# Patient Record
Sex: Male | Born: 1950 | Race: Black or African American | Hispanic: No | Marital: Married | Smoking: Never smoker
Health system: Southern US, Community
[De-identification: ages and names within clinical notes are randomized; demographics above are authoritative.]

## PROBLEM LIST (undated history)

## (undated) DIAGNOSIS — K219 Gastro-esophageal reflux disease without esophagitis: Secondary | ICD-10-CM

## (undated) DIAGNOSIS — I252 Old myocardial infarction: Secondary | ICD-10-CM

## (undated) DIAGNOSIS — F419 Anxiety disorder, unspecified: Secondary | ICD-10-CM

## (undated) DIAGNOSIS — Z8616 Personal history of COVID-19: Secondary | ICD-10-CM

## (undated) DIAGNOSIS — Z923 Personal history of irradiation: Secondary | ICD-10-CM

## (undated) DIAGNOSIS — I5022 Chronic systolic (congestive) heart failure: Secondary | ICD-10-CM

## (undated) DIAGNOSIS — M1990 Unspecified osteoarthritis, unspecified site: Secondary | ICD-10-CM

## (undated) DIAGNOSIS — R441 Visual hallucinations: Secondary | ICD-10-CM

## (undated) DIAGNOSIS — I472 Ventricular tachycardia, unspecified: Secondary | ICD-10-CM

## (undated) DIAGNOSIS — I428 Other cardiomyopathies: Secondary | ICD-10-CM

## (undated) DIAGNOSIS — C61 Malignant neoplasm of prostate: Secondary | ICD-10-CM

## (undated) DIAGNOSIS — Z950 Presence of cardiac pacemaker: Secondary | ICD-10-CM

## (undated) DIAGNOSIS — G4730 Sleep apnea, unspecified: Secondary | ICD-10-CM

## (undated) DIAGNOSIS — E785 Hyperlipidemia, unspecified: Secondary | ICD-10-CM

## (undated) DIAGNOSIS — K635 Polyp of colon: Secondary | ICD-10-CM

## (undated) DIAGNOSIS — I499 Cardiac arrhythmia, unspecified: Secondary | ICD-10-CM

## (undated) DIAGNOSIS — F411 Generalized anxiety disorder: Secondary | ICD-10-CM

## (undated) DIAGNOSIS — Z95810 Presence of automatic (implantable) cardiac defibrillator: Secondary | ICD-10-CM

## (undated) DIAGNOSIS — I10 Essential (primary) hypertension: Secondary | ICD-10-CM

## (undated) HISTORY — DX: Hyperlipidemia, unspecified: E78.5

## (undated) HISTORY — DX: Visual hallucinations: R44.1

## (undated) HISTORY — DX: Gastro-esophageal reflux disease without esophagitis: K21.9

## (undated) HISTORY — DX: Other cardiomyopathies: I42.8

## (undated) HISTORY — DX: Unspecified osteoarthritis, unspecified site: M19.90

## (undated) HISTORY — PX: COLONOSCOPY W/ BIOPSIES AND POLYPECTOMY: SHX1376

## (undated) HISTORY — DX: Generalized anxiety disorder: F41.1

## (undated) HISTORY — DX: Personal history of COVID-19: Z86.16

## (undated) HISTORY — DX: Chronic systolic (congestive) heart failure: I50.22

## (undated) HISTORY — DX: Anxiety disorder, unspecified: F41.9

## (undated) HISTORY — DX: Ventricular tachycardia, unspecified: I47.20

## (undated) HISTORY — DX: Ventricular tachycardia: I47.2

## (undated) HISTORY — DX: Polyp of colon: K63.5

## (undated) HISTORY — PX: UPPER GASTROINTESTINAL ENDOSCOPY: SHX188

## (undated) HISTORY — PX: CATARACT EXTRACTION: SUR2

## (undated) HISTORY — DX: Sleep apnea, unspecified: G47.30

## (undated) HISTORY — PX: RECTAL SURGERY: SHX760

---

## 2003-05-10 ENCOUNTER — Ambulatory Visit: Admission: RE | Admit: 2003-05-10 | Discharge: 2003-05-10 | Payer: Self-pay | Admitting: Urology

## 2003-05-11 ENCOUNTER — Ambulatory Visit (HOSPITAL_COMMUNITY): Admission: RE | Admit: 2003-05-11 | Discharge: 2003-05-11 | Payer: Self-pay | Admitting: Cardiology

## 2003-05-24 ENCOUNTER — Encounter (INDEPENDENT_AMBULATORY_CARE_PROVIDER_SITE_OTHER): Payer: Self-pay | Admitting: Cardiology

## 2003-05-24 ENCOUNTER — Ambulatory Visit: Admission: RE | Admit: 2003-05-24 | Discharge: 2003-05-24 | Payer: Self-pay | Admitting: Cardiology

## 2003-06-12 DIAGNOSIS — C61 Malignant neoplasm of prostate: Secondary | ICD-10-CM

## 2003-06-12 HISTORY — PX: PROSTATECTOMY: SHX69

## 2003-06-12 HISTORY — DX: Malignant neoplasm of prostate: C61

## 2003-06-29 ENCOUNTER — Inpatient Hospital Stay (HOSPITAL_COMMUNITY): Admission: RE | Admit: 2003-06-29 | Discharge: 2003-07-03 | Payer: Self-pay | Admitting: Urology

## 2003-06-29 ENCOUNTER — Encounter (INDEPENDENT_AMBULATORY_CARE_PROVIDER_SITE_OTHER): Payer: Self-pay | Admitting: *Deleted

## 2003-09-27 ENCOUNTER — Inpatient Hospital Stay (HOSPITAL_BASED_OUTPATIENT_CLINIC_OR_DEPARTMENT_OTHER): Admission: RE | Admit: 2003-09-27 | Discharge: 2003-09-27 | Payer: Self-pay | Admitting: Cardiology

## 2004-02-05 ENCOUNTER — Ambulatory Visit (HOSPITAL_BASED_OUTPATIENT_CLINIC_OR_DEPARTMENT_OTHER): Admission: RE | Admit: 2004-02-05 | Discharge: 2004-02-05 | Payer: Self-pay | Admitting: Nephrology

## 2004-03-11 ENCOUNTER — Observation Stay (HOSPITAL_COMMUNITY): Admission: RE | Admit: 2004-03-11 | Discharge: 2004-03-12 | Payer: Self-pay | Admitting: Internal Medicine

## 2004-03-11 ENCOUNTER — Ambulatory Visit: Payer: Self-pay | Admitting: Internal Medicine

## 2004-03-11 HISTORY — PX: CARDIAC DEFIBRILLATOR PLACEMENT: SHX171

## 2004-04-07 ENCOUNTER — Ambulatory Visit: Payer: Self-pay

## 2004-08-01 ENCOUNTER — Ambulatory Visit: Payer: Self-pay | Admitting: Internal Medicine

## 2004-08-20 ENCOUNTER — Ambulatory Visit: Payer: Self-pay | Admitting: Internal Medicine

## 2004-09-05 ENCOUNTER — Ambulatory Visit: Payer: Self-pay | Admitting: Internal Medicine

## 2004-09-05 DIAGNOSIS — K2270 Barrett's esophagus without dysplasia: Secondary | ICD-10-CM

## 2004-09-05 DIAGNOSIS — Z860100 Personal history of colon polyps, unspecified: Secondary | ICD-10-CM | POA: Insufficient documentation

## 2004-09-05 HISTORY — DX: Barrett's esophagus without dysplasia: K22.70

## 2004-09-16 ENCOUNTER — Ambulatory Visit: Payer: Self-pay | Admitting: Internal Medicine

## 2006-08-24 ENCOUNTER — Ambulatory Visit: Payer: Self-pay | Admitting: Internal Medicine

## 2007-01-03 ENCOUNTER — Ambulatory Visit: Payer: Self-pay

## 2007-01-11 ENCOUNTER — Ambulatory Visit: Payer: Self-pay

## 2007-03-02 ENCOUNTER — Ambulatory Visit: Payer: Self-pay

## 2007-03-11 ENCOUNTER — Encounter: Admission: RE | Admit: 2007-03-11 | Discharge: 2007-03-11 | Payer: Self-pay | Admitting: Cardiology

## 2007-03-14 ENCOUNTER — Ambulatory Visit: Payer: Self-pay | Admitting: Internal Medicine

## 2009-08-12 ENCOUNTER — Encounter (INDEPENDENT_AMBULATORY_CARE_PROVIDER_SITE_OTHER): Payer: Self-pay | Admitting: *Deleted

## 2009-08-26 ENCOUNTER — Ambulatory Visit: Payer: Self-pay | Admitting: Internal Medicine

## 2009-08-26 DIAGNOSIS — Z8546 Personal history of malignant neoplasm of prostate: Secondary | ICD-10-CM

## 2009-08-26 DIAGNOSIS — I10 Essential (primary) hypertension: Secondary | ICD-10-CM

## 2009-08-26 DIAGNOSIS — E1169 Type 2 diabetes mellitus with other specified complication: Secondary | ICD-10-CM | POA: Insufficient documentation

## 2009-08-26 DIAGNOSIS — E119 Type 2 diabetes mellitus without complications: Secondary | ICD-10-CM

## 2009-08-26 DIAGNOSIS — Z95810 Presence of automatic (implantable) cardiac defibrillator: Secondary | ICD-10-CM

## 2009-08-26 DIAGNOSIS — I152 Hypertension secondary to endocrine disorders: Secondary | ICD-10-CM | POA: Insufficient documentation

## 2009-08-26 DIAGNOSIS — E785 Hyperlipidemia, unspecified: Secondary | ICD-10-CM | POA: Insufficient documentation

## 2009-08-26 HISTORY — DX: Presence of automatic (implantable) cardiac defibrillator: Z95.810

## 2009-08-26 HISTORY — DX: Essential (primary) hypertension: I10

## 2009-08-26 HISTORY — DX: Type 2 diabetes mellitus without complications: E11.9

## 2009-08-29 ENCOUNTER — Encounter (INDEPENDENT_AMBULATORY_CARE_PROVIDER_SITE_OTHER): Payer: Self-pay | Admitting: *Deleted

## 2009-09-02 ENCOUNTER — Ambulatory Visit: Payer: Self-pay | Admitting: Internal Medicine

## 2009-09-02 ENCOUNTER — Encounter (INDEPENDENT_AMBULATORY_CARE_PROVIDER_SITE_OTHER): Payer: Self-pay | Admitting: *Deleted

## 2009-09-04 ENCOUNTER — Telehealth (INDEPENDENT_AMBULATORY_CARE_PROVIDER_SITE_OTHER): Payer: Self-pay | Admitting: *Deleted

## 2009-09-09 ENCOUNTER — Ambulatory Visit: Payer: Self-pay | Admitting: Internal Medicine

## 2009-09-09 LAB — CONVERTED CEMR LAB
ALT: 19 units/L (ref 0–53)
AST: 15 units/L (ref 0–37)
Albumin: 4 g/dL (ref 3.5–5.2)
Alkaline Phosphatase: 112 units/L (ref 39–117)
BUN: 6 mg/dL (ref 6–23)
Basophils Absolute: 0 10*3/uL (ref 0.0–0.1)
Basophils Relative: 0.3 % (ref 0.0–3.0)
Bilirubin, Direct: 0 mg/dL (ref 0.0–0.3)
CO2: 29 meq/L (ref 19–32)
Calcium: 9 mg/dL (ref 8.4–10.5)
Chloride: 104 meq/L (ref 96–112)
Cholesterol: 94 mg/dL (ref 0–200)
Creatinine, Ser: 0.9 mg/dL (ref 0.4–1.5)
Creatinine,U: 172.9 mg/dL
Digitoxin Lvl: 0.2 ng/mL — ABNORMAL LOW (ref 0.8–2.0)
Eosinophils Absolute: 0.1 10*3/uL (ref 0.0–0.7)
Eosinophils Relative: 1 % (ref 0.0–5.0)
GFR calc non Af Amer: 111.35 mL/min (ref >60)
Glucose, Bld: 105 mg/dL — ABNORMAL HIGH (ref 70–99)
HCT: 36.8 % — ABNORMAL LOW (ref 39.0–52.0)
HDL: 31.7 mg/dL — ABNORMAL LOW (ref >39.00)
Hemoglobin: 12 g/dL — ABNORMAL LOW (ref 13.0–17.0)
Hgb A1c MFr Bld: 6.6 % — ABNORMAL HIGH (ref 4.6–6.5)
LDL Cholesterol: 47 mg/dL (ref 0–99)
Lymphocytes Relative: 27.4 % (ref 12.0–46.0)
Lymphs Abs: 1.9 10*3/uL (ref 0.7–4.0)
MCHC: 32.6 g/dL (ref 30.0–36.0)
MCV: 73.4 fL — ABNORMAL LOW (ref 78.0–100.0)
Microalb Creat Ratio: 9.8 mg/g (ref 0.0–30.0)
Microalb, Ur: 1.7 mg/dL (ref 0.0–1.9)
Monocytes Absolute: 0.5 10*3/uL (ref 0.1–1.0)
Monocytes Relative: 7.8 % (ref 3.0–12.0)
Neutro Abs: 4.4 10*3/uL (ref 1.4–7.7)
Neutrophils Relative %: 63.5 % (ref 43.0–77.0)
Platelets: 241 10*3/uL (ref 150.0–400.0)
Potassium: 3.8 meq/L (ref 3.5–5.1)
RBC: 5.01 M/uL (ref 4.22–5.81)
RDW: 16.2 % — ABNORMAL HIGH (ref 11.5–14.6)
Sodium: 142 meq/L (ref 135–145)
TSH: 1.72 microintl units/mL (ref 0.35–5.50)
Total Bilirubin: 0.5 mg/dL (ref 0.3–1.2)
Total CHOL/HDL Ratio: 3
Total Protein: 7.5 g/dL (ref 6.0–8.3)
Triglycerides: 75 mg/dL (ref 0.0–149.0)
VLDL: 15 mg/dL (ref 0.0–40.0)
WBC: 6.9 10*3/uL (ref 4.5–10.5)

## 2009-09-23 ENCOUNTER — Encounter: Payer: Self-pay | Admitting: Internal Medicine

## 2009-11-14 ENCOUNTER — Telehealth (INDEPENDENT_AMBULATORY_CARE_PROVIDER_SITE_OTHER): Payer: Self-pay | Admitting: *Deleted

## 2009-12-13 ENCOUNTER — Encounter: Payer: Self-pay | Admitting: Internal Medicine

## 2009-12-16 ENCOUNTER — Ambulatory Visit: Payer: Self-pay

## 2010-02-04 ENCOUNTER — Telehealth: Payer: Self-pay | Admitting: Internal Medicine

## 2010-03-24 ENCOUNTER — Encounter: Payer: Self-pay | Admitting: Internal Medicine

## 2010-05-09 ENCOUNTER — Encounter (INDEPENDENT_AMBULATORY_CARE_PROVIDER_SITE_OTHER): Payer: Self-pay | Admitting: *Deleted

## 2010-05-14 ENCOUNTER — Encounter: Payer: Self-pay | Admitting: Internal Medicine

## 2010-06-10 NOTE — Letter (Signed)
Summary: Primary Care Consult Scheduled Letter  [facility] [place]  [address] [place], [place] [address]   Phone: [phone]  Fax: [phone]      08/29/2009 MRN: [id number]  [name] [address] [place], [place]  [address]    Dear [name],    We have scheduled an appointment for you.  At the recommendation of Dr. Marga Melnick, we have scheduled you a consult with Dr. Lewayne Bunting of [facility] on 09-09-2009 at 11:15am.  Their address is [address] 3rd floor, [place] [address]. The office phone number is [phone].  If this appointment day and time is not convenient for you, please feel free to call the office of the doctor you are being referred to at the number listed above and reschedule the appointment.    It is important for you to keep your scheduled appointments. We are here to make sure you are given good patient care.   Thank you,    Renee, Patient Care Coordinator [place] [facility]

## 2010-06-10 NOTE — Progress Notes (Signed)
Summary: Refill Request  Phone Note Refill Request Call back at Home Phone [phone] Call back at [phone] Message from:  Patient  Refills Requested: Medication #1:  ONETOUCH ULTRA TEST  STRP Check bloodsugar's once daily [facility]   Method Requested: Fax to Local Pharmacy Initial call taken by: Shonna Chock,  November 14, 2009 2:15 PM    Prescriptions: ONETOUCH ULTRA TEST  STRP (GLUCOSE BLOOD) Check bloodsugar's once daily  #100 x 3   Entered by:   Shonna Chock   Authorized by:   Marga Melnick MD   Signed by:   Shonna Chock on 11/14/2009   Method used:   Electronically to        [facility] [phone]* (retail)       [address]       [place], [place]  [address]       Ph: [phone]       Fax: [phone]   RxID:   [id number]

## 2010-06-10 NOTE — Letter (Signed)
Summary: New Patient Letter  [facility] [place]  [address] [place], [place] [address]   Phone: [phone]  Fax: [phone]       08/12/2009 MRN: [id number]  [name] [address] [place], [place]  [address]  Dear Mr. Popoca,   Welcome to [facility] and thank you for choosing [place] as your Primary Care Providers. Enclosed you will find information about our practice that we hope you find helpful. We have also enclosed forms to be filled out prior to your visit. This will provide [place] with the necessary information and facilitate your being seen in a timely manner. If you have any questions, please call us at: [phone] and we will be happy to assist you. We look forward to seeing you at your scheduled appointment time.  Appointment Monday, August 26, 2009 at 1:00pm  with Dr. Marga Melnick   Sincerely,  Primary Health Care Team  Please arrive 15 minutes early for your first appointment and bring your insurance card. Co-pay is required at the time of your visit.  *****Please call the office if you are not able to keep this appointment. There is a charge of $50.00 if any appointment is not cancelled or rescheduled within 24 hours*****

## 2010-06-10 NOTE — Progress Notes (Signed)
Summary: refill meds  Phone Note Refill Request Call back at Home Phone [phone] Message from:  Patient on February 04, 2010 3:31 PM  Refills Requested: Medication #1:  DIGOXIN 0.25 MG TABS 1 by mouth [facility] daily. [facility] [address]    Method Requested: Fax to Local Pharmacy Initial call taken by: Lorne Skeens,  February 04, 2010 3:31 PM    Prescriptions: DIGOXIN 0.25 MG TABS (DIGOXIN) 1 by mouth once daily  #30 x 5   Entered by:   Laurance Flatten CMA   Authorized by:   Laren Boom, MD, [facility]   Signed by:   Laurance Flatten CMA on 02/05/2010   Method used:   Electronically to        [facility] [phone]* (retail)       [address]       [place], [place]  [address]       Ph: [phone]       Fax: [phone]   RxID:   [id number]

## 2010-06-10 NOTE — Letter (Signed)
Summary: Device-Delinquent Phone Journalist, newspaper, Main Office  [address]   [place], [place] [address]   Phone: [phone]  Fax: [phone]     December 13, 2009 MRN: [id number]   [name] [address] [place], [place]  [address]   Dear [name],  According to our records, you were scheduled for a device phone transmission on  12-12-2009.     We did not receive any results from this check.  If you transmitted on your scheduled day, please call us to help troubleshoot your system.  If you forgot to send your transmission, please send one upon receipt of this letter.  Thank you,   Architectural technologist Device Clinic

## 2010-06-10 NOTE — Assessment & Plan Note (Signed)
Summary: F2Y/REESTABLISHED   Visit Type:  Follow-up   History of Present Illness: [name] returns today for followup.  He is a pleasant 60 yo man with a h/o DCM, CHF, s/p ICD implant who returns for followup.  he has not been to our ICD clinic for 3 yrs.  He denies c/p, sob, or peripheral edema.  He has had no intercurrent ICD shocks.    Current Medications (verified): 1)  Ramipril 10 Mg Caps (Ramipril) .... Take 1 Tab Two Times A Day 2)  Coreg Cr 80 Mg Xr24h-Cap (Carvedilol Phosphate) .... Take 1 Tab Once Daily 3)  Onetouch Delica Lancets  Misc (Lancets) .... Check Bloodsugar's Once Daily 4)  Onetouch Ultra Test  Strp (Glucose Blood) .... Check Bloodsugar's Once Daily 5)  Janumet 50-1000 Mg Tabs (Sitagliptin-Metformin Hcl) .[name].. 1 By Mouth Two Times A Day With Food 6)  Lipitor 20 Mg Tabs (Atorvastatin Calcium) .[name].. 1 By Mouth At Bedtime Except 1/2 Tues, Th & Sat 7)  Digoxin 0.25 Mg Tabs (Digoxin) .[name].. 1 By Mouth Once Daily  Allergies (verified): No Known Drug Allergies  Past History:  Past Medical History: Last updated: 08/26/2009 Defibrillator Implant 2005 for ? Cardiomyopathy noted pre Prostatectomy Prostate cancer, hx of, Dr Su Grand Diabetes mellitus, type II Hyperlipidemia Hypertension Sleep Apnea, CPAP  Past Surgical History: Last updated: 08/26/2009 Defibrillator, Dr Sharrell Ku 2005 Prostatectomy Colonoscopy negative @ age 36  Family History: Last updated: 08/26/2009 Father: CAD, ? 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    Vital Signs:  Patient profile:   60 year old male Height:      71 inches Weight:      230 pounds BMI:     32.19 Pulse rate:   82 / minute BP sitting:   120 / 90  (left  arm)  Vitals Entered By: Laurance Flatten CMA (Sep 09, 2009 11:28 AM)  Physical Exam  General:  well-nourished; alert,appropriate and cooperative throughout examination Head:  Normocephalic and atraumatic without obvious abnormalities. Alopecia ; moustache Mouth:  Oral mucosa and oropharynx without lesions or exudates.  Teeth in good repair. Neck:  No deformities, masses, or tenderness noted. Chest Wall:  Defibrillator L chest Lungs:  Normal respiratory effort, chest expands symmetrically. Lungs are clear to auscultation, no crackles or wheezes. Heart:  normal rate, regular rhythm, no gallop, no rub, no JVD, no HJR, and grade 1 /6 systolic murmur @ L base.  Rare premature Abdomen:  Bowel sounds positive,abdomen soft and non-tender without masses, organomegaly or hernias noted. Msk:  No deformity or scoliosis noted of thoracic or lumbar spine.   Pulses:  R and L carotid,radial,dorsalis pedis and posterior tibial pulses are full and equal bilaterally Extremities:  No clubbing, cyanosis, edema, or deformity noted with normal full range of motion of all joints. Crepitus of knees . Discoloration of R  great toenail with ingrown nail  Neurologic:  alert & oriented X3, sensation intact to light touch over feet, and DTRs symmetrical and normal.      ICD Specifications Following MD:  Lewayne Bunting, MD      ICD Follow Up Remote Check?  No Battery Voltage:  3.03 V     Charge Time:  9.87 seconds     Underlying rhythm:  SR  ICD Device Measurements Right Ventricle:  Amplitude: 7.2 mV, Impedance: 424 ohms, Threshold: 1.0 V at 0.3 msec Shock Impedance: 43/56 ohms   Episodes MS Episodes:  0     Percent Mode Switch:  0     Shock:  0     ATP:  0     Nonsustained:  270     Atrial Therapies:  0 Ventricular Pacing:  <0.1%  Brady Parameters Mode VVI     Lower Rate Limit:  40      Tachy Zones VF:  207-500     VT:  176-207     VT1:  FVT VIA VF 207-250     Next Cardiology Appt Due:  12/12/2009 Tech  Comments:  270 NST EPISODES-LONGEST WAS 8 BEATS.  NORMAL DEVICE FUNCTION. ROV W/CARELINK .  Vella Kohler MD Comments:  Normal device function.    Impression & Recommendations:  Problem # 1:  AUTOMATIC IMPLANTABLE CARDIAC DEFIBRILLATOR SITU (ICD-V45.02) His device is working normally. Will see him back in several months.  Problem # 2:  HYPERTENSION (ICD-401.9) His blood pressure is well controlled.  Will recheck in several months.  A low sodium diet is recommended. His updated medication list for this problem includes:    Ramipril 10 Mg Caps (Ramipril) .[name]... Take 1 tab two times a day    Coreg Cr 80 Mg Xr24h-cap (Carvedilol phosphate) .[name]... Take 1 tab once daily  Problem # 3:  HYPERLIPIDEMIA (ICD-272.4) I discussed the importance of a low fat diet and regular exercise. His updated medication list for this problem includes:    Lipitor 20 Mg Tabs (Atorvastatin calcium) .[name]... 1 by mouth at bedtime except 1/2 tues, th & sat  Patient Instructions: 1)  Your physician recommends that you schedule a follow-up appointment in: 6 months with Dr Ladona Ridgel

## 2010-06-10 NOTE — Consult Note (Signed)
Summary: [facility]   Imported By: Lanelle Bal 10/01/2009 12:54:11  _____________________________________________________________________  External Attachment:    Type:   Image     Comment:   External Document

## 2010-06-10 NOTE — Assessment & Plan Note (Signed)
Summary: NEW TO EST,[employer] INS/RH......[name]   Vital Signs:  Patient profile:   60 year old male Height:      71 inches Weight:      236 pounds BMI:     33.03 Temp:     98.6 degrees F oral Pulse rate:   80 / minute Resp:     18 per minute BP sitting:   130 / 72  (left arm)  Vitals Entered By: Jeremy Johann CMA (August 26, 2009 1:09 PM)   History of Present Illness: [name] is here to establish as a new patient;he needs to establish with a new Cardiologist as his prior MD does not participate with his plan, [employer].  Preventive Screening-Counseling & Management  Alcohol-Tobacco     Smoking Status: never  Caffeine-Diet-Exercise     Does Patient Exercise: no  Allergies (verified): No Known Drug Allergies  Past History:  Past Medical History: Defibrillator Implant 2005 for ? Cardiomyopathy noted pre Prostatectomy Prostate cancer, hx of, Dr Su Grand Diabetes mellitus, type II Hyperlipidemia Hypertension Sleep Apnea, CPAP  Past Surgical History: Defibrillator, Dr Sharrell Ku 2005 Prostatectomy Colonoscopy negative @ age 28  Family History: Father: CAD, ? 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 Eyes:  Complains of blurring; denies double vision and vision loss-both eyes; Last exam 2010; no retinopathy. Early cataract present.. ENT:  Denies difficulty swallowing and hoarseness. CV:  Denies bluish discoloration of lips or nails, chest pain or discomfort, difficulty breathing at night, difficulty breathing while lying down, leg cramps with exertion, near fainting, palpitations, shortness of breath with exertion, swelling of feet, and swelling of hands; Occasional lightheadedness, ? low glucose. Resp:   Complains of excessive snoring; denies hypersomnolence and morning headaches. GI:  Denies abdominal pain, change in bowel habits, dark tarry stools, and indigestion. GU:  Denies discharge, dysuria, and hematuria; Dr Brunilda Payor did PSA today. MS:  Complains of joint pain and low back pain; denies joint redness, joint swelling, mid back pain, and thoracic pain; Knees & R hip pain; no Rx. Derm:  Denies changes in nail beds, dryness, hair loss, and poor wound healing. Neuro:  Denies numbness and tingling; Burning in feet intermittently in feet. Psych:  Denies anxiety and depression. Endo:  Denies cold intolerance, excessive hunger, excessive thirst, excessive urination, and heat intolerance; FBS not checked for a while.. Heme:  Denies abnormal bruising and bleeding. Allergy:  Denies itching eyes, seasonal allergies, and sneezing.  Physical Exam  General:  well-nourished; alert,appropriate and cooperative throughout examination Head:  Normocephalic and atraumatic without obvious abnormalities. Alopecia ; moustache Eyes:  No corneal or conjunctival inflammation noted.Perrla. Funduscopic exam benign, without hemorrhages, exudates or papilledema. Arteriolar narrowing Ears:  External ear exam shows no significant lesions or deformities.  Otoscopic examination reveals clear canals, tympanic membranes are intact bilaterally without bulging, retraction, inflammation or discharge. Hearing is grossly normal bilaterally. Nose:  External nasal examination shows no deformity or inflammation. Nasal mucosa are pink and moist without lesions or exudates. Mouth:  Oral mucosa and oropharynx without lesions or exudates.  Teeth in good repair. Neck:  No deformities, masses, or tenderness noted. Chest Wall:  Defibrillator L chest Lungs:  Normal respiratory effort, chest expands symmetrically. Lungs are clear to auscultation, no crackles or wheezes. Heart:  normal rate, regular rhythm, no gallop, no rub, no JVD, no HJR, and  grade 1 /6 systolic murmur @ L base.  Rare premature Abdomen:  Bowel sounds positive,abdomen soft and non-tender without masses, organomegaly or hernias noted. Genitalia:  Dr Brunilda Payor Msk:  No deformity or scoliosis noted of thoracic or lumbar spine.   Pulses:  R and L carotid,radial,dorsalis pedis and posterior tibial pulses are full and equal bilaterally Extremities:  No clubbing, cyanosis, edema, or deformity noted with normal full range of motion of all joints. Crepitus of knees . Discoloration of R  great toenail with ingrown nail  Neurologic:  alert & oriented X3, sensation intact to light touch over feet, and DTRs symmetrical and normal.   Skin:  Intact without suspicious lesions or rashes Cervical Nodes:  No lymphadenopathy noted Axillary Nodes:  No palpable lymphadenopathy Psych:  memory intact for recent and remote, normally interactive, and good eye contact.     Impression & Recommendations:  Problem # 1:  ROUTINE GENERAL MEDICAL EXAM@HEALTH  CARE FACL (ICD-V70.0)  Orders: EKG w/ Interpretation ([address]) Podiatry Referral (Podiatry)  Problem # 2:  DIABETES MELLITUS, TYPE II (ICD-250.00)  His updated medication list for this problem includes:    Ramipril 10 Mg Caps (Ramipril) .[name]... Take 1 tab two times a day  Orders: Podiatry Referral (Podiatry)  Problem # 3:  HYPERLIPIDEMIA (ICD-272.4)  Problem # 4:  HYPERTENSION (ICD-401.9)  controlled His updated medication list for this problem includes:    Ramipril 10 Mg Caps (Ramipril) .[name]... Take 1 tab two times a day    Coreg Cr 80 Mg Xr24h-cap (Carvedilol phosphate) .[name]... Take 1 tab once daily  Orders: EKG w/ Interpretation ([address])  Problem # 5:  PROSTATE CANCER, HX OF (ICD-V10.46) as per Dr Brunilda Payor  Problem # 6:  AUTOMATIC IMPLANTABLE CARDIAC DEFIBRILLATOR SITU (ICD-V45.02)  as per Dr Sharrell Ku  Orders: EKG w/ Interpretation ([address]) Cardiology Referral (Cardiology)  Complete Medication List: 1)  Ramipril 10 Mg Caps  (Ramipril) .... Take 1 tab two times a day 2)  Coreg Cr 80 Mg Xr24h-cap (Carvedilol phosphate) .... Take 1 tab once daily 3)  Onetouch Delica Lancets Misc (Lancets) .... Check bloodsugar's once daily 4)  Onetouch Ultra Test Strp (Glucose blood) .... Check bloodsugar's once daily  Patient Instructions: 1)  Please schedule fasting labs: 2)  BMP ; 3)  Hepatic Panel ; 4)  Lipid Panel ; 5)  TSH ; 6)  CBC w/ Diff; 7)  HbgA1C ; 8)  Urine Microalbumin; Digoxin level . See Diagnoses for Codes. 9)  Check your blood sugars regularly. If your readings are usually above :150 or below90  OR > 180 two hours after any meal you should contact our office. 10)  See your eye doctor yearly to check for diabetic eye damage. 11)  Check your feet each night for sore areas, calluses or signs of infection. Prescriptions: ONETOUCH ULTRA TEST  STRP (GLUCOSE BLOOD) Check bloodsugar's once daily  #100 x 3   Entered by:   Shonna Chock   Authorized by:   Marga Melnick MD   Signed by:   Shonna Chock on 08/26/2009   Method used:   Print then Give to Patient   RxID:   [id number] [facility] DELICA LANCETS  MISC (LANCETS) Check bloodsugar's once daily  #100 x 3   Entered by:   Shonna Chock   Authorized by:   Marga Melnick MD   Signed by:   Shonna Chock on 08/26/2009   Method used:  Print then Give to Patient   RxID:   [id number]

## 2010-06-10 NOTE — Progress Notes (Signed)
Summary: ADD TO LIST  Phone Note Call from Patient   Reason for Call: Talk to Nurse Summary of Call: PATIENT LEFT MESSAGE ABOUT HIS MED HE IS ON -- JANUMET50--1000--TAKE 22M TABLET TWICE A DAY WITH FOOD, LIPITOR 20 MG -TAKE ONE BY MOUTH EVERYDAY, RAMIPRIL 10 MG  TAKE 1 TWICE  DAILY, COREG CR 80 MG -TAKE 1 CAP EVERYDAY WITH FOOD,DIGOXIN 250MG  TAKE 1 BY MOUTH EVERY DAY. COULD YOU PLEASE ADD THIS TO HIS LIST. THANKS Initial call taken by: Freddy Jaksch,  September 04, 2009 9:54 AM  Follow-up for Phone Call        Med list updated Follow-up by: Shonna Chock,  September 04, 2009 2:35 PM    New/Updated Medications: JANUMET 50-1000 MG TABS (SITAGLIPTIN-METFORMIN HCL) 1 by mouth two times a day with food LIPITOR 20 MG TABS (ATORVASTATIN CALCIUM) 1 by mouth at bedtime DIGOXIN 0.25 MG TABS (DIGOXIN) 1 by mouth once daily

## 2010-06-10 NOTE — Assessment & Plan Note (Signed)
Summary: rov/ gd   Current Medications (verified): 1)  Ramipril 10 Mg Caps (Ramipril) .... Take 1 Tab Two Times A Day 2)  Coreg Cr 80 Mg Xr24h-Cap (Carvedilol Phosphate) .... Take 1 Tab Once Daily 3)  Onetouch Delica Lancets  Misc (Lancets) .... Check Bloodsugar's Once Daily 4)  Onetouch Ultra Test  Strp (Glucose Blood) .... Check Bloodsugar's Once Daily 5)  Janumet 50-1000 Mg Tabs (Sitagliptin-Metformin Hcl) .[name].. 1 By Mouth Two Times A Day With Food 6)  Lipitor 20 Mg Tabs (Atorvastatin Calcium) .[name].. 1 By Mouth At Bedtime Except 1/2 Tues, Th & Sat 7)  Digoxin 0.25 Mg Tabs (Digoxin) .[name].. 1 By Mouth Once Daily  Allergies (verified): No Known Drug Allergies    ICD Specifications Following MD:  Lewayne Bunting, MD     ICD Vendor:  [employer]     ICD Model Number:  7232     ICD Serial Number:  [id number] ICD DOI:  03/11/2004     ICD Implanting MD:  Lewayne Bunting, MD  Lead 1:    Location: RV     DOI: 03/11/2004     Model #: 4098     Serial #: [id number]     Status: active  ICD Follow Up Battery Voltage:  3.00 V     Charge Time:  10.04 seconds     Underlying rhythm:  SR   ICD Device Measurements Right Ventricle:  Amplitude: 7.3 mV, Impedance: 376 ohms, Threshold: 1.0 V at 0.3 msec Shock Impedance: 45/58 ohms   Episodes MS Episodes:  0     Shock:  0     ATP:  0     Nonsustained:  0     Atrial Therapies:  0 Ventricular Pacing:  <0.1%  Brady Parameters Mode VVI     Lower Rate Limit:  40      Tachy Zones VF:  207-500     VT:  176-207     VT1:  FVT VIA VF 207-250     Next Remote Date:  03/20/2010     Tech Comments:  35 NST EPISODES--LONGEST WAS 7 BEATS.  NORMAL DEVICE FUNCTION.  5621 LEAD STABLE.  SIC 0.  LETTER AND MAGNET GIVEN TO PT.  DEMONSTRATED TONES FOR PT.  ORDERED PT NEW CARELINK TRANSMITTER.  PT UNABLE TO FIND OLD TRANSMITTER.  CARELINK 03-20-10. Vella Kohler  December 16, 2009 3:45 PM

## 2010-06-10 NOTE — Letter (Signed)
Summary: Device-Delinquent Phone Journalist, newspaper, Main Office  [address]   [place], [place] [address]   Phone: [phone]  Fax: [phone]     March 24, 2010 MRN: [id number]   [name] [address] [place], [place]  [address]   Dear [name],  According to our records, you were scheduled for a device phone transmission on  03-20-2010.     We did not receive any results from this check.  If you transmitted on your scheduled day, please call us to help troubleshoot your system.  If you forgot to send your transmission, please send one upon receipt of this letter.  Thank you,   Architectural technologist Device Clinic

## 2010-06-10 NOTE — Letter (Signed)
Summary: Colonoscopy-Changed to Office Visit Letter  [facility]  [address] [place], [place] [address]   Phone: [phone]  Fax: [phone]      September 02, 2009 MRN: [id number]   [name] [address] [place], [place]  [address]   Dear [name],   According to our records, it is time for you to schedule a Colonoscopy. However, after reviewing your medical record, I feel that an office visit would be most appropriate to more completely evaluate you and determine your need for a repeat procedure.  Please call [phone] (option #2) at your convenience to schedule an office visit. If you have any questions, concerns, or feel that this letter is in error, we would appreciate your call.   Sincerely,  Iva Boop, M.D.  [facility] [phone]

## 2010-06-12 NOTE — Letter (Signed)
Summary: Device-Delinquent Phone Journalist, newspaper, Main Office  [address]   [place], [place] [address]   Phone: [phone]  Fax: [phone]     May 09, 2010 MRN: [id number]   [name] [address] [place], [place]  [address]   Dear Mr. Moret,  According to our records, you were scheduled for a device phone transmission on 03-24-2010.     We did not receive any results from this check.  If you transmitted on your scheduled day, please call us to help troubleshoot your system.  If you forgot to send your transmission, please send one upon receipt of this letter.  Thank you,  Vella Kohler  May 09, 2010 1:56 PM   [facility] certified

## 2010-06-12 NOTE — Cardiovascular Report (Signed)
Summary: Certified Letter Signed - Patient (no response, no appt)  Certified Letter Signed - Patient (no response, no appt)   Imported By: Debby Freiberg 05/28/2010 16:41:30  _____________________________________________________________________  External Attachment:    Type:   Image     Comment:   External Document

## 2010-06-15 ENCOUNTER — Encounter: Payer: Self-pay | Admitting: Internal Medicine

## 2010-06-16 ENCOUNTER — Encounter (INDEPENDENT_AMBULATORY_CARE_PROVIDER_SITE_OTHER): Payer: Managed Care, Other (non HMO)

## 2010-06-16 DIAGNOSIS — I428 Other cardiomyopathies: Secondary | ICD-10-CM

## 2010-06-19 ENCOUNTER — Telehealth: Payer: Self-pay | Admitting: Internal Medicine

## 2010-06-20 ENCOUNTER — Encounter (INDEPENDENT_AMBULATORY_CARE_PROVIDER_SITE_OTHER): Payer: Self-pay | Admitting: *Deleted

## 2010-06-26 NOTE — Letter (Signed)
Summary: Remote Device Check  [employer], Main Office  [address]   [place], [place] [address]   Phone: [phone]  Fax: [phone]     June 20, 2010 MRN: [id number]   [name] [address] [place], [place]  [address]   Dear Mr. Lepage,   Your remote transmission was recieved and reviewed by your physician.  All diagnostics were within normal limits for you.  _____Your next transmission is scheduled for:                       .  Please transmit at any time this day.  If you have a wireless device your transmission will be sent automatically.  __X____Your next office visit is scheduled for:  May 2012 with Dr. Ladona Ridgel.                            . Please call our office to schedule an appointment.    Sincerely,  Altha Harm, LPN

## 2010-06-26 NOTE — Progress Notes (Signed)
Summary: DID WE GET PT TRANSMISSION  Phone Note Call from Patient Call back at [phone]   Caller: Patient Reason for Call: Talk to Nurse, Talk to Doctor Summary of Call: PT WANTS TO MAKE SURE HIS TRANSMISSION WENT THROUGH BECAUSE HE GOT A LETTER THAT SAID WE DIDN'T GET ONE Initial call taken by: Omer Jack,  June 19, 2010 4:08 PM  Follow-up for Phone Call        transmission received on 06-15-10.  doctor to review and letter to be sent in mail. Vella Kohler  June 20, 2010 5:08 PM

## 2010-07-02 NOTE — Cardiovascular Report (Signed)
Summary: Office Visit Remote   Office Visit Remote   Imported By: Roderic Ovens 06/23/2010 10:07:02  _____________________________________________________________________  External Attachment:    Type:   Image     Comment:   External Document

## 2010-08-28 ENCOUNTER — Other Ambulatory Visit: Payer: Self-pay | Admitting: *Deleted

## 2010-08-28 MED ORDER — DIGOXIN 250 MCG PO TABS: 250.0000 ug | ORAL_TABLET | Freq: Every day | ORAL | Status: DC

## 2010-08-30 ENCOUNTER — Other Ambulatory Visit: Payer: Self-pay | Admitting: *Deleted

## 2010-08-30 MED ORDER — DIGOXIN 250 MCG PO TABS: 250.0000 ug | ORAL_TABLET | Freq: Every day | ORAL | Status: DC

## 2010-09-16 ENCOUNTER — Other Ambulatory Visit: Payer: Self-pay

## 2010-09-16 MED ORDER — RAMIPRIL 10 MG PO CAPS: 10.0000 mg | ORAL_CAPSULE | Freq: Two times a day (BID) | ORAL | Status: DC

## 2010-09-16 NOTE — Telephone Encounter (Signed)
Patient needs to scheduled CPX

## 2010-09-18 ENCOUNTER — Other Ambulatory Visit: Payer: Self-pay | Admitting: *Deleted

## 2010-09-18 MED ORDER — DIGOXIN 250 MCG PO TABS: 250.0000 ug | ORAL_TABLET | Freq: Every day | ORAL | Status: DC

## 2010-09-23 NOTE — Assessment & Plan Note (Signed)
[facility]                         ELECTROPHYSIOLOGY OFFICE NOTE   NAME:PLATTTryton, Bodi                       MRN:          [id number]  DATE:01/03/2007                            DOB:          1951-04-16    Mr. Sellin was seen today in the clinic on the 25th of August, 2008 for  followup of his Medtronic, model [id number], Maximo.  Date of implant was  March 11, 2004 for ischemic cardiomyopathy.   On interrogation of his device today, his battery voltage is 3.13 with a  charge time of 9.73 seconds.  R waves measured 6.7 mV with a ventricular  capture threshold of 1volt at 0.3 ms and a ventricular lead impedance of  376 ohm.  Shock impedance was 43.  There were 9 nonsustained episodes  since the last interrogation.  He has a 69-49 alert lead that was  reprogrammed according to protocol.  His SIC counter was 0.  He will  send [facility] transmissions with a return office visit in February,  2009.      Altha Harm, LPN  Electronically Signed      Doylene Canning. Ladona Ridgel, MD  Electronically Signed   PO/MedQ  DD: 01/03/2007  DT: 01/03/2007  Job #: [id number]

## 2010-09-26 NOTE — Procedures (Signed)
[name], [name]                ACCOUNT NO.:  [id number]   MEDICAL RECORD NO.:  [id number]          PATIENT TYPE:  OUT   LOCATION:  SLEEP CENTER                 FACILITY:  [facility]   PHYSICIAN:  Clinton D. Maple Hudson, M.D. DATE OF BIRTH:  Mar 24, 1951   DATE OF STUDY:  02/05/2004  DATE OF DISCHARGE:  02/05/2004                              NOCTURNAL POLYSOMNOGRAM   REFERRING PHYSICIAN:  Dr. Jeri Cos   INDICATION FOR STUDY:  Hypersomnia with sleep apnea.  History of abnormal  heart rhythm during sleep.  Epworth sleepiness score 21/24.  BMI 32.8.  Weight 235 pounds.   MEDICATIONS:  1.  Altace.  2.  Coreg.  3.  Lipitor.  4.  Metformin.   SLEEP ARCHITECTURE:  Total sleep time 320 minutes with sleep efficiency 83%.  Stage I was 9%, stage II 77%, stages III and IV were absent.  REM was 14% of  total sleep time.  Sleep latency was 4.5 minutes.  REM latency was 83  minutes.  Awake after sleep onset 62 minutes.  Arousal index 18.   RESPIRATORY DATA:  Split study protocol.  RDI 36.8 indicating moderately  severe obstructive sleep apnea/hypopnea syndrome before CPAP titration.  This included 52 obstructive apneas, 4 central apneas, and 64 hypopneas.  Most events were while lying supine.  REM RDI was 6.7.  CPAP was titrated to  7 CWP, RDI 0/hour using a Resmed Ultra Mirage full face mask, size not  specified.   OXYGEN DATA:  Very loud snoring with desaturation to a nadir of 77% before  CPAP control.  After CPAP titration oxygen saturation held 96% on room air.   CARDIAC DATA:  Occasional PACs and an occasional skipped beat.  More  significant pathologic rhythm not noted.  Rhythm was unremarkable after CPAP  titration, but still with an occasional PAC and PVC.   MOVEMENT/PARASOMNIA:  Occasional leg jerks with insignificant impact on  sleep.   IMPRESSION/RECOMMENDATION:  Moderately severe obstructive sleep  apnea/hypopnea syndrome, RDI 36.8/hour with desaturation to 77%.  Successful  CPAP titration to 7 CWP, RDI 0/hour using a Resmed Ultra Mirage full face  mask.  Frequent PACs and PVCs.      CDY/MEDQ  D:  02/10/2004 13:02:33  T:  02/11/2004 08:19:36  Job:  [id number]

## 2010-09-26 NOTE — Op Note (Signed)
NAMEVIRL, COBLE                ACCOUNT NO.:  [id number]   MEDICAL RECORD NO.:  [id number]          PATIENT TYPE:  INP   LOCATION:  4735                         FACILITY:  [facility]   PHYSICIAN:  Doylene Canning. Ladona Ridgel, M.D.  DATE OF BIRTH:  1951-04-21   DATE OF PROCEDURE:  03/11/2004  DATE OF DISCHARGE:                                 OPERATIVE REPORT   PROCEDURE:  Implantation of a single chamber defibrillator.   SURGEON:   INDICATIONS FOR PROCEDURE:  Ischemic cardiomyopathy with prior myocardial  infarction, congestive heart failure class II and ejection fraction of 28%.   INTRODUCTION:  The patient is a 60 year old man status post myocardial  infarction several years ago with severe LV dysfunction and an ejection  fraction of 28% by catheterization.  The patient has a history of class II  congestive heart failure and is now referred for ICD implantation.   DESCRIPTION OF PROCEDURE:  After informed consent was obtained, the patient  was taken to the diagnostic EP lab in the fasting state.  After the usual  preparation and draping, intravenous fentanyl and midazolam was given for  sedation.  A total of 30 mL of lidocaine was infiltrated into the left  infraclavicular region.  A 9 cm incision was carried out over this region  and electrocautery utilized to dissect down to the fascial plane.  There  were 10 mL of contrast injected into the left upper extremity venous system  and demonstrated a patent left subclavian vein.  It was subsequently  punctured and the Medtronic model 6959 65 cm active fixation pacing lead,  serial number [id number] was advanced into the right ventricle.  Mapping was  carried out in the right ventricle at the final site.  The R-wave was  measured 10 mV initially through the device with the lead actively fixed.  The initial pacing threshold was 1 volt of 0.5 msec.  A 10 volt pacing did  not stimulate the diaphragm.  Pacing impedances were within satisfactory  levels.  With the lead in satisfactory position, it was secured to the  subpectoralis fascia with figure-of-eight silk suture. Sewing sleeve was  also secured with silk suture.  Electrocautery was utilized to make a  subcutaneous pocket.  Kanamycin irrigation was utilized to irrigate the  pocket.  The Medtronic Maximal model 7232 single chamber defibrillator,  serial number [id number] was then connected to the defibrillation lead and  placed in the subcutaneous pocket.  The generator was secured with silk  suture.  The patient was more deeply sedated and defibrillation threshold  testing carried out.   After the patient was deeply sedated with fentanyl and Versed, VF was  induced with a T-wave shock.  A 15 joule shock was initially delivered which  terminated ventricular fibrillation, restored sinus rhythm.  Five minutes  was allowed to elapse and a second DFT test carried out.  Again VF was  induced with a T-wave shock.  This time, a 10 joule shock was delivered  which failed to terminate ventricular fibrillation.  The device redetected  and recharged and a 20 joule shock  was then delivered which also failed to  terminate ventricular fibrillation.  A 360 joule external shock was then  delivered restoring sinus rhythm.  Five additional minutes was allowed to  elapse and a third DFT test carried out.  Again, VF was induced with a T-  wave shock and this time a 25 joule shock was delivered which terminated the  ventricular fibrillation and restored sinus rhythm.  At this point, no  additional defibrillation threshold testing was carried out and the incision  was closed with a layer of 2-0 Vicryl followed by a layer of 3-0 Vicryl,  followed by a layer of 4-0 Vicryl.  Benzoin was painted on the skin.  Steri-  Strips were applied.  Pressure dressing placed.  The patient returned to his  room in satisfactory condition.   COMPLICATIONS:  There were no immediate procedure complications.    RESULTS:  This demonstrated successful implantation of a Medtronic single  chamber defibrillator in a patient with an ischemic cardiomyopathy, status  post myocardial infarction, ejection fraction of 28%, class II heart  failure.       GWT/MEDQ  D:  03/11/2004  T:  03/11/2004  Job:  [id number]   cc:   Eduardo Osier. Sharyn Lull, M.D.  [address]  [place]  [place] [address]  Fax: [phone]

## 2010-09-26 NOTE — Discharge Summary (Signed)
NAME:  [name], [name]                          ACCOUNT NO.:  [id number]   MEDICAL RECORD NO.:  [id number]                   PATIENT TYPE:  INP   LOCATION:  0383                                 FACILITY:  [facility]   PHYSICIAN:  Lindaann Slough, M.D.               DATE OF BIRTH:  1950-06-01   DATE OF ADMISSION:  06/29/2003  DATE OF DISCHARGE:  07/03/2003                                 DISCHARGE SUMMARY   DISCHARGE DIAGNOSES:  1. Adenocarcinoma of prostate.  2. Hypertension.  3. Diabetes.  4. Hypercholesterolemia.   PROCEDURES DONE:  Bilateral pelvic lymphadenectomy and radical prostatectomy  on June 29, 2003.   HISTORY OF PRESENT ILLNESS:  This is a 60 year old male who underwent  prostate biopsy for adenocarcinoma of prostate, Gleason score of 6.  His PSA  was 4.06 in August 2004.  Treatment options were discussed with the patient  and his wife, and they chose to have a radical prostatectomy.   PHYSICAL EXAMINATION:  VITAL SIGNS:  Blood pressure was 120/80, pulse 68,  respirations 18, temperature 98, and weight of 244 pounds.  HEENT:  His head is normal.  Pupils equal and reactive to light and  accommodation.  Ears, nose, and throat within normal limits.  NECK:  Supple, no cervical lymph nodes, no thyromegaly.  CHEST:  Symmetrical.  LUNGS:  Fully expanded and clear to auscultation and percussion.  HEART:  Regular rhythm.  No murmurs, rubs, or gallops.  ABDOMEN:  Soft, protuberant, nontender, no hepatomegaly, no splenomegaly.  GENITOURINARY:  Penis is uncircumcised.  The meatus is normal.  Scrotum is  unremarkable.  Both testicles, cords, and epididymis are within normal  limits.  RECTAL:  Sphincter tone is normal.  Prostate is enlarged, no nodules.  Seminal vesicles are not palpable.   LABORATORY DATA:  His hemoglobin on admission was 13, hematocrit 40.2 and  WBCs 6.9.  Sodium 139, potassium 3.9, BUN 12, creatinine 1.2, glucose 141.  Urinalysis showed trace of ketones.  His  urine was sterile.   Chest x-ray showed no evidence of active disease.  The patient was seen by  Dr. Sharyn Lull prior to surgery and he was cleared from a cardiac standpoint  for surgery.   HOSPITAL COURSE:  The patient had bilateral pelvic lymphadenectomy and  radical prostatectomy.  Postoperatively, his dropped his hemoglobin to 7.6.  He was given 2 units of packed red blood cells and his hemoglobin went up to  9.4 with hematocrit of 28.5.  He was complaining of bladder spasms and also  had difficulty having bowel movement, but he was passing flatus.  He  remained afebrile throughout his hospital course and the [facility] drain was  removed on July 03, 2003.  His abdomen was slightly distended, but no  more than prior to surgery.   Pathology report showed ___________ margins at the apex, inferior and  posterior with right posterior resection margins.  The patient  was then  discharged home on July 03, 2003, on Vicodin one or two tablets q.4h.  p.r.n. for pain, Cipro one b.i.d., Colace 100 mg t.i.d., Ditropan 5 mg  t.i.d., Coreg 6.25 mg b.i.d., Altace 5 mg daily, and Lipitor 20 mg daily.   The patient is instructed not to do any lifting, straining, or driving until  further advised.   DISCHARGE DIET:  Regular.   CONDITION ON DISCHARGE:  Improved.   PLAN:  To remove Foley catheter in two weeks, and then obtain PSA in six  weeks.  If his PSA is not undetectable, the patient will be treated with  radiation external radiotherapy.                                               Lindaann Slough, M.D.    MN/MEDQ  D:  07/03/2003  T:  07/03/2003  Job:  [id number]   cc:   Jarome Matin, M.D.  [address] [place]  [place] [address]  Fax: [phone]   Eduardo Osier. Sharyn Lull, M.D.  [address]  [place]  [place] [address]  Fax: [phone]

## 2010-09-26 NOTE — Assessment & Plan Note (Signed)
[facility]                         ELECTROPHYSIOLOGY OFFICE NOTE   NAME:PLATTDyshawn, Cangelosi                       MRN:          [id number]  DATE:08/24/2006                            DOB:          1950/08/23    [name] returns today for followup.  He is a very pleasant middle-aged  man with nonischemic cardiomyopathy and congestive heart failure, who is  status post ICD implantation.  He returns today for followup.  He  continued to have relatively asymptomatic nonsustained VT but has had no  ICD therapies since we last saw him in the office over a year ago.  He  denies chest pain or shortness of breath.  He denies peripheral edema.  He admits to being inactive with regard to physical activity.   PHYSICAL EXAMINATION:  GENERAL:  He is a pleasant, well-appearing middle-  aged man in no distress.  VITAL SIGNS:  Blood pressure 119/74, pulse 74 and regular.  Weight was  241 pounds.  NECK:  No jugular venous distention.  LUNGS:  Clear bilaterally to auscultation.  No wheezes, rales, or  rhonchi.  CARDIOVASCULAR:  Regular rate and rhythm with normal S1 and S2.  There  is a soft S4 gallop present.  EXTREMITIES:  No clubbing, cyanosis or edema.   Interrogation of his defibrillator demonstrates a Medtronic Maximo with  R waves of 6 mV, and impedence of 408 ohm, and a threshold of 0.3 ms.  Battery voltage was 3.14 volts.  There are no intercurrent IC therapies.   IMPRESSION:  1. Nonischemic cardiomyopathy.  2. Congestive heart failure.  3. Diabetes.  4. Status post implantable cardioverter/defibrillator (ICD) insertion.   DISCUSSION:  Overall, [name] is stable.  I recommended that he can  start an exercise program of walking on a daily basis.  I will plan to  see him back in the office in four months.     Doylene Canning. Ladona Ridgel, MD  Electronically Signed    GWT/MedQ  DD: 08/24/2006  DT: 08/25/2006  Job #: [id number]   cc:   Eduardo Osier. Sharyn Lull, M.D.

## 2010-09-26 NOTE — H&P (Signed)
NAME:  [name], [name]                          ACCOUNT NO.:  [id number]   MEDICAL RECORD NO.:  [id number]                   PATIENT TYPE:  INP   LOCATION:  0001                                 FACILITY:  [facility]   PHYSICIAN:  Lindaann Slough, M.D.               DATE OF BIRTH:  10-31-1950   DATE OF ADMISSION:  06/29/2003  DATE OF DISCHARGE:                                HISTORY & PHYSICAL   CHIEF COMPLAINT:  Adenocarcinoma of prostate.   HISTORY OF PRESENT ILLNESS:  Patient is a 60 year old male who has a history  of elevated PSA.  Prostate biopsy done February 23, 2003 is positive for  adenocarcinoma Gleason Score 6.  His PSA was 4.06 in August 2004.  Treatment  options were discussed with the patient and his wife and we chose him have a  radical prostatectomy and he is admitted today for the procedure.  Patient  was initially scheduled for surgery in December, however, he had an abnormal  EKG and he was evaluated by his cardiologist Dr. Sharyn Lull and he had a  negative Cardiolite and he is cleared for surgery.   PAST MEDICAL HISTORY:  He has a history of hypertension,  hypercholesterolemia, borderline diabetes controlled by diet.   FAMILY HISTORY:  There is a family history of diabetes and his father has  prostate cancer and there is a family history of hypertension and heart  disease.   SOCIAL HISTORY:  He is married, has two children, does not smoke nor drink.   MEDICATIONS:  He is on Coreg 6.25 mg twice a day, Altace 5 mg daily, and  Lipitor 20 mg daily.   ALLERGIES:  He has no known drug allergies.   REVIEW OF SYSTEMS:  He has no cough, no shortness of breath, no hemoptysis.  He has no palpitation, no chest pain.  He has no nausea, no vomiting, no  diarrhea or constipation.  __________ he has difficulty with erections at  times and he has frequency and urgency.   PHYSICAL EXAMINATION:  His blood pressure is 120/80, pulse 58, respirations  18, temperature 98 and weight 244  pounds.  His head is normal; pupils are  equal and reactive to light and accommodation; ears/nose and throat within  normal limits.  Neck supple; no cervical lymph nodes, no thyromegaly.  Chest  symmetrical.  Lungs are fully expanded and clear to percussion and  auscultation.  Heart regular rate; no murmur, no gallops.  Abdomen is soft,  protuberant, soft, nontender; he has no hepatomegaly, no splenomegaly;  bladder is not distended; he has no inguinal hernia.  Penis and meatus are  normal; scrotum is unremarkable; both testicles, cords, and epididymides are  within normal limits.  On rectal examination sphincter tone is normal,  prostate is enlarged 40 g, no nodules, seminal vesicles not palpable.   IMPRESSION:  Adenocarcinoma of prostate, hypertension, hypercholesterolemia,  and diabetes.  Lindaann Slough, M.D.   MN/MEDQ  D:  06/29/2003  T:  06/29/2003  Job:  [id number]

## 2010-09-26 NOTE — Op Note (Signed)
NAME:  [name], [name]                          ACCOUNT NO.:  [id number]   MEDICAL RECORD NO.:  [id number]                   PATIENT TYPE:  INP   LOCATION:  0001                                 FACILITY:  [facility]   PHYSICIAN:  Lindaann Slough, M.D.               DATE OF BIRTH:  [birth year]   DATE OF PROCEDURE:  06/29/2003  DATE OF DISCHARGE:                                 OPERATIVE REPORT   PREOPERATIVE DIAGNOSIS:  Adenocarcinoma of the prostate.   POSTOPERATIVE DIAGNOSIS:  Adenocarcinoma of the prostate.   OPERATION/PROCEDURE:  Radical retropubic prostatectomy with bilateral pelvic  lymph node dissection.   SURGEON:  Lindaann Slough, M.D.   ASSISTANT:  Susanne Borders, M.D.   ANESTHESIA:  General endotracheal plus local skin injection with 20 mL of  0.5% Marcaine.   SPECIMENS:  1. Bilateral pelvic lymph nodes.  2. Prostate.  3. Prostatic apex tissue.  4. Prostatic base tissue.   ESTIMATED BLOOD LOSS:  One liter.   DRAINS:  1. 20-French Foley catheter to straight drain.  2. #10 Blake drain to bulb suction.   COMPLICATIONS:  None.   DISPOSITION:  To post anesthesia care unit in stable condition.   INDICATIONS FOR PROCEDURE:  [name] is a 60 year old African American male  who was initially found to have an elevated PSA.  He subsequently underwent  a prostate biopsy in October 2004 which demonstrated Gleason's 3 + 3 = 6  adenocarcinoma on the right side.  The patient's various treatment options  were discussed with him including watchful waiting with expected management,  hormone therapy, radiation therapy, and radical prostatectomy.  The patient  consented to radical retropubic prostatectomy after understanding the risks,  benefits and alternatives.   DESCRIPTION OF PROCEDURE:  The patient was brought to the operating room and  correctly identified by his identification bracelet.  He is given  preoperative antibiotics and general endotracheal anesthesia.  He was  shaved, prepped and draped in typical sterile fashion.  He was placed in the  supine position with bump under his pelvis and the table slightly flexed.  An infraumbilical midline incision was made in the skin with a scalpel.  This incision extended from the pubic symphysis to approximately one-half  the distance between the pubis and the umbilicus.  Camper's and Scarpa's  fascia were incised with the Bovie electrocautery down to the rectus sheath.  The rectus sheath was incised with Bovie electrocautery and the midline was  found.  The transversalis fascia between the two bellies of the rectus  abdominis were incised with the Bovie electrocautery.  Space of Retzius was  then further defined with blunt dissection using surgeon's hand.  The  Buchwalter retractor was placed.  Bilateral pelvic lymph node dissection was  performed with sharp and blunt dissection.  The extent of the lymph node  packet was from the external iliac vein anteriorly to the obturator nerve  posteriorly.  The obturator nerves were identified on both sides and were  spared from injury.  Inferior aspect of the lymph node packet was taken to  the circumflex iliac vessels.  At this point a large Hemovac clip was placed  on the packet and the packet was incised.  Blunt dissection was used to  separate the packet posteriorly away from the body wall.  The packet was  taken anteriorly up to approximately the level of the ureter.  A large Hem-o-  lok clip was placed on the proximal aspect of the packet and the packet was  incised.  This was done on both sides.  Specimens were passed off the table  and sent for pathologic analysis.   The bladder was then retracted superiorly.  The fatty tissue overlying the  endopelvic fascia was bluntly separated bilaterally.  The endopelvic fascia  was scored with a Bovie electrocautery and sharp dissection was used to  extend the incision superiorly.  The fatty tissue overlying the   puboprostatic ligaments was likewise bluntly cleared away.  The  puboprostatic ligaments were incised bilaterally with Metzenbaum scissors.  This allowed the apex of the prostate to drop inferiorly away from the  pubis.  As noted, at this point the patient had an extremely small prostate  gland.  The gland was so small that the apex was difficult to palpate with  surgeon's fingers.  Attempt was made to pass the Hohenfellner right angle  between the dorsal vein complex and the urethra.  Two 0 Vicryl ties were  placed in the space that was created by the Hohenfellner.  What we thought  was the dorsal vein complex was then incised at this point.  We realized  that we had inadvertently simply passed the Hohenfellner underneath the base  of the prostate, creating a plane between the base of the prostate and the  bladder neck.  The Hohenfellner was once again passed further caudally  distal to the apex.  The Hohenfellner was passed between the dorsal vein  complex and the anterior urethra.  The urethra could be palpated because a  24-French Foley catheter had been placed at the beginning of the case.  A #1  Vicryl tie was used to tie the dorsal vein complex and it was incised with a  long 15-blade scalpel.  The urethra was then identified and Hohenfellner was  passed around the urethra.  The anterior wall of the urethra was incised  with a knife.  The Foley catheter was then cut and the valve stem lubricated  and pulled  into the wound.  The posterior aspect of the urethra was incised  with Metzenbaum scissors.  At this point we were able to get a posterior  plane.  There was very little lateral pedicle tissue again because of the  extremely small size of the patient's gland.  The lateral tissue was  systematically taken down on both sides until the outline of the seminal  vesicles was identified.  The Denonvilliers fascia overlying the seminal vesicles was incised with a scalpel and this plane  was further developed  with Metzenbaum scissors.  Tissue dissection was performed on the seminal  vessels bilaterally and dissected as far  distally as possible.  They were  clipped with large Hem-o-lok clips and incised.  The vas deferens were also  dissected posteriorly, were clipped with __________ and incised.  After this  posterior dissection was performed, there was very little attachment left of  the prostate as  the bladder neck dissection had already been done.  There  was some small remaining posterior attachments to the bladder neck which  were taken down with Metzenbaum scissors.  This freed the prostate gland in  its entirety.  The apex of the prostate appeared to be intact despite the  entry into the anterior aspect of the prostate at the bladder neck from the  initial pass of the Hohenfellner.   The bladder neck was then reconstructed.  There was very little  reconstruction needed to be done.  A single 2-0 Vicryl suture was placed at  6 o'clock to make the bladder neck slightly smaller.  The bladder neck  mucosa was then everted with several interrupted 4-0 chromic sutures.  The  24-French Greenwald urethral sound was then placed within the urethra.  There was noted to be some oozing from the dorsal vein complex and this was  oversewn with a single 2-0 Vicryl in a figure-of-eight fashion.  The  anastomotic sutures were then placed using 2-0 Monocryl.  Sutures were  placed at 2, 5, 7, 10, and 12 o'clock.  The two posterior sutures were then  placed in their corresponding positions in the bladder neck.  A 20-French  Silastic Foley catheter was then placed within the bladder.  The remaining  anastomotic sutures were placed in their corresponding positions.  Bladder  was released from cephalad retraction and allowed to drop down into the  pelvis.  The __________ sutures were then tied down.   The Foley catheter was irrigated with an Asepto syringe and there was no  anastomotic  leak noted.  There was also very little blood or blood clots  within the bladder.  The pelvis was then copiously irrigated with antibiotic  solution.  A #10 Blake drain was placed in the left lower quadrant making a  stab incision attaching a tonsil to the anterior abdominal wall, grasping  the drain and pulling it through.  The drain was sewn in place with a 2-0  silk.  The rectus abdominis was reapproximated with three interrupted 2-0  chromic sutures.  The rectus fascia was then with a running 0 PDS.  Subcutaneous tissues were closed to close the dead space with several  interrupted 3-0 Vicryls.  The skin was then closed with a running 4-0  Monocryl.  Benzoin and Steri-Strips were applied.  The Foley catheter was  connected to straight drain.  The patient was then awakened from anesthesia  without complications having tolerated the procedure well.  He was taken to  post anesthesia care unit in stable condition.  All sponge and needle counts were correct at the end of the case.   Please note that Dr. Brunilda Payor was present and participated in all aspects of the  case as he is the primary surgeon.     Susanne Borders, MD                           Lindaann Slough, M.D.    DR/MEDQ  D:  06/29/2003  T:  06/29/2003  Job:  [id number]

## 2010-09-26 NOTE — Discharge Summary (Signed)
[name], [name]                ACCOUNT NO.:  [id number]   MEDICAL RECORD NO.:  [id number]          PATIENT TYPE:  INP   LOCATION:  4735                         FACILITY:  [facility]   PHYSICIAN:  Doylene Canning. Ladona Ridgel, M.D.  DATE OF BIRTH:  1951/01/06   DATE OF ADMISSION:  03/11/2004  DATE OF DISCHARGE:  03/12/2004                                 DISCHARGE SUMMARY   DISCHARGE DIAGNOSES:  1.  Postprocedure day #1, status post implantation of Medtronic Maximow VR      [id number] cardioverter defibrillator.  2.  History of prior inferior myocardial infarction with ischemic      cardiomyopathy, ejection fraction 25 to 30%.  3.  Congestive heart failure class II.  4.  Hypertension.  5.  Dilated cardiomyopathy.  6.  Dyslipidemia.  7.  History of prostate cancer, status post radical prostatectomy.  8.  Diabetes.   DISPOSITION:  The patient is ready for discharge on post procedure day #1  after implantation of ICD.  He is achieving 96% oxygen saturation on room  air and he has had no complaints.  Incision looks good, healing nicely.  NO  ecchymosis and no swelling and no tenderness.  Although, he has been given a  prescription for Darvocet to help with the discomfort.  Chest x-ray has been  obtained, the leads are intact, no pneumothorax, the leads are in  appropriate position, and the patient is in a sinus rhythm.   DISCHARGE MEDICATIONS:  1.  Coreg 12.5 mg twice daily.  2.  Lipitor 10 mg daily at bedtime.  3.  Metformin, but he is to hold that until Saturday, November 5, and then      restart.  4.  Enteric-coated aspirin 81 mg daily.  5.  Quinine sulfate 324 mg daily at bedtime.  6.  Altace 20 mg daily.  7.  For pain, Darvocet-N 100 one to two tablets every three to four hours as      needed for pain.   ACTIVITY:  Has been discussed with the patient.   DIET:  Low sodium, low cholesterol, ADA diet.   WOUND CARE:  The incision is to stay dry for the next week.  He is to sponge  bathe  until showering can begin on Tuesday, November 8.   He was asked not to drive for the next 10 days.   FOLLOW UP:  He has an appointment at the [facility] on Monday, April 07, 2004, at 9:30 a.m.  He will see  Dr. Ladona Ridgel in three months, an appointment will be made for February of  2006.   HISTORY OF PRESENT ILLNESS:  The patient is a 60 year old male who has a  history of hypertensive heart disease, dilated cardiomyopathy,  hyperlipidemia, and prostate cancer.  He has diet-controlled borderline  diabetes and obesity.  The patient initially had a cardiology evaluation  after EKG demonstrated probable inferior myocardial infarction.  Cardiolite  stress test in 2000 suggested inferior scar with ejection fraction 25 to  30%.  The patient underwent catheterization secondary to chest pain  and at  that time had known obstructive coronary disease that was treated with  medical therapy.  The patient has congestive heart failure class II  symptoms, but denies syncope.  He is referred to Dr. Ladona Ridgel,  electrophysiologist.  He has cardiomyopathy of essentially unknown etiology.  Nuclear stress study suggests he may have had a prior diaphragmatic  infarction, but he clearly has a scar by Cardiolite study.  With congestive  heart failure symptoms under maximal treatment, the patient is a candidate  for prophylactic implantable cardioverter defibrillator.  The patient will  be admitted to [facility] on November 1 for elective implantation.   HOSPITAL COURSE:  After elective presentation on November 1, he had  cardioverter defibrillator placed by Dr. Ladona Ridgel without complications and  his incision is healing nicely and as described in discharge disposition,  the patient is ready for discharge 16 hours after implantation.  The patient  goes home on the medications and follow-up as dictated.       GM/MEDQ  D:  03/12/2004  T:  03/12/2004  Job:   [id number]   cc:   Eduardo Osier. Sharyn Lull, M.D.  [address]  [place]  [place] [address]  Fax: [phone]   Doylene Canning. Ladona Ridgel, M.D.   Jarome Matin, M.D.  [address] [place]  [place] [address]  Fax: [phone]

## 2010-09-26 NOTE — Cardiovascular Report (Signed)
NAME:  [name], [name]                          ACCOUNT NO.:  [id number]   MEDICAL RECORD NO.:  [id number]                   PATIENT TYPE:  OIB   LOCATION:  6598                                 FACILITY:  [facility]   PHYSICIAN:  Mohan N. Sharyn Lull, M.D.              DATE OF BIRTH:  1950/08/30   DATE OF PROCEDURE:  09/27/2003  DATE OF DISCHARGE:  09/27/2003                              CARDIAC CATHETERIZATION   PROCEDURE:  Left cardiac catheterization with selective left and right  coronary angiography and left ventriculography via the right groin using  Judkins technique.   INDICATIONS:  Mr. Kassim is a 60 year old black male with past medical  history significant for hypertensive heart disease, dilated cardiomyopathy,  hypercholesterolemia, CA of prostate, borderline diabetes mellitus  controlled by diet, morbid obesity who complains of retrosternal chest  tightness grade 3/10 associated with exertion and relieved with rest.  The  patient denies any nausea, vomiting, diaphoresis.  Denies PND, orthopnea,  leg swelling.  EKG done in the office showed normal sinus rhythm with LVH  and T-wave inversion in inferior leads and probable old inferior wall  myocardial infarction.  The patient had stress Cardiolite in December 2004  which was positive by EKG criteria, but Cardiolite scan showed no evidence  of ischemia with inferior wall scar and EF of 28%.  Due to recurrent chest  pain, multiple risk factors and depressed LV function, discussed with  patient regarding left cath, its risks and benefits and consented for the  procedure.   PAST MEDICAL HISTORY:  As above.   PAST SURGICAL HISTORY:  He had TURP last year.  Had also ileorectal abscess  drained approximately 10+ years ago.   MEDICATIONS:  1. Altace 10 mg p.o. daily.  2. Coreg 6.26 mg p.o. b.i.d.  3. Lipitor 20 mg p.o. daily.  4. Enteric coated aspirin 81 mg p.o. daily.   SOCIAL HISTORY:  He is married and has two children.  No  history of smoking  or alcohol abuse.  He works for [employer].  Born in [place] and lives in  [place].   FAMILY HISTORY:  Father died of complications of myocardial infarction.  He  had coronary artery bypass grafting in 1970s.  Mother is alive.  She is  hypertensive.  One sister has heart problems.  She is hypertensive.  One  brother in good health.   PHYSICAL EXAMINATION:  GENERAL:  He was alert, awake, oriented x3 in no  acute distress.  VITAL SIGNS:  Blood pressure 124/80, pulse 70 and regular.  HEENT:  Conjunctivae pink.  NECK:  Supple.  No jugular venous distention.  No bruit.  LUNGS:  Clear to auscultation without rhonchi or rales.  CARDIOVASCULAR:  S1, S2 was normal.  There was soft systolic murmur.  There  was no S3 gallop.  ABDOMEN:  Soft.  Bowel sounds are present.  Nontender.  EXTREMITIES:  No clubbing, cyanosis  or edema.   IMPRESSION:  1. Recurrent chest pain, positive stress test by EKG criteria.  Rule out     coronary insufficiency, hypertensive heart disease, dilated     cardiomyopathy, compensated congestive heart failure.  2. Noninsulin-dependent diabetes mellitus controlled by diet.  3. Morbid obesity.  4. Hypercholesterolemia.   Discussed with the patient at length regarding left cath, its risks; i.e.,  death, myocardial infarction, stroke, local vascular complications, etc.,  and consented for the procedure.   PROCEDURE:  After obtaining informed consent, the patient was brought to the  cath lab and was placed on fluoroscopy table. Right groin was prepped and  draped in usual fashion.  2% Xylocaine was used for local anesthesia in the  right groin.  With the help of thin-wall needle, a 4-French arterial sheath  was placed.  The sheath was aspirated and flushed.  Next, 4-French left  Judkins catheter was advanced over the wire under fluoroscopic guidance up  to the ascending aorta.  Wire was pulled out and the catheter was aspirated  and connected to  the manifold. Catheter was further advanced and engaged  into left coronary ostium.  Multiple views of the left system were taken.  Next, the catheter was disengaged and was pulled out over the wire and was  replaced with 4-French right Judkins catheter which was advanced over the  wire under fluoroscopic guidance to the ascending aorta.  Wire was pulled  out, the catheter was aspirated and connected to the manifold.  Catheter was  further advanced and engaged into right coronary ostium.  Multiple views of  the right system were taken.  Next, the catheter was disengaged and was  pulled out over the wire and was replaced with 4-French pigtail catheter  which was advanced over wire under fluoroscopic guidance up to the ascending  aorta.  Wire was pulled out, the catheter was aspirated and connected to the  manifold.  Catheter was further advanced across the aortic valve into the  left ventricle.  Left ventricular pressures were recorded.  Next, left  ventriculography was done in 30-degree RAO position.  Post angiographic  pressures were recorded from LV and then pullback pressures were recorded  from the aorta.  There was no gradient across the aortic valve.  Next, the  pigtail catheter was pulled out over the wire, sheaths aspirated and  flushed.   FINDINGS:  The LV showed global hypokinesia, LVH, EF of approximately 30%.  Left main was patent.  LAD was patent.  Diagonal one was small which was  patent.  Diagonal two was moderate size which was patent.  Left circumflex  was large which has 10-15% proximal stenosis.  OM-1 was very, very small.  OM-2 was small which was patent.  OM-3 was moderate size which was patent.  RCA was patent.  The patient has co-dominant coronary system.  The patient  tolerated the procedure well.  There were no complications.   Plan is to maximize medications; i.e., beta blockers, ACE inhibitors.  We will check his fasting blood sugar and hemoglobin A1c as  outpatient.  The  patient has been advised to stay away from sweets and carbohydrates and stay  on low salt, low cholesterol diet.  The patient will be discharged home  later this morning if hemodynamically stable.  Eduardo Osier. Sharyn Lull, M.D.    MNH/MEDQ  D:  09/27/2003  T:  09/28/2003  Job:  [id number]   cc:   Lindaann Slough, M.D.  [address]  [place]  [place] [address]  Fax: [phone]   Jarome Matin, M.D.  [address] [place]  [place] [address]  Fax: [phone]

## 2010-10-02 ENCOUNTER — Other Ambulatory Visit: Payer: Self-pay

## 2010-10-02 MED ORDER — SITAGLIPTIN PHOS-METFORMIN HCL 50-1000 MG PO TABS: 1.0000 | ORAL_TABLET | Freq: Two times a day (BID) | ORAL | Status: DC

## 2010-10-02 NOTE — Telephone Encounter (Signed)
please complete stool cards & recheck CBC with B12, folate & iron panel to assess stability of anemia  recheck A1c in 6 months .(Codes: 285.9,250.00). Hopp  **copied from 09/02/2009 lab append**

## 2010-10-13 ENCOUNTER — Other Ambulatory Visit: Payer: Self-pay | Admitting: Internal Medicine

## 2010-10-13 MED ORDER — CARVEDILOL PHOSPHATE ER 80 MG PO CP24: 80.0000 mg | ORAL_CAPSULE | Freq: Every day | ORAL | Status: DC

## 2010-11-27 ENCOUNTER — Other Ambulatory Visit: Payer: Self-pay | Admitting: Internal Medicine

## 2010-11-27 MED ORDER — SITAGLIPTIN PHOS-METFORMIN HCL 50-1000 MG PO TABS: 1.0000 | ORAL_TABLET | Freq: Two times a day (BID) | ORAL | Status: DC

## 2010-11-27 NOTE — Telephone Encounter (Signed)
Patient needs to schedule a lab appointment A1c 250.00

## 2010-12-05 ENCOUNTER — Telehealth: Payer: Self-pay | Admitting: Gastroenterology

## 2010-12-05 NOTE — Telephone Encounter (Signed)
Patient was contacted about recall follow up office visit, per Dr. Leone Payor. appt. scheduled for 01/20/11 @ 8:45 am. New patient info mailed to the patient.

## 2010-12-29 ENCOUNTER — Other Ambulatory Visit: Payer: Self-pay | Admitting: Internal Medicine

## 2010-12-30 MED ORDER — CARVEDILOL PHOSPHATE ER 80 MG PO CP24: 80.0000 mg | ORAL_CAPSULE | Freq: Every day | ORAL | Status: DC

## 2010-12-30 NOTE — Telephone Encounter (Signed)
Patient due to schedule yearly follow-up (Letter Mailed)

## 2010-12-31 ENCOUNTER — Emergency Department (HOSPITAL_COMMUNITY): Payer: Managed Care, Other (non HMO)

## 2010-12-31 ENCOUNTER — Inpatient Hospital Stay (HOSPITAL_COMMUNITY)
Admission: EM | Admit: 2010-12-31 | Discharge: 2011-01-03 | DRG: 309 | Disposition: A | Discharge status: Home / Self Care | Payer: Managed Care, Other (non HMO) | Source: Ambulatory Visit | Attending: Internal Medicine | Admitting: Internal Medicine

## 2010-12-31 DIAGNOSIS — I4729 Other ventricular tachycardia: Principal | ICD-10-CM | POA: Diagnosis present

## 2010-12-31 DIAGNOSIS — Z7982 Long term (current) use of aspirin: Secondary | ICD-10-CM

## 2010-12-31 DIAGNOSIS — E785 Hyperlipidemia, unspecified: Secondary | ICD-10-CM | POA: Diagnosis present

## 2010-12-31 DIAGNOSIS — Z8546 Personal history of malignant neoplasm of prostate: Secondary | ICD-10-CM

## 2010-12-31 DIAGNOSIS — I472 Ventricular tachycardia, unspecified: Secondary | ICD-10-CM

## 2010-12-31 DIAGNOSIS — I4891 Unspecified atrial fibrillation: Secondary | ICD-10-CM | POA: Diagnosis present

## 2010-12-31 DIAGNOSIS — G4733 Obstructive sleep apnea (adult) (pediatric): Secondary | ICD-10-CM | POA: Diagnosis present

## 2010-12-31 DIAGNOSIS — Z95810 Presence of automatic (implantable) cardiac defibrillator: Secondary | ICD-10-CM

## 2010-12-31 DIAGNOSIS — E119 Type 2 diabetes mellitus without complications: Secondary | ICD-10-CM | POA: Diagnosis present

## 2010-12-31 DIAGNOSIS — I428 Other cardiomyopathies: Secondary | ICD-10-CM | POA: Diagnosis present

## 2010-12-31 DIAGNOSIS — I5022 Chronic systolic (congestive) heart failure: Secondary | ICD-10-CM | POA: Diagnosis present

## 2010-12-31 DIAGNOSIS — I10 Essential (primary) hypertension: Secondary | ICD-10-CM | POA: Diagnosis present

## 2010-12-31 LAB — PROTIME-INR
INR: 1.03 (ref 0.00–1.49)
Prothrombin Time: 13.7 seconds (ref 11.6–15.2)

## 2010-12-31 LAB — POCT I-STAT TROPONIN I: Troponin i, poc: 0.31 ng/mL (ref 0.00–0.08)

## 2010-12-31 LAB — CBC
HCT: 38 % — ABNORMAL LOW (ref 39.0–52.0)
Hemoglobin: 12.3 g/dL — ABNORMAL LOW (ref 13.0–17.0)
MCH: 22.9 pg — ABNORMAL LOW (ref 26.0–34.0)
MCHC: 32.4 g/dL (ref 30.0–36.0)
MCV: 70.8 fL — ABNORMAL LOW (ref 78.0–100.0)
Platelets: 257 10*3/uL (ref 150–400)
RBC: 5.37 MIL/uL (ref 4.22–5.81)
RDW: 15.6 % — ABNORMAL HIGH (ref 11.5–15.5)
WBC: 8.8 10*3/uL (ref 4.0–10.5)

## 2010-12-31 LAB — BASIC METABOLIC PANEL
BUN: 9 mg/dL (ref 6–23)
CO2: 28 mEq/L (ref 19–32)
Calcium: 9.5 mg/dL (ref 8.4–10.5)
Chloride: 105 mEq/L (ref 96–112)
Creatinine, Ser: 0.93 mg/dL (ref 0.50–1.35)
GFR calc Af Amer: >60 mL/min (ref >60)
GFR calc non Af Amer: >60 mL/min (ref >60)
Glucose, Bld: 121 mg/dL — ABNORMAL HIGH (ref 70–99)
Potassium: 3.9 mEq/L (ref 3.5–5.1)
Sodium: 142 mEq/L (ref 135–145)

## 2010-12-31 LAB — DIFFERENTIAL
Basophils Absolute: 0 10*3/uL (ref 0.0–0.1)
Basophils Relative: 0 % (ref 0–1)
Eosinophils Absolute: 0.1 10*3/uL (ref 0.0–0.7)
Eosinophils Relative: 1 % (ref 0–5)
Lymphocytes Relative: 24 % (ref 12–46)
Lymphs Abs: 2.1 10*3/uL (ref 0.7–4.0)
Monocytes Absolute: 0.8 10*3/uL (ref 0.1–1.0)
Monocytes Relative: 9 % (ref 3–12)
Neutro Abs: 5.8 10*3/uL (ref 1.7–7.7)
Neutrophils Relative %: 66 % (ref 43–77)

## 2010-12-31 LAB — MRSA PCR SCREENING: MRSA by PCR: NEGATIVE

## 2010-12-31 LAB — CK TOTAL AND CKMB (NOT AT ARMC)
CK, MB: 9 ng/mL (ref 0.3–4.0)
Relative Index: 1.5 (ref 0.0–2.5)
Total CK: 600 U/L — ABNORMAL HIGH (ref 7–232)

## 2010-12-31 LAB — GLUCOSE, CAPILLARY
Glucose-Capillary: 107 mg/dL — ABNORMAL HIGH (ref 70–99)
Glucose-Capillary: 183 mg/dL — ABNORMAL HIGH (ref 70–99)

## 2010-12-31 LAB — APTT: aPTT: 29 seconds (ref 24–37)

## 2010-12-31 LAB — CARDIAC PANEL(CRET KIN+CKTOT+MB+TROPI): Total CK: 585 U/L — ABNORMAL HIGH (ref 7–232)

## 2011-01-01 DIAGNOSIS — I519 Heart disease, unspecified: Secondary | ICD-10-CM

## 2011-01-01 LAB — PRO B NATRIURETIC PEPTIDE: Pro B Natriuretic peptide (BNP): 137.3 pg/mL — ABNORMAL HIGH (ref 0–125)

## 2011-01-01 LAB — GLUCOSE, CAPILLARY
Glucose-Capillary: 134 mg/dL — ABNORMAL HIGH (ref 70–99)
Glucose-Capillary: 135 mg/dL — ABNORMAL HIGH (ref 70–99)

## 2011-01-01 LAB — CARDIAC PANEL(CRET KIN+CKTOT+MB+TROPI)
CK, MB: 6.4 ng/mL (ref 0.3–4.0)
Relative Index: 1.8 (ref 0.0–2.5)
Total CK: 509 U/L — ABNORMAL HIGH (ref 7–232)
Troponin I: 0.77 ng/mL (ref <0.30)

## 2011-01-01 LAB — BASIC METABOLIC PANEL
CO2: 28 mEq/L (ref 19–32)
Calcium: 9.2 mg/dL (ref 8.4–10.5)
GFR calc non Af Amer: >60 mL/min (ref >60)
Potassium: 3.7 mEq/L (ref 3.5–5.1)
Sodium: 141 mEq/L (ref 135–145)

## 2011-01-01 LAB — MAGNESIUM: Magnesium: 2 mg/dL (ref 1.5–2.5)

## 2011-01-02 LAB — BASIC METABOLIC PANEL
BUN: 9 mg/dL (ref 6–23)
Chloride: 107 mEq/L (ref 96–112)
Glucose, Bld: 128 mg/dL — ABNORMAL HIGH (ref 70–99)
Potassium: 3.8 mEq/L (ref 3.5–5.1)

## 2011-01-02 LAB — GLUCOSE, CAPILLARY
Glucose-Capillary: 105 mg/dL — ABNORMAL HIGH (ref 70–99)
Glucose-Capillary: 160 mg/dL — ABNORMAL HIGH (ref 70–99)

## 2011-01-05 ENCOUNTER — Encounter: Payer: Self-pay | Admitting: Internal Medicine

## 2011-01-05 ENCOUNTER — Ambulatory Visit (INDEPENDENT_AMBULATORY_CARE_PROVIDER_SITE_OTHER): Payer: Managed Care, Other (non HMO) | Admitting: Internal Medicine

## 2011-01-05 DIAGNOSIS — E119 Type 2 diabetes mellitus without complications: Secondary | ICD-10-CM

## 2011-01-05 DIAGNOSIS — M546 Pain in thoracic spine: Secondary | ICD-10-CM

## 2011-01-05 DIAGNOSIS — Z95810 Presence of automatic (implantable) cardiac defibrillator: Secondary | ICD-10-CM

## 2011-01-05 DIAGNOSIS — D649 Anemia, unspecified: Secondary | ICD-10-CM

## 2011-01-05 LAB — CBC WITH DIFFERENTIAL/PLATELET
Basophils Relative: 0.3 % (ref 0.0–3.0)
Eosinophils Relative: 1.2 % (ref 0.0–5.0)
HCT: 39.8 % (ref 39.0–52.0)
Lymphs Abs: 2.4 10*3/uL (ref 0.7–4.0)
MCV: 73.5 fl — ABNORMAL LOW (ref 78.0–100.0)
Monocytes Absolute: 0.7 10*3/uL (ref 0.1–1.0)
Neutro Abs: 5.9 10*3/uL (ref 1.4–7.7)
RBC: 5.42 Mil/uL (ref 4.22–5.81)
WBC: 9.1 10*3/uL (ref 4.5–10.5)

## 2011-01-05 LAB — HEMOGLOBIN A1C: Hgb A1c MFr Bld: 7.2 % — ABNORMAL HIGH (ref 4.6–6.5)

## 2011-01-05 MED ORDER — TRAMADOL HCL 50 MG PO TABS: 50.0000 mg | ORAL_TABLET | Freq: Four times a day (QID) | ORAL | Status: DC | PRN

## 2011-01-05 NOTE — Patient Instructions (Signed)
Eat a low-fat diet with lots of fruits and vegetables, up to 7-9 servings per day. Avoid obesity; your goal is waist measurement < 40 inches.Consume less than 40 grams of sugar per day from foods & drinks with High Fructose Corn Sugar as #1,2,3 or # 4 on label. Follow the low carb nutrition program in [employer] as closely as possible to prevent Diabetes progression & complications. White carbohydrates (potatoes, rice, bread, and pasta) have a high spike of sugar and a high load of sugar. For example a  baked potato has a cup of sugar and a  french fry  2 teaspoons of sugar. Yams, wild  rice, whole grained bread &  wheat pasta have been much lower spike and load of  sugar. Portions should be the size of a deck of cards or your palm.  

## 2011-01-05 NOTE — H&P (Signed)
[name], [name] NO.:  [id number]  MEDICAL RECORD NO.:  [id number]  LOCATION:  2111                         FACILITY:  [facility]  PHYSICIAN:  Duke Salvia, MD, FACCDATE OF BIRTH:  [birth year]  DATE OF ADMISSION:  12/31/2010 DATE OF DISCHARGE:                             HISTORY & PHYSICAL   PRIMARY CARDIOLOGIST:  Doylene Canning. Ladona Ridgel, MD.  PRIMARY CARE PROVIDER:  Dr. Alwyn Ren.  PATIENT PROFILE:  A 60 year old male with history of nonischemic cardiomyopathy status post ICD in 2005, who presents with multiple ICD shocks.  PROBLEMS: 1. Symptomatic ventricular tachycardia status post implantable     cardioverter defibrillator shocks. 2. Nonischemic cardiomyopathy.     a.     Sep 27, 2003, cardiac catheterization, ejection fraction 30%      with normal coronary artery.     b.     March 11, 2004, status post Medtronic Maximo 7232 single-      chamber implantable cardioverter defibrillator [phone] lead in the      right ventricle). 3. Chronic systolic congestive heart failure. 4. Prostate cancer status post prostatectomy on June 29, 2003. 5. Hypertension. 6. Hyperlipidemia. 7. Diabetes mellitus. 8. Obstructive sleep apnea on continuous positive airway pressure.  ALLERGIES:  No known drug allergies.  HISTORY OF PRESENT ILLNESS:  A 60 year old male with the above problem list.  He is status post Medtronic single-chamber ICD in November 2005, and had never been shocked.  The patient is active at work and has had no issues recently.  He reports an occasional dizzy spell that can last up to 1 hour, but no chest pain, dyspnea, edema, presyncope, syncope, early satiety, orthopnea, or PND.  The patient ran out of his Coreg CR approximately 1 week ago and has not refilled it yet.  Today while at work, he was standing, he had sudden lightheadedness and presyncope followed by an ICD shock.  This caused him to step back and then fall without losing consciousness.  A  friend called 911.  Within 2 minutes, the patient had recurrent syncope followed by multiple ICD shocks.  EMS arrived and took the patient to the [facility] ED where he has continued to have asymptomatic runs in the same VT.  His electrolytes are within normal limits.  ICD has been interrogated and shows 5 therapies, though none successfully converted his VT to sinus rhythm.  Fortunately for him, his VT spontaneously terminated.  He is currently symptom free.  HOME MEDICATIONS: 1. Aspirin 325 mg p.r.n. 2. Janumet 50/1000 mg b.i.d. 3. Lipitor 20 mg daily. 4. Digoxin 0.25 mg daily. 5. Coreg CR 80 mg daily, though he has been off this for 1 week. 6. Ramipril 20 mg b.i.d.  FAMILY HISTORY:  Mother has a history of hypertension.  Father died of a history of coronary artery disease and CABG as well as prostate cancer. He has a sister with hypertension and brother who is alive and well.  SOCIAL HISTORY:  The patient lives in [place] with his wife.  He works as a Public librarian at [employer].  Denies tobacco, alcohol, or drug use.  Does not routinely exercise.  REVIEW OF SYSTEMS:  Positive for presyncope as outlined in the HPI. Otherwise, all systems reviewed and negative.  He is a full code.  PHYSICAL EXAMINATION:  VITAL SIGNS:  Temperature 98.2, heart rate 81, respirations 18, blood pressure 111/63, pulse ox 99% on room air. GENERAL:  Pleasant African American male in no acute distress.  Awake, alert, and oriented x3.  He has a normal affect. HEENT:  Normal.  Nares grossly intact, nonfocal. SKIN:  Warm and dry without lesions or masses. NECK:  Supple without bruits or JVD. LUNGS:  Respirations are regular and unlabored, clear to auscultation. CARDIAC:  Regular S1, S2.  No S3, S4, murmurs. ABDOMEN:  Round, soft, nontender, nondistended.  Bowel sounds present x4. EXTREMITIES:  Warm, dry, pink.  No clubbing, cyanosis, or edema. Dorsalis pedis and posterior tibial pulses 2+ and equal  bilaterally.  Chest x-ray shows no acute process.  EKG shows sinus rhythm with PACs, rate 86, left axis, inferolateral ST-T changes.  Lateral changes slightly more pronounced compared to old EKG.  Hemoglobin 12.3, hematocrit 38.2, WBC 8.8, platelets 257.  Sodium 142, potassium 3.9, chloride 105, CO2 of 28, BUN 9, creatinine 0.93, glucose 121.  CK 600, MB 9.2, troponin I 0.31, INR 1.03.  ASSESSMENT AND PLAN: 1. Ventricular tachycardia/presyncope.  The patient presents with     symptomatic ventricular tachycardia, requiring implantable     cardioverter defibrillator shock, though these were unsuccessful in     terminating his ventricular tachycardia.  The patient reports     having been off his Coreg CR for 1 week.  The patient is seen by     Dr. Graciela Husbands and plan is to switch him to short-acting Coreg at a     lower dose and add sotalol 120 mg q.12 h., first dose now in an     effort to reduce his fibrillation threshold.  The patient has not     had any signs or symptoms of ischemia or CHF recently.  Elevated     cardiac marker is likely secondary to ICD shock.  We will continue     to follow trend.     May need to consider ICD revision. 2. Nonischemic cardiomyopathy/chronic systolic congestive heart     failure, euvolemic.  Resume beta-blocker as above.  Continue ACE     inhibitor and digoxin and we will check a level.     Nicolasa Ducking, ANP   ______________________________ Duke Salvia, MD, [facility]    CB/MEDQ  D:  12/31/2010  T:  01/01/2011  Job:  [id number]  Electronically Signed by Nicolasa Ducking ANP on 01/01/2011 03:09:38 PM Electronically Signed by Sherryl Manges MD [facility] on 01/05/2011 01:55:05 PM

## 2011-01-05 NOTE — Progress Notes (Signed)
Subjective:    Patient ID: [name], male    DOB: 11/02/1950, 60 y.o.   MRN: [id number]  HPI He was hospitalized for 3 days after his defibrillator went off 4 times; he was aware of 3 of those episodes. The emergency room records 12/31/10 were reviewed. Troponin was 0.31 (normal less than 0.08) and CPK 600 with an MB fraction of 9.0. His glucose at that time was 121; he states he was fasting. Additionally a mild anemia with a hematocrit of 38. Since discharge, he denies chest pain or firing of defibrillator. He felt he had tachycardia last night but the pulse measured 75 when checked. Diabetes status assessment: Fasting or morning glucose range:  Not checked Hypoglycemia :  Occasionally; usually mid day .                                                     Excess thirst :no;  Excess hunger:  no ;  Excess urination:  yes.                                  Lightheadedness with standing:  occasionall. Pain in  calves with walking:  no .                                                                                                                                Non healing skin  ulcers or sores,especially over the feet:  no. Numbness or tingling or burning in feet : burning R foot; evaluated by Podiatrist                                                                                                                                              Significant change in  Weight : no. Vision changes : occasional blurred  .  Exercise : no . Nutrition/diet:  no. Medication compliance : yes. Medication adverse  Effects:  no . Eye exam : due . Foot care : see above.  A1c/ urine microalbumin monitor:  ?       Review of Systems he denies epistaxis, hemoptysis, melena, rectal bleeding, hematuria, or abnormal bruising.  He's had intermittent upper thoracic back pain for several weeks;?related to lifting @ work. This responds to position  change. He denies excess ingestion of nonsteroidals.      Objective:   Physical Exam Gen.: Healthy and well-nourished in appearance. Alert, appropriate and cooperative throughout exam. Lungs: Normal respiratory effort; chest expands symmetrically. Lungs are clear to auscultation without rales, wheezes, or increased work of breathing. Heart: Normal rate and rhythm. Normal S1 and S2. No gallop, click, or rub. S4 w/o  murmur. Abdomen: Bowel sounds normal; abdomen soft and nontender. No masses, organomegaly or hernias noted. No AAA or bruits                                                                                Musculoskeletal/extremities: No deformity or scoliosis noted of  the thoracic or lumbar spine. No clubbing, cyanosis, edema, or deformity noted. Pain rising from exam table..Joints normal. Nail health  good. Vascular: Carotid, radial artery, dorsalis pedis and  posterior tibial pulses are full and equal. No bruits present. Neurologic: Alert and oriented x3. Deep tendon reflexes symmetrical and normal.          Skin: Intact without suspicious lesions or rashes. Psych: Mood and affect are normal. Normally interactive                                                                                         Assessment & Plan: #1 status post defibrillator firing with mild elevation of troponin and CK CK    CK #2 DM, ? Status #3upper mechanical back pain See orders & recommendations

## 2011-01-06 ENCOUNTER — Other Ambulatory Visit: Payer: Self-pay

## 2011-01-06 ENCOUNTER — Telehealth: Payer: Self-pay | Admitting: Internal Medicine

## 2011-01-06 ENCOUNTER — Encounter (HOSPITAL_BASED_OUTPATIENT_CLINIC_OR_DEPARTMENT_OTHER): Payer: Self-pay

## 2011-01-06 ENCOUNTER — Emergency Department (INDEPENDENT_AMBULATORY_CARE_PROVIDER_SITE_OTHER): Payer: Managed Care, Other (non HMO)

## 2011-01-06 ENCOUNTER — Emergency Department (HOSPITAL_BASED_OUTPATIENT_CLINIC_OR_DEPARTMENT_OTHER)
Admission: EM | Admit: 2011-01-06 | Discharge: 2011-01-06 | Disposition: A | Discharge status: Home / Self Care | Payer: Managed Care, Other (non HMO) | Attending: Emergency Medicine | Admitting: Emergency Medicine

## 2011-01-06 DIAGNOSIS — E119 Type 2 diabetes mellitus without complications: Secondary | ICD-10-CM | POA: Insufficient documentation

## 2011-01-06 DIAGNOSIS — R42 Dizziness and giddiness: Secondary | ICD-10-CM | POA: Insufficient documentation

## 2011-01-06 DIAGNOSIS — Z95810 Presence of automatic (implantable) cardiac defibrillator: Secondary | ICD-10-CM | POA: Insufficient documentation

## 2011-01-06 DIAGNOSIS — I10 Essential (primary) hypertension: Secondary | ICD-10-CM | POA: Insufficient documentation

## 2011-01-06 DIAGNOSIS — M25519 Pain in unspecified shoulder: Secondary | ICD-10-CM

## 2011-01-06 DIAGNOSIS — I4891 Unspecified atrial fibrillation: Secondary | ICD-10-CM | POA: Insufficient documentation

## 2011-01-06 DIAGNOSIS — I2510 Atherosclerotic heart disease of native coronary artery without angina pectoris: Secondary | ICD-10-CM | POA: Insufficient documentation

## 2011-01-06 HISTORY — DX: Essential (primary) hypertension: I10

## 2011-01-06 HISTORY — DX: Malignant neoplasm of prostate: C61

## 2011-01-06 LAB — DIFFERENTIAL
Basophils Absolute: 0 10*3/uL (ref 0.0–0.1)
Eosinophils Relative: 1 % (ref 0–5)
Lymphocytes Relative: 17 % (ref 12–46)
Lymphs Abs: 1.4 10*3/uL (ref 0.7–4.0)
Monocytes Absolute: 0.5 10*3/uL (ref 0.1–1.0)
Monocytes Relative: 6 % (ref 3–12)
Neutro Abs: 6.2 10*3/uL (ref 1.7–7.7)

## 2011-01-06 LAB — CBC
HCT: 39.3 % (ref 39.0–52.0)
Hemoglobin: 12.8 g/dL — ABNORMAL LOW (ref 13.0–17.0)
MCV: 70.7 fL — ABNORMAL LOW (ref 78.0–100.0)
RBC: 5.56 MIL/uL (ref 4.22–5.81)
WBC: 8.2 10*3/uL (ref 4.0–10.5)

## 2011-01-06 LAB — CARDIAC PANEL(CRET KIN+CKTOT+MB+TROPI)
Relative Index: 1.6 (ref 0.0–2.5)
Troponin I: <0.3 ng/mL (ref <0.30)

## 2011-01-06 MED ORDER — HYDROCODONE-ACETAMINOPHEN 5-325 MG PO TABS: 2.0000 | ORAL_TABLET | Freq: Four times a day (QID) | ORAL | Status: AC | PRN

## 2011-01-06 MED ORDER — ACETAMINOPHEN 325 MG PO TABS
650.0000 mg | ORAL_TABLET | Freq: Once | ORAL | Status: AC
  Administered 2011-01-06: 650 mg via ORAL
  Filled 2011-01-06: qty 2

## 2011-01-06 NOTE — ED Notes (Signed)
He reports "numbness from neck down to arm".  Also seen by PMD yesterday for back pain and given Rx.

## 2011-01-06 NOTE — ED Notes (Signed)
Continuation of secondary assessment.  Pt denies chest pain, SHOB or diaphoresis.  He was d/c'd from [facility] Sunday after defibrillator fired.  He also reports dizziness and "numbness" in legs, gait appears WNL.

## 2011-01-06 NOTE — ED Notes (Signed)
MD at bedside. 

## 2011-01-06 NOTE — Telephone Encounter (Signed)
Pt calls this morning with complaints of numbness in left arm, face, and left leg this morning.  He stated he also had some tightness in chest.  Pt does also c/o of dizziness sitting/standing. No slurred speech, no facial drooping according to wife.   Pt also reports diastolic bp was 111. He did not know the systolic #. He has taken his morning medications including aspirin.  He understands the symptoms may be indicative of stroke or tia.   He was advised to call 911. He is at his mother-in-law's with his wife. After talking with him further, he really did not want to go to hospital but it appears wife will take him.   Mylo Red RN

## 2011-01-06 NOTE — ED Notes (Signed)
Pt reports onset of left sided neck pain radiating to left arm.  He reports restless legs that started Saturday.  Recent admission to [facility] for defibrillator/AF.

## 2011-01-06 NOTE — ED Provider Notes (Signed)
History    chief complaints: Space left shoulder dysesthesia. Patient was discharged from a hospital 3 days prior to his ED presentation. He has a history of dysrhythmia for which he has a pacer defibrillator in place. He presented to: EEG 6 days prior to today. He was admitted for 3 days. He was evaluated with an echocardiogram and had his defibrillator adjusted. He notes that since he's been home he has had "restless legs" but has't been otherwise been well until today. This a.m. he awoke with left shoulder dysesthesia. He describes a non-sensation and heaviness running the length of his left arm focal discomfort near the superior edge. No new weakness. He describes it as though he's been using the arm excessively. No chest pain. Her shortness of breath. No nausea. No vomiting. No new activities. He states that this pain is similar but different from what he had last weekwas going off.  CSN: [id number] Arrival date & time: 01/06/2011  9:56 AM  Chief Complaint  Patient presents with  . Dizziness  . Arm Pain   Patient is a 60 y.o. male presenting with arm pain.  Arm Pain Pertinent negatives include no chest pain, no headaches and no shortness of breath.    Past Medical History  Diagnosis Date  . Atrial fibrillation   . Hypertension   . Diabetes mellitus   . Coronary artery disease   . Prostate cancer     Past Surgical History  Procedure Date  . Pacemaker insertion   . Prostatectomy   . Rectal surgery     No family history on file.  History  Substance Use Topics  . Smoking status: Never Smoker   . Smokeless tobacco: Not on file  . Alcohol Use: No      Review of Systems  Constitutional: Negative for fever and chills.  HENT: Negative.  Negative for sore throat.   Eyes: Negative.  Negative for visual disturbance.  Respiratory: Negative for shortness of breath.   Cardiovascular: Negative for chest pain.  Gastrointestinal: Negative.  Negative for nausea.  Genitourinary:  Negative for dysuria.  Musculoskeletal: Positive for myalgias.  Neurological: Negative for headaches.  Psychiatric/Behavioral: Negative.     Physical Exam  BP 147/97  Pulse 66  Temp(Src) 98.1 F (36.7 C) (Oral)  Resp 16  Ht 5\' 11"  (1.803 m)  Wt 230 lb (104.327 kg)  BMI 32.08 kg/m2  SpO2 99%  Physical Exam  Constitutional: He is oriented to person, place, and time. He appears well-developed and well-nourished.  HENT:  Head: Normocephalic and atraumatic.  Eyes: Conjunctivae are normal. Pupils are equal, round, and reactive to light.  Neck: Neck supple.  Cardiovascular: Normal rate and regular rhythm.   Pulmonary/Chest: No respiratory distress.       L sided pacer/defib palpable  Abdominal: Soft. There is no tenderness.  Musculoskeletal: He exhibits no edema.       ROM and strength of each shoulder is wnl  / 5/5.  TTP about the L medial mid scapula edge w palpable muscle tightness.  Neurological: He is alert and oriented to person, place, and time.  Skin: Skin is warm and dry.  Psychiatric: He has a normal mood and affect.    ED Course  Procedures  Date: 01/06/2011  Rate: 70   Rhythm: normal sinus rhythm  QRS Axis: left  Intervals: normal  ST/T Wave abnormalities: nonspecific T wave changes  Conduction Disutrbances:none  Narrative Interpretation:   Old EKG Reviewed: none available  XR Shoulder: No acute  changes, MDM 60 year old male with history of CAD recently admitted for defibrillator firing, now at home for 3 days, presenting with left shoulder pain. The pain is nonexertional but is positional, and with no associated complaints, and with the temporal proximity to the recent firing of the pacemaker with similar pain (a slightly different) time around her pacemaker cuff in addition to the unremarkable labs nonischemic EKG and x-ray that is unremarkable in all reassuring. Patient had some relief with analgesia in the. He was discharged to follow up with primary care  doctor and cardiologist. He was given return precautions, though this seems like a very unlikely episode of CAD, in view of the pain, again been on remarkable since onset, he does have history of CAD emesis noted.       Gerhard Munch, MD 01/06/11 [phone]

## 2011-01-06 NOTE — Telephone Encounter (Signed)
Pt having some left chest and arm pain, since 7am, some dizziness, no sob, try home number first, wife said if they go out can reach at [phone]

## 2011-01-08 ENCOUNTER — Telehealth: Payer: Self-pay

## 2011-01-08 NOTE — Telephone Encounter (Signed)
Spoke with patient, patient will get first b12 at follow-up appointment tomorrow

## 2011-01-08 NOTE — Telephone Encounter (Signed)
Message copied by Edgardo Roys on Thu Jan 08, 2011  4:41 PM ------      Message from: Pecola Lawless      Created: Wed Jan 07, 2011  4:26 PM       Poor Diabetes control present.       The A1c test is used primarily to monitor the glucose control of diabetics over time. The goal of those with diabetes is to keep their blood glucose levels as close to normal as possible. This helps to minimize the complications caused by chronically elevated glucose levels, such as progressive damage to body organs like the kidneys, eyes, cardiovascular system, and nerves. The A1c test gives a picture of the average amount of glucose in the blood over the last few months. It can help a patient and his doctor know if the measures they are taking to control the patient's diabetes are successful or need to be adjusted.          NORMAL VALUES       Non diabetic adults: 5 %-6.1%       Good diabetic control: 6.2-6.4 %       Fair diabetic control: 6.5-7%       Poor diabetic control: greater than 7 % ( except with additional factors such as  advanced age; significant coronary or neurologic disease,etc). Check the A1c in  3 months after nutrition changes described below. Goals for home glucose monitoring are : fasting  or morning glucose goal of  90-150. Two hours after any meal , goal = < 180, preferably < 150.      Eat a low-fat diet with lots of fruits and vegetables, up to 7-9 servings per day. Avoid obesity; your goal is waist measurement < 40 inches.Consume less than 40 grams of sugar per day from foods & drinks with High Fructose Corn Sugar as #1,2,3 or # 4 on label.      Follow the low carb nutrition program in [employer] as closely as possible to prevent Diabetes progression & complications. White carbohydrates (potatoes, rice, bread, and pasta) have a high spike of sugar and a high load of sugar. For example a  baked potato has a cup of sugar and a  french fry  2 teaspoons of sugar. Yams, wild   rice, whole grained bread &  wheat pasta have been much lower spike and load of  sugar. Portions should be the size of a deck of cards or your palm.             B12 deficiency anemia is present. Please take B12 shots, weekly x4 then monthly. Recheck B12 level in 6 months (281.0)

## 2011-01-09 ENCOUNTER — Telehealth: Payer: Self-pay | Admitting: Internal Medicine

## 2011-01-09 ENCOUNTER — Ambulatory Visit (INDEPENDENT_AMBULATORY_CARE_PROVIDER_SITE_OTHER): Payer: Managed Care, Other (non HMO) | Admitting: Internal Medicine

## 2011-01-09 DIAGNOSIS — R209 Unspecified disturbances of skin sensation: Secondary | ICD-10-CM

## 2011-01-09 DIAGNOSIS — D510 Vitamin B12 deficiency anemia due to intrinsic factor deficiency: Secondary | ICD-10-CM

## 2011-01-09 DIAGNOSIS — R202 Paresthesia of skin: Secondary | ICD-10-CM

## 2011-01-09 MED ORDER — CYANOCOBALAMIN 1000 MCG/ML IJ SOLN
1000.0000 ug | Freq: Once | INTRAMUSCULAR | Status: AC
  Administered 2011-01-09: 1000 ug via INTRAMUSCULAR

## 2011-01-09 MED ORDER — GLUCOSE BLOOD VI STRP: ORAL_STRIP | Status: AC

## 2011-01-09 MED ORDER — ONETOUCH ULTRASOFT LANCETS MISC: Status: AC

## 2011-01-09 NOTE — Progress Notes (Signed)
Subjective:    Patient ID: [name], male    DOB: [birth year], 60 y.o.   MRN: [id number]  HPI  #1 He was seen in  emergency room 01/06/2011 21 for numbness of entire  left side of his body from his head to the foot. This lasted approximately 4 hours. Those records 7 lab results reviewed & discussed were reviewed.  He has also had some restless leg syndrome symptoms as well.   Trigger/exacerbating factors for the paresthesias:present upon awakening Treatment/response:no treatment Associated signs and symptoms: No fever/chills/sweats/weight loss/fatigue No visual changes No swallowing dysfunction No chest pain/palpitations/irregular rhythm  ( defibrillator recently checked)  Bowel changes: watery stool 8/28, ? Due to muscle relaxant No new hair/ skin/nail changes: No weakness/gait dysfunction/tremor Anxiety/depression/panic attacks:"I'm worried the defibrillator to come back on" No heat/cold intolerance No bruising/bleeding/lymphadenopathy   #2 Diabetes status assessment: Fasting or morning glucose range:"  Going to start (checking)" Hypoglycemia :  no .                                                     Excess thirst :no;  Excess hunger:  no ;  Excess urination:  Yes ? Due to prostate surgery.                                  Lightheadedness with standing:  Some , ? Due to taking all meds together in am Chest pain:  no ; Palpitations :not now ;  Pain in  calves with walking:  no .                                                                                                                                 Non healing skin  ulcers or sores,especially over the feet:  no. Numbness, burning   or tingling or burning in feet : burning monitored by Podiatrist .                                                                                                                                              Significant  change in  Weight : stable.                   Exercise : no . Nutrition/diet:  No plan. Medication compliance : yes. Medication adverse  Effects:  See lightheadedness above . Eye exam : 2009. Foot care : 7/12.  A1c/ urine microalbumin monitor:  7.2%                Review of Systems L parascapular pain intermittently since defibrillator fired. Xrays in ER  "negative"; muscle relaxant Rxed."Not ready to return to work"     Objective:   Physical Exam Gen.: Healthy and well-nourished in appearance. Alert, appropriate and cooperative throughout exam. Head: Normocephalic without obvious abnormalities Eyes: No corneal or conjunctival inflammation noted. Lungs: Normal respiratory effort; chest expands symmetrically. Lungs are clear to auscultation without rales, wheezes, or increased work of breathing. Heart: Normal rate and rhythm. Normal S1 and S2. No gallop, click, or rub. S4 w/o  murmur.                                                                                 Musculoskeletal/extremities: No deformity or scoliosis noted of  the thoracic or lumbar spine. No clubbing, cyanosis, edema, or deformity noted. Tone & strength  normal.Joints normal. Nail health  good. Vascular: Carotid, radial artery, dorsalis pedis and  posterior tibial pulses are full and equal. No bruits present. Neurologic: Alert and oriented x3. Deep tendon reflexes symmetrical and normal. Light touch normal normal over extremities          Skin: Intact without suspicious lesions or rashes. Lymph: No cervical, axillary  lymphadenopathy present. Psych: Mood and affect are normal. Normally interactive                                                                                         Assessment & Plan:  #1 paresthesias, left-sided, resolved. No neurologic deficit present on exam  #2 diabetes, poor control. Risk and options discussed  #3 hypertension, ongoing monitor necessary. Risks and goals discussed. #4 stress, exogenous. Fear  of defibrillator firing  Plan: See recommendations.

## 2011-01-09 NOTE — Telephone Encounter (Signed)
[name] patient. Will forward to [place].

## 2011-01-09 NOTE — Patient Instructions (Addendum)
Please  schedule Labs in 3 months  :A1c, urine microalbumin (250.02).Preventive Health Care: Exercise at least 30-45 minutes a day,  3-4 days a week.  Take 6 weeks to build up to this level. Your BP goal = AVERAGE < 135/85. Avoid ingestion of  excess salt/sodium.Cook with pepper & other spices . Use the salt substitute "No Salt"(unless your potassium has been elevated) OR the Mrs Sharilyn Sites products to season food @ the table. Avoid foods which taste salty or "vinegary" as their sodium contentet will be high.   Call Dr. Lubertha Basque nurse and verify how long  he wants you  to stay out of work.

## 2011-01-09 NOTE — Telephone Encounter (Signed)
lmom for patient to call me back. 

## 2011-01-09 NOTE — Telephone Encounter (Signed)
Pt wanted to know when should he return back to work. Pt see PA on 9/10 would like for date to be 9/12.

## 2011-01-11 ENCOUNTER — Other Ambulatory Visit: Payer: Self-pay | Admitting: Internal Medicine

## 2011-01-15 ENCOUNTER — Telehealth: Payer: Self-pay

## 2011-01-15 ENCOUNTER — Encounter (HOSPITAL_BASED_OUTPATIENT_CLINIC_OR_DEPARTMENT_OTHER): Payer: Self-pay

## 2011-01-15 ENCOUNTER — Emergency Department (HOSPITAL_BASED_OUTPATIENT_CLINIC_OR_DEPARTMENT_OTHER)
Admission: EM | Admit: 2011-01-15 | Discharge: 2011-01-15 | Disposition: A | Discharge status: Home / Self Care | Payer: Managed Care, Other (non HMO) | Attending: Emergency Medicine | Admitting: Emergency Medicine

## 2011-01-15 DIAGNOSIS — I10 Essential (primary) hypertension: Secondary | ICD-10-CM | POA: Insufficient documentation

## 2011-01-15 DIAGNOSIS — I4891 Unspecified atrial fibrillation: Secondary | ICD-10-CM | POA: Insufficient documentation

## 2011-01-15 DIAGNOSIS — E119 Type 2 diabetes mellitus without complications: Secondary | ICD-10-CM | POA: Insufficient documentation

## 2011-01-15 DIAGNOSIS — I2510 Atherosclerotic heart disease of native coronary artery without angina pectoris: Secondary | ICD-10-CM | POA: Insufficient documentation

## 2011-01-15 NOTE — ED Provider Notes (Signed)
History     CSN: [id number] Arrival date & time: 01/15/2011  1:58 PM  Chief Complaint  Patient presents with  . Hypertension   HPI Comments: The patient is a 60 has hypertension and is also diabetic, and has atrial fibrillation. He has a defibrillator implanted. This had recently fired. Since D., 3 days ago, his blood pressure is been going up and down today his systolic blood pressure was as high as 187 and was very dizzy he therefore sought evaluation. He is on Ramipril 10 mg twice a day and carvedilol 12.5 mg twice a day  Patient is a 60 y.o. male presenting with hypertension. The history is provided by the patient and medical records. No language interpreter was used.  Hypertension This is a chronic problem. The current episode started more than 2 days ago. Episode frequency: He has been having spikes of high blood pressure for the past 3 days. The problem has been gradually worsening. Pertinent negatives include no chest pain, no abdominal pain, no headaches and no shortness of breath. Associated symptoms comments:  He has experienced dizziness when his blood pressure was high.. The symptoms are aggravated by nothing. The symptoms are relieved by nothing. Treatments tried: He has been taking his medications faithfully.    Past Medical History  Diagnosis Date  . Atrial fibrillation   . Hypertension   . Diabetes mellitus   . Coronary artery disease   . Prostate cancer     Past Surgical History  Procedure Date  . Pacemaker insertion   . Prostatectomy   . Rectal surgery     No family history on file.  History  Substance Use Topics  . Smoking status: Never Smoker   . Smokeless tobacco: Not on file  . Alcohol Use: No      Review of Systems  Constitutional: Negative.   HENT: Negative.   Eyes: Negative.   Respiratory: Negative for shortness of breath.   Cardiovascular: Negative for chest pain.  Gastrointestinal: Negative.  Negative for abdominal pain.  Genitourinary:  Negative.   Musculoskeletal: Negative.   Neurological: Negative.  Negative for headaches.  Psychiatric/Behavioral: Negative.     Physical Exam  BP 148/82  Pulse 70  Temp(Src) 98.3 F (36.8 C) (Oral)  Resp 16  Ht 5\' 11"  (1.803 m)  Wt 230 lb (104.327 kg)  BMI 32.08 kg/m2  SpO2 100%  Physical Exam  Nursing note and vitals reviewed. Constitutional: He is oriented to person, place, and time. He appears well-developed and well-nourished.       He is in no distress. However, he is very concerned about his blood pressure.  HENT:  Head: Normocephalic and atraumatic.  Right Ear: External ear normal.  Left Ear: External ear normal.  Mouth/Throat: Oropharynx is clear and moist.  Eyes: Conjunctivae and EOM are normal. Pupils are equal, round, and reactive to light.  Neck: Normal range of motion. Neck supple.       No carotid bruit.  Cardiovascular: Normal rate, regular rhythm and normal heart sounds.   Pulmonary/Chest: Effort normal and breath sounds normal.  Abdominal: Soft. Bowel sounds are normal.  Lymphadenopathy:    He has no cervical adenopathy.  Neurological: He is alert and oriented to person, place, and time.       No sensory or motor deficit.  Skin: Skin is warm and dry.  Psychiatric: He has a normal mood and affect. His behavior is normal.    ED Course  Procedures  Course in the ED, and  the patient was seen and had physical exam. I contacted Marga Melnick M.D., and his internist. We discussed his situation. Dr. Alwyn Ren advised continuing the patient on his same medications. The patient has an appointment to see him in the office tomorrow, at which time he can make adjustments if needed.    Carleene Cooper III, MD 01/15/11 [phone]

## 2011-01-15 NOTE — Telephone Encounter (Signed)
Spoke with pt and he is at the ER getting evaluated.  Pt still request to have an appt. With Dr. Alwyn Ren.  Pt is aware he has an appt at 3 pm on 01/16/11.

## 2011-01-15 NOTE — ED Notes (Signed)
Pt stated to EMT [facility] that "I feel like my pressure is up again". MD aware of pt's BP.

## 2011-01-15 NOTE — ED Notes (Signed)
Pt denies chest pain, n/v, shob. Pt sts his BP medication was recently changed to every 12 hrs. Pt sts his PCP could not see him today. Pt sts he has been having episodes of dizziness with 3 episodes "coming and going today".

## 2011-01-15 NOTE — ED Notes (Signed)
Pt currently denies dizziness and sts he feels "fine".

## 2011-01-15 NOTE — ED Notes (Signed)
C/o elevated BP,dizziness

## 2011-01-15 NOTE — Telephone Encounter (Signed)
Pt states his bp has been spiking over the past couple days; today it is 192/88 but it has usually been in the 170's according to the pt.  Pt states he is feeling dizzy and has been taking his medication.  Pt would like to know if he could be seen today or if he needs to go to the ER.  Pls advise.

## 2011-01-16 ENCOUNTER — Ambulatory Visit (INDEPENDENT_AMBULATORY_CARE_PROVIDER_SITE_OTHER): Payer: Managed Care, Other (non HMO) | Admitting: Internal Medicine

## 2011-01-16 ENCOUNTER — Ambulatory Visit: Payer: Managed Care, Other (non HMO)

## 2011-01-16 VITALS — BP 152/96 | HR 78 | Wt 231.2 lb

## 2011-01-16 DIAGNOSIS — D518 Other vitamin B12 deficiency anemias: Secondary | ICD-10-CM

## 2011-01-16 DIAGNOSIS — D519 Vitamin B12 deficiency anemia, unspecified: Secondary | ICD-10-CM

## 2011-01-16 DIAGNOSIS — I10 Essential (primary) hypertension: Secondary | ICD-10-CM

## 2011-01-16 MED ORDER — CYANOCOBALAMIN 1000 MCG/ML IJ SOLN
1000.0000 ug | Freq: Once | INTRAMUSCULAR | Status: AC
  Administered 2011-01-16: 1000 ug via INTRAMUSCULAR

## 2011-01-16 MED ORDER — CARVEDILOL 12.5 MG PO TABS: 12.5000 mg | ORAL_TABLET | Freq: Two times a day (BID) | ORAL | Status: DC

## 2011-01-16 MED ORDER — LORAZEPAM 0.5 MG PO TABS: 0.5000 mg | ORAL_TABLET | ORAL | Status: DC

## 2011-01-16 NOTE — Progress Notes (Signed)
Addended by: Edgardo Roys on: 01/16/2011 04:09 PM   Modules accepted: Orders

## 2011-01-16 NOTE — Patient Instructions (Addendum)
Eat a low-fat diet with lots of fruits and vegetables, up to 7-9 servings per day. Avoid obesity; your goal is waist measurement < 40 inches.Consume less than 40 grams of sugar per day from foods & drinks with High Fructose Corn Sugar as #1,2,3 or # 4 on label. Follow the low carb nutrition program in [employer] as closely as possible to prevent Diabetes progression & complications. White carbohydrates (potatoes, rice, bread, and pasta) have a high spike of sugar and a high load of sugar. For example a  baked potato has a cup of sugar and a  french fry  2 teaspoons of sugar. Yams, wild  rice, whole grained bread &  wheat pasta have been much lower spike and load of  sugar. Portions should be the size of a deck of cards or your palm.  If the blood pressure increases dramatically; collect all urine for 24 hours for stress hormone measurement. Take the lorazepam every 8 hours if needed. Blood Pressure Goal  Ideally is an AVERAGE < 135/85. This AVERAGE should be calculated from @ least 5-7 BP readings taken @ different times of day on different days of week. You should not respond to isolated BP readings , but rather the AVERAGE for that week

## 2011-01-16 NOTE — Progress Notes (Signed)
  Subjective:    Patient ID: [name], male    DOB: Feb 02, 1951, 60 y.o.   MRN: [id number]  HPI  HYPERTENSION:  BP elevation @ [address]  was  Initially 177/?; he went there because he was dizzy. He has noted the dizziness as a prodrome to documented elevated blood pressure. He denies a constellation of headaches, flushing, chest pain, and diarrhea. Disease Monitoring  Blood pressure range: 145/88- 192/101  Chest pain: no   Dyspnea: no   Claudication: no   Medication compliance: yes, Carvedilol 12.5 mg twice a day  Medication Side Effects  Lightheadedness: yes, especially approx 10 am   Urinary frequency: no   Edema: no    Preventitive Healthcare:  Exercise: no   Diet Pattern: no plan  Salt Restriction: no      Review of Systems     Objective:   Physical Exam  General appearance is one of good health and nourishment w/o distress.  Eyes: No conjunctival inflammation ; cataract appear to be present on the left. Fundal evaluation is difficult. Heart:  Normal rate and regular rhythm. S1 and S2 normal without gallop, murmur, click, rub or other extra sounds     Lungs:Chest clear to auscultation; no wheezes, rhonchi,rales ,or rubs present.No increased work of breathing.   Abdomen: bowel sounds normal, soft and non-tender without masses, organomegaly or hernias noted.  No guarding or rebound . He has no renal artery bruits; there is no aortic enlargement.  Skin:Warm & dry.  Intact without suspicious lesions or rashes  Vascular: All pulses intact without bruits or deficits.             Assessment & Plan:  #1 hypertension, paroxysmal elevation with associated dizziness  Plan: #1 titrate carvedilol every 3-5 days as needed for blood pressure control  #2 24-hour urine for catecholamines and metanephrines if the symptoms recur.  #3 low-dose lorazepam as needed.

## 2011-01-18 NOTE — Telephone Encounter (Signed)
Agree with recommendations.  

## 2011-01-19 ENCOUNTER — Encounter: Payer: Self-pay | Admitting: Physician Assistant

## 2011-01-19 ENCOUNTER — Ambulatory Visit (INDEPENDENT_AMBULATORY_CARE_PROVIDER_SITE_OTHER): Payer: Managed Care, Other (non HMO) | Admitting: *Deleted

## 2011-01-19 ENCOUNTER — Encounter: Payer: Self-pay | Admitting: *Deleted

## 2011-01-19 ENCOUNTER — Encounter: Payer: Self-pay | Admitting: Internal Medicine

## 2011-01-19 ENCOUNTER — Ambulatory Visit (INDEPENDENT_AMBULATORY_CARE_PROVIDER_SITE_OTHER): Payer: Managed Care, Other (non HMO) | Admitting: Physician Assistant

## 2011-01-19 VITALS — BP 149/88 | HR 80 | Ht 71.0 in | Wt 227.0 lb

## 2011-01-19 DIAGNOSIS — I472 Ventricular tachycardia, unspecified: Secondary | ICD-10-CM

## 2011-01-19 DIAGNOSIS — R42 Dizziness and giddiness: Secondary | ICD-10-CM

## 2011-01-19 DIAGNOSIS — I428 Other cardiomyopathies: Secondary | ICD-10-CM

## 2011-01-19 DIAGNOSIS — Z8679 Personal history of other diseases of the circulatory system: Secondary | ICD-10-CM | POA: Insufficient documentation

## 2011-01-19 DIAGNOSIS — I10 Essential (primary) hypertension: Secondary | ICD-10-CM

## 2011-01-19 DIAGNOSIS — I2589 Other forms of chronic ischemic heart disease: Secondary | ICD-10-CM

## 2011-01-19 HISTORY — DX: Dizziness and giddiness: R42

## 2011-01-19 NOTE — Assessment & Plan Note (Addendum)
Etiology unclear.  He has had a consternation of symptoms lately.  I spent over 45 minutes discussing his symptoms with him today.  He is not orthostatic.  His device was interrogated and has only had 2 NSVT episode since he was in the hospital at 5 beats only.  So, I do not think he is symptomatic from this.  He did mention that he was given a 24-hour urine to check for catecholamines.  I think that this is reasonable.  He also was given lorazepam to take.  In retrospect, the way he describes his dizziness, he may be describing panic episodes.  He does describe an episode of dizziness that caused him to leave a store abruptly.  He denies syncope or near-syncope.  He had an episode last night and took a half a tablet of lorazepam and fell asleep.  I have asked him to continue to take the lorazepam as needed.  I will try to discuss his case further with Dr. Ladona Ridgel.  Some of his symptoms could be side effects from sotalol.  However, am somewhat doubtful of this.  Also, I would not feel comfortable changing his medicines without Dr. Ladona Ridgel being involved.  I discussed this with the patient and he understands.  I think some of his dizziness may have been created by his uncontrolled blood pressure.  At this point, his blood pressure seems to be better controlled.  I do not think increasing his medications further would be beneficial at this point.  Certainly, if his blood pressure remains uncontrolled, we could increase his medications further.  It might be prudent to hold off on adjusting his beta blocker further given his current symptoms.  I will have him followup with Dr. Ladona Ridgel in the next one to 2 weeks.  I have given him a note to stay out of work until he returns for followup.  He is concerned that he would not be able to work with his current state of dizziness.  I will also check a BMET to make sure he is not pre-renal.

## 2011-01-19 NOTE — Patient Instructions (Addendum)
Your physician recommends that you schedule a follow-up appointment in: 1-2 WEEKS WITH DR. Ladona Ridgel AS PER SCOTT WEAVER, PA-C  Your physician recommends that you return for lab work in: TODAY BMET   YOU HAVE BEEN GIVE A WORK NOTE TODAY

## 2011-01-19 NOTE — Progress Notes (Signed)
icd interrogation only  

## 2011-01-19 NOTE — Telephone Encounter (Signed)
Will see Dr  Ladona Ridgel on 01/27/11

## 2011-01-19 NOTE — Telephone Encounter (Signed)
Patient was seen by Lorin Picket and needs to be seen by Dr Ladona Ridgel for permission to go back to work

## 2011-01-19 NOTE — Progress Notes (Signed)
History of Present Illness: Primary Electrophysiologist:  Dr. Lewayne Bunting   [name] is a 60 y.o. male who presents for post hospital follow up.    He has a history of a NICM, EF 25-30%, symptomatic VTach, status post AICD implantation, normal coronary arteries by catheterization 5/05, chronic systolic CHF, HTN, HLP, DM2, OSA and prostate cancer status post prostatectomy.  He was admitted 8/22-8/25 with syncope in the setting of multiple ICD shocks.  He was noted to be in ventricular tachycardia.  Of note, his Coreg CR had run out of about a week prior to his episode.  He had a mild troponin bump that was felt to be related to his ICD shocks.  He was placed on sotalol.  His Coreg was changed to short acting and decreased.  His digoxin was also decreased.  Recent Labs: Hemoglobin 12.8, potassium 3.8, creatinine 0.87, hemoglobin A1c 7.2, peak troponin 2.08, peak CK-MB 12.8, TSH 2.446.  Chest x-ray without any acute changes. Echo 01/01/11: EF 25-30%, mild LVH, grade 1 diast dysfxn, mild LAE, PASP 34.  Since discharge he has had problems with dizziness and his BP.  He notes surges in his BP and has recorded readings as high as 192/88.  He has seen Dr. Alwyn Ren on a couple of occassions and his coreg was increased from 12.5 to 18.75 mg BID.  He feels more dizzy since then.  He has been to the ED on a couple occasions as well.  He had some numbness in his face and arm one time.  He denies facial droop, difficulty with speech or unilateral weakness.  We checked his orthostatics here and he does not have a BP drop or HR increase with lying to standing.  He denies syncope.  Only feels lightheaded.  No chest pain or dyspnea.  No orthopnea, PND or edema.  No ICD shocks.  No palpitations.     Past Medical History  Diagnosis Date  . Ventricular tachycardia     s/p AICD;  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 and placed on sotalol  . Hypertension   . Diabetes mellitus   . NICM (nonischemic cardiomyopathy)     normal cors by  cath in 2005  . Prostate cancer     s/p prostatectomy  . Sleep apnea   . Hyperlipidemia   . Chronic systolic heart failure     Current Outpatient Prescriptions  Medication Sig Dispense Refill  . atorvastatin (LIPITOR) 20 MG tablet Take 20 mg by mouth daily.        . carvedilol (COREG) 12.5 MG tablet One & 1/2 half pills(18.75 mg) twice a day       . digoxin (LANOXIN) 0.125 MG tablet Take 125 mcg by mouth daily.        [name] HYDROcodone-acetaminophen (NORCO) 5-325 MG per tablet As needed      . JANUMET 50-1000 MG per tablet TAKE 1 TABLET BY MOUTH 2 TIMES DAILY WITH A MEAL.  60 tablet  0  . Lancets (ONETOUCH ULTRASOFT) lancets PRN  100 each  11  . LORazepam (ATIVAN) 0.5 MG tablet Take 1 tablet (0.5 mg total) by mouth as directed. One every 8 hours as needed for acute rise in blood pressure  30 tablet  0  . ramipril (ALTACE) 10 MG capsule Take 1 capsule (10 mg total) by mouth 2 (two) times daily.  60 capsule  1  . sotalol (BETAPACE) 120 MG tablet Take 120 mg by mouth 2 (two) times daily.        [name]  traMADol (ULTRAM) 50 MG tablet Take 1 tablet (50 mg total) by mouth every 6 (six) hours as needed for pain.  30 tablet  0  . urea (CARMOL) 40 % CREA As dircted      . glucose blood test strip Use as instructed  100 each  11    Allergies: No Known Allergies  Social Hx:  Non smoker  ROS:  Please see the history of present illness.  He has difficulty sleeping.  All other systems reviewed and negative.   Vital Signs: BP 149/88  Pulse 80  Ht 5\' 11"  (1.803 m)  Wt 227 lb (102.967 kg)  BMI 31.66 kg/m2   Filed Vitals:   01/19/11 1248 01/19/11 1249 01/19/11 1250 01/19/11 1251  BP: 138/87 136/84 141/88 149/88  Pulse: 79 80 80 80  Height:    5\' 11"  (1.803 m)  Weight:    227 lb (102.967 kg)     PHYSICAL EXAM: Well nourished, well developed, in no acute distress HEENT: normal Neck: no JVD Cardiac:  normal S1, S2; RRR; no murmur Lungs:  clear to auscultation bilaterally, no wheezing, rhonchi or  rales Abd: soft, nontender, no hepatomegaly Ext: no edema Skin: warm and dry Neuro:  CNs 2-12 intact, no focal abnormalities noted Psych: normal affect  EKG:  Sinus rhythm, heart rate 76, normal axis, nonspecific ST-T wave changes, no significant changes compared to prior tracings  ASSESSMENT AND PLAN:

## 2011-01-19 NOTE — Assessment & Plan Note (Signed)
As noted, we interrogated his device and his VT is fairly quiescent.

## 2011-01-19 NOTE — Assessment & Plan Note (Signed)
Volume stable. 

## 2011-01-19 NOTE — Assessment & Plan Note (Signed)
As outlined above °

## 2011-01-20 ENCOUNTER — Encounter: Payer: Self-pay | Admitting: Internal Medicine

## 2011-01-20 ENCOUNTER — Ambulatory Visit (INDEPENDENT_AMBULATORY_CARE_PROVIDER_SITE_OTHER): Payer: Managed Care, Other (non HMO) | Admitting: Internal Medicine

## 2011-01-20 DIAGNOSIS — Z1211 Encounter for screening for malignant neoplasm of colon: Secondary | ICD-10-CM

## 2011-01-20 DIAGNOSIS — D649 Anemia, unspecified: Secondary | ICD-10-CM | POA: Insufficient documentation

## 2011-01-20 DIAGNOSIS — Z86010 Personal history of colonic polyps: Secondary | ICD-10-CM

## 2011-01-20 DIAGNOSIS — K2270 Barrett's esophagus without dysplasia: Secondary | ICD-10-CM

## 2011-01-20 HISTORY — DX: Anemia, unspecified: D64.9

## 2011-01-20 LAB — BASIC METABOLIC PANEL
BUN: 11 mg/dL (ref 6–23)
Chloride: 103 mEq/L (ref 96–112)
GFR: 105.4 mL/min (ref >60.00)
Glucose, Bld: 97 mg/dL (ref 70–99)
Potassium: 4.4 mEq/L (ref 3.5–5.1)
Sodium: 141 mEq/L (ref 135–145)

## 2011-01-20 MED ORDER — PEG-KCL-NACL-NASULF-NA ASC-C 100 G PO SOLR: 1.0000 | Freq: Once | ORAL | Status: DC

## 2011-01-20 NOTE — Assessment & Plan Note (Signed)
He is at an appropriate time for a repeat screening and surveillance colonoscopy. Risks benefits and indications were explained he understands and agrees to proceed.

## 2011-01-20 NOTE — Assessment & Plan Note (Addendum)
He is overdue for followup on this. I explained the risks benefits and indications of upper GI endoscopy to reassess. We'll schedule that. Not currently on a PPI. We'll readdress that after endoscopy.

## 2011-01-20 NOTE — Progress Notes (Signed)
Patient requested the date of Endo/Colon be changed. It was changed to 02/24/11 @ 10:0 am. New instructions mailed to the patient.

## 2011-01-20 NOTE — Progress Notes (Signed)
  Subjective:    Patient ID: [name], male    DOB: 10-Feb-1951, 60 y.o.   MRN: [id number]  HPI This is a pleasant 60 year old [place] American man known to me from previous procedures in 2006. He had a small adenomatous colon polyp and Barrett's esophagus. He has not yet followed up for these. At this time he has no major complaints other than some lightheadedness at times. He was recently admitted to the hospital with syncope related to firing of his defibrillator and improperly taking some of his medications. He is getting that worked out. He has no acid reflux or heartburn symptoms at this time to speak of. He denies dysphagia. He has occasional constipation with hard balls of stool about twice a month. The symptoms are overall chronic and unchanged. He was given Hemoccult cards by his primary care physician because of a chronic low-grade anemia, he has not yet completed those.   Review of Systems As above. Also has back pain, some urinary frequency at times. All other review of systems are negative.    Objective:   Physical Exam General: Well-developed, well-nourished and in no acute distress Vitals: Reviewed and listed above Eyes:anicteric. Mouth and posterior pharynx: normal.  Neck: supple w/o thyromegaly or mass.  Lungs: clear. Chest wall with AICD in left upper chest - infraclavicular Heart: S1S2, no rubs, murmurs, gallops. Abdomen: soft, non-tender, no hepatosplenomegaly, hernia, or mass and BS+.  Rectal: deferred Lymphatics: no cervical, Barclay or inguinal nodes. Extremities:  no edema Skin no rash.+ keloid on chest Neuro: nonfocal. A&O x 3.  Psych: appropriate mood and  affect.        Assessment & Plan:

## 2011-01-20 NOTE — Assessment & Plan Note (Signed)
Appears unchanged x # of years at least - can hold off on hemoccults as we will do EGD/colonoscopy (both screening procedures)

## 2011-01-20 NOTE — Patient Instructions (Addendum)
Do not do the hemoccult cards (stool tests for blood) unless Dr. Leone Payor tells you to.. You have been scheduled for an Endoscopy/Colonoscopy with separate instructions given. Your prep kit has been sent to your pharmacy for you to pick up. Please ask you Cardiologist if you should be on an Aspirin daily.

## 2011-01-21 ENCOUNTER — Encounter: Payer: Self-pay | Admitting: *Deleted

## 2011-01-22 ENCOUNTER — Ambulatory Visit (INDEPENDENT_AMBULATORY_CARE_PROVIDER_SITE_OTHER): Payer: Managed Care, Other (non HMO) | Admitting: *Deleted

## 2011-01-22 DIAGNOSIS — D649 Anemia, unspecified: Secondary | ICD-10-CM

## 2011-01-22 MED ORDER — CYANOCOBALAMIN 1000 MCG/ML IJ SOLN
1000.0000 ug | Freq: Once | INTRAMUSCULAR | Status: AC
  Administered 2011-01-22: 1000 ug via INTRAMUSCULAR

## 2011-01-26 ENCOUNTER — Encounter: Payer: Self-pay | Admitting: Internal Medicine

## 2011-01-27 ENCOUNTER — Other Ambulatory Visit: Payer: Self-pay | Admitting: Internal Medicine

## 2011-01-27 ENCOUNTER — Encounter: Payer: Self-pay | Admitting: Internal Medicine

## 2011-01-27 ENCOUNTER — Ambulatory Visit (INDEPENDENT_AMBULATORY_CARE_PROVIDER_SITE_OTHER): Payer: Managed Care, Other (non HMO) | Admitting: Internal Medicine

## 2011-01-27 DIAGNOSIS — Z95810 Presence of automatic (implantable) cardiac defibrillator: Secondary | ICD-10-CM

## 2011-01-27 DIAGNOSIS — I5022 Chronic systolic (congestive) heart failure: Secondary | ICD-10-CM | POA: Insufficient documentation

## 2011-01-27 DIAGNOSIS — I428 Other cardiomyopathies: Secondary | ICD-10-CM

## 2011-01-27 DIAGNOSIS — I472 Ventricular tachycardia: Secondary | ICD-10-CM

## 2011-01-27 MED ORDER — RAMIPRIL 10 MG PO CAPS: 10.0000 mg | ORAL_CAPSULE | Freq: Two times a day (BID) | ORAL | Status: DC

## 2011-01-27 NOTE — Telephone Encounter (Signed)
rx sent

## 2011-01-27 NOTE — Assessment & Plan Note (Signed)
His device is working normally. We'll plan to recheck in several months. 

## 2011-01-27 NOTE — Patient Instructions (Signed)
Your physician recommends that you schedule a follow-up appointment in: 3 months with Dr Taylor  

## 2011-01-27 NOTE — Assessment & Plan Note (Signed)
His symptoms are currently class 1-2. He will continue his current medical therapy. I have strongly urged him to maintain a low-sodium diet.

## 2011-01-27 NOTE — Assessment & Plan Note (Signed)
He has had no recurrent symptoms. He remains on sotalol. Because of his dizziness, I have asked him to decrease his dose to a half tablet in the morning and a whole tablet in the evening.

## 2011-01-27 NOTE — Progress Notes (Signed)
HPI Mr.Eschete returns today for followup. He is a very pleasant 60 year old man with a history of a nonischemic cardiomyopathy, chronic systolic heart failure, and ventricular tachycardia. The patient has been stable from an arrhythmia perspective since discharge from the hospital over a month ago. At that time he presented with ventricular tachycardia storm after being off of his beta blockers. He was begun on sotalol in addition to carvedilol. The patient has had his dose of carvedilol up titrated. His blood pressure has been elevated. The patient's main complaint today is dizziness. He has not had syncope. He notes that when he is dizzy, his blood pressure is not low. The dizziness that he has experienced occurred at the same time that he started taking sotalol. No Known Allergies   Current Outpatient Prescriptions  Medication Sig Dispense Refill  . atorvastatin (LIPITOR) 20 MG tablet Take 20 mg by mouth daily.        . carvedilol (COREG) 12.5 MG tablet One & 1/2 half pills(18.75 mg) twice a day       . digoxin (LANOXIN) 0.125 MG tablet Take 125 mcg by mouth daily.        [name] glucose blood test strip Use as instructed  100 each  11  . HYDROcodone-acetaminophen (NORCO) 5-325 MG per tablet As needed      . JANUMET 50-1000 MG per tablet TAKE 1 TABLET BY MOUTH 2 TIMES DAILY WITH A MEAL.  60 tablet  0  . Lancets (ONETOUCH ULTRASOFT) lancets PRN  100 each  11  . LORazepam (ATIVAN) 0.5 MG tablet Take 1 tablet (0.5 mg total) by mouth as directed. One every 8 hours as needed for acute rise in blood pressure  30 tablet  0  . peg 3350 powder (MOVIPREP) 100 G SOLR Take 1 kit (100 g total) by mouth once.  1 kit  0  . ramipril (ALTACE) 10 MG capsule Take 1 capsule (10 mg total) by mouth 2 (two) times daily.  60 capsule  1  . sotalol (BETAPACE) 120 MG tablet Take 120 mg by mouth 2 (two) times daily.        . traMADol (ULTRAM) 50 MG tablet Take 1 tablet (50 mg total) by mouth every 6 (six) hours as needed for  pain.  30 tablet  0  . urea (CARMOL) 40 % CREA As dircted         Past Medical History  Diagnosis Date  . Ventricular tachycardia     s/p AICD;  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 and placed on sotalol  . Hypertension   . Diabetes mellitus   . NICM (nonischemic cardiomyopathy)     normal cors by cath in 2005  . Prostate cancer     s/p prostatectomy  . Sleep apnea   . Hyperlipidemia   . Chronic systolic heart failure     ROS:   All systems reviewed and negative except as noted in the HPI.   Past Surgical History  Procedure Date  . Cardiac defibrillator placement 03/11/2004    Medtronic Maximo single lead  . Prostatectomy   . Rectal surgery   . Upper gastrointestinal endoscopy 08/2004    Barrett's esophagus  . Colonoscopy w/ biopsies and polypectomy 08/2004    adenomous polyp     Family History  Problem Relation Age of Onset  . Coronary artery disease Father   . Hypertension Mother   . Prostate cancer Father   . Coronary artery disease    . Diabetes  History   Social History  . Marital Status: Married    Spouse Name: N/A    Number of Children: 2  . Years of Education: N/A   Occupational History  . Information systems manager   Social History Main Topics  . Smoking status: Never Smoker   . Smokeless tobacco: Never Used  . Alcohol Use: No  . Drug Use: No  . Sexually Active: Not on file   Other Topics Concern  . Not on file   Social History Narrative  . No narrative on file     BP 141/87  Pulse 79  Ht 5' 11.5" (1.816 m)  Wt 228 lb (103.42 kg)  BMI 31.36 kg/m2  Physical Exam:  Well appearing NAD HEENT: Unremarkable Neck:  No JVD, no thyromegally Lymphatics:  No adenopathy Back:  No CVA tenderness Lungs:  Clear. Well healed ICD incision HEART:  Regular rate rhythm, no murmurs, no rubs, no clicks Abd:  soft, positive bowel sounds, no organomegally, no rebound, no guarding Ext:  2 plus pulses, no edema, no cyanosis, no clubbing Skin:  No rashes no  nodules Neuro:  CN II through XII intact, motor grossly intact  DEVICE  Normal device function.  See PaceArt for details.   Assess/Plan:

## 2011-01-29 LAB — CATECHOLAMINES, FRACTIONATED, URINE, 24 HOUR
Calculated Total (E+NE): 61 mcg/24 h (ref 26–121)
Norepinephrine, 24 hr Ur: 46 mcg/24 h (ref 15–100)
Total Volume - CF 24Hr U: 1600 mL

## 2011-01-30 ENCOUNTER — Ambulatory Visit (INDEPENDENT_AMBULATORY_CARE_PROVIDER_SITE_OTHER): Payer: Managed Care, Other (non HMO) | Admitting: *Deleted

## 2011-01-30 DIAGNOSIS — E538 Deficiency of other specified B group vitamins: Secondary | ICD-10-CM

## 2011-01-30 LAB — METANEPHRINES, URINE, 24 HOUR: Metanephrines, Ur: 282 mcg/24 h (ref 90–315)

## 2011-01-30 MED ORDER — CYANOCOBALAMIN 1000 MCG/ML IJ SOLN
1000.0000 ug | Freq: Once | INTRAMUSCULAR | Status: AC
  Administered 2011-01-30: 1000 ug via INTRAMUSCULAR

## 2011-02-03 NOTE — Discharge Summary (Signed)
NAMEOLLIVER, BOYADJIAN NO.:  [id number]  MEDICAL RECORD NO.:  [id number]  LOCATION:  4715                         FACILITY:  [facility]  PHYSICIAN:  Jonelle Sidle, MD DATE OF BIRTH:  [birth year]  DATE OF ADMISSION:  12/31/2010 DATE OF DISCHARGE:  01/03/2011                              DISCHARGE SUMMARY   PRIMARY CARDIOLOGIST:  Doylene Canning. Ladona Ridgel, MD  PRIMARY CARE PHYSICIAN:  Titus Dubin. Alwyn Ren, MD, FACP, [facility]  PROCEDURES PERFORMED DURING HOSPITALIZATION:  Echocardiogram completed on January 01, 2011, revealing LVEF of 25-30% with an increased pattern of mild LVH.  The patient had diffuse hypokinesis after parameters are consistent with abnormal left ventricular relaxation with grade 1 diastolic dysfunction.  FINAL DISCHARGE DIAGNOSES: 1. Ventricular tachycardia storm, now quiescent. 2. Frequent implantable cardioverter-defibrillator shock secondary to     ventricular tachycardia storm. 3. Abnormal troponin I secondary to frequent implantable cardioverter-     defibrillator shock. 4. Nonischemic cardiomyopathy, ejection fraction of 25-30% per     echocardiogram this admission. 5. Symptomatic ventricular tachycardia, status post implantable     cardioverter-defibrillator shocks. 6. Cardiac catheterization on Sep 27, 2003, with normal coronary     arteries. 7. On March 11, 2004, status post Medtronic Maximo 7232 single     chamber implantable cardioverter-defibrillator, [phone] lead in the     right ventricle. 8. Chronic systolic congestive heart failure. 9. Prostate cancer, status post prostatectomy on June 28, 2010. 10.Hypertension. 11.Hyperlipidemia. 12.Diabetes mellitus. 13.Obstructive sleep apnea, on continuous positive airway pressure.  HOSPITAL COURSE:  This is a 60 year old male patient of Dr. Lewayne Bunting with above-mentioned diagnoses who presented to the emergency room after feeling sudden lightheadedness and presyncope followed by ICD  shocks caused him to step back and fall without losing consciousness.  He was brought to the ER, the patient had recurrent syncope was followed by multiple ICD shocks.  The patient was having symptomatic runs of ventricular tachycardia with frequent ICD shocks with a total of 5 since last interrogation most recently during in route to ER.  The patient was placed in ICU and Dr. Ladona Ridgel saw him for medication management.  He was switched to short-acting Coreg at a lower dose and sotalol was added at 120 mg q.12 h.  The patient was also reduced on his dose of digoxin to 0.125 mg.  The patient was found to have elevated cardiac markers secondary to ICD shock.  He was found to be euvolemic concerning CHF.  He was continued on his ACE inhibitor.  The patient remained on telemetry and VT storm became quiescent.  The patient did have his ICD pacemaker interrogated and was found to be working appropriately.  With histogram revealing episodes of nonsustained ventricular tachycardia on December 31, 2010, January 01, 2011 with three shocks noted.  He was seen and examined by Dr. Simona Huh on day of discharge and found to be stable with no further discomfort.  He has had no chest pain, palpitations, or dyspnea.  Vital signs were found to be stable. The patient will return home with close follow up by Dr. Lewayne Bunting within the next 2 weeks.  He will continue his sotalol and digoxin  at a lower dose at 0.125 mg.  He will continue the Coreg, Altace, and aspirin along with his diabetic medications.  EKG was reviewed by Dr. Diona Browner and found a QTc of 424 milliseconds.  DISCHARGE LABS:  Sodium 141, potassium 3.8, chloride 107, CO2 30, glucose 128, BUN 9, creatinine 0.87.  Troponin was found to be positive at 3.87 trending downward to 2.08 and 0.77.  TSH was normal at 2.446. Magnesium was 2.0.  Digoxin level was 0.5.  Hemoglobin 12.3, hematocrit 38.0, white blood cells 8.8, platelets 257.  RADIOLOGY:   Chest x-ray dated December 31, 2010 revealing stable cardiomegaly with pacemaker and ICD lead, no active processes noted.  VITAL SIGNS ON DISCHARGE:  Blood pressure 132/82, pulse 110, respirations 18, temperature 97.8, O2 sat 96% on room air.  The patient weighed at 101.1 kg.  EKG on discharge; normal sinus rhythm, T-wave abnormality inferior and anterolaterally.  QTc 424 milliseconds.  Ventricular rate of 67 beats per minute.  DISCHARGE MEDICATIONS: 1. Digoxin 0.125 mg one tablet by mouth daily (new lower dose). 2. Sotalol 120 mg one tablet by mouth b.i.d. (prescription provided). 3. Aspirin 325 mg two tablets by mouth daily as needed. 4. Coreg CR 80 mg one tablet by mouth daily. 5. Janumet 09/998 mg one tablet by mouth b.i.d. 6. Lipitor 20 mg by mouth daily. 7. Ramipril 10 mg two tablets by mouth b.i.d.  ALLERGIES:  No known drug allergies.  FOLLOWUP PLANS AND APPOINTMENTS: 1. The patient will have a followup appointment within the next 1-2     weeks per Dr. Ladona Ridgel at the [facility] office.  Our office will call     him to make that appointment. 2. He will return to see Dr. Alwyn Ren for continued medical management. 3. The patient has been given new prescriptions and medication list     prior to discharge. 4. The patient has been advised to bring all medications to followup     appointments.  Time spent with the patient to include physician time 35 minutes.     Bettey Mare. Lyman Bishop, NP   ______________________________ Jonelle Sidle, MD    KML/MEDQ  D:  01/03/2011  T:  01/03/2011  Job:  [id number]  cc:   Titus Dubin. Alwyn Ren, MD,FACP,FCCP  Electronically Signed by Joni Reining NP on 01/27/2011 04:43:52 PM Electronically Signed by Nona Dell MD on 02/03/2011 08:37:56 AM

## 2011-02-04 ENCOUNTER — Telehealth: Payer: Self-pay | Admitting: Internal Medicine

## 2011-02-04 NOTE — Telephone Encounter (Signed)
Feels like he is having PVC's  It is happening quite a bit. He states his HR is 70-80  He is going to send transmission tonight I will call him back tomorrow  Patient aware

## 2011-02-04 NOTE — Telephone Encounter (Signed)
Pt wants to talk to you about his heart beat and it not feeling right

## 2011-02-05 ENCOUNTER — Ambulatory Visit (INDEPENDENT_AMBULATORY_CARE_PROVIDER_SITE_OTHER): Payer: Managed Care, Other (non HMO) | Admitting: *Deleted

## 2011-02-05 ENCOUNTER — Telehealth: Payer: Self-pay | Admitting: *Deleted

## 2011-02-05 DIAGNOSIS — E538 Deficiency of other specified B group vitamins: Secondary | ICD-10-CM

## 2011-02-05 DIAGNOSIS — M549 Dorsalgia, unspecified: Secondary | ICD-10-CM

## 2011-02-05 MED ORDER — CYANOCOBALAMIN 1000 MCG/ML IJ SOLN
1000.0000 ug | Freq: Once | INTRAMUSCULAR | Status: AC
  Administered 2011-02-05: 1000 ug via INTRAMUSCULAR

## 2011-02-05 NOTE — Telephone Encounter (Signed)
Discussed with Dr Johney Frame and he suggested the patient increase his dose of Sotalol back up to one tablet twice daily  Patient aware and knows if things get where he feels like he is going to pass out then he will need to go to the ER

## 2011-02-05 NOTE — Telephone Encounter (Signed)
Pt calling asking to speak w/ [name] regarding pt transmission that was sent. Pt said PVC's are getting worse and would like a call back ASAP. Please return pt call.

## 2011-02-05 NOTE — Telephone Encounter (Signed)
Tried to call Pt VM not accepting messages will try again to contact Pt.

## 2011-02-05 NOTE — Telephone Encounter (Signed)
16,109 single PVC's  And 25 runs none sus or non sustained

## 2011-02-05 NOTE — Telephone Encounter (Signed)
Pt states that the tramadol Rx is not helping to relieve pain. Pt notes that he is still unable to sleep well due to increase pain in back. Pt indicated that he was Rx some hydrocodone but it only made him sleep and did not resolve the pain either. Pt also still c/o anxiety attack as well. Pt is due to return the work on the 02-16-11 and would like to know what can be done to resolve issue with back specialist, another med, therapy ???.[name] Marland KitchenPlease advise

## 2011-02-05 NOTE — Telephone Encounter (Signed)
Tried again still unable to leave VM will try again later

## 2011-02-05 NOTE — Telephone Encounter (Signed)
I recommend referral to Dr.Ramos,a  nonsurgical back specialist unless you have a preferred physician

## 2011-02-06 NOTE — Telephone Encounter (Signed)
Tried to call Pt again VM still full unable to leave VM.

## 2011-02-09 ENCOUNTER — Encounter: Payer: Self-pay | Admitting: *Deleted

## 2011-02-09 NOTE — Telephone Encounter (Signed)
Tried to call Pt again VM still full letter mailed to Pt to contact office.

## 2011-02-12 ENCOUNTER — Other Ambulatory Visit: Payer: Self-pay | Admitting: Internal Medicine

## 2011-02-12 NOTE — Telephone Encounter (Signed)
Addended by: Candie Echevaria L on: 02/12/2011 12:05 PM   Modules accepted: Orders

## 2011-02-12 NOTE — Telephone Encounter (Signed)
Pt return call and is aware the referral is put in. Per Dr hopper we need to find out why Pt is out of work back pain, or anxiety issue. Disability paperwork received. Tried to call pt back to inform him of dr hopper other concern VM full unable to leave VM will try again later.

## 2011-02-13 ENCOUNTER — Telehealth: Payer: Self-pay | Admitting: Internal Medicine

## 2011-02-13 NOTE — Telephone Encounter (Signed)
Pt calling regarding short term disability papers. They were faxed over on Monday and they have yet to hear back from [place]. Please return pt call to discuss further.

## 2011-02-13 NOTE — Telephone Encounter (Signed)
I spoke with the pt and made him aware that the paperwork that was sent to Dr Ladona Ridgel on 02/09/11 was not received.  [employer] is going to refax the pt's disability paperwork to our office.  The pt also had disability paperwork sent to Dr Alwyn Ren.  This message was initially sent to Dr Alwyn Ren but then got pulled when the pt called Cardiology.  I will forward this message back to Dr Alwyn Ren to address his part of the pt's disability paperwork.

## 2011-02-13 NOTE — Telephone Encounter (Signed)
Calling back to check on status of disability claim.

## 2011-02-13 NOTE — Telephone Encounter (Signed)
Following up on Disability form for [name] claim # [id number]. Wanting to know the status of Disability form has it been completed .

## 2011-02-13 NOTE — Telephone Encounter (Signed)
[employer]. I spoke with [employer] and they do not have any disability paperwork logged on this patient.  I spoke with [employer] and they said that the paperwork was faxed on 02/09/11 to our office.  I requested that she please refax this information to the Attn of [employer] and Dr Ladona Ridgel.

## 2011-02-17 ENCOUNTER — Telehealth: Payer: Self-pay | Admitting: Internal Medicine

## 2011-02-17 NOTE — Telephone Encounter (Signed)
Dawn from [employer] disability claim calling in reference to pt. Please return call to inform disability paper work was received and if so please fax back.   Reference Claim #  [id number]

## 2011-02-18 NOTE — Telephone Encounter (Signed)
Called and let them know that we have paper work and are waiting for Dr Ladona Ridgel to fill out later this week

## 2011-02-24 ENCOUNTER — Encounter: Payer: Self-pay | Admitting: Internal Medicine

## 2011-02-24 ENCOUNTER — Ambulatory Visit (AMBULATORY_SURGERY_CENTER): Payer: Managed Care, Other (non HMO) | Admitting: Internal Medicine

## 2011-02-24 VITALS — BP 140/71 | HR 68 | Temp 98.3°F | Resp 23 | Ht 71.0 in | Wt 228.0 lb

## 2011-02-24 DIAGNOSIS — Z86010 Personal history of colonic polyps: Secondary | ICD-10-CM

## 2011-02-24 DIAGNOSIS — K2270 Barrett's esophagus without dysplasia: Secondary | ICD-10-CM

## 2011-02-24 DIAGNOSIS — Z1211 Encounter for screening for malignant neoplasm of colon: Secondary | ICD-10-CM

## 2011-02-24 DIAGNOSIS — D126 Benign neoplasm of colon, unspecified: Secondary | ICD-10-CM

## 2011-02-24 LAB — GLUCOSE, CAPILLARY
Glucose-Capillary: 108 mg/dL — ABNORMAL HIGH (ref 70–99)
Glucose-Capillary: 105 mg/dL — ABNORMAL HIGH (ref 70–99)

## 2011-02-24 MED ORDER — SODIUM CHLORIDE 0.9 % IV SOLN: 500.0000 mL | INTRAVENOUS | Status: DC

## 2011-02-24 NOTE — Progress Notes (Signed)
Unsuccessful IV attempts to Right hand X 2 by A. Beckey Downing, RN

## 2011-02-24 NOTE — Patient Instructions (Addendum)
The Barrett's esophagus looked unchanged. I will send a letter about the results and plans after pathology reviewed. One small colon polyp was removed but got destroyed when trying to retrieve it. It was tiny and benign in appearance. A routine repeat colonoscopy in 7 years makes sense. Iva Boop, MD, [facility]  Please refer to your blue and neon green sheets for instructions regarding diet and activity for the rest of today.  You may resume your medications as you would normally take them.

## 2011-02-25 ENCOUNTER — Telehealth: Payer: Self-pay | Admitting: *Deleted

## 2011-02-25 NOTE — Telephone Encounter (Signed)

## 2011-02-26 ENCOUNTER — Telehealth: Payer: Self-pay | Admitting: Internal Medicine

## 2011-02-26 NOTE — Telephone Encounter (Signed)
Pt calling re question on disability papers

## 2011-02-26 NOTE — Telephone Encounter (Signed)
[name] is questioning the status of his disability papers.  [employer] does not have them.

## 2011-02-28 ENCOUNTER — Other Ambulatory Visit: Payer: Self-pay | Admitting: Internal Medicine

## 2011-03-02 NOTE — Telephone Encounter (Signed)
Pt calling to speak with Tresa Endo. Pt said his short term disability was denied b/c we did not complete paperwork and send it back to insurance company. Please return pt call to discuss further.

## 2011-03-02 NOTE — Progress Notes (Signed)
Quick Note:  Barrett's - no dysplasia Recall EGD 3 years-5 years will consider in 3 ______

## 2011-03-03 NOTE — Telephone Encounter (Signed)
Patient aware forms completed and faxed  Will mail him a copy also  I have let him know that he can call me if there is a problem getting this approved

## 2011-03-03 NOTE — Telephone Encounter (Signed)
Pt calling to speak with someone regarding pt short term disability paperwork. Please return pt call back ASAP. Pt called yesterday and didn't receive a call back from our office.

## 2011-03-04 NOTE — Telephone Encounter (Addendum)
Copy Scanned in EPIC, Original's Mailed to Pt ( [employer] Forms) 03/04/11/km

## 2011-03-06 ENCOUNTER — Encounter: Payer: Self-pay | Admitting: Internal Medicine

## 2011-03-06 ENCOUNTER — Ambulatory Visit (INDEPENDENT_AMBULATORY_CARE_PROVIDER_SITE_OTHER): Payer: Managed Care, Other (non HMO) | Admitting: Internal Medicine

## 2011-03-06 ENCOUNTER — Ambulatory Visit: Payer: Managed Care, Other (non HMO)

## 2011-03-06 DIAGNOSIS — E119 Type 2 diabetes mellitus without complications: Secondary | ICD-10-CM

## 2011-03-06 DIAGNOSIS — E538 Deficiency of other specified B group vitamins: Secondary | ICD-10-CM

## 2011-03-06 HISTORY — DX: Deficiency of other specified B group vitamins: E53.8

## 2011-03-06 MED ORDER — CYANOCOBALAMIN 1000 MCG/ML IJ SOLN
1000.0000 ug | Freq: Once | INTRAMUSCULAR | Status: AC
  Administered 2011-03-06: 1000 ug via INTRAMUSCULAR

## 2011-03-06 NOTE — Progress Notes (Signed)
  Subjective:    Patient ID: [name], male    DOB: Nov 09, 1950, 60 y.o.   MRN: [id number]  HPI Dizziness Onset:since meds started after defibrillator went off Context: Position change:no Benign positional vertigo symptoms:no Straining:no Pain:no Cardiac prodrome: no palpitations, irregular rhythm, heart rate change. He has noted some change in his rhythm after he has a dizziness but not prior. Neurologic prodrome:no  headache, numbness and tingling, weakness, change in coordination (gait/falling). He has noted some tingling in his arms after the dizziness starts but not prior. Syncope:no Seizure activity:no Upper respiratory tract infection/extrinsic symptoms:no Duration:hours as lightheadedness, especially @ computer Frequency:daily Associated signs and symptoms: Visual change (blurred/double/loss):blurring Hearing loss/tinnitus:intermittent tinnitus Nausea/sweating:no Chest pain: He describes intermittent momentary chest pain when he experiences 3 beats in a row. This phenomenon  has been recurrent Dyspnea:no Treatment/response: ice on neck  He was having the same type of dizziness 1 month ago; his Betapace was decreased but the dizziness did not change. With the decreased dose he apparently had significant dysrhythmias.       Review of Systems   History  TSH was 2.446 in August.  Sugars have been labile; his  glucose was 236 this morning, one hour after eating. His glucose log was reviewed. Fasting glucoses have ranged from 114-194.  His last A1c was 7.2%  He has been taking B12 shots  weekly x4; he has not started his monthly injection schedule.     Objective:   Physical Exam  Gen. appearance: Well-nourished, in no distress Eyes: Extraocular motion intact, field of vision normal, vision grossly intact with lenses, no nystagmus. There is thinning of the eyebrows laterally ENT: Canals clear, tympanic membranes normal, tuning fork exam normal, hearing grossly  normal Neck: Normal range of motion, no masses, normal thyroid Cardiovascular: S4 is present; intermittently he will have a pause w/o murmurs, gallops or extra heart sounds Muscle skeletal: tone, &  strength normal Neuro:no cranial nerve deficit, deep tendon  reflexes normal, gait normal. Finger  to nose testing is normal. Romberg is slightly unsteady but he does not fall. Lymph: No cervical or axillary LA Skin: Warm and dry without suspicious lesions or rashes Psych: no anxiety or mood change. Normally interactive and cooperative.        Assessment & Plan:  #1 dizziness; by history this started after medications were initiated following his defibrillator firing  #2 B12 deficiency; status post 4 weekly injections  #3 diabetes with a wide range of fasting glucoses.  Plan: I'll ask Dr. Ladona Ridgel to review the note concerning dysrhythmias as possible cause of  symptoms. A1 C. and B12 levels will be monitored.

## 2011-03-06 NOTE — Patient Instructions (Addendum)
Eat a low-fat diet with lots of fruits and vegetables, up to 7-9 servings per day. Avoid obesity; your goal is waist measurement < 40 inches.Consume less than 40 grams of sugar per day from foods & drinks with High Fructose Corn Sugar as #1,2,3 or # 4 on label. Follow the low carb nutrition program in [employer] as closely as possible to prevent Diabetes progression & complications. White carbohydrates (potatoes, rice, bread, and pasta) have a high spike of sugar and a high load of sugar. For example a  baked potato has a cup of sugar and a  french fry  2 teaspoons of sugar. Yams, wild  rice, whole grained bread &  wheat pasta have been much lower spike and load of  sugar. Portions should be the size of a deck of cards or your palm.  Goals for home glucose monitoring are : fasting  or morning glucose goal of  90-150. Check this M, W , F & Sun. Two hours after any meal , goal = < 180, preferably < 150. Check this 2 hrs after b'fast on Tues; after lunch on Thurs & after eve meal on Sat

## 2011-03-10 NOTE — Telephone Encounter (Signed)
Called and spoke with patient.

## 2011-03-10 NOTE — Telephone Encounter (Signed)
Called and spoke with patient  Let him know that I have re-done the papers and will send him the updated copy

## 2011-03-10 NOTE — Telephone Encounter (Signed)
Pt calling wanting to speak with nurse regarding pt disability paperwork. Pt thinks there might be some errors in it. Please return pt call to discuss further.

## 2011-03-11 NOTE — Telephone Encounter (Addendum)
[employer] Attending Physicians Statement # 2 had to be updated, Tresa Endo completed, I refaxed to [facility] @ [phone] , also Mailed Pt Updated Copy 03/11/11/km

## 2011-03-13 ENCOUNTER — Ambulatory Visit (INDEPENDENT_AMBULATORY_CARE_PROVIDER_SITE_OTHER): Payer: Managed Care, Other (non HMO) | Admitting: Internal Medicine

## 2011-03-13 ENCOUNTER — Encounter: Payer: Self-pay | Admitting: Internal Medicine

## 2011-03-13 DIAGNOSIS — F419 Anxiety disorder, unspecified: Secondary | ICD-10-CM

## 2011-03-13 DIAGNOSIS — F411 Generalized anxiety disorder: Secondary | ICD-10-CM

## 2011-03-13 DIAGNOSIS — I10 Essential (primary) hypertension: Secondary | ICD-10-CM

## 2011-03-13 DIAGNOSIS — E119 Type 2 diabetes mellitus without complications: Secondary | ICD-10-CM

## 2011-03-13 MED ORDER — FLUOXETINE HCL 20 MG PO CAPS: 20.0000 mg | ORAL_CAPSULE | Freq: Every day | ORAL | Status: DC

## 2011-03-13 NOTE — Patient Instructions (Signed)
Please assess your symptom response to this agent to raise the serotonin neurotransmitter.

## 2011-03-13 NOTE — Progress Notes (Signed)
  Subjective:    Patient ID: [name], male    DOB: 05/25/1950, 60 y.o.   MRN: [id number]  HPI He had been scheduled for an annual exam today; but  he's been the hospital and this would be duplicative. Since his visit 10/26 his dizziness has significantly improved in the last 48 hours. He is scheduled to see the electrophysiologist November 6.  This improvement is in the context of improved blood pressure and diabetes control. His most recent blood pressures have ranged from 109/72 to a high of 136/84. Fasting glucoses have ranged from 107-135. 2 hours after a meal the highest glucose has been  125.  He questions whether his symptoms could be related to panic attacks. The pathophysiology of neurotransmitter deficiency causing panic attacks was discussed.    Review of Systems     Objective:   Physical Exam  He appears healthy and well-nourished. He is very open and honest. He understands the pathophysiology of neurotransmitter deficiency and wishes to try an agent to increase serotonin.        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  Plan: See orders and recommendations.

## 2011-03-17 ENCOUNTER — Ambulatory Visit (INDEPENDENT_AMBULATORY_CARE_PROVIDER_SITE_OTHER): Payer: Managed Care, Other (non HMO) | Admitting: Physician Assistant

## 2011-03-17 ENCOUNTER — Ambulatory Visit: Payer: Managed Care, Other (non HMO) | Admitting: Physician Assistant

## 2011-03-17 ENCOUNTER — Encounter: Payer: Self-pay | Admitting: Physician Assistant

## 2011-03-17 DIAGNOSIS — I472 Ventricular tachycardia, unspecified: Secondary | ICD-10-CM

## 2011-03-17 DIAGNOSIS — I10 Essential (primary) hypertension: Secondary | ICD-10-CM

## 2011-03-17 DIAGNOSIS — R42 Dizziness and giddiness: Secondary | ICD-10-CM

## 2011-03-17 NOTE — Progress Notes (Signed)
History of Present Illness: Primary Electrophysiologist:  Dr. Lewayne Bunting   [name] is a 60 y.o. male presents for evaluation of dizziness.  He has a h/o NICM, EF 25-30%, chronic systolic CHF, VTAch, s/p AICD, DM2, HTN, HLP, sleep apnea.  LHC in 5/05: EF 30%, pCFX 10-15%.  Admitted in 8/12 with VT storm and ICD shocks.  Echo 8/12: mild LVH, EF 25-30%, trivial MR, grade 1 diast dysfxn, mild LAE, PASP 34.  Sotalol was started then.  He was last seen by Dr. Ladona Ridgel in 9/12.  Sotalol was decreased to 120 mg 1/2 tab in the AM and 1 tab in the PM due to dizziness.    He actually feels the best today he has in since he went to the hospital in 8/12.  He has occasional atypical chest pain.  Also note palpitations.  Seems to be describing PAC vs PVCs.  Notes increased palpitations since decreasing Sotalol.  He increased it back to 120 mg bid on his own and the palpitations have lessened.  He did have one day where he felt like his HR was 120 for some time and then it stopped.  He denies ICD shocks.  No syncope.  No dyspnea.  No orthopnea, PND, edema.  He was given Prozac by Dr. Alwyn Ren but decided not to use it.  Checks his BP at home and it typically runs 140s or higher.  BP here today is optimal.    Past Medical History  Diagnosis Date  . Ventricular tachycardia     s/p AICD;  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 and placed on sotalol  . Hypertension   . Diabetes mellitus   . NICM (nonischemic cardiomyopathy)     normal cors by cath in 2005  . Prostate cancer     s/p prostatectomy  . Sleep apnea   . Hyperlipidemia   . Chronic systolic heart failure   . Anxiety     since defibulator placement  . Arthritis     knees  . Cataract   . GERD (gastroesophageal reflux disease)     Current Outpatient Prescriptions  Medication Sig Dispense Refill  . atorvastatin (LIPITOR) 20 MG tablet Take 20 mg by mouth daily.        . carvedilol (COREG) 12.5 MG tablet TAKE 1 & 1/2 TABLETS TWICE A DAY WITH MEALS  90 tablet  0  .  digoxin (LANOXIN) 0.125 MG tablet Take 125 mcg by mouth daily.        [name] glucose blood test strip Use as instructed  100 each  11  . JANUMET 50-1000 MG per tablet TAKE 1 TABLET BY MOUTH 2 TIMES DAILY WITH A MEAL.  60 tablet  3  . Lancets (ONETOUCH ULTRASOFT) lancets PRN  100 each  11  . LORazepam (ATIVAN) 0.5 MG tablet Take 1 tablet (0.5 mg total) by mouth as directed. One every 8 hours as needed for acute rise in blood pressure  30 tablet  0  . ramipril (ALTACE) 10 MG capsule Take 1 capsule (10 mg total) by mouth 2 (two) times daily.  60 capsule  5  . sotalol (BETAPACE) 120 MG tablet Take 120 mg by mouth 2 (two) times daily.        . urea (CARMOL) 40 % CREA As dircted      . FLUoxetine (PROZAC) 20 MG capsule Take 1 capsule (20 mg total) by mouth daily.  30 capsule  2  . HYDROcodone-acetaminophen (NORCO) 5-325 MG per tablet As needed      .  traMADol (ULTRAM) 50 MG tablet Take 1 tablet (50 mg total) by mouth every 6 (six) hours as needed for pain.  30 tablet  0    Allergies: No Known Allergies   History  Substance Use Topics  . Smoking status: Never Smoker   . Smokeless tobacco: Never Used  . Alcohol Use: No     Vital Signs: BP 120/78  Pulse 91  Resp 18  Ht 5\' 11"  (1.803 m)  Wt 228 lb 12.8 oz (103.783 kg)  BMI 31.91 kg/m2   Filed Vitals:   03/17/11 1507 03/17/11 1508 03/17/11 1511 03/17/11 1514  BP: 116/77 103/70 120/78 110/73  Pulse: 88 90 91 91  Resp:      Height:      Weight:        PHYSICAL EXAM: Well nourished, well developed, in no acute distress HEENT: normal Neck: no JVD Cardiac:  normal S1, S2; RRR; no murmur Lungs:  clear to auscultation bilaterally, no wheezing, rhonchi or rales Abd: soft, nontender, no hepatomegaly Ext: no edema Skin: warm and dry Neuro:  CNs 2-12 intact, no focal abnormalities noted  EKG:  NSR, HR 83, normal axis, LVH with repol abnormality, QTc 444 ms, no significant changes  ASSESSMENT AND PLAN:

## 2011-03-17 NOTE — Patient Instructions (Signed)
Your physician recommends that you schedule a follow-up appointment in: 05/06/11 @ 2:15 WITH DR. Ladona Ridgel  YOU MAY SCHEDULE A NURSE VISIT IF YOU WOULD LIKE TO HAVE YOUR BLOOD PRESSURE CHECKED WITH YOUR BP MACHINE AGAINST OUR READINGS.

## 2011-03-17 NOTE — Assessment & Plan Note (Signed)
Question if dizziness may be related to hypotension.  I have suggested he get his cuff at home checked.  If it is accurate and he remains dizzy with BPs remaining in 100-110 range he can call us.  At that point, I would decrease Altace to 10 mg QD.  Otherwise no changes.

## 2011-03-17 NOTE — Assessment & Plan Note (Addendum)
Medication side effect vs anxiety driven.  I suspect the latter more than anything.  He feels better today and definitely feels better on the Sotalol 120 mg BID.  No medication changes today.  Follow up with Dr. Ladona Ridgel as scheduled.  I encouraged him to try to start the Prozac but he is not interested.

## 2011-03-17 NOTE — Assessment & Plan Note (Addendum)
He is worried he had another episode of VT.  ICD interrogated today and no recent sustained episodes of VT.

## 2011-03-18 ENCOUNTER — Other Ambulatory Visit: Payer: Self-pay | Admitting: Internal Medicine

## 2011-03-18 DIAGNOSIS — K2270 Barrett's esophagus without dysplasia: Secondary | ICD-10-CM

## 2011-03-18 MED ORDER — OMEPRAZOLE 20 MG PO CPDR: 20.0000 mg | DELAYED_RELEASE_CAPSULE | Freq: Every day | ORAL | Status: DC

## 2011-03-18 NOTE — Assessment & Plan Note (Signed)
restart PPI

## 2011-03-30 ENCOUNTER — Telehealth: Payer: Self-pay | Admitting: Internal Medicine

## 2011-03-30 NOTE — Telephone Encounter (Signed)
lmom for patient that forms were faxed on 03/11/11 AND will refax forms again today

## 2011-03-30 NOTE — Telephone Encounter (Signed)
Pt needs you to call him regarding disability paperwork the company didn't get information

## 2011-03-30 NOTE — Telephone Encounter (Signed)
[employer] " Attending Physician's Statement forms were Refaxed to [facility] @ [phone]  03/30/11/km

## 2011-03-31 ENCOUNTER — Telehealth: Payer: Self-pay | Admitting: Internal Medicine

## 2011-03-31 ENCOUNTER — Ambulatory Visit: Payer: Managed Care, Other (non HMO)

## 2011-03-31 NOTE — Telephone Encounter (Signed)
Spoke with Angie at [employer] [phone] and they have everything they need from [place].  I called the patient and let him know

## 2011-03-31 NOTE — Telephone Encounter (Signed)
Pt wanted to know if you got info from [place] regarding his disability because he needs the info about rtning to work

## 2011-04-19 ENCOUNTER — Other Ambulatory Visit: Payer: Self-pay | Admitting: Internal Medicine

## 2011-04-27 ENCOUNTER — Other Ambulatory Visit: Payer: Self-pay | Admitting: Internal Medicine

## 2011-04-28 ENCOUNTER — Other Ambulatory Visit: Payer: Self-pay

## 2011-04-28 ENCOUNTER — Other Ambulatory Visit: Payer: Self-pay | Admitting: Internal Medicine

## 2011-04-28 MED ORDER — ATORVASTATIN CALCIUM 20 MG PO TABS: 20.0000 mg | ORAL_TABLET | Freq: Every day | ORAL | Status: DC

## 2011-04-28 NOTE — Telephone Encounter (Signed)
Spoke with patient, informed patient he is due for lab work Lipid/Hep 272.4/995.20, patient also mentioned that he would like to schedule a B12 injection. Patient aware med (Lipitor) will be filled for 30 days to allow time for him to have labs.  Patient stated he will have to check his schedule and call back to schedule labs

## 2011-04-28 NOTE — Telephone Encounter (Signed)
See refill dated 04/28/11

## 2011-05-06 ENCOUNTER — Ambulatory Visit (INDEPENDENT_AMBULATORY_CARE_PROVIDER_SITE_OTHER): Payer: Managed Care, Other (non HMO) | Admitting: Internal Medicine

## 2011-05-06 ENCOUNTER — Encounter: Payer: Self-pay | Admitting: Internal Medicine

## 2011-05-06 DIAGNOSIS — I5022 Chronic systolic (congestive) heart failure: Secondary | ICD-10-CM

## 2011-05-06 DIAGNOSIS — Z95810 Presence of automatic (implantable) cardiac defibrillator: Secondary | ICD-10-CM

## 2011-05-06 DIAGNOSIS — I472 Ventricular tachycardia: Secondary | ICD-10-CM

## 2011-05-06 LAB — ICD DEVICE OBSERVATION
CHARGE TIME: 10.57 s
DEV-0020ICD: NEGATIVE
RV LEAD AMPLITUDE: 5.6 mv
RV LEAD IMPEDENCE ICD: 376 Ohm
RV LEAD THRESHOLD: 1 V
TZAT-0001FASTVT: 1
TZAT-0004FASTVT: 8
TZAT-0004SLOWVT: 6
TZAT-0004SLOWVT: 6
TZAT-0005FASTVT: 88 pct
TZAT-0005SLOWVT: 84 pct
TZAT-0011FASTVT: 10 ms
TZAT-0011SLOWVT: 10 ms
TZAT-0011SLOWVT: 10 ms
TZAT-0012SLOWVT: 200 ms
TZAT-0012SLOWVT: 200 ms
TZAT-0013FASTVT: 1
TZAT-0020SLOWVT: 1.6 ms
TZON-0003SLOWVT: 370 ms
TZON-0010AFLUTTER: 30 ms
TZON-0010VSLOWVT: 30 ms
TZON-0011AFLUTTER: 70
TZST-0001FASTVT: 2
TZST-0001FASTVT: 5
TZST-0001SLOWVT: 4
TZST-0001SLOWVT: 5
TZST-0003FASTVT: 35 J
TZST-0003FASTVT: 35 J
TZST-0003FASTVT: 35 J
TZST-0003SLOWVT: 20 J
TZST-0003SLOWVT: 35 J
TZST-0003SLOWVT: 35 J

## 2011-05-06 NOTE — Progress Notes (Signed)
HPI [name] returns today for followup. He is a very pleasant 60 year old man with a history of ventricular tachycardia, nonischemic cardiomyopathy, chronic systolic heart failure, hypertension, status post ICD implantation. The patient continues to do well. He notes that he has lost 30 pounds. He denies chest pain, shortness of breath, peripheral edema, or any recent ICD shocks. He is somewhat anxious about exerting himself too much. No Known Allergies   Current Outpatient Prescriptions  Medication Sig Dispense Refill  . atorvastatin (LIPITOR) 20 MG tablet Take 1 tablet (20 mg total) by mouth daily.  30 tablet  0  . carvedilol (COREG) 12.5 MG tablet TAKE 1 & 1/2 TABLETS TWICE A DAY WITH MEALS  90 tablet  2  . digoxin (LANOXIN) 0.125 MG tablet Take 125 mcg by mouth daily.        [name] glucose blood test strip Use as instructed  100 each  11  . JANUMET 50-1000 MG per tablet TAKE 1 TABLET BY MOUTH 2 TIMES DAILY WITH A MEAL.  60 tablet  3  . Lancets (ONETOUCH ULTRASOFT) lancets PRN  100 each  11  . omeprazole (PRILOSEC) 20 MG capsule Take 1 capsule (20 mg total) by mouth daily. 30 minutes before breakfast  30 capsule  11  . ramipril (ALTACE) 10 MG capsule Take 1 capsule (10 mg total) by mouth 2 (two) times daily.  60 capsule  5  . sotalol (BETAPACE) 120 MG tablet Take 120 mg by mouth 2 (two) times daily.       . urea (CARMOL) 40 % CREA As dircted         Past Medical History  Diagnosis Date  . Ventricular tachycardia     s/p AICD;  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 and placed on sotalol  . Hypertension   . Diabetes mellitus   . NICM (nonischemic cardiomyopathy)     normal cors by cath in 2005  . Prostate cancer     s/p prostatectomy  . Sleep apnea   . Hyperlipidemia   . Chronic systolic heart failure   . Anxiety     since defibulator placement  . Arthritis     knees  . Cataract   . GERD (gastroesophageal reflux disease)     ROS:   All systems reviewed and negative except as noted in the  HPI.   Past Surgical History  Procedure Date  . Cardiac defibrillator placement 03/11/2004    Medtronic Maximo single lead  . Prostatectomy   . Rectal surgery   . Upper gastrointestinal endoscopy 08/2004, 02/24/11    Barrett's esophagus  . Colonoscopy w/ biopsies and polypectomy 08/2004, 02/24/11    6mm adenoma in 2006,  5 mm polyp (not recovered) 2012     Family History  Problem Relation Age of Onset  . Coronary artery disease Father   . Hypertension Mother   . Prostate cancer Father   . Coronary artery disease    . Diabetes       History   Social History  . Marital Status: Married    Spouse Name: N/A    Number of Children: 2  . Years of Education: N/A   Occupational History  . Information systems manager   Social History Main Topics  . Smoking status: Never Smoker   . Smokeless tobacco: Never Used  . Alcohol Use: No  . Drug Use: No  . Sexually Active: Not on file   Other Topics Concern  . Not on file   Social History  Narrative  . No narrative on file     BP 152/93  Pulse 75  Ht 5' 11.5" (1.816 m)  Wt 103.874 kg (229 lb)  BMI 31.49 kg/m2  Physical Exam:  Well appearing middle-age man, NAD HEENT: Unremarkable Neck:  No JVD, no thyromegally Lymphatics:  No adenopathy Back:  No CVA tenderness Lungs:  Clear with no wheezes, rales, or rhonchi. HEART:  Regular rate rhythm, no murmurs, no rubs, no clicks Abd:  soft, positive bowel sounds, no organomegally, no rebound, no guarding Ext:  2 plus pulses, no edema, no cyanosis, no clubbing Skin:  No rashes no nodules Neuro:  CN II through XII intact, motor grossly intact  DEVICE  Normal device function.  See PaceArt for details.   Assess/Plan:

## 2011-05-06 NOTE — Patient Instructions (Signed)
Remote monitoring is used to monitor your Pacemaker of ICD from home. This monitoring reduces the number of office visits required to check your device to one time per year. It allows [place] to keep an eye on the functioning of your device to ensure it is working properly. You are scheduled for a device check from home on August 06, 2011. You may send your transmission at any time that day. If you have a wireless device, the transmission will be sent automatically. After your physician reviews your transmission, you will receive a postcard with your next transmission date.  Your physician wants you to follow-up in: 1 year with Dr Ladona Ridgel. You will receive a reminder letter in the mail two months in advance. If you don't receive a letter, please call our office to schedule the follow-up appointment.

## 2011-05-06 NOTE — Assessment & Plan Note (Signed)
His tachycardia is well controlled. He will continue his sotalol.

## 2011-05-06 NOTE — Assessment & Plan Note (Signed)
His CHF is well controlled. He will continue his current meds and I have asked him to maintain a low sodium diet.

## 2011-05-06 NOTE — Assessment & Plan Note (Signed)
His device is working normally. He will return in several months.

## 2011-05-19 ENCOUNTER — Telehealth: Payer: Self-pay | Admitting: Internal Medicine

## 2011-05-19 NOTE — Telephone Encounter (Signed)
Dr.Hopper please advise if patient should have Lipid, NMR or [facility]

## 2011-05-19 NOTE — Telephone Encounter (Signed)
Patient made an appt on 05/25/11 to have his cholesterol checked. Please order.

## 2011-05-19 NOTE — Telephone Encounter (Signed)
Please  schedule fasting Labs : Lipids, A1c. PLEASE BRING THESE INSTRUCTIONS TO FOLLOW UP  LAB APPOINTMENT.This will guarantee correct labs are drawn, eliminating need for repeat blood sampling ( needle sticks ! ). Diagnoses [name]: [phone]

## 2011-05-21 NOTE — Telephone Encounter (Signed)
Please schedule.  Thank You 

## 2011-05-25 ENCOUNTER — Ambulatory Visit (INDEPENDENT_AMBULATORY_CARE_PROVIDER_SITE_OTHER): Payer: Managed Care, Other (non HMO) | Admitting: *Deleted

## 2011-05-25 ENCOUNTER — Other Ambulatory Visit (INDEPENDENT_AMBULATORY_CARE_PROVIDER_SITE_OTHER): Payer: Managed Care, Other (non HMO)

## 2011-05-25 DIAGNOSIS — E785 Hyperlipidemia, unspecified: Secondary | ICD-10-CM

## 2011-05-25 DIAGNOSIS — E538 Deficiency of other specified B group vitamins: Secondary | ICD-10-CM

## 2011-05-25 DIAGNOSIS — R7309 Other abnormal glucose: Secondary | ICD-10-CM

## 2011-05-25 LAB — HEMOGLOBIN A1C: Hgb A1c MFr Bld: 6.2 % (ref 4.6–6.5)

## 2011-05-25 LAB — LIPID PANEL: Total CHOL/HDL Ratio: 3

## 2011-05-25 MED ORDER — CYANOCOBALAMIN 1000 MCG/ML IJ SOLN
1000.0000 ug | Freq: Once | INTRAMUSCULAR | Status: AC
  Administered 2011-05-25: 1000 ug via INTRAMUSCULAR

## 2011-05-26 ENCOUNTER — Other Ambulatory Visit: Payer: Self-pay | Admitting: Internal Medicine

## 2011-06-04 ENCOUNTER — Other Ambulatory Visit: Payer: Self-pay | Admitting: Internal Medicine

## 2011-07-15 ENCOUNTER — Telehealth: Payer: Self-pay | Admitting: Internal Medicine

## 2011-07-15 NOTE — Telephone Encounter (Signed)
Patient has questions about recent lab work. Patient is wondering if he should decrease his blod sugar meds.

## 2011-07-15 NOTE — Telephone Encounter (Signed)
Per labs Pt to DECREASE Janumet to ONCE daily with largest meal, Discuss with patient, ok and verbalized understanding of med changes.

## 2011-07-16 ENCOUNTER — Ambulatory Visit (INDEPENDENT_AMBULATORY_CARE_PROVIDER_SITE_OTHER): Payer: Managed Care, Other (non HMO)

## 2011-07-16 ENCOUNTER — Ambulatory Visit: Payer: Managed Care, Other (non HMO)

## 2011-07-16 DIAGNOSIS — D518 Other vitamin B12 deficiency anemias: Secondary | ICD-10-CM

## 2011-07-16 DIAGNOSIS — D519 Vitamin B12 deficiency anemia, unspecified: Secondary | ICD-10-CM

## 2011-07-16 MED ORDER — CYANOCOBALAMIN 1000 MCG/ML IJ SOLN
1000.0000 ug | Freq: Once | INTRAMUSCULAR | Status: AC
  Administered 2011-07-16: 1000 ug via INTRAMUSCULAR

## 2011-07-18 ENCOUNTER — Other Ambulatory Visit: Payer: Self-pay | Admitting: Internal Medicine

## 2011-07-20 NOTE — Telephone Encounter (Signed)
Prescription sent to pharmacy.

## 2011-08-06 ENCOUNTER — Encounter: Payer: Self-pay | Admitting: Internal Medicine

## 2011-08-06 ENCOUNTER — Ambulatory Visit (INDEPENDENT_AMBULATORY_CARE_PROVIDER_SITE_OTHER): Payer: Managed Care, Other (non HMO) | Admitting: *Deleted

## 2011-08-06 DIAGNOSIS — I472 Ventricular tachycardia: Secondary | ICD-10-CM

## 2011-08-06 DIAGNOSIS — Z95810 Presence of automatic (implantable) cardiac defibrillator: Secondary | ICD-10-CM

## 2011-08-10 LAB — REMOTE ICD DEVICE
BATTERY VOLTAGE: 2.66 V
RV LEAD AMPLITUDE: 4.3 mv
RV LEAD IMPEDENCE ICD: 368 Ohm
TOT-0006: 20121226000000
TZAT-0005SLOWVT: 84 pct
TZAT-0005SLOWVT: 84 pct
TZAT-0011FASTVT: 10 ms
TZAT-0011SLOWVT: 10 ms
TZAT-0011SLOWVT: 10 ms
TZAT-0012FASTVT: 200 ms
TZAT-0013FASTVT: 1
TZAT-0013SLOWVT: 2
TZAT-0013SLOWVT: 2
TZAT-0018SLOWVT: NEGATIVE
TZAT-0018SLOWVT: NEGATIVE
TZAT-0019FASTVT: 8 V
TZON-0003FASTVT: 240 ms
TZON-0004SLOWVT: 24
TZON-0005SLOWVT: 12
TZON-0008FASTVT: 0 ms
TZON-0010AFLUTTER: 30 ms
TZST-0001FASTVT: 3
TZST-0001SLOWVT: 4
TZST-0001SLOWVT: 6
TZST-0003FASTVT: 26 J
TZST-0003FASTVT: 35 J
TZST-0003SLOWVT: 35 J
TZST-0003SLOWVT: 35 J

## 2011-08-12 NOTE — Progress Notes (Signed)
Remote icd check  

## 2011-08-17 ENCOUNTER — Ambulatory Visit (INDEPENDENT_AMBULATORY_CARE_PROVIDER_SITE_OTHER): Payer: Managed Care, Other (non HMO)

## 2011-08-17 DIAGNOSIS — D518 Other vitamin B12 deficiency anemias: Secondary | ICD-10-CM

## 2011-08-17 DIAGNOSIS — D519 Vitamin B12 deficiency anemia, unspecified: Secondary | ICD-10-CM

## 2011-08-17 MED ORDER — CYANOCOBALAMIN 1000 MCG/ML IJ SOLN
1000.0000 ug | Freq: Once | INTRAMUSCULAR | Status: AC
  Administered 2011-08-17: 1000 ug via INTRAMUSCULAR

## 2011-08-19 ENCOUNTER — Encounter: Payer: Self-pay | Admitting: *Deleted

## 2011-08-24 ENCOUNTER — Other Ambulatory Visit: Payer: Self-pay | Admitting: *Deleted

## 2011-08-24 MED ORDER — SOTALOL HCL 120 MG PO TABS: 120.0000 mg | ORAL_TABLET | Freq: Two times a day (BID) | ORAL | Status: DC

## 2011-08-31 ENCOUNTER — Other Ambulatory Visit: Payer: Self-pay | Admitting: Internal Medicine

## 2011-08-31 MED ORDER — SITAGLIPTIN PHOS-METFORMIN HCL 50-1000 MG PO TABS: 1.0000 | ORAL_TABLET | Freq: Two times a day (BID) | ORAL | Status: DC

## 2011-08-31 NOTE — Telephone Encounter (Signed)
RX sent

## 2011-08-31 NOTE — Telephone Encounter (Signed)
refill Janumet 50-1,000 MG Tablet Qty 90 (Must be 90-days due to insurance coverage)  Last written 1.24.13 Last qty 60 Last instructions TAKE 1 TABLET BY MOUTH 2 TIMES DAILY WITH A MEAL. Last OV 11.2.12

## 2011-09-15 ENCOUNTER — Other Ambulatory Visit: Payer: Self-pay

## 2011-09-15 DIAGNOSIS — K2270 Barrett's esophagus without dysplasia: Secondary | ICD-10-CM

## 2011-09-15 MED ORDER — OMEPRAZOLE 20 MG PO CPDR: 20.0000 mg | DELAYED_RELEASE_CAPSULE | Freq: Every day | ORAL | Status: DC

## 2011-09-16 ENCOUNTER — Ambulatory Visit (INDEPENDENT_AMBULATORY_CARE_PROVIDER_SITE_OTHER): Payer: Managed Care, Other (non HMO)

## 2011-09-16 DIAGNOSIS — D518 Other vitamin B12 deficiency anemias: Secondary | ICD-10-CM

## 2011-09-16 DIAGNOSIS — D519 Vitamin B12 deficiency anemia, unspecified: Secondary | ICD-10-CM

## 2011-09-16 MED ORDER — CYANOCOBALAMIN 1000 MCG/ML IJ SOLN
1000.0000 ug | Freq: Once | INTRAMUSCULAR | Status: AC
  Administered 2011-09-16: 1000 ug via INTRAMUSCULAR

## 2011-09-24 ENCOUNTER — Telehealth: Payer: Self-pay | Admitting: Internal Medicine

## 2011-09-24 NOTE — Telephone Encounter (Signed)
New Problem:     Patient is having issues adjusting to his sotalol (BETAPACE) 120 MG tablet (dizziness, headaches, and feels like he is going to pass out).  Please call back.

## 2011-09-24 NOTE — Telephone Encounter (Signed)
Returned call to patient. He says he rec'd new rx for sotalol. Says his RX was changed d/t "insurance reasons". Says he is now taking sotalol HCL 120 mg tablets. Since RX was switched to generic, he has noticed intermittent spells of dizziness upon position change and sometimes feels near syncopal.  He denies active symptoms at this time, but says these symptoms "come and go". HR has been 70-80 BPM. Says last ICD interrogation showed all values to be within normal range. Was told ICD may need to be changed out in 1 year. Denies ICD shock or tachycardia.is able to go to work as usual.  I advised him to try to check BP sitting and standing while awaiting response from Dr. Leanne Lovely nurse.  Will send message to Dr. Ladona Ridgel and his nurse. Patient agrees with this plan.

## 2011-09-28 ENCOUNTER — Telehealth: Payer: Self-pay

## 2011-09-28 ENCOUNTER — Other Ambulatory Visit: Payer: Self-pay | Admitting: *Deleted

## 2011-09-28 MED ORDER — DIGOXIN 125 MCG PO TABS: 125.0000 ug | ORAL_TABLET | Freq: Every day | ORAL | Status: DC

## 2011-09-28 NOTE — Telephone Encounter (Signed)
Call from patient and he requested a refill of Digoxin, He stated he was completely out and would like to have is as soon as he could. Please advise   KP

## 2011-09-28 NOTE — Telephone Encounter (Signed)
Refill F/u- Digoxin   Patient calling for status of refill as he is completely out of medication, [facility] [place] [place]  [place] .

## 2011-09-28 NOTE — Telephone Encounter (Signed)
insurance requires a 90-day supply of Digoxin Tablet, [facility] [phone]

## 2011-09-29 NOTE — Telephone Encounter (Signed)
Discussed with Dr Ladona Ridgel.  Still having dizzy spells, if things don't continue to get better he is going to call me and let me know.  We will have to do a prior authorization

## 2011-09-30 ENCOUNTER — Other Ambulatory Visit: Payer: Self-pay | Admitting: Internal Medicine

## 2011-09-30 MED ORDER — ATORVASTATIN CALCIUM 20 MG PO TABS: 20.0000 mg | ORAL_TABLET | Freq: Every day | ORAL | Status: DC

## 2011-10-13 ENCOUNTER — Telehealth: Payer: Self-pay | Admitting: Internal Medicine

## 2011-10-13 NOTE — Telephone Encounter (Signed)
Patient request return call at [phone] concerning medication.

## 2011-10-13 NOTE — Telephone Encounter (Signed)
Spoke with the patient He is still having dizzy spells and feels like he is going to pass out.  It has not gotten any better since his last visit.  I will add him on to PA sch on a day Dr Johney Frame is here  Pt agreeable.  Melissa will call to schedule with Nehemiah Settle this week

## 2011-10-21 ENCOUNTER — Telehealth: Payer: Self-pay | Admitting: Internal Medicine

## 2011-10-21 ENCOUNTER — Other Ambulatory Visit: Payer: Self-pay | Admitting: Internal Medicine

## 2011-10-21 ENCOUNTER — Encounter: Payer: Self-pay | Admitting: Cardiology

## 2011-10-21 ENCOUNTER — Encounter: Payer: Self-pay | Admitting: Internal Medicine

## 2011-10-21 ENCOUNTER — Ambulatory Visit (INDEPENDENT_AMBULATORY_CARE_PROVIDER_SITE_OTHER): Payer: Managed Care, Other (non HMO) | Admitting: Cardiology

## 2011-10-21 VITALS — BP 118/66 | HR 71 | Ht 71.0 in | Wt 233.0 lb

## 2011-10-21 DIAGNOSIS — Z95810 Presence of automatic (implantable) cardiac defibrillator: Secondary | ICD-10-CM

## 2011-10-21 DIAGNOSIS — R42 Dizziness and giddiness: Secondary | ICD-10-CM

## 2011-10-21 DIAGNOSIS — I472 Ventricular tachycardia, unspecified: Secondary | ICD-10-CM

## 2011-10-21 LAB — ICD DEVICE OBSERVATION
BATTERY VOLTAGE: 2.65 V
DEV-0020ICD: NEGATIVE
RV LEAD IMPEDENCE ICD: 384 Ohm
TZAT-0001SLOWVT: 1
TZAT-0001SLOWVT: 2
TZAT-0004FASTVT: 8
TZAT-0004SLOWVT: 6
TZAT-0004SLOWVT: 6
TZAT-0005FASTVT: 88 pct
TZAT-0005SLOWVT: 84 pct
TZAT-0005SLOWVT: 84 pct
TZAT-0011FASTVT: 10 ms
TZAT-0013SLOWVT: 2
TZAT-0019FASTVT: 8 V
TZON-0003FASTVT: 240 ms
TZON-0004SLOWVT: 24
TZON-0010AFLUTTER: 30 ms
TZST-0001FASTVT: 3
TZST-0001FASTVT: 6
TZST-0001SLOWVT: 3
TZST-0001SLOWVT: 6
TZST-0003FASTVT: 26 J
TZST-0003FASTVT: 35 J
TZST-0003SLOWVT: 35 J

## 2011-10-21 LAB — BASIC METABOLIC PANEL
Calcium: 9 mg/dL (ref 8.4–10.5)
Creatinine, Ser: 1 mg/dL (ref 0.4–1.5)

## 2011-10-21 MED ORDER — SOTALOL HCL 120 MG PO TABS: 120.0000 mg | ORAL_TABLET | Freq: Two times a day (BID) | ORAL | Status: DC

## 2011-10-21 NOTE — Patient Instructions (Addendum)
Remote monitoring is used to monitor your Pacemaker of ICD from home. This monitoring reduces the number of office visits required to check your device to one time per year. It allows [place] to keep an eye on the functioning of your device to ensure it is working properly. You are scheduled for a device check from home on January 25, 2012. You may send your transmission at any time that day. If you have a wireless device, the transmission will be sent automatically. After your physician reviews your transmission, you will receive a postcard with your next transmission date.  Your physician has requested that you have an echocardiogram. Echocardiography is a painless test that uses sound waves to create images of your heart. It provides your doctor with information about the size and shape of your heart and how well your heart's chambers and valves are working. This procedure takes approximately one hour. There are no restrictions for this procedure.  Your physician recommends that you return for lab work in: today.   [facility]

## 2011-10-21 NOTE — Telephone Encounter (Signed)
[facility] [place] [facility] for betapace. Please call

## 2011-10-21 NOTE — Progress Notes (Signed)
ELECTROPHYSIOLOGY OFFICE NOTE  Patient ID: [name] MRN: [id number], DOB/AGE: 1951-01-10   Date of Visit: 10/21/2011  Primary Physician: Marga Melnick, MD Primary Cardiologist: Lewayne Bunting, MD Reason for Visit: Device follow-up; dizziness  History of Present Illness Mr. Longsworth is a 61 year old gentleman with a NICM, EF 25-30% s/p single chamber ICD implantation, paroxysmal VT, HTN and DM who presents for device follow-up and reports dizziness intermittently throughout the day. He has noticed this daily, usually 3-4 times. He denies any other associated symptoms. He denies syncope. He denies chest pain, shortness of breath or palpitations. He denies LE swelling, orthopnea or PND. He feels it may be related to his sotalol prescription because this was switched to generic at the same time of his symptom onset. He denies any other changes or additions to his medication regimen. He denies ICD shocks. He has checked his pulse and blood sugar during his episodes which he tells me "are both fine."   Past Medical History  Diagnosis Date  . Ventricular tachycardia     s/p AICD;  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 and placed on sotalol  . Hypertension   . Diabetes mellitus   . NICM (nonischemic cardiomyopathy)     normal cors by cath in 2005  . Prostate cancer     s/p prostatectomy  . Sleep apnea   . Hyperlipidemia   . Chronic systolic heart failure   . Anxiety     since defibrillator placement  . Arthritis     knees  . Cataract   . GERD (gastroesophageal reflux disease)     Past Surgical History  Procedure Date  . Cardiac defibrillator placement 03/11/2004    Medtronic Maximo single lead  . Prostatectomy   . Rectal surgery   . Upper gastrointestinal endoscopy 08/2004, 02/24/11    Barrett's esophagus  . Colonoscopy w/ biopsies and polypectomy 08/2004, 02/24/11    6mm adenoma in 2006,  5 mm polyp (not recovered) 2012    Allergies/Intolerances No Known Allergies  Current Home  Medications Medication Sig Dispense Refill  . atorvastatin (LIPITOR) 20 MG tablet Take 1 tablet (20 mg total) by mouth daily.  90 tablet  3  . carvedilol (COREG) 12.5 MG tablet TAKE 1 & 1/2 TABLETS TWICE A DAY WITH MEALS  90 tablet  2  . digoxin (LANOXIN) 0.125 MG tablet Take 1 tablet (125 mcg total) by mouth daily.  90 tablet  1  . glucose blood test strip Use as instructed  100 each  11  . Lancets (ONETOUCH ULTRASOFT) lancets PRN  100 each  11  . omeprazole (PRILOSEC) 20 MG capsule Take 1 capsule (20 mg total) by mouth daily. 30 minutes before breakfast  90 capsule  4  . ramipril (ALTACE) 10 MG capsule TAKE ONE CAPSULE BY MOUTH TWICE A DAY  60 capsule  5  . sitaGLIPtan-metformin (JANUMET) 50-1000 MG per tablet Take 1 tablet by mouth 2 (two) times daily with a meal.  180 tablet  1  . urea (CARMOL) 40 % CREA As dircted      . sotalol (BETAPACE) 120 MG tablet Take 1 tablet (120 mg total) by mouth 2 (two) times daily.  180 tablet  3   Social History Social History  . Marital Status: Married    Spouse Name: N/A    Number of Children: 2  . Years of Education: N/A   Occupational History  . Information systems manager   Social History Main Topics  . Smoking  status: Never Smoker   . Smokeless tobacco: Never Used  . Alcohol Use: No  . Drug Use: No  . Sexually Active: Not on file   Review of Systems General:  No chills, fever, night sweats or weight changes Cardiovascular:  No chest pain, dyspnea on exertion, edema, orthopnea, palpitations, paroxysmal nocturnal dyspnea Dermatological: No rash, lesions or masses Respiratory: No cough, dyspnea Urologic: No hematuria, dysuria Abdominal:   No nausea, vomiting, diarrhea, bright red blood per rectum, melena, or hematemesis Neurologic:  No visual changes, weakness, changes in mental status All other systems reviewed and are otherwise negative except as noted above.  Physical Exam Blood pressure 118/66, pulse 71, height 5\' 11"  (1.803 m), weight  233 lb (105.688 kg).  General: Well developed, well appearing 61 year old male in no acute distress. HEENT: Normocephalic, atraumatic. EOMs intact. Sclera nonicteric. Oropharynx clear.  Neck: Supple. No JVD. Lungs:  Respirations regular and unlabored, CTA bilaterally. No wheezes, rales or rhonchi. Heart: RRR. S1, S2 present. No murmurs, rub, S3 or S4. Abdomen: Soft, non-distended.  Extremities: No clubbing, cyanosis or edema. DP/PT/Radials 2+ and equal bilaterally. Psych: Normal affect. Neuro: Alert and oriented X 3. Moves all extremities spontaneously.  Radiology/Studies Echocardiogram  01/01/11: EF 25-30%, mild LVH, grade 1 diastolic dysfunction, mild LAE, PASP 34 12-lead ECG  Normal sinus rhythm with normal intervals at 71 bpm; PR 176 ms; QRS 100 ms; QT/QTc 392/425 ms Device interrogation Normal ICD function; only 2 tachy episodes which were brief and self-terminating, lasting <10 beats, and do not correlate with daily dizzy spells; no programming changes were made; see PaceArt report  Assessment and Plan 1. Dizziness - ? etiology; patient feels this began when his sotalol prescription was switched to generic so we will try renewing his prescription for brand name only; also, will order a BMP to evaluate his electrolytes and renal function; he has history of normal renal function and has been on a stable dose of digoxin; ? bradycardia with both carvedilol and sotalol, although neither of these are new medications for him but will consider Holter monitor if dizziness persists; he was also instructed to follow-up with his PCP 2. Paroxysmal VT - continue sotalol as previously directed by Dr. Ladona Ridgel; as above, will order brand name only and follow; device function is normal with no significant/sustained episodes/no therapies given 3. NICM - patient euvolemic by exam today; continue medical therapy; continue low sodium, fluid restricted diet   This plan of care was formulated with Dr. Johney Frame  who is in clinic with me today. Signed, Rick Duff, PA-C 10/21/2011, 1:56 PM

## 2011-10-22 ENCOUNTER — Telehealth: Payer: Self-pay | Admitting: Internal Medicine

## 2011-10-22 NOTE — Telephone Encounter (Signed)
error 

## 2011-10-22 NOTE — Telephone Encounter (Signed)
F/u   Please return call to patient regarding medication, he can be reached at [phone]

## 2011-10-22 NOTE — Telephone Encounter (Signed)
F/u patient call  [facility] requesting the doctor call for prior authorization for Betapace RX.  Please call [employer] [phone] regarding this matter.  Patient is waiting on medication.

## 2011-10-22 NOTE — Telephone Encounter (Signed)
Spoke with pharmacist yesterday.  They were going to inform patient of the cost of brad Betapace.  It will cost him $779 and the generic will cost him $10.  He is calling ba  I have called [facility] [employer] and they stated that we need to write a letter on letterhead.  Write we are appealing the DAW penalty.  Put Urgent on top and fax to [phone].  Put his ID or WGN([id number]) on letter as well.  I have talked with the patient and let him know what we have to do and that this may take up to 72 hours

## 2011-11-13 ENCOUNTER — Encounter: Payer: Managed Care, Other (non HMO) | Admitting: *Deleted

## 2011-11-16 ENCOUNTER — Encounter: Payer: Self-pay | Admitting: *Deleted

## 2011-11-16 NOTE — Telephone Encounter (Signed)
I do not know why the patient cannot use generic sotalol.  GT

## 2011-11-18 NOTE — Telephone Encounter (Signed)
It does not work for him.  He has tried.  When he saw [employer] called in for him and requested the brand and this is still pending. Needs a letter written by MD on letter head to pay for brand

## 2011-11-27 ENCOUNTER — Ambulatory Visit (INDEPENDENT_AMBULATORY_CARE_PROVIDER_SITE_OTHER): Payer: Managed Care, Other (non HMO)

## 2011-11-27 DIAGNOSIS — D518 Other vitamin B12 deficiency anemias: Secondary | ICD-10-CM

## 2011-11-27 DIAGNOSIS — D519 Vitamin B12 deficiency anemia, unspecified: Secondary | ICD-10-CM

## 2011-11-27 MED ORDER — CYANOCOBALAMIN 1000 MCG/ML IJ SOLN
1000.0000 ug | Freq: Once | INTRAMUSCULAR | Status: AC
  Administered 2011-11-27: 1000 ug via INTRAMUSCULAR

## 2011-12-08 ENCOUNTER — Other Ambulatory Visit: Payer: Self-pay | Admitting: Cardiology

## 2011-12-08 MED ORDER — SOTALOL HCL 120 MG PO TABS: 120.0000 mg | ORAL_TABLET | Freq: Two times a day (BID) | ORAL | Status: DC

## 2011-12-10 ENCOUNTER — Telehealth: Payer: Self-pay | Admitting: *Deleted

## 2011-12-10 ENCOUNTER — Telehealth: Payer: Self-pay

## 2011-12-10 NOTE — Telephone Encounter (Signed)
Faxed over request and wrote that patient is unable to take generic due to ineffectiveness.  Asked to fill with brand only

## 2011-12-10 NOTE — Telephone Encounter (Signed)
Pharmacy called says they faxed prescription refill 3 days ago and have not heard response Pt out of med and needs ASAP  betapace

## 2011-12-10 NOTE — Telephone Encounter (Signed)
Message copied by Deliah Boston on Thu Dec 10, 2011 10:40 AM ------      Message from: Omar Person B      Created: Thu Oct 22, 2011  3:48 PM      Regarding: Echo       Pt didn't want to schedule this echo - he stated that he had this done while in the hospital.

## 2011-12-10 NOTE — Telephone Encounter (Signed)
Pt wife left vm on triage line to advise the refill was sent for #90 to [facility] yesterday for the Sotalol however the NAME brand was sent and pt was advised to call MD Hopper office to see if the generic brand can be sent instead, please advise

## 2011-12-11 ENCOUNTER — Telehealth: Payer: Self-pay | Admitting: Internal Medicine

## 2011-12-11 MED ORDER — SOTALOL HCL 120 MG PO TABS: 120.0000 mg | ORAL_TABLET | Freq: Two times a day (BID) | ORAL | Status: DC

## 2011-12-11 NOTE — Telephone Encounter (Signed)
New msg Pt called and said betapace rx is too expensive. Please call back to discuss

## 2011-12-11 NOTE — Telephone Encounter (Signed)
Pt going out of town Monday and wanted script [facility] [place]. He also said med was going to cost $700. For three month supply. Pt said if [facility] cant do [facility] on price he wants generic, I talked with pharmacist and gave my number to call back if generic is requested and i will resend. All agreed to plan.

## 2011-12-11 NOTE — Telephone Encounter (Signed)
I called patient's wife after reviewing medication refill history and left message on voicemail informing her that the prescribing MD was Dr.Gregg Ladona Ridgel and she needs to follow-up with there office

## 2012-01-06 ENCOUNTER — Encounter: Payer: Self-pay | Admitting: *Deleted

## 2012-01-07 ENCOUNTER — Ambulatory Visit (INDEPENDENT_AMBULATORY_CARE_PROVIDER_SITE_OTHER): Payer: Managed Care, Other (non HMO) | Admitting: *Deleted

## 2012-01-07 DIAGNOSIS — E538 Deficiency of other specified B group vitamins: Secondary | ICD-10-CM

## 2012-01-07 MED ORDER — CYANOCOBALAMIN 1000 MCG/ML IJ SOLN
1000.0000 ug | Freq: Once | INTRAMUSCULAR | Status: AC
  Administered 2012-01-07: 1000 ug via INTRAMUSCULAR

## 2012-02-08 ENCOUNTER — Other Ambulatory Visit: Payer: Self-pay | Admitting: Internal Medicine

## 2012-02-23 ENCOUNTER — Telehealth: Payer: Self-pay | Admitting: Internal Medicine

## 2012-02-23 NOTE — Telephone Encounter (Signed)
Spoke w/pt in regards to transmission that was missed. Pt is scheduled to see GT on 04-27-12. Pt to then start with sending remotes every 3 mths. Pt aware and agrees. kwm

## 2012-02-23 NOTE — Telephone Encounter (Signed)
New Problem:    Patient called in wanting to speak with you about his reminder to have his device checked.  Please call back.

## 2012-03-01 ENCOUNTER — Other Ambulatory Visit: Payer: Self-pay | Admitting: Internal Medicine

## 2012-03-01 DIAGNOSIS — D510 Vitamin B12 deficiency anemia due to intrinsic factor deficiency: Secondary | ICD-10-CM

## 2012-03-01 DIAGNOSIS — E119 Type 2 diabetes mellitus without complications: Secondary | ICD-10-CM

## 2012-03-01 NOTE — Telephone Encounter (Signed)
I called patient, scheduled lab appointment for tomorrow. Once lab results back 90 day supply can be given. Patient was due for labs in May 2013. 90 supply will not be given at this time due to labs OVERDUE.  Patient was offered samples 1-2 weeks worth and he indicated he has enough to last til results back

## 2012-03-01 NOTE — Telephone Encounter (Signed)
received fax stating pt insurance requires a 90-day supply of medication and it has been rejected due to qt. please review

## 2012-03-01 NOTE — Addendum Note (Signed)
Addended by: Maurice Small on: 03/01/2012 11:25 AM   Modules accepted: Orders

## 2012-03-01 NOTE — Telephone Encounter (Signed)
Also urine microalbumin; he OV after labs to F/U

## 2012-03-01 NOTE — Addendum Note (Signed)
Addended by: Maurice Small on: 03/01/2012 01:28 PM   Modules accepted: Orders

## 2012-03-01 NOTE — Telephone Encounter (Signed)
Please contact patient to schedule follow-up appointment. Patient will have labs tomorrow. F/u can be set up for early part of next week

## 2012-03-01 NOTE — Telephone Encounter (Signed)
A1C,CBCD,B12 281.0/250.00

## 2012-03-01 NOTE — Telephone Encounter (Signed)
Pt scheduled  

## 2012-03-02 ENCOUNTER — Other Ambulatory Visit (INDEPENDENT_AMBULATORY_CARE_PROVIDER_SITE_OTHER): Payer: Managed Care, Other (non HMO)

## 2012-03-02 ENCOUNTER — Telehealth: Payer: Self-pay

## 2012-03-02 DIAGNOSIS — D510 Vitamin B12 deficiency anemia due to intrinsic factor deficiency: Secondary | ICD-10-CM

## 2012-03-02 DIAGNOSIS — E119 Type 2 diabetes mellitus without complications: Secondary | ICD-10-CM

## 2012-03-02 LAB — CBC WITH DIFFERENTIAL/PLATELET
Basophils Relative: 0.2 % (ref 0.0–3.0)
Eosinophils Relative: 1 % (ref 0.0–5.0)
HCT: 38 % — ABNORMAL LOW (ref 39.0–52.0)
Hemoglobin: 11.9 g/dL — ABNORMAL LOW (ref 13.0–17.0)
Lymphocytes Relative: 28.8 % (ref 12.0–46.0)
Lymphs Abs: 2.2 10*3/uL (ref 0.7–4.0)
Monocytes Relative: 7.8 % (ref 3.0–12.0)
Neutro Abs: 4.7 10*3/uL (ref 1.4–7.7)
RBC: 5.14 Mil/uL (ref 4.22–5.81)
WBC: 7.6 10*3/uL (ref 4.5–10.5)

## 2012-03-02 NOTE — Telephone Encounter (Signed)
Left message on cell phone informing patient to please stop by the office to recollect urine sample to be sent off for MALB (order was already placed)  1.) Patient will need to collect urine specimen 2.) Specimen top to be securely placed and tagged (name, dob,mrn) 3.) Sent to [facility]

## 2012-03-03 LAB — VITAMIN B12: Vitamin B-12: 253 pg/mL (ref 211–911)

## 2012-03-03 NOTE — Addendum Note (Signed)
Addended by: Silvio Pate D on: 03/03/2012 03:37 PM   Modules accepted: Orders

## 2012-03-04 LAB — MICROALBUMIN / CREATININE URINE RATIO: Microalb Creat Ratio: 0.6 mg/g (ref 0.0–30.0)

## 2012-03-10 ENCOUNTER — Other Ambulatory Visit: Payer: Self-pay | Admitting: Internal Medicine

## 2012-03-10 ENCOUNTER — Ambulatory Visit (INDEPENDENT_AMBULATORY_CARE_PROVIDER_SITE_OTHER): Payer: Managed Care, Other (non HMO) | Admitting: Internal Medicine

## 2012-03-10 ENCOUNTER — Encounter: Payer: Self-pay | Admitting: Internal Medicine

## 2012-03-10 VITALS — BP 124/80 | HR 67 | Wt 242.2 lb

## 2012-03-10 DIAGNOSIS — Z23 Encounter for immunization: Secondary | ICD-10-CM

## 2012-03-10 DIAGNOSIS — I10 Essential (primary) hypertension: Secondary | ICD-10-CM

## 2012-03-10 DIAGNOSIS — E538 Deficiency of other specified B group vitamins: Secondary | ICD-10-CM

## 2012-03-10 DIAGNOSIS — E119 Type 2 diabetes mellitus without complications: Secondary | ICD-10-CM

## 2012-03-10 MED ORDER — SITAGLIPTIN PHOS-METFORMIN HCL 50-1000 MG PO TABS: ORAL_TABLET | ORAL | Status: DC

## 2012-03-10 NOTE — Patient Instructions (Addendum)
Eat a low-fat diet with lots of fruits and vegetables, up to 7-9 servings per day. Consume less than 40 (preferably ZERO) grams of sugar per day from foods & drinks with High Fructose Corn Syrup (HFCS) sugar as #1,2,3 or # 4 on label.Whole Foods, [employer] Jo[employer] & [employer] do not carry products with HFCS. Follow a  low carb nutrition program such as [place] or The New Sugar Busters  to prevent Diabetes progression . White carbohydrates (potatoes, rice, bread, and pasta) have a high spike of sugar and a high load of sugar. For example a  baked potato has a cup of sugar and a  french fry  2 teaspoons of sugar. Yams, wild  rice, whole grained bread &  wheat pasta have been much lower spike and load of  sugar. Portions should be the size of a deck of cards or your palm.  A1c assesses average 24 hour  glucose over prior 6-12 weeks. A1c GOALS:  Good diabetic control: 6.5-7 %  Fair diabetic control: 7-8 %  Poor diabetic control: greater than 8 % ( except with additional factors such as  advanced age; significant coronary or neurologic disease,etc). Check the A1c every 6 months if it is < 6.5%; every 4 months if  6.5% or higher. Goals for home glucose monitoring are : fasting  or morning glucose goal of  100-150. Two hours after any meal , goal = < 180, preferably < 160. Report any low blood glucoses immediately. Please schedule A1c & urine microalbumin in 4 months after nutritional & exercise interventions as discussed. PLEASE BRING THESE INSTRUCTIONS TO FOLLOW UP  LAB APPOINTMENT.This will guarantee correct labs are drawn, eliminating need for repeat blood sampling ( needle sticks ! ). Diagnoses /Codes: 250.02 Eye exam annually. Use an anti-inflammatory cream such as Aspercreme or Zostrix cream twice a day to the  knees as needed. In lieu of this warm moist compresses or  hot water bottle can be used. Do not apply ice to the knees.

## 2012-03-10 NOTE — Assessment & Plan Note (Signed)
Blood pressure excellent today; monitor indicated to verify control at home. Blood pressure goals discussed.

## 2012-03-10 NOTE — Progress Notes (Signed)
Subjective:    Patient ID: [name], male    DOB: [birth year], 61 y.o.   MRN: [id number]  HPI Diabetes status assessment: Fasting or morning glucose range:  Not checked X 6 mos . Hypoglycemia :  Intermittent dizziness & diaphoresis after prolonged fast.                                                                                                                                                                                       Exercise : no . Nutrition/diet:  Low fat/cholesterol. Medication compliance : yes. Medication adverse  Effects: no . Eye exam : 2012. Foot care : 2013.  A1c/ urine microalbumin monitor:  7.1%   He remains mildly anemic with hematocrit of 38; B12 shots are taken monthly. B12 level had increased from 148 to 253. He denies melena or rectal bleeding. Colonoscopy is up-to-date    Review of Systems Excess thirst :no;  Excess hunger:  no ;  Excess urination: no.                                 Lightheadedness with standing:  occasionally. Chest pain: no ; Palpitations :no ;  Pain in  calves with walking:  No but nocturnal cramps . Non healing skin  ulcers or sores,especially over the feet:  no. Numbness or tingling or burning in feet : no .                                                                                                                                        Significant change in  Weight : up 10# . Vision changes : no  .  Objective:   Physical Exam  Gen.:  well-nourished, appropriate and alert, weight Eyes: No lid/conjunctival changes, extraocular motion intact, fundi Neck: Normal range of motion, thyroid Respiratory: No increased work of breathing or abnormal breath sounds Cardiac : regular rhythm, no extra heart sounds, gallop, murmur Abdomen: No organomegaly ,masses, bruits or aortic enlargement Lymph: No lymphadenopathy of the neck or axilla Skin: No rashes,  lesions, ulcers or ischemic changes Muscle skeletal: no nail changes; joints Vasc:All pulses intact, no bruits present Neuro: Normal deep tendon reflexes, alert & oriented, sensation over feet Psych: judgment and insight, mood and affect normal       Assessment & Plan:

## 2012-03-10 NOTE — Assessment & Plan Note (Signed)
Janumet dose will not be changed. Emphasis will be on avoiding hyperglycemic carbs. A1c will be checked with urine microalbumin in 4 months. A1c goal is less than 7% as long as he is not having hypoglycemia. He'll be asked to avoid prolonged fasting

## 2012-04-27 ENCOUNTER — Encounter: Payer: Self-pay | Admitting: Internal Medicine

## 2012-04-27 ENCOUNTER — Ambulatory Visit (INDEPENDENT_AMBULATORY_CARE_PROVIDER_SITE_OTHER): Payer: Managed Care, Other (non HMO) | Admitting: Internal Medicine

## 2012-04-27 VITALS — BP 130/88 | HR 79 | Ht 71.0 in | Wt 241.0 lb

## 2012-04-27 DIAGNOSIS — I428 Other cardiomyopathies: Secondary | ICD-10-CM

## 2012-04-27 DIAGNOSIS — I472 Ventricular tachycardia, unspecified: Secondary | ICD-10-CM

## 2012-04-27 DIAGNOSIS — Z95810 Presence of automatic (implantable) cardiac defibrillator: Secondary | ICD-10-CM

## 2012-04-27 NOTE — Assessment & Plan Note (Signed)
His ventricular tachycardia has been quiet. No change in medical therapy today. We'll plan to see the patient back in several months.

## 2012-04-27 NOTE — Assessment & Plan Note (Signed)
His Medtronic single-chamber ICD is working normally. He is approaching elective replacement.  because he has a Medtronic [id number] defibrillator lead, we will plan lead revision at the time of his ICD generator change which I anticipate to be done in several months.

## 2012-04-27 NOTE — Patient Instructions (Signed)
Remote monitoring is used to monitor your Pacemaker of ICD from home. This monitoring reduces the number of office visits required to check your device to one time per year. It allows [place] to keep an eye on the functioning of your device to ensure it is working properly. You are scheduled for a device check from home on August 01, 2012. You may send your transmission at any time that day. If you have a wireless device, the transmission will be sent automatically. After your physician reviews your transmission, you will receive a postcard with your next transmission date.  Send a transmission if you hear any alert beeping from your device.  Your physician wants you to follow-up in: 1 year with Dr Ladona Ridgel.  You will receive a reminder letter in the mail two months in advance. If you don't receive a letter, please call our office to schedule the follow-up appointment.

## 2012-04-27 NOTE — Progress Notes (Signed)
HPI [name] returns today for followup. He is a 61 year old man with a history of nonischemic cardiomyopathy, chronic systolic heart failure, and ventricular tachycardia. His last ventricular tachycardia exacerbation occurred in 2012. Since then, he is been maintained on sotalol and done well. He denies any recent ICD shocks. No chest pain or shortness of breath. No syncope. No Known Allergies   Current Outpatient Prescriptions  Medication Sig Dispense Refill  . atorvastatin (LIPITOR) 20 MG tablet Take 1 tablet (20 mg total) by mouth daily.  90 tablet  3  . carvedilol (COREG) 12.5 MG tablet TAKE 1 & 1/2 TABLETS TWICE A DAY WITH MEALS  270 tablet  1  . digoxin (LANOXIN) 0.125 MG tablet TAKE 1 TABLET (125 MCG TOTAL) BY MOUTH DAILY.  90 tablet  2  . omeprazole (PRILOSEC) 20 MG capsule Take 1 capsule (20 mg total) by mouth daily. 30 minutes before breakfast  90 capsule  4  . ramipril (ALTACE) 10 MG capsule TAKE ONE CAPSULE BY MOUTH TWICE A DAY  180 capsule  0  . sitaGLIPtan-metformin (JANUMET) 50-1000 MG per tablet 1 qd with largest meal  90 tablet  1  . sotalol (BETAPACE) 120 MG tablet Take 1 tablet (120 mg total) by mouth 2 (two) times daily.  180 tablet  3  . urea (CARMOL) 40 % CREA As dircted         Past Medical History  Diagnosis Date  . Ventricular tachycardia     s/p AICD;  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 and placed on sotalol  . Hypertension   . Diabetes mellitus   . NICM (nonischemic cardiomyopathy)     normal cors by cath in 2005  . Prostate cancer     s/p prostatectomy  . Sleep apnea   . Hyperlipidemia   . Chronic systolic heart failure   . Anxiety     since defibulator placement  . Arthritis     knees  . Cataract   . GERD (gastroesophageal reflux disease)     ROS:   All systems reviewed and negative except as noted in the HPI.   Past Surgical History  Procedure Date  . Cardiac defibrillator placement 03/11/2004    Medtronic Maximo single lead  . Prostatectomy   . Rectal  surgery   . Upper gastrointestinal endoscopy 08/2004, 02/24/11    Barrett's esophagus  . Colonoscopy w/ biopsies and polypectomy 08/2004, 02/24/11    6mm adenoma in 2006,  5 mm polyp (not recovered) 2012     Family History  Problem Relation Age of Onset  . Coronary artery disease Father   . Hypertension Mother   . Prostate cancer Father   . Coronary artery disease    . Diabetes       History   Social History  . Marital Status: Married    Spouse Name: N/A    Number of Children: 2  . Years of Education: N/A   Occupational History  . Information systems manager   Social History Main Topics  . Smoking status: Never Smoker   . Smokeless tobacco: Never Used  . Alcohol Use: No  . Drug Use: No  . Sexually Active: Not on file   Other Topics Concern  . Not on file   Social History Narrative  . No narrative on file     BP 130/88  Pulse 79  Ht 5\' 11"  (1.803 m)  Wt 241 lb (109.317 kg)  BMI 33.61 kg/m2  SpO2 98%  Physical Exam:  Well appearing middle-aged man, NAD HEENT: Unremarkable Neck:  7 cm JVD, no thyromegally Lungs:  Clear with no wheezes, rales, or rhonchi. HEART:  Regular rate rhythm, no murmurs, no rubs, no clicks Abd:  soft, positive bowel sounds, no organomegally, no rebound, no guarding Ext:  2 plus pulses, no edema, no cyanosis, no clubbing Skin:  No rashes no nodules Neuro:  CN II through XII intact, motor grossly intact  EKG normal sinus rhythm with LVH and rare PVCs  DEVICE  Normal device function.  See PaceArt for details.   Assess/Plan:

## 2012-04-29 LAB — ICD DEVICE OBSERVATION
BRDY-0002RV: 40 {beats}/min
DEV-0020ICD: NEGATIVE
RV LEAD IMPEDENCE ICD: 384 Ohm
RV LEAD THRESHOLD: 1 V
TZAT-0001FASTVT: 1
TZAT-0001SLOWVT: 1
TZAT-0001SLOWVT: 2
TZAT-0004SLOWVT: 6
TZAT-0004SLOWVT: 6
TZAT-0005SLOWVT: 84 pct
TZAT-0011FASTVT: 10 ms
TZAT-0013SLOWVT: 2
TZAT-0013SLOWVT: 2
TZAT-0018FASTVT: NEGATIVE
TZAT-0018SLOWVT: NEGATIVE
TZAT-0019FASTVT: 8 V
TZAT-0020SLOWVT: 1.6 ms
TZAT-0020SLOWVT: 1.6 ms
TZON-0004SLOWVT: 24
TZON-0008FASTVT: 0 ms
TZON-0010AFLUTTER: 30 ms
TZON-0010SLOWVT: 30 ms
TZON-0011AFLUTTER: 70
TZST-0001FASTVT: 2
TZST-0001FASTVT: 3
TZST-0001FASTVT: 4
TZST-0001FASTVT: 6
TZST-0001SLOWVT: 3
TZST-0001SLOWVT: 5
TZST-0001SLOWVT: 6
TZST-0003FASTVT: 26 J
TZST-0003FASTVT: 35 J
TZST-0003FASTVT: 35 J
TZST-0003SLOWVT: 35 J
TZST-0003SLOWVT: 35 J
TZST-0003SLOWVT: 35 J

## 2012-05-02 ENCOUNTER — Ambulatory Visit (INDEPENDENT_AMBULATORY_CARE_PROVIDER_SITE_OTHER): Payer: Managed Care, Other (non HMO) | Admitting: Internal Medicine

## 2012-05-02 ENCOUNTER — Encounter: Payer: Self-pay | Admitting: Internal Medicine

## 2012-05-02 VITALS — BP 136/92 | HR 78 | Temp 98.2°F | Wt 241.6 lb

## 2012-05-02 DIAGNOSIS — J209 Acute bronchitis, unspecified: Secondary | ICD-10-CM

## 2012-05-02 DIAGNOSIS — J029 Acute pharyngitis, unspecified: Secondary | ICD-10-CM

## 2012-05-02 LAB — POCT RAPID STREP A (OFFICE): Rapid Strep A Screen: NEGATIVE

## 2012-05-02 MED ORDER — AMOXICILLIN 500 MG PO CAPS: 500.0000 mg | ORAL_CAPSULE | Freq: Three times a day (TID) | ORAL | Status: DC

## 2012-05-02 MED ORDER — HYDROCODONE-HOMATROPINE 5-1.5 MG/5ML PO SYRP: 5.0000 mL | ORAL_SOLUTION | Freq: Four times a day (QID) | ORAL | Status: DC | PRN

## 2012-05-02 NOTE — Patient Instructions (Addendum)
Zicam Melts or Zinc lozenges ; vitamin C 2000 mg daily; & Echinacea for 4-7 days. Report fever, exudate("pus") or progressive pain.   Please employ the CPAP to prevent increased intrathoracic pressure ; potentially health threatening cardiac irregularities; and heart failure.

## 2012-05-02 NOTE — Progress Notes (Signed)
  Subjective:    Patient ID: [name], male    DOB: 03/11/1951, 61 y.o.   MRN: [id number]  HPI  Symptoms began 3 weeks ago as a sore throat and dry cough. He also had associated nausea and vomiting and loose stool. He did have the flu shot this year.  The symptoms did improve but the sore throat and cough have persisted. He also experienced facial pain without nasal purulence. He has had yellow sputum reduction  With facial pain he has watery discharge from his eyes without purulent character.  He had only partial response to Coricidin HP.    Review of Systems He denies fever, chills, sweats, shortness of breath, or wheezing.  He has sleep apnea but is not using a CPAP machine     Objective:   Physical Exam General appearance:good health ;well nourished; no acute distress or increased work of breathing is present.  No  lymphadenopathy about the head, neck, or axilla noted.   Eyes: No conjunctival inflammation or lid edema is present.   Ears:  External ear exam shows no significant lesions or deformities.  Otoscopic examination reveals clear canals, tympanic membranes are intact bilaterally without bulging, retraction, inflammation or discharge.  Nose:  External nasal examination shows no deformity or inflammation. Nasal mucosa are pink and moist without lesions or exudates. No septal dislocation or deviation.No obstruction to airflow.   Oral exam: Dental hygiene is good; lips and gums are healthy appearing.There is mild oropharyngeal erythema . No exudate noted. There is crowding of the oropharynx  Neck:  No deformities, thyromegaly,or  masses noted.   Supple with full range of motion without pain. Tender submandibular glands  Heart:  Normal rate and regular rhythm. S1 and S2 normal without gallop, murmur, click, rub or other extra sounds.   Lungs:Chest clear to auscultation; no wheezes, rhonchi,rales ,or rubs present.No increased work of breathing.    Extremities:  No  cyanosis, edema, or clubbing  noted    Skin: Warm & dry .         Assessment & Plan:  #1 acute bronchitis w/o bronchospasm #2 pharyngitis with neg Beta strep #3 sleep apnea, untreated . Risks discussed Plan: See orders and recommendations

## 2012-05-16 ENCOUNTER — Other Ambulatory Visit: Payer: Self-pay | Admitting: Internal Medicine

## 2012-06-20 ENCOUNTER — Telehealth: Payer: Self-pay | Admitting: Internal Medicine

## 2012-06-20 DIAGNOSIS — T50995A Adverse effect of other drugs, medicaments and biological substances, initial encounter: Secondary | ICD-10-CM

## 2012-06-20 DIAGNOSIS — E785 Hyperlipidemia, unspecified: Secondary | ICD-10-CM

## 2012-06-20 MED ORDER — ATORVASTATIN CALCIUM 20 MG PO TABS: 20.0000 mg | ORAL_TABLET | Freq: Every day | ORAL | Status: DC

## 2012-06-20 NOTE — Telephone Encounter (Signed)
refill  Atorvastatin (Tab) 20 MG Take 1 tablet (20 mg total) by mouth daily. #90 wt/4-refills last fill 11.11.13

## 2012-06-20 NOTE — Telephone Encounter (Signed)
RX sent, future orders placed for labs

## 2012-08-01 ENCOUNTER — Encounter: Payer: Managed Care, Other (non HMO) | Admitting: *Deleted

## 2012-08-11 ENCOUNTER — Encounter: Payer: Self-pay | Admitting: *Deleted

## 2012-08-25 ENCOUNTER — Encounter: Payer: Managed Care, Other (non HMO) | Admitting: *Deleted

## 2012-08-29 ENCOUNTER — Encounter: Payer: Self-pay | Admitting: *Deleted

## 2012-08-29 ENCOUNTER — Telehealth: Payer: Self-pay | Admitting: Internal Medicine

## 2012-08-29 ENCOUNTER — Encounter: Payer: Self-pay | Admitting: Internal Medicine

## 2012-08-29 NOTE — Telephone Encounter (Signed)
New problem    C/O Sent  a transmit last night. The de fib was playing music.

## 2012-08-29 NOTE — Telephone Encounter (Signed)
Spoke w/pt in regards to device. Device reached ERI---pt scheduled for 08-31-12 @ 900 with GT. Pt aware of appt.

## 2012-08-31 ENCOUNTER — Encounter: Payer: Self-pay | Admitting: Internal Medicine

## 2012-08-31 ENCOUNTER — Other Ambulatory Visit: Payer: Self-pay | Admitting: *Deleted

## 2012-08-31 ENCOUNTER — Ambulatory Visit (INDEPENDENT_AMBULATORY_CARE_PROVIDER_SITE_OTHER): Payer: Managed Care, Other (non HMO) | Admitting: Internal Medicine

## 2012-08-31 ENCOUNTER — Encounter: Payer: Self-pay | Admitting: *Deleted

## 2012-08-31 VITALS — BP 147/79 | HR 79 | Ht 71.0 in | Wt 242.2 lb

## 2012-08-31 DIAGNOSIS — I428 Other cardiomyopathies: Secondary | ICD-10-CM

## 2012-08-31 DIAGNOSIS — I10 Essential (primary) hypertension: Secondary | ICD-10-CM

## 2012-08-31 DIAGNOSIS — I472 Ventricular tachycardia: Secondary | ICD-10-CM

## 2012-08-31 DIAGNOSIS — Z95810 Presence of automatic (implantable) cardiac defibrillator: Secondary | ICD-10-CM

## 2012-08-31 DIAGNOSIS — Z45018 Encounter for adjustment and management of other part of cardiac pacemaker: Secondary | ICD-10-CM

## 2012-08-31 DIAGNOSIS — I5022 Chronic systolic (congestive) heart failure: Secondary | ICD-10-CM

## 2012-08-31 NOTE — Assessment & Plan Note (Signed)
He has no sustained ventricular arrhythmias. He will continue his current dose of sotalol.

## 2012-08-31 NOTE — Assessment & Plan Note (Signed)
His device has reached elective replacement. He will undergo generator change. With his Medtronic 6949-lead, he will undergo lead revision as well.

## 2012-08-31 NOTE — Progress Notes (Signed)
HPI [name] returns today for followup. He is a very pleasant middle-age man with a nonischemic cardiomyopathy, chronic systolic heart failure, ventricular tachycardia, status post ICD implantation. He has reached elective replacement on his ICD. He has a Medtronic [id number] defibrillation lead. He denies chest pain or shortness of breath. No peripheral edema. No syncope or recurrent ICD shocks. No Known Allergies   Current Outpatient Prescriptions  Medication Sig Dispense Refill  . atorvastatin (LIPITOR) 20 MG tablet Take 1 tablet (20 mg total) by mouth daily. 1 by mouth daily, LABS OVERDUE  30 tablet  0  . carvedilol (COREG) 12.5 MG tablet TAKE 1 & 1/2 TABLETS TWICE A DAY WITH MEALS  270 tablet  1  . digoxin (LANOXIN) 0.125 MG tablet TAKE 1 TABLET (125 MCG TOTAL) BY MOUTH DAILY.  90 tablet  2  . HYDROcodone-homatropine (HYDROMET) 5-1.5 MG/5ML syrup Take 5 mLs by mouth every 6 (six) hours as needed for cough.  120 mL  0  . omeprazole (PRILOSEC) 20 MG capsule Take 1 capsule (20 mg total) by mouth daily. 30 minutes before breakfast  90 capsule  4  . ramipril (ALTACE) 10 MG capsule 1 by mouth twice daily  180 capsule  1  . sitaGLIPtan-metformin (JANUMET) 50-1000 MG per tablet 1 qd with largest meal  90 tablet  1  . sotalol (BETAPACE) 120 MG tablet Take 1 tablet (120 mg total) by mouth 2 (two) times daily.  180 tablet  3  . urea (CARMOL) 40 % CREA As dircted       No current facility-administered medications for this visit.     Past Medical History  Diagnosis Date  . Ventricular tachycardia     s/p AICD;  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 and placed on sotalol  . Hypertension   . Diabetes mellitus   . NICM (nonischemic cardiomyopathy)     normal cors by cath in 2005  . Prostate cancer     s/p prostatectomy  . Sleep apnea   . Hyperlipidemia   . Chronic systolic heart failure   . Anxiety     since defibulator placement  . Arthritis     knees  . Cataract   . GERD (gastroesophageal reflux disease)      ROS:   All systems reviewed and negative except as noted in the HPI.   Past Surgical History  Procedure Laterality Date  . Cardiac defibrillator placement  03/11/2004    Medtronic Maximo single lead  . Prostatectomy    . Rectal surgery    . Upper gastrointestinal endoscopy  08/2004, 02/24/11    Barrett's esophagus  . Colonoscopy w/ biopsies and polypectomy  08/2004, 02/24/11    6mm adenoma in 2006,  5 mm polyp (not recovered) 2012     Family History  Problem Relation Age of Onset  . Coronary artery disease Father   . Hypertension Mother   . Prostate cancer Father   . Coronary artery disease    . Diabetes       History   Social History  . Marital Status: Married    Spouse Name: N/A    Number of Children: 2  . Years of Education: N/A   Occupational History  . Information systems manager   Social History Main Topics  . Smoking status: Never Smoker   . Smokeless tobacco: Never Used  . Alcohol Use: No  . Drug Use: No  . Sexually Active: Not on file   Other Topics Concern  . Not on  file   Social History Narrative  . No narrative on file     BP 147/79  Pulse 79  Ht 5\' 11"  (1.803 m)  Wt 242 lb 3.2 oz (109.861 kg)  BMI 33.79 kg/m2  Physical Exam:  Well appearing middle-aged man,NAD HEENT: Unremarkable Neck:  No JVD, no thyromegally Back:  No CVA tenderness Lungs:  Clear with no wheezes, rales, or rhonchi. HEART:  Regular rate rhythm, no murmurs, no rubs, no clicks Abd:  soft, positive bowel sounds, no organomegally, no rebound, no guarding Ext:  2 plus pulses, no edema, no cyanosis, no clubbing Skin:  No rashes no nodules Neuro:  CN II through XII intact, motor grossly intact  DEVICE  Normal device function.  See PaceArt for details.   Assess/Plan:

## 2012-08-31 NOTE — Assessment & Plan Note (Signed)
His current symptoms are class 1-2. He will continue his current medical therapy, and maintain a low-sodium diet.

## 2012-08-31 NOTE — Patient Instructions (Addendum)
ICD generator change with lead revision  See instruction sheet

## 2012-08-31 NOTE — Assessment & Plan Note (Signed)
His blood pressure today is minimally elevated. He will continue his current medications, and he is encouraged to reduce his salt intake.

## 2012-09-03 ENCOUNTER — Other Ambulatory Visit: Payer: Self-pay | Admitting: Internal Medicine

## 2012-09-05 NOTE — Telephone Encounter (Signed)
Labs overdue, future orders placed

## 2012-09-21 ENCOUNTER — Other Ambulatory Visit: Payer: Self-pay | Admitting: Internal Medicine

## 2012-09-30 ENCOUNTER — Other Ambulatory Visit (INDEPENDENT_AMBULATORY_CARE_PROVIDER_SITE_OTHER): Payer: Managed Care, Other (non HMO)

## 2012-09-30 DIAGNOSIS — I428 Other cardiomyopathies: Secondary | ICD-10-CM

## 2012-09-30 DIAGNOSIS — T50995A Adverse effect of other drugs, medicaments and biological substances, initial encounter: Secondary | ICD-10-CM

## 2012-09-30 DIAGNOSIS — E785 Hyperlipidemia, unspecified: Secondary | ICD-10-CM

## 2012-09-30 LAB — HEPATIC FUNCTION PANEL
Albumin: 3.7 g/dL (ref 3.5–5.2)
Alkaline Phosphatase: 140 U/L — ABNORMAL HIGH (ref 39–117)

## 2012-09-30 LAB — BASIC METABOLIC PANEL
CO2: 28 mEq/L (ref 19–32)
Chloride: 106 mEq/L (ref 96–112)
Creatinine, Ser: 1 mg/dL (ref 0.4–1.5)

## 2012-09-30 LAB — CBC WITH DIFFERENTIAL/PLATELET
Basophils Relative: 0.2 % (ref 0.0–3.0)
Eosinophils Absolute: 0.1 10*3/uL (ref 0.0–0.7)
HCT: 37.9 % — ABNORMAL LOW (ref 39.0–52.0)
Hemoglobin: 12.2 g/dL — ABNORMAL LOW (ref 13.0–17.0)
Lymphs Abs: 2.5 10*3/uL (ref 0.7–4.0)
MCHC: 32.3 g/dL (ref 30.0–36.0)
MCV: 72.3 fl — ABNORMAL LOW (ref 78.0–100.0)
Monocytes Absolute: 0.6 10*3/uL (ref 0.1–1.0)
Neutro Abs: 5.8 10*3/uL (ref 1.4–7.7)
Neutrophils Relative %: 63.7 % (ref 43.0–77.0)
RBC: 5.24 Mil/uL (ref 4.22–5.81)

## 2012-10-04 ENCOUNTER — Encounter: Payer: Self-pay | Admitting: Internal Medicine

## 2012-10-04 ENCOUNTER — Telehealth: Payer: Self-pay | Admitting: Internal Medicine

## 2012-10-04 ENCOUNTER — Encounter (HOSPITAL_COMMUNITY): Payer: Self-pay | Admitting: Pharmacy Technician

## 2012-10-04 ENCOUNTER — Ambulatory Visit: Payer: Managed Care, Other (non HMO)

## 2012-10-04 DIAGNOSIS — R748 Abnormal levels of other serum enzymes: Secondary | ICD-10-CM | POA: Insufficient documentation

## 2012-10-04 NOTE — Telephone Encounter (Signed)
New Prob      Pt is having defib replaced on Friday and would like to know how long this procedure lasts and how long will be out of work. Would like to speak to nurse.

## 2012-10-04 NOTE — Telephone Encounter (Signed)
Discussed with Dr Ladona Ridgel may return to work on Mon 10/10/12, no lifting over 50 pounds for 6 weeks s/p implant.  I have tried to call patient but his voicemail will not let me leave a message

## 2012-10-06 MED ORDER — SODIUM CHLORIDE 0.9 % IR SOLN
80.0000 mg | Status: DC
  Filled 2012-10-06: qty 2

## 2012-10-06 MED ORDER — CEFAZOLIN SODIUM-DEXTROSE 2-3 GM-% IV SOLR
2.0000 g | INTRAVENOUS | Status: DC
  Filled 2012-10-06 (×2): qty 50

## 2012-10-07 ENCOUNTER — Encounter (HOSPITAL_COMMUNITY): Payer: Self-pay | Admitting: General Practice

## 2012-10-07 ENCOUNTER — Ambulatory Visit (HOSPITAL_COMMUNITY)
Admission: RE | Admit: 2012-10-07 | Discharge: 2012-10-08 | Disposition: A | Discharge status: Home / Self Care | Payer: Managed Care, Other (non HMO) | Source: Ambulatory Visit | Attending: Internal Medicine | Admitting: Internal Medicine

## 2012-10-07 ENCOUNTER — Encounter (HOSPITAL_COMMUNITY)
Admission: RE | Disposition: A | Discharge status: Home / Self Care | Payer: Self-pay | Source: Ambulatory Visit | Attending: Internal Medicine

## 2012-10-07 ENCOUNTER — Ambulatory Visit (HOSPITAL_COMMUNITY): Payer: Managed Care, Other (non HMO)

## 2012-10-07 DIAGNOSIS — E119 Type 2 diabetes mellitus without complications: Secondary | ICD-10-CM | POA: Insufficient documentation

## 2012-10-07 DIAGNOSIS — I10 Essential (primary) hypertension: Secondary | ICD-10-CM | POA: Insufficient documentation

## 2012-10-07 DIAGNOSIS — I472 Ventricular tachycardia, unspecified: Secondary | ICD-10-CM | POA: Insufficient documentation

## 2012-10-07 DIAGNOSIS — Z45018 Encounter for adjustment and management of other part of cardiac pacemaker: Secondary | ICD-10-CM

## 2012-10-07 DIAGNOSIS — Z4502 Encounter for adjustment and management of automatic implantable cardiac defibrillator: Secondary | ICD-10-CM | POA: Insufficient documentation

## 2012-10-07 DIAGNOSIS — I428 Other cardiomyopathies: Secondary | ICD-10-CM | POA: Insufficient documentation

## 2012-10-07 DIAGNOSIS — I5022 Chronic systolic (congestive) heart failure: Secondary | ICD-10-CM

## 2012-10-07 DIAGNOSIS — I4729 Other ventricular tachycardia: Secondary | ICD-10-CM | POA: Insufficient documentation

## 2012-10-07 DIAGNOSIS — K2270 Barrett's esophagus without dysplasia: Secondary | ICD-10-CM

## 2012-10-07 DIAGNOSIS — I509 Heart failure, unspecified: Secondary | ICD-10-CM | POA: Insufficient documentation

## 2012-10-07 DIAGNOSIS — Z79899 Other long term (current) drug therapy: Secondary | ICD-10-CM | POA: Insufficient documentation

## 2012-10-07 HISTORY — PX: IMPLANTABLE CARDIOVERTER DEFIBRILLATOR GENERATOR CHANGE: SHX5474

## 2012-10-07 LAB — GLUCOSE, CAPILLARY: Glucose-Capillary: 143 mg/dL — ABNORMAL HIGH (ref 70–99)

## 2012-10-07 LAB — SURGICAL PCR SCREEN
MRSA, PCR: NEGATIVE
Staphylococcus aureus: NEGATIVE

## 2012-10-07 SURGERY — IMPLANTABLE CARDIOVERTER DEFIBRILLATOR GENERATOR CHANGE
Anesthesia: LOCAL

## 2012-10-07 MED ORDER — LIDOCAINE HCL (PF) 1 % IJ SOLN
INTRAMUSCULAR | Status: AC
  Filled 2012-10-07: qty 60

## 2012-10-07 MED ORDER — METFORMIN HCL ER 500 MG PO TB24
1000.0000 mg | ORAL_TABLET | Freq: Every day | ORAL | Status: DC
  Administered 2012-10-07: 1000 mg via ORAL
  Filled 2012-10-07 (×2): qty 2

## 2012-10-07 MED ORDER — ATORVASTATIN CALCIUM 20 MG PO TABS
20.0000 mg | ORAL_TABLET | Freq: Every day | ORAL | Status: DC
  Administered 2012-10-08: 20 mg via ORAL
  Filled 2012-10-07 (×2): qty 1

## 2012-10-07 MED ORDER — SOTALOL HCL 120 MG PO TABS
120.0000 mg | ORAL_TABLET | Freq: Two times a day (BID) | ORAL | Status: DC
  Administered 2012-10-07 – 2012-10-08 (×2): 120 mg via ORAL
  Filled 2012-10-07 (×4): qty 1

## 2012-10-07 MED ORDER — MIDAZOLAM HCL 5 MG/5ML IJ SOLN
INTRAMUSCULAR | Status: AC
  Filled 2012-10-07: qty 5

## 2012-10-07 MED ORDER — HYDROCODONE-ACETAMINOPHEN 5-325 MG PO TABS
1.0000 | ORAL_TABLET | Freq: Four times a day (QID) | ORAL | Status: AC | PRN
  Administered 2012-10-07: 1 via ORAL
  Administered 2012-10-07: 2 via ORAL
  Administered 2012-10-08: 1 via ORAL
  Filled 2012-10-07 (×3): qty 1
  Filled 2012-10-07: qty 2

## 2012-10-07 MED ORDER — SODIUM CHLORIDE 0.9 % IV SOLN
INTRAVENOUS | Status: DC
  Administered 2012-10-07: 1000 mL via INTRAVENOUS

## 2012-10-07 MED ORDER — FENTANYL CITRATE 0.05 MG/ML IJ SOLN
INTRAMUSCULAR | Status: AC
  Filled 2012-10-07: qty 2

## 2012-10-07 MED ORDER — ONDANSETRON HCL 4 MG/2ML IJ SOLN: 4.0000 mg | Freq: Four times a day (QID) | INTRAMUSCULAR | Status: DC | PRN

## 2012-10-07 MED ORDER — CEFAZOLIN SODIUM-DEXTROSE 2-3 GM-% IV SOLR
2.0000 g | Freq: Four times a day (QID) | INTRAVENOUS | Status: AC
  Administered 2012-10-07 – 2012-10-08 (×3): 2 g via INTRAVENOUS
  Filled 2012-10-07 (×5): qty 50

## 2012-10-07 MED ORDER — MUPIROCIN 2 % EX OINT
TOPICAL_OINTMENT | CUTANEOUS | Status: AC
  Filled 2012-10-07: qty 22

## 2012-10-07 MED ORDER — RAMIPRIL 10 MG PO CAPS
10.0000 mg | ORAL_CAPSULE | Freq: Every day | ORAL | Status: DC
  Administered 2012-10-08: 10 mg via ORAL
  Filled 2012-10-07 (×2): qty 1

## 2012-10-07 MED ORDER — PANTOPRAZOLE SODIUM 40 MG PO TBEC
40.0000 mg | DELAYED_RELEASE_TABLET | Freq: Every day | ORAL | Status: DC
  Administered 2012-10-08: 40 mg via ORAL
  Filled 2012-10-07: qty 1

## 2012-10-07 MED ORDER — LINAGLIPTIN 5 MG PO TABS
5.0000 mg | ORAL_TABLET | Freq: Every day | ORAL | Status: DC
  Administered 2012-10-07: 5 mg via ORAL
  Filled 2012-10-07 (×2): qty 1

## 2012-10-07 MED ORDER — CARVEDILOL 6.25 MG PO TABS
18.7500 mg | ORAL_TABLET | Freq: Two times a day (BID) | ORAL | Status: DC
  Administered 2012-10-07 – 2012-10-08 (×2): 18.75 mg via ORAL
  Filled 2012-10-07 (×4): qty 1

## 2012-10-07 MED ORDER — MUPIROCIN 2 % EX OINT
TOPICAL_OINTMENT | Freq: Two times a day (BID) | CUTANEOUS | Status: DC
  Administered 2012-10-07: 06:00:00 via NASAL

## 2012-10-07 MED ORDER — ASPIRIN EC 81 MG PO TBEC
81.0000 mg | DELAYED_RELEASE_TABLET | Freq: Every day | ORAL | Status: DC
  Administered 2012-10-08: 81 mg via ORAL
  Filled 2012-10-07 (×2): qty 1

## 2012-10-07 MED ORDER — ACETAMINOPHEN 325 MG PO TABS
325.0000 mg | ORAL_TABLET | ORAL | Status: DC | PRN
  Administered 2012-10-08: 650 mg via ORAL
  Filled 2012-10-07: qty 2

## 2012-10-07 MED ORDER — CHLORHEXIDINE GLUCONATE 4 % EX LIQD: 60.0000 mL | Freq: Once | CUTANEOUS | Status: DC

## 2012-10-07 MED ORDER — DIGOXIN 125 MCG PO TABS
0.1250 mg | ORAL_TABLET | Freq: Every day | ORAL | Status: DC
  Administered 2012-10-08: 0.125 mg via ORAL
  Filled 2012-10-07 (×2): qty 1

## 2012-10-07 MED ORDER — SITAGLIPTIN PHOS-METFORMIN HCL 50-1000 MG PO TABS: 1.0000 | ORAL_TABLET | Freq: Every day | ORAL | Status: DC

## 2012-10-07 NOTE — Progress Notes (Signed)
Orthopedic Tech Progress Note Patient Details:  [name] August 12, 1950 [id number]  Patient already has arm sling. No actin needed at this time from [facility].  Patient ID: [name], male   DOB: February 20, 1951, 62 y.o.   MRN: [id number]   [name] 10/07/2012, 12:14 PM

## 2012-10-07 NOTE — Op Note (Signed)
ICD lead revision with insertion of a new ICD lead and new ICD with removal of the old device which was at [facility] with DFT testing without immediate complication. [id number].

## 2012-10-07 NOTE — H&P (Signed)
HPI [name] returns today for ICD removal, insertion of a new ICD lead and insertion of a new ICD. He is a very pleasant middle-age man with a nonischemic cardiomyopathy, chronic systolic heart failure, ventricular tachycardia, status post ICD implantation. He has reached elective replacement on his ICD. He has a Medtronic [id number] defibrillation lead. He denies chest pain or shortness of breath. No peripheral edema. No syncope or recurrent ICD shocks. No Known Allergies      Current Outpatient Prescriptions   Medication  Sig  Dispense  Refill   .  atorvastatin (LIPITOR) 20 MG tablet  Take 1 tablet (20 mg total) by mouth daily. 1 by mouth daily, LABS OVERDUE   30 tablet   0   .  carvedilol (COREG) 12.5 MG tablet  TAKE 1 & 1/2 TABLETS TWICE A DAY WITH MEALS   270 tablet   1   .  digoxin (LANOXIN) 0.125 MG tablet  TAKE 1 TABLET (125 MCG TOTAL) BY MOUTH DAILY.   90 tablet   2   .  HYDROcodone-homatropine (HYDROMET) 5-1.5 MG/5ML syrup  Take 5 mLs by mouth every 6 (six) hours as needed for cough.   120 mL   0   .  omeprazole (PRILOSEC) 20 MG capsule  Take 1 capsule (20 mg total) by mouth daily. 30 minutes before breakfast   90 capsule   4   .  ramipril (ALTACE) 10 MG capsule  1 by mouth twice daily   180 capsule   1   .  sitaGLIPtan-metformin (JANUMET) 50-1000 MG per tablet  1 qd with largest meal   90 tablet   1   .  sotalol (BETAPACE) 120 MG tablet  Take 1 tablet (120 mg total) by mouth 2 (two) times daily.   180 tablet   3   .  urea (CARMOL) 40 % CREA  As dircted             No current facility-administered medications for this visit.           Past Medical History   Diagnosis  Date   .  Ventricular tachycardia         s/p AICD;  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 and placed on sotalol   .  Hypertension     .  Diabetes mellitus     .  NICM (nonischemic cardiomyopathy)         normal cors by cath in 2005   .  Prostate cancer         s/p prostatectomy   .  Sleep apnea     .  Hyperlipidemia     .   Chronic systolic heart failure     .  Anxiety         since defibulator placement   .  Arthritis         knees   .  Cataract     .  GERD (gastroesophageal reflux disease)          ROS:    All systems reviewed and negative except as noted in the HPI.      Past Surgical History   Procedure  Laterality  Date   .  Cardiac defibrillator placement    03/11/2004       Medtronic Maximo single lead   .  Prostatectomy       .  Rectal surgery       .  Upper gastrointestinal endoscopy    08/2004, 02/24/11  Barrett's esophagus   .  Colonoscopy w/ biopsies and polypectomy    08/2004, 02/24/11       6mm adenoma in 2006,  5 mm polyp (not recovered) 2012           Family History   Problem  Relation  Age of Onset   .  Coronary artery disease  Father     .  Hypertension  Mother     .  Prostate cancer  Father     .  Coronary artery disease       .  Diabetes               History       Social History   .  Marital Status:  Married       Spouse Name:  N/A       Number of Children:  2   .  Years of Education:  N/A       Occupational History   .  Surveyor, minerals       Social History Main Topics   .  Smoking status:  Never Smoker    .  Smokeless tobacco:  Never Used   .  Alcohol Use:  No   .  Drug Use:  No   .  Sexually Active:  Not on file       Other Topics  Concern   .  Not on file       Social History Narrative   .  No narrative on file          BP 147/79  Pulse 79  Ht 5\' 11"  (1.803 m)  Wt 242 lb 3.2 oz (109.861 kg)  BMI 33.79 kg/m2   Physical Exam:   Well appearing middle-aged man,NAD HEENT: Unremarkable Neck:  No JVD, no thyromegally Back:  No CVA tenderness Lungs:  Clear with no wheezes, rales, or rhonchi. HEART:  Regular rate rhythm, no murmurs, no rubs, no clicks Abd:  soft, positive bowel sounds, no organomegally, no rebound, no guarding Ext:  2 plus pulses, no edema, no cyanosis, no clubbing Skin:  No rashes no  nodules Neuro:  CN II through XII intact, motor grossly intact   DEVICE   Normal device function.  See PaceArt for details.    Assess/Plan:         1.  AUTOMATIC IMPLANTABLE CARDIAC DEFIBRILLATOR SITU    Status: Written Related Problem: AUTOMATIC IMPLANTABLE CARDIAC DEFIBRILLATOR SITU           His device has reached elective replacement. He will undergo generator change. With his Medtronic 6949-lead, he will undergo lead revision as well.         2. Chronic systolic heart failure -    Status: Written Related Problem: Chronic systolic heart failure           His current symptoms are class 1-2. He will continue his current medical therapy, and maintain a low-sodium diet.         3.  HYPERTENSION -     Status: Written Related Problem: HYPERTENSION           His blood pressure today is minimally elevated. He will continue his current medications, and he is encouraged to reduce his salt intake.         4.  Ventricular tachycardia -   Status: Written Related Problem: Ventricular tachycardia           He has no sustained ventricular arrhythmias.  He will continue his current dose of sotalol.     Leonia Reeves.D.

## 2012-10-07 NOTE — Op Note (Signed)
NAMEESMERALDA, Hernandez NO.:  [id number]  MEDICAL RECORD NO.:  [id number]  LOCATION:  [facility]                         FACILITY:  [facility]  PHYSICIAN:  Doylene Canning. Jesse Ridgel, MD    DATE OF BIRTH:  13-Aug-1950  DATE OF PROCEDURE:  10/07/2012 DATE OF DISCHARGE:                              OPERATIVE REPORT   PROCEDURE PERFORMED:  Insertion of a new Medtronic ICD lead with removal of the old ICD which had reached elective replacement and insertion of a new Medtronic ICD with defibrillation threshold testing.  INTRODUCTION:  The patient is a 62 year old male with a nonischemic cardiomyopathy, chronic systolic heart failure, and ventricular tachycardia, which has been well controlled on sotalol therapy.  He has reached elective replacement on his ICD.  He has a Medtronic [phone] lead and because of its propensity to fail will be admitted for removal of his old lead rather it will be removal for capping of his old lead insertion of a new lead and removal of old ICD and insertion of a new ICD with defibrillation threshold testing.  PROCEDURE:  After informed consent was obtained, the patient was taken to the diagnostic EP lab in a fasting state.  After usual preparation and draping, intravenous fentanyl and midazolam were given for sedation. A 30 mL of lidocaine was infiltrated into the left infraclavicular region and a 7-cm incision was carried out over this region. Electrocautery was utilized to dissect down to the fascial plane. Initial attempts to puncture the subclavian vein were unsuccessful.  The cephalic vein was dissected free and the Glidewire was advanced by way of the cephalic vein into the central circulation.  I suspect that the subclavian vein was subtotally occluded because it was fairly difficult for the Glidewire to get across the subclavian vein into the superior vena cava.  This was ultimately accomplished with success and the Medtronic model 6935 62-cm active  fixation defibrillation lead was advanced by way of a 9-French peel-away sheath into the right ventricle. Mapping was difficult.  There were R-waves along the RV septum that were initially quite small and we had difficulty getting the lead out to the apical septum.  After much difficulty, we eventually got the lead into the apical septum and ultimately the R-waves measured 8 mV.  The pacing impedance with lead actively fixed was 600 ohms and the threshold was a V at 0.4 milliseconds.  10 V pacing did not stimulate the diaphragm and with active fixation lead, there was a large injury current.  With these satisfactory parameters, the lead was secured to the subpectoral fascia with a figure-of-eight silk suture and the sewing sleeve was secured with silk suture.  Electrocautery cautery was then utilized to enter the ICD pocket and the generator was removed with gentle traction.  The old [phone] lead was disconnected from the old ICD and the old lead was capped. The new Medtronic Evera XT VR single-chamber defibrillator, serial number [id number] was then connected to the defibrillation lead and the new defibrillation lead placed back in the subcutaneous pocket.  The new defibrillation lead had a serial number [id number].  The pocket was irrigated with antibiotic irrigation.  Electrocautery was  utilized to assure hemostasis.  The incision was then closed with 2-0 and 3-0 Vicryl.  A pressure dressing was applied, and benzoin and Steri-Strips were painted on the skin.  I scrubbed out of the case to supervise defibrillation threshold testing.  As the patient was more deeply sedated under my direct supervision with intravenous fentanyl and Versed, VF was induced with T-wave shock.  A 20- joule shock was then delivered which terminated ventricular fibrillation and restored sinus rhythm.  There was appropriate sensing.  The shock impedance was 61 ohms.  At this point, the patient was returned to  his room in satisfactory condition.  COMPLICATIONS:  There were no immediate procedure complications.  RESULTS:  Demonstrate successful removal of his previously implanted Medtronic single-chamber defibrillator, followed by capping of the old ICD which was a Medtronic [phone] lead followed by insertion of a new Medtronic ICD lead and a new Medtronic ICD defibrillator with defibrillation threshold testing.     Doylene Canning. Jesse Ridgel, MD     GWT/MEDQ  D:  10/07/2012  T:  10/07/2012  Job:  [id number]

## 2012-10-07 NOTE — Progress Notes (Signed)
Utilization review completed.  P.J. Shiloh Southern,RN,BSN Case Manager [phone]  

## 2012-10-08 ENCOUNTER — Encounter (HOSPITAL_COMMUNITY): Payer: Self-pay | Admitting: Physician Assistant

## 2012-10-08 ENCOUNTER — Ambulatory Visit (HOSPITAL_COMMUNITY): Payer: Managed Care, Other (non HMO)

## 2012-10-08 DIAGNOSIS — I428 Other cardiomyopathies: Secondary | ICD-10-CM

## 2012-10-08 LAB — GLUCOSE, CAPILLARY: Glucose-Capillary: 151 mg/dL — ABNORMAL HIGH (ref 70–99)

## 2012-10-08 NOTE — Discharge Summary (Signed)
Discharge Summary   Patient ID: [name] MRN: [id number], DOB/AGE: [birth year] 62 y.o. Admit date: 10/07/2012 D/C date:     10/08/2012  Primary Cardiologist: Ladona Ridgel  Primary Discharge Diagnoses:  1. NICM/chronic systolic CHF/VT - s/p insertion of new Medtronic ICD lead and generator change of ICD 10/07/12  Secondary Discharge Diagnoses:  Past Medical History  Diagnosis Date  . Ventricular tachycardia     a. s/p ICD (generator change 09/2012). b. 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 and placed on sotalol  . Hypertension   . Diabetes mellitus   . NICM (nonischemic cardiomyopathy)     a.normal cors by cath in 2005. b. s/p Medtronic ICD.  [name] Prostate cancer     s/p prostatectomy  . Sleep apnea   . Hyperlipidemia   . Chronic systolic heart failure   . Anxiety     since defibrillator placement  . Arthritis     knees  . Cataract   . GERD (gastroesophageal reflux disease)    Hospital Course: [name] is a 62 y/o M with history of nonischemic cardiomyopathy, chronic systolic heart failure, ventricular tachycardia, status post prior ICD implantation who presented for generator changeout. He had reached elective replacement on his ICD. He also had a Medtronic [id number] defibrillation lead. He denied any CP, SOB, edema, syncope or recurrent ICD shocks. He was admitted for insertion of a new Medtronic ICD lead with removal of the old ICD and insertion of a new Medtronic ICD with defibrillation threshold testing. He had no complications post-procedurally. Dr. Elease Hashimoto has seen and examined him and feels he is stable for discharge. I have left a message on our office's scheduling voicemail requesting a follow-up appointment for 3 months and wound check 10 days, and our office will call the patient with this appointment.  Discharge Vitals: Blood pressure 113/68, pulse 70, temperature 98.3 F (36.8 C), temperature source Oral, resp. rate 20, height 5\' 11"  (1.803 m), weight 241 lb 14.4 oz (109.725 kg), SpO2  96.00%.  Labs: no labs this admission  Diagnostic Studies/Procedures   ICD revision as above  Dg Chest 2 View 10/08/2012   *RADIOLOGY REPORT*  Clinical Data:   AICD revision  CHEST - 2 VIEW  Comparison:   the previous day's study  Findings: Interval revision of a left subclavian AICD with a new lead extending towards the right ventricular apex.  No pneumothorax.  Lungs clear.  Heart size upper limits normal.  No effusion.  Minimal spurring in the thoracic spine.  IMPRESSION:  1.  Left subclavian AICD revision without pneumothorax or other apparent complication.   Original Report Authenticated By: D. Andria Rhein, MD   Dg Chest 2 View  10/07/2012   *RADIOLOGY REPORT*  Clinical Data: Preop defibrillator change  CHEST - 2 VIEW  Comparison: 12/31/2010  Findings: There is a left chest wall AICD with lead in the right ventricle. The heart size appears normal.  There is no pleural effusion or edema.  No airspace consolidation noted. Spondylosis noted within the thoracic spine.  IMPRESSION:  1.  No acute cardiopulmonary abnormalities.   Original Report Authenticated By: Signa Kell, M.D.    Discharge Medications     Medication List    TAKE these medications       aspirin EC 81 MG tablet  Take 81 mg by mouth daily.     atorvastatin 20 MG tablet  Commonly known as:  LIPITOR  Take 20 mg by mouth daily.     carvedilol 12.5 MG tablet  Commonly known as:  COREG  Take 18.75 mg by mouth 2 (two) times daily with a meal.     digoxin 0.125 MG tablet  Commonly known as:  LANOXIN  Take 0.125 mg by mouth daily.     NASCOBAL 500 MCG/0.1ML Soln  Generic drug:  Cyanocobalamin  Place 500 mcg into the nose every 30 (thirty) days.     omeprazole 20 MG capsule  Commonly known as:  PRILOSEC  Take 1 capsule (20 mg total) by mouth daily. 30 minutes before breakfast     ramipril 10 MG capsule  Commonly known as:  ALTACE  Take 10 mg by mouth daily.     sitaGLIPtan-metformin 50-1000 MG per tablet   Commonly known as:  JANUMET  Take 1 tablet by mouth daily with supper.     sotalol 120 MG tablet  Commonly known as:  BETAPACE  Take 1 tablet (120 mg total) by mouth 2 (two) times daily.     urea 40 % Crea  Commonly known as:  CARMOL  Apply 1 application topically 2 (two) times daily. To feet        Disposition   The patient will be discharged in stable condition to home. Discharge Orders   Future Orders Complete By Expires     Diet - low sodium heart healthy  As directed     Discharge instructions  As directed     Comments:      Please see attached sheet at the end of your After-Visit Summary for instructions on wound care, activity, and bathing.    Increase activity slowly  As directed       Follow-up Information   Follow up with [facility]. (Our office will call you for a wound check in 10 days and followup appointment in 3 months.)    Contact information:   [address] [place] [address] [phone]        Duration of Discharge Encounter: Greater than 30 minutes including physician and PA time.  Signed, Ronie Spies PA-C 10/08/2012, 11:17 AM

## 2012-10-08 NOTE — Progress Notes (Signed)
   PROGRESS NOTE  Subjective:   Mr. Bagnall was admitted for ICD revision.  He tolerated the procedure well.  CXR looks ok   Objective:    Vital Signs:   Temp:  [98.1 F (36.7 C)-98.5 F (36.9 C)] 98.3 F (36.8 C) (05/31 0558) Pulse Rate:  [60-77] 77 (05/31 0558) Resp:  [17-20] 20 (05/31 0558) BP: (124-189)/(67-99) 155/87 mmHg (05/31 0652) SpO2:  [94 %-98 %] 96 % (05/31 0558) Weight:  [241 lb 14.4 oz (109.725 kg)] 241 lb 14.4 oz (109.725 kg) (05/31 0558)  Last BM Date: 10/06/12   24-hour weight change: Weight change: -1.6 oz (-0.045 kg)  Weight trends: Filed Weights   10/07/12 0621 10/08/12 0558  Weight: 242 lb (109.77 kg) 241 lb 14.4 oz (109.725 kg)    Intake/Output:  05/30 0701 - 05/31 0700 In: -  Out: 900 [Urine:900]     Physical Exam: BP 155/87  Pulse 77  Temp(Src) 98.3 F (36.8 C) (Oral)  Resp 20  Ht 5\' 11"  (1.803 m)  Wt 241 lb 14.4 oz (109.725 kg)  BMI 33.75 kg/m2  SpO2 96%  General: Vital signs reviewed and noted.   Head: Normocephalic, atraumatic.  Eyes: conjunctivae/corneas clear.  EOM's intact.   Throat: normal  Neck:  normal  Lungs:   clear, pacer area is slightly tender  Heart:  RR, normal S1, S2  Abdomen:  Soft, non-tender, non-distended    Extremities: No edema.     Neurologic: A&O X3, CN II - XII are grossly intact.   Psych: Normal     Labs: BMET: No results found for this basename: NA, K, CL, CO2, GLUCOSE, BUN, CREATININE, CALCIUM, MG, PHOS,  in the last 72 hours  Liver function tests: No results found for this basename: AST, ALT, ALKPHOS, BILITOT, PROT, ALBUMIN,  in the last 72 hours No results found for this basename: LIPASE, AMYLASE,  in the last 72 hours  CBC: No results found for this basename: WBC, NEUTROABS, HGB, HCT, MCV, PLT,  in the last 72 hours  Cardiac Enzymes: No results found for this basename: CKTOTAL, CKMB, TROPONINI,  in the last 72 hours  Coagulation Studies: No results found for this basename: LABPROT, INR,   in the last 72 hours   Other results:  Tele:  NSR  Medications:    Infusions:    Scheduled Medications: . aspirin EC  81 mg Oral Daily  . atorvastatin  20 mg Oral Daily  . carvedilol  18.75 mg Oral BID WC  . digoxin  0.125 mg Oral Daily  . linagliptin  5 mg Oral Q supper   And  . metFORMIN  1,000 mg Oral Q supper  . pantoprazole  40 mg Oral QAC breakfast  . ramipril  10 mg Oral Daily  . sotalol  120 mg Oral BID    Assessment/ Plan:    1. ICD:  S/p revision, no complications.  CXR looks ok.  Will DC to home today.  He should follow up with Dr. Ladona Ridgel in ~ 2 weeks.    Disposition: DC to home. Length of Stay: 1  Vesta Mixer, Montez Hageman., MD, [facility] 10/08/2012, 7:56 AM Office [phone] Pager [phone]

## 2012-10-10 ENCOUNTER — Other Ambulatory Visit (INDEPENDENT_AMBULATORY_CARE_PROVIDER_SITE_OTHER): Payer: Managed Care, Other (non HMO)

## 2012-10-10 DIAGNOSIS — R748 Abnormal levels of other serum enzymes: Secondary | ICD-10-CM

## 2012-10-10 LAB — GAMMA GT: GGT: 22 U/L (ref 7–51)

## 2012-10-10 NOTE — Telephone Encounter (Signed)
Pt states that since his defibrillator placement on 10/07/12, he has been experiencing sharp chest pains intermittently (10-15 times per day) lasting up to three to four minutes at a time. No associated SOB, tingling or referred pain or other noted symptoms except he reports he is having trouble sleeping. Requesting possible sleep aid recommendation/Rx, in addition to, reporting the sharp pains. Discussed healing process after Defibrillator Placement but informed pt that MD would be notified of both requests.

## 2012-10-10 NOTE — Telephone Encounter (Signed)
New Problem  Pt states he is having problems sleeping and every now and then he is getting a sharp pain in chest.  He asked if you could call him back

## 2012-10-11 NOTE — Telephone Encounter (Signed)
Discussed with Dr Ladona Ridgel will need to contact is PCP about the sleep issue and the sharp pains will continue to get better as he heals from gen change.  I tried to call patient but his voicemail is full

## 2012-10-14 ENCOUNTER — Other Ambulatory Visit: Payer: Self-pay | Admitting: Internal Medicine

## 2012-10-17 ENCOUNTER — Ambulatory Visit (INDEPENDENT_AMBULATORY_CARE_PROVIDER_SITE_OTHER): Payer: Managed Care, Other (non HMO) | Admitting: *Deleted

## 2012-10-17 ENCOUNTER — Encounter: Payer: Self-pay | Admitting: Internal Medicine

## 2012-10-17 ENCOUNTER — Encounter: Payer: Self-pay | Admitting: *Deleted

## 2012-10-17 DIAGNOSIS — I472 Ventricular tachycardia: Secondary | ICD-10-CM

## 2012-10-17 DIAGNOSIS — I5022 Chronic systolic (congestive) heart failure: Secondary | ICD-10-CM

## 2012-10-17 LAB — ICD DEVICE OBSERVATION
TZON-0003FASTVT: 289.8 ms
TZON-0003SLOWVT: 370.3 ms

## 2012-10-17 NOTE — Progress Notes (Signed)
Wound check-ICD in office. 

## 2012-10-20 ENCOUNTER — Ambulatory Visit (INDEPENDENT_AMBULATORY_CARE_PROVIDER_SITE_OTHER): Payer: Managed Care, Other (non HMO) | Admitting: *Deleted

## 2012-10-20 ENCOUNTER — Encounter: Payer: Self-pay | Admitting: *Deleted

## 2012-10-20 DIAGNOSIS — I472 Ventricular tachycardia, unspecified: Secondary | ICD-10-CM

## 2012-10-20 DIAGNOSIS — I5022 Chronic systolic (congestive) heart failure: Secondary | ICD-10-CM

## 2012-10-20 DIAGNOSIS — I428 Other cardiomyopathies: Secondary | ICD-10-CM

## 2012-10-20 LAB — ICD DEVICE OBSERVATION: DEV-0020ICD: NEGATIVE

## 2012-10-20 NOTE — Progress Notes (Signed)
Wound recheck in office.

## 2012-11-01 ENCOUNTER — Encounter: Payer: Self-pay | Admitting: *Deleted

## 2012-11-01 ENCOUNTER — Ambulatory Visit: Payer: Managed Care, Other (non HMO) | Admitting: *Deleted

## 2012-11-01 DIAGNOSIS — I428 Other cardiomyopathies: Secondary | ICD-10-CM

## 2012-11-01 LAB — ICD DEVICE OBSERVATION

## 2012-11-01 NOTE — Progress Notes (Signed)
Pt seen for wound recheck for defib gen change.  Wound progressing better--- swelling decreased. Pt denies shortness of breath, palpitations, or dizziness. Pt to follow up with Dr. Ladona Ridgel in 3 months.   [name] 11/01/2012 4:50 PM

## 2012-11-07 ENCOUNTER — Telehealth: Payer: Self-pay | Admitting: Internal Medicine

## 2012-11-07 NOTE — Telephone Encounter (Signed)
Have forms filled out, Dr Ladona Ridgel needs to sign

## 2012-11-07 NOTE — Telephone Encounter (Signed)
New problem  Pt is calling regarding some short term disability forms that he had faxed over a few weeks ago.

## 2012-11-08 ENCOUNTER — Telehealth: Payer: Self-pay | Admitting: Internal Medicine

## 2012-11-08 ENCOUNTER — Encounter: Payer: Self-pay | Admitting: *Deleted

## 2012-11-08 NOTE — Telephone Encounter (Signed)
Patient is calling concerned about swelling in his legs and ankles. He is asking to speak with a nurse before making an appointment.

## 2012-11-08 NOTE — Telephone Encounter (Signed)
Paper work is complete, will fax today

## 2012-11-08 NOTE — Telephone Encounter (Signed)
New Prob     Pt has some questions regarding some paperwork for his insurance company. Pt also states he is experiencing some swelling and would like to speak to nurse.

## 2012-11-08 NOTE — Telephone Encounter (Signed)
This encounter was created in error - please disregard.

## 2012-11-08 NOTE — Telephone Encounter (Signed)
Tried to call Pt VM not set up unable to leave VM.

## 2012-11-09 ENCOUNTER — Telehealth: Payer: Self-pay | Admitting: Internal Medicine

## 2012-11-09 NOTE — Telephone Encounter (Signed)
If he is also having chest pain, palpitations, shortness of breath; ER visit would be indicated rather than waiting for the scheduled office visit.

## 2012-11-09 NOTE — Telephone Encounter (Signed)
Pt c/o swelling in arm, leg and ankles x3days. Pt denies any Chest pain, SOB, dizziness, headache, redness, knots/lumps in legs or arms, warmness to touch, pain or tenderness to touch. Pt schedule for OV tomorrow at 1:15.Marland KitchenMarland KitchenPlease advise if Pt need evaluation sooner

## 2012-11-09 NOTE — Telephone Encounter (Signed)
[employer] papers signed by Wilburn Mylar to [employer] at [phone] scanned In  [employer] mailed To Pt Home Address 11/09/12/KM

## 2012-11-10 ENCOUNTER — Ambulatory Visit (HOSPITAL_COMMUNITY)
Admission: RE | Admit: 2012-11-10 | Discharge: 2012-11-10 | Disposition: A | Discharge status: Home / Self Care | Payer: Managed Care, Other (non HMO) | Source: Ambulatory Visit | Attending: Internal Medicine | Admitting: Internal Medicine

## 2012-11-10 ENCOUNTER — Encounter: Payer: Self-pay | Admitting: Internal Medicine

## 2012-11-10 ENCOUNTER — Other Ambulatory Visit: Payer: Self-pay | Admitting: Internal Medicine

## 2012-11-10 ENCOUNTER — Ambulatory Visit (INDEPENDENT_AMBULATORY_CARE_PROVIDER_SITE_OTHER): Payer: Managed Care, Other (non HMO) | Admitting: Internal Medicine

## 2012-11-10 ENCOUNTER — Telehealth: Payer: Self-pay

## 2012-11-10 VITALS — BP 140/86 | HR 81 | Temp 98.1°F | Wt 249.0 lb

## 2012-11-10 DIAGNOSIS — G4733 Obstructive sleep apnea (adult) (pediatric): Secondary | ICD-10-CM | POA: Insufficient documentation

## 2012-11-10 DIAGNOSIS — M7989 Other specified soft tissue disorders: Secondary | ICD-10-CM

## 2012-11-10 DIAGNOSIS — R609 Edema, unspecified: Secondary | ICD-10-CM

## 2012-11-10 DIAGNOSIS — R600 Localized edema: Secondary | ICD-10-CM

## 2012-11-10 DIAGNOSIS — E119 Type 2 diabetes mellitus without complications: Secondary | ICD-10-CM

## 2012-11-10 DIAGNOSIS — M25473 Effusion, unspecified ankle: Secondary | ICD-10-CM

## 2012-11-10 DIAGNOSIS — M79661 Pain in right lower leg: Secondary | ICD-10-CM

## 2012-11-10 DIAGNOSIS — M79609 Pain in unspecified limb: Secondary | ICD-10-CM

## 2012-11-10 DIAGNOSIS — R7989 Other specified abnormal findings of blood chemistry: Secondary | ICD-10-CM

## 2012-11-10 HISTORY — DX: Obstructive sleep apnea (adult) (pediatric): G47.33

## 2012-11-10 LAB — BASIC METABOLIC PANEL
CO2: 29 mEq/L (ref 19–32)
Chloride: 103 mEq/L (ref 96–112)
Creat: 1.07 mg/dL (ref 0.50–1.35)
Potassium: 3.9 mEq/L (ref 3.5–5.3)
Sodium: 137 mEq/L (ref 135–145)

## 2012-11-10 LAB — AST: AST: 15 U/L (ref 0–37)

## 2012-11-10 LAB — TSH: TSH: 1.822 u[IU]/mL (ref 0.350–4.500)

## 2012-11-10 LAB — D-DIMER, QUANTITATIVE: D-Dimer, Quant: 0.74 ug/mL-FEU — ABNORMAL HIGH (ref 0.00–0.48)

## 2012-11-10 NOTE — Progress Notes (Signed)
  Subjective:    Patient ID: [name], male    DOB: May 02, 1951, 62 y.o.   MRN: [id number]  HPI Edema Onset:11/05/12 in RLE & 6/30 in L arm Location:L arm ;R calf &  R ankle Course: stable Trigger/exacerbating factors: He had minor trauma w/o sequellae to R foot 6/27; swelling began 6/28No excess intake of salt, prolonged standing or travel, or intake of medications associated with edema (amlodipine, steroids) Treatment/response:elevation/ rest with improvement  Labs done on 09/30/12 were reviewed. Creatinine was 1.0; AST 25; and ALT 29. His most recent A1c was 7.1% in October 2013  Past medical history: Extensive cardiopulmonary,vascular history as recorded. S/P defibrillator replacement 5/30 Family history: CHF, HTN , DM    Review of Systems Constitutional: no fatigue. Not using CPAP for sleep  apnea                                                       Cardiovascular: Intermittent palpitations. No claudication; paroxysmal nocturnal dyspnea Pulmonary:Am cough with  sputum production. No exertional dyspnea Endocrinologic:5# weight gain. No temperature intolerance; skin/hair/nail changes Vascular: He describes throbbing right medial calf pain at night.      Objective:   Physical Exam Appears  well-nourished & in no acute distress  No carotid bruits are present.No neck pain distention present at 10 - 15 degrees. Thyroid normal to palpation  Heart rhythm and rate are slow & irregular with no significant murmurs or gallops.  Chest is clear with no increased work of breathing  There is no evidence of aortic aneurysm or renal artery bruits  Abdomen soft with no organomegaly or masses. No HJR  No clubbing or cyanosis . RLE 1/2+ edema present. Homan's negative. There is asymmetric fullness of the left forearm proximally compared to the right. Left forearm 13 inches in circumference; right 12 inches at a 0.6 cm below the antecubital crease. There is no tenderness to compression in  this area. He is right-handed.  Pedal pulses are intact   No ischemic skin changes are present . Nails healthy    Alert and oriented. Strength, tone, DTRs reflexes normal          Assessment & Plan:   #1 mild pedal edema right lower extremity  #2 forearm asymmetry, left greater than right   #3 sleep apnea, untreated  #4 diabetes question status  Plan see orders and recommendations . If hepatorenal function studies are negative ; Reassessment of his sleep apnea status is indicated. Doppler of the left upper extremity may also be necessary to assess the asymmetry optimally

## 2012-11-10 NOTE — Progress Notes (Signed)
Called by vascular lab regarding the orders for the venous doppler ultrasound. The order in the system was for radiology but it needs to be entered for vascular. The vascular technician was not sure regarding the localization of the study - per pt, his right leg and left arm are swollen and his understanding was that he was to have these imaged. Checked chart and the ultrasound was entered for lower extremities. I reviewed Dr. Frederik Pear note from today, that mentions: Edema  Onset:11/05/12 in RLE & 6/30 in L arm  Location:L arm ;R calf & R ankle Based on this, I re-ordered the U/S Doppler to be done by [facility] ancillary personnel, for R leg and L arm.

## 2012-11-10 NOTE — Patient Instructions (Addendum)
Please verify Sleep Apnea MD.

## 2012-11-10 NOTE — Addendum Note (Signed)
Addended by: Silvio Pate D on: 11/10/2012 02:50 PM   Modules accepted: Orders

## 2012-11-10 NOTE — Telephone Encounter (Signed)
Spoke with patient, D-Dimer (screening test elevated), patient to go to [facility] for Venous U/S per Dr.Hopper, patient verbalized understanding.

## 2012-11-11 ENCOUNTER — Other Ambulatory Visit: Payer: Self-pay | Admitting: Internal Medicine

## 2012-11-11 LAB — MICROALBUMIN / CREATININE URINE RATIO
Creatinine, Urine: 202.5 mg/dL
Microalb, Ur: 1.22 mg/dL (ref 0.00–1.89)

## 2012-11-11 LAB — HEMOGLOBIN A1C: Hgb A1c MFr Bld: 7.1 % — ABNORMAL HIGH (ref <5.7)

## 2012-11-14 ENCOUNTER — Encounter: Payer: Self-pay | Admitting: *Deleted

## 2012-11-15 ENCOUNTER — Encounter: Payer: Self-pay | Admitting: Pediatrics

## 2012-11-25 ENCOUNTER — Telehealth: Payer: Self-pay | Admitting: Internal Medicine

## 2012-11-25 MED ORDER — ATORVASTATIN CALCIUM 20 MG PO TABS: 20.0000 mg | ORAL_TABLET | Freq: Every day | ORAL | Status: DC

## 2012-11-25 NOTE — Telephone Encounter (Signed)
Rx sent 

## 2012-11-25 NOTE — Telephone Encounter (Signed)
[facility] is calling about the patient's Atorvastatin Rx. The patient's insurance will not pay for a 30-day supply. They need verbal to change rx to a 90-day supply.

## 2012-11-25 NOTE — Telephone Encounter (Signed)
Pt with pending appt on 11-29-12 for CPX last lipid done 05-25-11.Last OV7-3-14, Last refilled 10-14-12 #30 1  Please advise ok to fill 90 day supply

## 2012-11-25 NOTE — Telephone Encounter (Signed)
#  90,R X 1 

## 2012-11-29 ENCOUNTER — Encounter: Payer: Self-pay | Admitting: Internal Medicine

## 2012-11-29 ENCOUNTER — Ambulatory Visit (INDEPENDENT_AMBULATORY_CARE_PROVIDER_SITE_OTHER): Payer: Managed Care, Other (non HMO) | Admitting: Internal Medicine

## 2012-11-29 VITALS — BP 122/80 | HR 76 | Temp 98.1°F | Resp 12 | Ht 70.5 in | Wt 249.0 lb

## 2012-11-29 DIAGNOSIS — G2581 Restless legs syndrome: Secondary | ICD-10-CM

## 2012-11-29 DIAGNOSIS — G4730 Sleep apnea, unspecified: Secondary | ICD-10-CM

## 2012-11-29 DIAGNOSIS — Z860101 Personal history of adenomatous and serrated colon polyps: Secondary | ICD-10-CM | POA: Insufficient documentation

## 2012-11-29 DIAGNOSIS — Z86010 Personal history of colonic polyps: Secondary | ICD-10-CM | POA: Insufficient documentation

## 2012-11-29 DIAGNOSIS — Z0000 Encounter for general adult medical examination without abnormal findings: Secondary | ICD-10-CM

## 2012-11-29 DIAGNOSIS — Z23 Encounter for immunization: Secondary | ICD-10-CM

## 2012-11-29 HISTORY — DX: Restless legs syndrome: G25.81

## 2012-11-29 MED ORDER — CLONAZEPAM 0.5 MG PO TABS: ORAL_TABLET | ORAL | Status: DC

## 2012-11-29 NOTE — Patient Instructions (Addendum)
Please review the medication list in the After Visit Summary provided.Please verify the medication name (this may be  brand or generic) & correct dosage. Write the name of the prescribing physician to the right of the medication and share this with all medical staff seen at each appointment. This will help provide continuity of care; help optimize therapeutic interventions;and help prevent drug:drug adverse reaction.  Please perform isometric exercises before going to bed. Sit on side of the bed and raise up on toes to a count of 5. Then put pressure on the heels to a count of 5. Repeat this process 10 times. This will improve blood flow to the calves & help prevent RLS.   Eat a low-fat diet with lots of fruits and vegetables, up to 7-9 servings per day. Consume less than 40 Grams (preferably ZERO) of sugar per day from foods & drinks with High Fructose Corn Syrup (HFCS) sugar as #1,2,3 or # 4 on label.Whole Foods, [employer] & [employer] do not carry products with HFCS. Follow a  low carb nutrition program such as [place] or The [employer] Sugar [employer]  to prevent Diabetes progression . White carbohydrates (potatoes, rice, bread, and pasta) have a high spike of sugar and a high load of sugar. For example a  baked potato has a cup of sugar and a  french fry  2 teaspoons of sugar. Yams, wild  rice, whole grained bread &  wheat pasta have been much lower spike and load of  sugar. Portions should be the size of a deck of cards or your palm.  Cardiovascular exercise, this can be as simple a program as walking, is recommended 30-45 minutes 3-4 times per week. If you're not exercising you should take 6-8 weeks to build up to this level.   Please  schedule fasting Labs in 14-16 weeks after nutrition  & exercise changes: Lipids, A1c. PLEASE BRING THESE INSTRUCTIONS TO FOLLOW UP  LAB APPOINTMENT.This will guarantee correct labs are drawn, eliminating need for repeat blood sampling ( needle sticks ! ). Diagnoses  /Codes: 272.4;250.02

## 2012-11-29 NOTE — Progress Notes (Signed)
  Subjective:    Patient ID: [name], male    DOB: Jan 07, 1951, 62 y.o.   MRN: [id number]  HPI He  is here for a physical;acute issues include intermittent  tingling in legs @ night while asleep for > 10 years.     Review of Systems  The symptoms do improve with ambulation. They seem to exacerbate after any surgical intervention. He does have difficulty keeping his legs still. No treatment to date.     Objective:   Physical Exam  Gen.:  well-nourished in appearance. Alert, appropriate and cooperative throughout exam.  Head: Normocephalic without obvious abnormalities;  Head shaven Eyes: No corneal or conjunctival inflammation noted. Extraocular motion intact. Vision grossly normal with lenses Ears: External  ear exam reveals no significant lesions or deformities. Canals clear .TMs normal. Hearing is grossly normal bilaterally. Nose: External nasal exam reveals no deformity or inflammation. Nasal mucosa are pink and moist. No lesions or exudates noted.  Mouth: Oral mucosa and oropharynx reveal no lesions or exudates. Teeth in good repair. Neck: No deformities, masses, or tenderness noted. Range of motion & Thyroid normal. Lungs: Normal respiratory effort; chest expands symmetrically. Lungs are clear to auscultation without rales, wheezes, or increased work of breathing. Heart: Normal rate and rhythm. Normal S1 and S2. No gallop, click, or rub. S4 w/o murmur. Abdomen: Bowel sounds normal; abdomen soft and nontender. No masses, organomegaly or hernias noted. Genitalia: As per Dr Brunilda Payor                                 Musculoskeletal/extremities: No deformity or scoliosis noted of  the thoracic or lumbar spine.  No clubbing, cyanosis, edema, or significant extremity  deformity noted. Range of motion normal .Tone & strength  Normal. Joints normal . Nail health good. Able to lie down & sit up w/o help. Negative SLR bilaterally Vascular: Carotid, radial artery, dorsalis pedis and  posterior  tibial pulses are full and equal. No bruits present. Neurologic: Alert and oriented x3. Deep tendon reflexes symmetrical and normal.        Skin: Intact without suspicious lesions or rashes. Thoracic keloids.Accessory areola on L Lymph: No cervical, axillary lymphadenopathy present. Psych: Mood and affect are normal. Normally interactive                                                                                        Assessment & Plan:  #1 comprehensive physical exam; no acute findings #2 RLS  Plan: see Orders  & Recommendations

## 2012-12-20 ENCOUNTER — Institutional Professional Consult (permissible substitution): Payer: Managed Care, Other (non HMO) | Admitting: Pulmonary Disease

## 2012-12-20 ENCOUNTER — Telehealth: Payer: Self-pay

## 2012-12-20 NOTE — Telephone Encounter (Signed)
pham called    

## 2013-01-26 ENCOUNTER — Other Ambulatory Visit: Payer: Self-pay | Admitting: Internal Medicine

## 2013-01-26 NOTE — Telephone Encounter (Signed)
PLEASE MAKE AN OFFICE VISIT FOR FURTHER REFILLS  

## 2013-02-01 NOTE — Telephone Encounter (Signed)
Med filled.  

## 2013-02-06 ENCOUNTER — Ambulatory Visit (INDEPENDENT_AMBULATORY_CARE_PROVIDER_SITE_OTHER): Payer: Managed Care, Other (non HMO) | Admitting: *Deleted

## 2013-02-06 DIAGNOSIS — I428 Other cardiomyopathies: Secondary | ICD-10-CM

## 2013-02-06 DIAGNOSIS — I5022 Chronic systolic (congestive) heart failure: Secondary | ICD-10-CM

## 2013-02-07 ENCOUNTER — Encounter: Payer: Self-pay | Admitting: Internal Medicine

## 2013-02-07 LAB — REMOTE ICD DEVICE
CHARGE TIME: 3.733 s
HV IMPEDENCE: 63 Ohm
PACEART VT: 0
RV LEAD AMPLITUDE: 8.8 mv
TOT-0002: 0
TOT-0006: 20140530000000
TZAT-0004SLOWVT: 8
TZAT-0004SLOWVT: 8
TZAT-0005FASTVT: 88 pct
TZAT-0005SLOWVT: 84 pct
TZAT-0005SLOWVT: 84 pct
TZAT-0011FASTVT: 10 ms
TZAT-0012FASTVT: 170 ms
TZAT-0012SLOWVT: 170 ms
TZAT-0012SLOWVT: 170 ms
TZAT-0013FASTVT: 1
TZAT-0013SLOWVT: 2
TZAT-0013SLOWVT: 3
TZAT-0018FASTVT: NEGATIVE
TZAT-0020SLOWVT: 1.5 ms
TZON-0003FASTVT: 240 ms
TZON-0003SLOWVT: 370 ms
TZON-0003VSLOWVT: 450 ms
TZON-0004SLOWVT: 32
TZON-0004VSLOWVT: 28
TZST-0001FASTVT: 3
TZST-0001FASTVT: 5
TZST-0001SLOWVT: 4
TZST-0001SLOWVT: 6
TZST-0003FASTVT: 35 J
TZST-0003FASTVT: 35 J
TZST-0003FASTVT: 35 J
TZST-0003SLOWVT: 35 J
VENTRICULAR PACING ICD: 0.01 pct

## 2013-02-16 ENCOUNTER — Other Ambulatory Visit: Payer: Self-pay | Admitting: *Deleted

## 2013-02-16 MED ORDER — SITAGLIPTIN PHOS-METFORMIN HCL 50-1000 MG PO TABS: ORAL_TABLET | ORAL | Status: DC

## 2013-02-16 NOTE — Telephone Encounter (Signed)
Janumet refill sent to pharmacy 

## 2013-02-21 ENCOUNTER — Encounter: Payer: Self-pay | Admitting: *Deleted

## 2013-03-02 ENCOUNTER — Ambulatory Visit (INDEPENDENT_AMBULATORY_CARE_PROVIDER_SITE_OTHER): Payer: Managed Care, Other (non HMO) | Admitting: General Practice

## 2013-03-02 DIAGNOSIS — Z23 Encounter for immunization: Secondary | ICD-10-CM

## 2013-03-27 ENCOUNTER — Other Ambulatory Visit: Payer: Self-pay | Admitting: Internal Medicine

## 2013-03-31 ENCOUNTER — Other Ambulatory Visit: Payer: Self-pay | Admitting: *Deleted

## 2013-03-31 MED ORDER — SITAGLIPTIN PHOS-METFORMIN HCL 50-1000 MG PO TABS: ORAL_TABLET | ORAL | Status: DC

## 2013-03-31 NOTE — Telephone Encounter (Signed)
Janumet refilled #[phone] per pt request

## 2013-04-14 ENCOUNTER — Telehealth: Payer: Self-pay | Admitting: Internal Medicine

## 2013-04-14 NOTE — Telephone Encounter (Signed)
Patient called and wanted to see if he could  come in and have some blood work done and to see did he need a check up with dr Alwyn Ren before his insurance change over.

## 2013-04-19 NOTE — Telephone Encounter (Signed)
Spoke with the pt and he stated he would like to come in for a CPE by tomorrow b/c his ins will be changing.   Informed the pt that Dr. Alwyn Ren is booked up for tomorrow and will not be able to get him in on tomorrow.  Pt stated that's okay.  Informed the pt he should go ahead and schedule appt for CPE in Jan.  Pt agreed and will call back.//AB/CMA

## 2013-04-20 ENCOUNTER — Other Ambulatory Visit: Payer: Self-pay | Admitting: Internal Medicine

## 2013-04-21 ENCOUNTER — Ambulatory Visit (INDEPENDENT_AMBULATORY_CARE_PROVIDER_SITE_OTHER): Payer: Managed Care, Other (non HMO) | Admitting: Cardiology

## 2013-04-21 ENCOUNTER — Encounter: Payer: Self-pay | Admitting: Cardiology

## 2013-04-21 VITALS — BP 128/80 | HR 71 | Ht 70.5 in | Wt 250.0 lb

## 2013-04-21 DIAGNOSIS — Z95810 Presence of automatic (implantable) cardiac defibrillator: Secondary | ICD-10-CM

## 2013-04-21 DIAGNOSIS — I428 Other cardiomyopathies: Secondary | ICD-10-CM

## 2013-04-21 DIAGNOSIS — I5022 Chronic systolic (congestive) heart failure: Secondary | ICD-10-CM

## 2013-04-21 DIAGNOSIS — I472 Ventricular tachycardia, unspecified: Secondary | ICD-10-CM

## 2013-04-21 NOTE — Progress Notes (Signed)
Patient ID: MIKHI ATHEY MRN: [id number], DOB/AGE: 12-Apr-1951   Date of Visit: 04/21/2013  Primary Physician: Marga Melnick, MD Primary Cardiologist: Ladona Ridgel, MD Reason for Visit: EP/device follow-up  History of Present Illness  [name] is a 62 y.o. male with a nonischemic cardiomyopathy, chronic systolic heart failure, ventricular tachycardia, status post prior ICD implantation who underwent ICD generator change and RV lead revision May 2014. He presents today for routine electrophysiology followup.   Today, he reports he is doing well and has no complaints other than sharp twinges of pain over the scar at the implant site. He has formed a hypertrophic / keloid scar. He is otherwise without complaint. He denies chest pain or shortness of breath. He denies palpitations, dizziness, near syncope or syncope. He denies LE swelling, orthopnea, PND or recent weight gain. He is compliant and tolerating medications without difficulty.  Past Medical History Past Medical History  Diagnosis Date  . Ventricular tachycardia     a. s/p ICD (generator change 09/2012). b. 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 and placed on sotalol  . Hypertension   . Diabetes mellitus   . NICM (nonischemic cardiomyopathy)     a.normal cors by cath in 2005. b. s/p Medtronic ICD.  [name] Prostate cancer     s/p prostatectomy  . Sleep apnea   . Hyperlipidemia   . Chronic systolic heart failure   . Anxiety     since defibrillator placement  . Arthritis     knees  . Cataract   . GERD (gastroesophageal reflux disease)     Past Surgical History Past Surgical History  Procedure Laterality Date  . Cardiac defibrillator placement  03/11/2004    Medtronic Maximo single lead  . Prostatectomy      Dr Brunilda Payor  . Rectal surgery    . Upper gastrointestinal endoscopy  08/2004, 02/24/11    Barrett's esophagus  . Colonoscopy w/ biopsies and polypectomy  08/2004, 02/24/11    6mm adenoma in 2006,  5 mm polyp (not recovered) 2012      Allergies/Intolerances No Known Allergies  Current Home Medications Current Outpatient Prescriptions  Medication Sig Dispense Refill  . atorvastatin (LIPITOR) 20 MG tablet Take 1 tablet (20 mg total) by mouth daily.  90 tablet  1  . carvedilol (COREG) 12.5 MG tablet TAKE 1 & 1/2 TABLETS TWICE A DAY WITH MEALS  270 tablet  1  . clonazePAM (KLONOPIN) 0.5 MG tablet 1 nightly if RLS symptoms prevent sleep  30 tablet  2  . digoxin (LANOXIN) 0.125 MG tablet Take 0.125 mg by mouth daily.      [name] omeprazole (PRILOSEC) 20 MG capsule TAKE 1 CAPSULE (20 MG TOTAL) BY MOUTH DAILY. 30 MINUTES BEFORE BREAKFAST  30 capsule  0  . ramipril (ALTACE) 10 MG capsule TAKE 1 CAPSULE BY MOUTH TWICE DAILY  180 capsule  1  . sitaGLIPtan-metformin (JANUMET) 50-1000 MG per tablet Take 1 tablet by mouth daily with supper.      . sotalol (BETAPACE) 120 MG tablet TAKE 1 TABLET (120 MG TOTAL) BY MOUTH 2 (TWO) TIMES DAILY.  180 tablet  1   No current facility-administered medications for this visit.    Social History Social History  . Marital Status: Married   Social History Main Topics  . Smoking status: Never Smoker   . Smokeless tobacco: Never Used  . Alcohol Use: No  . Drug Use: No   Review of Systems General: No chills, fever, night sweats or weight changes Cardiovascular:  No chest pain, dyspnea on exertion, edema, orthopnea, palpitations, paroxysmal nocturnal dyspnea Dermatological: No rash, lesions or masses Respiratory: No cough, dyspnea Urologic: No hematuria, dysuria Abdominal: No nausea, vomiting, diarrhea, bright red blood per rectum, melena, or hematemesis Neurologic: No visual changes, weakness, changes in mental status All other systems reviewed and are otherwise negative except as noted above.  Physical Exam Vitals: Blood pressure 128/80, pulse 71, height 5' 10.5" (1.791 m), weight 250 lb (113.399 kg), SpO2 97.00%.  General: Well developed, well appearing 62 y.o. male in no acute  distress. HEENT: Normocephalic, atraumatic. EOMs intact. Sclera nonicteric. Oropharynx clear.  Neck: Supple. No JVD. Lungs: Respirations regular and unlabored, CTA bilaterally. No wheezes, rales or rhonchi. Heart: RRR. S1, S2 present. No murmurs, rub, S3 or S4. Abdomen: Soft, non-distended.   Extremities: No clubbing, cyanosis or edema. PT/Radials 2+ and equal bilaterally. Psych: Normal affect. Neuro: Alert and oriented X 3. Moves all extremities spontaneously. Skin: Left upper chest / implant site intact and well healed with hypertrophic scar. There is no evidence of inflammation or infection. No erythema, edema, warmth or drainage. No pain on palpation of pocket and there is no evidence of adhesion.   Diagnostics Device interrogation today - Normal device function. Thresholds and sensing consistent with previous device measurements. Impedance trends stable over time. 1 VT-NS episode Aug 2014, 1 second in duration, EGM consistent with NSVT. Otherwise no evidence of any ventricular arrhythmias. Histogram distribution appropriate for patient and level of activity. No changes made this session. Device programmed at appropriate safety margins. Device programmed to optimize intrinsic conduction. Estimated longevity 11.2 years.   Assessment and Plan 1. NICM s/p ICD implant, s/p ICD gen change and RV lead revision May 2014 - normal device function - no programming changes made - has formed hypertrophic / keloid scar at incision site which is symptomatic at times; will follow; he was instructed to report worsening symptoms - continue routine remote ICD follow-up every 3 months - return for follow-up with Dr Ladona Ridgel in one year 2. Paroxysmal VT - stable - continue sotalol as previously directed by Dr. Ladona Ridgel - BMET July 2014 showed normal serum Cr 3. Chronic systolic HF - stable without HF symptoms - continue medical therapy with carvedilol and ramipril  Signed, Rick Duff,  PA-C 04/21/2013, 3:42 PM

## 2013-04-21 NOTE — Patient Instructions (Signed)
Your physician recommends that you continue on your current medications as directed. Please refer to the Current Medication list given to you today.     

## 2013-04-24 LAB — MDC_IDC_ENUM_SESS_TYPE_INCLINIC
Battery Remaining Longevity: 134 mo
Brady Statistic RV Percent Paced: 0 %
Lead Channel Impedance Value: 456 Ohm
Lead Channel Pacing Threshold Amplitude: 0.5 V
Lead Channel Pacing Threshold Pulse Width: 0.4 ms
Lead Channel Sensing Intrinsic Amplitude: 8.1 mV
Lead Channel Setting Pacing Amplitude: 2 V
Lead Channel Setting Pacing Pulse Width: 0.4 ms
Zone Setting Detection Interval: 240 ms
Zone Setting Detection Interval: 370 ms

## 2013-05-06 ENCOUNTER — Other Ambulatory Visit: Payer: Self-pay | Admitting: Internal Medicine

## 2013-05-08 ENCOUNTER — Encounter: Payer: Self-pay | Admitting: Internal Medicine

## 2013-05-08 NOTE — Telephone Encounter (Signed)
Atorvastatin and ramipril refilled per protocol. JG//CMA

## 2013-06-05 ENCOUNTER — Other Ambulatory Visit: Payer: Self-pay | Admitting: Internal Medicine

## 2013-06-07 ENCOUNTER — Encounter: Payer: Self-pay | Admitting: Internal Medicine

## 2013-06-07 MED ORDER — OMEPRAZOLE 20 MG PO CPDR: DELAYED_RELEASE_CAPSULE | ORAL | Status: DC

## 2013-06-07 NOTE — Telephone Encounter (Signed)
Rx sent to the pharmacy by e-script.//AB/CMA 

## 2013-06-16 ENCOUNTER — Other Ambulatory Visit: Payer: Self-pay | Admitting: *Deleted

## 2013-06-16 MED ORDER — DIGOXIN 125 MCG PO TABS: 0.1250 mg | ORAL_TABLET | Freq: Every day | ORAL | Status: DC

## 2013-07-11 ENCOUNTER — Other Ambulatory Visit: Payer: Self-pay | Admitting: Internal Medicine

## 2013-07-24 ENCOUNTER — Encounter: Payer: Managed Care, Other (non HMO) | Admitting: *Deleted

## 2013-07-31 ENCOUNTER — Encounter: Payer: Self-pay | Admitting: *Deleted

## 2013-08-06 ENCOUNTER — Encounter: Payer: Self-pay | Admitting: Internal Medicine

## 2013-08-06 LAB — MDC_IDC_ENUM_SESS_TYPE_REMOTE
Brady Statistic RV Percent Paced: 0.01 %
HIGH POWER IMPEDANCE MEASURED VALUE: 171 Ohm
HIGH POWER IMPEDANCE MEASURED VALUE: 63 Ohm
Lead Channel Impedance Value: 475 Ohm
Lead Channel Pacing Threshold Amplitude: 0.375 V
Lead Channel Pacing Threshold Pulse Width: 0.4 ms
Lead Channel Sensing Intrinsic Amplitude: 8.25 mV
Lead Channel Setting Pacing Amplitude: 2 V
Lead Channel Setting Pacing Pulse Width: 0.4 ms
Lead Channel Setting Sensing Sensitivity: 0.3 mV
MDC IDC MSMT BATTERY REMAINING LONGEVITY: 133 mo
MDC IDC MSMT BATTERY VOLTAGE: 3.05 V
MDC IDC SESS DTM: 20150329235923
MDC IDC SET ZONE DETECTION INTERVAL: 240 ms
MDC IDC SET ZONE DETECTION INTERVAL: 450 ms
Zone Setting Detection Interval: 290 ms
Zone Setting Detection Interval: 370 ms

## 2013-08-07 ENCOUNTER — Ambulatory Visit (INDEPENDENT_AMBULATORY_CARE_PROVIDER_SITE_OTHER): Payer: Managed Care, Other (non HMO) | Admitting: *Deleted

## 2013-08-07 DIAGNOSIS — Z95810 Presence of automatic (implantable) cardiac defibrillator: Secondary | ICD-10-CM

## 2013-08-07 DIAGNOSIS — I428 Other cardiomyopathies: Secondary | ICD-10-CM

## 2013-08-10 ENCOUNTER — Ambulatory Visit (INDEPENDENT_AMBULATORY_CARE_PROVIDER_SITE_OTHER): Payer: Managed Care, Other (non HMO) | Admitting: Internal Medicine

## 2013-08-10 ENCOUNTER — Other Ambulatory Visit (INDEPENDENT_AMBULATORY_CARE_PROVIDER_SITE_OTHER): Payer: Managed Care, Other (non HMO)

## 2013-08-10 ENCOUNTER — Encounter: Payer: Self-pay | Admitting: Internal Medicine

## 2013-08-10 VITALS — BP 136/104 | HR 78 | Temp 97.7°F | Resp 14 | Wt 242.4 lb

## 2013-08-10 DIAGNOSIS — E119 Type 2 diabetes mellitus without complications: Secondary | ICD-10-CM

## 2013-08-10 DIAGNOSIS — I10 Essential (primary) hypertension: Secondary | ICD-10-CM

## 2013-08-10 DIAGNOSIS — J209 Acute bronchitis, unspecified: Secondary | ICD-10-CM

## 2013-08-10 DIAGNOSIS — IMO0001 Reserved for inherently not codable concepts without codable children: Secondary | ICD-10-CM

## 2013-08-10 DIAGNOSIS — E1165 Type 2 diabetes mellitus with hyperglycemia: Secondary | ICD-10-CM

## 2013-08-10 DIAGNOSIS — E538 Deficiency of other specified B group vitamins: Secondary | ICD-10-CM

## 2013-08-10 LAB — BASIC METABOLIC PANEL
BUN: 9 mg/dL (ref 6–23)
CO2: 28 mEq/L (ref 19–32)
CREATININE: 1 mg/dL (ref 0.4–1.5)
Calcium: 8.8 mg/dL (ref 8.4–10.5)
Chloride: 103 mEq/L (ref 96–112)
GFR: 103.24 mL/min (ref >60.00)
GLUCOSE: 163 mg/dL — AB (ref 70–99)
POTASSIUM: 3.6 meq/L (ref 3.5–5.1)
Sodium: 138 mEq/L (ref 135–145)

## 2013-08-10 LAB — CBC WITH DIFFERENTIAL/PLATELET
Basophils Absolute: 0 10*3/uL (ref 0.0–0.1)
Basophils Relative: 0.1 % (ref 0.0–3.0)
EOS PCT: 2.6 % (ref 0.0–5.0)
Eosinophils Absolute: 0.2 10*3/uL (ref 0.0–0.7)
HEMATOCRIT: 37.8 % — AB (ref 39.0–52.0)
Hemoglobin: 11.8 g/dL — ABNORMAL LOW (ref 13.0–17.0)
LYMPHS ABS: 2.5 10*3/uL (ref 0.7–4.0)
Lymphocytes Relative: 26.1 % (ref 12.0–46.0)
MCHC: 31.1 g/dL (ref 30.0–36.0)
MCV: 73.7 fl — ABNORMAL LOW (ref 78.0–100.0)
Monocytes Absolute: 0.9 10*3/uL (ref 0.1–1.0)
Monocytes Relative: 9.4 % (ref 3.0–12.0)
Neutro Abs: 5.8 10*3/uL (ref 1.4–7.7)
Neutrophils Relative %: 61.8 % (ref 43.0–77.0)
Platelets: 239 10*3/uL (ref 150.0–400.0)
RBC: 5.14 Mil/uL (ref 4.22–5.81)
RDW: 16.7 % — AB (ref 11.5–14.6)
WBC: 9.4 10*3/uL (ref 4.5–10.5)

## 2013-08-10 LAB — HEMOGLOBIN A1C: Hgb A1c MFr Bld: 7.3 % — ABNORMAL HIGH (ref 4.6–6.5)

## 2013-08-10 LAB — VITAMIN B12: Vitamin B-12: 296 pg/mL (ref 211–911)

## 2013-08-10 LAB — MICROALBUMIN / CREATININE URINE RATIO
Creatinine,U: 120.6 mg/dL
Microalb Creat Ratio: 1.2 mg/g (ref 0.0–30.0)
Microalb, Ur: 1.5 mg/dL (ref 0.0–1.9)

## 2013-08-10 MED ORDER — HYDROCODONE-HOMATROPINE 5-1.5 MG/5ML PO SYRP: 5.0000 mL | ORAL_SOLUTION | Freq: Four times a day (QID) | ORAL | Status: DC | PRN

## 2013-08-10 MED ORDER — AMOXICILLIN 500 MG PO CAPS: 500.0000 mg | ORAL_CAPSULE | Freq: Three times a day (TID) | ORAL | Status: DC

## 2013-08-10 NOTE — Patient Instructions (Signed)
Plain Mucinex (NOT D) for thick secretions ;force NON dairy fluids .   Nasal cleansing in the shower as discussed with lather of mild shampoo.After 10 seconds wash off lather while  exhaling through nostrils. Make sure that all residual soap is removed to prevent irritation.  Flonase OR Nasacort AQ 1 spray in each nostril twice a day as needed. Use the "crossover" technique into opposite nostril spraying toward opposite ear @ 45 degree angle, not straight up into nostril.  Use a Neti pot daily only  as needed for significant sinus congestion; going from open side to congested side . Plain Allegra (NOT D )  160 daily , Loratidine 10 mg , OR Zyrtec 10 mg @ bedtime  as needed for itchy eyes & sneezing.   Minimal Blood Pressure Goal= AVERAGE < 140/90;  Ideal is an AVERAGE < 135/85. This AVERAGE should be calculated from @ least 5-7 BP readings taken @ different times of day on different days of week. You should not respond to isolated BP readings , but rather the AVERAGE for that week .Please bring your  blood pressure cuff to office visits to verify that it is reliable.It  can also be checked against the blood pressure device at the pharmacy. Finger or wrist cuffs are not dependable; an arm cuff is. 

## 2013-08-10 NOTE — Progress Notes (Signed)
   Subjective:    Patient ID: [name], male    DOB: 08-18-1950, 63 y.o.   MRN: [id number]  HPI  Symptoms began 4 days ago as a cough which has been productive of green sputum . Coughing spells last up to a minute. This began after he was exposed to a coworker who was ill with a respiratory tract infection. Symptoms have been essentially stable. He's been using Coricidin HP with only partial benefit.  He has never smoked. He is on ramipril, ACE inhibitor.  BP not monitord; meds not taken today.FBS in 140s; last A1c 7.1% in 7/14. No nasal B12 X 6 mos; shots not taken for > 12 mos.Last B12  253 in 10/13.    Review of Systems He specifically denies frontal headache, facial pain, nasal purulence, otic pain, otic discharge  He also has no fever, chills, or sweats.  The cough is not associated with wheezing or shortness of breath.       Objective:   Physical Exam General appearance:good health ;well nourished; no acute distress or increased work of breathing is present.  No  lymphadenopathy about the head, neck, or axilla noted.   Eyes: No conjunctival inflammation or lid edema is present. There is no scleral icterus.  Ears:  External ear exam shows no significant lesions or deformities.  Otoscopic examination reveals clear canals, tympanic membranes are intact bilaterally without bulging, retraction, inflammation or discharge.  Nose:  External nasal examination shows no deformity or inflammation. Nasal mucosa are boggy and moist without lesions or exudates. No septal dislocation or deviation.No obstruction to airflow.   Oral exam: Dental hygiene is good; lips and gums are healthy appearing.There is no oropharyngeal erythema or exudate noted.   Neck:  No deformities, thyromegaly, masses, or tenderness noted.   Supple with full range of motion without pain.   Heart:  Normal rate and regular rhythm. S1 and S2 normal without gallop, murmur, click, rub or other extra sounds.    Lungs:Chest clear to auscultation; no wheezes, rhonchi,rales ,or rubs present.No increased work of breathing.    Extremities:  No cyanosis, edema, or clubbing  noted    Skin: Warm & dry w/o jaundice or tenting.         Assessment & Plan:  #1 acute bronchitis #2 See Current Assessment & Plan in Problem List under specific Diagnosis

## 2013-08-10 NOTE — Assessment & Plan Note (Signed)
CBC & B12

## 2013-08-10 NOTE — Assessment & Plan Note (Signed)
A1c , urine microalbumin, BMET 

## 2013-08-10 NOTE — Progress Notes (Signed)
Pre visit review using our clinic review tool, if applicable. No additional management support is needed unless otherwise documented below in the visit note. 

## 2013-08-10 NOTE — Assessment & Plan Note (Signed)
Blood pressure goals reviewed. BMET 

## 2013-08-12 ENCOUNTER — Other Ambulatory Visit: Payer: Self-pay | Admitting: Internal Medicine

## 2013-08-12 DIAGNOSIS — E1165 Type 2 diabetes mellitus with hyperglycemia: Principal | ICD-10-CM

## 2013-08-12 DIAGNOSIS — IMO0001 Reserved for inherently not codable concepts without codable children: Secondary | ICD-10-CM

## 2013-08-12 DIAGNOSIS — E538 Deficiency of other specified B group vitamins: Secondary | ICD-10-CM

## 2013-08-14 ENCOUNTER — Telehealth: Payer: Self-pay | Admitting: Internal Medicine

## 2013-08-14 NOTE — Telephone Encounter (Signed)
Relevant patient education assigned to patient using Emmi. ° °

## 2013-08-24 ENCOUNTER — Encounter: Payer: Self-pay | Admitting: *Deleted

## 2013-09-07 ENCOUNTER — Encounter: Payer: Self-pay | Admitting: Internal Medicine

## 2013-09-07 MED ORDER — BLOOD GLUCOSE TEST VI STRP: ORAL_STRIP | Status: DC

## 2013-09-22 ENCOUNTER — Other Ambulatory Visit: Payer: Self-pay | Admitting: *Deleted

## 2013-09-22 MED ORDER — SITAGLIPTIN PHOS-METFORMIN HCL 50-1000 MG PO TABS: 1.0000 | ORAL_TABLET | Freq: Every day | ORAL | Status: DC

## 2013-10-04 ENCOUNTER — Other Ambulatory Visit: Payer: Self-pay

## 2013-10-04 MED ORDER — DIGOXIN 125 MCG PO TABS: 0.1250 mg | ORAL_TABLET | Freq: Every day | ORAL | Status: DC

## 2013-10-09 ENCOUNTER — Other Ambulatory Visit: Payer: Self-pay

## 2013-10-09 MED ORDER — CARVEDILOL 12.5 MG PO TABS: ORAL_TABLET | ORAL | Status: DC

## 2013-11-13 ENCOUNTER — Telehealth: Payer: Self-pay | Admitting: Cardiology

## 2013-11-13 ENCOUNTER — Ambulatory Visit (INDEPENDENT_AMBULATORY_CARE_PROVIDER_SITE_OTHER): Payer: Managed Care, Other (non HMO) | Admitting: *Deleted

## 2013-11-13 ENCOUNTER — Other Ambulatory Visit: Payer: Self-pay

## 2013-11-13 DIAGNOSIS — Z95810 Presence of automatic (implantable) cardiac defibrillator: Secondary | ICD-10-CM

## 2013-11-13 DIAGNOSIS — I5022 Chronic systolic (congestive) heart failure: Secondary | ICD-10-CM

## 2013-11-13 DIAGNOSIS — I4729 Other ventricular tachycardia: Secondary | ICD-10-CM

## 2013-11-13 DIAGNOSIS — I472 Ventricular tachycardia, unspecified: Secondary | ICD-10-CM

## 2013-11-13 DIAGNOSIS — I428 Other cardiomyopathies: Secondary | ICD-10-CM

## 2013-11-13 MED ORDER — RAMIPRIL 10 MG PO CAPS: ORAL_CAPSULE | ORAL | Status: DC

## 2013-11-13 MED ORDER — ATORVASTATIN CALCIUM 20 MG PO TABS: ORAL_TABLET | ORAL | Status: DC

## 2013-11-13 NOTE — Telephone Encounter (Signed)
Confirmed remote transmission with pt wife.

## 2013-11-14 ENCOUNTER — Encounter: Payer: Self-pay | Admitting: Cardiology

## 2013-11-14 ENCOUNTER — Telehealth: Payer: Self-pay | Admitting: Internal Medicine

## 2013-11-14 NOTE — Telephone Encounter (Signed)
Spoke with pt and informed pt that we did not receive the transmission. Informed pt to call tech support number. Pt requested that  I call back and leave tech support # on his VM. I informed pt that I would do so. Pt verbalized understanding. LMOVM with [facility] tech support #.

## 2013-11-14 NOTE — Telephone Encounter (Signed)
New message     Did we get his remote defib transmission?

## 2013-11-15 ENCOUNTER — Telehealth: Payer: Self-pay | Admitting: *Deleted

## 2013-11-15 ENCOUNTER — Encounter: Payer: Self-pay | Admitting: Internal Medicine

## 2013-11-15 DIAGNOSIS — I428 Other cardiomyopathies: Secondary | ICD-10-CM

## 2013-11-15 DIAGNOSIS — Z95810 Presence of automatic (implantable) cardiac defibrillator: Secondary | ICD-10-CM

## 2013-11-15 DIAGNOSIS — IMO0001 Reserved for inherently not codable concepts without codable children: Secondary | ICD-10-CM

## 2013-11-15 DIAGNOSIS — I5022 Chronic systolic (congestive) heart failure: Secondary | ICD-10-CM

## 2013-11-15 DIAGNOSIS — E1165 Type 2 diabetes mellitus with hyperglycemia: Principal | ICD-10-CM

## 2013-11-15 NOTE — Progress Notes (Signed)
Remote ICD transmission.   

## 2013-11-15 NOTE — Telephone Encounter (Signed)
Left message on machine for patient to come by the lab for a cholesterol check. Lipid ordered Message sent to mychart Diabetic bundle

## 2013-11-17 LAB — MDC_IDC_ENUM_SESS_TYPE_REMOTE
Battery Remaining Longevity: 131 mo
Date Time Interrogation Session: 20150708163146
HIGH POWER IMPEDANCE MEASURED VALUE: 190 Ohm
HIGH POWER IMPEDANCE MEASURED VALUE: 62 Ohm
Lead Channel Impedance Value: 475 Ohm
Lead Channel Pacing Threshold Pulse Width: 0.4 ms
Lead Channel Sensing Intrinsic Amplitude: 7.125 mV
Lead Channel Sensing Intrinsic Amplitude: 7.125 mV
Lead Channel Setting Pacing Amplitude: 2 V
Lead Channel Setting Pacing Pulse Width: 0.4 ms
Lead Channel Setting Sensing Sensitivity: 0.3 mV
MDC IDC MSMT BATTERY VOLTAGE: 2.99 V
MDC IDC MSMT LEADCHNL RV PACING THRESHOLD AMPLITUDE: 0.5 V
MDC IDC SET ZONE DETECTION INTERVAL: 370 ms
MDC IDC STAT BRADY RV PERCENT PACED: 0.01 %
Zone Setting Detection Interval: 240 ms
Zone Setting Detection Interval: 290 ms
Zone Setting Detection Interval: 450 ms

## 2013-11-23 ENCOUNTER — Other Ambulatory Visit (INDEPENDENT_AMBULATORY_CARE_PROVIDER_SITE_OTHER): Payer: Managed Care, Other (non HMO)

## 2013-11-23 DIAGNOSIS — IMO0001 Reserved for inherently not codable concepts without codable children: Secondary | ICD-10-CM

## 2013-11-23 DIAGNOSIS — E538 Deficiency of other specified B group vitamins: Secondary | ICD-10-CM

## 2013-11-23 DIAGNOSIS — E1165 Type 2 diabetes mellitus with hyperglycemia: Principal | ICD-10-CM

## 2013-11-23 LAB — LIPID PANEL
Cholesterol: 84 mg/dL (ref 0–200)
HDL: 28.3 mg/dL — ABNORMAL LOW (ref >39.00)
LDL CALC: 44 mg/dL (ref 0–99)
NonHDL: 55.7
TRIGLYCERIDES: 58 mg/dL (ref 0.0–149.0)
Total CHOL/HDL Ratio: 3
VLDL: 11.6 mg/dL (ref 0.0–40.0)

## 2013-11-23 LAB — CBC WITH DIFFERENTIAL/PLATELET
BASOS PCT: 0.2 % (ref 0.0–3.0)
Basophils Absolute: 0 10*3/uL (ref 0.0–0.1)
Eosinophils Absolute: 0.2 10*3/uL (ref 0.0–0.7)
Eosinophils Relative: 2.1 % (ref 0.0–5.0)
HCT: 39.5 % (ref 39.0–52.0)
Hemoglobin: 12.3 g/dL — ABNORMAL LOW (ref 13.0–17.0)
Lymphocytes Relative: 30 % (ref 12.0–46.0)
Lymphs Abs: 2.7 10*3/uL (ref 0.7–4.0)
MCHC: 31.2 g/dL (ref 30.0–36.0)
MCV: 73.7 fl — AB (ref 78.0–100.0)
MONO ABS: 0.8 10*3/uL (ref 0.1–1.0)
MONOS PCT: 8.8 % (ref 3.0–12.0)
NEUTROS PCT: 58.9 % (ref 43.0–77.0)
Neutro Abs: 5.3 10*3/uL (ref 1.4–7.7)
PLATELETS: 246 10*3/uL (ref 150.0–400.0)
RBC: 5.36 Mil/uL (ref 4.22–5.81)
RDW: 16.7 % — ABNORMAL HIGH (ref 11.5–15.5)
WBC: 9 10*3/uL (ref 4.0–10.5)

## 2013-11-23 LAB — HEMOGLOBIN A1C: Hgb A1c MFr Bld: 7.3 % — ABNORMAL HIGH (ref 4.6–6.5)

## 2013-11-23 LAB — VITAMIN B12: Vitamin B-12: 224 pg/mL (ref 211–911)

## 2013-11-27 ENCOUNTER — Encounter: Payer: Self-pay | Admitting: Cardiology

## 2013-12-01 ENCOUNTER — Other Ambulatory Visit: Payer: Self-pay | Admitting: Internal Medicine

## 2013-12-01 ENCOUNTER — Telehealth: Payer: Self-pay

## 2013-12-01 ENCOUNTER — Other Ambulatory Visit: Payer: Self-pay

## 2013-12-01 MED ORDER — SOTALOL HCL 120 MG PO TABS: ORAL_TABLET | ORAL | Status: DC

## 2013-12-01 NOTE — Telephone Encounter (Signed)
Message copied by Shelly Coss on Fri Dec 01, 2013  8:06 AM ------      Message from: Hendricks Limes      Created: Thu Nov 30, 2013  6:08 PM                   A1c is not at goal of less than 7%. Please see me before refilling  diabetic medicines. Please bring all glucose recordings. ------

## 2013-12-01 NOTE — Telephone Encounter (Signed)
Patient has been advised and was transferred to reception to schedule an appt

## 2013-12-05 ENCOUNTER — Ambulatory Visit: Payer: Managed Care, Other (non HMO) | Admitting: Internal Medicine

## 2013-12-06 ENCOUNTER — Emergency Department (HOSPITAL_BASED_OUTPATIENT_CLINIC_OR_DEPARTMENT_OTHER)
Admission: EM | Admit: 2013-12-06 | Discharge: 2013-12-07 | Disposition: A | Discharge status: Home / Self Care | Payer: Managed Care, Other (non HMO) | Attending: Emergency Medicine | Admitting: Emergency Medicine

## 2013-12-06 ENCOUNTER — Ambulatory Visit (INDEPENDENT_AMBULATORY_CARE_PROVIDER_SITE_OTHER): Payer: Managed Care, Other (non HMO) | Admitting: Internal Medicine

## 2013-12-06 ENCOUNTER — Telehealth: Payer: Self-pay | Admitting: Internal Medicine

## 2013-12-06 ENCOUNTER — Encounter (HOSPITAL_BASED_OUTPATIENT_CLINIC_OR_DEPARTMENT_OTHER): Payer: Self-pay | Admitting: Emergency Medicine

## 2013-12-06 ENCOUNTER — Emergency Department (HOSPITAL_BASED_OUTPATIENT_CLINIC_OR_DEPARTMENT_OTHER): Payer: Managed Care, Other (non HMO)

## 2013-12-06 DIAGNOSIS — IMO0002 Reserved for concepts with insufficient information to code with codable children: Secondary | ICD-10-CM

## 2013-12-06 DIAGNOSIS — R05 Cough: Secondary | ICD-10-CM | POA: Insufficient documentation

## 2013-12-06 DIAGNOSIS — Z792 Long term (current) use of antibiotics: Secondary | ICD-10-CM | POA: Insufficient documentation

## 2013-12-06 DIAGNOSIS — J209 Acute bronchitis, unspecified: Secondary | ICD-10-CM | POA: Insufficient documentation

## 2013-12-06 DIAGNOSIS — R059 Cough, unspecified: Secondary | ICD-10-CM | POA: Insufficient documentation

## 2013-12-06 DIAGNOSIS — E538 Deficiency of other specified B group vitamins: Secondary | ICD-10-CM

## 2013-12-06 DIAGNOSIS — Z0289 Encounter for other administrative examinations: Secondary | ICD-10-CM

## 2013-12-06 DIAGNOSIS — E119 Type 2 diabetes mellitus without complications: Secondary | ICD-10-CM | POA: Insufficient documentation

## 2013-12-06 DIAGNOSIS — Z8659 Personal history of other mental and behavioral disorders: Secondary | ICD-10-CM | POA: Diagnosis not present

## 2013-12-06 DIAGNOSIS — M1710 Unilateral primary osteoarthritis, unspecified knee: Secondary | ICD-10-CM | POA: Diagnosis not present

## 2013-12-06 DIAGNOSIS — I10 Essential (primary) hypertension: Secondary | ICD-10-CM | POA: Diagnosis not present

## 2013-12-06 DIAGNOSIS — Z8546 Personal history of malignant neoplasm of prostate: Secondary | ICD-10-CM | POA: Diagnosis not present

## 2013-12-06 DIAGNOSIS — I5022 Chronic systolic (congestive) heart failure: Secondary | ICD-10-CM | POA: Diagnosis not present

## 2013-12-06 DIAGNOSIS — Z79899 Other long term (current) drug therapy: Secondary | ICD-10-CM | POA: Insufficient documentation

## 2013-12-06 DIAGNOSIS — E785 Hyperlipidemia, unspecified: Secondary | ICD-10-CM

## 2013-12-06 DIAGNOSIS — K219 Gastro-esophageal reflux disease without esophagitis: Secondary | ICD-10-CM | POA: Insufficient documentation

## 2013-12-06 DIAGNOSIS — R4689 Other symptoms and signs involving appearance and behavior: Secondary | ICD-10-CM | POA: Insufficient documentation

## 2013-12-06 MED ORDER — ALBUTEROL SULFATE HFA 108 (90 BASE) MCG/ACT IN AERS
2.0000 | INHALATION_SPRAY | RESPIRATORY_TRACT | Status: DC | PRN
  Administered 2013-12-06: 2 via RESPIRATORY_TRACT
  Filled 2013-12-06: qty 6.7

## 2013-12-06 MED ORDER — HYDROCODONE-HOMATROPINE 5-1.5 MG/5ML PO SYRP: 5.0000 mL | ORAL_SOLUTION | Freq: Four times a day (QID) | ORAL | Status: DC | PRN

## 2013-12-06 MED ORDER — HYDROCOD POLST-CHLORPHEN POLST 10-8 MG/5ML PO LQCR
5.0000 mL | Freq: Once | ORAL | Status: AC
  Administered 2013-12-06: 5 mL via ORAL
  Filled 2013-12-06: qty 5

## 2013-12-06 NOTE — Discharge Instructions (Signed)

## 2013-12-06 NOTE — Patient Instructions (Signed)
Instructed patient on the proper use of administering albuterol mdi via aerochamber patient tolerated well 

## 2013-12-06 NOTE — Telephone Encounter (Signed)
Patient Information:  Caller Name: Chriss Czar  Phone: [phone]  Patient: [name], [name]  Gender: Male  DOB: Feb 29, 1951  Age: 63 Years  PCP: Unice Cobble  Office Follow Up:  Does the office need to follow up with this patient?: No  Instructions For The Office: N/A  RN Note:  no appts available 12/07/13; says he already scheduled an appt for Friday; recommended an UC today for wheezing; says his neighbor is a MD and plans to see him this evening  Symptoms  Reason For Call & Symptoms: c/o cough and sneezing for past 2-3 days; feels a gurgling in his chest; had appt this am at 0800, but missed it; feels like he is wheezing; afebrile; has coughed up green phlegm  Reviewed Health History In EMR: Yes  Reviewed Medications In EMR: Yes  Reviewed Allergies In EMR: Yes  Reviewed Surgeries / Procedures: Yes  Date of Onset of Symptoms: 12/04/2013  Guideline(s) Used:  Cough  Disposition Per Guideline:   Go to Office Now  Reason For Disposition Reached:   Wheezing is present  Advice Given:  N/A  Patient Will Follow Care Advice:  YES

## 2013-12-06 NOTE — ED Notes (Signed)
C/o prod cough, wheezing x 3 days

## 2013-12-06 NOTE — ED Provider Notes (Signed)
CSN: [id number]     Arrival date & time 12/06/13  2213 History   None    This chart was scribed for Jesse Fines, MD by Forrestine Him, ED Scribe. This patient was seen in room MH02/MH02 and the patient's care was started 11:42 PM.   Chief Complaint  Patient presents with  . Cough   HPI  HPI Comments: VALDIS BEVILL is a 63 y.o. male with a PMHx of ventricular tachycardia, HTN, DM, NICM, and hyperlipidemia who presents to the Emergency Department complaining of a moderately severe cough productive of green mucus x 3 days. He also reports associated wheezing, rhinorrhea, and sore throat. He has had one episode of post tussive emesis. No alleviating or aggravating factors at this time. He has not tried any OTC medications or home remedies to help manage symptoms. He denies any fever, chills, diarrhea or chest pain.   Past Medical History  Diagnosis Date  . Ventricular tachycardia     a. s/p ICD (generator change 09/2012). b. 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 and placed on sotalol  . Hypertension   . Diabetes mellitus   . NICM (nonischemic cardiomyopathy)     a.normal cors by cath in 2005. b. s/p Medtronic ICD.  [name] Prostate cancer     s/p prostatectomy  . Sleep apnea   . Hyperlipidemia   . Chronic systolic heart failure   . Anxiety     since defibrillator placement  . Arthritis     knees  . Cataract   . GERD (gastroesophageal reflux disease)    Past Surgical History  Procedure Laterality Date  . Cardiac defibrillator placement  03/11/2004    Medtronic Maximo single lead  . Prostatectomy      Dr Janice Norrie  . Rectal surgery    . Upper gastrointestinal endoscopy  08/2004, 02/24/11    Barrett's esophagus  . Colonoscopy w/ biopsies and polypectomy  08/2004, 02/24/11    77mm adenoma in 2006,  5 mm polyp (not recovered) 2012   Family History  Problem Relation Age of Onset  . Coronary artery disease Father   . Prostate cancer Father   . Hypertension Mother   . Diabetes Paternal Grandmother   . Stroke  Neg Hx    History  Substance Use Topics  . Smoking status: Never Smoker   . Smokeless tobacco: Never Used  . Alcohol Use: No    Review of Systems  All other systems reviewed and are negative.   Allergies  Review of patient's allergies indicates no known allergies.  Home Medications   Prior to Admission medications   Medication Sig Start Date End Date Taking? Authorizing Provider  amoxicillin (AMOXIL) 500 MG capsule Take 1 capsule (500 mg total) by mouth 3 (three) times daily. 08/10/13   Hendricks Limes, MD  atorvastatin (LIPITOR) 20 MG tablet TAKE 1 TABLET (20 MG TOTAL) BY MOUTH DAILY. 11/13/13   Hendricks Limes, MD  carvedilol (COREG) 12.5 MG tablet TAKE 1 & 1/2 TABLETS TWICE A DAY WITH MEALS 10/09/13   Hendricks Limes, MD  clonazePAM [facility]) 0.5 MG tablet 1 nightly if RLS symptoms prevent sleep 11/29/12   Hendricks Limes, MD  digoxin (LANOXIN) 0.125 MG tablet Take 1 tablet (0.125 mg total) by mouth daily. 10/04/13   Hendricks Limes, MD  HYDROcodone-homatropine [facility]) 5-1.5 MG/5ML syrup Take 5 mLs by mouth every 6 (six) hours as needed for cough. 12/06/13   Karen Chafe Gissel Keilman, MD  HYDROcodone-homatropine (HYDROMET) 5-1.5 MG/5ML syrup Take  5 mLs by mouth every 6 (six) hours as needed for cough. 08/10/13   Hendricks Limes, MD  omeprazole (PRILOSEC) 20 MG capsule TAKE 1 CAPSULE (20MG  TOTAL) BY MOUTH DAILY. [place]. 12/01/13   Hendricks Limes, MD  ONE [facility] ULTRA TEST test strip USE TO TEST BLOOD SUGAR ONCE DAILY AS DIRECTED (DX 250.02) 12/01/13   Hendricks Limes, MD  ramipril (ALTACE) 10 MG capsule TAKE 1 CAPSULE BY MOUTH TWICE DAILY 11/13/13   Hendricks Limes, MD  sitaGLIPtin-metformin (JANUMET) 50-1000 MG per tablet Take 1 tablet by mouth daily with supper. 09/22/13   Hendricks Limes, MD  sotalol (BETAPACE) 120 MG tablet TAKE 1 TABLET (120 MG TOTAL) BY MOUTH 2 (TWO) TIMES DAILY. 12/01/13   Hendricks Limes, MD   Triage Vitals: BP 166/89  Pulse 81  Temp(Src) 99 F  (37.2 C) (Oral)  Resp 20  Ht 5\' 11"  (1.803 m)  Wt 250 lb (113.399 kg)  BMI 34.88 kg/m2  SpO2 99%   Physical Exam  General: Well-developed, well-nourished male in no acute distress; appearance consistent with age of record HENT: normocephalic; atraumatic; nasal congestion noted; no pharyngeal exudate or erythema Eyes: pupils equal, round and reactive to light; extraocular muscles intact Neck: supple Heart: regular rate and rhythm; no murmurs, rubs or gallops Chest: defibrillator L upper chest Lungs: clear to auscultation bilaterally but frequent cough Abdomen: soft; nondistended; nontender; no masses or hepatosplenomegaly; bowel sounds present Extremities: No deformity; full range of motion; pulses normal Neurologic: Awake, alert and oriented; motor function intact in all extremities and symmetric; no facial droop Skin: Warm and dry Psychiatric: Normal mood and affect     ED Course  Procedures (including critical care time)  DIAGNOSTIC STUDIES: Oxygen Saturation is 99% on RA, Normal by my interpretation.    COORDINATION OF CARE: 2:56 AM- Will order chest 2 view. Discussed treatment plan with pt at bedside and pt agreed to plan.     MDM  Nursing notes and vitals signs, including pulse oximetry, reviewed.  Summary of this visit's results, reviewed by myself:  Labs:  No results found for this or any previous visit (from the past 24 hour(s)).  Imaging Studies: Dg Chest 2 View  12/06/2013   CLINICAL DATA:  Cough and wheezing for 4 days.  EXAM: CHEST  2 VIEW  COMPARISON:  Fat 06/09/2012  FINDINGS: Cardiac pacemaker. The heart size and mediastinal contours are within normal limits. Both lungs are clear. The visualized skeletal structures are unremarkable.  IMPRESSION: No active cardiopulmonary disease.   Electronically Signed   By: Lucienne Capers M.D.   On: 12/06/2013 22:50     Final diagnoses:  Acute bronchitis, unspecified organism   I personally performed the services  described in this documentation, which was scribed in my presence. The recorded information has been reviewed and is accurate.    Jesse Fines, MD 12/07/13 [id number]

## 2013-12-06 NOTE — Progress Notes (Signed)
   Subjective:    Patient ID: [name], male    DOB: 07-01-1950, 63 y.o.   MRN: [id number]  HPI    Review of Systems     Objective:   Physical Exam        Assessment & Plan:  No Show

## 2013-12-07 DIAGNOSIS — J209 Acute bronchitis, unspecified: Secondary | ICD-10-CM | POA: Diagnosis not present

## 2013-12-07 NOTE — ED Notes (Signed)
C/o cough x 1 week.

## 2013-12-08 ENCOUNTER — Encounter: Payer: Self-pay | Admitting: Internal Medicine

## 2013-12-08 ENCOUNTER — Ambulatory Visit (INDEPENDENT_AMBULATORY_CARE_PROVIDER_SITE_OTHER): Payer: Managed Care, Other (non HMO) | Admitting: Internal Medicine

## 2013-12-08 VITALS — BP 100/68 | HR 79 | Temp 98.3°F | Wt 243.0 lb

## 2013-12-08 DIAGNOSIS — J209 Acute bronchitis, unspecified: Secondary | ICD-10-CM

## 2013-12-08 DIAGNOSIS — E1159 Type 2 diabetes mellitus with other circulatory complications: Secondary | ICD-10-CM

## 2013-12-08 DIAGNOSIS — I10 Essential (primary) hypertension: Secondary | ICD-10-CM

## 2013-12-08 DIAGNOSIS — J208 Acute bronchitis due to other specified organisms: Secondary | ICD-10-CM

## 2013-12-08 DIAGNOSIS — E785 Hyperlipidemia, unspecified: Secondary | ICD-10-CM

## 2013-12-08 MED ORDER — CEFUROXIME AXETIL 500 MG PO TABS: 500.0000 mg | ORAL_TABLET | Freq: Two times a day (BID) | ORAL | Status: DC

## 2013-12-08 MED ORDER — ATORVASTATIN CALCIUM 20 MG PO TABS: ORAL_TABLET | ORAL | Status: DC

## 2013-12-08 NOTE — Progress Notes (Signed)
Pre visit review using our clinic review tool, if applicable. No additional management support is needed unless otherwise documented below in the visit note. 

## 2013-12-08 NOTE — Progress Notes (Signed)
   Subjective:    Patient ID: [name], male    DOB: 1950/11/06, 63 y.o.   MRN: [id number]  HPI He is here for followup of his diabetes and dyslipidemia.  His A1c is 7.3 which is stable. This would correlate with an average sugar of 183 and 46% increased risk. These data were discussed &  shared with him in writing . His LDL is 44 on atorvastatin 20 mg daily.  He has been compliant with his diabetic and cholesterol medications  Fasting blood sugars range 107-136. Highest glucose 2 hours after meal is less than 180. Average has been approximately 155.  He's had one episode where his glucose was 97 without definite signs of hypoglycemia. He does have some weakness and blurred vision before the evening meal on occasion  He describes polyuria but relates this to his prostate disease for which he sees [name].  He is on no specific diet. He does not exercise  His weight has been variable.  He has chronic numbness, tingling, and burning in his feet.  He describes some memory issues. In fact he he did not keep his scheduled appointment 7/24.      Review of Systems  Polyphagia, polydipsia absent. There is no double vision or loss of vision.  No nonhealing skin lesions present.     He was seen the evening of 7/24  @ urgent care for bronchitis. Chest x-ray revealed no active process.  He describes the production of green sputum.  He has intermittent chest pain which has been discussed with his cardiologist.  Specifically denied are   palpitations, dyspnea, or claudication.  Significant abdominal symptoms or myalgias not present.  Frontal headache, facial pain , nasal purulence, dental pain, sore throat , otic pain or otic discharge denied. No fever , chills or sweats.   Objective:   Physical Exam Significant or distinguishing  findings on physical exam include:  As per CDC Guidelines ,Epic documents obesity as being present . Head shaven; he has a moustache. He has  scattered coarse rhonchi without increased work of breathing. Abdomen is protuberant.  General appearance :adequately nourished; in no distress. Eyes: No conjunctival inflammation or scleral icterus is present. Oral exam: Dental hygiene is good. Lips and gums are healthy appearing.There is no oropharyngeal erythema or exudate noted.  Ears/nose: negative  Heart:  Normal rate and regular rhythm. S1 and S2 normal without gallop, murmur, click, rub or other extra sounds   Abdomen: bowel sounds normal, soft and non-tender without masses, organomegaly or hernias noted.  No guarding or rebound.  Skin:Warm & dry.  Intact without suspicious lesions or rashes ; no jaundice or tenting Lymphatic: No lymphadenopathy is noted about the head, neck, axilla         Assessment & Plan:  #1 suboptimally controlled diabetes. He is not exercising nor following diet. He expressed desire to avoid additional meds & even to decrease # of meds presently taking.I explained this is not possible w/o addressing TLC deficits.These will be implemented with repeat A1c in 4 months. At that time [name] will be added if he is not goal.  #2 dyslipidemia LDL is only 44. Lovastatin will be decreased to one daily except one half on Tuesday, Thursday, Saturday. Lipids to be repeated in 4 months as well.  #3 bronchitis; generic Ceftin will be added as he was not placed on antibiotics in urgent care. Zpack not an option due to Sotalol Rx with QT prolongation potential with this combination.

## 2013-12-08 NOTE — Patient Instructions (Addendum)
Eat a low-fat diet with lots of fruits and vegetables, up to 7-9 servings per day. Consume less than 40   Grams (preferably ZERO) of sugar per day from foods & drinks with High Fructose Corn Syrup (HFCS) sugar as #1,2,3 or # 4 on label.[employer], [employer] do not carry products with HFCS. Follow a  low carb nutrition program such as [place] or [employer] Sugar [employer]usters  to prevent Diabetes progression . White carbohydrates (potatoes, rice, bread, and pasta) have a high spike of sugar and a high load of sugar. For example a  baked potato has a cup of sugar and a  french fry  2 teaspoons of sugar. Yams, wild  rice, whole grained bread &  wheat pasta have been much lower spike and load of  sugar. Portions should be the size of a deck of cards or your palm.  Cardiovascular exercise, this can be as simple a program as walking, is recommended 30-45 minutes 3-4 times per week. If you're not exercising you should take 6-8 weeks to build up to this level.   Recheck A1c & lipids in 3-4 months.

## 2013-12-11 ENCOUNTER — Other Ambulatory Visit: Payer: Self-pay

## 2013-12-11 MED ORDER — OMEPRAZOLE 20 MG PO CPDR: DELAYED_RELEASE_CAPSULE | ORAL | Status: DC

## 2013-12-11 NOTE — Telephone Encounter (Signed)
90 day supply requested

## 2014-01-22 ENCOUNTER — Telehealth: Payer: Self-pay | Admitting: Internal Medicine

## 2014-01-22 MED ORDER — GLUCOSE BLOOD VI STRP: ORAL_STRIP | Status: DC

## 2014-01-22 NOTE — Telephone Encounter (Signed)
Patient has told pharmacy that new directions on One touch test strips should be twice a day.  Pharmacy is needing new script with diagnosis code sent over.  [facility].

## 2014-01-22 NOTE — Telephone Encounter (Signed)
SENT UPDATED SCRIPT TO [facility].../[facility]

## 2014-02-15 ENCOUNTER — Other Ambulatory Visit: Payer: Self-pay

## 2014-02-15 MED ORDER — DIGOXIN 125 MCG PO TABS
0.1250 mg | ORAL_TABLET | Freq: Every day | ORAL | Status: DC
Start: 2014-02-15 — End: 2014-06-15

## 2014-02-16 ENCOUNTER — Encounter: Payer: Self-pay | Admitting: *Deleted

## 2014-02-23 ENCOUNTER — Other Ambulatory Visit: Payer: Self-pay

## 2014-02-27 ENCOUNTER — Ambulatory Visit (INDEPENDENT_AMBULATORY_CARE_PROVIDER_SITE_OTHER): Payer: Managed Care, Other (non HMO) | Admitting: *Deleted

## 2014-02-27 DIAGNOSIS — I5022 Chronic systolic (congestive) heart failure: Secondary | ICD-10-CM

## 2014-02-27 DIAGNOSIS — I429 Cardiomyopathy, unspecified: Secondary | ICD-10-CM

## 2014-02-27 DIAGNOSIS — I428 Other cardiomyopathies: Secondary | ICD-10-CM

## 2014-02-27 LAB — MDC_IDC_ENUM_SESS_TYPE_INCLINIC
Battery Remaining Longevity: 129 mo
Brady Statistic RV Percent Paced: 0.01 %
Date Time Interrogation Session: 20151020161419
HIGH POWER IMPEDANCE MEASURED VALUE: 60 Ohm
HighPow Impedance: 171 Ohm
Lead Channel Impedance Value: 475 Ohm
Lead Channel Pacing Threshold Pulse Width: 0.4 ms
Lead Channel Setting Pacing Amplitude: 2 V
Lead Channel Setting Pacing Pulse Width: 0.4 ms
MDC IDC MSMT BATTERY VOLTAGE: 3.03 V
MDC IDC MSMT LEADCHNL RV PACING THRESHOLD AMPLITUDE: 0.5 V
MDC IDC MSMT LEADCHNL RV SENSING INTR AMPL: 7.375 mV
MDC IDC MSMT LEADCHNL RV SENSING INTR AMPL: 8 mV
MDC IDC SET LEADCHNL RV SENSING SENSITIVITY: 0.3 mV
MDC IDC SET ZONE DETECTION INTERVAL: 290 ms
MDC IDC SET ZONE DETECTION INTERVAL: 370 ms
Zone Setting Detection Interval: 240 ms
Zone Setting Detection Interval: 450 ms

## 2014-02-27 NOTE — Progress Notes (Signed)
ICD check in clinic. Normal device function. Thresholds and sensing consistent with previous device measurements. Impedance trends stable over time. 61 NSVT episodes, 1-2 seconds.  Optivol and thoracic impedance normal.    Histogram distribution appropriate for patient and level of activity. No changes made this session. Device programmed at appropriate safety margins. Device programmed to optimize intrinsic conduction. Estimated longevity 10.7 years.  Patient education completed including shock plan. Alert tones/vibration demonstrated for patient.  ROV 3 months with the device clinic.

## 2014-03-21 ENCOUNTER — Encounter: Payer: Self-pay | Admitting: Internal Medicine

## 2014-03-26 ENCOUNTER — Encounter: Payer: Self-pay | Admitting: Internal Medicine

## 2014-04-03 ENCOUNTER — Other Ambulatory Visit (INDEPENDENT_AMBULATORY_CARE_PROVIDER_SITE_OTHER): Payer: Managed Care, Other (non HMO)

## 2014-04-03 DIAGNOSIS — E1159 Type 2 diabetes mellitus with other circulatory complications: Secondary | ICD-10-CM

## 2014-04-03 DIAGNOSIS — E785 Hyperlipidemia, unspecified: Secondary | ICD-10-CM

## 2014-04-03 DIAGNOSIS — E1165 Type 2 diabetes mellitus with hyperglycemia: Secondary | ICD-10-CM

## 2014-04-03 DIAGNOSIS — I70209 Unspecified atherosclerosis of native arteries of extremities, unspecified extremity: Secondary | ICD-10-CM

## 2014-04-03 DIAGNOSIS — IMO0002 Reserved for concepts with insufficient information to code with codable children: Secondary | ICD-10-CM

## 2014-04-03 DIAGNOSIS — E1151 Type 2 diabetes mellitus with diabetic peripheral angiopathy without gangrene: Secondary | ICD-10-CM

## 2014-04-03 LAB — LIPID PANEL
CHOLESTEROL: 81 mg/dL (ref 0–200)
HDL: 26.4 mg/dL — ABNORMAL LOW (ref >39.00)
LDL CALC: 41 mg/dL (ref 0–99)
NonHDL: 54.6
TRIGLYCERIDES: 67 mg/dL (ref 0.0–149.0)
Total CHOL/HDL Ratio: 3
VLDL: 13.4 mg/dL (ref 0.0–40.0)

## 2014-04-03 LAB — HEMOGLOBIN A1C: HEMOGLOBIN A1C: 6.7 % — AB (ref 4.6–6.5)

## 2014-04-03 LAB — MICROALBUMIN / CREATININE URINE RATIO
CREATININE, U: 224.3 mg/dL
MICROALB UR: 1.2 mg/dL (ref 0.0–1.9)
Microalb Creat Ratio: 0.5 mg/g (ref 0.0–30.0)

## 2014-04-04 ENCOUNTER — Telehealth: Payer: Self-pay | Admitting: Internal Medicine

## 2014-04-04 NOTE — Telephone Encounter (Signed)
Patient calling for test results. °

## 2014-04-11 ENCOUNTER — Encounter: Payer: Self-pay | Admitting: Internal Medicine

## 2014-04-12 ENCOUNTER — Other Ambulatory Visit: Payer: Self-pay

## 2014-04-12 ENCOUNTER — Telehealth: Payer: Self-pay | Admitting: *Deleted

## 2014-04-12 MED ORDER — CARVEDILOL 12.5 MG PO TABS: ORAL_TABLET | ORAL | Status: DC

## 2014-04-12 NOTE — Telephone Encounter (Signed)
I am not sure as I am covering only for Dr Linna Darner who I believe is PCP  I will forward to Dr Taylor/card who may be able to help

## 2014-04-12 NOTE — Telephone Encounter (Signed)
Dr. Lovena Le pls advise on msg below...Johny Chess

## 2014-04-12 NOTE — Telephone Encounter (Signed)
Pharmacist called nad stated received electronic script for generic coreg (beta blocker), but pt is also taking sotalol which is also a bleta blocker. Wanting to know should pt be taking both. MD is out of office until Monday. Pls advise on msg...Johny Chess

## 2014-04-16 NOTE — Telephone Encounter (Signed)
[name] , the issue is dual Beta blockade. Apparently you Rx Betapace. Does he need both or can be D/c Carvedilol & monitor BP? Thanks , [employer]

## 2014-04-16 NOTE — Telephone Encounter (Signed)
Dr. Linna Darner this is your pt can you respond to the msg below from the pharmacist. Does pt need to take both beta blockers...[name]

## 2014-04-19 ENCOUNTER — Encounter (HOSPITAL_COMMUNITY): Payer: Self-pay | Admitting: Internal Medicine

## 2014-04-26 NOTE — Telephone Encounter (Signed)
Ok to stop coreg but I suspect he will need another bp lowering drug. GT

## 2014-05-25 ENCOUNTER — Encounter: Payer: Self-pay | Admitting: Internal Medicine

## 2014-05-25 ENCOUNTER — Ambulatory Visit (INDEPENDENT_AMBULATORY_CARE_PROVIDER_SITE_OTHER): Payer: Managed Care, Other (non HMO) | Admitting: Internal Medicine

## 2014-05-25 VITALS — BP 126/74 | HR 72 | Ht 70.0 in | Wt 227.2 lb

## 2014-05-25 DIAGNOSIS — K2270 Barrett's esophagus without dysplasia: Secondary | ICD-10-CM

## 2014-05-25 DIAGNOSIS — Z86010 Personal history of colonic polyps: Secondary | ICD-10-CM

## 2014-05-25 NOTE — Patient Instructions (Signed)
You will be notified to come back and see [place] in a year.   I appreciate the opportunity to care for you. Silvano Rusk,, M.D., [facility]

## 2014-05-25 NOTE — Progress Notes (Signed)
Subjective:    Patient ID: [name], male    DOB: 02-07-1951, 64 y.o.   MRN: [id number]  HPI  64 y/o male presents to office to follow up for Barrett's Esophagus.  No complaints of dysphagia, heartburn, or N/V.  States he is having normal bowel movements w/o complaints of diarrhea or constipation.    Remains on PPI.  Last endoscopy was 02/2011.  Last colonoscopy 2012.  No Known Allergies Outpatient Prescriptions Prior to Visit  Medication Sig Dispense Refill  . atorvastatin (LIPITOR) 20 MG tablet TAKE 1 TABLET (20 MG TOTAL) BY MOUTH DAILY EXCEPT 1/2 PILL T,Th,& Sat 90 tablet 3  . carvedilol (COREG) 12.5 MG tablet TAKE 1 & 1/2 TABLETS TWICE A DAY WITH MEALS 270 tablet 1  . digoxin (LANOXIN) 0.125 MG tablet Take 1 tablet (0.125 mg total) by mouth daily. 30 tablet 3  . glucose blood (ONE TOUCH ULTRA TEST) test strip USE TO TEST BLOOD SUGAR TWICE A DAY (DX 250.02) 60 each 11  . omeprazole (PRILOSEC) 20 MG capsule TAKE 1 CAPSULE (20MG  TOTAL) BY MOUTH DAILY. [place]. 90 capsule 1  . ramipril (ALTACE) 10 MG capsule TAKE 1 CAPSULE BY MOUTH TWICE DAILY 180 capsule 3  . sitaGLIPtin-metformin (JANUMET) 50-1000 MG per tablet Take 1 tablet by mouth daily with supper. 90 tablet 3  . sotalol (BETAPACE) 120 MG tablet TAKE 1 TABLET (120 MG TOTAL) BY MOUTH 2 (TWO) TIMES DAILY. 180 tablet 1  . clonazePAM (KLONOPIN) 0.5 MG tablet 1 nightly if RLS symptoms prevent sleep (Patient not taking: Reported on 05/25/2014) 30 tablet 2  . HYDROcodone-homatropine (HYDROMET) 5-1.5 MG/5ML syrup Take 5 mLs by mouth every 6 (six) hours as needed for cough. 120 mL 0   No facility-administered medications prior to visit.   Past Medical History  Diagnosis Date  . Ventricular tachycardia     a. s/p ICD (generator change 09/2012). b. 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 and placed on sotalol  . Hypertension   . Diabetes mellitus   . NICM (nonischemic cardiomyopathy)     a.normal cors by cath in 2005. b. s/p Medtronic  ICD.  [name] Prostate cancer     s/p prostatectomy  . Sleep apnea   . Hyperlipidemia   . Chronic systolic heart failure   . Anxiety     since defibrillator placement  . Arthritis     knees  . Cataract   . GERD (gastroesophageal reflux disease)   . Colon polyps     tubular adenoma   Past Surgical History  Procedure Laterality Date  . Cardiac defibrillator placement  03/11/2004    Medtronic Maximo single lead  . Prostatectomy      Dr [name]  . Rectal surgery    . Upper gastrointestinal endoscopy  08/2004, 02/24/11    Barrett's esophagus  . Colonoscopy w/ biopsies and polypectomy  08/2004, 02/24/11    44mm adenoma in 2006,  5 mm polyp (not recovered) 2012  . Implantable cardioverter defibrillator generator change N/A 10/07/2012    Procedure: IMPLANTABLE CARDIOVERTER DEFIBRILLATOR GENERATOR CHANGE;  Surgeon: Evans Lance, MD;  Location: [facility] CATH LAB;  Service: Cardiovascular;  Laterality: N/A;   History   Social History  . Marital Status: Married    Spouse Name: N/A    Number of Children: 2  . 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 Smoking status: Never Smoker   . Smokeless  tobacco: Never Used  . Alcohol Use: No  . Drug Use: No  . Sexual Activity: None   Other Topics Concern  . None   Social History Narrative   Family History  Problem Relation Age of Onset  . Coronary artery disease Father   . Prostate cancer Father   . Hypertension Mother   . Diabetes Paternal Grandmother   . Stroke Neg Hx         Review of Systems  Constitutional: Negative for fever, appetite change and fatigue.  Gastrointestinal: Negative for nausea, vomiting, abdominal pain, diarrhea, constipation, blood in stool, abdominal distention and rectal pain.  Genitourinary: Negative for dysuria, urgency, hematuria and difficulty urinating.  Neurological: Negative for light-headedness and headaches.        Objective:   Physical  Exam  General: Well developed, well nourished. In no apparent distress. HEENT: Anicteic Sclera, No pharyngeal erythema or exudates  Neck: Supple, no JVD, no masses  Cardiovascular: RRR, S1 S2 auscultated, no rubs, murmurs or gallops.  Respiratory: Clear to auscultation bilaterally with equal chest rise  Abdomen: No tenderness to palpation,  + BS, no masses.; No guarding or peritoneal signs Extremities: warm dry without cyanosis clubbing.  Neuro: AAOx3, cranial nerves grossly intact.  Skin: Without rashes exudates or nodules.  Psych: Normal affect and demeanor with intact judgement and insight      Assessment & Plan:   1. Barrett's esophagus   2. Hx of colonic polyps     1)  Following up for Barrett's: Patient has no symptoms.  Recommending to wait another 2 years before doing another endoscopy.  Follow up in office in 1 year to reevaluate. 2)  Hx of adenomatous colonic polyp: Next colonoscopy to be scheduled in 2019.  [name], PA-S 05/25/2014  I have seen the patient with the physician assistant student made modifications to the note, and agree with above.

## 2014-05-28 ENCOUNTER — Ambulatory Visit (INDEPENDENT_AMBULATORY_CARE_PROVIDER_SITE_OTHER): Payer: Managed Care, Other (non HMO) | Admitting: *Deleted

## 2014-05-28 ENCOUNTER — Other Ambulatory Visit: Payer: Self-pay

## 2014-05-28 DIAGNOSIS — I428 Other cardiomyopathies: Secondary | ICD-10-CM

## 2014-05-28 DIAGNOSIS — I5022 Chronic systolic (congestive) heart failure: Secondary | ICD-10-CM

## 2014-05-28 DIAGNOSIS — I429 Cardiomyopathy, unspecified: Secondary | ICD-10-CM

## 2014-05-28 LAB — MDC_IDC_ENUM_SESS_TYPE_INCLINIC
Battery Remaining Longevity: 127 mo
Battery Voltage: 3.03 V
Brady Statistic RV Percent Paced: 0.01 %
Date Time Interrogation Session: 20160118093306
HIGH POWER IMPEDANCE MEASURED VALUE: 190 Ohm
HIGH POWER IMPEDANCE MEASURED VALUE: 63 Ohm
Lead Channel Impedance Value: 475 Ohm
Lead Channel Pacing Threshold Amplitude: 0.5 V
Lead Channel Pacing Threshold Pulse Width: 0.4 ms
Lead Channel Sensing Intrinsic Amplitude: 7.125 mV
Lead Channel Setting Pacing Amplitude: 2.5 V
Lead Channel Setting Pacing Pulse Width: 0.4 ms
Lead Channel Setting Sensing Sensitivity: 0.3 mV
MDC IDC MSMT LEADCHNL RV SENSING INTR AMPL: 8.75 mV
MDC IDC SET ZONE DETECTION INTERVAL: 240 ms
MDC IDC SET ZONE DETECTION INTERVAL: 370 ms
Zone Setting Detection Interval: 290 ms
Zone Setting Detection Interval: 450 ms

## 2014-05-28 MED ORDER — SOTALOL HCL 120 MG PO TABS: ORAL_TABLET | ORAL | Status: DC

## 2014-05-28 NOTE — Progress Notes (Signed)
ICD check in clinic. Normal device function. Threshold and sensing consistent with previous device measurements. Impedance trends stable over time. 2 NSVT episodes---max dur. 27 bts, Max Avg V 222---both on 11/12. Histogram distribution appropriate for patient and level of activity. RV output increased to 2.5V. Device programmed to optimize intrinsic conduction. Stable thoracic impedance. Estimated longevity 10.5 years. Plan to follow up with GT on 4-26 @ 9:00am. Patient education completed including shock plan. Alert tones demonstrated for patient. Magnet given.

## 2014-06-08 ENCOUNTER — Encounter: Payer: Self-pay | Admitting: Internal Medicine

## 2014-06-15 ENCOUNTER — Other Ambulatory Visit: Payer: Self-pay | Admitting: Internal Medicine

## 2014-06-20 ENCOUNTER — Telehealth: Payer: Self-pay | Admitting: Internal Medicine

## 2014-06-20 NOTE — Telephone Encounter (Signed)
I was called by Dr. Tarri Glenn, the patient's dentist.  They felt he needed anti inflammatory medicine for a shortterm tooth situation.  I approved 4 days of oral antiinflammatory.

## 2014-06-20 NOTE — Telephone Encounter (Signed)
New Msg      Dr. Beverely Low calling, states pt is having trouble with tooth and will need an anti-inflammatory.   What can she prescribed for him to take?  Please contact office at [phone]

## 2014-07-03 ENCOUNTER — Telehealth: Payer: Self-pay | Admitting: Internal Medicine

## 2014-07-03 NOTE — Telephone Encounter (Signed)
This would be for someone in front staff.

## 2014-07-03 NOTE — Telephone Encounter (Signed)
Patient would like to know if he could have a list of his last copayment for last year. Please advise

## 2014-07-05 ENCOUNTER — Telehealth: Payer: Self-pay | Admitting: Internal Medicine

## 2014-07-05 ENCOUNTER — Other Ambulatory Visit: Payer: Self-pay | Admitting: Internal Medicine

## 2014-07-05 DIAGNOSIS — E1165 Type 2 diabetes mellitus with hyperglycemia: Principal | ICD-10-CM

## 2014-07-05 DIAGNOSIS — E1151 Type 2 diabetes mellitus with diabetic peripheral angiopathy without gangrene: Secondary | ICD-10-CM

## 2014-07-05 DIAGNOSIS — IMO0002 Reserved for concepts with insufficient information to code with codable children: Secondary | ICD-10-CM

## 2014-07-05 NOTE — Telephone Encounter (Signed)
Orders for labs entered ; come in any morning after 7:30 am after fasting for @ least 8 hours (nothing after midnight). Goals for home glucose monitoring are : fasting  or morning glucose goal of  90-150.  Please bring your sugar readings to an office visit after results back Last values in 11/15 were very good

## 2014-07-05 NOTE — Telephone Encounter (Signed)
Patient states blood sugar was 176 this morning when he woke up.  Then he ate cereal.  He checked it an hour later and it was 227.   Then 10 minutes later it was 174.  Patient just took a janumet.  Patient states he does not usually take this until the afternoon but went ahead and took.  Would like a call back in regards.

## 2014-07-05 NOTE — Telephone Encounter (Signed)
Phone call to patient and advised dr hopper's advice, patient will be coming for lab work and to schedule appointment to see dr hopper.  Patient states he will keep record of glucose am readings and bring to office visit.

## 2014-07-10 ENCOUNTER — Other Ambulatory Visit (INDEPENDENT_AMBULATORY_CARE_PROVIDER_SITE_OTHER): Payer: Managed Care, Other (non HMO)

## 2014-07-10 DIAGNOSIS — E1165 Type 2 diabetes mellitus with hyperglycemia: Principal | ICD-10-CM

## 2014-07-10 DIAGNOSIS — IMO0002 Reserved for concepts with insufficient information to code with codable children: Secondary | ICD-10-CM

## 2014-07-10 DIAGNOSIS — E1151 Type 2 diabetes mellitus with diabetic peripheral angiopathy without gangrene: Secondary | ICD-10-CM

## 2014-07-10 DIAGNOSIS — E1159 Type 2 diabetes mellitus with other circulatory complications: Secondary | ICD-10-CM

## 2014-07-10 LAB — HEMOGLOBIN A1C: Hgb A1c MFr Bld: 6.8 % — ABNORMAL HIGH (ref 4.6–6.5)

## 2014-07-13 ENCOUNTER — Other Ambulatory Visit: Payer: Self-pay | Admitting: Internal Medicine

## 2014-07-13 DIAGNOSIS — IMO0002 Reserved for concepts with insufficient information to code with codable children: Secondary | ICD-10-CM

## 2014-07-13 DIAGNOSIS — E1165 Type 2 diabetes mellitus with hyperglycemia: Principal | ICD-10-CM

## 2014-07-13 DIAGNOSIS — E1151 Type 2 diabetes mellitus with diabetic peripheral angiopathy without gangrene: Secondary | ICD-10-CM

## 2014-07-13 LAB — FRUCTOSAMINE: Fructosamine: 247 umol/L (ref 190–270)

## 2014-07-15 ENCOUNTER — Other Ambulatory Visit: Payer: Self-pay | Admitting: Internal Medicine

## 2014-07-17 ENCOUNTER — Encounter: Payer: Self-pay | Admitting: Internal Medicine

## 2014-07-17 ENCOUNTER — Ambulatory Visit (INDEPENDENT_AMBULATORY_CARE_PROVIDER_SITE_OTHER): Payer: Managed Care, Other (non HMO) | Admitting: Internal Medicine

## 2014-07-17 VITALS — BP 162/86 | HR 76 | Temp 98.2°F | Resp 14 | Ht 70.0 in | Wt 224.5 lb

## 2014-07-17 DIAGNOSIS — E1159 Type 2 diabetes mellitus with other circulatory complications: Secondary | ICD-10-CM | POA: Diagnosis not present

## 2014-07-17 DIAGNOSIS — I10 Essential (primary) hypertension: Secondary | ICD-10-CM | POA: Diagnosis not present

## 2014-07-17 DIAGNOSIS — E1151 Type 2 diabetes mellitus with diabetic peripheral angiopathy without gangrene: Secondary | ICD-10-CM

## 2014-07-17 DIAGNOSIS — E1165 Type 2 diabetes mellitus with hyperglycemia: Principal | ICD-10-CM

## 2014-07-17 DIAGNOSIS — IMO0002 Reserved for concepts with insufficient information to code with codable children: Secondary | ICD-10-CM

## 2014-07-17 NOTE — Progress Notes (Signed)
   Subjective:    Patient ID: [name], male    DOB: 08-Dec-1950, 64 y.o.   MRN: [id number]  HPI He is to follow-up of his diabetes. There is discrepancy between some of the fasting glucoses; in particular an isolated FBS was 224. He now admits that he had eaten between 1-2 AM the eve before. His other glucoses have ranged 88-181.  A1c was 6.8 and fructosamine was 247 ( normal less than 270) . This suggests his is glucometer is correct & extrene hyperglycemia was related to change in diet.  He did lose 30 pounds with nutrition change &  exercise but he has stopped exercising . Ophthalmology exam is due. Podiatry exam is also overdue.  Other than occasional blurred vision he has no positive review of symptoms related to the diabetes  Review of Systems  Polyuria, polyphagia, polydipsia absent.  There is no double vision or loss of vision.   No postural dizziness noted. Denied are numbness, tingling, or burning of the extremities.  No nonhealing skin lesions present.  Weight has started to rise off exercise      Objective:   Physical Exam Pertinent or positive findings include: Head is shaven. There is prominence of the lower lids.  There is hyperpigmentation in a linear vertical pattern of the great nails  Gen.: Healthy  & well-nourished, appropriate and alert, weight Eyes: No conjunctival changes, extraocular motion intact Neck: Normal range of motion, thyroid normal Respiratory: No increased work of breathing or abnormal breath sounds Cardiac : regular rhythm, no extra heart sounds, gallop, murmur Abdomen: No organomegaly ,masses, bruits or aortic enlargement Lymph: No lymphadenopathy of the neck or axilla Skin: No rashes, lesions, ulcers or ischemic changes Muscle skeletal: no nail changes; joints normal Vasc:All pulses intact, no bruits present Neuro: Normal deep tendon reflexes, alert & oriented, sensation over feet intact Psych: judgment and insight, mood and affect  normal        Assessment & Plan:  #1 adequate diabetes control; isolated hyperglycemia represents dietary change rather than invalid glucometer readings as A1c & Fructosamine correlate. #2 HTN See AVS

## 2014-07-17 NOTE — Progress Notes (Signed)
Pre visit review using our clinic review tool, if applicable. No additional management support is needed unless otherwise documented below in the visit note. 

## 2014-07-17 NOTE — Patient Instructions (Addendum)
  The following nutritional changes may help prevent Diabetes progression & complications.  White carbohydrates (potatoes, rice, bread, and pasta) cause a high spike of the sugar level which stays elevated for a significant period of time (called sugar"load").  For example a  baked potato has a cup of sugar and a  french fry  2 teaspoons of sugar.  More complex carbs such as yams, wild  rice, whole grained bread &  wheat pasta have been much lower spike and persistent load of sugar than the white carbs. The pancreas excretes excess insulin in response to the high spike & load of sugar . Over time the pancreas can actually run out of insulin necessitating insulin shots. Cardiovascular exercise, this can be as simple a program as walking, is recommended 30-45 minutes 3-4 times per week. If you're not exercising you should take 6-8 weeks to build up to this level.   Minimal Blood Pressure Goal= AVERAGE < 140/90;  Ideal is an AVERAGE < 135/85. This AVERAGE should be calculated from @ least 5-7 BP readings taken @ different times of day on different days of week. You should not respond to isolated BP readings , but rather the AVERAGE for that week .Please bring your  blood pressure cuff to office visits to verify that it is reliable.It  can also be checked against the blood pressure device at the pharmacy. Finger or wrist cuffs are not dependable; an arm cuff is.

## 2014-07-18 ENCOUNTER — Telehealth: Payer: Self-pay

## 2014-07-18 ENCOUNTER — Encounter: Payer: Self-pay | Admitting: Internal Medicine

## 2014-07-18 NOTE — Telephone Encounter (Signed)
-----   Message from Hendricks Limes, MD sent at 07/18/2014  6:18 AM EST ----- BP yesterday was 162/86; goal = < 140/90 on average. Calculate AVERAGE  from @ least 5-7 BP readings taken @ different times of day on different days of week.  Call if BP not @ goal over next 10-14 days

## 2014-07-18 NOTE — Telephone Encounter (Signed)
Patient has been advised

## 2014-09-03 ENCOUNTER — Other Ambulatory Visit: Payer: Self-pay

## 2014-09-04 ENCOUNTER — Encounter: Payer: Self-pay | Admitting: Internal Medicine

## 2014-09-04 ENCOUNTER — Ambulatory Visit (INDEPENDENT_AMBULATORY_CARE_PROVIDER_SITE_OTHER): Payer: Managed Care, Other (non HMO) | Admitting: Internal Medicine

## 2014-09-04 VITALS — BP 148/74 | HR 77 | Ht 71.0 in | Wt 227.8 lb

## 2014-09-04 DIAGNOSIS — I472 Ventricular tachycardia, unspecified: Secondary | ICD-10-CM

## 2014-09-04 DIAGNOSIS — I429 Cardiomyopathy, unspecified: Secondary | ICD-10-CM | POA: Diagnosis not present

## 2014-09-04 DIAGNOSIS — Z95810 Presence of automatic (implantable) cardiac defibrillator: Secondary | ICD-10-CM

## 2014-09-04 DIAGNOSIS — I5022 Chronic systolic (congestive) heart failure: Secondary | ICD-10-CM | POA: Diagnosis not present

## 2014-09-04 DIAGNOSIS — I428 Other cardiomyopathies: Secondary | ICD-10-CM

## 2014-09-04 DIAGNOSIS — I10 Essential (primary) hypertension: Secondary | ICD-10-CM

## 2014-09-04 LAB — MDC_IDC_ENUM_SESS_TYPE_INCLINIC
Brady Statistic RV Percent Paced: 0.01 %
Date Time Interrogation Session: 20160426090618
HIGH POWER IMPEDANCE MEASURED VALUE: 171 Ohm
HIGH POWER IMPEDANCE MEASURED VALUE: 63 Ohm
Lead Channel Pacing Threshold Amplitude: 0.5 V
Lead Channel Sensing Intrinsic Amplitude: 8 mV
Lead Channel Setting Pacing Pulse Width: 0.4 ms
Lead Channel Setting Sensing Sensitivity: 0.3 mV
MDC IDC MSMT BATTERY REMAINING LONGEVITY: 125 mo
MDC IDC MSMT BATTERY VOLTAGE: 3.03 V
MDC IDC MSMT LEADCHNL RV IMPEDANCE VALUE: 475 Ohm
MDC IDC MSMT LEADCHNL RV PACING THRESHOLD PULSEWIDTH: 0.4 ms
MDC IDC SET LEADCHNL RV PACING AMPLITUDE: 2.5 V
Zone Setting Detection Interval: 240 ms
Zone Setting Detection Interval: 290 ms
Zone Setting Detection Interval: 370 ms
Zone Setting Detection Interval: 450 ms

## 2014-09-04 NOTE — Assessment & Plan Note (Signed)
His Medtronic ICD has been interrogated today and found to be working normally. We'll plan to recheck in several months.

## 2014-09-04 NOTE — Patient Instructions (Signed)
Medication Instructions:  Your physician recommends that you continue on your current medications as directed. Please refer to the Current Medication list given to you today.   Labwork: None ordered  Testing/Procedures: None ordered  Follow-Up: Your physician wants you to follow-up in: 12 months with Dr Knox Saliva will receive a reminder letter in the mail two months in advance. If you don't receive a letter, please call our office to schedule the follow-up appointment.  Remote monitoring is used to monitor your Pacemaker or ICD from home. This monitoring reduces the number of office visits required to check your device to one time per year. It allows [place] to keep an eye on the functioning of your device to ensure it is working properly. You are scheduled for a device check from home on 12/04/14. You may send your transmission at any time that day. If you have a wireless device, the transmission will be sent automatically. After your physician reviews your transmission, you will receive a postcard with your next transmission date.    Any Other Special Instructions Will Be Listed Below (If Applicable).

## 2014-09-04 NOTE — Assessment & Plan Note (Signed)
He has had no recurrent sustained VT. I considered increasing his sotalol dose today because of symptomatic PVCs, but decided to hold off on this for now.

## 2014-09-04 NOTE — Assessment & Plan Note (Signed)
His symptoms remain class IIa. He will continue his current medications, and I've encouraged him to maintain a low-sodium diet.

## 2014-09-04 NOTE — Progress Notes (Signed)
HPI [name] returns today for followup. He is a 64 yo man with a long standing non-ischemic CM, s/p ICD implantation, with a h/o VT, HTN, and chronic class 2 systolic heart failure. In the interim, he has been stable. He continues to work more than 40 hours a week, and is much is 90 hours a week. He would like to start back playing tennis. He has had no ICD shocks. He notes an occasional sensation like his heart is beating in reverse. He denies alcohol, tobacco, sodium, or other abuses. No Known Allergies   Current Outpatient Prescriptions  Medication Sig Dispense Refill  . atorvastatin (LIPITOR) 20 MG tablet Take 1 tablet by mouth on Sun, Mon, Wed & Fri and take 1/2 tablet by mouth on Tues, Thurs & Sat    . carvedilol (COREG) 12.5 MG tablet TAKE 1 & 1/2 TABLETS TWICE A DAY WITH MEALS (Patient taking differently: TAKE 1 & 1/2 TABLETS BY MOUTH TWICE A DAY WITH MEALS) 270 tablet 1  . clonazePAM (KLONOPIN) 0.5 MG tablet 1 nightly if RLS symptoms prevent sleep (Patient taking differently: Take 1 tablet by mouth nightly if RLS symptoms prevent sleep) 30 tablet 2  . digoxin (LANOXIN) 0.125 MG tablet TAKE 1 TABLET BY MOUTH EVERY DAY 30 tablet 3  . glucose blood (ONE TOUCH ULTRA TEST) test strip USE TO TEST BLOOD SUGAR TWICE A DAY (DX 250.02) 60 each 11  . omeprazole (PRILOSEC) 20 MG capsule TAKE ONE CAPSULE BY MOUTH DAILY 30 MINUTES BEFORE BREAKFAST 90 capsule 1  . ramipril (ALTACE) 10 MG capsule TAKE 1 CAPSULE BY MOUTH TWICE DAILY 180 capsule 3  . sitaGLIPtin-metformin (JANUMET) 50-1000 MG per tablet Take 1 tablet by mouth daily with supper. 90 tablet 3  . sotalol (BETAPACE) 120 MG tablet TAKE 1 TABLET (120 MG TOTAL) BY MOUTH 2 (TWO) TIMES DAILY. 180 tablet 1   No current facility-administered medications for this visit.     Past Medical History  Diagnosis Date  . Ventricular tachycardia     a. s/p ICD (generator change 09/2012). b. 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 and placed on sotalol  .  Hypertension   . Diabetes mellitus   . NICM (nonischemic cardiomyopathy)     a.normal cors by cath in 2005. b. s/p Medtronic ICD.  [name] Prostate cancer     s/p prostatectomy  . Sleep apnea   . Hyperlipidemia   . Chronic systolic heart failure   . Anxiety     since defibrillator placement  . Arthritis     knees  . Cataract   . GERD (gastroesophageal reflux disease)   . Colon polyps     tubular adenoma    ROS:   All systems reviewed and negative except as noted in the HPI.   Past Surgical History  Procedure Laterality Date  . Cardiac defibrillator placement  03/11/2004    Medtronic Maximo single lead  . Prostatectomy      [name]  . Rectal surgery    . Upper gastrointestinal endoscopy  08/2004, 02/24/11    Barrett's esophagus  . Colonoscopy w/ biopsies and polypectomy  08/2004, 02/24/11    11mm adenoma in 2006,  5 mm polyp (not recovered) 2012  . Implantable cardioverter defibrillator generator change N/A 10/07/2012    Procedure: IMPLANTABLE CARDIOVERTER DEFIBRILLATOR GENERATOR CHANGE;  Surgeon: Evans Lance, MD;  Location: [facility] CATH LAB;  Service: Cardiovascular;  Laterality: N/A;     Family History  Problem Relation Age of  Onset  . Coronary artery disease Father   . Prostate cancer Father   . Diabetes Father   . Hypertension Mother   . Diabetes Paternal Grandmother   . Stroke Neg Hx      History   Social History  . Marital Status: Married    Spouse Name: N/A  . Number of Children: 2  . 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 Smoking status: Never Smoker   . Smokeless tobacco: Never Used  . Alcohol Use: No  . Drug Use: No  . Sexual Activity: Not on file   Other Topics Concern  . Not on file   Social History Narrative     BP 148/74 mmHg  Pulse 77  Ht 5\' 11"  (1.803 m)  Wt 227 lb 12.8 oz (103.329 kg)  BMI 31.79 kg/m2  Physical Exam:  Well appearing 64 year old man, NAD HEENT:  Unremarkable Neck:  6 cm JVD, no thyromegally Lymphatics:  No adenopathy Back:  No CVA tenderness Lungs:  Clear, with no wheezes, rales, or rhonchi. Well-healed ICD incision. HEART:  Regular rate rhythm, no murmurs, no rubs, no clicks Abd:  soft, positive bowel sounds, no organomegally, no rebound, no guarding Ext:  2 plus pulses, no edema, no cyanosis, no clubbing Skin:  No rashes no nodules Neuro:  CN II through XII intact, motor grossly intact   DEVICE  Normal device function.  See PaceArt for details.   Assess/Plan:

## 2014-09-04 NOTE — Assessment & Plan Note (Signed)
His blood pressure is slightly elevated. I've encouraged the patient to maintain a low-sodium diet, and continue his regular exercise. He is encouraged to lose weight.

## 2014-09-05 ENCOUNTER — Other Ambulatory Visit: Payer: Self-pay

## 2014-09-05 MED ORDER — SITAGLIPTIN PHOS-METFORMIN HCL 50-1000 MG PO TABS: 1.0000 | ORAL_TABLET | Freq: Every day | ORAL | Status: DC

## 2014-09-26 ENCOUNTER — Encounter: Payer: Self-pay | Admitting: Radiation Oncology

## 2014-09-26 NOTE — Progress Notes (Addendum)
GU Location of Tumor / Histology: Adenocarcinoma of the Prostate  If Prostate Cancer, Gleason Score is (6 + 6) and PSA is (4.06) - 2004 Prostate Biospy  [name] presented with a rising PSA S/P Prostatectomy in 2005, Stage T2c.  His PSA was 0.21 in April 2015, and was 0.26 on 09/03/14  Biopsies of Prostate (if applicable) revealed:   06/29/2003  1. Lymph Node, Left Pelvis, Regional Resection:  Two Lymph Nodes Negative For Tumor  2.  Lymph Node, Right Pelvic, Regional Resection:  One Lymph Node Negative For Tumor  3.  Prostate, Radical Resection:      -  Prostatic Adenocarcinoma, Gleason's Combined Score of 6 (3+3) With Involvement OF Inked Margins      -  Tumor Extensively Involves the Right Lobe Of The Prostate Adn Only Focally The Left Lobe of The Prostate       Seminal Vesicles, Excision:  Negative for Tumor   4.   Prostate, Left Apex, Chips:  Negative For Tumor  5.  Bladder Neck, Biopsy:  Negative For Tumor  Past/Anticipated interventions by urology, if any: Prostatectomy 06/29/2003 and  Prostate Biopsy 02/23/2003  Past/Anticipated interventions by medical oncology, if any: no  Weight changes, if any: has lost about 30 lbs to try to control his blood suger.  Bowel/Bladder complaints, if any: On 09/03/14 he denied any dysuria, nor hematuria.  IPSS score of 21 with frequency and urgency.  Nausea/Vomiting, if any: no  Pain issues, if any:  no  SAFETY ISSUES:  Prior radiation? No  Pacemaker/ICD? Yes, Right Ventricular Apex  Possible current pregnancy? N/A  Is the patient on methotrexate? No  Current Complaints / other details:    Prostatectomy in 2005.  Difficulty with erections and failed vacuum and penile injections.   BP 120/67 mmHg  Pulse 70  Temp(Src) 98.3 F (36.8 C) (Oral)  Resp 20  Ht 5\' 11"  (1.803 m)  Wt 227 lb 4.8 oz (103.103 kg)  BMI 31.72 kg/m2  SpO2 100%

## 2014-09-27 ENCOUNTER — Encounter: Payer: Self-pay | Admitting: Radiation Oncology

## 2014-09-27 ENCOUNTER — Ambulatory Visit
Admission: RE | Admit: 2014-09-27 | Discharge: 2014-09-27 | Disposition: A | Discharge status: Home / Self Care | Payer: Managed Care, Other (non HMO) | Source: Ambulatory Visit | Attending: Radiation Oncology | Admitting: Radiation Oncology

## 2014-09-27 VITALS — BP 120/67 | HR 70 | Temp 98.3°F | Resp 20 | Ht 71.0 in | Wt 227.3 lb

## 2014-09-27 DIAGNOSIS — C61 Malignant neoplasm of prostate: Secondary | ICD-10-CM

## 2014-09-27 HISTORY — DX: Old myocardial infarction: I25.2

## 2014-09-27 HISTORY — DX: Malignant neoplasm of prostate: C61

## 2014-09-27 HISTORY — DX: Presence of cardiac pacemaker: Z95.0

## 2014-09-27 NOTE — Progress Notes (Signed)
[facility] NEW PATIENT EVALUATION  Name: [name] MRN: [id number]  Date:   09/27/2014           DOB: March 09, 1951  Status: outpatient   CC: Unice Cobble, MD  Lowella Bandy, MD    REFERRING PHYSICIAN: Lowella Bandy, MD   DIAGNOSIS: PSA recurrent carcinoma the prostate   HISTORY OF PRESENT ILLNESS:  [name] is a 64 y.o. male who is seen today through the courtesy of Dr. Janice Norrie for consideration of salvage radiation therapy in the management of his PSA recurrent carcinoma the prostate.  He was diagnosed with Gleason 6 (3+3) adenocarcinoma the prostate in October 2004.  His PSA was 4.06.  On 06/29/2003 he underwent a radical retropubic prostatectomy with lymph node dissection.  On review of his pathology he was found have Gleason 6 (3+3) with involvement of multiple inked margins.  There was no extracapsular tumor seen but tumor did involve inked margins along the right apex, bladder base, and posterior/anterior walls.  Seminal vesicles were negative.  3 lymph nodes were free of metastatic disease.  I do not have his postoperative PSA determinations, but the patient believes that he had an undetectable PSA for approximately one year.  His PSA was 0.21 in April 2015, rising to 0.26 on 08/27/2014.  He is doing well from a GU and GI standpoint although his I PSS score is 21, driven by urinary frequency and urgency.  No difficulty with urinary leakage.  He does have erectile dysfunction.  His staging workup included an abdominal/pelvic CAT scan on 09/17/2014 which was without evidence for metastatic disease.  Of note is that he does have a pacemaker along his upper left anterior chest.  PREVIOUS RADIATION THERAPY: No   PAST MEDICAL HISTORY:  has a past medical history of Ventricular tachycardia; Hypertension; Diabetes mellitus; NICM (nonischemic cardiomyopathy); Prostate cancer (06/2003); Sleep apnea; Hyperlipidemia; Chronic systolic heart failure; Anxiety;  Arthritis; Cataract; GERD (gastroesophageal reflux disease); Colon polyps; myocardial infarction; and Pacemaker.     PAST SURGICAL HISTORY:  Past Surgical History  Procedure Laterality Date  . Cardiac defibrillator placement  03/11/2004    Medtronic Maximo single lead  . Prostatectomy  06/2003    Dr Janice Norrie  . Rectal surgery    . Upper gastrointestinal endoscopy  08/2004, 02/24/11    Barrett's esophagus  . Colonoscopy w/ biopsies and polypectomy  08/2004, 02/24/11    94mm adenoma in 2006,  5 mm polyp (not recovered) 2012  . Implantable cardioverter defibrillator generator change N/A 10/07/2012    Procedure: IMPLANTABLE CARDIOVERTER DEFIBRILLATOR GENERATOR CHANGE;  Surgeon: Evans Lance, MD;  Location: [facility] CATH LAB;  Service: Cardiovascular;  Laterality: N/A;     FAMILY HISTORY: family history includes Coronary artery disease in his father; Diabetes in his father and paternal grandmother; Hypertension in his mother; Prostate cancer in his father. There is no history of Stroke. in His father died from cardiac disease but had metastatic prostate cancer when he died at age 27.  His mother died from complications of Alzheimer's disease.   SOCIAL HISTORY:  reports that he has never smoked. He has never used smokeless tobacco. He reports that he does not drink alcohol or use illicit drugs. Married, 2 children.  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   ALLERGIES: Review of patient's allergies indicates no known allergies.   MEDICATIONS:  Current Outpatient Prescriptions  Medication Sig Dispense Refill  . aspirin 81 MG  tablet Take 81 mg by mouth daily.    [name] atorvastatin (LIPITOR) 20 MG tablet Take 1 tablet by mouth on Sun, Mon, Wed & Fri and take 1/2 tablet by mouth on Tues, Thurs & Sat    . carvedilol (COREG) 12.5 MG tablet TAKE 1 & 1/2 TABLETS TWICE A DAY WITH MEALS (Patient taking differently: TAKE 1 & 1/2 TABLETS BY MOUTH TWICE A DAY WITH MEALS) 270 tablet 1  .  clonazePAM (KLONOPIN) 0.5 MG tablet 1 nightly if RLS symptoms prevent sleep (Patient taking differently: Take 1 tablet by mouth nightly if RLS symptoms prevent sleep) 30 tablet 2  . digoxin (LANOXIN) 0.125 MG tablet TAKE 1 TABLET BY MOUTH EVERY DAY 30 tablet 3  . glucose blood (ONE TOUCH ULTRA TEST) test strip USE TO TEST BLOOD SUGAR TWICE A DAY (DX 250.02) 60 each 11  . omeprazole (PRILOSEC) 20 MG capsule TAKE ONE CAPSULE BY MOUTH DAILY 30 MINUTES BEFORE BREAKFAST 90 capsule 1  . ramipril (ALTACE) 10 MG capsule TAKE 1 CAPSULE BY MOUTH TWICE DAILY 180 capsule 3  . sitaGLIPtin-metformin (JANUMET) 50-1000 MG per tablet Take 1 tablet by mouth daily with supper. 90 tablet 3  . sotalol (BETAPACE) 120 MG tablet TAKE 1 TABLET (120 MG TOTAL) BY MOUTH 2 (TWO) TIMES DAILY. 180 tablet 1  . Cyanocobalamin 500 MCG/0.1ML SOLN Place 1 mL into the nose.     No current facility-administered medications for this encounter.     REVIEW OF SYSTEMS:  Pertinent items are noted in HPI.    PHYSICAL EXAM:  height is 5\' 11"  (1.803 m) and weight is 227 lb 4.8 oz (103.103 kg). His oral temperature is 98.3 F (36.8 C). His blood pressure is 120/67 and his pulse is 70. His respiration is 20 and oxygen saturation is 100%.   Alert and oriented 64 year old African-American male appearing younger than his stated age.  Head and neck examination: Grossly unremarkable.  Nodes: Without palpable cervical or supraclavicular lymphadenopathy.  Chest: Left anterior pacemaker.  Abdomen: Lower midline prostatectomy scar.  Genitalia unremarkable to inspection.  Rectal examination: The prostate bed is flat and is without masses or nodularity.  Extremities: Without edema.   LABORATORY DATA:  Lab Results  Component Value Date   WBC 9.0 11/23/2013   HGB 12.3* 11/23/2013   HCT 39.5 11/23/2013   MCV 73.7* 11/23/2013   PLT 246.0 11/23/2013   Lab Results  Component Value Date   NA 138 08/10/2013   K 3.6 08/10/2013   CL 103 08/10/2013    CO2 28 08/10/2013   Lab Results  Component Value Date   ALT 22 11/10/2012   AST 15 11/10/2012   ALKPHOS 140* 09/30/2012   BILITOT 0.4 09/30/2012   PSA 0.26 from 08/27/2014.   IMPRESSION: PSA recurrent carcinoma the prostate.  I explained to the patient that he may have a local recurrence alone, distant recurrence alone, or both local and distant recurrences.  Predictors for local recurrence alone include, most importantly, a positive surgical margin.  Other predictors for local recurrence alone include a Gleason score of 6 and a PSA of less than 10.  A slow rise in PSA is also predictive they're for local recurrence alone.  He has all predictors for local recurrence alone although he understands that he very well could have microscopic metastatic metastatic disease elsewhere.  Considering his young age and stable cardiac disease, I would offer him radiation therapy.  We discussed the potential acute and late toxicities of radiation therapy.  We spent a good deal of time discussing bladder filling to minimize urinary toxicity.  This may be difficult for him considering his urinary frequency/urgency.  Consent is signed today.  Lastly, we need to contact his cardiologist because of his pacemaker.  This should not be an issue because of the location of his pacemaker relative to his treatment field.   PLAN: As discussed above.  I spent 45 minutes face to face with the patient and more than 50% of that time was spent in counseling and/or coordination of care.

## 2014-09-27 NOTE — Addendum Note (Signed)
Encounter addended by: Jacqulyn Liner, RN on: 09/27/2014  9:16 AM<BR>     Documentation filed: Charges VN

## 2014-09-27 NOTE — Progress Notes (Signed)
Please see the Nurse Progress Note in the MD Initial Consult Encounter for this patient. 

## 2014-10-01 ENCOUNTER — Ambulatory Visit
Admission: RE | Admit: 2014-10-01 | Discharge: 2014-10-01 | Disposition: A | Discharge status: Home / Self Care | Payer: Managed Care, Other (non HMO) | Source: Ambulatory Visit | Attending: Radiation Oncology | Admitting: Radiation Oncology

## 2014-10-01 DIAGNOSIS — C61 Malignant neoplasm of prostate: Secondary | ICD-10-CM | POA: Diagnosis not present

## 2014-10-01 NOTE — Progress Notes (Signed)
Complex simulation/treatment planning note: The patient was taken to the CT simulator.  A Vac lock immobilization device was constructed.  A red rubber tube was placed within the rectal vault.  He was then catheterized and contrast instilled into the bladder/urethra.  He was then scanned.  I chose an isocenter along the prostate bed.  The CT data set was sent to the  MIM planning system I contoured his CTV 66, high-risk prostate bed, and also low risk prostate bed, CTV54.45.  These will be expanded by 0.5 cm to create the respective PTV's.  I also contoured his rectum, lower rectosigmoid colon, and bladder.  I prescribing 6600 cGy in 33 sessions to his high risk prostate bed and 5445 cGy in 33 sessions to his low risk prostate bed.  He is now ready for IMRT's relation/treatment planning.

## 2014-10-02 ENCOUNTER — Encounter: Payer: Self-pay | Admitting: Radiation Oncology

## 2014-10-02 DIAGNOSIS — C61 Malignant neoplasm of prostate: Secondary | ICD-10-CM | POA: Diagnosis not present

## 2014-10-02 NOTE — Progress Notes (Signed)
IMRT simulation/treatment planning note: [name] completed his VMAT IMRT planning for treatment to his prostate bed.  IMRT was chosen to decrease the risk for both acute and late bladder and rectal toxicity compared to 3-D conformal or conventional radiation therapy.  Dose volume histograms were obtained for the target structures including the high and low risk prostate bed in addition to avoidance structures including the bladder, rectum, and femoral heads.  We met our departmental guidelines.  He is being treated with dual ARC VMAT IMRT with 6 MV photons.  I am prescribing 6600 cGy in 33 sessions.

## 2014-10-03 DIAGNOSIS — C61 Malignant neoplasm of prostate: Secondary | ICD-10-CM | POA: Diagnosis not present

## 2014-10-10 ENCOUNTER — Ambulatory Visit
Admission: RE | Admit: 2014-10-10 | Discharge: 2014-10-10 | Disposition: A | Discharge status: Home / Self Care | Payer: Managed Care, Other (non HMO) | Source: Ambulatory Visit | Attending: Radiation Oncology | Admitting: Radiation Oncology

## 2014-10-10 DIAGNOSIS — C61 Malignant neoplasm of prostate: Secondary | ICD-10-CM | POA: Diagnosis not present

## 2014-10-10 NOTE — Progress Notes (Signed)
Chart note: The patient began his VMAT IMRT today in the management of his carcinoma the prostate.  He is being treated with dual ARC VMAT IMRT utilizing 2 dynamic MLCs corresponding to one set of IMRT treatment devices (33612).

## 2014-10-11 ENCOUNTER — Other Ambulatory Visit: Payer: Self-pay | Admitting: Internal Medicine

## 2014-10-11 ENCOUNTER — Ambulatory Visit
Admission: RE | Admit: 2014-10-11 | Discharge: 2014-10-11 | Disposition: A | Discharge status: Home / Self Care | Payer: Managed Care, Other (non HMO) | Source: Ambulatory Visit | Attending: Radiation Oncology | Admitting: Radiation Oncology

## 2014-10-11 DIAGNOSIS — C61 Malignant neoplasm of prostate: Secondary | ICD-10-CM | POA: Diagnosis not present

## 2014-10-12 ENCOUNTER — Ambulatory Visit
Admission: RE | Admit: 2014-10-12 | Discharge: 2014-10-12 | Disposition: A | Discharge status: Home / Self Care | Payer: Managed Care, Other (non HMO) | Source: Ambulatory Visit | Attending: Radiation Oncology | Admitting: Radiation Oncology

## 2014-10-12 DIAGNOSIS — C61 Malignant neoplasm of prostate: Secondary | ICD-10-CM | POA: Diagnosis not present

## 2014-10-15 ENCOUNTER — Ambulatory Visit
Admission: RE | Admit: 2014-10-15 | Discharge: 2014-10-15 | Disposition: A | Discharge status: Home / Self Care | Payer: Managed Care, Other (non HMO) | Source: Ambulatory Visit | Attending: Radiation Oncology | Admitting: Radiation Oncology

## 2014-10-15 VITALS — BP 144/82 | HR 65 | Resp 16 | Wt 228.0 lb

## 2014-10-15 DIAGNOSIS — C61 Malignant neoplasm of prostate: Secondary | ICD-10-CM

## 2014-10-15 NOTE — Progress Notes (Signed)
Weight and vitals stable. Denies pain. Reports nocturia x 2. Denies dysuria or hematuria. Denies diarrhea. Denies incontinence or urgency.

## 2014-10-15 NOTE — Progress Notes (Signed)
Weekly Management Note:  Site: Prostate bed Current Dose:  800  cGy Projected Dose: 6600  cGy  Narrative: The patient is seen today for routine under treatment assessment. CBCT/MVCT images/port films were reviewed. The chart was reviewed.   Bladder filling is satisfactory.  No new GU or GI difficulty.  He does admit to nocturia 2.  Physical Examination:  Filed Vitals:   10/15/14 0833  BP: 144/82  Pulse: 65  Resp: 16  .  Weight: 228 lb (103.42 kg).  No change.  Impression: Tolerating radiation therapy well.  Plan: Continue radiation therapy as planned.

## 2014-10-15 NOTE — Addendum Note (Signed)
Encounter addended by: Benn Moulder, RN on: 10/15/2014  7:19 PM<BR>     Documentation filed: Demographics Visit

## 2014-10-16 ENCOUNTER — Ambulatory Visit
Admission: RE | Admit: 2014-10-16 | Discharge: 2014-10-16 | Disposition: A | Discharge status: Home / Self Care | Payer: Managed Care, Other (non HMO) | Source: Ambulatory Visit | Attending: Radiation Oncology | Admitting: Radiation Oncology

## 2014-10-16 DIAGNOSIS — C61 Malignant neoplasm of prostate: Secondary | ICD-10-CM | POA: Diagnosis not present

## 2014-10-17 ENCOUNTER — Ambulatory Visit
Admission: RE | Admit: 2014-10-17 | Discharge: 2014-10-17 | Disposition: A | Discharge status: Home / Self Care | Payer: Managed Care, Other (non HMO) | Source: Ambulatory Visit | Attending: Radiation Oncology | Admitting: Radiation Oncology

## 2014-10-17 DIAGNOSIS — C61 Malignant neoplasm of prostate: Secondary | ICD-10-CM | POA: Diagnosis not present

## 2014-10-18 ENCOUNTER — Other Ambulatory Visit: Payer: Self-pay | Admitting: Internal Medicine

## 2014-10-18 ENCOUNTER — Ambulatory Visit
Admission: RE | Admit: 2014-10-18 | Discharge: 2014-10-18 | Disposition: A | Discharge status: Home / Self Care | Payer: Managed Care, Other (non HMO) | Source: Ambulatory Visit | Attending: Radiation Oncology | Admitting: Radiation Oncology

## 2014-10-18 DIAGNOSIS — C61 Malignant neoplasm of prostate: Secondary | ICD-10-CM | POA: Diagnosis not present

## 2014-10-19 ENCOUNTER — Ambulatory Visit
Admission: RE | Admit: 2014-10-19 | Discharge: 2014-10-19 | Disposition: A | Discharge status: Home / Self Care | Payer: Managed Care, Other (non HMO) | Source: Ambulatory Visit | Attending: Radiation Oncology | Admitting: Radiation Oncology

## 2014-10-19 DIAGNOSIS — C61 Malignant neoplasm of prostate: Secondary | ICD-10-CM | POA: Diagnosis not present

## 2014-10-22 ENCOUNTER — Ambulatory Visit
Admission: RE | Admit: 2014-10-22 | Discharge: 2014-10-22 | Disposition: A | Discharge status: Home / Self Care | Payer: Managed Care, Other (non HMO) | Source: Ambulatory Visit | Attending: Radiation Oncology | Admitting: Radiation Oncology

## 2014-10-22 VITALS — BP 153/93 | HR 60 | Temp 98.0°F | Resp 12 | Wt 226.9 lb

## 2014-10-22 DIAGNOSIS — C61 Malignant neoplasm of prostate: Secondary | ICD-10-CM

## 2014-10-22 NOTE — Progress Notes (Signed)
He rates his pain as a 2 on a scale of 0-10. intermittent and sharp over left hip. Pt complains of loss of sleep- pain from bilateral hips at night is bothersome. Pt reports urinary frequency, urgency and pain with urination. Pt states they urinate 2 - 3 times per night.  Pt reports a soft bowel movement everyday/everyother day. BP 153/93 mmHg  Pulse 60  Temp(Src) 98 F (36.7 C) (Oral)  Resp 12  Wt 226 lb 14.4 oz (102.921 kg)  SpO2 100%

## 2014-10-22 NOTE — Addendum Note (Signed)
Encounter addended by: Jenene Slicker, RN on: 10/22/2014 10:42 AM<BR>     Documentation filed: Inpatient Patient Education

## 2014-10-22 NOTE — Progress Notes (Signed)
Pt here for patient teaching.  Pt given Radiation and You booklet and skin care instructions. Reviewed areas of pertinence such as diarrhea, fatigue, hair loss, sexual and fertility changes, skin changes and urinary and bladder changes . Pt able to give teach back of to pat skin, use unscented/gentle soap, use baby wipes, have Imodium on hand, drink plenty of water and sitz bath. Sitz bath and urinal given with instruction. Pt demonstrated understanding of information given and will contact nursing with any questions or concerns.

## 2014-10-22 NOTE — Addendum Note (Signed)
Encounter addended by: Jenene Slicker, RN on: 10/22/2014  9:54 AM<BR>     Documentation filed: Inpatient Patient Education, Chief Complaint Section

## 2014-10-22 NOTE — Addendum Note (Signed)
Encounter addended by: Jenene Slicker, RN on: 10/22/2014  9:32 AM<BR>     Documentation filed: Notes Section

## 2014-10-22 NOTE — Progress Notes (Signed)
   Weekly Management Note:  Outpatient    ICD-9-CM ICD-10-CM   1. Malignant neoplasm of prostate 185 C61     Current Dose:  18 Gy  Projected Dose: 66 Gy   Narrative:  The patient presents for routine under treatment assessment.  CBCT/MVCT images/Port film x-rays were reviewed.  The chart was checked. Pt reports stable urinary frequency, urgency  with urination. Pt states they urinate 2 - 3 times per night. Pt reports a soft bowel movement everyday/everyother day. The only change to his symptoms this week compared to last is some slight burning while urinating. The CT report of his abd pelvis from May 10th noted no signs of metastatic disease in the skeleton. He has been taking 3 ibuprofens when needed to ease  hip pain at night.    Physical Findings:  weight is 226 lb 14.4 oz (102.921 kg). His oral temperature is 98 F (36.7 C). His blood pressure is 153/93 and his pulse is 60. His respiration is 12 and oxygen saturation is 100%.   Wt Readings from Last 3 Encounters:  10/22/14 226 lb 14.4 oz (102.921 kg)  10/15/14 228 lb (103.42 kg)  09/27/14 227 lb 4.8 oz (103.103 kg)   NAD  Impression:  The patient is tolerating radiotherapy.  Plan:  Continue radiotherapy as planned.  Pt will discuss hip pain w/ his PCP and let Dr Valere Dross know if it persists  This document serves as a record of services personally performed by Eppie Gibson, MD. It was created on her behalf by Arlyce Harman, a trained medical scribe. The creation of this record is based on the scribe's personal observations and the provider's statements to them. This document has been checked and approved by the attending provider. ________________________________   Eppie Gibson, M.D.

## 2014-10-23 ENCOUNTER — Ambulatory Visit
Admission: RE | Admit: 2014-10-23 | Discharge: 2014-10-23 | Disposition: A | Discharge status: Home / Self Care | Payer: Managed Care, Other (non HMO) | Source: Ambulatory Visit | Attending: Radiation Oncology | Admitting: Radiation Oncology

## 2014-10-23 DIAGNOSIS — C61 Malignant neoplasm of prostate: Secondary | ICD-10-CM | POA: Diagnosis not present

## 2014-10-24 ENCOUNTER — Ambulatory Visit
Admission: RE | Admit: 2014-10-24 | Discharge: 2014-10-24 | Disposition: A | Discharge status: Home / Self Care | Payer: Managed Care, Other (non HMO) | Source: Ambulatory Visit | Attending: Radiation Oncology | Admitting: Radiation Oncology

## 2014-10-24 DIAGNOSIS — C61 Malignant neoplasm of prostate: Secondary | ICD-10-CM | POA: Diagnosis not present

## 2014-10-25 ENCOUNTER — Ambulatory Visit
Admission: RE | Admit: 2014-10-25 | Discharge: 2014-10-25 | Disposition: A | Discharge status: Home / Self Care | Payer: Managed Care, Other (non HMO) | Source: Ambulatory Visit | Attending: Radiation Oncology | Admitting: Radiation Oncology

## 2014-10-25 DIAGNOSIS — C61 Malignant neoplasm of prostate: Secondary | ICD-10-CM | POA: Diagnosis not present

## 2014-10-26 ENCOUNTER — Ambulatory Visit
Admission: RE | Admit: 2014-10-26 | Discharge: 2014-10-26 | Disposition: A | Discharge status: Home / Self Care | Payer: Managed Care, Other (non HMO) | Source: Ambulatory Visit | Attending: Radiation Oncology | Admitting: Radiation Oncology

## 2014-10-26 DIAGNOSIS — C61 Malignant neoplasm of prostate: Secondary | ICD-10-CM | POA: Diagnosis not present

## 2014-10-29 ENCOUNTER — Ambulatory Visit
Admission: RE | Admit: 2014-10-29 | Discharge: 2014-10-29 | Disposition: A | Discharge status: Home / Self Care | Payer: Managed Care, Other (non HMO) | Source: Ambulatory Visit | Attending: Radiation Oncology | Admitting: Radiation Oncology

## 2014-10-29 ENCOUNTER — Encounter: Payer: Self-pay | Admitting: Radiation Oncology

## 2014-10-29 VITALS — BP 157/86 | HR 66 | Temp 97.3°F | Resp 20 | Wt 227.0 lb

## 2014-10-29 DIAGNOSIS — C61 Malignant neoplasm of prostate: Secondary | ICD-10-CM | POA: Diagnosis not present

## 2014-10-29 LAB — URINALYSIS, MICROSCOPIC - CHCC
BILIRUBIN (URINE): NEGATIVE
Blood: NEGATIVE
GLUCOSE UR CHCC: NEGATIVE mg/dL
Ketones: NEGATIVE mg/dL
LEUKOCYTE ESTERASE: NEGATIVE
NITRITE: NEGATIVE
PH: 6 (ref 4.6–8.0)
Protein: NEGATIVE mg/dL
RBC / HPF: NEGATIVE (ref 0–2)
Specific Gravity, Urine: 1.005 (ref 1.003–1.035)
Urobilinogen, UR: 0.2 mg/dL (ref 0.2–1)
WBC UA: NEGATIVE (ref 0–2)

## 2014-10-29 NOTE — Progress Notes (Signed)
Weekly rad txs prostate 14/33 compleetd, increased dysuria, nocturia x6 last night, regular bowel movements, no hematuria , appetite good, drinks plenty water 7:51 AM

## 2014-10-29 NOTE — Addendum Note (Signed)
Encounter addended by: Arloa Koh, MD on: 10/29/2014  8:06 AM<BR>     Documentation filed: Arn Medal VN

## 2014-10-29 NOTE — Progress Notes (Signed)
Weekly Management Note:  Site: Prostate bed Current Dose:  2800  cGy Projected Dose: 6600  cGy  Narrative: The patient is seen today for routine under treatment assessment. CBCT/MVCT images/port films were reviewed. The chart was reviewed.   Bladder filling is excellent.  He does report nocturia 3-6.  He does have significant dysuria.  No GI difficulties although he did have some loosening of his bowels once last week.  Physical Examination:  Filed Vitals:   10/29/14 0749  BP: 157/86  Pulse: 66  Temp: 97.3 F (36.3 C)  Resp: 20  .  Weight: 227 lb (102.967 kg).  No change.  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  I think we need to obtain a urinalysis/culture since he is early on during his course of therapy just to make sure that he does not have a urinary tract infection.  Plan: Continue radiation therapy as planned.

## 2014-10-30 ENCOUNTER — Ambulatory Visit
Admission: RE | Admit: 2014-10-30 | Discharge: 2014-10-30 | Disposition: A | Discharge status: Home / Self Care | Payer: Managed Care, Other (non HMO) | Source: Ambulatory Visit | Attending: Radiation Oncology | Admitting: Radiation Oncology

## 2014-10-30 DIAGNOSIS — C61 Malignant neoplasm of prostate: Secondary | ICD-10-CM | POA: Diagnosis not present

## 2014-10-30 LAB — URINE CULTURE

## 2014-10-31 ENCOUNTER — Ambulatory Visit
Admission: RE | Admit: 2014-10-31 | Discharge: 2014-10-31 | Disposition: A | Discharge status: Home / Self Care | Payer: Managed Care, Other (non HMO) | Source: Ambulatory Visit | Attending: Radiation Oncology | Admitting: Radiation Oncology

## 2014-10-31 DIAGNOSIS — C61 Malignant neoplasm of prostate: Secondary | ICD-10-CM | POA: Diagnosis not present

## 2014-11-01 ENCOUNTER — Ambulatory Visit
Admission: RE | Admit: 2014-11-01 | Discharge: 2014-11-01 | Disposition: A | Discharge status: Home / Self Care | Payer: Managed Care, Other (non HMO) | Source: Ambulatory Visit | Attending: Radiation Oncology | Admitting: Radiation Oncology

## 2014-11-01 DIAGNOSIS — C61 Malignant neoplasm of prostate: Secondary | ICD-10-CM | POA: Diagnosis not present

## 2014-11-02 ENCOUNTER — Ambulatory Visit
Admission: RE | Admit: 2014-11-02 | Discharge: 2014-11-02 | Disposition: A | Discharge status: Home / Self Care | Payer: Managed Care, Other (non HMO) | Source: Ambulatory Visit | Attending: Radiation Oncology | Admitting: Radiation Oncology

## 2014-11-02 DIAGNOSIS — C61 Malignant neoplasm of prostate: Secondary | ICD-10-CM | POA: Diagnosis not present

## 2014-11-05 ENCOUNTER — Encounter: Payer: Self-pay | Admitting: Radiation Oncology

## 2014-11-05 ENCOUNTER — Ambulatory Visit
Admission: RE | Admit: 2014-11-05 | Discharge: 2014-11-05 | Disposition: A | Discharge status: Home / Self Care | Payer: Managed Care, Other (non HMO) | Source: Ambulatory Visit | Attending: Radiation Oncology | Admitting: Radiation Oncology

## 2014-11-05 ENCOUNTER — Other Ambulatory Visit: Payer: Self-pay | Admitting: Emergency Medicine

## 2014-11-05 VITALS — BP 151/89 | HR 69 | Temp 98.7°F | Resp 20 | Wt 225.7 lb

## 2014-11-05 DIAGNOSIS — C61 Malignant neoplasm of prostate: Secondary | ICD-10-CM | POA: Diagnosis not present

## 2014-11-05 MED ORDER — RAMIPRIL 10 MG PO CAPS: ORAL_CAPSULE | ORAL | Status: DC

## 2014-11-05 NOTE — Progress Notes (Addendum)
Weekly rad txs prostate  19/33 compleetd, dysuria and pain when having  Loose bowel movements, discharge nocturia 2x lately stated, but goes to bed around 2-3 am, appeitite good drinks plenty of water having gas rectal and having abdominal gas occasionally   Wt Readings from Last 3 Encounters:  10/29/14 227 lb (102.967 kg)  10/22/14 226 lb 14.4 oz (102.921 kg)  10/15/14 228 lb (103.42 kg)  BP 151/89 mmHg  Pulse 69  Temp(Src) 98.7 F (37.1 C) (Oral)  Resp 20  Wt 225 lb 11.2 oz (102.377 kg)  Gaspar Garbe, RN II Rad/Onc

## 2014-11-05 NOTE — Progress Notes (Signed)
Weekly Management Note:  Site: Prostate bed Current Dose:  3800  cGy Projected Dose: 6600  cGy  Narrative: The patient is seen today for routine under treatment assessment. CBCT/MVCT images/port films were reviewed. The chart was reviewed.   Bladder filling is satisfactory.  He does have loose bowel movements, rectal irritation with a mucus discharge, and also dysuria.  Physical Examination:  Filed Vitals:   11/05/14 0830  BP: 151/89  Pulse: 69  Temp: 98.7 F (37.1 C)  Resp: 20  .  Weight: 225 lb 11.2 oz (102.377 kg).  No change.  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  He is to avoid constipation.  He is taking sitz baths.  Plan: Continue radiation therapy as planned.

## 2014-11-06 ENCOUNTER — Ambulatory Visit
Admission: RE | Admit: 2014-11-06 | Discharge: 2014-11-06 | Disposition: A | Discharge status: Home / Self Care | Payer: Managed Care, Other (non HMO) | Source: Ambulatory Visit | Attending: Radiation Oncology | Admitting: Radiation Oncology

## 2014-11-06 DIAGNOSIS — C61 Malignant neoplasm of prostate: Secondary | ICD-10-CM | POA: Diagnosis not present

## 2014-11-07 ENCOUNTER — Ambulatory Visit
Admission: RE | Admit: 2014-11-07 | Discharge: 2014-11-07 | Disposition: A | Discharge status: Home / Self Care | Payer: Managed Care, Other (non HMO) | Source: Ambulatory Visit | Attending: Radiation Oncology | Admitting: Radiation Oncology

## 2014-11-07 DIAGNOSIS — C61 Malignant neoplasm of prostate: Secondary | ICD-10-CM | POA: Diagnosis not present

## 2014-11-08 ENCOUNTER — Ambulatory Visit
Admission: RE | Admit: 2014-11-08 | Discharge: 2014-11-08 | Disposition: A | Discharge status: Home / Self Care | Payer: Managed Care, Other (non HMO) | Source: Ambulatory Visit | Attending: Radiation Oncology | Admitting: Radiation Oncology

## 2014-11-08 DIAGNOSIS — C61 Malignant neoplasm of prostate: Secondary | ICD-10-CM | POA: Diagnosis not present

## 2014-11-09 ENCOUNTER — Ambulatory Visit
Admission: RE | Admit: 2014-11-09 | Discharge: 2014-11-09 | Disposition: A | Discharge status: Home / Self Care | Payer: Managed Care, Other (non HMO) | Source: Ambulatory Visit | Attending: Radiation Oncology | Admitting: Radiation Oncology

## 2014-11-09 DIAGNOSIS — C61 Malignant neoplasm of prostate: Secondary | ICD-10-CM | POA: Diagnosis not present

## 2014-11-13 ENCOUNTER — Ambulatory Visit
Admission: RE | Admit: 2014-11-13 | Discharge: 2014-11-13 | Disposition: A | Discharge status: Home / Self Care | Payer: Managed Care, Other (non HMO) | Source: Ambulatory Visit | Attending: Radiation Oncology | Admitting: Radiation Oncology

## 2014-11-13 ENCOUNTER — Encounter: Payer: Self-pay | Admitting: Radiation Oncology

## 2014-11-13 VITALS — BP 136/77 | HR 75 | Temp 97.6°F | Resp 20 | Wt 225.8 lb

## 2014-11-13 DIAGNOSIS — C61 Malignant neoplasm of prostate: Secondary | ICD-10-CM

## 2014-11-13 NOTE — Progress Notes (Signed)
Weekly Management Note:  Site: Prostate bed Current Dose:  4800  cGy Projected Dose: 6600  cGy  Narrative: The patient is seen today for routine under treatment assessment. CBCT/MVCT images/port films were reviewed. The chart was reviewed.   Bladder filling remains excellent.  He still has mild dysuria which is improved with a Azo.  He is otherwise doing well from a GU and GI standpoint.  Physical Examination:  Filed Vitals:   11/13/14 0822  BP: 136/77  Pulse: 75  Temp: 97.6 F (36.4 C)  Resp: 20  .  Weight: 225 lb 12.8 oz (102.422 kg).  No change.  Impression: Tolerating radiation therapy well.  Plan: Continue radiation therapy as planned.

## 2014-11-13 NOTE — Progress Notes (Addendum)
Weekly rad txs prostate, still having dysuria,  Frequency, nocturia x2,, appetite good, no hematuria, bowels regular BP 136/77 mmHg  Pulse 75  Temp(Src) 97.6 F (36.4 C) (Other (Comment))  Resp 20  Wt 225 lb 12.8 oz (102.422 kg)  Wt Readings from Last 3 Encounters:  11/13/14 225 lb 12.8 oz (102.422 kg)  11/05/14 225 lb 11.2 oz (102.377 kg)  10/29/14 227 lb (102.967 kg)  . 8:23 AM

## 2014-11-14 ENCOUNTER — Ambulatory Visit
Admission: RE | Admit: 2014-11-14 | Discharge: 2014-11-14 | Disposition: A | Discharge status: Home / Self Care | Payer: Managed Care, Other (non HMO) | Source: Ambulatory Visit | Attending: Radiation Oncology | Admitting: Radiation Oncology

## 2014-11-14 DIAGNOSIS — C61 Malignant neoplasm of prostate: Secondary | ICD-10-CM | POA: Diagnosis not present

## 2014-11-15 ENCOUNTER — Ambulatory Visit
Admission: RE | Admit: 2014-11-15 | Discharge: 2014-11-15 | Disposition: A | Discharge status: Home / Self Care | Payer: Managed Care, Other (non HMO) | Source: Ambulatory Visit | Attending: Radiation Oncology | Admitting: Radiation Oncology

## 2014-11-15 DIAGNOSIS — C61 Malignant neoplasm of prostate: Secondary | ICD-10-CM | POA: Diagnosis not present

## 2014-11-16 ENCOUNTER — Ambulatory Visit
Admission: RE | Admit: 2014-11-16 | Discharge: 2014-11-16 | Disposition: A | Discharge status: Home / Self Care | Payer: Managed Care, Other (non HMO) | Source: Ambulatory Visit | Attending: Radiation Oncology | Admitting: Radiation Oncology

## 2014-11-16 DIAGNOSIS — C61 Malignant neoplasm of prostate: Secondary | ICD-10-CM | POA: Diagnosis not present

## 2014-11-19 ENCOUNTER — Ambulatory Visit
Admission: RE | Admit: 2014-11-19 | Discharge: 2014-11-19 | Disposition: A | Discharge status: Home / Self Care | Payer: Managed Care, Other (non HMO) | Source: Ambulatory Visit | Attending: Radiation Oncology | Admitting: Radiation Oncology

## 2014-11-19 ENCOUNTER — Encounter: Payer: Self-pay | Admitting: Radiation Oncology

## 2014-11-19 ENCOUNTER — Ambulatory Visit: Admission: RE | Admit: 2014-11-19 | Payer: Managed Care, Other (non HMO) | Source: Ambulatory Visit

## 2014-11-19 VITALS — BP 133/82 | HR 66 | Temp 97.9°F | Resp 20 | Wt 228.8 lb

## 2014-11-19 DIAGNOSIS — C61 Malignant neoplasm of prostate: Secondary | ICD-10-CM | POA: Diagnosis not present

## 2014-11-19 DIAGNOSIS — Z8546 Personal history of malignant neoplasm of prostate: Secondary | ICD-10-CM

## 2014-11-19 NOTE — Progress Notes (Signed)
   Weekly Management Note:  outpatient    ICD-9-CM ICD-10-CM   1. PROSTATE CANCER, HX OF V10.46 Z85.46     Current Dose:  56 Gy  Projected Dose: 66 Gy   Narrative:  The patient presents for routine under treatment assessment.  CBCT/MVCT images/Port film x-rays were reviewed.  The chart was checked. Doing well but persistent dysuria, not helped by pyridium, no fever.  Physical Findings:  weight is 228 lb 12.8 oz (103.783 kg). His oral temperature is 97.9 F (36.6 C). His blood pressure is 133/82 and his pulse is 66. His respiration is 20.   Wt Readings from Last 3 Encounters:  11/19/14 228 lb 12.8 oz (103.783 kg)  11/13/14 225 lb 12.8 oz (102.422 kg)  11/05/14 225 lb 11.2 oz (102.377 kg)   NAD  Impression:  The patient is tolerating radiotherapy.  Plan:  Continue radiotherapy as planned. Urine culture was negative. Pt may stop pyridium if he wishes.  Sx are tolerable  ________________________________   Eppie Gibson, M.D.

## 2014-11-19 NOTE — Progress Notes (Addendum)
Weekly rad txs ,Machine down, not treated yet  Prostate, still has dysuria ,takes Azo bid OTC, not helping stated, bowels normal,  8:20 AM BP 133/82 mmHg  Pulse 66  Temp(Src) 97.9 F (36.6 C) (Oral)  Resp 20  Wt 228 lb 12.8 oz (103.783 kg)  Wt Readings from Last 3 Encounters:  11/19/14 228 lb 12.8 oz (103.783 kg)  11/13/14 225 lb 12.8 oz (102.422 kg)  11/05/14 225 lb 11.2 oz (102.377 kg)

## 2014-11-20 ENCOUNTER — Ambulatory Visit
Admission: RE | Admit: 2014-11-20 | Discharge: 2014-11-20 | Disposition: A | Discharge status: Home / Self Care | Payer: Managed Care, Other (non HMO) | Source: Ambulatory Visit | Attending: Radiation Oncology | Admitting: Radiation Oncology

## 2014-11-20 DIAGNOSIS — C61 Malignant neoplasm of prostate: Secondary | ICD-10-CM | POA: Diagnosis not present

## 2014-11-21 ENCOUNTER — Ambulatory Visit
Admission: RE | Admit: 2014-11-21 | Discharge: 2014-11-21 | Disposition: A | Discharge status: Home / Self Care | Payer: Managed Care, Other (non HMO) | Source: Ambulatory Visit | Attending: Radiation Oncology | Admitting: Radiation Oncology

## 2014-11-21 DIAGNOSIS — C61 Malignant neoplasm of prostate: Secondary | ICD-10-CM | POA: Diagnosis not present

## 2014-11-22 ENCOUNTER — Encounter: Payer: Self-pay | Admitting: Radiation Oncology

## 2014-11-22 ENCOUNTER — Ambulatory Visit
Admission: RE | Admit: 2014-11-22 | Discharge: 2014-11-22 | Disposition: A | Discharge status: Home / Self Care | Payer: Managed Care, Other (non HMO) | Source: Ambulatory Visit | Attending: Radiation Oncology | Admitting: Radiation Oncology

## 2014-11-22 VITALS — BP 152/92 | HR 71 | Temp 98.1°F | Resp 16 | Ht 71.0 in | Wt 227.4 lb

## 2014-11-22 DIAGNOSIS — C61 Malignant neoplasm of prostate: Secondary | ICD-10-CM

## 2014-11-22 NOTE — Progress Notes (Signed)
  Radiation Oncology         [phone]   Name: [name] MRN: [id number]   Date: 11/22/2014  DOB: September 04, 1950   Weekly Radiation Therapy Management    ICD-9-CM ICD-10-CM   1. Malignant neoplasm of prostate 185 C61     Current Dose: 62 Gy  Planned Dose:  66 Gy  Narrative The patient presents for routine under treatment assessment. He reports that he is having trouble urinating that started last night. He said that he has a feeling of urgency but can only urinate a "few dribbles." He reports dysuria. He said he thinks there is blood in his urine but it is hard to tell since he is taking Azo. He last took the Azo yesterday. He denies nocturia. He also reports constipation although he did have a normal bowel movement yesterday.  The patient is without complaint. Set-up films were reviewed. The chart was checked.  Physical Findings  height is 5\' 11"  (1.803 m) and weight is 227 lb 6.4 oz (103.148 kg). His oral temperature is 98.1 F (36.7 C). His blood pressure is 152/92 and his pulse is 71. His respiration is 16. . Weight essentially stable.  No significant changes.  Impression The patient is tolerating radiation.  His inability to urinate coupled with suspicion for hematuria raise the index of suspicion for clot in the bladder  Plan Referral to Dr. Janice Norrie today to inspect whether there is a blood clot causing the patient's recent symptoms. Continue treatment as planned.    This document serves as a record of services personally performed by Tyler Pita, MD. It was created on his behalf by Arlyce Harman, a trained medical scribe. The creation of this record is based on the scribe's personal observations and the provider's statements to them. This document has been checked and approved by the attending provider.       [name], M.D.

## 2014-11-22 NOTE — Progress Notes (Signed)
[name] has completed 31 fractions to his prostate.  He reports that he is having trouble urinating that started last night.  He said that he has a feeling of urgency but can only urinate a "few dribbles."  He reports dysuria.  He said he thinks there is blood in his urine but it is hard to tell since he is taking Azo.  He last took the Azo yesterday.  He denies nocturia.  He also reports constipation although he did have a normal bowel movement yesterday.    BP 152/92 mmHg  Pulse 71  Temp(Src) 98.1 F (36.7 C) (Oral)  Resp 16  Ht 5\' 11"  (1.803 m)

## 2014-11-23 ENCOUNTER — Ambulatory Visit
Admission: RE | Admit: 2014-11-23 | Discharge: 2014-11-23 | Disposition: A | Discharge status: Home / Self Care | Payer: Managed Care, Other (non HMO) | Source: Ambulatory Visit | Attending: Radiation Oncology | Admitting: Radiation Oncology

## 2014-11-23 DIAGNOSIS — C61 Malignant neoplasm of prostate: Secondary | ICD-10-CM | POA: Diagnosis not present

## 2014-11-26 ENCOUNTER — Ambulatory Visit: Payer: Managed Care, Other (non HMO)

## 2014-11-26 ENCOUNTER — Other Ambulatory Visit: Payer: Self-pay | Admitting: Internal Medicine

## 2014-11-26 ENCOUNTER — Ambulatory Visit
Admission: RE | Admit: 2014-11-26 | Discharge: 2014-11-26 | Disposition: A | Discharge status: Home / Self Care | Payer: Managed Care, Other (non HMO) | Source: Ambulatory Visit | Attending: Radiation Oncology | Admitting: Radiation Oncology

## 2014-11-26 ENCOUNTER — Encounter: Payer: Self-pay | Admitting: Radiation Oncology

## 2014-11-26 VITALS — BP 157/81 | HR 68 | Temp 98.5°F | Resp 12 | Wt 228.9 lb

## 2014-11-26 DIAGNOSIS — C61 Malignant neoplasm of prostate: Secondary | ICD-10-CM | POA: Diagnosis not present

## 2014-11-26 NOTE — Progress Notes (Signed)
PAIN: He is currently in no pain.  URINARY: Pt reports urinary frequency, urgency, retention, hesistency, pain with urination, blood in urine, incontinence and dribbling. Pt states they urinate 1 - 2 times per night.  BOWEL: Pt reports  a soft bowel movement everyday/everyother day.  BP 157/81 mmHg  Pulse 68  Temp(Src) 98.5 F (36.9 C) (Oral)  Resp 12  Wt 228 lb 14.4 oz (103.828 kg)  SpO2 100% Wt Readings from Last 3 Encounters:  11/26/14 228 lb 14.4 oz (103.828 kg)  11/22/14 227 lb 6.4 oz (103.148 kg)  11/19/14 228 lb 12.8 oz (103.783 kg)

## 2014-11-26 NOTE — Progress Notes (Signed)
[facility] End of Treatment Note  Name:[name]  Date: 11/26/2014 LTJ:[id number] DOB:04/26/1951   Status:outpatient    CC: Unice Cobble, MD  Dr. Lowella Bandy  REFERRING PHYSICIAN:  Dr. Lowella Bandy   DIAGNOSIS:  PSA recurrent carcinoma the prostate  INDICATION FOR TREATMENT: Curative   TREATMENT DATES: 10/10/2014 through 11/26/2014                          SITE/DOSE: Prostate bed 6600 cGy in 33 sessions                           BEAMS/ENERGY:    Dual ARC VMAT IMRT with 6 MV photons               NARRATIVE:  Mr. Diodato tolerated his treatment well although he did have an episode of urinary retention during his last week of therapy for which she underwent catheterization.  Since then he is had no difficulties with obstruction.                          PLAN: Routine followup in one month. Patient instructed to call if questions or worsening complaints in interim.

## 2014-11-26 NOTE — Progress Notes (Signed)
Weekly Management Note:  Site: Prostate bed Current Dose:  6600  cGy Projected Dose: 6600  cGy  Narrative: The patient is seen today for routine under treatment assessment. CBCT/MVCT images/port films were reviewed. The chart was reviewed.   Bladder filling is satisfactory.  He did have urinary retention last week and he was catheterized at [facility].  He was given catheters in case he goes into retention again.  This apparently happened postoperatively after his prostatectomy back in 2005.  The senses catheterization he has had good urine flow.  No GI difficulties.  Radiation therapy completed today.  Physical Examination:  Filed Vitals:   11/26/14 0825  BP: 157/81  Pulse: 68  Temp: 98.5 F (36.9 C)  Resp: 12  .  Weight: 228 lb 14.4 oz (103.828 kg).  No change.  Impression: Radiation therapy completed with reasonable toxicity.  Plan: Follow-up visit in one month.

## 2014-11-27 ENCOUNTER — Ambulatory Visit: Payer: Managed Care, Other (non HMO)

## 2014-11-28 ENCOUNTER — Ambulatory Visit: Payer: Managed Care, Other (non HMO)

## 2014-12-04 ENCOUNTER — Ambulatory Visit (INDEPENDENT_AMBULATORY_CARE_PROVIDER_SITE_OTHER): Payer: Managed Care, Other (non HMO) | Admitting: *Deleted

## 2014-12-04 DIAGNOSIS — I5022 Chronic systolic (congestive) heart failure: Secondary | ICD-10-CM

## 2014-12-04 DIAGNOSIS — Z95810 Presence of automatic (implantable) cardiac defibrillator: Secondary | ICD-10-CM | POA: Diagnosis not present

## 2014-12-04 DIAGNOSIS — I429 Cardiomyopathy, unspecified: Secondary | ICD-10-CM

## 2014-12-04 DIAGNOSIS — I428 Other cardiomyopathies: Secondary | ICD-10-CM

## 2014-12-18 ENCOUNTER — Telehealth: Payer: Self-pay | Admitting: Internal Medicine

## 2014-12-18 LAB — CUP PACEART REMOTE DEVICE CHECK
Battery Remaining Longevity: 123 mo
Battery Voltage: 3.03 V
Brady Statistic RV Percent Paced: 0.01 %
HighPow Impedance: 60 Ohm
Lead Channel Impedance Value: 475 Ohm
Lead Channel Pacing Threshold Amplitude: 0.5 V
Lead Channel Pacing Threshold Pulse Width: 0.4 ms
Lead Channel Setting Pacing Amplitude: 2.5 V
MDC IDC MSMT LEADCHNL RV IMPEDANCE VALUE: 399 Ohm
MDC IDC MSMT LEADCHNL RV SENSING INTR AMPL: 7.125 mV
MDC IDC MSMT LEADCHNL RV SENSING INTR AMPL: 7.125 mV
MDC IDC SESS DTM: 20160725143057
MDC IDC SET LEADCHNL RV PACING PULSEWIDTH: 0.4 ms
MDC IDC SET LEADCHNL RV SENSING SENSITIVITY: 0.3 mV
MDC IDC SET ZONE DETECTION INTERVAL: 290 ms
MDC IDC SET ZONE DETECTION INTERVAL: 450 ms
Zone Setting Detection Interval: 240 ms
Zone Setting Detection Interval: 370 ms

## 2014-12-18 NOTE — Telephone Encounter (Signed)
New MEssage  Pt wanting to know if July remote was sent through successfully. Please call back and discuss.

## 2014-12-18 NOTE — Telephone Encounter (Signed)
Informed pt that his 12-03-14 remote transmission was received. Pt verbalized understanding.

## 2014-12-31 ENCOUNTER — Encounter: Payer: Self-pay | Admitting: Radiation Oncology

## 2015-01-01 ENCOUNTER — Encounter: Payer: Self-pay | Admitting: Radiation Oncology

## 2015-01-01 ENCOUNTER — Ambulatory Visit
Admission: RE | Admit: 2015-01-01 | Discharge: 2015-01-01 | Disposition: A | Discharge status: Home / Self Care | Payer: Managed Care, Other (non HMO) | Source: Ambulatory Visit | Attending: Radiation Oncology | Admitting: Radiation Oncology

## 2015-01-01 VITALS — BP 152/86 | HR 64 | Temp 97.7°F | Ht 71.0 in | Wt 229.9 lb

## 2015-01-01 DIAGNOSIS — C61 Malignant neoplasm of prostate: Secondary | ICD-10-CM

## 2015-01-01 HISTORY — DX: Personal history of irradiation: Z92.3

## 2015-01-01 NOTE — Progress Notes (Signed)
[name] returns for FU S/P XRT to his pelvis for prostate cancer.  Mr. Austad reports burning upon urination with bladder spasms, but states lessening in intensity since the end ov treatment.  Requesting medical intervention for the spasms at this time. Nocturia 2-4 times.  Denies any rectal irritation, loose stools nor diarrhea.

## 2015-01-01 NOTE — Progress Notes (Signed)
CC: Dr. Lowella Bandy  Follow-up note:   [name] returns today approximately 1 month follow-up completion of radiation therapy in the management of his PSA recurrent carcinoma the prostate.  He continues to have occasional "spasms" with burning on initiation of urination, but only intermittently.  He believes that this is improving day by day.  He feels that he is back to his urinary baseline habits except for "painful spasms".  He tells me that he does not believe that he empties his bladder and he believes that this has been long-standing.  No GI difficulties.  He will see Dr. Janice Norrie on September 8.  Physical examination: Filed Vitals:   01/01/15 1135  BP: 152/86  Pulse: 64  Temp: 97.7 F (36.5 C)   Rectal examination not performed today.  Impression: It sounds as if he is having occasional bladderspasms, but without knowing, I would be reluctant to start him on an anti-spasmodic medication knowing that he did go into urinary retention requiring catheterization during his radiation therapy.  Dr. Janice Norrie may need to check a post for residual to further evaluate him.  I understand he will have a PSA prior to his visit with Dr. Janice Norrie as well.  Plan: Follow-up through Dr. Janice Norrie and [facility].  I've not scheduled [name] for a formal follow-up visit, and I would appreciate follow-up on his progress.

## 2015-01-06 ENCOUNTER — Other Ambulatory Visit: Payer: Self-pay | Admitting: Internal Medicine

## 2015-01-07 ENCOUNTER — Other Ambulatory Visit: Payer: Self-pay | Admitting: Emergency Medicine

## 2015-01-07 MED ORDER — ATORVASTATIN CALCIUM 20 MG PO TABS: 20.0000 mg | ORAL_TABLET | Freq: Every day | ORAL | Status: DC

## 2015-01-10 ENCOUNTER — Encounter: Payer: Self-pay | Admitting: Cardiology

## 2015-01-22 ENCOUNTER — Encounter: Payer: Self-pay | Admitting: Internal Medicine

## 2015-01-30 ENCOUNTER — Other Ambulatory Visit: Payer: Self-pay | Admitting: Internal Medicine

## 2015-01-31 ENCOUNTER — Other Ambulatory Visit: Payer: Self-pay | Admitting: Internal Medicine

## 2015-02-07 LAB — HM DIABETES EYE EXAM

## 2015-02-13 ENCOUNTER — Encounter: Payer: Self-pay | Admitting: Internal Medicine

## 2015-03-02 ENCOUNTER — Other Ambulatory Visit: Payer: Self-pay | Admitting: Internal Medicine

## 2015-03-04 ENCOUNTER — Ambulatory Visit (INDEPENDENT_AMBULATORY_CARE_PROVIDER_SITE_OTHER): Payer: Managed Care, Other (non HMO) | Admitting: *Deleted

## 2015-03-04 ENCOUNTER — Other Ambulatory Visit: Payer: Self-pay | Admitting: Emergency Medicine

## 2015-03-04 DIAGNOSIS — I429 Cardiomyopathy, unspecified: Secondary | ICD-10-CM | POA: Diagnosis not present

## 2015-03-04 DIAGNOSIS — I428 Other cardiomyopathies: Secondary | ICD-10-CM

## 2015-03-04 DIAGNOSIS — I5022 Chronic systolic (congestive) heart failure: Secondary | ICD-10-CM

## 2015-03-04 MED ORDER — RAMIPRIL 10 MG PO CAPS: 10.0000 mg | ORAL_CAPSULE | Freq: Two times a day (BID) | ORAL | Status: DC

## 2015-03-05 NOTE — Progress Notes (Signed)
LOOP RECORDER  

## 2015-03-06 NOTE — Progress Notes (Signed)
Error below --- Remote ICD transmission.   

## 2015-03-08 LAB — CUP PACEART REMOTE DEVICE CHECK
Battery Remaining Longevity: 121 mo
Battery Voltage: 3.03 V
Date Time Interrogation Session: 20161024071610
HighPow Impedance: 65 Ohm
Implantable Lead Implant Date: 20140530
Implantable Lead Location: 753860
Implantable Lead Model: 6935
Lead Channel Impedance Value: 399 Ohm
Lead Channel Pacing Threshold Amplitude: 0.5 V
Lead Channel Pacing Threshold Pulse Width: 0.4 ms
Lead Channel Setting Pacing Amplitude: 2.5 V
Lead Channel Setting Pacing Pulse Width: 0.4 ms
Lead Channel Setting Sensing Sensitivity: 0.3 mV
MDC IDC MSMT LEADCHNL RV IMPEDANCE VALUE: 456 Ohm
MDC IDC MSMT LEADCHNL RV SENSING INTR AMPL: 7 mV
MDC IDC MSMT LEADCHNL RV SENSING INTR AMPL: 7 mV
MDC IDC STAT BRADY RV PERCENT PACED: 0.01 %

## 2015-03-13 ENCOUNTER — Encounter: Payer: Self-pay | Admitting: Cardiology

## 2015-04-07 ENCOUNTER — Other Ambulatory Visit: Payer: Self-pay | Admitting: Internal Medicine

## 2015-04-16 ENCOUNTER — Other Ambulatory Visit: Payer: Self-pay | Admitting: Internal Medicine

## 2015-04-30 ENCOUNTER — Telehealth: Payer: Self-pay

## 2015-04-30 NOTE — Telephone Encounter (Signed)
Left Voice Mail for pt to call back.   RE: Flu Vaccine for 2016  

## 2015-05-05 ENCOUNTER — Other Ambulatory Visit: Payer: Self-pay | Admitting: Internal Medicine

## 2015-05-23 ENCOUNTER — Encounter: Payer: Self-pay | Admitting: Internal Medicine

## 2015-05-24 ENCOUNTER — Other Ambulatory Visit: Payer: Self-pay | Admitting: Internal Medicine

## 2015-05-24 ENCOUNTER — Telehealth: Payer: Self-pay

## 2015-05-24 NOTE — Telephone Encounter (Signed)
Patient has chosen new pcp--dr burns--he has schedule march/2017 appt to get established with dr Alfonzo Feller supply of betapace rx sent to patients pharm

## 2015-05-28 ENCOUNTER — Other Ambulatory Visit: Payer: Self-pay | Admitting: Internal Medicine

## 2015-06-03 ENCOUNTER — Ambulatory Visit (INDEPENDENT_AMBULATORY_CARE_PROVIDER_SITE_OTHER): Payer: Managed Care, Other (non HMO) | Admitting: *Deleted

## 2015-06-03 DIAGNOSIS — I429 Cardiomyopathy, unspecified: Secondary | ICD-10-CM | POA: Diagnosis not present

## 2015-06-03 DIAGNOSIS — I5022 Chronic systolic (congestive) heart failure: Secondary | ICD-10-CM | POA: Diagnosis not present

## 2015-06-03 DIAGNOSIS — I428 Other cardiomyopathies: Secondary | ICD-10-CM

## 2015-06-04 NOTE — Progress Notes (Signed)
Remote ICD transmission.   

## 2015-06-05 LAB — CUP PACEART REMOTE DEVICE CHECK
Brady Statistic RV Percent Paced: 0.01 %
Date Time Interrogation Session: 20170123072830
HIGH POWER IMPEDANCE MEASURED VALUE: 64 Ohm
Implantable Lead Implant Date: 20140530
Implantable Lead Location: 753860
Lead Channel Impedance Value: 399 Ohm
Lead Channel Pacing Threshold Amplitude: 0.5 V
Lead Channel Pacing Threshold Pulse Width: 0.4 ms
Lead Channel Sensing Intrinsic Amplitude: 6.75 mV
Lead Channel Setting Pacing Pulse Width: 0.4 ms
Lead Channel Setting Sensing Sensitivity: 0.3 mV
MDC IDC LEAD MODEL: 6935
MDC IDC MSMT BATTERY REMAINING LONGEVITY: 119 mo
MDC IDC MSMT BATTERY VOLTAGE: 3.03 V
MDC IDC MSMT LEADCHNL RV IMPEDANCE VALUE: 456 Ohm
MDC IDC MSMT LEADCHNL RV SENSING INTR AMPL: 6.75 mV
MDC IDC SET LEADCHNL RV PACING AMPLITUDE: 2.5 V

## 2015-06-09 ENCOUNTER — Other Ambulatory Visit: Payer: Self-pay | Admitting: Internal Medicine

## 2015-06-12 ENCOUNTER — Encounter: Payer: Self-pay | Admitting: Cardiology

## 2015-06-26 ENCOUNTER — Encounter: Payer: Self-pay | Admitting: Cardiology

## 2015-07-06 ENCOUNTER — Other Ambulatory Visit: Payer: Self-pay | Admitting: Internal Medicine

## 2015-07-10 ENCOUNTER — Other Ambulatory Visit: Payer: Self-pay | Admitting: Internal Medicine

## 2015-07-22 ENCOUNTER — Other Ambulatory Visit: Payer: Self-pay | Admitting: Internal Medicine

## 2015-07-29 ENCOUNTER — Encounter: Payer: Self-pay | Admitting: Internal Medicine

## 2015-07-29 ENCOUNTER — Ambulatory Visit (INDEPENDENT_AMBULATORY_CARE_PROVIDER_SITE_OTHER): Payer: Managed Care, Other (non HMO) | Admitting: Internal Medicine

## 2015-07-29 VITALS — BP 124/76 | HR 79 | Temp 97.7°F | Resp 16 | Wt 234.0 lb

## 2015-07-29 DIAGNOSIS — I10 Essential (primary) hypertension: Secondary | ICD-10-CM

## 2015-07-29 DIAGNOSIS — E538 Deficiency of other specified B group vitamins: Secondary | ICD-10-CM | POA: Diagnosis not present

## 2015-07-29 DIAGNOSIS — E1165 Type 2 diabetes mellitus with hyperglycemia: Secondary | ICD-10-CM

## 2015-07-29 DIAGNOSIS — E1151 Type 2 diabetes mellitus with diabetic peripheral angiopathy without gangrene: Secondary | ICD-10-CM

## 2015-07-29 DIAGNOSIS — K219 Gastro-esophageal reflux disease without esophagitis: Secondary | ICD-10-CM | POA: Insufficient documentation

## 2015-07-29 DIAGNOSIS — G2581 Restless legs syndrome: Secondary | ICD-10-CM | POA: Diagnosis not present

## 2015-07-29 DIAGNOSIS — IMO0002 Reserved for concepts with insufficient information to code with codable children: Secondary | ICD-10-CM

## 2015-07-29 DIAGNOSIS — E785 Hyperlipidemia, unspecified: Secondary | ICD-10-CM

## 2015-07-29 NOTE — Assessment & Plan Note (Signed)
Check B12 level Was using B12 nasal spray, but has stopped If low - start B12 1000 mcg orally

## 2015-07-29 NOTE — Progress Notes (Signed)
Pre visit review using our clinic review tool, if applicable. No additional management support is needed unless otherwise documented below in the visit note. 

## 2015-07-29 NOTE — Progress Notes (Signed)
Subjective:    Patient ID: [name], male    DOB: 07/13/1950, 65 y.o.   MRN: [id number]  HPI He is here to establish with a new pcp.     Diabetes: He is taking his medication daily as prescribed. He is not compliant with a diabetic diet. He is very active but not exercising regularly. He monitors his sugars and they have been running 110-140's, the 140's is higher than usual for him. He checks his feet daily and denies foot lesions. He is up-to-date with an ophthalmology examination.   Hypertension: He is taking his medication daily. He is not compliant with a low sodium diet.  He denies edema, shortness of breath and regular headaches. He has occasional chest pain and palpitations and cardiology is aware.  He is very active but not exercising regularly.  He does not monitor his blood pressure at home.    RLS:  He does not have frequent symptoms and will take the clonazepam if needed.  He has not needed to take it for a couple of years.    Hyperlipidemia: He is taking his medication daily. He is not compliant with a low fat/cholesterol diet. He is not exercising regularly. He denies myalgias.   GERD, h/o Barrett's:  He is taking his medication daily as prescribed.  He denies any GERD symptoms and feels his GERD is well controlled.    Medications and allergies reviewed with patient and updated if appropriate.  Patient Active Problem List   Diagnosis Date Noted  . Malignant neoplasm of prostate ([place]) 09/27/2014  . Non-compliant behavior 12/06/2013  . Hx of adenomatous colonic polyps 11/29/2012  . RLS (restless legs syndrome) 11/29/2012  . Obstructive sleep apnea 11/10/2012  . Right leg swelling 11/10/2012  . Elevated alkaline phosphatase level 10/04/2012  . Vitamin B 12 deficiency 03/06/2011  . Chronic systolic heart failure ([place]) 01/27/2011  . Mild anemia 01/20/2011  . Dizziness 01/19/2011  . Ventricular tachycardia ([place])   . NICM (nonischemic cardiomyopathy) ([place])   . Type  2 diabetes, uncontrolled, with peripheral circulatory disorder ([place]) 08/26/2009  . HYPERLIPIDEMIA 08/26/2009  . Essential hypertension 08/26/2009  . PROSTATE CANCER, HX OF 08/26/2009  . 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  . Personal history of adenomatous colonic polyps 09/05/2004  . Barrett's esophagus 09/05/2004    Current Outpatient Prescriptions on File Prior to Visit  Medication Sig Dispense Refill  . aspirin 81 MG tablet Take 81 mg by mouth daily.    [name] atorvastatin (LIPITOR) 20 MG tablet Take 1 tablet (20 mg total) by mouth daily. Take 1 tablet by mouth on Sun, Mon, Wed & Fri and take 1/2 tablet by mouth on Tues, Thurs & Sat 90 tablet 1  . carvedilol (COREG) 12.5 MG tablet TAKE 1 AND 1/2 TABLETS BY MOUTH TWICE A DAY WITH MEALS (MAX ON INS) 90 tablet 0  . clonazePAM (KLONOPIN) 0.5 MG tablet 1 nightly if RLS symptoms prevent sleep (Patient taking differently: Take 1 tablet by mouth nightly if RLS symptoms prevent sleep) 30 tablet 2  . digoxin (LANOXIN) 0.125 MG tablet Take 1 tablet (125 mcg total) by mouth daily. Need to establish with new provider. 30 tablet 2  . omeprazole (PRILOSEC) 20 MG capsule TAKE ONE CAPSULE BY MOUTH DAILY 30 MINUTES BEFORE BREAKFAST (MAX ON INS) 90 capsule 0  . ONE TOUCH ULTRA TEST test strip USE TO CHECK BLOOD SUGAR TWICE A DAY DX 250.02 100 each 1  . ramipril (ALTACE) 10 MG  capsule Take 1 capsule (10 mg total) by mouth 2 (two) times daily. Must keep new appt w/new provider for future refills 180 capsule 0  . sitaGLIPtin-metformin (JANUMET) 50-1000 MG per tablet Take 1 tablet by mouth daily with supper. 90 tablet 3  . sotalol (BETAPACE) 120 MG tablet Take 1 tablet (120 mg total) by mouth 2 (two) times daily. --patient has scheduled march/2017 appt with new pcp--dr Cagney Steenson 180 tablet 0   No current facility-administered medications on file prior to visit.    Past Medical History  Diagnosis Date  . Ventricular tachycardia     a.  s/p ICD (generator change 09/2012). b. 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 and placed on sotalol  . Hypertension   . Diabetes mellitus   . NICM (nonischemic cardiomyopathy)     a.normal cors by cath in 2005. b. s/p Medtronic ICD.  [name] Prostate cancer 06/2003    s/p prostatectomy  . Sleep apnea   . Hyperlipidemia   . Chronic systolic heart failure   . Anxiety     since defibrillator placement  . Arthritis     knees  . Cataract   . GERD (gastroesophageal reflux disease)   . Colon polyps     tubular adenoma  . Hx of myocardial infarction   . Pacemaker   . S/P radiation therapy 10/10/2014 through 11/26/2014     Prostate bed 6600 cGy in 33 sessions     Past Surgical History  Procedure Laterality Date  . Cardiac defibrillator placement  03/11/2004    Medtronic Maximo single lead  . Prostatectomy  06/2003    Dr Janice Norrie  . Rectal surgery    . Upper gastrointestinal endoscopy  08/2004, 02/24/11    Barrett's esophagus  . Colonoscopy w/ biopsies and polypectomy  08/2004, 02/24/11    40mm adenoma in 2006,  5 mm polyp (not recovered) 2012  . Implantable cardioverter defibrillator generator change N/A 10/07/2012    Procedure: IMPLANTABLE CARDIOVERTER DEFIBRILLATOR GENERATOR CHANGE;  Surgeon: Evans Lance, MD;  Location[facility] C[facility];  Service: Cardiovascular;  Laterality: N/A;    Social History   Social History  . Marital Status: Married    Spouse Name: N/A  . Number of Children: 2  . 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 Smoking status: Never Smoker   . Smokeless tobacco: Never Used  . Alcohol Use: No  . Drug Use: No  . Sexual Activity: Not on file   Other Topics Concern  . Not on file   Social History Narrative    Family History  Problem Relation Age of Onset  . Coronary artery disease Father   . Prostate cancer Father   . Diabetes Father   .  Hypertension Mother   . Diabetes Paternal Grandmother   . Stroke Neg Hx     Review of Systems  Constitutional: Negative for fever, chills and fatigue.  Respiratory: Negative for cough, shortness of breath and wheezing.   Cardiovascular: Positive for chest pain (rare related irregular heart beat) and palpitations (occasional irregularity in heartbeat). Negative for leg swelling.  Musculoskeletal: Positive for myalgias (muscle aches at times in calves at night).  Neurological: Positive for light-headedness. Negative for dizziness, weakness, numbness and headaches.       Objective:   Filed Vitals:   07/29/15 0804  BP: 124/76  Pulse: 79  Temp: 97.7 F (36.5 C)  Resp: 16   Filed Weights  07/29/15 0804  Weight: 234 lb (106.142 kg)   Body mass index is 32.65 kg/(m^2).   Physical Exam Constitutional: Appears well-developed and well-nourished. No distress.  Neck: Neck supple. No tracheal deviation present. No thyromegaly present.  No carotid bruit. No cervical adenopathy.   Cardiovascular: Normal rate, regular rhythm and normal heart sounds.   No murmur heard.  No edema Pulmonary/Chest: Effort normal and breath sounds normal. No respiratory distress. No wheezes.       Assessment & Plan:   See Problem List for Assessment and Plan of chronic medical problems.  Follow up in 6 months

## 2015-07-29 NOTE — Assessment & Plan Note (Signed)
BP Readings from Last 3 Encounters:  07/29/15 124/76  01/01/15 152/86  11/26/14 157/81   BP well controlled here today Continue current medications

## 2015-07-29 NOTE — Assessment & Plan Note (Signed)
Asymptomatic for the most part  Takes clonazepam as needed - last took it two years ago

## 2015-07-29 NOTE — Assessment & Plan Note (Signed)
Taking lipitor Check lipid panel, cmp

## 2015-07-29 NOTE — Patient Instructions (Signed)

## 2015-07-29 NOTE — Assessment & Plan Note (Signed)
Sugars slightly higher than normal at home, but still controlled Check a1c Adjust medication if needed Advised increasing his exercise and improve compliance with a diabetic diet Advised weight loss

## 2015-07-29 NOTE — Assessment & Plan Note (Signed)
Controlled H/o barrett's Continue omeprazole daily

## 2015-08-05 ENCOUNTER — Other Ambulatory Visit (INDEPENDENT_AMBULATORY_CARE_PROVIDER_SITE_OTHER): Payer: Managed Care, Other (non HMO)

## 2015-08-05 DIAGNOSIS — I10 Essential (primary) hypertension: Secondary | ICD-10-CM | POA: Diagnosis not present

## 2015-08-05 DIAGNOSIS — E538 Deficiency of other specified B group vitamins: Secondary | ICD-10-CM | POA: Diagnosis not present

## 2015-08-05 DIAGNOSIS — G2581 Restless legs syndrome: Secondary | ICD-10-CM | POA: Diagnosis not present

## 2015-08-05 DIAGNOSIS — E1151 Type 2 diabetes mellitus with diabetic peripheral angiopathy without gangrene: Secondary | ICD-10-CM

## 2015-08-05 DIAGNOSIS — IMO0002 Reserved for concepts with insufficient information to code with codable children: Secondary | ICD-10-CM

## 2015-08-05 DIAGNOSIS — E1165 Type 2 diabetes mellitus with hyperglycemia: Secondary | ICD-10-CM

## 2015-08-05 DIAGNOSIS — E785 Hyperlipidemia, unspecified: Secondary | ICD-10-CM

## 2015-08-05 LAB — LIPID PANEL
Cholesterol: 92 mg/dL (ref 0–200)
HDL: 29 mg/dL — ABNORMAL LOW (ref >39.00)
LDL Cholesterol: 48 mg/dL (ref 0–99)
NonHDL: 63.07
TRIGLYCERIDES: 73 mg/dL (ref 0.0–149.0)
Total CHOL/HDL Ratio: 3
VLDL: 14.6 mg/dL (ref 0.0–40.0)

## 2015-08-05 LAB — COMPREHENSIVE METABOLIC PANEL
ALT: 19 U/L (ref 0–53)
AST: 16 U/L (ref 0–37)
Albumin: 4 g/dL (ref 3.5–5.2)
Alkaline Phosphatase: 138 U/L — ABNORMAL HIGH (ref 39–117)
BUN: 15 mg/dL (ref 6–23)
CHLORIDE: 107 meq/L (ref 96–112)
CO2: 28 mEq/L (ref 19–32)
Calcium: 9.1 mg/dL (ref 8.4–10.5)
Creatinine, Ser: 1.05 mg/dL (ref 0.40–1.50)
GFR: 91.39 mL/min (ref >60.00)
GLUCOSE: 117 mg/dL — AB (ref 70–99)
POTASSIUM: 4 meq/L (ref 3.5–5.1)
SODIUM: 141 meq/L (ref 135–145)
Total Bilirubin: 0.5 mg/dL (ref 0.2–1.2)
Total Protein: 7.5 g/dL (ref 6.0–8.3)

## 2015-08-05 LAB — CBC WITH DIFFERENTIAL/PLATELET
BASOS PCT: 0.1 % (ref 0.0–3.0)
Basophils Absolute: 0 10*3/uL (ref 0.0–0.1)
Eosinophils Absolute: 0.1 10*3/uL (ref 0.0–0.7)
Eosinophils Relative: 1.7 % (ref 0.0–5.0)
HEMATOCRIT: 36.1 % — AB (ref 39.0–52.0)
Hemoglobin: 11.8 g/dL — ABNORMAL LOW (ref 13.0–17.0)
LYMPHS PCT: 19.1 % (ref 12.0–46.0)
Lymphs Abs: 1.2 10*3/uL (ref 0.7–4.0)
MCHC: 32.5 g/dL (ref 30.0–36.0)
MCV: 73 fl — ABNORMAL LOW (ref 78.0–100.0)
Monocytes Absolute: 0.5 10*3/uL (ref 0.1–1.0)
Monocytes Relative: 8.4 % (ref 3.0–12.0)
NEUTROS ABS: 4.6 10*3/uL (ref 1.4–7.7)
Neutrophils Relative %: 70.7 % (ref 43.0–77.0)
PLATELETS: 227 10*3/uL (ref 150.0–400.0)
RBC: 4.95 Mil/uL (ref 4.22–5.81)
RDW: 16.7 % — AB (ref 11.5–15.5)
WBC: 6.5 10*3/uL (ref 4.0–10.5)

## 2015-08-05 LAB — HEMOGLOBIN A1C: Hgb A1c MFr Bld: 6.9 % — ABNORMAL HIGH (ref 4.6–6.5)

## 2015-08-05 LAB — TSH: TSH: 2.48 u[IU]/mL (ref 0.35–4.50)

## 2015-08-05 LAB — HEPATITIS C ANTIBODY: HCV AB: NEGATIVE

## 2015-08-05 LAB — VITAMIN B12: VITAMIN B 12: 234 pg/mL (ref 211–911)

## 2015-08-07 ENCOUNTER — Other Ambulatory Visit: Payer: Self-pay | Admitting: Internal Medicine

## 2015-08-09 ENCOUNTER — Encounter: Payer: Self-pay | Admitting: Internal Medicine

## 2015-08-09 NOTE — Telephone Encounter (Signed)
Please advise 

## 2015-08-11 ENCOUNTER — Encounter: Payer: Self-pay | Admitting: Internal Medicine

## 2015-08-16 ENCOUNTER — Other Ambulatory Visit: Payer: Self-pay | Admitting: Internal Medicine

## 2015-08-16 ENCOUNTER — Telehealth: Payer: Self-pay | Admitting: Internal Medicine

## 2015-08-16 MED ORDER — CARVEDILOL 12.5 MG PO TABS: ORAL_TABLET | ORAL | Status: DC

## 2015-08-16 NOTE — Telephone Encounter (Signed)
Pt called in said that he is completely out of this med and needs this refilled

## 2015-08-16 NOTE — Telephone Encounter (Signed)
Patient states he is out of carvedilol and needs sent to [facility] on [address] in [place].

## 2015-08-21 ENCOUNTER — Other Ambulatory Visit: Payer: Self-pay | Admitting: Internal Medicine

## 2015-09-09 ENCOUNTER — Other Ambulatory Visit: Payer: Self-pay

## 2015-09-09 MED ORDER — RAMIPRIL 10 MG PO CAPS: 10.0000 mg | ORAL_CAPSULE | Freq: Two times a day (BID) | ORAL | Status: DC

## 2015-09-10 ENCOUNTER — Encounter: Payer: Self-pay | Admitting: Internal Medicine

## 2015-09-10 ENCOUNTER — Other Ambulatory Visit: Payer: Self-pay

## 2015-09-10 ENCOUNTER — Ambulatory Visit (INDEPENDENT_AMBULATORY_CARE_PROVIDER_SITE_OTHER): Payer: Managed Care, Other (non HMO) | Admitting: Internal Medicine

## 2015-09-10 VITALS — BP 146/88 | HR 71 | Ht 71.0 in | Wt 235.6 lb

## 2015-09-10 DIAGNOSIS — I5022 Chronic systolic (congestive) heart failure: Secondary | ICD-10-CM | POA: Diagnosis not present

## 2015-09-10 LAB — CUP PACEART INCLINIC DEVICE CHECK
Battery Voltage: 3.02 V
Brady Statistic RV Percent Paced: 0.01 %
Date Time Interrogation Session: 20170502130536
HIGH POWER IMPEDANCE MEASURED VALUE: 58 Ohm
Lead Channel Impedance Value: 399 Ohm
Lead Channel Pacing Threshold Pulse Width: 0.4 ms
Lead Channel Sensing Intrinsic Amplitude: 7.625 mV
Lead Channel Setting Pacing Amplitude: 2.5 V
Lead Channel Setting Pacing Pulse Width: 0.4 ms
MDC IDC LEAD IMPLANT DT: 20140530
MDC IDC LEAD LOCATION: 753860
MDC IDC LEAD MODEL: 6935
MDC IDC MSMT BATTERY REMAINING LONGEVITY: 117 mo
MDC IDC MSMT LEADCHNL RV IMPEDANCE VALUE: 456 Ohm
MDC IDC MSMT LEADCHNL RV PACING THRESHOLD AMPLITUDE: 0.5 V
MDC IDC SET LEADCHNL RV SENSING SENSITIVITY: 0.3 mV

## 2015-09-10 MED ORDER — SITAGLIPTIN PHOS-METFORMIN HCL 50-1000 MG PO TABS: 1.0000 | ORAL_TABLET | Freq: Every day | ORAL | Status: DC

## 2015-09-10 NOTE — Progress Notes (Signed)
HPI [name] returns today for followup. He is a 65 yo man with a long standing non-ischemic CM, s/p ICD implantation, with a h/o VT, HTN, and chronic class 2 systolic heart failure. In the interim, he continues to work full time. He has had no ICD shocks. He notes an occasional sensation like his heart is beating in reverse. He denies alcohol, tobacco, sodium, or other abuses. No Known Allergies     Current Outpatient Prescriptions  Medication Sig Dispense Refill  . aspirin 81 MG tablet Take 81 mg by mouth daily.    [name] atorvastatin (LIPITOR) 20 MG tablet Take 1 tablet (20 mg total) by mouth daily. Take 1 tablet by mouth on Sun, Mon, Wed & Fri and take 1/2 tablet by mouth on Tues, Thurs & Sat 90 tablet 1  . carvedilol (COREG) 12.5 MG tablet TAKE 1 AND 1/2 TABLETS BY MOUTH TWICE A DAY WITH MEALS (MAX ON INS) 90 tablet 5  . clonazePAM (KLONOPIN) 0.5 MG tablet Take 0.5 mg by mouth as directed.    . digoxin (LANOXIN) 0.125 MG tablet TAKE 1 TABLET BY MOUTH DAILY 30 tablet 2  . omeprazole (PRILOSEC) 20 MG capsule TAKE ONE CAPSULE BY MOUTH DAILY 30 MINUTES BEFORE BREAKFAST (MAX ON INS) 90 capsule 0  . ONE TOUCH ULTRA TEST test strip USE TO CHECK BLOOD SUGAR TWICE A DAY DX 250.02 100 each 1  . ramipril (ALTACE) 10 MG capsule Take 1 capsule (10 mg total) by mouth 2 (two) times daily. 180 capsule 2  . sitaGLIPtin-metformin (JANUMET) 50-1000 MG per tablet Take 1 tablet by mouth daily with supper. 90 tablet 3  . sotalol (BETAPACE) 120 MG tablet TAKE 1 TABLET BY MOUTH TWICE A DAY (MAX ON INS) 180 tablet 3   No current facility-administered medications for this visit.     Past Medical History  Diagnosis Date  . Ventricular tachycardia ([place])     a. s/p ICD (generator change 09/2012). b. 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 and placed on sotalol  . Hypertension   . Diabetes mellitus   . NICM (nonischemic cardiomyopathy) ([place])     a.normal cors by cath in 2005. b. s/p Medtronic ICD.  [name] Prostate cancer ([place])  06/2003    s/p prostatectomy  . Sleep apnea   . Hyperlipidemia   . Chronic systolic heart failure ([place])   . Anxiety     since defibrillator placement  . Arthritis     knees  . Cataract   . GERD (gastroesophageal reflux disease)   . Colon polyps     tubular adenoma  . Hx of myocardial infarction   . Pacemaker   . S/P radiation therapy 10/10/2014 through 11/26/2014     Prostate bed 6600 cGy in 33 sessions     ROS:   All systems reviewed and negative except as noted in the HPI.   Past Surgical History  Procedure Laterality Date  . Cardiac defibrillator placement  03/11/2004    Medtronic Maximo single lead  . Prostatectomy  06/2003    Dr Janice Norrie  . Rectal surgery    . Upper gastrointestinal endoscopy  08/2004, 02/24/11    Barrett's esophagus  . Colonoscopy w/ biopsies and polypectomy  08/2004, 02/24/11    69mm adenoma in 2006,  5 mm polyp (not recovered) 2012  . Implantable cardioverter defibrillator generator change N/A 10/07/2012    Procedure: IMPLANTABLE CARDIOVERTER DEFIBRILLATOR GENERATOR CHANGE;  Surgeon: Evans Lance, MD;  Location: [facility] CATH LAB;  Service: Cardiovascular;  Laterality: N/A;     Family History  Problem Relation Age of Onset  . Coronary artery disease Father   . Prostate cancer Father   . Diabetes Father   . Hypertension Mother   . Diabetes Paternal Grandmother   . Stroke Neg Hx      Social History   Social History  . Marital Status: Married    Spouse Name: N/A  . Number of Children: 2  . 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 Smoking status: Never Smoker   . Smokeless tobacco: Never Used  . Alcohol Use: No  . Drug Use: No  . Sexual Activity: Not on file   Other Topics Concern  . Not on file   Social History Narrative     BP 146/88 mmHg  Pulse 71  Ht 5\' 11"  (1.803 m)  Wt 235 lb 9.6 oz  (106.867 kg)  BMI 32.87 kg/m2  Physical Exam:  Well appearing 65 year old man, NAD HEENT: Unremarkable Neck:  7 cm JVD, no thyromegally Lymphatics:  No adenopathy Back:  No CVA tenderness Lungs:  Clear, with no wheezes, rales, or rhonchi. Well-healed ICD incision. HEART:  Regular rate rhythm, no murmurs, no rubs, no clicks Abd:  soft, positive bowel sounds, no organomegally, no rebound, no guarding Ext:  2 plus pulses, no edema, no cyanosis, no clubbing Skin:  No rashes no nodules Neuro:  CN II through XII intact, motor grossly intact  ECG - nsr with lvh  DEVICE  Normal device function.  See PaceArt for details.   Assess/Plan: 1. Chronic systolic heart failure - he is class 2A. He will continue his medical therapy. 2. VT - he has had no recurrent ventricular arrhythmias. No change in his meds. 3. ICD - his medtronic ICD is working normally with almost 10 years of battery longevity.  [name].D.

## 2015-09-10 NOTE — Patient Instructions (Signed)
Medication Instructions:  Your physician recommends that you continue on your current medications as directed. Please refer to the Current Medication list given to you today.   Labwork: None ordered   Testing/Procedures: None ordered   Follow-Up: Your physician wants you to follow-up in: 12 months with Dr Knox Saliva will receive a reminder letter in the mail two months in advance. If you don't receive a letter, please call our office to schedule the follow-up appointment.  Remote monitoring is used to monitor your  ICD from home. This monitoring reduces the number of office visits required to check your device to one time per year. It allows [place] to keep an eye on the functioning of your device to ensure it is working properly. You are scheduled for a device check from home on 12/10/15. You may send your transmission at any time that day. If you have a wireless device, the transmission will be sent automatically. After your physician reviews your transmission, you will receive a postcard with your next transmission date.     Any Other Special Instructions Will Be Listed Below (If Applicable).     If you need a refill on your cardiac medications before your next appointment, please call your pharmacy.

## 2015-09-18 ENCOUNTER — Other Ambulatory Visit: Payer: Self-pay | Admitting: Urology

## 2015-09-30 NOTE — Patient Instructions (Signed)
[name]  09/30/2015   Your procedure is scheduled on: 10/08/2015    Report to [facility]  Entrance take [place]  elevators to 3rd floor to  [place] at   0700 AM.  Call this number if you have problems the morning of surgery [phone]   Remember: ONLY 1 PERSON MAY GO WITH YOU TO SHORT STAY TO GET  READY MORNING OF YOUR SURGERY.  Do not eat food or drink liquids :After Midnight.             Eat a good healthy snack prior to bedtime.       Take these medicines the morning of surgery with A SIP OF WATER: Carvedilol ( coreg), Clonazepam if needed, Lanoxin ( Digoxin), Prilosec( Omeprazole), Sotalol ( Betapace) DO NOT TAKE ANY DIABETIC MEDICATIONS DAY OF YOUR SURGERY                               You may not have any metal on your body including hair pins and              piercings  Do not wear jewelry,  lotions, powders or perfumes, deodorant                        Men may shave face and neck.   Do not bring valuables to the hospital. [place].  Contacts, dentures or bridgework may not be worn into surgery.       Patients discharged the day of surgery will not be allowed to drive home.  Name and phone number of your driver:  Special Instructions: coughing and deep breathing exercises, leg exercises               Please read over the following fact sheets you were given: _____________________________________________________________________             [facility] - Preparing for Surgery Before surgery, you can play an important role.  Because skin is not sterile, your skin needs to be as free of germs as possible.  You can reduce the number of germs on your skin by washing with CHG (chlorahexidine gluconate) soap before surgery.  CHG is an antiseptic cleaner which kills germs and bonds with the skin to continue killing germs even after washing. Please DO NOT use if you have an allergy to  CHG or antibacterial soaps.  If your skin becomes reddened/irritated stop using the CHG and inform your nurse when you arrive at Short Stay. Do not shave (including legs and underarms) for at least 48 hours prior to the first CHG shower.  You may shave your face/neck. Please follow these instructions carefully:  1.  Shower with CHG Soap the night before surgery and the  morning of Surgery.  2.  If you choose to wash your hair, wash your hair first as usual with your  normal  shampoo.  3.  After you shampoo, rinse your hair and body thoroughly to remove the  shampoo.                           4.  Use CHG as you would any other  liquid soap.  You can apply chg directly  to the skin and wash                       Gently with a scrungie or clean washcloth.  5.  Apply the CHG Soap to your body ONLY FROM THE NECK DOWN.   Do not use on face/ open                           Wound or open sores. Avoid contact with eyes, ears mouth and genitals (private parts).                       Wash face,  Genitals (private parts) with your normal soap.             6.  Wash thoroughly, paying special attention to the area where your surgery  will be performed.  7.  Thoroughly rinse your body with warm water from the neck down.  8.  DO NOT shower/wash with your normal soap after using and rinsing off  the CHG Soap.                9.  Pat yourself dry with a clean towel.            10.  Wear clean pajamas.            11.  Place clean sheets on your bed the night of your first shower and do not  sleep with pets. Day of Surgery : Do not apply any lotions/deodorants the morning of surgery.  Please wear clean clothes to the hospital/surgery center.  FAILURE TO FOLLOW THESE INSTRUCTIONS MAY RESULT IN THE CANCELLATION OF YOUR SURGERY PATIENT SIGNATURE_________________________________  NURSE SIGNATURE__________________________________  ________________________________________________________________________

## 2015-10-01 ENCOUNTER — Inpatient Hospital Stay (HOSPITAL_COMMUNITY)
Admission: RE | Admit: 2015-10-01 | Discharge: 2015-10-01 | Disposition: A | Discharge status: Home / Self Care | Payer: Managed Care, Other (non HMO) | Source: Ambulatory Visit

## 2015-10-04 ENCOUNTER — Encounter (HOSPITAL_COMMUNITY): Payer: Self-pay

## 2015-10-04 ENCOUNTER — Other Ambulatory Visit: Payer: Self-pay | Admitting: Internal Medicine

## 2015-10-04 ENCOUNTER — Encounter (HOSPITAL_COMMUNITY)
Admission: RE | Admit: 2015-10-04 | Discharge: 2015-10-04 | Disposition: A | Discharge status: Home / Self Care | Payer: Managed Care, Other (non HMO) | Source: Ambulatory Visit | Attending: Urology | Admitting: Urology

## 2015-10-04 DIAGNOSIS — N359 Urethral stricture, unspecified: Secondary | ICD-10-CM | POA: Insufficient documentation

## 2015-10-04 DIAGNOSIS — Z01812 Encounter for preprocedural laboratory examination: Secondary | ICD-10-CM | POA: Insufficient documentation

## 2015-10-04 HISTORY — DX: Cardiac arrhythmia, unspecified: I49.9

## 2015-10-04 HISTORY — DX: Presence of automatic (implantable) cardiac defibrillator: Z95.810

## 2015-10-04 LAB — CBC
HEMATOCRIT: 36.7 % — AB (ref 39.0–52.0)
HEMOGLOBIN: 11.8 g/dL — AB (ref 13.0–17.0)
MCH: 23.5 pg — ABNORMAL LOW (ref 26.0–34.0)
MCHC: 32.2 g/dL (ref 30.0–36.0)
MCV: 73.1 fL — ABNORMAL LOW (ref 78.0–100.0)
Platelets: 245 10*3/uL (ref 150–400)
RBC: 5.02 MIL/uL (ref 4.22–5.81)
RDW: 16.2 % — AB (ref 11.5–15.5)
WBC: 7.5 10*3/uL (ref 4.0–10.5)

## 2015-10-04 LAB — BASIC METABOLIC PANEL
ANION GAP: 4 — AB (ref 5–15)
BUN: 12 mg/dL (ref 6–20)
CHLORIDE: 107 mmol/L (ref 101–111)
CO2: 27 mmol/L (ref 22–32)
Calcium: 9 mg/dL (ref 8.9–10.3)
Creatinine, Ser: 0.94 mg/dL (ref 0.61–1.24)
Glucose, Bld: 129 mg/dL — ABNORMAL HIGH (ref 65–99)
POTASSIUM: 3.9 mmol/L (ref 3.5–5.1)
SODIUM: 138 mmol/L (ref 135–145)

## 2015-10-04 NOTE — Patient Instructions (Addendum)
WEN GOICOECHEA  10/04/2015   Your procedure is scheduled on: 10/08/15  Report to [facility]  Entrance take [facility]  elevators to 3rd floor to  [place] at 7:00 AM.  Call this number if you have problems the morning of surgery [phone]   Remember: ONLY 1 PERSON MAY GO WITH YOU TO SHORT STAY TO GET  READY MORNING OF [place].  Do not eat food or drink liquids :After Midnight.     Take these medicines the morning of surgery with A SIP OF WATER: Carvidolol (Coreg), Clonazepam (Klonopin) if needed, Lanoxin (Dgioxin), Omeprazole (Prilosec), Sotalol (Detapace)  DO NOT TAKE ANY DIABETIC MEDICATIONS DAY OF YOUR SURGERY                               You may not have any metal on your body including hair pins and              piercings  Do not wear jewelry, make-up, lotions, powders or perfumes, deodorant             Do not wear nail polish.  Do not shave  48 hours prior to surgery.              Men may shave face and neck.   Do not bring valuables to the hospital. [place].  Contacts, dentures or bridgework may not be worn into surgery.  Leave suitcase in the car. After surgery it may be brought to your room.     Patients discharged the day of surgery will not be allowed to drive home.  Name and phone number of your driver: Neoma Laming (wife) [phone]               Please read over the following fact sheets you were given: _____________________________________________________________________             [facility] - Preparing for Surgery Before surgery, you can play an important role.  Because skin is not sterile, your skin needs to be as free of germs as possible.  You can reduce the number of germs on your skin by washing with CHG (chlorahexidine gluconate) soap before surgery.  CHG is an antiseptic cleaner which kills germs and bonds with the skin to continue killing germs even after  washing. Please DO NOT use if you have an allergy to CHG or antibacterial soaps.  If your skin becomes reddened/irritated stop using the CHG and inform your nurse when you arrive at Short Stay. Do not shave (including legs and underarms) for at least 48 hours prior to the first CHG shower.  You may shave your face/neck. Please follow these instructions carefully:  1.  Shower with CHG Soap the night before surgery and the  morning of Surgery.  2.  If you choose to wash your hair, wash your hair first as usual with your  normal  shampoo.  3.  After you shampoo, rinse your hair and body thoroughly to remove the  shampoo.                           4.  Use CHG as you would any other liquid soap.  You  can apply chg directly  to the skin and wash                       Gently with a scrungie or clean washcloth.  5.  Apply the CHG Soap to your body ONLY FROM THE NECK DOWN.   Do not use on face/ open                           Wound or open sores. Avoid contact with eyes, ears mouth and genitals (private parts).                       Wash face,  Genitals (private parts) with your normal soap.             6.  Wash thoroughly, paying special attention to the area where your surgery  will be performed.  7.  Thoroughly rinse your body with warm water from the neck down.  8.  DO NOT shower/wash with your normal soap after using and rinsing off  the CHG Soap.                9.  Pat yourself dry with a clean towel.            10.  Wear clean pajamas.            11.  Place clean sheets on your bed the night of your first shower and do not  sleep with pets. Day of Surgery : Do not apply any lotions/deodorants the morning of surgery.  Please wear clean clothes to the hospital/surgery center.  FAILURE TO FOLLOW THESE INSTRUCTIONS MAY RESULT IN THE CANCELLATION OF YOUR SURGERY PATIENT SIGNATURE_________________________________  NURSE  SIGNATURE__________________________________  ________________________________________________________________________

## 2015-10-04 NOTE — Pre-Procedure Instructions (Addendum)
LOV Cardiac 09/10/15 epic Last ICD device check 09/10/15 EKG 09/10/15 epic Hgb A1C 08/05/15 epic  Cardiac device sheet signed and on chart.

## 2015-10-05 LAB — HEMOGLOBIN A1C
HEMOGLOBIN A1C: 6.8 % — AB (ref 4.8–5.6)
Mean Plasma Glucose: 148 mg/dL

## 2015-10-07 NOTE — Anesthesia Preprocedure Evaluation (Signed)
Anesthesia Evaluation  Patient identified by MRN, date of birth, ID band Patient awake    Reviewed: Allergy & Precautions, H&P , NPO status , Patient's Chart, lab work & pertinent test results  History of Anesthesia Complications Negative for: history of anesthetic complications  Airway Mallampati: II  TM Distance: >3 FB Neck ROM: full    Dental no notable dental hx.    Pulmonary sleep apnea ,    Pulmonary exam normal breath sounds clear to auscultation       Cardiovascular hypertension, Pt. on medications Normal cardiovascular exam+ dysrhythmias + Cardiac Defibrillator  Rhythm:regular Rate:Normal  History of non-ischemic systolic dysfunction with decreased EF about 30% by 2005 Echo, had VT episode in 2012 that was treated with sotalol and ICD placement, no shocks since then and continues to work with Class 2 heart failure symptoms. Normal Coronary cath in 2005.  Follows cardiology regularly   Neuro/Psych Anxiety negative neurological ROS     GI/Hepatic negative GI ROS, Neg liver ROS,   Endo/Other  diabetes  Renal/GU negative Renal ROS     Musculoskeletal   Abdominal   Peds  Hematology negative hematology ROS (+)   Anesthesia Other Findings   Reproductive/Obstetrics negative OB ROS                             Anesthesia Physical Anesthesia Plan  ASA: III  Anesthesia Plan: General   Post-op Pain Management:    Induction: Intravenous  Airway Management Planned: LMA  Additional Equipment:   Intra-op Plan:   Post-operative Plan:   Informed Consent: I have reviewed the patients History and Physical, chart, labs and discussed the procedure including the risks, benefits and alternatives for the proposed anesthesia with the patient or authorized representative who has indicated his/her understanding and acceptance.   Dental Advisory Given  Plan Discussed with: Anesthesiologist,  CRNA and Surgeon  Anesthesia Plan Comments:         Anesthesia Quick Evaluation

## 2015-10-08 ENCOUNTER — Encounter (HOSPITAL_COMMUNITY)
Admission: RE | Disposition: A | Discharge status: Home / Self Care | Payer: Self-pay | Source: Ambulatory Visit | Attending: Urology

## 2015-10-08 ENCOUNTER — Ambulatory Visit (HOSPITAL_COMMUNITY): Payer: Managed Care, Other (non HMO) | Admitting: Anesthesiology

## 2015-10-08 ENCOUNTER — Ambulatory Visit (HOSPITAL_COMMUNITY)
Admission: RE | Admit: 2015-10-08 | Discharge: 2015-10-08 | Disposition: A | Discharge status: Home / Self Care | Payer: Managed Care, Other (non HMO) | Source: Ambulatory Visit | Attending: Urology | Admitting: Urology

## 2015-10-08 ENCOUNTER — Encounter (HOSPITAL_COMMUNITY): Payer: Self-pay | Admitting: *Deleted

## 2015-10-08 DIAGNOSIS — Z79899 Other long term (current) drug therapy: Secondary | ICD-10-CM | POA: Diagnosis not present

## 2015-10-08 DIAGNOSIS — G4730 Sleep apnea, unspecified: Secondary | ICD-10-CM | POA: Diagnosis not present

## 2015-10-08 DIAGNOSIS — Z95810 Presence of automatic (implantable) cardiac defibrillator: Secondary | ICD-10-CM | POA: Diagnosis not present

## 2015-10-08 DIAGNOSIS — F419 Anxiety disorder, unspecified: Secondary | ICD-10-CM | POA: Diagnosis not present

## 2015-10-08 DIAGNOSIS — Z7984 Long term (current) use of oral hypoglycemic drugs: Secondary | ICD-10-CM | POA: Insufficient documentation

## 2015-10-08 DIAGNOSIS — I10 Essential (primary) hypertension: Secondary | ICD-10-CM | POA: Diagnosis not present

## 2015-10-08 DIAGNOSIS — N359 Urethral stricture, unspecified: Secondary | ICD-10-CM | POA: Diagnosis not present

## 2015-10-08 DIAGNOSIS — Z9079 Acquired absence of other genital organ(s): Secondary | ICD-10-CM | POA: Insufficient documentation

## 2015-10-08 DIAGNOSIS — Z8546 Personal history of malignant neoplasm of prostate: Secondary | ICD-10-CM | POA: Diagnosis not present

## 2015-10-08 DIAGNOSIS — Z7982 Long term (current) use of aspirin: Secondary | ICD-10-CM | POA: Insufficient documentation

## 2015-10-08 DIAGNOSIS — I252 Old myocardial infarction: Secondary | ICD-10-CM | POA: Insufficient documentation

## 2015-10-08 DIAGNOSIS — E119 Type 2 diabetes mellitus without complications: Secondary | ICD-10-CM | POA: Insufficient documentation

## 2015-10-08 DIAGNOSIS — Z923 Personal history of irradiation: Secondary | ICD-10-CM | POA: Diagnosis not present

## 2015-10-08 HISTORY — PX: CYSTOSCOPY WITH DIRECT VISION INTERNAL URETHROTOMY: SHX6637

## 2015-10-08 LAB — GLUCOSE, CAPILLARY
GLUCOSE-CAPILLARY: 122 mg/dL — AB (ref 65–99)
Glucose-Capillary: 113 mg/dL — ABNORMAL HIGH (ref 65–99)

## 2015-10-08 SURGERY — CYSTOSCOPY, WITH DIRECT VISION INTERNAL URETHROTOMY
Anesthesia: General

## 2015-10-08 MED ORDER — MIDAZOLAM HCL 5 MG/5ML IJ SOLN
INTRAMUSCULAR | Status: DC | PRN
  Administered 2015-10-08: 2 mg via INTRAVENOUS

## 2015-10-08 MED ORDER — CEFAZOLIN SODIUM-DEXTROSE 2-4 GM/100ML-% IV SOLN
2.0000 g | INTRAVENOUS | Status: AC
  Administered 2015-10-08: 2 g via INTRAVENOUS
  Filled 2015-10-08: qty 100

## 2015-10-08 MED ORDER — MIDAZOLAM HCL 2 MG/2ML IJ SOLN
INTRAMUSCULAR | Status: AC
  Filled 2015-10-08: qty 2

## 2015-10-08 MED ORDER — ONDANSETRON HCL 4 MG/2ML IJ SOLN
INTRAMUSCULAR | Status: DC | PRN
  Administered 2015-10-08: 4 mg via INTRAVENOUS

## 2015-10-08 MED ORDER — LIDOCAINE HCL (CARDIAC) 20 MG/ML IV SOLN
INTRAVENOUS | Status: AC
  Filled 2015-10-08: qty 5

## 2015-10-08 MED ORDER — FENTANYL CITRATE (PF) 100 MCG/2ML IJ SOLN
INTRAMUSCULAR | Status: DC | PRN
  Administered 2015-10-08 (×2): 25 ug via INTRAVENOUS

## 2015-10-08 MED ORDER — PROPOFOL 10 MG/ML IV BOLUS
INTRAVENOUS | Status: DC | PRN
  Administered 2015-10-08: 40 mg via INTRAVENOUS
  Administered 2015-10-08: 20 mg via INTRAVENOUS
  Administered 2015-10-08: 100 mg via INTRAVENOUS

## 2015-10-08 MED ORDER — OXYCODONE HCL 5 MG/5ML PO SOLN
5.0000 mg | Freq: Once | ORAL | Status: DC | PRN
  Filled 2015-10-08: qty 5

## 2015-10-08 MED ORDER — ONDANSETRON HCL 4 MG/2ML IJ SOLN
INTRAMUSCULAR | Status: AC
  Filled 2015-10-08: qty 2

## 2015-10-08 MED ORDER — FENTANYL CITRATE (PF) 100 MCG/2ML IJ SOLN: 25.0000 ug | INTRAMUSCULAR | Status: DC | PRN

## 2015-10-08 MED ORDER — ONDANSETRON HCL 4 MG/2ML IJ SOLN
4.0000 mg | Freq: Once | INTRAMUSCULAR | Status: DC | PRN
Start: 2015-10-08 — End: 2015-10-08

## 2015-10-08 MED ORDER — DIATRIZOATE MEGLUMINE 30 % UR SOLN
URETHRAL | Status: AC
  Filled 2015-10-08: qty 100

## 2015-10-08 MED ORDER — FENTANYL CITRATE (PF) 100 MCG/2ML IJ SOLN
INTRAMUSCULAR | Status: AC
  Filled 2015-10-08: qty 2

## 2015-10-08 MED ORDER — HYDROCODONE-ACETAMINOPHEN 5-325 MG PO TABS: 1.0000 | ORAL_TABLET | Freq: Four times a day (QID) | ORAL | Status: DC | PRN

## 2015-10-08 MED ORDER — PHENYLEPHRINE HCL 10 MG/ML IJ SOLN
INTRAMUSCULAR | Status: DC | PRN
  Administered 2015-10-08 (×3): 80 ug via INTRAVENOUS

## 2015-10-08 MED ORDER — PROPOFOL 10 MG/ML IV BOLUS
INTRAVENOUS | Status: AC
  Filled 2015-10-08: qty 20

## 2015-10-08 MED ORDER — CEFAZOLIN SODIUM-DEXTROSE 2-4 GM/100ML-% IV SOLN
INTRAVENOUS | Status: AC
  Filled 2015-10-08: qty 100

## 2015-10-08 MED ORDER — LACTATED RINGERS IV SOLN
INTRAVENOUS | Status: DC
  Administered 2015-10-08: 08:00:00 via INTRAVENOUS

## 2015-10-08 MED ORDER — OXYCODONE HCL 5 MG PO TABS
5.0000 mg | ORAL_TABLET | Freq: Once | ORAL | Status: DC | PRN
Start: 2015-10-08 — End: 2015-10-08

## 2015-10-08 MED ORDER — LIDOCAINE HCL (CARDIAC) 20 MG/ML IV SOLN
INTRAVENOUS | Status: DC | PRN
  Administered 2015-10-08: 50 mg via INTRAVENOUS

## 2015-10-08 MED ORDER — CEPHALEXIN 500 MG PO CAPS
500.0000 mg | ORAL_CAPSULE | Freq: Three times a day (TID) | ORAL | Status: DC
Start: 2015-10-08 — End: 2016-01-29

## 2015-10-08 SURGICAL SUPPLY — 12 items
BAG URO CATCHER STRL LF (MISCELLANEOUS) ×3 IMPLANT
CATH FOLEY 2W COUNCIL 5CC 16FR (CATHETERS) ×2 IMPLANT
CATH INTERMIT  6FR 70CM (CATHETERS) IMPLANT
CLOTH BEACON ORANGE TIMEOUT ST (SAFETY) ×3 IMPLANT
GLOVE BIO SURGEON STRL SZ7.5 (GLOVE) ×3 IMPLANT
GOWN STRL REUS W/TWL LRG LVL3 (GOWN DISPOSABLE) ×6 IMPLANT
GUIDEWIRE ANG ZIPWIRE 038X150 (WIRE) IMPLANT
GUIDEWIRE STR DUAL SENSOR (WIRE) ×2 IMPLANT
MANIFOLD NEPTUNE II (INSTRUMENTS) ×3 IMPLANT
PACK CYSTO (CUSTOM PROCEDURE TRAY) ×3 IMPLANT
TUBING CONNECTING 10 (TUBING) ×2 IMPLANT
TUBING CONNECTING 10' (TUBING) ×1

## 2015-10-08 NOTE — Anesthesia Postprocedure Evaluation (Signed)
Anesthesia Post Note  Patient: [name]  Procedure(s) Performed: Procedure(s) (LRB): CYSTOSCOPY WITH DIRECT VISION INTERNAL URETHROTOMY (N/A)  Patient location during evaluation: PACU Anesthesia Type: General Level of consciousness: awake and alert Pain management: pain level controlled Vital Signs Assessment: post-procedure vital signs reviewed and stable Respiratory status: spontaneous breathing, nonlabored ventilation, respiratory function stable and patient connected to nasal cannula oxygen Cardiovascular status: blood pressure returned to baseline and stable Postop Assessment: no signs of nausea or vomiting Anesthetic complications: no    Last Vitals:  Filed Vitals:   10/08/15 1008 10/08/15 1014  BP: 150/80 152/81  Pulse: 67 63  Temp: 36.5 C 36.5 C  Resp: 15 14    Last Pain: There were no vitals filed for this visit.               Zenaida Deed

## 2015-10-08 NOTE — Transfer of Care (Signed)
Immediate Anesthesia Transfer of Care Note  Patient: [name]  Procedure(s) Performed: Procedure(s): CYSTOSCOPY WITH DIRECT VISION INTERNAL URETHROTOMY (N/A)  Patient Location: PACU  Anesthesia Type:General  Level of Consciousness:  sedated, patient cooperative and responds to stimulation  Airway & Oxygen Therapy:Patient Spontanous Breathing and Patient connected to face mask oxgen  Post-op Assessment:  Report given to PACU RN and Post -op Vital signs reviewed and stable  Post vital signs:  Reviewed and stable  Last Vitals:  Filed Vitals:   10/08/15 0633  BP: 148/90  Pulse: 72  Temp: 36.9 C  Resp: 18    Complications: No apparent anesthesia complications

## 2015-10-08 NOTE — Op Note (Signed)
Date of procedure: 10/08/2015  Preoperative diagnosis:  1. Bulbar urethral stricture 2. History of prostate cancer   Postoperative diagnosis:  1. Bulbar urethral stricture 2. History of prostate cancer 3. Meatal stenosis   Procedure: 1. Direct visualization internal urethrotomy 2. Meatal dilation  Surgeon: Baruch Gouty, MD  Anesthesia: General  Complications: None  Intraoperative findings: The patient had a bulbar urethral stricture as well as meatal stenosis. The meatal stenosis was dilated with male urethral sounds and the bulbar urethral stricture was managed with direct visualization internal urethrotomy.  EBL: None  Specimens: None  Drains: 16 [place] tip Foley catheter  Disposition: Stable to the postanesthesia care unit  Indication for procedure: The patient is a 65 y.o. male with history of prostate cancer status post radical prostatectomy with salvage radiation who developed a bulbar urethral stricture. This was dilated in the office approximately one month ago however it quickly returned within 2 weeks. He presents today for DVIU.  After reviewing the management options for treatment, the patient elected to proceed with the above surgical procedure(s). We have discussed the potential benefits and risks of the procedure, side effects of the proposed treatment, the likelihood of the patient achieving the goals of the procedure, and any potential problems that might occur during the procedure or recuperation. Informed consent has been obtained.  Description of procedure: The patient was met in the preoperative area. All risks, benefits, and indications of the procedure were described in great detail. The patient consented to the procedure. Preoperative antibiotics were given. The patient was taken to the operative theater. General anesthesia was induced per the anesthesia service. The patient was then placed in the dorsal lithotomy position and prepped and draped in  the usual sterile fashion. A preoperative timeout was called.   Upon beginning the procedure, the patient was noted to have meatal stenosis. This was dilated with male urethral sounds to 20 [place]. Then the 21 [place] DVIU cystoscope was inserted in the patient's bladder per urethra. There is noted to be a bulbar urethral stricture as previously seen on office cystoscopy. A sensor wire was then placed in the bladder. The stricture was then cut at 12:00 with the DVIU set until it was patent. The cystoscope then easily advanced in the bladder. The cystoscope was withdrawn leaving the sensor wire. A 16 [place] catheter was then placed over the sensor wire and 10 cc was placed in the balloon. The sensor wire was removed. There is drainage clear yellow urine. The patient was awoken from anesthesia and transferred in stable condition to the postanesthesia care unit.  Plan: The patient will follow-up in one week for Foley catheter removal. We may consider at that time intermittent catheterization to keep his stricture patent.  Baruch Gouty, M.D.

## 2015-10-08 NOTE — Progress Notes (Signed)
Instructed on use and care of foley catheter with patient and his wife. Anne Fu at office for patient to pick up medication at desk at office now after d/c from [facility].

## 2015-10-08 NOTE — Discharge Instructions (Signed)
Drink plenty of fluids to keep urine clear. Resume diet as before surgery, light activity today then activity as before surgery. Call your doctor for fever 101 or greater, pain not relieved by medication, persistent nausea and vomiting,urine not draining.    Foley Catheter Care, Adult A Foley catheter is a soft, flexible tube. This tube is placed into your bladder to drain pee (urine). If you go home with this catheter in place, follow the instructions below. TAKING CARE OF THE CATHETER 1. Wash your hands with soap and water. 2. Put soap and water on a clean washcloth.  Clean the skin where the tube goes into your body.  Clean away from the tube site.  Never wipe toward the tube.  Clean the area using a circular motion.  Remove all the soap. Pat the area dry with a clean towel. For males, reposition the skin that covers the end of the penis (foreskin). 3. Attach the tube to your leg with tape or a leg strap. Do not stretch the tube tight. If you are using tape, remove any stickiness left behind by past tape you used. 4. Keep the drainage bag below your hips. Keep it off the floor. 5. Check your tube during the day. Make sure it is working and draining. Make sure the tube does not curl, twist, or bend. 6. Do not pull on the tube or try to take it out. TAKING CARE OF THE DRAINAGE BAGS You will have a large overnight drainage bag and a small leg bag. You may wear the overnight bag any time. Never wear the small bag at night. Follow the directions below. Emptying the Drainage Bag Empty your drainage bag when it is  - full or at least 2-3 times a day. 1. Wash your hands with soap and water. 2. Keep the drainage bag below your hips. 3. Hold the dirty bag over the toilet or clean container. 4. Open the pour spout at the bottom of the bag. Empty the pee into the toilet or container. Do not let the pour spout touch anything. 5. Clean the pour spout with a gauze pad or cotton ball that has  rubbing alcohol on it. 6. Close the pour spout. 7. Attach the bag to your leg with tape or a leg strap. 8. Wash your hands well. Changing the Drainage Bag Change your bag once a month or sooner if it starts to smell or look dirty.  1. Wash your hands with soap and water. 2. Pinch the rubber tube so that pee does not spill out. 3. Disconnect the catheter tube from the drainage tube at the connection valve. Do not let the tubes touch anything. 4. Clean the end of the catheter tube with an alcohol wipe. Clean the end of a the drainage tube with a different alcohol wipe. 5. Connect the catheter tube to the drainage tube of the clean drainage bag. 6. Attach the new bag to the leg with tape or a leg strap. Avoid attaching the new bag too tightly. 7. Wash your hands well. Cleaning the Drainage Bag 1. Wash your hands with soap and water. 2. Wash the bag in warm, soapy water. 3. Rinse the bag with warm water. 4. Fill the bag with a mixture of white vinegar and water (1 cup vinegar to 1 quart warm water [.2 liter vinegar to 1 liter warm water]). Close the bag and soak it for 30 minutes in the solution. 5. Rinse the bag with warm water. 6. Elbert Ewings  the bag to dry with the pour spout open and hanging downward. 7. Store the clean bag (once it is dry) in a clean plastic bag. 8. Wash your hands well. PREVENT INFECTION  Wash your hands before and after touching your tube.  Take showers every day. Wash the skin where the tube enters your body. Do not take baths. Replace wet leg straps with dry ones, if this applies.  Do not use powders, sprays, or lotions on the genital area. Only use creams, lotions, or ointments as told by your doctor.  For females, wipe from front to back after going to the bathroom.  Drink enough fluids to keep your pee clear or pale yellow unless you are told not to have too much fluid (fluid restriction).  Do not let the drainage bag or tubing touch or lie on the floor.  Wear  cotton underwear to keep the area dry. GET HELP IF:  Your pee is cloudy or smells unusually bad.  Your tube becomes clogged.  You are not draining pee into the bag or your bladder feels full.  Your tube starts to leak. GET HELP RIGHT AWAY IF:  You have pain, puffiness (swelling), redness, or yellowish-white fluid (pus) where the tube enters the body.  You have pain in the belly (abdomen), legs, lower back, or bladder.  You have a fever.  You see blood fill the tube, or your pee is pink or red.  You feel sick to your stomach (nauseous), throw up (vomit), or have chills.  Your tube gets pulled out. MAKE SURE YOU:   Understand these instructions.  Will watch your condition.  Will get help right away if you are not doing well or get worse.   This information is not intended to replace advice given to you by your health care provider. Make sure you discuss any questions you have with your health care provider.   Document Released: 08/22/2012 Document Revised: 05/18/2014 Document Reviewed: 08/22/2012 Elsevier Interactive Patient Education [employer].

## 2015-10-08 NOTE — Anesthesia Procedure Notes (Signed)
Procedure Name: LMA Insertion Date/Time: 10/08/2015 8:47 AM Performed by: West Pugh Pre-anesthesia Checklist: Patient identified, Emergency Drugs available, Suction available, Patient being monitored and Timeout performed Patient Re-evaluated:Patient Re-evaluated prior to inductionOxygen Delivery Method: Circle system utilized Preoxygenation: Pre-oxygenation with 100% oxygen Intubation Type: IV induction Ventilation: Mask ventilation without difficulty LMA: LMA inserted LMA Size: 5.0 Number of attempts: 1 Placement Confirmation: positive ETCO2,  CO2 detector and breath sounds checked- equal and bilateral Tube secured with: Tape Dental Injury: Teeth and Oropharynx as per pre-operative assessment

## 2015-10-08 NOTE — H&P (Signed)
Chief Complaint Prostate cancer   History of Present Illness       [name] returns today for a 2 mo f/u. He has completed radiation therapy from 6/1 through 11/26/14 for PSA recurrence prostate cancer. He still has some occasional bladder spasms. He did have urinary retention episode during radiation treatments. PVR is 0 ml. IPSS: 5 with nocturia x3. He reports dysuria since his radiation. He has tried urobel and urogesic blue which did not help his symptoms. He states that burning is his only complaint again, but it is slowly improving. He has baseline frequency that has had since his prostate surgery prior to his radiation. He has no other urinary complaints at this time. Urinalysis is negative.     He had a radical prostatectomy in February 2005 for stage T2c prostate cancer. His PSA was 0.21 in April 2015, 0.26 in April 2016 before salvage radiation. It decreased to 0.16 in September 2016. It was .13 in February 2017.     Interval history:  The patient underwent urethral dilation of urethral stricture in April 2017. This resolved his urinary symptoms. He was voiding very well. He had no more dysuria. He now states his stream sprays when he urinates. He is having discomfort when he urinates. The symptoms are similar to his prior to dilation. Repeat cystoscopy today showed meatal stenosis and recurrence of bulbar urethral stricture.   Past Medical History Problems  1. History of Acute Myocardial Infarction 2. History of hypertension (Z86.79) 3. Personal history of prostate cancer (Z85.46) 4. Personal history of prostate cancer (Z85.46) 5. Personal history of prostate cancer (Z85.46)  Surgical History Problems  1. History of Heart Surgery 2. History of Pacemaker Placement 3. History of Prostatect Retropubic Radical W/ Bilat Pelv Lymphadenectomy  Current Meds 1. Carvedilol 12.5 MG Oral Tablet;  Therapy: (Recorded:15Apr2013) to Recorded 2. Digoxin-Tab TABS;  Therapy:  (Recorded:20Aug2008) to Recorded 3. Janumet TABS;  Therapy: (Recorded:05Oct2010) to Recorded 4. Lipitor 20 MG Oral Tablet;  Therapy: (Recorded:20Aug2008) to Recorded 5. Ramipril 10 MG Oral Capsule;  Therapy: (Recorded:24Oct2011) to Recorded 6. Sotalol HCl - 120 MG Oral Tablet;  Therapy: 25Aug2012 to Recorded  Allergies Medication  1. No Known Drug Allergies  Family History Problems  1. Family history of Death In The Family Father 2. Family history of Family Health Status Number Of Children   1 son / 1 daug 3. No pertinent family history : Mother  Social History Problems  1. Denied: History of Alcohol Use (History) 2. Caffeine Use 3. Marital History - Currently Married 4. Never A Smoker 5. Denied: History of Tobacco Use  Vitals Vital Signs [Data Includes: Last 1 Day]  Recorded: [id number] 09:10AM  Blood Pressure: 146 / 86 Temperature: 98.3 F Heart Rate: 81  Physical Exam Constitutional: Well nourished . No acute distress.   ENT:. The ears and nose are normal in appearance.   Neck: The appearance of the neck is normal.   Pulmonary: No respiratory distress.   Cardiovascular:. No peripheral edema.   Abdomen: The abdomen is soft and nontender.   Skin: Normal skin turgor and no visible rash.   Neuro/Psych:. Mood and affect are appropriate.    Results/Data Urine [Data Includes: Last 1 Day]   [id number]  COLOR YELLOW   APPEARANCE CLEAR   SPECIFIC GRAVITY 1.025   pH 6.0   GLUCOSE NEGATIVE   BILIRUBIN NEGATIVE   KETONE NEGATIVE   BLOOD NEGATIVE   PROTEIN NEGATIVE   NITRITE NEGATIVE   LEUKOCYTE ESTERASE NEGATIVE  Procedure  Procedure: Cystoscopy  Chaperone Present: Marin Roberts.  Indication: Urethral Stricture.  Informed Consent: Risks, benefits, and potential adverse events were discussed and informed consent was obtained.  Prep: The patient was prepped with betadine.  Anesthesia:. Local anesthesia was administered intraurethrally with 2% lidocaine  jelly.  Antibiotic prophylaxis: Ciprofloxacin.  Procedure Note:  Urethral meatus:[name] Meatal stenosis - dilated.  Anterior urethra:. A stricture was present. (bulbar urethra - unable to pass scope through).  Bladder: The patient tolerated the procedure well.  Complications: None.    Assessment Assessed  1. Prostate cancer (C61) 2. Nocturia (R35.1) 3. Postprocedural stricture of anterior urethra [id number])  The patient has recurrence of his bulbar urethral stricture. He is alert and dilated once one month ago. This time he needs a formal DVIU in the operating room. He also had meatal stenosis which was corrected. No attempts were made to address the bulbar urethra stricture at this time the patient is emptying his bladder and he needs formal intervention.   Plan  Health Maintenance  1. UA With REFLEX; [Do Not Release]; Status:Complete;   [id number] 08:50AM  1. Prostate cancer status post prostatectomy with salvage radiation. PSA stable.  -Will check after below resolved    2. Urethral stricture   -cystoscopy, DVIU    3. Dysuria   -likely 2/2 above  URINE CULTURE; Status:In Progress - Specimen/Data Collected;  Done: [id number] Perform:[facility]; [id number]; Marked Important; Last Updated By:Kay, Debbie; 09/18/2015 9:49:13 AM;Ordered; Today;  [id number], Postprocedural stricture of anterior urethra, Prostate cancer; Ordered [id number], Aaron Edelman;   Electronics engineer signed by : Baruch Gouty, M.D.; Sep 18 2015  9:51AM EST

## 2015-11-03 ENCOUNTER — Other Ambulatory Visit: Payer: Self-pay | Admitting: Internal Medicine

## 2015-11-14 ENCOUNTER — Other Ambulatory Visit: Payer: Self-pay | Admitting: Internal Medicine

## 2015-12-10 ENCOUNTER — Ambulatory Visit (INDEPENDENT_AMBULATORY_CARE_PROVIDER_SITE_OTHER): Payer: Managed Care, Other (non HMO) | Admitting: *Deleted

## 2015-12-10 ENCOUNTER — Telehealth: Payer: Self-pay | Admitting: Internal Medicine

## 2015-12-10 DIAGNOSIS — I428 Other cardiomyopathies: Secondary | ICD-10-CM

## 2015-12-10 DIAGNOSIS — I429 Cardiomyopathy, unspecified: Secondary | ICD-10-CM

## 2015-12-10 DIAGNOSIS — Z95810 Presence of automatic (implantable) cardiac defibrillator: Secondary | ICD-10-CM

## 2015-12-10 LAB — CUP PACEART REMOTE DEVICE CHECK
Battery Voltage: 3.03 V
Date Time Interrogation Session: 20170801052306
HIGH POWER IMPEDANCE MEASURED VALUE: 61 Ohm
Implantable Lead Implant Date: 20140530
Lead Channel Pacing Threshold Amplitude: 0.5 V
Lead Channel Pacing Threshold Pulse Width: 0.4 ms
Lead Channel Sensing Intrinsic Amplitude: 7.75 mV
Lead Channel Sensing Intrinsic Amplitude: 7.75 mV
MDC IDC LEAD LOCATION: 753860
MDC IDC LEAD MODEL: 6935
MDC IDC MSMT BATTERY REMAINING LONGEVITY: 114 mo
MDC IDC MSMT LEADCHNL RV IMPEDANCE VALUE: 399 Ohm
MDC IDC MSMT LEADCHNL RV IMPEDANCE VALUE: 475 Ohm
MDC IDC SET LEADCHNL RV PACING AMPLITUDE: 2.5 V
MDC IDC SET LEADCHNL RV PACING PULSEWIDTH: 0.4 ms
MDC IDC SET LEADCHNL RV SENSING SENSITIVITY: 0.3 mV
MDC IDC STAT BRADY RV PERCENT PACED: 0.01 %

## 2015-12-10 NOTE — Telephone Encounter (Signed)
Spoke w/ pt and informed him that we did receive his remote transmission. Pt verbalized understanding.

## 2015-12-10 NOTE — Telephone Encounter (Signed)
Per pt call:   Pt is trying to conf that this office got the Transmissio  Please give him a call back.

## 2015-12-10 NOTE — Progress Notes (Signed)
Remote ICD transmission.   

## 2015-12-17 ENCOUNTER — Encounter: Payer: Self-pay | Admitting: Cardiology

## 2015-12-31 ENCOUNTER — Encounter: Payer: Self-pay | Admitting: Cardiology

## 2016-01-29 ENCOUNTER — Other Ambulatory Visit: Payer: Self-pay | Admitting: Internal Medicine

## 2016-01-29 ENCOUNTER — Encounter: Payer: Managed Care, Other (non HMO) | Admitting: Internal Medicine

## 2016-01-29 NOTE — Assessment & Plan Note (Signed)
Following with urology

## 2016-01-29 NOTE — Progress Notes (Signed)
Subjective:    Patient ID: [name], male    DOB: [birth year], 65 y.o.   MRN: [id number]  HPI    Medications and allergies reviewed with patient and updated if appropriate.  Patient Active Problem List   Diagnosis Date Noted  . GERD (gastroesophageal reflux disease) 07/29/2015  . Malignant neoplasm of prostate ([place]) 09/27/2014  . Hx of adenomatous colonic polyps 11/29/2012  . RLS (restless legs syndrome) 11/29/2012  . Obstructive sleep apnea 11/10/2012  . Right leg swelling 11/10/2012  . Elevated alkaline phosphatase level 10/04/2012  . Vitamin B 12 deficiency 03/06/2011  . Chronic systolic heart failure ([place]) 01/27/2011  . Mild anemia 01/20/2011  . Ventricular tachycardia ([place])   . NICM (nonischemic cardiomyopathy) ([place])   . Type 2 diabetes, uncontrolled, with peripheral circulatory disorder ([place]) 08/26/2009  . Hyperlipidemia 08/26/2009  . Essential hypertension 08/26/2009  . PROSTATE CANCER, HX OF 08/26/2009  . 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  . Personal history of adenomatous colonic polyps 09/05/2004  . Barrett's esophagus 09/05/2004    Current Outpatient Prescriptions on File Prior to Visit  Medication Sig Dispense Refill  . aspirin EC 81 MG tablet Take 81 mg by mouth daily.    [name] atorvastatin (LIPITOR) 20 MG tablet Take 1 tablet (20 mg total) by mouth daily. Take 1 tablet by mouth on Sun, Mon, Wed & Fri and take 1/2 tablet by mouth on Tues, Thurs & Sat (Patient taking differently: Take 10-20 mg by mouth See admin instructions. Take 1 tablet by mouth on Sun, Mon, Wed & Fri and take 1/2 tablet by mouth on Tues, Thurs & Sat) 90 tablet 1  . carvedilol (COREG) 12.5 MG tablet TAKE 1 AND 1/2 TABLETS BY MOUTH TWICE A DAY WITH MEALS (MAX ON INS) (Patient taking differently: Take 18.75 mg by mouth 2 (two) times daily with a meal. ) 90 tablet 5  . cephALEXin (KEFLEX) 500 MG capsule Take 1 capsule (500 mg total) by mouth 3 (three) times  daily. 6 capsule 0  . clonazePAM (KLONOPIN) 0.5 MG tablet Take 0.5 mg by mouth 3 (three) times daily as needed for anxiety.     . digoxin (LANOXIN) 0.125 MG tablet TAKE 1 TABLET BY MOUTH DAILY 30 tablet 5  . HYDROcodone-acetaminophen (NORCO) 5-325 MG tablet Take 1 tablet by mouth every 6 (six) hours as needed for moderate pain. 30 tablet 0  . ibuprofen (ADVIL,MOTRIN) 200 MG tablet Take 600 mg by mouth every 6 (six) hours as needed (For pain.).    [name] omeprazole (PRILOSEC) 20 MG capsule Take 1 capsule (20 mg total) by mouth daily. Recommended to take 30 min before breakfast. 90 capsule 1  . ONE TOUCH ULTRA TEST test strip USE TO CHECK BLOOD SUGAR TWICE A DAY DX 250.02 100 each 1  . ramipril (ALTACE) 10 MG capsule Take 1 capsule (10 mg total) by mouth 2 (two) times daily. 180 capsule 2  . sitaGLIPtin-metformin (JANUMET) 50-1000 MG tablet Take 1 tablet by mouth daily with supper. 90 tablet 3  . sotalol (BETAPACE) 120 MG tablet TAKE 1 TABLET BY MOUTH TWICE A DAY (MAX ON INS) 180 tablet 3  . vitamin B-12 (CYANOCOBALAMIN) 1000 MCG tablet Take 1,000 mcg by mouth daily.     No current facility-administered medications on file prior to visit.     Past Medical History:  Diagnosis Date  . AICD (automatic cardioverter/defibrillator) present   . Anxiety    since defibrillator placement  . Arthritis  knees  . Cataract   . Chronic systolic heart failure ([place])   . Colon polyps    tubular adenoma  . Diabetes mellitus   . Dysrhythmia   . GERD (gastroesophageal reflux disease)   . Hx of myocardial infarction   . Hyperlipidemia   . Hypertension   . NICM (nonischemic cardiomyopathy) ([place])    a.normal cors by cath in 2005. b. s/p Medtronic ICD.  [name] Pacemaker   . Prostate cancer ([place]) 06/2003   s/p prostatectomy  . S/P radiation therapy 10/10/2014 through 11/26/2014    Prostate bed 6600 cGy in 33 sessions   . Sleep apnea   .  Ventricular tachycardia ([place])    a. s/p ICD (generator change 09/2012). b. 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 and placed on sotalol    Past Surgical History:  Procedure Laterality Date  . CARDIAC DEFIBRILLATOR PLACEMENT  03/11/2004   Medtronic Maximo single lead  . COLONOSCOPY W/ BIOPSIES AND POLYPECTOMY  08/2004, 02/24/11   93mm adenoma in 2006,  5 mm polyp (not recovered) 2012  . CYSTOSCOPY WITH DIRECT VISION INTERNAL URETHROTOMY N/A 10/08/2015   Procedure: CYSTOSCOPY WITH DIRECT VISION INTERNAL URETHROTOMY;  Surgeon: Nickie Retort, MD;  Location: [facility];  Service: Urology;  Laterality: N/A;  . IMPLANTABLE CARDIOVERTER DEFIBRILLATOR GENERATOR CHANGE N/A 10/07/2012   Procedure: IMPLANTABLE CARDIOVERTER DEFIBRILLATOR GENERATOR CHANGE;  Surgeon: Evans Lance, MD;  Location: [facility] CATH LAB;  Service: Cardiovascular;  Laterality: N/A;  . PROSTATECTOMY  06/2003   Dr Janice Norrie  . RECTAL SURGERY    . UPPER GASTROINTESTINAL ENDOSCOPY  08/2004, 02/24/11   Barrett's esophagus    Social History   Social History  . Marital status: Married    Spouse name: N/A  . Number of children: 2  . 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 Smoking status: Never Smoker  . Smokeless tobacco: Never Used  . Alcohol use No  . Drug use: No  . Sexual activity: Not on file   Other Topics Concern  . Not on file   Social History Narrative  . No narrative on file    Family History  Problem Relation Age of Onset  . Coronary artery disease Father   . Prostate cancer Father   . Diabetes Father   . Hypertension Mother   . Diabetes Paternal Grandmother   . Stroke Neg Hx     Review of Systems     Objective:  There were no vitals filed for this visit. There were no vitals filed for this visit. There is no height or weight on file to calculate BMI.   Physical Exam        Assessment & Plan:    See Problem List for Assessment and Plan of chronic  medical problems.    This encounter was created in error - please disregard.

## 2016-02-04 ENCOUNTER — Other Ambulatory Visit: Payer: Self-pay | Admitting: Internal Medicine

## 2016-03-10 ENCOUNTER — Ambulatory Visit (INDEPENDENT_AMBULATORY_CARE_PROVIDER_SITE_OTHER): Payer: Managed Care, Other (non HMO) | Admitting: *Deleted

## 2016-03-10 DIAGNOSIS — I428 Other cardiomyopathies: Secondary | ICD-10-CM

## 2016-03-11 NOTE — Progress Notes (Signed)
Remote ICD transmission.   

## 2016-03-18 ENCOUNTER — Encounter: Payer: Self-pay | Admitting: Cardiology

## 2016-04-09 LAB — CUP PACEART REMOTE DEVICE CHECK
HighPow Impedance: 59 Ohm
Implantable Lead Implant Date: 20140530
Implantable Lead Location: 753860
Implantable Lead Model: 6935
Implantable Pulse Generator Implant Date: 20140530
Lead Channel Pacing Threshold Amplitude: 0.5 V
Lead Channel Pacing Threshold Pulse Width: 0.4 ms
Lead Channel Sensing Intrinsic Amplitude: 7 mV
Lead Channel Setting Sensing Sensitivity: 0.3 mV
MDC IDC MSMT BATTERY REMAINING LONGEVITY: 111 mo
MDC IDC MSMT BATTERY VOLTAGE: 3.02 V
MDC IDC MSMT LEADCHNL RV IMPEDANCE VALUE: 361 Ohm
MDC IDC MSMT LEADCHNL RV IMPEDANCE VALUE: 418 Ohm
MDC IDC MSMT LEADCHNL RV SENSING INTR AMPL: 7 mV
MDC IDC SESS DTM: 20171031073627
MDC IDC SET LEADCHNL RV PACING AMPLITUDE: 2.5 V
MDC IDC SET LEADCHNL RV PACING PULSEWIDTH: 0.4 ms
MDC IDC STAT BRADY RV PERCENT PACED: 0.01 %

## 2016-04-16 ENCOUNTER — Other Ambulatory Visit: Payer: Self-pay | Admitting: Internal Medicine

## 2016-04-20 ENCOUNTER — Other Ambulatory Visit: Payer: Self-pay | Admitting: Internal Medicine

## 2016-05-27 ENCOUNTER — Other Ambulatory Visit: Payer: Self-pay | Admitting: Internal Medicine

## 2016-05-31 ENCOUNTER — Other Ambulatory Visit: Payer: Self-pay | Admitting: Internal Medicine

## 2016-06-09 ENCOUNTER — Ambulatory Visit (INDEPENDENT_AMBULATORY_CARE_PROVIDER_SITE_OTHER): Payer: Managed Care, Other (non HMO) | Admitting: *Deleted

## 2016-06-09 DIAGNOSIS — I428 Other cardiomyopathies: Secondary | ICD-10-CM | POA: Diagnosis not present

## 2016-06-09 NOTE — Progress Notes (Signed)
Remote ICD transmission.   

## 2016-06-10 ENCOUNTER — Encounter: Payer: Self-pay | Admitting: Cardiology

## 2016-06-14 LAB — CUP PACEART REMOTE DEVICE CHECK
Battery Voltage: 3.02 V
Date Time Interrogation Session: 20180130093727
HighPow Impedance: 65 Ohm
Implantable Lead Implant Date: 20140530
Implantable Lead Location: 753860
Implantable Pulse Generator Implant Date: 20140530
Lead Channel Pacing Threshold Amplitude: 0.5 V
Lead Channel Pacing Threshold Pulse Width: 0.4 ms
Lead Channel Sensing Intrinsic Amplitude: 8.125 mV
Lead Channel Sensing Intrinsic Amplitude: 8.125 mV
Lead Channel Setting Pacing Amplitude: 2.5 V
MDC IDC MSMT BATTERY REMAINING LONGEVITY: 108 mo
MDC IDC MSMT LEADCHNL RV IMPEDANCE VALUE: 418 Ohm
MDC IDC MSMT LEADCHNL RV IMPEDANCE VALUE: 475 Ohm
MDC IDC SET LEADCHNL RV PACING PULSEWIDTH: 0.4 ms
MDC IDC SET LEADCHNL RV SENSING SENSITIVITY: 0.3 mV
MDC IDC STAT BRADY RV PERCENT PACED: 0.01 %

## 2016-06-16 ENCOUNTER — Other Ambulatory Visit (INDEPENDENT_AMBULATORY_CARE_PROVIDER_SITE_OTHER): Payer: Managed Care, Other (non HMO)

## 2016-06-16 ENCOUNTER — Ambulatory Visit (INDEPENDENT_AMBULATORY_CARE_PROVIDER_SITE_OTHER): Payer: Managed Care, Other (non HMO) | Admitting: Internal Medicine

## 2016-06-16 ENCOUNTER — Encounter: Payer: Self-pay | Admitting: Internal Medicine

## 2016-06-16 VITALS — BP 146/78 | HR 74 | Temp 98.0°F | Resp 16 | Wt 232.0 lb

## 2016-06-16 DIAGNOSIS — IMO0002 Reserved for concepts with insufficient information to code with codable children: Secondary | ICD-10-CM

## 2016-06-16 DIAGNOSIS — E1151 Type 2 diabetes mellitus with diabetic peripheral angiopathy without gangrene: Secondary | ICD-10-CM | POA: Diagnosis not present

## 2016-06-16 DIAGNOSIS — K219 Gastro-esophageal reflux disease without esophagitis: Secondary | ICD-10-CM | POA: Diagnosis not present

## 2016-06-16 DIAGNOSIS — E538 Deficiency of other specified B group vitamins: Secondary | ICD-10-CM

## 2016-06-16 DIAGNOSIS — I10 Essential (primary) hypertension: Secondary | ICD-10-CM | POA: Diagnosis not present

## 2016-06-16 DIAGNOSIS — E7800 Pure hypercholesterolemia, unspecified: Secondary | ICD-10-CM

## 2016-06-16 DIAGNOSIS — E1165 Type 2 diabetes mellitus with hyperglycemia: Secondary | ICD-10-CM

## 2016-06-16 DIAGNOSIS — D649 Anemia, unspecified: Secondary | ICD-10-CM

## 2016-06-16 DIAGNOSIS — Z23 Encounter for immunization: Secondary | ICD-10-CM | POA: Diagnosis not present

## 2016-06-16 LAB — COMPREHENSIVE METABOLIC PANEL
ALT: 17 U/L (ref 0–53)
AST: 15 U/L (ref 0–37)
Albumin: 3.9 g/dL (ref 3.5–5.2)
Alkaline Phosphatase: 134 U/L — ABNORMAL HIGH (ref 39–117)
BUN: 14 mg/dL (ref 6–23)
CALCIUM: 9.1 mg/dL (ref 8.4–10.5)
CHLORIDE: 108 meq/L (ref 96–112)
CO2: 29 meq/L (ref 19–32)
CREATININE: 1.04 mg/dL (ref 0.40–1.50)
GFR: 92.16 mL/min (ref >60.00)
Glucose, Bld: 133 mg/dL — ABNORMAL HIGH (ref 70–99)
POTASSIUM: 4.1 meq/L (ref 3.5–5.1)
Sodium: 141 mEq/L (ref 135–145)
Total Bilirubin: 0.4 mg/dL (ref 0.2–1.2)
Total Protein: 7.6 g/dL (ref 6.0–8.3)

## 2016-06-16 LAB — CBC WITH DIFFERENTIAL/PLATELET
BASOS PCT: 0.3 % (ref 0.0–3.0)
Basophils Absolute: 0 10*3/uL (ref 0.0–0.1)
EOS ABS: 0.1 10*3/uL (ref 0.0–0.7)
Eosinophils Relative: 2.3 % (ref 0.0–5.0)
HEMATOCRIT: 38 % — AB (ref 39.0–52.0)
Hemoglobin: 12 g/dL — ABNORMAL LOW (ref 13.0–17.0)
LYMPHS PCT: 21.6 % (ref 12.0–46.0)
Lymphs Abs: 1.3 10*3/uL (ref 0.7–4.0)
MCHC: 31.6 g/dL (ref 30.0–36.0)
MCV: 74.1 fl — ABNORMAL LOW (ref 78.0–100.0)
MONO ABS: 0.5 10*3/uL (ref 0.1–1.0)
Monocytes Relative: 9 % (ref 3.0–12.0)
NEUTROS ABS: 4.1 10*3/uL (ref 1.4–7.7)
Neutrophils Relative %: 66.8 % (ref 43.0–77.0)
PLATELETS: 212 10*3/uL (ref 150.0–400.0)
RBC: 5.13 Mil/uL (ref 4.22–5.81)
RDW: 16.6 % — AB (ref 11.5–15.5)
WBC: 6.1 10*3/uL (ref 4.0–10.5)

## 2016-06-16 LAB — VITAMIN B12: VITAMIN B 12: 1083 pg/mL — AB (ref 211–911)

## 2016-06-16 LAB — FERRITIN: FERRITIN: 100.7 ng/mL (ref 22.0–322.0)

## 2016-06-16 LAB — IRON: IRON: 47 ug/dL (ref 42–165)

## 2016-06-16 LAB — HEMOGLOBIN A1C: Hgb A1c MFr Bld: 6.5 % (ref 4.6–6.5)

## 2016-06-16 MED ORDER — RANITIDINE HCL 150 MG PO TABS: 150.0000 mg | ORAL_TABLET | Freq: Every day | ORAL | 3 refills | Status: DC

## 2016-06-16 MED ORDER — AMLODIPINE BESYLATE 5 MG PO TABS: 5.0000 mg | ORAL_TABLET | Freq: Every day | ORAL | 3 refills | Status: DC

## 2016-06-16 NOTE — Progress Notes (Signed)
Pre visit review using our clinic review tool, if applicable. No additional management support is needed unless otherwise documented below in the visit note. 

## 2016-06-16 NOTE — Assessment & Plan Note (Addendum)
BP elevated and has been on high side Start amlodipine 5 mg daily Continue other medications at current doses cmp

## 2016-06-16 NOTE — Assessment & Plan Note (Signed)
a1c today Increase activity Sugars well controlled at home Will titrate medication if needed

## 2016-06-16 NOTE — Progress Notes (Signed)
Subjective:    Patient ID: [name], male    DOB: February 05, 1951, 66 y.o.   MRN: [id number]  HPI The patient is here for follow up.  Hypertension: He is taking his medication daily. He is compliant with a low sodium diet.  He denies palpitations, edema, shortness of breath and regular headaches. He is exercising regularly.  He does not monitor his blood pressure at home.    Hyperlipidemia: He is taking his medication daily. He is compliant with a low fat/cholesterol diet. He is working and is very active, but does not exercise outside of work. He denies myalgias.   Diabetes: He is taking his medication daily as prescribed. He is compliant with a diabetic diet. He is active, but not exercising regularly. He monitors his sugars and they have been running 115-122 fasting. He checks his feet daily and denies foot lesions. He is up-to-date with an ophthalmology examination.   GERD:  He is taking his medication daily as prescribed.  He denies any GERD symptoms and feels his GERD is well controlled.   B12 def: He is taking his vitamin B12 daily.  Medications and allergies reviewed with patient and updated if appropriate.  Patient Active Problem List   Diagnosis Date Noted  . GERD (gastroesophageal reflux disease) 07/29/2015  . Malignant neoplasm of prostate ([place]) 09/27/2014  . RLS (restless legs syndrome) 11/29/2012  . Obstructive sleep apnea 11/10/2012  . Right leg swelling 11/10/2012  . Elevated alkaline phosphatase level 10/04/2012  . Vitamin B 12 deficiency 03/06/2011  . Chronic systolic heart failure ([place]) 01/27/2011  . Mild anemia 01/20/2011  . Ventricular tachycardia ([place])   . NICM (nonischemic cardiomyopathy) ([place])   . Type 2 diabetes, uncontrolled, with peripheral circulatory disorder ([place]) 08/26/2009  . Hyperlipidemia 08/26/2009  . Essential hypertension 08/26/2009  . PROSTATE CANCER, HX OF 08/26/2009  . 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    . Personal history of adenomatous colonic polyps 09/05/2004  . Barrett's esophagus 09/05/2004    Current Outpatient Prescriptions on File Prior to Visit  Medication Sig Dispense Refill  . aspirin EC 81 MG tablet Take 81 mg by mouth daily.    [name] atorvastatin (LIPITOR) 20 MG tablet Take 1 tablet (20 mg total) by mouth daily. Take 1 tablet by mouth on Sun, Mon, Wed & Fri and take 1/2 tablet by mouth on Tues, Thurs & Sat (Patient taking differently: Take 10-20 mg by mouth See admin instructions. Take 1 tablet by mouth on Sun, Mon, Wed & Fri and take 1/2 tablet by mouth on Tues, Thurs & Sat) 90 tablet 1  . atorvastatin (LIPITOR) 20 MG tablet TAKE 1 TABLET (20 MG TOTAL) BY MOUTH DAILY EXCEPT 1/2 PILL TUESDAY,THURS,& SAT 90 tablet 2  . carvedilol (COREG) 12.5 MG tablet TAKE 1 AND 1/2 TABLETS BY MOUTH TWICE A DAY WITH MEALS (MAX ON INS) 90 tablet 5  . clonazePAM (KLONOPIN) 0.5 MG tablet Take 0.5 mg by mouth 3 (three) times daily as needed for anxiety.     . digoxin (LANOXIN) 0.125 MG tablet Take 1 tablet (125 mcg total) by mouth daily. --- Must have office visit for further refills. 30 tablet 1  . glucose blood (ONE TOUCH ULTRA TEST) test strip USE TO CHECK BLOOD SUGAR TWICE A DAY DX 250.02--- Office visit needed for further refills 100 each 0  . ibuprofen (ADVIL,MOTRIN) 200 MG tablet Take 600 mg by mouth every 6 (six) hours as needed (For pain.).    [name]  omeprazole (PRILOSEC) 20 MG capsule TAKE 1 CAPSULE (20 MG TOTAL) BY MOUTH DAILY. RECOMMENDED TO TAKE 30 MIN BEFORE BREAKFAST. 90 capsule 1  . ramipril (ALTACE) 10 MG capsule Take 1 capsule (10 mg total) by mouth 2 (two) times daily. --- Office visit needed for further refills 180 capsule 0  . sitaGLIPtin-metformin (JANUMET) 50-1000 MG tablet Take 1 tablet by mouth daily with supper. 90 tablet 3  . sotalol (BETAPACE) 120 MG tablet TAKE 1 TABLET BY MOUTH TWICE A DAY (MAX ON INS) 180 tablet 3  . vitamin B-12 (CYANOCOBALAMIN) 1000 MCG tablet Take 1,000 mcg by  mouth daily.     No current facility-administered medications on file prior to visit.     Past Medical History:  Diagnosis Date  . AICD (automatic cardioverter/defibrillator) present   . Anxiety    since defibrillator placement  . Arthritis    knees  . Cataract   . Chronic systolic heart failure ([place])   . Colon polyps    tubular adenoma  . Diabetes mellitus   . Dysrhythmia   . GERD (gastroesophageal reflux disease)   . Hx of myocardial infarction   . Hyperlipidemia   . Hypertension   . NICM (nonischemic cardiomyopathy) ([place])    a.normal cors by cath in 2005. b. s/p Medtronic ICD.  [name] Pacemaker   . Prostate cancer ([place]) 06/2003   s/p prostatectomy  . S/P radiation therapy 10/10/2014 through 11/26/2014    Prostate bed 6600 cGy in 33 sessions   . Sleep apnea   . Ventricular tachycardia ([place])    a. s/p ICD (generator change 09/2012). b. 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 and placed on sotalol    Past Surgical History:  Procedure Laterality Date  . CARDIAC DEFIBRILLATOR PLACEMENT  03/11/2004   Medtronic Maximo single lead  . COLONOSCOPY W/ BIOPSIES AND POLYPECTOMY  08/2004, 02/24/11   26mm adenoma in 2006,  5 mm polyp (not recovered) 2012  . CYSTOSCOPY WITH DIRECT VISION INTERNAL URETHROTOMY N/A 10/08/2015   Procedure: CYSTOSCOPY WITH DIRECT VISION INTERNAL URETHROTOMY;  Surgeon: Nickie Retort, MD;  Location: [facility];  Service: Urology;  Laterality: N/A;  . IMPLANTABLE CARDIOVERTER DEFIBRILLATOR GENERATOR CHANGE N/A 10/07/2012   Procedure: IMPLANTABLE CARDIOVERTER DEFIBRILLATOR GENERATOR CHANGE;  Surgeon: Evans Lance, MD;  Location: [facility] CATH LAB;  Service: Cardiovascular;  Laterality: N/A;  . PROSTATECTOMY  06/2003   Dr Janice Norrie  . RECTAL SURGERY    . UPPER GASTROINTESTINAL ENDOSCOPY  08/2004, 02/24/11   Barrett's esophagus    Social History   Social History  . Marital status: Married    Spouse name: N/A  . Number of  children: 2  . 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 Smoking status: Never Smoker  . Smokeless tobacco: Never Used  . Alcohol use No  . Drug use: No  . Sexual activity: Not on file   Other Topics Concern  . Not on file   Social History Narrative  . No narrative on file    Family History  Problem Relation Age of Onset  . Coronary artery disease Father   . Prostate cancer Father   . Diabetes Father   . Hypertension Mother   . Diabetes Paternal Grandmother   . Stroke Neg Hx     Review of Systems  Constitutional: Negative for fever.  Respiratory: Negative for cough, shortness of breath and wheezing.   Cardiovascular: Positive for chest pain (intermittent -  has discussed Dr Lovena Le). Negative for leg swelling.  Gastrointestinal: Negative for abdominal pain.  Neurological: Negative for light-headedness and headaches.       Objective:   Vitals:   06/16/16 1125  BP: (!) 146/78  Pulse: 74  Resp: 16  Temp: 98 F (36.7 C)   Wt Readings from Last 3 Encounters:  06/16/16 232 lb (105.2 kg)  10/08/15 232 lb (105.2 kg)  10/04/15 232 lb (105.2 kg)   Body mass index is 32.36 kg/m.   Physical Exam    Constitutional: Appears well-developed and well-nourished. No distress.  HENT:  Head: Normocephalic and atraumatic.  Neck: Neck supple. No tracheal deviation present. No thyromegaly present.  No cervical lymphadenopathy Cardiovascular: Normal rate, regular rhythm and normal heart sounds.   No murmur heard. No carotid bruit .  No edema Pulmonary/Chest: Effort normal and breath sounds normal. No respiratory distress. No has no wheezes. No rales.  Skin: Skin is warm and dry. Not diaphoretic.  Psychiatric: Normal mood and affect. Behavior is normal.      Assessment & Plan:    See Problem List for Assessment and Plan of chronic medical problems.

## 2016-06-16 NOTE — Assessment & Plan Note (Signed)
Check B12 level. 

## 2016-06-16 NOTE — Assessment & Plan Note (Signed)
Check cbc, iron level

## 2016-06-16 NOTE — Assessment & Plan Note (Addendum)
Controlled Will try tapering off omeprazole and transition to zantac Stressed importance of GERD control

## 2016-06-16 NOTE — Patient Instructions (Addendum)
  Test(s) ordered today. Your results will be released to [place] (or called to you) after review, usually within 72hours after test completion. If any changes need to be made, you will be notified at that same time.  All other Health Maintenance issues reviewed.   All recommended immunizations and age-appropriate screenings are up-to-date or discussed.  Flu immunizatios administered today.   Medications reviewed and updated.  Changes include  Starting amlodipine for your BP. We will try tapering you off the prilosec - start taking zantac.  Your prescription(s) have been submitted to your pharmacy. Please take as directed and contact our office if you believe you are having problem(s) with the medication(s).    Please followup in 6 months

## 2016-06-16 NOTE — Assessment & Plan Note (Signed)
Check lipid panel  Continue daily statin Regular exercise and healthy diet encouraged  

## 2016-06-18 ENCOUNTER — Encounter: Payer: Self-pay | Admitting: Internal Medicine

## 2016-06-23 ENCOUNTER — Encounter: Payer: Self-pay | Admitting: Internal Medicine

## 2016-06-29 ENCOUNTER — Other Ambulatory Visit: Payer: Self-pay | Admitting: Internal Medicine

## 2016-06-30 ENCOUNTER — Other Ambulatory Visit: Payer: Self-pay | Admitting: Internal Medicine

## 2016-07-09 ENCOUNTER — Other Ambulatory Visit: Payer: Self-pay | Admitting: Internal Medicine

## 2016-07-26 ENCOUNTER — Other Ambulatory Visit: Payer: Self-pay | Admitting: Internal Medicine

## 2016-07-28 ENCOUNTER — Encounter: Payer: Self-pay | Admitting: Family

## 2016-07-28 ENCOUNTER — Ambulatory Visit (INDEPENDENT_AMBULATORY_CARE_PROVIDER_SITE_OTHER): Payer: Managed Care, Other (non HMO) | Admitting: Family

## 2016-07-28 DIAGNOSIS — B029 Zoster without complications: Secondary | ICD-10-CM

## 2016-07-28 HISTORY — DX: Zoster without complications: B02.9

## 2016-07-28 MED ORDER — VALACYCLOVIR HCL 1 G PO TABS: 1000.0000 mg | ORAL_TABLET | Freq: Three times a day (TID) | ORAL | 0 refills | Status: DC

## 2016-07-28 MED ORDER — GABAPENTIN 300 MG PO CAPS: 300.0000 mg | ORAL_CAPSULE | Freq: Every day | ORAL | 0 refills | Status: DC

## 2016-07-28 NOTE — Progress Notes (Signed)
Subjective:    Patient ID: [name], male    DOB: [birth year], 66 y.o.   MRN: [id number]  Chief Complaint  Patient presents with  . Anal Itching    has a burning and itching sensation on upper left part of back, states that the burning is bad, x1 week    HPI:  [name] is a 66 y.o. male who  has a past medical history of AICD (automatic cardioverter/defibrillator) present; Anxiety; Arthritis; Cataract; Chronic systolic heart failure ([place]); Colon polyps; Diabetes mellitus; Dysrhythmia; GERD (gastroesophageal reflux disease); myocardial infarction; Hyperlipidemia; Hypertension; NICM (nonischemic cardiomyopathy) ([place]); Pacemaker; Prostate cancer ([place]) (06/2003); S/P radiation therapy (10/10/2014 through 11/26/2014 ); Sleep apnea; and Ventricular tachycardia ([place]). and presents today for an acute office visit.  This is a new problem. Associated symptom of burning and itching located on the left upper part of his back has been going on for about 1 week. No rashes. Denies any modifying factors that make it better or worse or attempted treatment. Course of symptoms has stayed about the same. Timing of symptoms is generally worse at night.    Allergies  Allergen Reactions  . Zantac [Ranitidine Hcl]     itching    Outpatient Medications Prior to Visit  Medication Sig Dispense Refill  . amLODipine (NORVASC) 5 MG tablet Take 1 tablet (5 mg total) by mouth daily. 90 tablet 3  . aspirin EC 81 MG tablet Take 81 mg by mouth daily.    [name] atorvastatin (LIPITOR) 20 MG tablet TAKE 1 TABLET (20 MG TOTAL) BY MOUTH DAILY EXCEPT 1/2 PILL TUESDAY,THURS,& SAT 90 tablet 2  . carvedilol (COREG) 12.5 MG tablet TAKE 1 AND 1/2 TABLETS BY MOUTH TWICE A DAY WITH MEALS (MAX ON INS) 90 tablet 5  . clonazePAM (KLONOPIN) 0.5 MG tablet Take 0.5 mg by mouth 3 (three) times daily as needed for anxiety.     . digoxin (LANOXIN) 0.125 MG tablet Take 1 tablet (125 mcg  total) by mouth daily. 30 tablet 5  . glucose blood (ONE TOUCH ULTRA TEST) test strip USE TO CHECK BLOOD SUGAR TWICE DAILY -- DX 250.02 100 each 3  . ibuprofen (ADVIL,MOTRIN) 200 MG tablet Take 600 mg by mouth every 6 (six) hours as needed (For pain.).    [name] JANUMET 50-1000 MG tablet TAKE 1 TABLET BY MOUTH DAILY WITH SUPPER (MAX ON INS) 90 tablet 2  . ramipril (ALTACE) 10 MG capsule Take 1 capsule (10 mg total) by mouth 2 (two) times daily. --- Office visit needed for further refills 180 capsule 0  . sotalol (BETAPACE) 120 MG tablet TAKE 1 TABLET BY MOUTH TWICE A DAY (MAX ON INS) 180 tablet 3  . vitamin B-12 (CYANOCOBALAMIN) 1000 MCG tablet Take 1,000 mcg by mouth daily.     No facility-administered medications prior to visit.     Psychiatric he is a Review of Systems  Constitutional: Negative for chills and fever.  Skin: Negative for color change and rash.       Positive for itching  Neurological:       Positive for neuropathic pain      Objective:    BP 124/80 (BP Location: Left Arm, Patient Position: Sitting, Cuff Size: Large)   Pulse 87   Temp 97.9 F (36.6 C) (Oral)   Resp 16   Ht 5\' 11"  (1.803 m)   Wt 236 lb (107 kg)   SpO2 97%   BMI 32.92 kg/m  Nursing note and vital signs  reviewed.  Physical Exam  Constitutional: He is oriented to person, place, and time. He appears well-developed and well-nourished. No distress.  Cardiovascular: Normal rate, regular rhythm, normal heart sounds and intact distal pulses.   Pulmonary/Chest: Effort normal and breath sounds normal.  Neurological: He is alert and oriented to person, place, and time.  Skin: Skin is warm and dry.  Mid/upper back with no obvious rash, deformity, or discoloration. No palpable tenderness able to be elicited. Tender area appears to be in a dermatomal pattern.  Psychiatric: He has a normal mood and affect. His behavior is normal. Judgment and thought content normal.       Assessment & Plan:   Problem List  Items Addressed This Visit      Other   Shingles    Symptoms and exam consistent with shingles without rash given neuropathic pain and itching. Start valacyclovir. Start gabapentin as needed for neuropathic pain. Follow-up if symptoms worsen or do not improve.      Relevant Medications   valACYclovir (VALTREX) 1000 MG tablet   gabapentin (NEURONTIN) 300 MG capsule       I am having [name] start on valACYclovir and gabapentin. I am also having him maintain his sotalol, clonazePAM, vitamin B-12, aspirin EC, ibuprofen, atorvastatin, ramipril, amLODipine, JANUMET, glucose blood, digoxin, and carvedilol.   Meds ordered this encounter  Medications  . valACYclovir (VALTREX) 1000 MG tablet    Sig: Take 1 tablet (1,000 mg total) by mouth 3 (three) times daily.    Dispense:  21 tablet    Refill:  0    Order Specific Question:   Supervising Provider    Answer:   Pricilla Holm A [[id number]]  . gabapentin (NEURONTIN) 300 MG capsule    Sig: Take 1 capsule (300 mg total) by mouth at bedtime.    Dispense:  30 capsule    Refill:  0    Order Specific Question:   Supervising Provider    Answer:   Pricilla Holm A [[id number]]     Follow-up: Return if symptoms worsen or fail to improve.  Mauricio Po, FNP

## 2016-07-28 NOTE — Assessment & Plan Note (Signed)
Symptoms and exam consistent with shingles without rash given neuropathic pain and itching. Start valacyclovir. Start gabapentin as needed for neuropathic pain. Follow-up if symptoms worsen or do not improve.

## 2016-07-28 NOTE — Patient Instructions (Signed)
Thank you for choosing [facility].  SUMMARY AND INSTRUCTIONS:  Start Valtrex daily.   Start the gabapentin.   Medication:  Your prescription(s) have been submitted to your pharmacy or been printed and provided for you. Please take as directed and contact our office if you believe you are having problem(s) with the medication(s) or have any questions.  Follow up:  If your symptoms worsen or fail to improve, please contact our office for further instruction, or in case of emergency go directly to the emergency room at the closest medical facility.     Shingles Shingles is an infection that causes a painful skin rash and fluid-filled blisters. Shingles is caused by the same virus that causes chickenpox. Shingles only develops in people who:  Have had chickenpox.  Have gotten the chickenpox vaccine. (This is rare.) The first symptoms of shingles may be itching, tingling, or pain in an area on your skin. A rash will follow in a few days or weeks. The rash is usually on one side of the body in a bandlike or beltlike pattern. Over time, the rash turns into fluid-filled blisters that break open, scab over, and dry up. Medicines may:  Help you manage pain.  Help you recover more quickly.  Help to prevent long-term problems. Follow these instructions at home: Medicines   Take medicines only as told by your doctor.  Apply an anti-itch or numbing cream to the affected area as told by your doctor. Blister and Rash Care   Take a cool bath or put cool compresses on the area of the rash or blisters as told by your doctor. This may help with pain and itching.  Keep your rash covered with a loose bandage (dressing). Wear loose-fitting clothing.  Keep your rash and blisters clean with mild soap and cool water or as told by your doctor.  Check your rash every day for signs of infection. These include redness, swelling, and pain that lasts or gets worse.  Do not pick your  blisters.  Do not scratch your rash. General instructions   Rest as told by your doctor.  Keep all follow-up visits as told by your doctor. This is important.  Until your blisters scab over, your infection can cause chickenpox in people who have never had it or been vaccinated against it. To prevent this from happening, avoid touching other people or being around other people, especially:  Babies.  Pregnant women.  Children who have eczema.  Elderly people who have transplants.  People who have chronic illnesses, such as leukemia or AIDS. Contact a doctor if:  Your pain does not get better with medicine.  Your pain does not get better after the rash heals.  Your rash looks infected. Signs of infection include:  Redness.  Swelling.  Pain that lasts or gets worse. Get help right away if:  The rash is on your face or nose.  You have pain in your face, pain around your eye area, or loss of feeling on one side of your face.  You have ear pain or you have ringing in your ear.  You have loss of taste.  Your condition gets worse. This information is not intended to replace advice given to you by your health care provider. Make sure you discuss any questions you have with your health care provider. Document Released: 10/14/2007 Document Revised: 12/22/2015 Document Reviewed: 02/06/2014 Elsevier Interactive Patient Education  2017 [employer].

## 2016-08-06 ENCOUNTER — Telehealth: Payer: Self-pay | Admitting: Internal Medicine

## 2016-08-06 NOTE — Telephone Encounter (Signed)
valACYclovir (VALTREX) 1000 MG tablet   Patient is requesting a refill on this medication. He states he believes he still needs it. Please advise. Thank you.

## 2016-08-07 ENCOUNTER — Other Ambulatory Visit: Payer: Self-pay | Admitting: Family

## 2016-08-07 DIAGNOSIS — B029 Zoster without complications: Secondary | ICD-10-CM

## 2016-08-07 NOTE — Telephone Encounter (Signed)
A 7 day course of medication should be sufficient and an additional dosage is not usually indicated. He still may continue to have some of the burning for up to a few weeks or possibly longer.

## 2016-08-08 ENCOUNTER — Other Ambulatory Visit: Payer: Self-pay | Admitting: Internal Medicine

## 2016-08-10 NOTE — Telephone Encounter (Signed)
Called to inform patient, No answer LVM to call office back.

## 2016-08-10 NOTE — Telephone Encounter (Signed)
Patient called back.  Gave Greg's response.

## 2016-08-12 NOTE — Telephone Encounter (Signed)
He does not need to pick of refill - does not need to take another course

## 2016-08-12 NOTE — Telephone Encounter (Signed)
Pt called and said that he was told that he did not need to continue the medication but that a refill was sent over to the pharmacy so he was confused as to whether or not he needs to pick it up or not. Please advise. Thanks [employer]

## 2016-08-12 NOTE — Telephone Encounter (Signed)
Please advise 

## 2016-08-13 NOTE — Telephone Encounter (Signed)
Spoke with pt to inform, pt understood.

## 2016-09-03 ENCOUNTER — Other Ambulatory Visit: Payer: Self-pay | Admitting: Family

## 2016-09-03 DIAGNOSIS — B029 Zoster without complications: Secondary | ICD-10-CM

## 2016-09-07 ENCOUNTER — Other Ambulatory Visit: Payer: Self-pay | Admitting: Internal Medicine

## 2016-09-09 ENCOUNTER — Ambulatory Visit (INDEPENDENT_AMBULATORY_CARE_PROVIDER_SITE_OTHER): Payer: Managed Care, Other (non HMO) | Admitting: *Deleted

## 2016-09-09 DIAGNOSIS — I428 Other cardiomyopathies: Secondary | ICD-10-CM | POA: Diagnosis not present

## 2016-09-09 LAB — CUP PACEART REMOTE DEVICE CHECK
Battery Remaining Longevity: 105 mo
HIGH POWER IMPEDANCE MEASURED VALUE: 64 Ohm
Implantable Lead Implant Date: 20140530
Implantable Pulse Generator Implant Date: 20140530
Lead Channel Pacing Threshold Amplitude: 0.5 V
Lead Channel Pacing Threshold Pulse Width: 0.4 ms
Lead Channel Sensing Intrinsic Amplitude: 7.875 mV
Lead Channel Setting Pacing Pulse Width: 0.4 ms
Lead Channel Setting Sensing Sensitivity: 0.3 mV
MDC IDC LEAD LOCATION: 753860
MDC IDC MSMT BATTERY VOLTAGE: 3.02 V
MDC IDC MSMT LEADCHNL RV IMPEDANCE VALUE: 418 Ohm
MDC IDC MSMT LEADCHNL RV IMPEDANCE VALUE: 475 Ohm
MDC IDC MSMT LEADCHNL RV SENSING INTR AMPL: 7.875 mV
MDC IDC SESS DTM: 20180502092826
MDC IDC SET LEADCHNL RV PACING AMPLITUDE: 2.5 V
MDC IDC STAT BRADY RV PERCENT PACED: 0.01 %

## 2016-09-09 NOTE — Progress Notes (Signed)
Remote ICD transmission.   

## 2016-09-10 ENCOUNTER — Encounter: Payer: Self-pay | Admitting: Cardiology

## 2016-09-24 ENCOUNTER — Encounter: Payer: Self-pay | Admitting: Cardiology

## 2016-10-03 ENCOUNTER — Other Ambulatory Visit: Payer: Self-pay | Admitting: Family

## 2016-10-03 DIAGNOSIS — B029 Zoster without complications: Secondary | ICD-10-CM

## 2016-10-06 NOTE — Telephone Encounter (Signed)
Please advise if okay to fill, saw [name] for acute on 07/28/16

## 2016-10-19 ENCOUNTER — Other Ambulatory Visit: Payer: Self-pay | Admitting: Internal Medicine

## 2016-10-29 ENCOUNTER — Encounter: Payer: Self-pay | Admitting: Internal Medicine

## 2016-11-01 ENCOUNTER — Other Ambulatory Visit: Payer: Self-pay | Admitting: Internal Medicine

## 2016-11-02 NOTE — Telephone Encounter (Signed)
Not on current med list, please advise

## 2016-11-04 ENCOUNTER — Other Ambulatory Visit: Payer: Self-pay | Admitting: Internal Medicine

## 2016-11-04 DIAGNOSIS — B029 Zoster without complications: Secondary | ICD-10-CM

## 2016-11-17 ENCOUNTER — Ambulatory Visit (INDEPENDENT_AMBULATORY_CARE_PROVIDER_SITE_OTHER): Payer: Managed Care, Other (non HMO) | Admitting: Internal Medicine

## 2016-11-17 ENCOUNTER — Encounter: Payer: Self-pay | Admitting: Internal Medicine

## 2016-11-17 VITALS — BP 130/78 | HR 70 | Ht 71.0 in | Wt 229.6 lb

## 2016-11-17 DIAGNOSIS — I5022 Chronic systolic (congestive) heart failure: Secondary | ICD-10-CM

## 2016-11-17 DIAGNOSIS — I428 Other cardiomyopathies: Secondary | ICD-10-CM

## 2016-11-17 LAB — CUP PACEART INCLINIC DEVICE CHECK
Battery Voltage: 3.01 V
Date Time Interrogation Session: 20180710101735
HighPow Impedance: 62 Ohm
Implantable Lead Location: 753860
Implantable Pulse Generator Implant Date: 20140530
Lead Channel Pacing Threshold Pulse Width: 0.4 ms
Lead Channel Setting Pacing Amplitude: 2.5 V
MDC IDC LEAD IMPLANT DT: 20140530
MDC IDC MSMT BATTERY REMAINING LONGEVITY: 101 mo
MDC IDC MSMT LEADCHNL RV IMPEDANCE VALUE: 418 Ohm
MDC IDC MSMT LEADCHNL RV IMPEDANCE VALUE: 475 Ohm
MDC IDC MSMT LEADCHNL RV PACING THRESHOLD AMPLITUDE: 0.5 V
MDC IDC MSMT LEADCHNL RV SENSING INTR AMPL: 6.875 mV
MDC IDC MSMT LEADCHNL RV SENSING INTR AMPL: 7.25 mV
MDC IDC SET LEADCHNL RV PACING PULSEWIDTH: 0.4 ms
MDC IDC SET LEADCHNL RV SENSING SENSITIVITY: 0.3 mV
MDC IDC STAT BRADY RV PERCENT PACED: 0.01 %

## 2016-11-17 NOTE — Patient Instructions (Addendum)
Medication Instructions:  Your physician recommends that you continue on your current medications as directed. Please refer to the Current Medication list given to you today.   Labwork: None ordered.   Testing/Procedures: None ordered.   Follow-Up: Your physician wants you to follow-up in: one year with Dr. Lovena Le.   You will receive a reminder letter in the mail two months in advance. If you don't receive a letter, please call our office to schedule the follow-up appointment.  Remote monitoring is used to monitor your ICD from home. This monitoring reduces the number of office visits required to check your device to one time per year. It allows [place] to keep an eye on the functioning of your device to ensure it is working properly. You are scheduled for a device check from home on 12/09/2016. You may send your transmission at any time that day. If you have a wireless device, the transmission will be sent automatically. After your physician reviews your transmission, you will receive a postcard with your next transmission date.    Any Other Special Instructions Will Be Listed Below (If Applicable).     If you need a refill on your cardiac medications before your next appointment, please call your pharmacy.

## 2016-11-17 NOTE — Progress Notes (Signed)
HPI [name] returns today for followup. He is a 66 yo man with a long standing non-ischemic CM, s/p ICD implantation, with a h/o VT, HTN, and chronic class 2 systolic heart failure. In the interim, he continues to work full time. He has had no ICD shocks. He denies chest pain or sob or edema. No syncope. Allergies  Allergen Reactions  . Zantac [Ranitidine Hcl]     itching       Current Outpatient Prescriptions  Medication Sig Dispense Refill  . amLODipine (NORVASC) 5 MG tablet Take 1 tablet (5 mg total) by mouth daily. 90 tablet 3  . aspirin EC 81 MG tablet Take 81 mg by mouth daily.    [name] atorvastatin (LIPITOR) 20 MG tablet TAKE 1 TABLET (20 MG TOTAL) BY MOUTH DAILY EXCEPT 1/2 PILL TUESDAY,THURS,& SAT 90 tablet 0  . carvedilol (COREG) 12.5 MG tablet TAKE 1 AND 1/2 TABLETS BY MOUTH TWICE A DAY WITH MEALS (MAX ON INS) 90 tablet 5  . clonazePAM (KLONOPIN) 0.5 MG tablet Take 0.5 mg by mouth 3 (three) times daily as needed for anxiety.     . digoxin (LANOXIN) 0.125 MG tablet Take 1 tablet (125 mcg total) by mouth daily. 30 tablet 5  . glucose blood (ONE TOUCH ULTRA TEST) test strip USE TO CHECK BLOOD SUGAR TWICE DAILY -- DX 250.02 100 each 3  . ibuprofen (ADVIL,MOTRIN) 200 MG tablet Take 600 mg by mouth every 6 (six) hours as needed (For pain.).    [name] JANUMET 50-1000 MG tablet TAKE 1 TABLET BY MOUTH DAILY WITH SUPPER (MAX ON INS) 90 tablet 2  . omeprazole (PRILOSEC) 20 MG capsule TAKE 1 CAPSULE BY MOUTH DAILY. RECOMMENDED TO TAKE 30 MIN BEFORE BREAKFAST. *MAX PER INSURANCE 90 capsule 1  . ramipril (ALTACE) 10 MG capsule Take 1 capsule (10 mg total) by mouth 2 (two) times daily. 180 capsule 1  . sotalol (BETAPACE) 120 MG tablet TAKE 1 TABLET BY MOUTH TWICE A DAY (MAX ON INS) 180 tablet 3  . vitamin B-12 (CYANOCOBALAMIN) 1000 MCG tablet Take 1,000 mcg by mouth daily.     No current facility-administered medications for this visit.      Past Medical History:  Diagnosis Date  . AICD  (automatic cardioverter/defibrillator) present   . Anxiety    since defibrillator placement  . Arthritis    knees  . Cataract   . Chronic systolic heart failure ([place])   . Colon polyps    tubular adenoma  . Diabetes mellitus   . Dysrhythmia   . GERD (gastroesophageal reflux disease)   . Hx of myocardial infarction   . Hyperlipidemia   . Hypertension   . NICM (nonischemic cardiomyopathy) ([place])    a.normal cors by cath in 2005. b. s/p Medtronic ICD.  [name] Pacemaker   . Prostate cancer ([place]) 06/2003   s/p prostatectomy  . S/P radiation therapy 10/10/2014 through 11/26/2014    Prostate bed 6600 cGy in 33 sessions   . Sleep apnea   . Ventricular tachycardia ([place])    a. s/p ICD (generator change 09/2012). b. 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 and placed on sotalol    ROS:   All systems reviewed and negative except as noted in the HPI.   Past Surgical History:  Procedure Laterality Date  . CARDIAC DEFIBRILLATOR PLACEMENT  03/11/2004   Medtronic Maximo single lead  . COLONOSCOPY W/ BIOPSIES AND POLYPECTOMY  08/2004, 02/24/11   14mm adenoma in 2006,  5 mm  polyp (not recovered) 2012  . CYSTOSCOPY WITH DIRECT VISION INTERNAL URETHROTOMY N/A 10/08/2015   Procedure: CYSTOSCOPY WITH DIRECT VISION INTERNAL URETHROTOMY;  Surgeon: Nickie Retort, MD;  Location: [facility];  Service: Urology;  Laterality: N/A;  . IMPLANTABLE CARDIOVERTER DEFIBRILLATOR GENERATOR CHANGE N/A 10/07/2012   Procedure: IMPLANTABLE CARDIOVERTER DEFIBRILLATOR GENERATOR CHANGE;  Surgeon: Evans Lance, MD;  Location: [facility] C[facility] LAB;  Service: Cardiovascular;  Laterality: N/A;  . PROSTATECTOMY  06/2003   Dr Janice Norrie  . RECTAL SURGERY    . UPPER GASTROINTESTINAL ENDOSCOPY  08/2004, 02/24/11   Barrett's esophagus     Family History  Problem Relation Age of Onset  . Coronary artery disease Father   . Prostate cancer Father   . Diabetes Father   . Hypertension Mother   .  Diabetes Paternal Grandmother   . Stroke Neg Hx      Social History   Social History  . Marital status: Married    Spouse name: N/A  . Number of children: 2  . 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 Smoking status: Never Smoker  . Smokeless tobacco: Never Used  . Alcohol use No  . Drug use: No  . Sexual activity: Not on file   Other Topics Concern  . Not on file   Social History Narrative  . No narrative on file     BP 130/78   Pulse 70   Wt 229 lb 9.6 oz (104.1 kg)   SpO2 97%   BMI 32.02 kg/m   Physical Exam:  Well appearing 66 year old man, NAD HEENT: Unremarkable Neck:  7 cm JVD, no thyromegally Lymphatics:  No adenopathy Back:  No CVA tenderness Lungs:  Clear, with no wheezes, rales, or rhonchi. Well-healed ICD incision. HEART:  Regular rate rhythm, no murmurs, no rubs, no clicks Abd:  soft, positive bowel sounds, no organomegally, no rebound, no guarding Ext:  2 plus pulses, no edema, no cyanosis, no clubbing Skin:  No rashes no nodules Neuro:  CN II through XII intact, motor grossly intact   DEVICE  Normal device function.  See PaceArt for details.   Assess/Plan: 1. Chronic systolic heart failure - he is class 2A. He will continue his medical therapy. He is encouraged to maintain a low sodium diet. 2. VT - he has had no recurrent ventricular arrhythmias. No change in his meds. Continue sotalol. 3. ICD - his medtronic ICD is working normally with 8.3 years of battery longevity. 4. HTN - his blood pressure is reasonably well controlled. Will follow.  Mikle Bosworth.D.

## 2016-12-09 ENCOUNTER — Encounter: Payer: Managed Care, Other (non HMO) | Admitting: *Deleted

## 2016-12-09 ENCOUNTER — Telehealth: Payer: Self-pay | Admitting: Cardiology

## 2016-12-09 NOTE — Telephone Encounter (Signed)
LMOVM reminding pt to send remote transmission.   

## 2016-12-13 NOTE — Patient Instructions (Addendum)
  Test(s) ordered today. Your results will be released to [place] (or called to you) after review, usually within 72hours after test completion. If any changes need to be made, you will be notified at that same time.  All other Health Maintenance issues reviewed.   All recommended immunizations and age-appropriate screenings are up-to-date or discussed.  No immunizations administered today.   Medications reviewed and updated.  Changes include  Trying to transition from omeprazole to pepcid.    Your prescription(s) have been submitted to your pharmacy. Please take as directed and contact our office if you believe you are having problem(s) with the medication(s).  A referral was ordered for Dr Tamala Julian - sports medicine for further evaluation of your left hip and knee pain.   Please followup in 6 months

## 2016-12-13 NOTE — Progress Notes (Signed)
Subjective:    Patient ID: [name], male    DOB: 03/17/1951, 66 y.o.   MRN: [id number]  HPI The patient is here for follow up.  Left hip and knee pain:  He has pain in both joints.  He has had this for about one month, prior to that it was intermittent.  His leg feels will give out on him.  It is hard to get up and once he gets moving it is better.    Diabetes: He is taking his medication daily as prescribed. He is compliant with a diabetic diet. He is active at work, but not exercising regularly. He monitors his sugars and they have been running 120-130. He is up-to-date with an ophthalmology examination.   Hypertension: He is taking his medication daily. He is compliant with a low sodium diet.  He denies chest pain, palpitations, edema, shortness of breath and regular headaches. He is active at work, but not exercising regularly.     Hyperlipidemia: He is taking his medication daily. He is compliant with a low fat/cholesterol diet. He is active at work, but not exercising regularly. He denies myalgias.   GERD:  He is taking his medication daily as prescribed.  He denies any GERD symptoms and feels his GERD is well controlled.   Medications and allergies reviewed with patient and updated if appropriate.  Patient Active Problem List   Diagnosis Date Noted  . Shingles 07/28/2016  . GERD (gastroesophageal reflux disease) 07/29/2015  . Malignant neoplasm of prostate ([place]) 09/27/2014  . RLS (restless legs syndrome) 11/29/2012  . Obstructive sleep apnea 11/10/2012  . Right leg swelling 11/10/2012  . Elevated alkaline phosphatase level 10/04/2012  . Vitamin B 12 deficiency 03/06/2011  . Chronic systolic heart failure ([place]) 01/27/2011  . Mild anemia 01/20/2011  . Ventricular tachycardia ([place])   . NICM (nonischemic cardiomyopathy) ([place])   . Diabetes ([place]) 08/26/2009  . Hyperlipidemia 08/26/2009  . Essential hypertension 08/26/2009  . PROSTATE CANCER, HX OF 08/26/2009  .  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  . Personal history of adenomatous colonic polyps 09/05/2004  . Barrett's esophagus 09/05/2004    Current Outpatient Prescriptions on File Prior to Visit  Medication Sig Dispense Refill  . amLODipine (NORVASC) 5 MG tablet Take 1 tablet (5 mg total) by mouth daily. 90 tablet 3  . aspirin EC 81 MG tablet Take 81 mg by mouth daily.    [name] atorvastatin (LIPITOR) 20 MG tablet TAKE 1 TABLET (20 MG TOTAL) BY MOUTH DAILY EXCEPT 1/2 PILL TUESDAY,THURS,& SAT 90 tablet 0  . carvedilol (COREG) 12.5 MG tablet TAKE 1 AND 1/2 TABLETS BY MOUTH TWICE A DAY WITH MEALS (MAX ON INS) 90 tablet 5  . clonazePAM (KLONOPIN) 0.5 MG tablet Take 0.5 mg by mouth 3 (three) times daily as needed for anxiety.     . digoxin (LANOXIN) 0.125 MG tablet Take 1 tablet (125 mcg total) by mouth daily. 30 tablet 5  . glucose blood (ONE TOUCH ULTRA TEST) test strip USE TO CHECK BLOOD SUGAR TWICE DAILY -- DX 250.02 100 each 3  . ibuprofen (ADVIL,MOTRIN) 200 MG tablet Take 600 mg by mouth every 6 (six) hours as needed (For pain.).    [name] JANUMET 50-1000 MG tablet TAKE 1 TABLET BY MOUTH DAILY WITH SUPPER (MAX ON INS) 90 tablet 2  . omeprazole (PRILOSEC) 20 MG capsule TAKE 1 CAPSULE BY MOUTH DAILY. RECOMMENDED TO TAKE 30 MIN BEFORE BREAKFAST. *MAX PER INSURANCE 90 capsule 1  .  ramipril (ALTACE) 10 MG capsule Take 1 capsule (10 mg total) by mouth 2 (two) times daily. 180 capsule 1  . sotalol (BETAPACE) 120 MG tablet TAKE 1 TABLET BY MOUTH TWICE A DAY (MAX ON INS) 180 tablet 3  . vitamin B-12 (CYANOCOBALAMIN) 1000 MCG tablet Take 1,000 mcg by mouth daily.     No current facility-administered medications on file prior to visit.     Past Medical History:  Diagnosis Date  . AICD (automatic cardioverter/defibrillator) present   . Anxiety    since defibrillator placement  . Arthritis    knees  . Cataract   . Chronic systolic heart failure ([place])   . Colon polyps    tubular  adenoma  . Diabetes mellitus   . Dysrhythmia   . GERD (gastroesophageal reflux disease)   . Hx of myocardial infarction   . Hyperlipidemia   . Hypertension   . NICM (nonischemic cardiomyopathy) ([place])    a.normal cors by cath in 2005. b. s/p Medtronic ICD.  [name] Pacemaker   . Prostate cancer ([place]) 06/2003   s/p prostatectomy  . S/P radiation therapy 10/10/2014 through 11/26/2014    Prostate bed 6600 cGy in 33 sessions   . Sleep apnea   . Ventricular tachycardia ([place])    a. s/p ICD (generator change 09/2012). b. 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 and placed on sotalol    Past Surgical History:  Procedure Laterality Date  . CARDIAC DEFIBRILLATOR PLACEMENT  03/11/2004   Medtronic Maximo single lead  . COLONOSCOPY W/ BIOPSIES AND POLYPECTOMY  08/2004, 02/24/11   18mm adenoma in 2006,  5 mm polyp (not recovered) 2012  . CYSTOSCOPY WITH DIRECT VISION INTERNAL URETHROTOMY N/A 10/08/2015   Procedure: CYSTOSCOPY WITH DIRECT VISION INTERNAL URETHROTOMY;  Surgeon: Nickie Retort, MD;  Location: [facility];  Service: Urology;  Laterality: N/A;  . IMPLANTABLE CARDIOVERTER DEFIBRILLATOR GENERATOR CHANGE N/A 10/07/2012   Procedure: IMPLANTABLE CARDIOVERTER DEFIBRILLATOR GENERATOR CHANGE;  Surgeon: Evans Lance, MD;  Location: [facility] CATH LAB;  Service: Cardiovascular;  Laterality: N/A;  . PROSTATECTOMY  06/2003   Dr Janice Norrie  . RECTAL SURGERY    . UPPER GASTROINTESTINAL ENDOSCOPY  08/2004, 02/24/11   Barrett's esophagus    Social History   Social History  . Marital status: Married    Spouse name: N/A  . Number of children: 2  . 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 Smoking status: Never Smoker  . Smokeless tobacco: Never Used  . Alcohol use No  . Drug use: No  . Sexual activity: Not on file   Other Topics Concern  . Not on file   Social History Narrative  . No  narrative on file    Family History  Problem Relation Age of Onset  . Coronary artery disease Father   . Prostate cancer Father   . Diabetes Father   . Hypertension Mother   . Diabetes Paternal Grandmother   . Stroke Neg Hx     Review of Systems  Constitutional: Negative for chills and fever.  Respiratory: Negative for cough, shortness of breath and wheezing.   Cardiovascular: Negative for chest pain, palpitations and leg swelling.  Musculoskeletal: Negative for myalgias.  Neurological: Positive for numbness (in feet - chronic). Negative for light-headedness and headaches.       Objective:   Vitals:   12/14/16 0829  BP: 126/72  Pulse: 73  Resp: 16  Temp: 98 F (  36.7 C)   Wt Readings from Last 3 Encounters:  12/14/16 229 lb (103.9 kg)  11/17/16 229 lb 9.6 oz (104.1 kg)  07/28/16 236 lb (107 kg)   Body mass index is 31.94 kg/m.   Physical Exam    Constitutional: Appears well-developed and well-nourished. No distress.  HENT:  Head: Normocephalic and atraumatic.  Neck: Neck supple. No tracheal deviation present. No thyromegaly present.  No cervical lymphadenopathy Cardiovascular: Normal rate, regular rhythm and normal heart sounds.   No murmur heard. No carotid bruit .  No edema Pulmonary/Chest: Effort normal and breath sounds normal. No respiratory distress. No has no wheezes. No rales.  Skin: Skin is warm and dry. Not diaphoretic.  Psychiatric: Normal mood and affect. Behavior is normal.      Assessment & Plan:   prevnar today  See Problem List for Assessment and Plan of chronic medical problems.   Fu in 6 months

## 2016-12-14 ENCOUNTER — Ambulatory Visit (INDEPENDENT_AMBULATORY_CARE_PROVIDER_SITE_OTHER): Payer: Managed Care, Other (non HMO) | Admitting: Internal Medicine

## 2016-12-14 ENCOUNTER — Encounter: Payer: Self-pay | Admitting: Internal Medicine

## 2016-12-14 ENCOUNTER — Telehealth: Payer: Self-pay | Admitting: Emergency Medicine

## 2016-12-14 VITALS — BP 126/72 | HR 73 | Temp 98.0°F | Resp 16 | Ht 71.0 in | Wt 229.0 lb

## 2016-12-14 DIAGNOSIS — E7800 Pure hypercholesterolemia, unspecified: Secondary | ICD-10-CM | POA: Diagnosis not present

## 2016-12-14 DIAGNOSIS — Z23 Encounter for immunization: Secondary | ICD-10-CM

## 2016-12-14 DIAGNOSIS — I10 Essential (primary) hypertension: Secondary | ICD-10-CM

## 2016-12-14 DIAGNOSIS — K219 Gastro-esophageal reflux disease without esophagitis: Secondary | ICD-10-CM | POA: Diagnosis not present

## 2016-12-14 DIAGNOSIS — M25552 Pain in left hip: Secondary | ICD-10-CM

## 2016-12-14 DIAGNOSIS — M25562 Pain in left knee: Secondary | ICD-10-CM

## 2016-12-14 DIAGNOSIS — M25559 Pain in unspecified hip: Secondary | ICD-10-CM | POA: Insufficient documentation

## 2016-12-14 DIAGNOSIS — E1151 Type 2 diabetes mellitus with diabetic peripheral angiopathy without gangrene: Secondary | ICD-10-CM

## 2016-12-14 HISTORY — DX: Pain in unspecified hip: M25.559

## 2016-12-14 HISTORY — DX: Pain in left knee: M25.562

## 2016-12-14 MED ORDER — FAMOTIDINE 20 MG PO TABS: 20.0000 mg | ORAL_TABLET | Freq: Two times a day (BID) | ORAL | 5 refills | Status: DC

## 2016-12-14 NOTE — Assessment & Plan Note (Signed)
Check lipid panel  Continue daily statin Regular exercise and healthy diet encouraged  

## 2016-12-14 NOTE — Assessment & Plan Note (Signed)
Sugars well controlled at home Check a1c Low sugar / carb diet Stressed regular exercise 

## 2016-12-14 NOTE — Assessment & Plan Note (Signed)
BP well controlled Current regimen effective and well tolerated Continue current medications at current doses cmp  

## 2016-12-14 NOTE — Assessment & Plan Note (Signed)
Will refer to Dr Tamala Julian for further evaluation

## 2016-12-14 NOTE — Telephone Encounter (Signed)
-----   Message from Binnie Rail, MD sent at 12/14/2016  9:48 AM EDT ----- He left when the power went out.  He needs a 6 month f/u with me and an appt with Dr Tamala Julian for left hip and knee pain

## 2016-12-14 NOTE — Telephone Encounter (Signed)
Can you Follow-up with pt and schedule appts necessary please.

## 2016-12-14 NOTE — Assessment & Plan Note (Signed)
GERD controlled Continue daily medication, but will try transitioning to pepcid

## 2016-12-15 NOTE — Telephone Encounter (Signed)
Appointment scheduled with Dr Burn and Dr Tamala Julian.

## 2016-12-17 ENCOUNTER — Ambulatory Visit (INDEPENDENT_AMBULATORY_CARE_PROVIDER_SITE_OTHER): Payer: Managed Care, Other (non HMO) | Admitting: *Deleted

## 2016-12-17 DIAGNOSIS — I428 Other cardiomyopathies: Secondary | ICD-10-CM

## 2016-12-17 NOTE — Progress Notes (Signed)
Remote ICD transmission.   

## 2016-12-25 ENCOUNTER — Other Ambulatory Visit: Payer: Self-pay | Admitting: Internal Medicine

## 2016-12-27 ENCOUNTER — Other Ambulatory Visit: Payer: Self-pay | Admitting: Internal Medicine

## 2017-01-01 ENCOUNTER — Encounter: Payer: Self-pay | Admitting: Cardiology

## 2017-01-05 LAB — CUP PACEART REMOTE DEVICE CHECK
Battery Remaining Longevity: 101 mo
HighPow Impedance: 61 Ohm
Implantable Lead Implant Date: 20140530
Implantable Lead Location: 753860
Lead Channel Pacing Threshold Amplitude: 0.5 V
Lead Channel Sensing Intrinsic Amplitude: 7 mV
Lead Channel Setting Pacing Amplitude: 2.5 V
Lead Channel Setting Pacing Pulse Width: 0.4 ms
Lead Channel Setting Sensing Sensitivity: 0.3 mV
MDC IDC MSMT BATTERY VOLTAGE: 3 V
MDC IDC MSMT LEADCHNL RV IMPEDANCE VALUE: 399 Ohm
MDC IDC MSMT LEADCHNL RV IMPEDANCE VALUE: 456 Ohm
MDC IDC MSMT LEADCHNL RV PACING THRESHOLD PULSEWIDTH: 0.4 ms
MDC IDC MSMT LEADCHNL RV SENSING INTR AMPL: 7 mV
MDC IDC PG IMPLANT DT: 20140530
MDC IDC SESS DTM: 20180809174904
MDC IDC STAT BRADY RV PERCENT PACED: 0.01 %

## 2017-01-08 ENCOUNTER — Other Ambulatory Visit: Payer: Self-pay | Admitting: Emergency Medicine

## 2017-01-08 MED ORDER — ONETOUCH DELICA LANCETS 33G MISC: 3 refills | Status: DC

## 2017-01-16 ENCOUNTER — Other Ambulatory Visit: Payer: Self-pay | Admitting: Internal Medicine

## 2017-01-16 MED ORDER — ONETOUCH ULTRASOFT LANCETS MISC: 12 refills | Status: DC

## 2017-01-20 ENCOUNTER — Other Ambulatory Visit: Payer: Self-pay | Admitting: Internal Medicine

## 2017-01-26 ENCOUNTER — Other Ambulatory Visit: Payer: Self-pay | Admitting: Internal Medicine

## 2017-02-20 ENCOUNTER — Other Ambulatory Visit: Payer: Self-pay | Admitting: Internal Medicine

## 2017-03-11 ENCOUNTER — Other Ambulatory Visit: Payer: Self-pay | Admitting: Internal Medicine

## 2017-03-18 ENCOUNTER — Ambulatory Visit (INDEPENDENT_AMBULATORY_CARE_PROVIDER_SITE_OTHER): Payer: Medicare Other | Admitting: *Deleted

## 2017-03-18 DIAGNOSIS — I428 Other cardiomyopathies: Secondary | ICD-10-CM | POA: Diagnosis not present

## 2017-03-18 NOTE — Progress Notes (Signed)
Remote ICD transmission.   

## 2017-03-19 ENCOUNTER — Encounter: Payer: Self-pay | Admitting: Cardiology

## 2017-03-24 ENCOUNTER — Other Ambulatory Visit (INDEPENDENT_AMBULATORY_CARE_PROVIDER_SITE_OTHER): Payer: Medicare Other

## 2017-03-24 DIAGNOSIS — E1151 Type 2 diabetes mellitus with diabetic peripheral angiopathy without gangrene: Secondary | ICD-10-CM

## 2017-03-24 DIAGNOSIS — I10 Essential (primary) hypertension: Secondary | ICD-10-CM | POA: Diagnosis not present

## 2017-03-24 DIAGNOSIS — E7800 Pure hypercholesterolemia, unspecified: Secondary | ICD-10-CM | POA: Diagnosis not present

## 2017-03-24 LAB — CUP PACEART REMOTE DEVICE CHECK
Battery Remaining Longevity: 96 mo
Battery Voltage: 3.01 V
Brady Statistic RV Percent Paced: 0.01 %
Date Time Interrogation Session: 20181108113425
HIGH POWER IMPEDANCE MEASURED VALUE: 64 Ohm
Implantable Pulse Generator Implant Date: 20140530
Lead Channel Impedance Value: 399 Ohm
Lead Channel Impedance Value: 475 Ohm
Lead Channel Pacing Threshold Pulse Width: 0.4 ms
Lead Channel Sensing Intrinsic Amplitude: 7.125 mV
Lead Channel Setting Pacing Amplitude: 2.5 V
MDC IDC LEAD IMPLANT DT: 20140530
MDC IDC LEAD LOCATION: 753860
MDC IDC MSMT LEADCHNL RV PACING THRESHOLD AMPLITUDE: 0.5 V
MDC IDC MSMT LEADCHNL RV SENSING INTR AMPL: 7.125 mV
MDC IDC SET LEADCHNL RV PACING PULSEWIDTH: 0.4 ms
MDC IDC SET LEADCHNL RV SENSING SENSITIVITY: 0.3 mV

## 2017-03-24 LAB — COMPREHENSIVE METABOLIC PANEL
ALT: 18 U/L (ref 0–53)
AST: 15 U/L (ref 0–37)
Albumin: 3.8 g/dL (ref 3.5–5.2)
Alkaline Phosphatase: 133 U/L — ABNORMAL HIGH (ref 39–117)
BUN: 15 mg/dL (ref 6–23)
CO2: 30 meq/L (ref 19–32)
Calcium: 9.2 mg/dL (ref 8.4–10.5)
Chloride: 107 mEq/L (ref 96–112)
Creatinine, Ser: 1.06 mg/dL (ref 0.40–1.50)
GFR: 89.94 mL/min (ref >60.00)
GLUCOSE: 95 mg/dL (ref 70–99)
Potassium: 3.9 mEq/L (ref 3.5–5.1)
SODIUM: 144 meq/L (ref 135–145)
Total Bilirubin: 0.4 mg/dL (ref 0.2–1.2)
Total Protein: 7.2 g/dL (ref 6.0–8.3)

## 2017-03-24 LAB — LIPID PANEL
CHOL/HDL RATIO: 3
Cholesterol: 94 mg/dL (ref 0–200)
HDL: 33.3 mg/dL — AB (ref >39.00)
LDL Cholesterol: 43 mg/dL (ref 0–99)
NONHDL: 61.05
Triglycerides: 90 mg/dL (ref 0.0–149.0)
VLDL: 18 mg/dL (ref 0.0–40.0)

## 2017-03-24 LAB — HEMOGLOBIN A1C: Hgb A1c MFr Bld: 6.6 % — ABNORMAL HIGH (ref 4.6–6.5)

## 2017-03-25 ENCOUNTER — Ambulatory Visit: Payer: Managed Care, Other (non HMO) | Admitting: Family Medicine

## 2017-03-25 ENCOUNTER — Encounter: Payer: Self-pay | Admitting: Internal Medicine

## 2017-03-25 NOTE — Progress Notes (Deleted)
[name] [address] [place], [place] [address] Phone: [phone] Subjective:    I'm seeing this patient by the request  of:    CC: Left knee and hip pain  [id number]  [name] is a 66 y.o. male coming in with complaint of left hip and knee pain.  Onset-  Location Duration-  Character- Aggravating factors- Reliving factors-  Therapies tried-  Severity-      Past Medical History:  Diagnosis Date  . AICD (automatic cardioverter/defibrillator) present   . Anxiety    since defibrillator placement  . Arthritis    knees  . Cataract   . Chronic systolic heart failure ([place])   . Colon polyps    tubular adenoma  . Diabetes mellitus   . Dysrhythmia   . GERD (gastroesophageal reflux disease)   . Hx of myocardial infarction   . Hyperlipidemia   . Hypertension   . NICM (nonischemic cardiomyopathy) ([place])    a.normal cors by cath in 2005. b. s/p Medtronic ICD.  [name] Pacemaker   . Prostate cancer ([place]) 06/2003   s/p prostatectomy  . S/P radiation therapy 10/10/2014 through 11/26/2014    Prostate bed 6600 cGy in 33 sessions   . Sleep apnea   . Ventricular tachycardia ([place])    a. s/p ICD (generator change 09/2012). b. 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 and placed on sotalol   Past Surgical History:  Procedure Laterality Date  . CARDIAC DEFIBRILLATOR PLACEMENT  03/11/2004   Medtronic Maximo single lead  . COLONOSCOPY W/ BIOPSIES AND POLYPECTOMY  08/2004, 02/24/11   23mm adenoma in 2006,  5 mm polyp (not recovered) 2012  . CYSTOSCOPY WITH DIRECT VISION INTERNAL URETHROTOMY N/A 10/08/2015   Procedure: CYSTOSCOPY WITH DIRECT VISION INTERNAL URETHROTOMY;  Surgeon: Nickie Retort, MD;  Location: [facility];  Service: Urology;  Laterality: N/A;  . IMPLANTABLE CARDIOVERTER DEFIBRILLATOR GENERATOR CHANGE N/A 10/07/2012   Procedure: IMPLANTABLE CARDIOVERTER DEFIBRILLATOR GENERATOR CHANGE;   Surgeon: Evans Lance, MD;  Location: [facility] CATH LAB;  Service: Cardiovascular;  Laterality: N/A;  . PROSTATECTOMY  06/2003   Dr Janice Norrie  . RECTAL SURGERY    . UPPER GASTROINTESTINAL ENDOSCOPY  08/2004, 02/24/11   Barrett's esophagus   Social History   Socioeconomic History  . Marital status: Married    Spouse name: Not on file  . Number of children: 2  . Years of education: Not on file  . Highest education level: Not on file  Social Needs  . Financial resource strain: Not on file  . Food insecurity - worry: Not on file  . Food insecurity - inability: Not on file  . Transportation needs - medical: Not on file  . Transportation needs - non-medical: Not on file  Occupational History  . 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 Smoking status: Never Smoker  . Smokeless tobacco: Never Used  Substance and Sexual Activity  . Alcohol use: No  . Drug use: No  . Sexual activity: Not on file  Other Topics Concern  . Not on file  Social History Narrative  . Not on file   Allergies  Allergen Reactions  . Zantac [Ranitidine Hcl]     itching   Family History  Problem Relation Age of Onset  . Coronary artery disease Father   . Prostate cancer Father   . Diabetes Father   . Hypertension Mother   . Diabetes Paternal Grandmother   . Stroke Neg  Hx      Past medical history, social, surgical and family history all reviewed in electronic medical record.  No pertanent information unless stated regarding to the chief complaint.   Review of Systems:Review of systems updated and as accurate as of 03/25/17  No headache, visual changes, nausea, vomiting, diarrhea, constipation, dizziness, abdominal pain, skin rash, fevers, chills, night sweats, weight loss, swollen lymph nodes, body aches, joint swelling, muscle aches, chest pain, shortness of breath, mood changes.   Objective  There were no vitals taken for this visit. Systems examined below as of 03/25/17     General: No apparent distress alert and oriented x3 mood and affect normal, dressed appropriately.  HEENT: Pupils equal, extraocular movements intact  Respiratory: Patient's speak in full sentences and does not appear short of breath  Cardiovascular: No lower extremity edema, non tender, no erythema  Skin: Warm dry intact with no signs of infection or rash on extremities or on axial skeleton.  Abdomen: Soft nontender  Neuro: Cranial nerves II through XII are intact, neurovascularly intact in all extremities with 2+ DTRs and 2+ pulses.  Lymph: No lymphadenopathy of posterior or anterior cervical chain or axillae bilaterally.  Gait normal with good balance and coordination.  MSK:  Non tender with full range of motion and good stability and symmetric strength and tone of shoulders, elbows, wrist, hip, knee and ankles bilaterally.     Impression and Recommendations:     This case required medical decision making of moderate complexity.      Note: This dictation was prepared with Dragon dictation along with smaller phrase technology. Any transcriptional errors that result from this process are unintentional.

## 2017-04-03 ENCOUNTER — Other Ambulatory Visit: Payer: Self-pay | Admitting: Internal Medicine

## 2017-04-12 ENCOUNTER — Other Ambulatory Visit: Payer: Self-pay | Admitting: Internal Medicine

## 2017-04-27 ENCOUNTER — Other Ambulatory Visit: Payer: Self-pay | Admitting: Internal Medicine

## 2017-05-12 ENCOUNTER — Other Ambulatory Visit: Payer: Self-pay | Admitting: Internal Medicine

## 2017-06-08 ENCOUNTER — Other Ambulatory Visit: Payer: Self-pay | Admitting: Internal Medicine

## 2017-06-10 ENCOUNTER — Other Ambulatory Visit: Payer: Self-pay | Admitting: Internal Medicine

## 2017-06-16 NOTE — Progress Notes (Signed)
Subjective:    Patient ID: [name], male    DOB: 1950-12-13, 67 y.o.   MRN: [id number]  HPI The patient is here for follow up.  Diabetes: He is taking his medication daily as prescribed. He is fsirly compliant with a diabetic diet. He is very active, but not exercising regularly. He monitors his sugars and they have been running 120-.  He is up-to-date with an ophthalmology examination.   Hypertension: He is taking his medication daily. He is compliant with a low sodium diet.  He denies chest pain, edema, shortness of breath and regular headaches. He is very active, but not exercising regularly.     Hyperlipidemia: He is taking his medication daily. He is compliant with a low fat/cholesterol diet. He is very active, but not exercising regularly. He denies myalgias.   GERD:  He is taking his medication daily as prescribed.  He denies any GERD symptoms and feels his GERD is well controlled.    Medications and allergies reviewed with patient and updated if appropriate.  Patient Active Problem List   Diagnosis Date Noted  . Left hip pain 12/14/2016  . Left knee pain 12/14/2016  . Shingles 07/28/2016  . GERD (gastroesophageal reflux disease) 07/29/2015  . Malignant neoplasm of prostate ([place]) 09/27/2014  . RLS (restless legs syndrome) 11/29/2012  . Obstructive sleep apnea 11/10/2012  . Right leg swelling 11/10/2012  . Elevated alkaline phosphatase level 10/04/2012  . Vitamin B 12 deficiency 03/06/2011  . Chronic systolic heart failure ([place]) 01/27/2011  . Mild anemia 01/20/2011  . Ventricular tachycardia ([place])   . NICM (nonischemic cardiomyopathy) ([place])   . Diabetes ([place]) 08/26/2009  . Hyperlipidemia 08/26/2009  . Essential hypertension 08/26/2009  . PROSTATE CANCER, HX OF 08/26/2009  . 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  . Personal history of adenomatous colonic polyps 09/05/2004  . Barrett's esophagus 09/05/2004    Current Outpatient  Medications on File Prior to Visit  Medication Sig Dispense Refill  . amLODipine (NORVASC) 5 MG tablet TAKE 1 TABLET (5 MG TOTAL) BY MOUTH DAILY. 90 tablet 0  . aspirin EC 81 MG tablet Take 81 mg by mouth daily.    [name] atorvastatin (LIPITOR) 20 MG tablet TAKE 1 TABLET BY MOUTH DAILY EXCEPT 1/2 TABLET ON TUESDAY THURSDAY AND SATURDAY (MAX ON INS) 90 tablet 1  . carvedilol (COREG) 12.5 MG tablet TAKE 1 AND 1/2 TABLETS BY MOUTH TWICE A DAY WITH MEALS (MAX ON INS) 90 tablet 5  . clonazePAM (KLONOPIN) 0.5 MG tablet Take 0.5 mg by mouth 3 (three) times daily as needed for anxiety.     . digoxin (LANOXIN) 0.125 MG tablet TAKE 1 TABLET BY MOUTH EVERY DAY 30 tablet 5  . famotidine (PEPCID) 20 MG tablet Take 1 tablet (20 mg total) by mouth 2 (two) times daily. 60 tablet 5  . glucose blood (ONE TOUCH ULTRA TEST) test strip USE TO CHECK BLOOD SUGAR TWICE DAILY 100 each 3  . ibuprofen (ADVIL,MOTRIN) 200 MG tablet Take 600 mg by mouth every 6 (six) hours as needed (For pain.).    [name] JANUMET 50-1000 MG tablet TAKE 1 TABLET BY MOUTH DAILY WITH SUPPER (MAX ON INS) 90 tablet 1  . Lancets (ONETOUCH ULTRASOFT) lancets Use as instructed to test sugar daily and prn. Dx E11.9 100 each 12  . omeprazole (PRILOSEC) 20 MG capsule TAKE 1 CAPSULE BY MOUTH DAILY. RECOMMENDED TO TAKE 30 MIN BEFORE BREAKFAST. *MAX PER INSURANCE 90 capsule 1  . ramipril (  ALTACE) 10 MG capsule TAKE 1 CAPSULE (10 MG TOTAL) BY MOUTH 2 (TWO) TIMES DAILY. 180 capsule 1  . sotalol (BETAPACE) 120 MG tablet TAKE 1 TABLET BY MOUTH TWICE A DAY 180 tablet 0  . vitamin B-12 (CYANOCOBALAMIN) 1000 MCG tablet Take 1,000 mcg by mouth daily.     No current facility-administered medications on file prior to visit.     Past Medical History:  Diagnosis Date  . AICD (automatic cardioverter/defibrillator) present   . Anxiety    since defibrillator placement  . Arthritis    knees  . Cataract   . Chronic systolic heart failure ([place])   . Colon polyps    tubular  adenoma  . Diabetes mellitus   . Dysrhythmia   . GERD (gastroesophageal reflux disease)   . Hx of myocardial infarction   . Hyperlipidemia   . Hypertension   . NICM (nonischemic cardiomyopathy) ([place])    a.normal cors by cath in 2005. b. s/p Medtronic ICD.  [name] Pacemaker   . Prostate cancer ([place]) 06/2003   s/p prostatectomy  . S/P radiation therapy 10/10/2014 through 11/26/2014    Prostate bed 6600 cGy in 33 sessions   . Sleep apnea   . Ventricular tachycardia ([place])    a. s/p ICD (generator change 09/2012). b. 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 and placed on sotalol    Past Surgical History:  Procedure Laterality Date  . CARDIAC DEFIBRILLATOR PLACEMENT  03/11/2004   Medtronic Maximo single lead  . COLONOSCOPY W/ BIOPSIES AND POLYPECTOMY  08/2004, 02/24/11   13mm adenoma in 2006,  5 mm polyp (not recovered) 2012  . CYSTOSCOPY WITH DIRECT VISION INTERNAL URETHROTOMY N/A 10/08/2015   Procedure: CYSTOSCOPY WITH DIRECT VISION INTERNAL URETHROTOMY;  Surgeon: Nickie Retort, MD;  Location: [facility];  Service: Urology;  Laterality: N/A;  . IMPLANTABLE CARDIOVERTER DEFIBRILLATOR GENERATOR CHANGE N/A 10/07/2012   Procedure: IMPLANTABLE CARDIOVERTER DEFIBRILLATOR GENERATOR CHANGE;  Surgeon: Evans Lance, MD;  Location: [facility] CATH LAB;  Service: Cardiovascular;  Laterality: N/A;  . PROSTATECTOMY  06/2003   Dr Janice Norrie  . RECTAL SURGERY    . UPPER GASTROINTESTINAL ENDOSCOPY  08/2004, 02/24/11   Barrett's esophagus    Social History   Socioeconomic History  . Marital status: Married    Spouse name: None  . Number of children: 2  . Years of education: None  . Highest education level: None  Social Needs  . Financial resource strain: None  . Food insecurity - worry: None  . Food insecurity - inability: None  . Transportation needs - medical: None  . Transportation needs - non-medical: None  Occupational History  . Occupation: Wellsite geologist: [employer]  Tobacco Use  . Smoking status: Never Smoker  . Smokeless tobacco: Never Used  Substance and Sexual Activity  . Alcohol use: No  . Drug use: No  . Sexual activity: None  Other Topics Concern  . None  Social History Narrative  . None    Family History  Problem Relation Age of Onset  . Coronary artery disease Father   . Prostate cancer Father   . Diabetes Father   . Hypertension Mother   . Diabetes Paternal Grandmother   . Stroke Neg Hx     Review of Systems  Constitutional: Negative for chills and fever.  Respiratory: Negative for cough, shortness of breath and wheezing.   Cardiovascular: Positive for palpitations. Negative for chest pain and leg swelling.  Neurological: Negative for light-headedness and headaches.  Objective:   Vitals:   06/17/17 0916  BP: (!) 142/84  Pulse: 89  Resp: 16  Temp: 98.1 F (36.7 C)  SpO2: 98%   Wt Readings from Last 3 Encounters:  06/17/17 225 lb (102.1 kg)  12/14/16 229 lb (103.9 kg)  11/17/16 229 lb 9.6 oz (104.1 kg)   Body mass index is 31.38 kg/m.   Physical Exam    Constitutional: Appears well-developed and well-nourished. No distress.  HENT:  Head: Normocephalic and atraumatic.  Neck: Neck supple. No tracheal deviation present. No thyromegaly present.  No cervical lymphadenopathy Cardiovascular: Normal rate, regular rhythm and normal heart sounds.   No murmur heard. No carotid bruit .  Trace pitting bilateral lower extremity edema Pulmonary/Chest: Effort normal and breath sounds normal. No respiratory distress. No has no wheezes. No rales.  Skin: Skin is warm and dry. Not diaphoretic.  Psychiatric: Normal mood and affect. Behavior is normal.      Assessment & Plan:    See Problem List for Assessment and Plan of chronic medical problems.

## 2017-06-17 ENCOUNTER — Ambulatory Visit (INDEPENDENT_AMBULATORY_CARE_PROVIDER_SITE_OTHER): Payer: Medicare Other | Admitting: Internal Medicine

## 2017-06-17 ENCOUNTER — Encounter: Payer: Self-pay | Admitting: Internal Medicine

## 2017-06-17 ENCOUNTER — Ambulatory Visit (INDEPENDENT_AMBULATORY_CARE_PROVIDER_SITE_OTHER): Payer: Medicare Other | Admitting: *Deleted

## 2017-06-17 ENCOUNTER — Other Ambulatory Visit (INDEPENDENT_AMBULATORY_CARE_PROVIDER_SITE_OTHER): Payer: Medicare Other

## 2017-06-17 VITALS — BP 142/84 | HR 89 | Temp 98.1°F | Resp 16 | Wt 225.0 lb

## 2017-06-17 DIAGNOSIS — E7849 Other hyperlipidemia: Secondary | ICD-10-CM | POA: Diagnosis not present

## 2017-06-17 DIAGNOSIS — Z23 Encounter for immunization: Secondary | ICD-10-CM | POA: Diagnosis not present

## 2017-06-17 DIAGNOSIS — I428 Other cardiomyopathies: Secondary | ICD-10-CM | POA: Diagnosis not present

## 2017-06-17 DIAGNOSIS — K219 Gastro-esophageal reflux disease without esophagitis: Secondary | ICD-10-CM | POA: Diagnosis not present

## 2017-06-17 DIAGNOSIS — E1151 Type 2 diabetes mellitus with diabetic peripheral angiopathy without gangrene: Secondary | ICD-10-CM | POA: Diagnosis not present

## 2017-06-17 DIAGNOSIS — I10 Essential (primary) hypertension: Secondary | ICD-10-CM | POA: Diagnosis not present

## 2017-06-17 DIAGNOSIS — I5022 Chronic systolic (congestive) heart failure: Secondary | ICD-10-CM | POA: Diagnosis not present

## 2017-06-17 DIAGNOSIS — Z0000 Encounter for general adult medical examination without abnormal findings: Secondary | ICD-10-CM

## 2017-06-17 LAB — CBC WITH DIFFERENTIAL/PLATELET
Basophils Absolute: 0 10*3/uL (ref 0.0–0.1)
Basophils Relative: 0.2 % (ref 0.0–3.0)
EOS ABS: 0.1 10*3/uL (ref 0.0–0.7)
Eosinophils Relative: 2 % (ref 0.0–5.0)
HEMATOCRIT: 37.1 % — AB (ref 39.0–52.0)
HEMOGLOBIN: 11.9 g/dL — AB (ref 13.0–17.0)
LYMPHS PCT: 21.1 % (ref 12.0–46.0)
Lymphs Abs: 1.5 10*3/uL (ref 0.7–4.0)
MCHC: 32.1 g/dL (ref 30.0–36.0)
MCV: 72.8 fl — ABNORMAL LOW (ref 78.0–100.0)
Monocytes Absolute: 0.6 10*3/uL (ref 0.1–1.0)
Monocytes Relative: 8.8 % (ref 3.0–12.0)
Neutro Abs: 4.8 10*3/uL (ref 1.4–7.7)
Neutrophils Relative %: 67.9 % (ref 43.0–77.0)
PLATELETS: 239 10*3/uL (ref 150.0–400.0)
RBC: 5.09 Mil/uL (ref 4.22–5.81)
RDW: 15.7 % — ABNORMAL HIGH (ref 11.5–15.5)
WBC: 7.1 10*3/uL (ref 4.0–10.5)

## 2017-06-17 LAB — COMPREHENSIVE METABOLIC PANEL
ALK PHOS: 140 U/L — AB (ref 39–117)
ALT: 19 U/L (ref 0–53)
AST: 18 U/L (ref 0–37)
Albumin: 3.8 g/dL (ref 3.5–5.2)
BUN: 14 mg/dL (ref 6–23)
CO2: 30 mEq/L (ref 19–32)
Calcium: 8.9 mg/dL (ref 8.4–10.5)
Chloride: 107 mEq/L (ref 96–112)
Creatinine, Ser: 0.99 mg/dL (ref 0.40–1.50)
GFR: 97.24 mL/min (ref >60.00)
GLUCOSE: 123 mg/dL — AB (ref 70–99)
POTASSIUM: 3.6 meq/L (ref 3.5–5.1)
Sodium: 143 mEq/L (ref 135–145)
TOTAL PROTEIN: 7.5 g/dL (ref 6.0–8.3)
Total Bilirubin: 0.5 mg/dL (ref 0.2–1.2)

## 2017-06-17 LAB — TSH: TSH: 2.02 u[IU]/mL (ref 0.35–4.50)

## 2017-06-17 LAB — HEMOGLOBIN A1C: HEMOGLOBIN A1C: 6.9 % — AB (ref 4.6–6.5)

## 2017-06-17 NOTE — Patient Instructions (Addendum)
  Test(s) ordered today. Your results will be released to [place] (or called to you) after review, usually within 72hours after test completion. If any changes need to be made, you will be notified at that same time.  All other Health Maintenance issues reviewed.   All recommended immunizations and age-appropriate screenings are up-to-date or discussed.  Flu immunization administered today.     Medications reviewed and updated.  No changes recommended at this time.   Please followup in 6 months  Continue doing brain stimulating activities (puzzles, reading, adult coloring books, staying active) to keep memory sharp.   Continue to eat heart healthy diet (full of fruits, vegetables, whole grains, lean protein, water--limit salt, fat, and sugar intake) and increase physical activity as tolerated.   [name] , Thank you for taking time to come for your Medicare Wellness Visit. I appreciate your ongoing commitment to your health goals. Please review the following plan we discussed and let me know if I can assist you in the future.   These are the goals we discussed: Goals    . Patient Stated     I plan to take time for myself, continue to eat healthy and increase water intake.       This is a list of the screening recommended for you and due dates:  Health Maintenance  Topic Date Due  . Complete foot exam   07/17/2015  . Eye exam for diabetics  02/07/2016  . Hemoglobin A1C  09/21/2017  . Pneumonia vaccines (2 of 2 - PPSV23) 12/14/2017  . Colon Cancer Screening  02/23/2018  . Tetanus Vaccine  11/30/2022  . Flu Shot  Completed  .  Hepatitis C: One time screening is recommended by Center for Disease Control  (CDC) for  adults born from [birth year] through 1964.   Completed

## 2017-06-17 NOTE — Assessment & Plan Note (Signed)
Sugars have been very well controlled We will check A1c Continue diabetic diet-probably can make some improvements Continue to be active Ideally work on weight loss Follow-up in 6 months

## 2017-06-17 NOTE — Assessment & Plan Note (Signed)
Euvolemic No symptoms currently Continue current medications Encouraged weight loss

## 2017-06-17 NOTE — Assessment & Plan Note (Signed)
GERD controlled History of Barrett's Continue daily medication-PPI and H2 blocker

## 2017-06-17 NOTE — Progress Notes (Signed)
Remote ICD transmission.   

## 2017-06-17 NOTE — Progress Notes (Signed)
Subjective:   [name] is a 67 y.o. male who presents for Medicare Annual/Subsequent preventive examination.  Review of Systems:  No ROS.  Medicare Wellness Visit. Additional risk factors are reflected in the social history.  Cardiac Risk Factors include: advanced age (>22men, >71 women);diabetes mellitus;dyslipidemia;hypertension;male gender  Sleep patterns: feels rested on waking, gets up 1 times nightly to void and sleeps 5-6 hours nightly. Patient reports insomnia issues, discussed recommended sleep tips.    Home Safety/Smoke Alarms: Feels safe in home. Smoke alarms in place.  Living environment; residence and Firearm Safety: 2-story house, no firearms. Lives with wife, no needs for DME, good support system Seat Belt Safety/Bike Helmet: Wears seat belt.    Objective:    Vitals: BP (!) 142/84   Pulse 89   Temp 98.1 F (36.7 C) (Oral)   Resp 16   Wt 225 lb (102.1 kg)   SpO2 98%   BMI 31.38 kg/m   Body mass index is 31.38 kg/m.  Advanced Directives 06/17/2017 10/04/2015 01/01/2015 09/27/2014 10/07/2012 10/07/2012  Does Patient Have a Medical Advance Directive? No No Yes No Patient does not have advance directive Patient does not have advance directive  Does patient want to make changes to medical advance directive? - - Yes - information given - - -  Would patient like information on creating a medical advance directive? Yes (ED - Information included in AVS) No - patient declined information - No - patient declined information - -  Pre-existing out of facility DNR order (yellow form or pink MOST form) - - - - No -    Tobacco Social History   Tobacco Use  Smoking Status Never Smoker  Smokeless Tobacco Never Used     Counseling given: Not Answered  Past Medical History:  Diagnosis Date  . AICD (automatic cardioverter/defibrillator) present   . Anxiety    since defibrillator placement  . Arthritis    knees  . Cataract   . Chronic systolic heart failure ([place])   .  Colon polyps    tubular adenoma  . Diabetes mellitus   . Dysrhythmia   . GERD (gastroesophageal reflux disease)   . Hx of myocardial infarction   . Hyperlipidemia   . Hypertension   . NICM (nonischemic cardiomyopathy) ([place])    a.normal cors by cath in 2005. b. s/p Medtronic ICD.  [name] Pacemaker   . Prostate cancer ([place]) 06/2003   s/p prostatectomy  . S/P radiation therapy 10/10/2014 through 11/26/2014    Prostate bed 6600 cGy in 33 sessions   . Sleep apnea   . Ventricular tachycardia ([place])    a. s/p ICD (generator change 09/2012). b. 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 and placed on sotalol   Past Surgical History:  Procedure Laterality Date  . CARDIAC DEFIBRILLATOR PLACEMENT  03/11/2004   Medtronic Maximo single lead  . COLONOSCOPY W/ BIOPSIES AND POLYPECTOMY  08/2004, 02/24/11   65mm adenoma in 2006,  5 mm polyp (not recovered) 2012  . CYSTOSCOPY WITH DIRECT VISION INTERNAL URETHROTOMY N/A 10/08/2015   Procedure: CYSTOSCOPY WITH DIRECT VISION INTERNAL URETHROTOMY;  Surgeon: Nickie Retort, MD;  Location: [facility];  Service: Urology;  Laterality: N/A;  . IMPLANTABLE CARDIOVERTER DEFIBRILLATOR GENERATOR CHANGE N/A 10/07/2012   Procedure: IMPLANTABLE CARDIOVERTER DEFIBRILLATOR GENERATOR CHANGE;  Surgeon: Evans Lance, MD;  Location: [facility] CATH LAB;  Service: Cardiovascular;  Laterality: N/A;  . PROSTATECTOMY  06/2003   Dr Janice Norrie  . RECTAL SURGERY    . UPPER GASTROINTESTINAL ENDOSCOPY  08/2004, 02/24/11   Barrett's esophagus   Family History  Problem Relation Age of Onset  . Coronary artery disease Father   . Prostate cancer Father   . Diabetes Father   . Hypertension Mother   . Diabetes Paternal Grandmother   . Stroke Neg Hx    Social History   Socioeconomic History  . Marital status: Married    Spouse name: None  . Number of children: 2  . Years of education: None  . Highest education level: None  Social Needs  .  Financial resource strain: None  . Food insecurity - worry: None  . Food insecurity - inability: None  . Transportation needs - medical: None  . Transportation needs - non-medical: None  Occupational History  . 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 Smoking status: Never Smoker  . Smokeless tobacco: Never Used  Substance and Sexual Activity  . Alcohol use: No  . Drug use: No  . Sexual activity: None  Other Topics Concern  . None  Social History Narrative  . None    Outpatient Encounter Medications as of 06/17/2017  Medication Sig  . amLODipine (NORVASC) 5 MG tablet TAKE 1 TABLET (5 MG TOTAL) BY MOUTH DAILY.  [name] aspirin EC 81 MG tablet Take 81 mg by mouth daily.  [name] atorvastatin (LIPITOR) 20 MG tablet TAKE 1 TABLET BY MOUTH DAILY EXCEPT 1/2 TABLET ON TUESDAY THURSDAY AND SATURDAY (MAX ON INS)  . carvedilol (COREG) 12.5 MG tablet TAKE 1 AND 1/2 TABLETS BY MOUTH TWICE A DAY WITH MEALS (MAX ON INS)  . clonazePAM (KLONOPIN) 0.5 MG tablet Take 0.5 mg by mouth 3 (three) times daily as needed for anxiety.   . digoxin (LANOXIN) 0.125 MG tablet TAKE 1 TABLET BY MOUTH EVERY DAY  . famotidine (PEPCID) 20 MG tablet Take 1 tablet (20 mg total) by mouth 2 (two) times daily.  [name] glucose blood (ONE TOUCH ULTRA TEST) test strip USE TO CHECK BLOOD SUGAR TWICE DAILY  . JANUMET 50-1000 MG tablet TAKE 1 TABLET BY MOUTH DAILY WITH SUPPER (MAX ON INS)  . Lancets (ONETOUCH ULTRASOFT) lancets Use as instructed to test sugar daily and prn. Dx E11.9  . omeprazole (PRILOSEC) 20 MG capsule TAKE 1 CAPSULE BY MOUTH DAILY. RECOMMENDED TO TAKE 30 MIN BEFORE BREAKFAST. *MAX PER INSURANCE  . ramipril (ALTACE) 10 MG capsule TAKE 1 CAPSULE (10 MG TOTAL) BY MOUTH 2 (TWO) TIMES DAILY.  . sotalol (BETAPACE) 120 MG tablet TAKE 1 TABLET BY MOUTH TWICE A DAY  . vitamin B-12 (CYANOCOBALAMIN) 1000 MCG tablet Take 1,000 mcg by mouth daily.  . [DISCONTINUED] ibuprofen (ADVIL,MOTRIN) 200 MG tablet Take 600  mg by mouth every 6 (six) hours as needed (For pain.).   No facility-administered encounter medications on file as of 06/17/2017.     Activities of Daily Living In your present state of health, do you have any difficulty performing the following activities: 06/17/2017  Hearing? N  Vision? N  Difficulty concentrating or making decisions? N  Walking or climbing stairs? N  Dressing or bathing? N  Doing errands, shopping? N  Preparing Food and eating ? N  Using the Toilet? N  In the past six months, have you accidently leaked urine? N  Do you have problems with loss of bowel control? N  Managing your Medications? N  Managing your Finances? N  Housekeeping or managing your Housekeeping? N  Some recent data might be hidden    Patient  Care Team: Binnie Rail, MD as PCP - General (Internal Medicine)   Assessment:   This is a routine wellness examination for Jozsef. Physical assessment deferred to PCP.   Exercise Activities and Dietary recommendations Current Exercise Habits: The patient has a physically strenous job, but has no regular exercise apart from work.(works at [employer] full-time and states he walks the majority of the time), Exercise limited by: orthopedic condition(s)  Diet (meal preparation, eat out, water intake, caffeinated beverages, dairy products, fruits and vegetables): in general, a "healthy" diet  , monitors sugar and carbohydrates.   Reviewed heart healthy and diabetic diet, encouraged patient to increase daily water intake. Goals    . Patient Stated     I plan to take time for myself, continue to eat healthy and increase water intake.       Fall Risk Fall Risk  06/17/2017 06/17/2017 06/16/2016 01/01/2015 09/27/2014  Falls in the past year? No No Yes No No  Number falls in past yr: - - 1 - -  Injury with Fall? - - No - -    Depression Screen PHQ 2/9 Scores 06/17/2017 06/17/2017 06/16/2016 01/01/2015  PHQ - 2 Score 1 0 0 0  PHQ- 9 Score 3 - - -    Cognitive  Function       Ad8 score reviewed for issues:  Issues making decisions: no  Less interest in hobbies / activities: no  Repeats questions, stories (family complaining): no  Trouble using ordinary gadgets (microwave, computer, phone):no  Forgets the month or year: no  Mismanaging finances: no  Remembering appts: no  Daily problems with thinking and/or memory: no Ad8 score is= 0  Immunization History  Administered Date(s) Administered  . Influenza Split 03/10/2012  . Influenza, High Dose Seasonal PF 06/16/2016, 06/17/2017  . Influenza, Seasonal, Injecte, Preservative Fre 03/10/2014  . Influenza,inj,Quad PF,6+ Mos 03/02/2013  . Pneumococcal Conjugate-13 12/14/2016  . Tdap 11/29/2012   Screening Tests Health Maintenance  Topic Date Due  . FOOT EXAM  07/17/2015  . OPHTHALMOLOGY EXAM  02/07/2016  . HEMOGLOBIN A1C  09/21/2017  . PNA vac Low Risk Adult (2 of 2 - PPSV23) 12/14/2017  . COLONOSCOPY  02/23/2018  . TETANUS/TDAP  11/30/2022  . INFLUENZA VACCINE  Completed  . Hepatitis C Screening  Completed      Plan:    Continue doing brain stimulating activities (puzzles, reading, adult coloring books, staying active) to keep memory sharp.   Continue to eat heart healthy diet (full of fruits, vegetables, whole grains, lean protein, water--limit salt, fat, and sugar intake) and increase physical activity as tolerated.   I have personally reviewed and noted the following in the patient's chart:   . Medical and social history . Use of alcohol, tobacco or illicit drugs  . Current medications and supplements . Functional ability and status . Nutritional status . Physical activity . Advanced directives . List of other physicians . Vitals . Screenings to include cognitive, depression, and falls . Referrals and appointments  In addition, I have reviewed and discussed with patient certain preventive protocols, quality metrics, and best practice recommendations. A written  personalized care plan for preventive services as well as general preventive health recommendations were provided to patient.     Michiel Cowboy, RN  06/17/2017   Medical screening examination/treatment/procedure(s) were performed by non-physician practitioner and as supervising physician I was immediately available for consultation/collaboration. I agree with above. Binnie Rail, MD

## 2017-06-17 NOTE — Assessment & Plan Note (Signed)
Blood pressure slightly elevated here today  BP Readings from Last 3 Encounters:  06/17/17 (!) 142/84  12/14/16 126/72  11/17/16 130/78   Overall has been well controlled plan continue current medications at current doses CMP

## 2017-06-17 NOTE — Assessment & Plan Note (Signed)
Check lipid panel  Continue daily statin Regular exercise and healthy diet encouraged  

## 2017-06-23 ENCOUNTER — Encounter: Payer: Self-pay | Admitting: Cardiology

## 2017-07-06 ENCOUNTER — Other Ambulatory Visit: Payer: Self-pay | Admitting: Internal Medicine

## 2017-07-08 LAB — CUP PACEART REMOTE DEVICE CHECK
Battery Voltage: 3.01 V
Date Time Interrogation Session: 20190207093624
HIGH POWER IMPEDANCE MEASURED VALUE: 66 Ohm
Implantable Lead Implant Date: 20140530
Implantable Pulse Generator Implant Date: 20140530
Lead Channel Impedance Value: 361 Ohm
Lead Channel Pacing Threshold Amplitude: 0.375 V
Lead Channel Pacing Threshold Pulse Width: 0.4 ms
Lead Channel Sensing Intrinsic Amplitude: 7.375 mV
Lead Channel Setting Pacing Amplitude: 2.5 V
MDC IDC LEAD LOCATION: 753860
MDC IDC MSMT BATTERY REMAINING LONGEVITY: 95 mo
MDC IDC MSMT LEADCHNL RV IMPEDANCE VALUE: 456 Ohm
MDC IDC MSMT LEADCHNL RV SENSING INTR AMPL: 7.375 mV
MDC IDC SET LEADCHNL RV PACING PULSEWIDTH: 0.4 ms
MDC IDC SET LEADCHNL RV SENSING SENSITIVITY: 0.3 mV
MDC IDC STAT BRADY RV PERCENT PACED: 0 %

## 2017-07-19 ENCOUNTER — Other Ambulatory Visit: Payer: Self-pay | Admitting: Internal Medicine

## 2017-08-07 ENCOUNTER — Other Ambulatory Visit: Payer: Self-pay | Admitting: Internal Medicine

## 2017-08-29 ENCOUNTER — Other Ambulatory Visit: Payer: Self-pay | Admitting: Internal Medicine

## 2017-09-01 ENCOUNTER — Other Ambulatory Visit: Payer: Self-pay | Admitting: Internal Medicine

## 2017-09-05 ENCOUNTER — Other Ambulatory Visit: Payer: Self-pay | Admitting: Internal Medicine

## 2017-09-16 ENCOUNTER — Ambulatory Visit (INDEPENDENT_AMBULATORY_CARE_PROVIDER_SITE_OTHER): Payer: Medicare Other | Admitting: *Deleted

## 2017-09-16 DIAGNOSIS — I428 Other cardiomyopathies: Secondary | ICD-10-CM

## 2017-09-16 NOTE — Progress Notes (Signed)
Remote ICD transmission.   

## 2017-09-21 ENCOUNTER — Encounter: Payer: Self-pay | Admitting: Cardiology

## 2017-10-06 ENCOUNTER — Other Ambulatory Visit: Payer: Self-pay | Admitting: Internal Medicine

## 2017-10-12 LAB — CUP PACEART REMOTE DEVICE CHECK
Battery Remaining Longevity: 92 mo
Battery Voltage: 3.01 V
Brady Statistic RV Percent Paced: 0.01 %
HighPow Impedance: 67 Ohm
Implantable Lead Location: 753860
Implantable Lead Model: 6935
Lead Channel Impedance Value: 399 Ohm
Lead Channel Impedance Value: 456 Ohm
Lead Channel Setting Pacing Amplitude: 2.5 V
Lead Channel Setting Sensing Sensitivity: 0.3 mV
MDC IDC LEAD IMPLANT DT: 20140530
MDC IDC MSMT LEADCHNL RV PACING THRESHOLD AMPLITUDE: 0.375 V
MDC IDC MSMT LEADCHNL RV PACING THRESHOLD PULSEWIDTH: 0.4 ms
MDC IDC MSMT LEADCHNL RV SENSING INTR AMPL: 7.125 mV
MDC IDC MSMT LEADCHNL RV SENSING INTR AMPL: 7.125 mV
MDC IDC PG IMPLANT DT: 20140530
MDC IDC SESS DTM: 20190509143726
MDC IDC SET LEADCHNL RV PACING PULSEWIDTH: 0.4 ms

## 2017-11-01 ENCOUNTER — Encounter: Payer: Self-pay | Admitting: Internal Medicine

## 2017-11-01 ENCOUNTER — Other Ambulatory Visit (INDEPENDENT_AMBULATORY_CARE_PROVIDER_SITE_OTHER): Payer: Medicare Other

## 2017-11-01 ENCOUNTER — Ambulatory Visit (INDEPENDENT_AMBULATORY_CARE_PROVIDER_SITE_OTHER): Payer: Medicare Other | Admitting: Internal Medicine

## 2017-11-01 ENCOUNTER — Ambulatory Visit (INDEPENDENT_AMBULATORY_CARE_PROVIDER_SITE_OTHER)
Admission: RE | Admit: 2017-11-01 | Discharge: 2017-11-01 | Disposition: A | Discharge status: Home / Self Care | Payer: Medicare Other | Source: Ambulatory Visit | Attending: Internal Medicine | Admitting: Internal Medicine

## 2017-11-01 VITALS — BP 134/78 | HR 77 | Temp 98.2°F | Resp 16 | Wt 225.0 lb

## 2017-11-01 DIAGNOSIS — R05 Cough: Secondary | ICD-10-CM | POA: Diagnosis not present

## 2017-11-01 DIAGNOSIS — R062 Wheezing: Secondary | ICD-10-CM

## 2017-11-01 DIAGNOSIS — E1151 Type 2 diabetes mellitus with diabetic peripheral angiopathy without gangrene: Secondary | ICD-10-CM

## 2017-11-01 DIAGNOSIS — R059 Cough, unspecified: Secondary | ICD-10-CM | POA: Insufficient documentation

## 2017-11-01 LAB — HEMOGLOBIN A1C: Hgb A1c MFr Bld: 6.9 % — ABNORMAL HIGH (ref 4.6–6.5)

## 2017-11-01 MED ORDER — DOXYCYCLINE HYCLATE 100 MG PO TABS: 100.0000 mg | ORAL_TABLET | Freq: Two times a day (BID) | ORAL | 0 refills | Status: DC

## 2017-11-01 NOTE — Assessment & Plan Note (Signed)
Experiencing wheezing associated with cold symptoms Mild wheeze on exam Will check chest x-ray to rule out pneumonia Start doxycycline twice daily x10 days Continue over-the-counter cold medications Call if no improvement

## 2017-11-01 NOTE — Assessment & Plan Note (Signed)
He is taking his medication daily He would like his A1c checked see if he needs to do more with his lifestyle Sugars at home appear well controlled Check A1c

## 2017-11-01 NOTE — Progress Notes (Signed)
Subjective:    Patient ID: [name], male    DOB: 18-Jan-1951, 67 y.o.   MRN: [id number]  HPI He is here for an acute visit for cold symptoms.  His symptoms started 3 weeks ago. He is experiencing nasal congestion, sinus pressure, productive cough, wheezing, chest pain related to the cough and intermittent nausea.  He denies any fevers, chills or shortness of breath.  He does intermittently hear a gurgling sound in his chest in addition to the wheezing.  That was 1 of the more concerning things for him in addition to his symptoms being persistent for so long.  He has tried taking mucinex and coricidin.     Diabetes:  He is taking his medication daily as prescribed.  He is interested in having his A1c checked today if possible also he knows if he needs to make further dietary changes.  His sugars at home can range from 120-148.  Medications and allergies reviewed with patient and updated if appropriate.  Patient Active Problem List   Diagnosis Date Noted  . Left hip pain 12/14/2016  . Left knee pain 12/14/2016  . Shingles 07/28/2016  . GERD (gastroesophageal reflux disease) 07/29/2015  . Malignant neoplasm of prostate ([place]) 09/27/2014  . RLS (restless legs syndrome) 11/29/2012  . Obstructive sleep apnea 11/10/2012  . Elevated alkaline phosphatase level 10/04/2012  . Vitamin B 12 deficiency 03/06/2011  . Chronic systolic heart failure ([place]) 01/27/2011  . Mild anemia 01/20/2011  . Ventricular tachycardia ([place])   . NICM (nonischemic cardiomyopathy) ([place])   . Diabetes ([place]) 08/26/2009  . Hyperlipidemia 08/26/2009  . Essential hypertension 08/26/2009  . PROSTATE CANCER, HX OF 08/26/2009  . 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  . Personal history of adenomatous colonic polyps 09/05/2004  . Barrett's esophagus 09/05/2004    Current Outpatient Medications on File Prior to Visit  Medication Sig Dispense Refill  . amLODipine (NORVASC) 5 MG tablet  TAKE 1 TABLET BY MOUTH EVERY DAY 90 tablet 1  . aspirin EC 81 MG tablet Take 81 mg by mouth daily.    [name] atorvastatin (LIPITOR) 20 MG tablet TAKE 1 TABLET BY MOUTH DAILY EXCEPT 1/2 TABLET ON TUESDAY THURSDAY AND SATURDAY (MAX ON INS) 90 tablet 1  . carvedilol (COREG) 12.5 MG tablet TAKE 1 AND 1/2 TABLETS BY MOUTH TWICE A DAY WITH MEALS (MAX ON INS) 90 tablet 5  . clonazePAM (KLONOPIN) 0.5 MG tablet Take 0.5 mg by mouth 3 (three) times daily as needed for anxiety.     . digoxin (LANOXIN) 0.125 MG tablet TAKE 1 TABLET BY MOUTH EVERY DAY 30 tablet 5  . famotidine (PEPCID) 20 MG tablet TAKE 1 TABLET BY MOUTH TWICE A DAY 60 tablet 5  . glucose blood (ONE TOUCH ULTRA TEST) test strip USE TO CHECK BLOOD SUGAR TWICE DAILY 100 each 3  . JANUMET 50-1000 MG tablet TAKE 1 TABLET BY MOUTH DAILY WITH SUPPER 90 tablet 1  . Lancets (ONETOUCH ULTRASOFT) lancets Use as instructed to test sugar daily and prn. Dx E11.9 100 each 12  . omeprazole (PRILOSEC) 20 MG capsule TAKE 1 CAPSULE BY MOUTH DAILY. RECOMMENDED TO TAKE 30 MIN BEFORE BREAKFAST. *MAX PER INSURANCE 90 capsule 3  . ramipril (ALTACE) 10 MG capsule TAKE 1 CAPSULE (10 MG TOTAL) BY MOUTH 2 (TWO) TIMES DAILY. 180 capsule 1  . sotalol (BETAPACE) 120 MG tablet TAKE 1 TABLET BY MOUTH TWICE A DAY 180 tablet 1  . vitamin B-12 (CYANOCOBALAMIN) 1000 MCG tablet  Take 1,000 mcg by mouth daily.     No current facility-administered medications on file prior to visit.     Past Medical History:  Diagnosis Date  . AICD (automatic cardioverter/defibrillator) present   . Anxiety    since defibrillator placement  . Arthritis    knees  . Cataract   . Chronic systolic heart failure ([place])   . Colon polyps    tubular adenoma  . Diabetes mellitus   . Dysrhythmia   . GERD (gastroesophageal reflux disease)   . Hx of myocardial infarction   . Hyperlipidemia   . Hypertension   . NICM (nonischemic cardiomyopathy) ([place])    a.normal cors by cath in 2005. b. s/p Medtronic  ICD.  [name] Pacemaker   . Prostate cancer ([place]) 06/2003   s/p prostatectomy  . S/P radiation therapy 10/10/2014 through 11/26/2014    Prostate bed 6600 cGy in 33 sessions   . Sleep apnea   . Ventricular tachycardia ([place])    a. s/p ICD (generator change 09/2012). b. 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 and placed on sotalol    Past Surgical History:  Procedure Laterality Date  . CARDIAC DEFIBRILLATOR PLACEMENT  03/11/2004   Medtronic Maximo single lead  . COLONOSCOPY W/ BIOPSIES AND POLYPECTOMY  08/2004, 02/24/11   40mm adenoma in 2006,  5 mm polyp (not recovered) 2012  . CYSTOSCOPY WITH DIRECT VISION INTERNAL URETHROTOMY N/A 10/08/2015   Procedure: CYSTOSCOPY WITH DIRECT VISION INTERNAL URETHROTOMY;  Surgeon: Nickie Retort, MD;  Location: [facility];  Service: Urology;  Laterality: N/A;  . IMPLANTABLE CARDIOVERTER DEFIBRILLATOR GENERATOR CHANGE N/A 10/07/2012   Procedure: IMPLANTABLE CARDIOVERTER DEFIBRILLATOR GENERATOR CHANGE;  Surgeon: Evans Lance, MD;  Location: [facility] CATH LAB;  Service: Cardiovascular;  Laterality: N/A;  . PROSTATECTOMY  06/2003   Dr Janice Norrie  . RECTAL SURGERY    . UPPER GASTROINTESTINAL ENDOSCOPY  08/2004, 02/24/11   Barrett's esophagus    Social History   Socioeconomic History  . Marital status: Married    Spouse name: Not on file  . Number of children: 2  . Years of education: Not on file  . Highest education level: Not on file  Occupational History  . 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 Financial resource strain: Not on file  . Food insecurity:    Worry: Not on file    Inability: Not on file  . Transportation needs:    Medical: Not on file    Non-medical: Not on file  Tobacco Use  . Smoking status: Never Smoker  . Smokeless tobacco: Never Used  Substance and Sexual Activity  . Alcohol use: No  . Drug use: No  . Sexual activity: Not on file  Lifestyle  . Physical  activity:    Days per week: Not on file    Minutes per session: Not on file  . Stress: Not on file  Relationships  . Social connections:    Talks on phone: Not on file    Gets together: Not on file    Attends religious service: Not on file    Active member of club or organization: Not on file    Attends meetings of clubs or organizations: Not on file    Relationship status: Not on file  Other Topics Concern  . Not on file  Social History Narrative  . Not on file    Family History  Problem Relation Age of Onset  . Coronary artery disease Father   .  Prostate cancer Father   . Diabetes Father   . Hypertension Mother   . Diabetes Paternal Grandmother   . Stroke Neg Hx     Review of Systems  Constitutional: Negative for chills and fever.  HENT: Positive for congestion and sinus pressure. Negative for ear pain and sore throat.   Respiratory: Positive for cough (productive) and wheezing. Negative for shortness of breath.   Cardiovascular: Positive for chest pain (from cold symptoms).  Gastrointestinal: Positive for nausea. Negative for diarrhea.  Musculoskeletal: Negative for myalgias.  Neurological: Negative for light-headedness and headaches.       Objective:   Vitals:   11/01/17 1035  BP: 134/78  Pulse: 77  Resp: 16  Temp: 98.2 F (36.8 C)  SpO2: 98%   Filed Weights   11/01/17 1035  Weight: 225 lb (102.1 kg)   Body mass index is 31.38 kg/m.  Wt Readings from Last 3 Encounters:  11/01/17 225 lb (102.1 kg)  06/17/17 225 lb (102.1 kg)  12/14/16 229 lb (103.9 kg)     Physical Exam GENERAL APPEARANCE: Appears stated age, well appearing, NAD EYES: conjunctiva clear, no icterus HEENT: bilateral tympanic membranes and ear canals normal, oropharynx with mild erythema, no thyromegaly, trachea midline, no cervical or supraclavicular lymphadenopathy LUNGS: unlabored breathing, good air entry bilaterally, no crackles, mild wheeze  on right side CARDIOVASCULAR:  Normal S1,S2 without murmurs, no edema SKIN: warm, dry        Assessment & Plan:   See Problem List for Assessment and Plan of chronic medical problems.

## 2017-11-01 NOTE — Patient Instructions (Addendum)
Have a chest xray today.  Have your a1c done the lab.  We will call you with the result.   Start doxycycline  - take twice a day for 10 days.   Take mucinex and coricidin as needed.

## 2017-11-15 ENCOUNTER — Other Ambulatory Visit: Payer: Self-pay | Admitting: Internal Medicine

## 2017-11-15 LAB — HM DIABETES EYE EXAM

## 2017-12-01 ENCOUNTER — Other Ambulatory Visit: Payer: Self-pay | Admitting: Internal Medicine

## 2017-12-14 NOTE — Patient Instructions (Addendum)
  Test(s) ordered today. Your results will be released to [facility] (or called to you) after review, usually within 72hours after test completion. If any changes need to be made, you will be notified at that same time.   Prevnar pneumonia administered today.   Medications reviewed and updated.  No changes recommended at this time.    Please followup in 6 months

## 2017-12-14 NOTE — Progress Notes (Signed)
Subjective:    Patient ID: [name], male    DOB: 08/29/1950, 67 y.o.   MRN: [id number]  HPI The patient is here for follow up.  His left hip gives way.  He also has right foot hurts and gives way at times.  He is interested in seeing someone for further evaluation.  Diabetes: He is taking his medication daily as prescribed. He is not compliant with a diabetic diet. He is very active at work, but not exercising regularly.  He checks his feet daily and denies foot lesions. He is up-to-date with an ophthalmology examination.   Hypertension: He is taking his medication daily. He is compliant with a low sodium diet.  He denies chest pain, palpitations, edema, shortness of breath and regular headaches. He is very active at work, but not exercising regularly.      Hyperlipidemia: He is taking his medication daily. He is compliant with a low fat/cholesterol diet. He is not exercising regularly outside of work. He denies myalgias.   GERD:  He is taking his medication daily as prescribed.  He denies any GERD symptoms and feels his GERD is well controlled.   Microcytic anemia: He denies any increase in fatigue.  He denies any abnormal bruising or bleeding.  Medications and allergies reviewed with patient and updated if appropriate.  Patient Active Problem List   Diagnosis Date Noted  . Cough 11/01/2017  . Wheeze 11/01/2017  . Left hip pain 12/14/2016  . Left knee pain 12/14/2016  . Shingles 07/28/2016  . GERD (gastroesophageal reflux disease) 07/29/2015  . Malignant neoplasm of prostate ([place]) 09/27/2014  . RLS (restless legs syndrome) 11/29/2012  . Obstructive sleep apnea 11/10/2012  . Elevated alkaline phosphatase level 10/04/2012  . Vitamin B 12 deficiency 03/06/2011  . Chronic systolic heart failure ([place]) 01/27/2011  . Mild anemia 01/20/2011  . Ventricular tachycardia ([place])   . NICM (nonischemic cardiomyopathy) ([place])   . Diabetes ([place]) 08/26/2009  . Hyperlipidemia 08/26/2009   . Essential hypertension 08/26/2009  . PROSTATE CANCER, HX OF 08/26/2009  . 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  . Personal history of adenomatous colonic polyps 09/05/2004  . Barrett's esophagus 09/05/2004    Current Outpatient Medications on File Prior to Visit  Medication Sig Dispense Refill  . amLODipine (NORVASC) 5 MG tablet TAKE 1 TABLET BY MOUTH EVERY DAY 90 tablet 1  . aspirin EC 81 MG tablet Take 81 mg by mouth daily.    [name] atorvastatin (LIPITOR) 20 MG tablet TAKE 1 TABLET BY MOUTH DAILY EXCEPT 1/2 TABLET ON TUESDAY THURSDAY AND SATURDAY (MAX ON INS) 90 tablet 1  . carvedilol (COREG) 12.5 MG tablet TAKE 1 AND 1/2 TABLETS BY MOUTH TWICE A DAY WITH MEALS (MAX ON INS) 270 tablet 1  . clonazePAM (KLONOPIN) 0.5 MG tablet Take 0.5 mg by mouth 3 (three) times daily as needed for anxiety.     . digoxin (LANOXIN) 0.125 MG tablet TAKE 1 TABLET BY MOUTH EVERY DAY 90 tablet 0  . famotidine (PEPCID) 20 MG tablet TAKE 1 TABLET BY MOUTH TWICE A DAY 60 tablet 5  . glucose blood (ONE TOUCH ULTRA TEST) test strip USE TO CHECK BLOOD SUGAR TWICE DAILY 100 each 3  . JANUMET 50-1000 MG tablet TAKE 1 TABLET BY MOUTH DAILY WITH SUPPER 90 tablet 1  . Lancets (ONETOUCH ULTRASOFT) lancets Use as instructed to test sugar daily and prn. Dx E11.9 100 each 12  . omeprazole (PRILOSEC) 20 MG capsule TAKE 1  CAPSULE BY MOUTH DAILY. RECOMMENDED TO TAKE 30 MIN BEFORE BREAKFAST. *MAX PER INSURANCE 90 capsule 3  . ramipril (ALTACE) 10 MG capsule TAKE 1 CAPSULE (10 MG TOTAL) BY MOUTH 2 (TWO) TIMES DAILY. 180 capsule 1  . sotalol (BETAPACE) 120 MG tablet TAKE 1 TABLET BY MOUTH TWICE A DAY 180 tablet 1  . vitamin B-12 (CYANOCOBALAMIN) 1000 MCG tablet Take 1,000 mcg by mouth daily.     No current facility-administered medications on file prior to visit.     Past Medical History:  Diagnosis Date  . AICD (automatic cardioverter/defibrillator) present   . Anxiety    since defibrillator  placement  . Arthritis    knees  . Cataract   . Chronic systolic heart failure ([place])   . Colon polyps    tubular adenoma  . Diabetes mellitus   . Dysrhythmia   . GERD (gastroesophageal reflux disease)   . Hx of myocardial infarction   . Hyperlipidemia   . Hypertension   . NICM (nonischemic cardiomyopathy) ([place])    a.normal cors by cath in 2005. b. s/p Medtronic ICD.  [name] Pacemaker   . Prostate cancer ([place]) 06/2003   s/p prostatectomy  . S/P radiation therapy 10/10/2014 through 11/26/2014    Prostate bed 6600 cGy in 33 sessions   . Sleep apnea   . Ventricular tachycardia ([place])    a. s/p ICD (generator change 09/2012). b. 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 and placed on sotalol    Past Surgical History:  Procedure Laterality Date  . CARDIAC DEFIBRILLATOR PLACEMENT  03/11/2004   Medtronic Maximo single lead  . COLONOSCOPY W/ BIOPSIES AND POLYPECTOMY  08/2004, 02/24/11   24mm adenoma in 2006,  5 mm polyp (not recovered) 2012  . CYSTOSCOPY WITH DIRECT VISION INTERNAL URETHROTOMY N/A 10/08/2015   Procedure: CYSTOSCOPY WITH DIRECT VISION INTERNAL URETHROTOMY;  Surgeon: Nickie Retort, MD;  Location: [facility];  Service: Urology;  Laterality: N/A;  . IMPLANTABLE CARDIOVERTER DEFIBRILLATOR GENERATOR CHANGE N/A 10/07/2012   Procedure: IMPLANTABLE CARDIOVERTER DEFIBRILLATOR GENERATOR CHANGE;  Surgeon: Evans Lance, MD;  Location: [facility] CATH LAB;  Service: Cardiovascular;  Laterality: N/A;  . PROSTATECTOMY  06/2003   Dr Janice Norrie  . RECTAL SURGERY    . UPPER GASTROINTESTINAL ENDOSCOPY  08/2004, 02/24/11   Barrett's esophagus    Social History   Socioeconomic History  . Marital status: Married    Spouse name: Not on file  . Number of children: 2  . Years of education: Not on file  . Highest education level: Not on file  Occupational History  . Occupation: Therapist, sports: [employer]  . Financial resource strain:  Not on file  . Food insecurity:    Worry: Not on file    Inability: Not on file  . Transportation needs:    Medical: Not on file    Non-medical: Not on file  Tobacco Use  . Smoking status: Never Smoker  . Smokeless tobacco: Never Used  Substance and Sexual Activity  . Alcohol use: No  . Drug use: No  . Sexual activity: Not on file  Lifestyle  . Physical activity:    Days per week: Not on file    Minutes per session: Not on file  . Stress: Not on file  Relationships  . Social connections:    Talks on phone: Not on file    Gets together: Not on file    Attends religious service: Not on file    Active  member of club or organization: Not on file    Attends meetings of clubs or organizations: Not on file    Relationship status: Not on file  Other Topics Concern  . Not on file  Social History Narrative  . Not on file    Family History  Problem Relation Age of Onset  . Coronary artery disease Father   . Prostate cancer Father   . Diabetes Father   . Hypertension Mother   . Diabetes Paternal Grandmother   . Stroke Neg Hx     Review of Systems  Constitutional: Negative for chills and fever.  Respiratory: Negative for cough, shortness of breath and wheezing.   Cardiovascular: Negative for chest pain, palpitations and leg swelling.  Musculoskeletal: Negative for myalgias.  Neurological: Negative for light-headedness and headaches.       Objective:   Vitals:   12/15/17 0905  BP: 138/74  Pulse: 71  Resp: 16  Temp: 98.1 F (36.7 C)  SpO2: 97%   BP Readings from Last 3 Encounters:  12/15/17 138/74  11/01/17 134/78  06/17/17 (!) 142/84   Wt Readings from Last 3 Encounters:  12/15/17 224 lb (101.6 kg)  11/01/17 225 lb (102.1 kg)  06/17/17 225 lb (102.1 kg)   Body mass index is 31.24 kg/m.   Physical Exam    Constitutional: Appears well-developed and well-nourished. No distress.  HENT:  Head: Normocephalic and atraumatic.  Neck: Neck supple. No tracheal  deviation present. No thyromegaly present.  No cervical lymphadenopathy Cardiovascular: Normal rate, regular rhythm and normal heart sounds.   No murmur heard. No carotid bruit .  No edema Pulmonary/Chest: Effort normal and breath sounds normal. No respiratory distress. No has no wheezes. No rales.  Skin: Skin is warm and dry. Not diaphoretic.  Psychiatric: Normal mood and affect. Behavior is normal.      Assessment & Plan:    See Problem List for Assessment and Plan of chronic medical problems.

## 2017-12-15 ENCOUNTER — Other Ambulatory Visit (INDEPENDENT_AMBULATORY_CARE_PROVIDER_SITE_OTHER): Payer: Medicare Other

## 2017-12-15 ENCOUNTER — Ambulatory Visit: Payer: Medicare Other | Admitting: Internal Medicine

## 2017-12-15 ENCOUNTER — Encounter: Payer: Self-pay | Admitting: Internal Medicine

## 2017-12-15 VITALS — BP 138/74 | HR 71 | Temp 98.1°F | Resp 16 | Wt 224.0 lb

## 2017-12-15 DIAGNOSIS — I10 Essential (primary) hypertension: Secondary | ICD-10-CM

## 2017-12-15 DIAGNOSIS — M79671 Pain in right foot: Secondary | ICD-10-CM

## 2017-12-15 DIAGNOSIS — E1151 Type 2 diabetes mellitus with diabetic peripheral angiopathy without gangrene: Secondary | ICD-10-CM

## 2017-12-15 DIAGNOSIS — D649 Anemia, unspecified: Secondary | ICD-10-CM | POA: Diagnosis not present

## 2017-12-15 DIAGNOSIS — E7849 Other hyperlipidemia: Secondary | ICD-10-CM

## 2017-12-15 DIAGNOSIS — Z23 Encounter for immunization: Secondary | ICD-10-CM

## 2017-12-15 DIAGNOSIS — M25552 Pain in left hip: Secondary | ICD-10-CM

## 2017-12-15 DIAGNOSIS — K219 Gastro-esophageal reflux disease without esophagitis: Secondary | ICD-10-CM

## 2017-12-15 HISTORY — DX: Pain in right foot: M79.671

## 2017-12-15 LAB — IRON,TIBC AND FERRITIN PANEL
%SAT: 22 % (calc) (ref 20–48)
FERRITIN: 149 ng/mL (ref 24–380)
Iron: 59 ug/dL (ref 50–180)
TIBC: 269 mcg/dL (calc) (ref 250–425)

## 2017-12-15 LAB — CBC WITH DIFFERENTIAL/PLATELET
Basophils Absolute: 0 10*3/uL (ref 0.0–0.1)
Basophils Relative: 0.4 % (ref 0.0–3.0)
EOS ABS: 0.1 10*3/uL (ref 0.0–0.7)
Eosinophils Relative: 2 % (ref 0.0–5.0)
HCT: 36.1 % — ABNORMAL LOW (ref 39.0–52.0)
Hemoglobin: 11.6 g/dL — ABNORMAL LOW (ref 13.0–17.0)
LYMPHS ABS: 1.3 10*3/uL (ref 0.7–4.0)
Lymphocytes Relative: 21.4 % (ref 12.0–46.0)
MCHC: 32 g/dL (ref 30.0–36.0)
MCV: 73.3 fl — ABNORMAL LOW (ref 78.0–100.0)
MONO ABS: 0.6 10*3/uL (ref 0.1–1.0)
Monocytes Relative: 10.3 % (ref 3.0–12.0)
NEUTROS ABS: 4 10*3/uL (ref 1.4–7.7)
NEUTROS PCT: 65.9 % (ref 43.0–77.0)
PLATELETS: 227 10*3/uL (ref 150.0–400.0)
RBC: 4.93 Mil/uL (ref 4.22–5.81)
RDW: 16.7 % — ABNORMAL HIGH (ref 11.5–15.5)
WBC: 6.1 10*3/uL (ref 4.0–10.5)

## 2017-12-15 LAB — LIPID PANEL
Cholesterol: 86 mg/dL (ref 0–200)
HDL: 32.3 mg/dL — ABNORMAL LOW (ref >39.00)
LDL Cholesterol: 41 mg/dL (ref 0–99)
NonHDL: 53.49
TRIGLYCERIDES: 64 mg/dL (ref 0.0–149.0)
Total CHOL/HDL Ratio: 3
VLDL: 12.8 mg/dL (ref 0.0–40.0)

## 2017-12-15 LAB — COMPREHENSIVE METABOLIC PANEL
ALT: 15 U/L (ref 0–53)
AST: 14 U/L (ref 0–37)
Albumin: 3.9 g/dL (ref 3.5–5.2)
Alkaline Phosphatase: 124 U/L — ABNORMAL HIGH (ref 39–117)
BUN: 12 mg/dL (ref 6–23)
CO2: 30 meq/L (ref 19–32)
CREATININE: 1.07 mg/dL (ref 0.40–1.50)
Calcium: 9.3 mg/dL (ref 8.4–10.5)
Chloride: 106 mEq/L (ref 96–112)
GFR: 88.77 mL/min (ref >60.00)
GLUCOSE: 131 mg/dL — AB (ref 70–99)
Potassium: 4 mEq/L (ref 3.5–5.1)
SODIUM: 141 meq/L (ref 135–145)
TOTAL PROTEIN: 7.3 g/dL (ref 6.0–8.3)
Total Bilirubin: 0.5 mg/dL (ref 0.2–1.2)

## 2017-12-15 LAB — HEMOGLOBIN A1C: Hgb A1c MFr Bld: 6.7 % — ABNORMAL HIGH (ref 4.6–6.5)

## 2017-12-15 NOTE — Assessment & Plan Note (Signed)
BP well controlled Current regimen effective and well tolerated Continue current medications at current doses cmp  

## 2017-12-15 NOTE — Assessment & Plan Note (Signed)
Has mild anemia Recheck CBC, iron panel

## 2017-12-15 NOTE — Assessment & Plan Note (Signed)
Will refer to sports medicine for further evaluation and treatment

## 2017-12-15 NOTE — Assessment & Plan Note (Signed)
GERD controlled Continue daily medication  

## 2017-12-15 NOTE — Assessment & Plan Note (Signed)
Check lipid panel  Continue daily statin Regular exercise and healthy diet encouraged  

## 2017-12-15 NOTE — Assessment & Plan Note (Signed)
Will refer to sports medicine for further evaluation 

## 2017-12-15 NOTE — Assessment & Plan Note (Signed)
He has not been compliant with a diabetic diet recently and he thinks his A1c may be higher Check A1c Will adjust medication if needed, but he will work on decreasing his sugar intake

## 2017-12-16 ENCOUNTER — Encounter: Payer: Self-pay | Admitting: Internal Medicine

## 2017-12-16 ENCOUNTER — Ambulatory Visit (INDEPENDENT_AMBULATORY_CARE_PROVIDER_SITE_OTHER): Payer: Medicare Other | Admitting: *Deleted

## 2017-12-16 DIAGNOSIS — I428 Other cardiomyopathies: Secondary | ICD-10-CM

## 2017-12-16 DIAGNOSIS — I5022 Chronic systolic (congestive) heart failure: Secondary | ICD-10-CM

## 2017-12-17 NOTE — Progress Notes (Signed)
Remote ICD transmission.   

## 2017-12-31 ENCOUNTER — Encounter: Payer: Self-pay | Admitting: Internal Medicine

## 2018-01-02 ENCOUNTER — Other Ambulatory Visit: Payer: Self-pay | Admitting: Internal Medicine

## 2018-01-04 ENCOUNTER — Ambulatory Visit: Payer: Medicare Other | Admitting: Family Medicine

## 2018-01-05 LAB — CUP PACEART REMOTE DEVICE CHECK
Battery Remaining Longevity: 88 mo
Battery Voltage: 3.01 V
Brady Statistic RV Percent Paced: 0.01 %
Date Time Interrogation Session: 20190808103328
HighPow Impedance: 58 Ohm
Implantable Lead Location: 753860
Implantable Pulse Generator Implant Date: 20140530
Lead Channel Impedance Value: 361 Ohm
Lead Channel Impedance Value: 418 Ohm
Lead Channel Pacing Threshold Pulse Width: 0.4 ms
Lead Channel Sensing Intrinsic Amplitude: 5.75 mV
Lead Channel Setting Pacing Amplitude: 2.5 V
Lead Channel Setting Pacing Pulse Width: 0.4 ms
Lead Channel Setting Sensing Sensitivity: 0.3 mV
MDC IDC LEAD IMPLANT DT: 20140530
MDC IDC MSMT LEADCHNL RV PACING THRESHOLD AMPLITUDE: 0.5 V
MDC IDC MSMT LEADCHNL RV SENSING INTR AMPL: 5.75 mV

## 2018-01-06 ENCOUNTER — Encounter: Payer: Self-pay | Admitting: Family Medicine

## 2018-01-06 ENCOUNTER — Ambulatory Visit (INDEPENDENT_AMBULATORY_CARE_PROVIDER_SITE_OTHER)
Admission: RE | Admit: 2018-01-06 | Discharge: 2018-01-06 | Disposition: A | Discharge status: Home / Self Care | Payer: Medicare Other | Source: Ambulatory Visit | Attending: Family Medicine | Admitting: Family Medicine

## 2018-01-06 ENCOUNTER — Ambulatory Visit: Payer: Medicare Other | Admitting: Family Medicine

## 2018-01-06 VITALS — BP 126/82 | HR 75 | Wt 223.0 lb

## 2018-01-06 DIAGNOSIS — M79671 Pain in right foot: Secondary | ICD-10-CM

## 2018-01-06 DIAGNOSIS — M5416 Radiculopathy, lumbar region: Secondary | ICD-10-CM

## 2018-01-06 DIAGNOSIS — M25552 Pain in left hip: Secondary | ICD-10-CM | POA: Diagnosis not present

## 2018-01-06 NOTE — Assessment & Plan Note (Signed)
Appears to be more of a gluteal tendinitis.  Could be possibly greater trochanteric bursitis.  Home exercise given, topical anti-inflammatories, icing regimen.  Discussed proper shoes at work.  Discussed proper ergonomics throughout the day.  Follow-up again in 4 weeks.  X-rays ordered today to secondary to history of malignant prostate cancer previously.

## 2018-01-06 NOTE — Patient Instructions (Signed)
Good to see you  Ice 20 minutes 2 times daily. Usually after activity and before bed. pennsaid pinkie amount topically 2 times daily as needed.  Good shoes with rigid bottom.  Jalene Mullet, [place] or New balance greater then 700 Also look at [facility] and see what you thinik.  Gave you a note for work to wear what shoes you want.  Stay active Exercises 3 times a week.  Xray downstairs See me again in 4 weeks to make sure all better

## 2018-01-06 NOTE — Progress Notes (Signed)
[name] [address] [place], [place] [address] Phone: [phone] Subjective:   [name], am serving as a scribe for Dr. Hulan Saas.  I'm seeing this patient by the request  of:  Binnie Rail, MD   CC: Hip pain and foot pain  QAS:[id number]  [name] is a 67 y.o. male coming in with complaint of left hip and right foot pain. Patient has been having hip pain over the GT for years. Patient feels like his hip is going to give out. Feels an increase in his pain with barometric pressure changes.   Also complains of right foot pain for a few weeks. 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 No change in shoes. Pain over the top of the foot. Feels like it won't "bend for a few minutes." Pain occurs after being on his feet for a while.       Past Medical History:  Diagnosis Date  . AICD (automatic cardioverter/defibrillator) present   . Anxiety    since defibrillator placement  . Arthritis    knees  . Cataract   . Chronic systolic heart failure ([place])   . Colon polyps    tubular adenoma  . Diabetes mellitus   . Dysrhythmia   . GERD (gastroesophageal reflux disease)   . Hx of myocardial infarction   . Hyperlipidemia   . Hypertension   . NICM (nonischemic cardiomyopathy) ([place])    a.normal cors by cath in 2005. b. s/p Medtronic ICD.  [name] Pacemaker   . Prostate cancer ([place]) 06/2003   s/p prostatectomy  . S/P radiation therapy 10/10/2014 through 11/26/2014    Prostate bed 6600 cGy in 33 sessions   . Sleep apnea   . Ventricular tachycardia ([place])    a. s/p ICD (generator change 09/2012). b. 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 and placed on sotalol   Past Surgical History:  Procedure Laterality Date  . CARDIAC DEFIBRILLATOR PLACEMENT  03/11/2004   Medtronic Maximo single lead  . COLONOSCOPY W/ BIOPSIES AND POLYPECTOMY   08/2004, 02/24/11   35mm adenoma in 2006,  5 mm polyp (not recovered) 2012  . CYSTOSCOPY WITH DIRECT VISION INTERNAL URETHROTOMY N/A 10/08/2015   Procedure: CYSTOSCOPY WITH DIRECT VISION INTERNAL URETHROTOMY;  Surgeon: Nickie Retort, MD;  Location: [facility];  Service: Urology;  Laterality: N/A;  . IMPLANTABLE CARDIOVERTER DEFIBRILLATOR GENERATOR CHANGE N/A 10/07/2012   Procedure: IMPLANTABLE CARDIOVERTER DEFIBRILLATOR GENERATOR CHANGE;  Surgeon: Evans Lance, MD;  Location: [facility] CATH LAB;  Service: Cardiovascular;  Laterality: N/A;  . PROSTATECTOMY  06/2003   Dr Janice Norrie  . RECTAL SURGERY    . UPPER GASTROINTESTINAL ENDOSCOPY  08/2004, 02/24/11   Barrett's esophagus   Social History   Socioeconomic History  . Marital status: Married    Spouse name: Not on file  . Number of children: 2  . Years of education: Not on file  . Highest education level: Not on file  Occupational History  . 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 Financial resource strain: Not on file  . Food insecurity:    Worry: Not on file    Inability: Not on file  . Transportation needs:    Medical: Not on file    Non-medical: Not on file  Tobacco Use  . Smoking status: Never Smoker  . Smokeless tobacco: Never Used  Substance and Sexual  Activity  . Alcohol use: No  . Drug use: No  . Sexual activity: Not on file  Lifestyle  . Physical activity:    Days per week: Not on file    Minutes per session: Not on file  . Stress: Not on file  Relationships  . Social connections:    Talks on phone: Not on file    Gets together: Not on file    Attends religious service: Not on file    Active member of club or organization: Not on file    Attends meetings of clubs or organizations: Not on file    Relationship status: Not on file  Other Topics Concern  . Not on file  Social History Narrative  . Not on file   Allergies  Allergen Reactions  . Zantac [Ranitidine Hcl]     itching   Family  History  Problem Relation Age of Onset  . Coronary artery disease Father   . Prostate cancer Father   . Diabetes Father   . Hypertension Mother   . Diabetes Paternal Grandmother   . Stroke Neg Hx     Current Outpatient Medications (Endocrine & Metabolic):  [name]  JANUMET 50-1000 MG tablet, TAKE 1 TABLET BY MOUTH DAILY WITH SUPPER  Current Outpatient Medications (Cardiovascular):  .  amLODipine (NORVASC) 5 MG tablet, TAKE 1 TABLET BY MOUTH EVERY DAY .  atorvastatin (LIPITOR) 20 MG tablet, TAKE 1 TABLET BY MOUTH DAILY EXCEPT 1/2 TABLET ON TUESDAY THURSDAY AND SATURDAY (MAX ON INS) .  carvedilol (COREG) 12.5 MG tablet, TAKE 1 AND 1/2 TABLETS BY MOUTH TWICE A DAY WITH MEALS (MAX ON INS) .  digoxin (LANOXIN) 0.125 MG tablet, TAKE 1 TABLET BY MOUTH EVERY DAY .  ramipril (ALTACE) 10 MG capsule, TAKE 1 CAPSULE (10 MG TOTAL) BY MOUTH 2 (TWO) TIMES DAILY. .  sotalol (BETAPACE) 120 MG tablet, TAKE 1 TABLET BY MOUTH TWICE A DAY   Current Outpatient Medications (Analgesics):  .  aspirin EC 81 MG tablet, Take 81 mg by mouth daily.  Current Outpatient Medications (Hematological):  .  vitamin B-12 (CYANOCOBALAMIN) 1000 MCG tablet, Take 1,000 mcg by mouth daily.  Current Outpatient Medications (Other):  .  clonazePAM (KLONOPIN) 0.5 MG tablet, Take 0.5 mg by mouth 3 (three) times daily as needed for anxiety.  .  famotidine (PEPCID) 20 MG tablet, TAKE 1 TABLET BY MOUTH TWICE A DAY .  glucose blood (ONE TOUCH ULTRA TEST) test strip, USE TO CHECK BLOOD SUGAR TWICE DAILY .  Lancets (ONETOUCH ULTRASOFT) lancets, Use as instructed to test sugar daily and prn. Dx E11.9 .  omeprazole (PRILOSEC) 20 MG capsule, TAKE 1 CAPSULE BY MOUTH DAILY. RECOMMENDED TO TAKE 30 MIN BEFORE BREAKFAST. *MAX PER INSURANCE    Past medical history, social, surgical and family history all reviewed in electronic medical record.  No pertanent information unless stated regarding to the chief complaint.   Review of Systems:  No  headache, visual changes, nausea, vomiting, diarrhea, constipation, dizziness, abdominal pain, skin rash, fevers, chills, night sweats, weight loss, swollen lymph nodes, body aches, joint swelling, , chest pain, shortness of breath, mood changes.  Positive muscle aches  Objective  Blood pressure 126/82, pulse 75, weight 223 lb (101.2 kg), SpO2 96 %.    General: No apparent distress alert and oriented x3 mood and affect normal, dressed appropriately.  HEENT: Pupils equal, extraocular movements intact  Respiratory: Patient's speak in full sentences and does not appear short of breath  Cardiovascular: No lower  extremity edema, non tender, no erythema  Skin: Warm dry intact with no signs of infection or rash on extremities or on axial skeleton.  Abdomen: Soft nontender  Neuro: Cranial nerves II through XII are intact, neurovascularly intact in all extremities with 2+ DTRs and 2+ pulses.  Lymph: No lymphadenopathy of posterior or anterior cervical chain or axillae bilaterally.  Gait normal with good balance and coordination.  MSK:  Non tender with full range of motion and good stability and symmetric strength and tone of shoulders, elbows, wrist, , knee and ankles bilaterally.  Hip: Left ROM IR: 15 Deg, ER: 25 Deg significant tightness, Flexion: 120 Deg, Extension: 100 Deg, Abduction: 45 Deg, Adduction: 45 Deg Strength IR: 5/5, ER: 5/5, Flexion: 5/5, Extension: 5/5, Abduction: 5/5, Adduction: 5/5 Pelvic alignment unremarkable to inspection and palpation. Standing hip rotation and gait without trendelenburg sign / unsteadiness. Moderate to severe tenderness to palpation over the greater trochanteric area on the left side.  Mild pain over the piriformis No SI joint tenderness and normal minimal SI movement. Contralateral hip unremarkable  Ankle: Right No visible erythema or swelling. Range of motion is full in all directions. Strength is 5/5 in all directions. Stable lateral and medial  ligaments; squeeze test and kleiger test unremarkable; Talar dome minimally tender; No pain at base of 5th MT; No tenderness over cuboid; No tenderness over N spot or navicular prominence No tenderness on posterior aspects of lateral and medial malleolus No sign of peroneal tendon subluxations or tenderness to palpation Negative tarsal tunnel tinel's Able to walk 4 steps. Contralateral ankle unremarkable   [id number]; 15 additional minutes spent for Therapeutic exercises as stated in above notes.  This included exercises focusing on stretching, strengthening, with significant focus on eccentric aspects.   Long term goals include an improvement in range of motion, strength, endurance as well as avoiding reinjury. Patient's frequency would include in 1-2 times a day, 3-5 times a week for a duration of 6-12 weeks. Hip strengthening exercises which included:  Pelvic tilt/bracing to help with proper recruitment of the lower abs and pelvic floor muscles  Glute strengthening to properly contract glutes without over-engaging low back and hamstrings - prone hip extension and glute bridge exercises Proper stretching techniques to increase effectiveness for the hip flexors, groin, quads, piriformic and low back when appropriate    Proper technique shown and discussed handout in great detail with ATC.  All questions were discussed and answered.       Impression and Recommendations:     This case required medical decision making of moderate complexity. The above documentation has been reviewed and is accurate and complete Lyndal Pulley, DO       Note: This dictation was prepared with Dragon dictation along with smaller phrase technology. Any transcriptional errors that result from this process are unintentional.

## 2018-01-06 NOTE — Assessment & Plan Note (Signed)
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  Seems to be more of an anterior tibialis.  Does have near full range of motion with some mild pain over the talar dome.  No significant effusion noted though.  Discussed with patient in great length.  Patient will try to transition to different shoes initially and try some topical anti-inflammatories.  Discussed icing regimen.  Follow-up again in 4 weeks

## 2018-02-01 ENCOUNTER — Other Ambulatory Visit: Payer: Self-pay | Admitting: Internal Medicine

## 2018-02-08 ENCOUNTER — Ambulatory Visit: Payer: Medicare Other | Admitting: Internal Medicine

## 2018-02-08 ENCOUNTER — Encounter: Payer: Self-pay | Admitting: Internal Medicine

## 2018-02-08 VITALS — BP 136/82 | HR 76 | Ht 71.0 in | Wt 225.0 lb

## 2018-02-08 DIAGNOSIS — I5022 Chronic systolic (congestive) heart failure: Secondary | ICD-10-CM | POA: Diagnosis not present

## 2018-02-08 DIAGNOSIS — I10 Essential (primary) hypertension: Secondary | ICD-10-CM | POA: Diagnosis not present

## 2018-02-08 DIAGNOSIS — Z95810 Presence of automatic (implantable) cardiac defibrillator: Secondary | ICD-10-CM

## 2018-02-08 DIAGNOSIS — I472 Ventricular tachycardia, unspecified: Secondary | ICD-10-CM

## 2018-02-08 LAB — CUP PACEART INCLINIC DEVICE CHECK
Battery Remaining Longevity: 85 mo
Battery Voltage: 3.01 V
Brady Statistic RV Percent Paced: 0.01 %
Date Time Interrogation Session: 20191001154842
HIGH POWER IMPEDANCE MEASURED VALUE: 63 Ohm
Implantable Pulse Generator Implant Date: 20140530
Lead Channel Impedance Value: 399 Ohm
Lead Channel Impedance Value: 456 Ohm
Lead Channel Sensing Intrinsic Amplitude: 6.375 mV
Lead Channel Setting Sensing Sensitivity: 0.3 mV
MDC IDC LEAD IMPLANT DT: 20140530
MDC IDC LEAD LOCATION: 753860
MDC IDC MSMT LEADCHNL RV PACING THRESHOLD AMPLITUDE: 0.5 V
MDC IDC MSMT LEADCHNL RV PACING THRESHOLD PULSEWIDTH: 0.4 ms
MDC IDC MSMT LEADCHNL RV SENSING INTR AMPL: 8.125 mV
MDC IDC SET LEADCHNL RV PACING AMPLITUDE: 2.5 V
MDC IDC SET LEADCHNL RV PACING PULSEWIDTH: 0.4 ms

## 2018-02-08 NOTE — Patient Instructions (Signed)
Medication Instructions:  Your physician recommends that you continue on your current medications as directed. Please refer to the Current Medication list given to you today.  Labwork: None ordered.  Testing/Procedures: None ordered.  Follow-Up: Your physician wants you to follow-up in: one year with Dr. Knox Saliva will receive a reminder letter in the mail two months in advance. If you don't receive a letter, please call our office to schedule the follow-up appointment.  Remote monitoring is used to monitor your ICD from home. This monitoring reduces the number of office visits required to check your device to one time per year. It allows [place] to keep an eye on the functioning of your device to ensure it is working properly. You are scheduled for a device check from home on 03/17/2018. You may send your transmission at any time that day. If you have a wireless device, the transmission will be sent automatically. After your physician reviews your transmission, you will receive a postcard with your next transmission date.  Any Other Special Instructions Will Be Listed Below (If Applicable).  If you need a refill on your cardiac medications before your next appointment, please call your pharmacy.

## 2018-02-08 NOTE — Progress Notes (Signed)
HPI Mr. Bifulco returns today for followup. He is a 67 yo man with a long standing non-ischemic CM, s/p ICD implantation, with a h/o VT, HTN, and chronic class 2 systolic heart failure. In the interim, he continues to work full time. He has had no ICD shocks. He denies chest pain or sob or edema. No syncope. He has continued to slowly lose weight, down four lbs from last year. Allergies  Allergen Reactions  . Zantac [Ranitidine Hcl]     itching     Current Outpatient Medications  Medication Sig Dispense Refill  . amLODipine (NORVASC) 5 MG tablet TAKE 1 TABLET BY MOUTH EVERY DAY 90 tablet 1  . aspirin EC 81 MG tablet Take 81 mg by mouth daily.    [name] atorvastatin (LIPITOR) 20 MG tablet TAKE 1 TABLET BY MOUTH DAILY EXCEPT 1/2 TABLET ON TUESDAY THURSDAY AND SATURDAY (MAX ON INS) 90 tablet 1  . carvedilol (COREG) 12.5 MG tablet TAKE 1 AND 1/2 TABLETS BY MOUTH TWICE A DAY WITH MEALS (MAX ON INS) 270 tablet 1  . clonazePAM (KLONOPIN) 0.5 MG tablet Take 0.5 mg by mouth 3 (three) times daily as needed for anxiety.     . digoxin (LANOXIN) 0.125 MG tablet TAKE 1 TABLET BY MOUTH EVERY DAY 90 tablet 0  . famotidine (PEPCID) 20 MG tablet TAKE 1 TABLET BY MOUTH TWICE A DAY 180 tablet 1  . glucose blood (ONE TOUCH ULTRA TEST) test strip USE TO CHECK BLOOD SUGAR TWICE DAILY 100 each 3  . JANUMET 50-1000 MG tablet TAKE 1 TABLET BY MOUTH DAILY WITH SUPPER 90 tablet 1  . Lancets (ONETOUCH ULTRASOFT) lancets Use as instructed to test sugar daily and prn. Dx E11.9 100 each 12  . omeprazole (PRILOSEC) 20 MG capsule TAKE 1 CAPSULE BY MOUTH DAILY. RECOMMENDED TO TAKE 30 MIN BEFORE BREAKFAST. *MAX PER INSURANCE 90 capsule 3  . ramipril (ALTACE) 10 MG capsule TAKE 1 CAPSULE (10 MG TOTAL) BY MOUTH 2 (TWO) TIMES DAILY. 180 capsule 1  . sotalol (BETAPACE) 120 MG tablet TAKE 1 TABLET BY MOUTH TWICE A DAY 180 tablet 1  . vitamin B-12 (CYANOCOBALAMIN) 1000 MCG tablet Take 1,000 mcg by mouth daily.     No current  facility-administered medications for this visit.      Past Medical History:  Diagnosis Date  . AICD (automatic cardioverter/defibrillator) present   . Anxiety    since defibrillator placement  . Arthritis    knees  . Cataract   . Chronic systolic heart failure ([place])   . Colon polyps    tubular adenoma  . Diabetes mellitus   . Dysrhythmia   . GERD (gastroesophageal reflux disease)   . Hx of myocardial infarction   . Hyperlipidemia   . Hypertension   . NICM (nonischemic cardiomyopathy) ([place])    a.normal cors by cath in 2005. b. s/p Medtronic ICD.  [name] Pacemaker   . Prostate cancer ([place]) 06/2003   s/p prostatectomy  . S/P radiation therapy 10/10/2014 through 11/26/2014    Prostate bed 6600 cGy in 33 sessions   . Sleep apnea   . Ventricular tachycardia ([place])    a. s/p ICD (generator change 09/2012). b. 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 and placed on sotalol    ROS:   All systems reviewed and negative except as noted in the HPI.   Past Surgical History:  Procedure Laterality Date  . CARDIAC DEFIBRILLATOR PLACEMENT  03/11/2004   Medtronic Maximo single lead  .  COLONOSCOPY W/ BIOPSIES AND POLYPECTOMY  08/2004, 02/24/11   64mm adenoma in 2006,  5 mm polyp (not recovered) 2012  . CYSTOSCOPY WITH DIRECT VISION INTERNAL URETHROTOMY N/A 10/08/2015   Procedure: CYSTOSCOPY WITH DIRECT VISION INTERNAL URETHROTOMY;  Surgeon: Nickie Retort, MD;  Location: [facility];  Service: Urology;  Laterality: N/A;  . IMPLANTABLE CARDIOVERTER DEFIBRILLATOR GENERATOR CHANGE N/A 10/07/2012   Procedure: IMPLANTABLE CARDIOVERTER DEFIBRILLATOR GENERATOR CHANGE;  Surgeon: Evans Lance, MD;  Location: [facility] CATH LAB;  Service: Cardiovascular;  Laterality: N/A;  . PROSTATECTOMY  06/2003   Dr Janice Norrie  . RECTAL SURGERY    . UPPER GASTROINTESTINAL ENDOSCOPY  08/2004, 02/24/11   Barrett's esophagus     Family History  Problem Relation Age of Onset  .  Coronary artery disease Father   . Prostate cancer Father   . Diabetes Father   . Hypertension Mother   . Diabetes Paternal Grandmother   . Stroke Neg Hx      Social History   Socioeconomic History  . Marital status: Married    Spouse name: Not on file  . Number of children: 2  . Years of education: Not on file  . Highest education level: Not on file  Occupational History  . 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 Financial resource strain: Not on file  . Food insecurity:    Worry: Not on file    Inability: Not on file  . Transportation needs:    Medical: Not on file    Non-medical: Not on file  Tobacco Use  . Smoking status: Never Smoker  . Smokeless tobacco: Never Used  Substance and Sexual Activity  . Alcohol use: No  . Drug use: No  . Sexual activity: Not on file  Lifestyle  . Physical activity:    Days per week: Not on file    Minutes per session: Not on file  . Stress: Not on file  Relationships  . Social connections:    Talks on phone: Not on file    Gets together: Not on file    Attends religious service: Not on file    Active member of club or organization: Not on file    Attends meetings of clubs or organizations: Not on file    Relationship status: Not on file  . Intimate partner violence:    Fear of current or ex partner: Not on file    Emotionally abused: Not on file    Physically abused: Not on file    Forced sexual activity: Not on file  Other Topics Concern  . Not on file  Social History Narrative  . Not on file     BP 136/82   Pulse 76   Ht 5\' 11"  (1.803 m)   Wt 225 lb (102.1 kg)   BMI 31.38 kg/m   Physical Exam:  Well appearing NAD HEENT: Unremarkable Neck:  No JVD, no thyromegally Lymphatics:  No adenopathy Back:  No CVA tenderness Lungs:  Clear with no wheezes HEART:  Regular rate rhythm, no murmurs, no rubs, no clicks Abd:  soft, positive bowel sounds, no organomegally, no rebound, no  guarding Ext:  2 plus pulses, no edema, no cyanosis, no clubbing Skin:  No rashes no nodules Neuro:  CN II through XII intact, motor grossly intact  EKG - NSR with LVH  DEVICE  Normal device function.  See PaceArt for details.   Assess/Plan: 1. ICD - his medtronic device is working  normally. 2. VT - he has had a few non-sustained episodes. He will continue his current meds. 3. Chronic systolic heart failure- his symptoms are class 2. He will continue his current meds. 4. HTN - his blood pressure is fairly well controlled.   Mikle Bosworth.D.

## 2018-02-09 ENCOUNTER — Ambulatory Visit: Payer: Medicare Other | Admitting: Family Medicine

## 2018-02-18 ENCOUNTER — Ambulatory Visit: Payer: Medicare Other | Admitting: Family Medicine

## 2018-02-20 ENCOUNTER — Other Ambulatory Visit: Payer: Self-pay | Admitting: Internal Medicine

## 2018-02-21 ENCOUNTER — Ambulatory Visit: Payer: Medicare Other | Admitting: Family Medicine

## 2018-02-23 ENCOUNTER — Other Ambulatory Visit: Payer: Self-pay | Admitting: Internal Medicine

## 2018-03-17 ENCOUNTER — Ambulatory Visit (INDEPENDENT_AMBULATORY_CARE_PROVIDER_SITE_OTHER): Payer: Medicare Other | Admitting: *Deleted

## 2018-03-17 DIAGNOSIS — I472 Ventricular tachycardia, unspecified: Secondary | ICD-10-CM

## 2018-03-17 NOTE — Progress Notes (Signed)
Remote ICD transmission.   

## 2018-03-18 NOTE — Progress Notes (Deleted)
[name] [address] [name], [place] [address] Phone: [phone] Subjective:    I'm seeing this patient by the request  of:    CC:   [id number]  [name] is a 67 y.o. male coming in with complaint of ***  Onset-  Location Duration-  Character- Aggravating factors- Reliving factors-  Therapies tried-  Severity-     Past Medical History:  Diagnosis Date  . AICD (automatic cardioverter/defibrillator) present   . Anxiety    since defibrillator placement  . Arthritis    knees  . Cataract   . Chronic systolic heart failure ([place])   . Colon polyps    tubular adenoma  . Diabetes mellitus   . Dysrhythmia   . GERD (gastroesophageal reflux disease)   . Hx of myocardial infarction   . Hyperlipidemia   . Hypertension   . NICM (nonischemic cardiomyopathy) ([place])    a.normal cors by cath in 2005. b. s/p Medtronic ICD.  [name] Pacemaker   . Prostate cancer ([place]) 06/2003   s/p prostatectomy  . S/P radiation therapy 10/10/2014 through 11/26/2014    Prostate bed 6600 cGy in 33 sessions   . Sleep apnea   . Ventricular tachycardia ([place])    a. s/p ICD (generator change 09/2012). b. 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 and placed on sotalol   Past Surgical History:  Procedure Laterality Date  . CARDIAC DEFIBRILLATOR PLACEMENT  03/11/2004   Medtronic Maximo single lead  . COLONOSCOPY W/ BIOPSIES AND POLYPECTOMY  08/2004, 02/24/11   79mm adenoma in 2006,  5 mm polyp (not recovered) 2012  . CYSTOSCOPY WITH DIRECT VISION INTERNAL URETHROTOMY N/A 10/08/2015   Procedure: CYSTOSCOPY WITH DIRECT VISION INTERNAL URETHROTOMY;  Surgeon: Nickie Retort, MD;  [facility];  Service: Urology;  Laterality: N/A;  . IMPLANTABLE CARDIOVERTER DEFIBRILLATOR GENERATOR CHANGE N/A 10/07/2012   Procedure: IMPLANTABLE CARDIOVERTER DEFIBRILLATOR GENERATOR CHANGE;  Surgeon: Evans Lance, MD;  Location: [facility] CATH  LAB;  Service: Cardiovascular;  Laterality: N/A;  . PROSTATECTOMY  06/2003   Dr Janice Norrie  . RECTAL SURGERY    . UPPER GASTROINTESTINAL ENDOSCOPY  08/2004, 02/24/11   Barrett's esophagus   Social History   Socioeconomic History  . Marital status: Married    Spouse name: Not on file  . Number of children: 2  . Years of education: Not on file  . Highest education level: Not on file  Occupational History  . 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 Financial resource strain: Not on file  . Food insecurity:    Worry: Not on file    Inability: Not on file  . Transportation needs:    Medical: Not on file    Non-medical: Not on file  Tobacco Use  . Smoking status: Never Smoker  . Smokeless tobacco: Never Used  Substance and Sexual Activity  . Alcohol use: No  . Drug use: No  . Sexual activity: Not on file  Lifestyle  . Physical activity:    Days per week: Not on file    Minutes per session: Not on file  . Stress: Not on file  Relationships  . Social connections:    Talks on phone: Not on file    Gets together: Not on file    Attends religious service: Not on file    Active member of club or organization: Not on file    Attends meetings of clubs or organizations: Not on file  Relationship status: Not on file  Other Topics Concern  . Not on file  Social History Narrative  . Not on file   Allergies  Allergen Reactions  . Zantac [Ranitidine Hcl]     itching   Family History  Problem Relation Age of Onset  . Coronary artery disease Father   . Prostate cancer Father   . Diabetes Father   . Hypertension Mother   . Diabetes Paternal Grandmother   . Stroke Neg Hx     Current Outpatient Medications (Endocrine & Metabolic):  [name]  JANUMET 50-1000 MG tablet, TAKE 1 TABLET BY MOUTH DAILY WITH SUPPER  Current Outpatient Medications (Cardiovascular):  .  amLODipine (NORVASC) 5 MG tablet, TAKE 1 TABLET BY MOUTH EVERY DAY .  atorvastatin (LIPITOR) 20 MG  tablet, TAKE 1 TABLET BY MOUTH DAILY EXCEPT 1/2 TABLET ON TUESDAY THURSDAY AND SATURDAY (MAX ON INS) .  carvedilol (COREG) 12.5 MG tablet, TAKE 1 AND 1/2 TABLETS BY MOUTH TWICE A DAY WITH MEALS (MAX ON INS) .  digoxin (LANOXIN) 0.125 MG tablet, TAKE 1 TABLET BY MOUTH EVERY DAY .  ramipril (ALTACE) 10 MG capsule, TAKE 1 CAPSULE (10 MG TOTAL) BY MOUTH 2 (TWO) TIMES DAILY. .  sotalol (BETAPACE) 120 MG tablet, TAKE 1 TABLET BY MOUTH TWICE A DAY   Current Outpatient Medications (Analgesics):  .  aspirin EC 81 MG tablet, Take 81 mg by mouth daily.  Current Outpatient Medications (Hematological):  .  vitamin B-12 (CYANOCOBALAMIN) 1000 MCG tablet, Take 1,000 mcg by mouth daily.  Current Outpatient Medications (Other):  .  clonazePAM (KLONOPIN) 0.5 MG tablet, Take 0.5 mg by mouth 3 (three) times daily as needed for anxiety.  .  famotidine (PEPCID) 20 MG tablet, TAKE 1 TABLET BY MOUTH TWICE A DAY .  glucose blood (ONE TOUCH ULTRA TEST) test strip, USE TO CHECK BLOOD SUGAR TWICE DAILY .  Lancets (ONETOUCH ULTRASOFT) lancets, Use as instructed to test sugar daily and prn. Dx E11.9 .  omeprazole (PRILOSEC) 20 MG capsule, TAKE 1 CAPSULE BY MOUTH DAILY. RECOMMENDED TO TAKE 30 MIN BEFORE BREAKFAST. *MAX PER INSURANCE    Past medical history, social, surgical and family history all reviewed in electronic medical record.  No pertanent information unless stated regarding to the chief complaint.   Review of Systems:  No headache, visual changes, nausea, vomiting, diarrhea, constipation, dizziness, abdominal pain, skin rash, fevers, chills, night sweats, weight loss, swollen lymph nodes, body aches, joint swelling, muscle aches, chest pain, shortness of breath, mood changes.   Objective  There were no vitals taken for this visit. Systems examined below as of    General: No apparent distress alert and oriented x3 mood and affect normal, dressed appropriately.  HEENT: Pupils equal, extraocular movements  intact  Respiratory: Patient's speak in full sentences and does not appear short of breath  Cardiovascular: No lower extremity edema, non tender, no erythema  Skin: Warm dry intact with no signs of infection or rash on extremities or on axial skeleton.  Abdomen: Soft nontender  Neuro: Cranial nerves II through XII are intact, neurovascularly intact in all extremities with 2+ DTRs and 2+ pulses.  Lymph: No lymphadenopathy of posterior or anterior cervical chain or axillae bilaterally.  Gait normal with good balance and coordination.  MSK:  Non tender with full range of motion and good stability and symmetric strength and tone of shoulders, elbows, wrist, hip, knee and ankles bilaterally.     Impression and Recommendations:     This case  required medical decision making of moderate complexity. The above documentation has been reviewed and is accurate and complete Lyndal Pulley, DO       Note: This dictation was prepared with Dragon dictation along with smaller phrase technology. Any transcriptional errors that result from this process are unintentional.

## 2018-03-20 ENCOUNTER — Encounter: Payer: Self-pay | Admitting: Cardiology

## 2018-03-21 ENCOUNTER — Ambulatory Visit: Payer: Medicare Other | Admitting: Family Medicine

## 2018-03-21 ENCOUNTER — Other Ambulatory Visit: Payer: Self-pay | Admitting: Internal Medicine

## 2018-03-22 ENCOUNTER — Other Ambulatory Visit: Payer: Self-pay | Admitting: Internal Medicine

## 2018-05-20 LAB — CUP PACEART REMOTE DEVICE CHECK
Battery Voltage: 3.01 V
Brady Statistic RV Percent Paced: 0.01 %
Date Time Interrogation Session: 20191107143432
HighPow Impedance: 65 Ohm
Implantable Lead Location: 753860
Implantable Lead Model: 6935
Lead Channel Impedance Value: 399 Ohm
Lead Channel Impedance Value: 475 Ohm
Lead Channel Pacing Threshold Pulse Width: 0.4 ms
Lead Channel Setting Pacing Amplitude: 2.5 V
MDC IDC LEAD IMPLANT DT: 20140530
MDC IDC MSMT BATTERY REMAINING LONGEVITY: 84 mo
MDC IDC MSMT LEADCHNL RV PACING THRESHOLD AMPLITUDE: 0.375 V
MDC IDC MSMT LEADCHNL RV SENSING INTR AMPL: 6.75 mV
MDC IDC MSMT LEADCHNL RV SENSING INTR AMPL: 6.75 mV
MDC IDC PG IMPLANT DT: 20140530
MDC IDC SET LEADCHNL RV PACING PULSEWIDTH: 0.4 ms
MDC IDC SET LEADCHNL RV SENSING SENSITIVITY: 0.3 mV

## 2018-06-05 ENCOUNTER — Other Ambulatory Visit: Payer: Self-pay | Admitting: Internal Medicine

## 2018-06-09 ENCOUNTER — Encounter: Payer: Self-pay | Admitting: Internal Medicine

## 2018-06-16 ENCOUNTER — Ambulatory Visit (INDEPENDENT_AMBULATORY_CARE_PROVIDER_SITE_OTHER): Payer: Medicare Other

## 2018-06-16 DIAGNOSIS — I5022 Chronic systolic (congestive) heart failure: Secondary | ICD-10-CM

## 2018-06-16 DIAGNOSIS — I428 Other cardiomyopathies: Secondary | ICD-10-CM

## 2018-06-16 NOTE — Patient Instructions (Signed)
  Tests ordered today. Your results will be released to [place] (or called to you) after review, usually within 72hours after test completion. If any changes need to be made, you will be notified at that same time.  All other Health Maintenance issues reviewed.   All recommended immunizations and age-appropriate screenings are up-to-date or discussed.  No immunizations administered today.   Medications reviewed and updated.  Changes include :     Your prescription(s) have been submitted to your pharmacy. Please take as directed and contact our office if you believe you are having problem(s) with the medication(s).  A referral was ordered for   Please followup in 6 months

## 2018-06-16 NOTE — Progress Notes (Signed)
Subjective:    Patient ID: [name], male    DOB: 1951-04-16, 68 y.o.   MRN: [id number]  HPI The patient is here for follow up.  Medications and allergies reviewed with patient and updated if appropriate.  Patient Active Problem List   Diagnosis Date Noted  . Right foot pain 12/15/2017  . Left hip pain 12/14/2016  . Left knee pain 12/14/2016  . Shingles 07/28/2016  . GERD (gastroesophageal reflux disease) 07/29/2015  . Malignant neoplasm of prostate ([place]) 09/27/2014  . RLS (restless legs syndrome) 11/29/2012  . Obstructive sleep apnea 11/10/2012  . Elevated alkaline phosphatase level 10/04/2012  . Vitamin B 12 deficiency 03/06/2011  . Chronic systolic heart failure ([place]) 01/27/2011  . Mild anemia 01/20/2011  . Ventricular tachycardia ([place])   . NICM (nonischemic cardiomyopathy) ([place])   . Diabetes ([place]) 08/26/2009  . Hyperlipidemia 08/26/2009  . Essential hypertension 08/26/2009  . PROSTATE CANCER, HX OF 08/26/2009  . 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  . Personal history of adenomatous colonic polyps 09/05/2004  . Barrett's esophagus 09/05/2004    Current Outpatient Medications on File Prior to Visit  Medication Sig Dispense Refill  . amLODipine (NORVASC) 5 MG tablet TAKE 1 TABLET BY MOUTH EVERY DAY 90 tablet 1  . aspirin EC 81 MG tablet Take 81 mg by mouth daily.    [name] atorvastatin (LIPITOR) 20 MG tablet TAKE 1 TABLET BY MOUTH DAILY EXCEPT 1/2 TABLET ON TUESDAY THURSDAY AND SATURDAY (MAX ON INS) 90 tablet 1  . carvedilol (COREG) 12.5 MG tablet TAKE 1 AND 1/2 TABLETS BY MOUTH TWICE A DAY WITH MEALS (MAX ON INS) 270 tablet 1  . clonazePAM (KLONOPIN) 0.5 MG tablet Take 0.5 mg by mouth 3 (three) times daily as needed for anxiety.     . digoxin (LANOXIN) 0.125 MG tablet TAKE 1 TABLET BY MOUTH EVERY DAY 90 tablet 1  . famotidine (PEPCID) 20 MG tablet TAKE 1 TABLET BY MOUTH TWICE A DAY 180 tablet 1  . glucose blood (ONE TOUCH ULTRA TEST)  test strip USE TO CHECK BLOOD SUGAR TWICE DAILY 100 each 3  . JANUMET 50-1000 MG tablet TAKE 1 TABLET BY MOUTH DAILY WITH SUPPER 90 tablet 1  . Lancets (ONETOUCH ULTRASOFT) lancets Use as instructed to test sugar daily and prn. Dx E11.9 100 each 12  . omeprazole (PRILOSEC) 20 MG capsule TAKE 1 CAPSULE BY MOUTH DAILY. RECOMMENDED TO TAKE 30 MIN BEFORE BREAKFAST. *MAX PER INSURANCE 90 capsule 3  . ramipril (ALTACE) 10 MG capsule TAKE 1 CAPSULE (10 MG TOTAL) BY MOUTH 2 (TWO) TIMES DAILY. 180 capsule 1  . sotalol (BETAPACE) 120 MG tablet TAKE 1 TABLET BY MOUTH TWICE A DAY 180 tablet 1  . vitamin B-12 (CYANOCOBALAMIN) 1000 MCG tablet Take 1,000 mcg by mouth daily.     No current facility-administered medications on file prior to visit.     Past Medical History:  Diagnosis Date  . AICD (automatic cardioverter/defibrillator) present   . Anxiety    since defibrillator placement  . Arthritis    knees  . Cataract   . Chronic systolic heart failure ([place])   . Colon polyps    tubular adenoma  . Diabetes mellitus   . Dysrhythmia   . GERD (gastroesophageal reflux disease)   . Hx of myocardial infarction   . Hyperlipidemia   . Hypertension   . NICM (nonischemic cardiomyopathy) ([place])    a.normal cors by cath in 2005. b. s/p Medtronic ICD.  [name]  Pacemaker   . Prostate cancer ([place]) 06/2003   s/p prostatectomy  . S/P radiation therapy 10/10/2014 through 11/26/2014    Prostate bed 6600 cGy in 33 sessions   . Sleep apnea   . Ventricular tachycardia ([place])    a. s/p ICD (generator change 09/2012). b. 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 and placed on sotalol    Past Surgical History:  Procedure Laterality Date  . CARDIAC DEFIBRILLATOR PLACEMENT  03/11/2004   Medtronic Maximo single lead  . COLONOSCOPY W/ BIOPSIES AND POLYPECTOMY  08/2004, 02/24/11   4mm adenoma in 2006,  5 mm polyp (not recovered) 2012  . CYSTOSCOPY WITH DIRECT VISION INTERNAL  URETHROTOMY N/A 10/08/2015   Procedure: CYSTOSCOPY WITH DIRECT VISION INTERNAL URETHROTOMY;  Surgeon: Nickie Retort, MD;  Location: [facility];  Service: Urology;  Laterality: N/A;  . IMPLANTABLE CARDIOVERTER DEFIBRILLATOR GENERATOR CHANGE N/A 10/07/2012   Procedure: IMPLANTABLE CARDIOVERTER DEFIBRILLATOR GENERATOR CHANGE;  Surgeon: Evans Lance, MD;  Location: [facility] CATH LAB;  Service: Cardiovascular;  Laterality: N/A;  . PROSTATECTOMY  06/2003   Dr Janice Norrie  . RECTAL SURGERY    . UPPER GASTROINTESTINAL ENDOSCOPY  08/2004, 02/24/11   Barrett's esophagus    Social History   Socioeconomic History  . Marital status: Married    Spouse name: Not on file  . Number of children: 2  . Years of education: Not on file  . Highest education level: Not on file  Occupational History  . 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 Financial resource strain: Not on file  . Food insecurity:    Worry: Not on file    Inability: Not on file  . Transportation needs:    Medical: Not on file    Non-medical: Not on file  Tobacco Use  . Smoking status: Never Smoker  . Smokeless tobacco: Never Used  Substance and Sexual Activity  . Alcohol use: No  . Drug use: No  . Sexual activity: Not on file  Lifestyle  . Physical activity:    Days per week: Not on file    Minutes per session: Not on file  . Stress: Not on file  Relationships  . Social connections:    Talks on phone: Not on file    Gets together: Not on file    Attends religious service: Not on file    Active member of club or organization: Not on file    Attends meetings of clubs or organizations: Not on file    Relationship status: Not on file  Other Topics Concern  . Not on file  Social History Narrative  . Not on file    Family History  Problem Relation Age of Onset  . Coronary artery disease Father   . Prostate cancer Father   . Diabetes Father   . Hypertension Mother   . Diabetes Paternal Grandmother   .  Stroke Neg Hx     Review of Systems     Objective:  There were no vitals filed for this visit. BP Readings from Last 3 Encounters:  02/08/18 136/82  01/06/18 126/82  12/15/17 138/74   Wt Readings from Last 3 Encounters:  02/08/18 225 lb (102.1 kg)  01/06/18 223 lb (101.2 kg)  12/15/17 224 lb (101.6 kg)   There is no height or weight on file to calculate BMI.   Physical Exam         Assessment & Plan:    See Problem List for Assessment and  Plan of chronic medical problems.   This encounter was created in error - please disregard.

## 2018-06-17 ENCOUNTER — Encounter: Payer: Medicare Other | Admitting: Internal Medicine

## 2018-06-17 LAB — CUP PACEART REMOTE DEVICE CHECK
Battery Voltage: 3.01 V
Brady Statistic RV Percent Paced: 0.01 %
Date Time Interrogation Session: 20200206081604
HighPow Impedance: 67 Ohm
Implantable Lead Location: 753860
Implantable Lead Model: 6935
Implantable Pulse Generator Implant Date: 20140530
Lead Channel Impedance Value: 456 Ohm
Lead Channel Setting Pacing Amplitude: 2.5 V
MDC IDC LEAD IMPLANT DT: 20140530
MDC IDC MSMT BATTERY REMAINING LONGEVITY: 81 mo
MDC IDC MSMT LEADCHNL RV IMPEDANCE VALUE: 399 Ohm
MDC IDC MSMT LEADCHNL RV PACING THRESHOLD AMPLITUDE: 0.5 V
MDC IDC MSMT LEADCHNL RV PACING THRESHOLD PULSEWIDTH: 0.4 ms
MDC IDC MSMT LEADCHNL RV SENSING INTR AMPL: 7.375 mV
MDC IDC MSMT LEADCHNL RV SENSING INTR AMPL: 7.375 mV
MDC IDC SET LEADCHNL RV PACING PULSEWIDTH: 0.4 ms
MDC IDC SET LEADCHNL RV SENSING SENSITIVITY: 0.3 mV

## 2018-06-23 ENCOUNTER — Telehealth: Payer: Self-pay | Admitting: Internal Medicine

## 2018-06-23 NOTE — Telephone Encounter (Signed)
Pt aware to follow up with cardiology.

## 2018-06-23 NOTE — Telephone Encounter (Signed)
It looks like pt has been on both medications for a while. I spoke to pt and he said he has been taking these medications together for years. I did advise that he follow up with cardiology as well about it. Should he be taking both or is that something he just needs to address with them?

## 2018-06-23 NOTE — Telephone Encounter (Signed)
Copied from [place] [id number]. Topic: General - Other >> Jun 23, 2018 12:24 PM Percell Belt A wrote: Reason for CRM:   [facility] on [address] called in and wanted to know if pt should be taking carvedilol (COREG) 12.5 MG tablet [[id number]] and sotalol (BETAPACE) 120 MG tablet [[id number]] . She stated this was unusal for pt to be on both at the same time?  Please advise   Best number [phone]

## 2018-06-23 NOTE — Telephone Encounter (Signed)
Yes on both - following with cardiology

## 2018-06-28 NOTE — Progress Notes (Signed)
Remote ICD transmission.   

## 2018-06-29 ENCOUNTER — Telehealth: Payer: Self-pay | Admitting: Internal Medicine

## 2018-06-29 ENCOUNTER — Other Ambulatory Visit: Payer: Self-pay | Admitting: Urology

## 2018-06-29 NOTE — Telephone Encounter (Signed)
New Message      [facility] Pre-operative Risk Assessment    Request for surgical clearance:  1. What type of surgery is being performed? Urethrotomy   2. When is this surgery scheduled? TBD pending clearance   3. What type of clearance is required (medical clearance vs. Pharmacy clearance to hold med vs. Both)? Medical   4. Are there any medications that need to be held prior to surgery and how long? No   5. Practice name and name of physician performing surgery? Dr Louis Meckel at [facility]   6. What is your office phone [phone]   7.   What is your office fax number [phone]  8.   Anesthesia type (None, local, MAC, general) ? General    [name] 06/29/2018, 3:01 PM  _________________________________________________________________   (provider comments below)

## 2018-06-29 NOTE — Telephone Encounter (Signed)
   Primary Cardiologist: Cristopher Peru, MD  Chart reviewed as part of pre-operative protocol coverage. Left voice mail to call back.  [place], [place] 06/29/2018, 3:25 PM

## 2018-07-02 ENCOUNTER — Other Ambulatory Visit: Payer: Self-pay | Admitting: Internal Medicine

## 2018-07-04 ENCOUNTER — Telehealth: Payer: Self-pay | Admitting: Cardiology

## 2018-07-04 NOTE — Telephone Encounter (Signed)
Pt has been scheduled to see Dr Lovena Le tomorrow, 07/05/2018 @ 11:45. Will route to requesting surgeons office to make them aware.

## 2018-07-04 NOTE — Telephone Encounter (Signed)
I called and spoke to the patient to assess his status for clearance. Pt has ICD and denies any chest pain, shortness of breath, lightheadedness, dizziness or syncope. He has not had any ICD shocks but he is concerned about having an occ "blink", like the lights go off. This occurs 2-3 times per week. His eye doctor told him it could be a circulation problem and that he should see Dr. Lovena Le.  He would like to See Dr. Lovena Le prior to his procedure.   Pre-op covering staff: - Please schedule appointment and call patient to inform them. - Please contact requesting surgeon's office via preferred method (i.e, phone, fax) to inform them of need for appointment prior to surgery.  If applicable, this message will also be routed to pharmacy pool and/or primary cardiologist for input on holding anticoagulant/antiplatelet agent as requested below so that this information is available at time of patient's appointment.   Daune Perch, NP  07/04/2018, 10:35 AM

## 2018-07-04 NOTE — Telephone Encounter (Signed)
Informed pt that we did received his remote transmission 06/16/2018. Informed him of the results.

## 2018-07-05 ENCOUNTER — Encounter: Payer: Self-pay | Admitting: Internal Medicine

## 2018-07-05 ENCOUNTER — Ambulatory Visit: Payer: Medicare Other | Admitting: Internal Medicine

## 2018-07-05 VITALS — BP 138/78 | HR 70 | Ht 71.0 in | Wt 229.6 lb

## 2018-07-05 DIAGNOSIS — I5022 Chronic systolic (congestive) heart failure: Secondary | ICD-10-CM

## 2018-07-05 DIAGNOSIS — I472 Ventricular tachycardia, unspecified: Secondary | ICD-10-CM

## 2018-07-05 DIAGNOSIS — I10 Essential (primary) hypertension: Secondary | ICD-10-CM

## 2018-07-05 DIAGNOSIS — Z95810 Presence of automatic (implantable) cardiac defibrillator: Secondary | ICD-10-CM | POA: Diagnosis not present

## 2018-07-05 NOTE — Progress Notes (Signed)
HPI [name] returns today for preoperative evaluation. He is a pleasant 68 yo man with chronic systolic heart failure, s/p ICD insertion, h/o VT, and HTN. He is pending cystoscopy. He feels well. He has class 2 CHF symptoms. No ICD shocks. Allergies  Allergen Reactions  . Zantac [Ranitidine Hcl]     itching     Current Outpatient Medications  Medication Sig Dispense Refill  . amLODipine (NORVASC) 5 MG tablet TAKE 1 TABLET BY MOUTH EVERY DAY 90 tablet 1  . aspirin EC 81 MG tablet Take 81 mg by mouth daily.    [name] atorvastatin (LIPITOR) 20 MG tablet TAKE 1 TABLET BY MOUTH DAILY EXCEPT 1/2 TABLET ON TUESDAY THURSDAY AND SATURDAY (MAX ON INS) 90 tablet 1  . carvedilol (COREG) 12.5 MG tablet TAKE 1 AND 1/2 TABLETS BY MOUTH TWICE A DAY WITH MEALS (MAX ON INS) 270 tablet 1  . clonazePAM (KLONOPIN) 0.5 MG tablet Take 0.5 mg by mouth 3 (three) times daily as needed for anxiety.     . digoxin (LANOXIN) 0.125 MG tablet TAKE 1 TABLET BY MOUTH EVERY DAY 90 tablet 1  . famotidine (PEPCID) 20 MG tablet TAKE 1 TABLET BY MOUTH TWICE A DAY 180 tablet 1  . glucose blood (ONE TOUCH ULTRA TEST) test strip USE TO CHECK BLOOD SUGAR TWICE DAILY 100 each 3  . JANUMET 50-1000 MG tablet TAKE 1 TABLET BY MOUTH DAILY WITH SUPPER 90 tablet 1  . Lancets (ONETOUCH ULTRASOFT) lancets Use as instructed to test sugar daily and prn. Dx E11.9 100 each 12  . omeprazole (PRILOSEC) 20 MG capsule TAKE 1 CAPSULE BY MOUTH DAILY. RECOMMENDED TO TAKE 30 MIN BEFORE BREAKFAST. *MAX PER INSURANCE 90 capsule 3  . ramipril (ALTACE) 10 MG capsule TAKE 1 CAPSULE (10 MG TOTAL) BY MOUTH 2 (TWO) TIMES DAILY. 180 capsule 1  . sotalol (BETAPACE) 120 MG tablet TAKE 1 TABLET BY MOUTH TWICE A DAY 180 tablet 1  . vitamin B-12 (CYANOCOBALAMIN) 1000 MCG tablet Take 1,000 mcg by mouth daily.     No current facility-administered medications for this visit.      Past Medical History:  Diagnosis Date  . AICD (automatic  cardioverter/defibrillator) present   . Anxiety    since defibrillator placement  . Arthritis    knees  . Cataract   . Chronic systolic heart failure ([place])   . Colon polyps    tubular adenoma  . Diabetes mellitus   . Dysrhythmia   . GERD (gastroesophageal reflux disease)   . Hx of myocardial infarction   . Hyperlipidemia   . Hypertension   . NICM (nonischemic cardiomyopathy) ([place])    a.normal cors by cath in 2005. b. s/p Medtronic ICD.  [name] Pacemaker   . Prostate cancer ([place]) 06/2003   s/p prostatectomy  . S/P radiation therapy 10/10/2014 through 11/26/2014    Prostate bed 6600 cGy in 33 sessions   . Sleep apnea   . Ventricular tachycardia ([place])    a. s/p ICD (generator change 09/2012). b. 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 and placed on sotalol    ROS:   All systems reviewed and negative except as noted in the HPI.   Past Surgical History:  Procedure Laterality Date  . CARDIAC DEFIBRILLATOR PLACEMENT  03/11/2004   Medtronic Maximo single lead  . COLONOSCOPY W/ BIOPSIES AND POLYPECTOMY  08/2004, 02/24/11   52mm adenoma in 2006,  5 mm polyp (not recovered) 2012  . CYSTOSCOPY WITH DIRECT VISION INTERNAL URETHROTOMY  N/A 10/08/2015   Procedure: CYSTOSCOPY WITH DIRECT VISION INTERNAL URETHROTOMY;  Surgeon: Nickie Retort, MD;  Location: [facility];  Service: Urology;  Laterality: N/A;  . IMPLANTABLE CARDIOVERTER DEFIBRILLATOR GENERATOR CHANGE N/A 10/07/2012   Procedure: IMPLANTABLE CARDIOVERTER DEFIBRILLATOR GENERATOR CHANGE;  Surgeon: Evans Lance, MD;  Location: [facility] CATH LAB;  Service: Cardiovascular;  Laterality: N/A;  . PROSTATECTOMY  06/2003   Dr Janice Norrie  . RECTAL SURGERY    . UPPER GASTROINTESTINAL ENDOSCOPY  08/2004, 02/24/11   Barrett's esophagus     Family History  Problem Relation Age of Onset  . Coronary artery disease Father   . Prostate cancer Father   . Diabetes Father   . Hypertension Mother   . Diabetes  Paternal Grandmother   . Stroke Neg Hx      Social History   Socioeconomic History  . Marital status: Married    Spouse name: Not on file  . Number of children: 2  . Years of education: Not on file  . Highest education level: Not on file  Occupational History  . Occupation: Therapist, sports: [employer]  . Financial resource strain: Not on file  . Food insecurity:    Worry: Not on file    Inability: Not on file  . Transportation needs:    Medical: Not on file    Non-medical: Not on file  Tobacco Use  . Smoking status: Never Smoker  . Smokeless tobacco: Never Used  Substance and Sexual Activity  . Alcohol use: No  . Drug use: No  . Sexual activity: Not on file  Lifestyle  . Physical activity:    Days per week: Not on file    Minutes per session: Not on file  . Stress: Not on file  Relationships  . Social connections:    Talks on phone: Not on file    Gets together: Not on file    Attends religious service: Not on file    Active member of club or organization: Not on file    Attends meetings of clubs or organizations: Not on file    Relationship status: Not on file  . Intimate partner violence:    Fear of current or ex partner: Not on file    Emotionally abused: Not on file    Physically abused: Not on file    Forced sexual activity: Not on file  Other Topics Concern  . Not on file  Social History Narrative  . Not on file     BP 138/78   Pulse 70   Ht 5\' 11"  (1.803 m)   Wt 229 lb 9.6 oz (104.1 kg)   SpO2 98%   BMI 32.02 kg/m   Physical Exam:  Well appearing NAD HEENT: Unremarkable Neck:  6 cm JVD, no thyromegally Lymphatics:  No adenopathy Back:  No CVA tenderness Lungs:  Clear with no wheezes HEART:  Regular rate rhythm, no murmurs, no rubs, no clicks Abd:  soft, positive bowel sounds, no organomegally, no rebound, no guarding Ext:  2 plus pulses, no edema, no cyanosis, no clubbing Skin:  No rashes no nodules Neuro:  CN  II through XII intact, motor grossly intact  EKG - nsr with old inferior MI.  DEVICE  Normal device function.  See PaceArt for details.   Assess/Plan: 1. Preoperative evaluation - he is low risk for upcoming cystoscopy. He may proceed. 2. ICD - his Medtronic single chamber ICD is working normally.  3. Chronic  systolic heart failure - his symptoms are class 2. He will continue his current meds. No change. He is encouraged to maintain a low sodium diet.  Mikle Bosworth.D.

## 2018-07-05 NOTE — Patient Instructions (Addendum)
Medication Instructions:  Your physician recommends that you continue on your current medications as directed. Please refer to the Current Medication list given to you today.  Labwork: None ordered.  Testing/Procedures: None ordered.  Follow-Up: Your physician wants you to follow-up in: current recall time (September 2020).   You will receive a reminder letter in the mail two months in advance. If you don't receive a letter, please call our office to schedule the follow-up appointment.  Remote monitoring is used to monitor your ICD from home. This monitoring reduces the number of office visits required to check your device to one time per year. It allows [place] to keep an eye on the functioning of your device to ensure it is working properly. You are scheduled for a device check from home on 09/22/2018. You may send your transmission at any time that day. If you have a wireless device, the transmission will be sent automatically. After your physician reviews your transmission, you will receive a postcard with your next transmission date.  Any Other Special Instructions Will Be Listed Below (If Applicable).  If you need a refill on your cardiac medications before your next appointment, please call your pharmacy.

## 2018-07-06 LAB — CUP PACEART INCLINIC DEVICE CHECK
Battery Remaining Longevity: 80 mo
Battery Voltage: 3.01 V
Brady Statistic RV Percent Paced: 0.01 %
Date Time Interrogation Session: 20200225183232
HIGH POWER IMPEDANCE MEASURED VALUE: 62 Ohm
Implantable Lead Model: 6935
Implantable Pulse Generator Implant Date: 20140530
Lead Channel Impedance Value: 399 Ohm
Lead Channel Impedance Value: 456 Ohm
Lead Channel Pacing Threshold Amplitude: 0.5 V
Lead Channel Pacing Threshold Pulse Width: 0.4 ms
Lead Channel Sensing Intrinsic Amplitude: 6.875 mV
Lead Channel Sensing Intrinsic Amplitude: 7.25 mV
Lead Channel Setting Pacing Amplitude: 2.5 V
Lead Channel Setting Sensing Sensitivity: 0.3 mV
MDC IDC LEAD IMPLANT DT: 20140530
MDC IDC LEAD LOCATION: 753860
MDC IDC SET LEADCHNL RV PACING PULSEWIDTH: 0.4 ms

## 2018-07-07 NOTE — Patient Instructions (Addendum)
  Tests ordered today. Your results will be released to MyChart (or called to you) after review, usually within 72hours after test completion. If any changes need to be made, you will be notified at that same time.  Medications reviewed and updated.  Changes include :   none      Please followup in 6 months   

## 2018-07-07 NOTE — Progress Notes (Signed)
Subjective:    Patient ID: [name], male    DOB: 1950-06-16, 68 y.o.   MRN: [id number]  HPI The patient is here for follow up.  HFrEF, Hypertension: He is taking his medication daily. He is compliant with a low sodium diet.  He denies chest pain, palpitations, edema, shortness of breath and regular headaches. He is very active, but not exercising regularly.  He does not monitor his blood pressure at home.    Diabetes: He is taking his medication daily as prescribed. He is compliant with a diabetic diet. He is very active.  He checks his feet daily and denies foot lesions. He is up-to-date with an ophthalmology examination.   Hyperlipidemia: He is taking his medication daily. He is compliant with a low fat/cholesterol diet. He is very active at work, which is his exercise, but not exercising regularly outside of work. He denies myalgias.   GERD:  He is taking his medication daily as prescribed.  He denies any GERD symptoms and feels his GERD is well controlled.   Anemia: He is chronic anemia.  He denies any blood in the stool, black stool or blood in the urine.  He denies any known family history of genetic cause of the anemia.  Muscle tightening and cramping:  It is usually in the calves.  Occurs during the day and at night.  He is good with drinking water.  He has some left lower back pain with sitting. He is still working and is active at work.  Probably worse with activity and better with rest.   Medications and allergies reviewed with patient and updated if appropriate.  Patient Active Problem List   Diagnosis Date Noted  . Muscle cramping 07/08/2018  . Right foot pain 12/15/2017  . Left hip pain 12/14/2016  . Left knee pain 12/14/2016  . Shingles 07/28/2016  . GERD (gastroesophageal reflux disease) 07/29/2015  . Malignant neoplasm of prostate ([place]) 09/27/2014  . RLS (restless legs syndrome) 11/29/2012  . Obstructive sleep apnea 11/10/2012  . Elevated alkaline  phosphatase level 10/04/2012  . Vitamin B 12 deficiency 03/06/2011  . Chronic systolic heart failure ([place]) 01/27/2011  . Mild anemia 01/20/2011  . Ventricular tachycardia ([place])   . NICM (nonischemic cardiomyopathy) ([place])   . Diabetes ([place]) 08/26/2009  . Hyperlipidemia 08/26/2009  . Essential hypertension 08/26/2009  . PROSTATE CANCER, HX OF 08/26/2009  . 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  . Personal history of adenomatous colonic polyps 09/05/2004  . Barrett's esophagus 09/05/2004    Current Outpatient Medications on File Prior to Visit  Medication Sig Dispense Refill  . amLODipine (NORVASC) 5 MG tablet TAKE 1 TABLET BY MOUTH EVERY DAY 90 tablet 1  . aspirin EC 81 MG tablet Take 81 mg by mouth daily.    [name] atorvastatin (LIPITOR) 20 MG tablet TAKE 1 TABLET BY MOUTH DAILY EXCEPT 1/2 TABLET ON TUESDAY THURSDAY AND SATURDAY (MAX ON INS) 90 tablet 1  . carvedilol (COREG) 12.5 MG tablet TAKE 1 AND 1/2 TABLETS BY MOUTH TWICE A DAY WITH MEALS (MAX ON INS) 270 tablet 1  . clonazePAM (KLONOPIN) 0.5 MG tablet Take 0.5 mg by mouth 3 (three) times daily as needed for anxiety.     . digoxin (LANOXIN) 0.125 MG tablet TAKE 1 TABLET BY MOUTH EVERY DAY 90 tablet 1  . famotidine (PEPCID) 20 MG tablet TAKE 1 TABLET BY MOUTH TWICE A DAY 180 tablet 1  . glucose blood (ONE TOUCH ULTRA TEST) test  strip USE TO CHECK BLOOD SUGAR TWICE DAILY 100 each 3  . JANUMET 50-1000 MG tablet TAKE 1 TABLET BY MOUTH DAILY WITH SUPPER 90 tablet 1  . Lancets (ONETOUCH ULTRASOFT) lancets Use as instructed to test sugar daily and prn. Dx E11.9 100 each 12  . omeprazole (PRILOSEC) 20 MG capsule TAKE 1 CAPSULE BY MOUTH DAILY. RECOMMENDED TO TAKE 30 MIN BEFORE BREAKFAST. *MAX PER INSURANCE 90 capsule 3  . ramipril (ALTACE) 10 MG capsule TAKE 1 CAPSULE (10 MG TOTAL) BY MOUTH 2 (TWO) TIMES DAILY. 180 capsule 1  . sotalol (BETAPACE) 120 MG tablet TAKE 1 TABLET BY MOUTH TWICE A DAY 180 tablet 1  .  vitamin B-12 (CYANOCOBALAMIN) 1000 MCG tablet Take 1,000 mcg by mouth daily.     No current facility-administered medications on file prior to visit.     Past Medical History:  Diagnosis Date  . AICD (automatic cardioverter/defibrillator) present   . Anxiety    since defibrillator placement  . Arthritis    knees  . Cataract   . Chronic systolic heart failure ([place])   . Colon polyps    tubular adenoma  . Diabetes mellitus   . Dysrhythmia   . GERD (gastroesophageal reflux disease)   . Hx of myocardial infarction   . Hyperlipidemia   . Hypertension   . NICM (nonischemic cardiomyopathy) ([place])    a.normal cors by cath in 2005. b. s/p Medtronic ICD.  [name] Pacemaker   . Prostate cancer ([place]) 06/2003   s/p prostatectomy  . S/P radiation therapy 10/10/2014 through 11/26/2014    Prostate bed 6600 cGy in 33 sessions   . Sleep apnea   . Ventricular tachycardia ([place])    a. s/p ICD (generator change 09/2012). b. 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 and placed on sotalol    Past Surgical History:  Procedure Laterality Date  . CARDIAC DEFIBRILLATOR PLACEMENT  03/11/2004   Medtronic Maximo single lead  . COLONOSCOPY W/ BIOPSIES AND POLYPECTOMY  08/2004, 02/24/11   84mm adenoma in 2006,  5 mm polyp (not recovered) 2012  . CYSTOSCOPY WITH DIRECT VISION INTERNAL URETHROTOMY N/A 10/08/2015   Procedure: CYSTOSCOPY WITH DIRECT VISION INTERNAL URETHROTOMY;  Surgeon: Nickie Retort, MD;  Location: [facility];  Service: Urology;  Laterality: N/A;  . IMPLANTABLE CARDIOVERTER DEFIBRILLATOR GENERATOR CHANGE N/A 10/07/2012   Procedure: IMPLANTABLE CARDIOVERTER DEFIBRILLATOR GENERATOR CHANGE;  Surgeon: Evans Lance, MD;  Location: [facility] CATH LAB;  Service: Cardiovascular;  Laterality: N/A;  . PROSTATECTOMY  06/2003   Dr Janice Norrie  . RECTAL SURGERY    . UPPER GASTROINTESTINAL ENDOSCOPY  08/2004, 02/24/11   Barrett's esophagus    Social History   Socioeconomic  History  . Marital status: Married    Spouse name: Not on file  . Number of children: 2  . Years of education: Not on file  . Highest education level: Not on file  Occupational History  . 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 Financial resource strain: Not on file  . Food insecurity:    Worry: Not on file    Inability: Not on file  . Transportation needs:    Medical: Not on file    Non-medical: Not on file  Tobacco Use  . Smoking status: Never Smoker  . Smokeless tobacco: Never Used  Substance and Sexual Activity  . Alcohol use: No  . Drug use: No  . Sexual activity: Not on file  Lifestyle  . Physical activity:  Days per week: Not on file    Minutes per session: Not on file  . Stress: Not on file  Relationships  . Social connections:    Talks on phone: Not on file    Gets together: Not on file    Attends religious service: Not on file    Active member of club or organization: Not on file    Attends meetings of clubs or organizations: Not on file    Relationship status: Not on file  Other Topics Concern  . Not on file  Social History Narrative  . Not on file    Family History  Problem Relation Age of Onset  . Coronary artery disease Father   . Prostate cancer Father   . Diabetes Father   . Hypertension Mother   . Diabetes Paternal Grandmother   . Stroke Neg Hx     Review of Systems  Constitutional: Negative for chills and fever.  Respiratory: Negative for cough, shortness of breath and wheezing.   Cardiovascular: Negative for chest pain, palpitations and leg swelling.  Gastrointestinal: Negative for abdominal pain, blood in stool (no black stool), constipation, diarrhea and nausea.  Genitourinary: Negative for dysuria and hematuria.  Neurological: Positive for numbness (occ). Negative for light-headedness and headaches.       Objective:   Vitals:   07/08/18 0910  BP: 118/70  Pulse: 69  Resp: 16  Temp: 97.9 F (36.6  C)  SpO2: 97%   BP Readings from Last 3 Encounters:  07/08/18 118/70  07/05/18 138/78  02/08/18 136/82   Wt Readings from Last 3 Encounters:  07/08/18 229 lb (103.9 kg)  07/05/18 229 lb 9.6 oz (104.1 kg)  02/08/18 225 lb (102.1 kg)   Body mass index is 31.94 kg/m.   Physical Exam    Constitutional: Appears well-developed and well-nourished. No distress.  HENT:  Head: Normocephalic and atraumatic.  Neck: Neck supple. No tracheal deviation present. No thyromegaly present.  No cervical lymphadenopathy Cardiovascular: Normal rate, regular rhythm and normal heart sounds.   No murmur heard. No carotid bruit .  No edema Pulmonary/Chest: Effort normal and breath sounds normal. No respiratory distress. No has no wheezes. No rales.  Skin: Skin is warm and dry. Not diaphoretic.  Psychiatric: Normal mood and affect. Behavior is normal.    Diabetic Foot Exam - Simple   Simple Foot Form Diabetic Foot exam was performed with the following findings:  Yes 07/08/2018  9:50 AM  Visual Inspection No deformities, no ulcerations, no other skin breakdown bilaterally:  Yes Sensation Testing Intact to touch and monofilament testing bilaterally:  Yes Pulse Check Posterior Tibialis and Dorsalis pulse intact bilaterally:  Yes Comments Mild bunions       Assessment & Plan:    See Problem List for Assessment and Plan of chronic medical problems.

## 2018-07-08 ENCOUNTER — Other Ambulatory Visit (INDEPENDENT_AMBULATORY_CARE_PROVIDER_SITE_OTHER): Payer: Medicare Other

## 2018-07-08 ENCOUNTER — Ambulatory Visit: Payer: Medicare Other | Admitting: Internal Medicine

## 2018-07-08 ENCOUNTER — Encounter: Payer: Self-pay | Admitting: Internal Medicine

## 2018-07-08 VITALS — BP 118/70 | HR 69 | Temp 97.9°F | Resp 16 | Ht 71.0 in | Wt 229.0 lb

## 2018-07-08 DIAGNOSIS — E538 Deficiency of other specified B group vitamins: Secondary | ICD-10-CM

## 2018-07-08 DIAGNOSIS — E1151 Type 2 diabetes mellitus with diabetic peripheral angiopathy without gangrene: Secondary | ICD-10-CM

## 2018-07-08 DIAGNOSIS — I5022 Chronic systolic (congestive) heart failure: Secondary | ICD-10-CM

## 2018-07-08 DIAGNOSIS — K22719 Barrett's esophagus with dysplasia, unspecified: Secondary | ICD-10-CM

## 2018-07-08 DIAGNOSIS — D649 Anemia, unspecified: Secondary | ICD-10-CM

## 2018-07-08 DIAGNOSIS — K219 Gastro-esophageal reflux disease without esophagitis: Secondary | ICD-10-CM

## 2018-07-08 DIAGNOSIS — E7849 Other hyperlipidemia: Secondary | ICD-10-CM

## 2018-07-08 DIAGNOSIS — R252 Cramp and spasm: Secondary | ICD-10-CM | POA: Diagnosis not present

## 2018-07-08 DIAGNOSIS — I10 Essential (primary) hypertension: Secondary | ICD-10-CM

## 2018-07-08 LAB — COMPREHENSIVE METABOLIC PANEL
ALK PHOS: 140 U/L — AB (ref 39–117)
ALT: 16 U/L (ref 0–53)
AST: 16 U/L (ref 0–37)
Albumin: 4.3 g/dL (ref 3.5–5.2)
BILIRUBIN TOTAL: 0.5 mg/dL (ref 0.2–1.2)
BUN: 19 mg/dL (ref 6–23)
CO2: 29 mEq/L (ref 19–32)
Calcium: 9.3 mg/dL (ref 8.4–10.5)
Chloride: 106 mEq/L (ref 96–112)
Creatinine, Ser: 1.14 mg/dL (ref 0.40–1.50)
GFR: 77.5 mL/min (ref >60.00)
Glucose, Bld: 102 mg/dL — ABNORMAL HIGH (ref 70–99)
Potassium: 4.1 mEq/L (ref 3.5–5.1)
Sodium: 142 mEq/L (ref 135–145)
Total Protein: 7.8 g/dL (ref 6.0–8.3)

## 2018-07-08 LAB — CBC WITH DIFFERENTIAL/PLATELET
BASOS ABS: 0 10*3/uL (ref 0.0–0.1)
Basophils Relative: 0.4 % (ref 0.0–3.0)
Eosinophils Absolute: 0.2 10*3/uL (ref 0.0–0.7)
Eosinophils Relative: 2.4 % (ref 0.0–5.0)
HEMATOCRIT: 40.9 % (ref 39.0–52.0)
Hemoglobin: 12.8 g/dL — ABNORMAL LOW (ref 13.0–17.0)
Lymphocytes Relative: 18.9 % (ref 12.0–46.0)
Lymphs Abs: 1.3 10*3/uL (ref 0.7–4.0)
MCHC: 31.3 g/dL (ref 30.0–36.0)
MCV: 74.1 fl — ABNORMAL LOW (ref 78.0–100.0)
Monocytes Absolute: 0.6 10*3/uL (ref 0.1–1.0)
Monocytes Relative: 8.3 % (ref 3.0–12.0)
Neutro Abs: 4.8 10*3/uL (ref 1.4–7.7)
Neutrophils Relative %: 70 % (ref 43.0–77.0)
Platelets: 206 10*3/uL (ref 150.0–400.0)
RBC: 5.52 Mil/uL (ref 4.22–5.81)
RDW: 16.4 % — ABNORMAL HIGH (ref 11.5–15.5)
WBC: 6.9 10*3/uL (ref 4.0–10.5)

## 2018-07-08 LAB — LIPID PANEL
Cholesterol: 91 mg/dL (ref 0–200)
HDL: 33.1 mg/dL — ABNORMAL LOW (ref >39.00)
LDL Cholesterol: 50 mg/dL (ref 0–99)
NonHDL: 58.38
Total CHOL/HDL Ratio: 3
Triglycerides: 42 mg/dL (ref 0.0–149.0)
VLDL: 8.4 mg/dL (ref 0.0–40.0)

## 2018-07-08 LAB — HEMOGLOBIN A1C: Hgb A1c MFr Bld: 6.8 % — ABNORMAL HIGH (ref 4.6–6.5)

## 2018-07-08 LAB — MAGNESIUM: Magnesium: 2 mg/dL (ref 1.5–2.5)

## 2018-07-08 LAB — VITAMIN B12: Vitamin B-12: 1083 pg/mL — ABNORMAL HIGH (ref 211–911)

## 2018-07-08 NOTE — Assessment & Plan Note (Signed)
BP well controlled Current regimen effective and well tolerated Continue current medications at current doses CMP 

## 2018-07-08 NOTE — Assessment & Plan Note (Signed)
Taking B12 daily Check level 

## 2018-07-08 NOTE — Assessment & Plan Note (Signed)
A1c Low sugar/carbohydrate diet Continue increased activity  follow-up in 6 months

## 2018-07-08 NOTE — Assessment & Plan Note (Signed)
GERD controlled Due for EGD-we will be seeing Dr. Carlean Purl in the next few months for repeat colonoscopy and probable EGD Taking omeprazole daily-continue Taking Pepcid twice daily-advised he can try this once a day

## 2018-07-08 NOTE — Assessment & Plan Note (Addendum)
Check lipid panel, CMP, TSH Continue daily statin Regular exercise and healthy diet encouraged  

## 2018-07-08 NOTE — Assessment & Plan Note (Signed)
Has been experiencing muscle cramping-occurs both at night and during the day and typically only in the calfs Not always related to activity so PAD seems less likely, palpable DP pulses bilaterally States he keeps up with hydration Will check CMP, magnesium Can treat symptomatically if needed

## 2018-07-08 NOTE — Assessment & Plan Note (Signed)
GERD controlled Continue omeprazole Will dec pepcid to once a day and then stop if GERD controlled Due for EGD this year  - will contact Dr Carlean Purl

## 2018-07-08 NOTE — Assessment & Plan Note (Signed)
Euvolemic No concerning symptoms Continue current medications CMP

## 2018-07-08 NOTE — Assessment & Plan Note (Signed)
Anemia is chronic and mild-goes back to at least 2011 CBC, iron panel, B12

## 2018-07-09 ENCOUNTER — Encounter: Payer: Self-pay | Admitting: Internal Medicine

## 2018-07-09 LAB — IRON,TIBC AND FERRITIN PANEL
%SAT: 27 % (calc) (ref 20–48)
Ferritin: 141 ng/mL (ref 24–380)
IRON: 74 ug/dL (ref 50–180)
TIBC: 279 mcg/dL (calc) (ref 250–425)

## 2018-07-18 ENCOUNTER — Telehealth: Payer: Self-pay | Admitting: Emergency Medicine

## 2018-07-18 NOTE — Telephone Encounter (Signed)
Spoke with pt to schedule AWV. States he would like to wait until his follow-up with Dr Quay Burow. States he is having an operation soon.

## 2018-08-07 ENCOUNTER — Other Ambulatory Visit: Payer: Self-pay | Admitting: Internal Medicine

## 2018-08-17 ENCOUNTER — Other Ambulatory Visit: Payer: Self-pay | Admitting: Internal Medicine

## 2018-08-23 ENCOUNTER — Ambulatory Visit (INDEPENDENT_AMBULATORY_CARE_PROVIDER_SITE_OTHER): Payer: Medicare Other | Admitting: Internal Medicine

## 2018-08-23 ENCOUNTER — Encounter: Payer: Self-pay | Admitting: Internal Medicine

## 2018-08-23 DIAGNOSIS — G8929 Other chronic pain: Secondary | ICD-10-CM

## 2018-08-23 DIAGNOSIS — K219 Gastro-esophageal reflux disease without esophagitis: Secondary | ICD-10-CM | POA: Diagnosis not present

## 2018-08-23 DIAGNOSIS — M546 Pain in thoracic spine: Secondary | ICD-10-CM | POA: Diagnosis not present

## 2018-08-23 DIAGNOSIS — G2581 Restless legs syndrome: Secondary | ICD-10-CM | POA: Diagnosis not present

## 2018-08-23 MED ORDER — CLONAZEPAM 0.5 MG PO TABS: 0.5000 mg | ORAL_TABLET | Freq: Every evening | ORAL | 0 refills | Status: DC | PRN

## 2018-08-23 NOTE — Assessment & Plan Note (Signed)
He has not been on any medication for 2-3 years, but his RLS started up last night He was on clonazepam 0.5 mg during the day and at night as needed He had difficulty sleeping last night, especially after getting up and going to the bathroom-he was not able to fall back asleep We will restart clonazepam-discussed potential side effects, including addiction Hopefully he will only need this short-term

## 2018-08-23 NOTE — Assessment & Plan Note (Signed)
He is concerned about some medications that may negatively affect his memory, omeprazole and famotidine are on the top of the list Even his history of Barrett's advised him not to stop medications His GERD is currently controlled Discussed changing his diet-discontinue drinking soda He is due for an EGD-can discuss with GI after his EGD if he still needs both of these medications

## 2018-08-23 NOTE — Progress Notes (Signed)
Virtual Visit via Video Note  I connected with [name] on 08/23/18 at  3:30 PM EDT by a video enabled telemedicine application and verified that I am speaking with the correct person using two identifiers.   I discussed the limitations of evaluation and management by telemedicine and the availability of in person appointments. The patient expressed understanding and agreed to proceed.  The patient is currently at home and I am in the office.    No referring provider.    History of Present Illness: This visit is for an acute visit:  Back pain   Back pain: He has intermittent left mid back pain.  He had it last night, but has had in the past.  He states it is somewhat positional because if he gets in a certain position it will not hurt in other positions or movements will increase his pain.  He has been walking around all day and he has not had any pain.  The pain does not radiate.  He has not had any fevers, dysuria or hematuria.  He has taken ibuprofen 600 mg in the past and that is effective.  He did see orthopedics for this and had x-rays, but has not been able to follow-up yet because of the coronavirus situation.   RLS: He has a history of restless leg syndrome.  He used to be on clonazepam for this in the past, but has not needed to use it for the past 2-3 years.  He recalls that it did help.  He describes as a bilateral whole leg tingling sensation that he has to just cannot walk around.  Last night it was severe and he was not able to sleep.  Today he is up and walking around most of the day and that has helped, but he can still feel it a little bit.  He states the only big change in his lifestyle is that he is not currently working-he was laid off and typically he would be walking around a lot and would go to bed later and he would be very tired.  He has less difficulty falling this week, but if he gets up in the middle the night to go to the bathroom he does have difficulty going  back to sleep.  Concerns about medications causing memory issues: He has some concerns about 3 of his medications that state that they can affect memory negatively.  The omeprazole, famotidine and atorvastatin.  There reviewing everything discussed that he should not go off the atorvastatin.  Also discussed that because of his history of Barrett's esophagus he should not go off of the either the omeprazole or famotidine.  He is due for an EGD and advised him to wait till after this and discuss with GI regarding GERD medications.  I did discuss with him changing his diet-he drinks a lot of soda and by cutting that out that will stomach and may aid in him getting off his medications.   Social History   Socioeconomic History  . Marital status: Married    Spouse name: Not on file  . Number of children: 2  . Years of education: Not on file  . Highest education level: Not on file  Occupational History  . 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 Financial resource strain: Not on file  . Food insecurity:    Worry: Not on file    Inability: Not on file  . Transportation needs:  Medical: Not on file    Non-medical: Not on file  Tobacco Use  . Smoking status: Never Smoker  . Smokeless tobacco: Never Used  Substance and Sexual Activity  . Alcohol use: No  . Drug use: No  . Sexual activity: Not on file  Lifestyle  . Physical activity:    Days per week: Not on file    Minutes per session: Not on file  . Stress: Not on file  Relationships  . Social connections:    Talks on phone: Not on file    Gets together: Not on file    Attends religious service: Not on file    Active member of club or organization: Not on file    Attends meetings of clubs or organizations: Not on file    Relationship status: Not on file  Other Topics Concern  . Not on file  Social History Narrative  . Not on file     Observations/Objective: Appears well in NAD He points to the  area of his back pain which is left-sided middle back.  Assessment and Plan:  See Problem List for Assessment and Plan of chronic medical problems.   Follow Up Instructions:    I discussed the assessment and treatment plan with the patient. The patient was provided an opportunity to ask questions and all were answered. The patient agreed with the plan and demonstrated an understanding of the instructions.   The patient was advised to call back or seek an in-person evaluation if the symptoms worsen or if the condition fails to improve as anticipated.    Binnie Rail, MD

## 2018-08-23 NOTE — Assessment & Plan Note (Signed)
Intermittent left middle back pain Positional in nature No concerning urinary symptoms He has seen orthopedics for this already and had imaging, but has not followed up yet due to the coronavirus situation Ibuprofen 600 mg helps Continue ibuprofen Follow-up with orthopedics when able

## 2018-08-25 ENCOUNTER — Ambulatory Visit (HOSPITAL_COMMUNITY): Admission: RE | Admit: 2018-08-25 | Payer: Medicare Other | Source: Home / Self Care | Admitting: Urology

## 2018-08-25 ENCOUNTER — Encounter (HOSPITAL_COMMUNITY): Admission: RE | Payer: Self-pay | Source: Home / Self Care

## 2018-08-25 SURGERY — CYSTOSCOPY WITH RETROGRADE URETHROGRAM
Anesthesia: General

## 2018-08-27 ENCOUNTER — Other Ambulatory Visit: Payer: Self-pay | Admitting: Internal Medicine

## 2018-09-04 ENCOUNTER — Encounter: Payer: Self-pay | Admitting: Internal Medicine

## 2018-09-05 ENCOUNTER — Ambulatory Visit (INDEPENDENT_AMBULATORY_CARE_PROVIDER_SITE_OTHER): Payer: Medicare Other | Admitting: Internal Medicine

## 2018-09-05 ENCOUNTER — Encounter: Payer: Self-pay | Admitting: Internal Medicine

## 2018-09-05 DIAGNOSIS — N3000 Acute cystitis without hematuria: Secondary | ICD-10-CM | POA: Diagnosis not present

## 2018-09-05 MED ORDER — AMOXICILLIN-POT CLAVULANATE 875-125 MG PO TABS: 1.0000 | ORAL_TABLET | Freq: Two times a day (BID) | ORAL | 0 refills | Status: DC

## 2018-09-05 NOTE — Progress Notes (Signed)
Failed Virtual Visit via Video Note, converted to Telephone visit  He tried several time to connect to doxy and had done it successfully earlier this month, but was unable to.  His camera and audio both did not connect.  After several attempts I called him and we talked over the phone.    I connected with [name] on 09/05/18 at  9:45 AM EDT by telephone after video visit failed and verified that I am speaking with the correct person using two identifiers.   I discussed the limitations of evaluation and management by telemedicine and the availability of in person appointments. The patient expressed understanding and agreed to proceed.  The patient is currently at home and I am in the office.    No referring provider.    History of Present Illness: This is an acute visit for ? UTI.  He has chronic urinary symptoms since his prostate cancer treatment.  He has a urethral stricture.  He has chronic urinary frequency, incontinence.  He has had acute cystitis in 04/2016.    We last talked via virtual video visit 4/14 and he was experiencing left mid back pain.  At that time he was not having any urinary symptoms, but states that his urinary symptoms started just after that visit.  He does wonder if the back pain is related to possible UTI.  He states dysuria, increased urinary frequency with decreased amount of urine.  One night recently he had nocturia x10.  He denies any hematuria or odor to the urine.  Last night he did have some suprapubic pain that resolved on its own.  He denies any nausea or fevers.  He states the symptoms are more than his usual urinary symptoms and he is concerned about a UTI.   Social History   Socioeconomic History  . Marital status: Married    Spouse name: Not on file  . Number of children: 2  . Years of education: Not on file  . Highest education level: Not on file  Occupational History  . 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 Financial resource strain: Not on file  . Food insecurity:    Worry: Not on file    Inability: Not on file  . Transportation needs:    Medical: Not on file    Non-medical: Not on file  Tobacco Use  . Smoking status: Never Smoker  . Smokeless tobacco: Never Used  Substance and Sexual Activity  . Alcohol use: No  . Drug use: No  . Sexual activity: Not on file  Lifestyle  . Physical activity:    Days per week: Not on file    Minutes per session: Not on file  . Stress: Not on file  Relationships  . Social connections:    Talks on phone: Not on file    Gets together: Not on file    Attends religious service: Not on file    Active member of club or organization: Not on file    Attends meetings of clubs or organizations: Not on file    Relationship status: Not on file  Other Topics Concern  . Not on file  Social History Narrative  . Not on file     Observations/Objective: None   Assessment and Plan:  See Problem List for Assessment and Plan of chronic medical problems.   Follow Up Instructions:    I discussed the assessment and treatment plan with the patient. The  patient was provided an opportunity to ask questions and all were answered. The patient agreed with the plan and demonstrated an understanding of the instructions.   The patient was advised to call back or seek an in-person evaluation if the symptoms worsen or if the condition fails to improve as anticipated.    Binnie Rail, MD

## 2018-09-05 NOTE — Assessment & Plan Note (Signed)
Symptoms consistent with acute cystitis Most likely his left mid back pain is not related because there are no symptoms suggestive of pyelonephritis-no fever no nausea and his back pain has been going on for long enough that that seems less likely We will go ahead and treat empirically with Augmentin twice daily x7 days If his symptoms do not improve I would want him to come in and leave a urine sample and we may need to consider imaging if his back pain persists  He will let me know if his symptoms do not improve

## 2018-09-11 ENCOUNTER — Other Ambulatory Visit: Payer: Self-pay | Admitting: Internal Medicine

## 2018-09-15 ENCOUNTER — Other Ambulatory Visit: Payer: Self-pay | Admitting: Internal Medicine

## 2018-09-22 ENCOUNTER — Ambulatory Visit (INDEPENDENT_AMBULATORY_CARE_PROVIDER_SITE_OTHER): Payer: Medicare Other | Admitting: *Deleted

## 2018-09-22 ENCOUNTER — Other Ambulatory Visit: Payer: Self-pay

## 2018-09-22 DIAGNOSIS — I428 Other cardiomyopathies: Secondary | ICD-10-CM

## 2018-09-22 LAB — CUP PACEART REMOTE DEVICE CHECK
Battery Remaining Longevity: 77 mo
Battery Voltage: 3.01 V
Brady Statistic RV Percent Paced: 0.01 %
Date Time Interrogation Session: 20200514062704
HighPow Impedance: 61 Ohm
Implantable Lead Implant Date: 20140530
Implantable Lead Location: 753860
Implantable Lead Model: 6935
Implantable Pulse Generator Implant Date: 20140530
Lead Channel Impedance Value: 418 Ohm
Lead Channel Impedance Value: 475 Ohm
Lead Channel Pacing Threshold Amplitude: 0.5 V
Lead Channel Pacing Threshold Pulse Width: 0.4 ms
Lead Channel Sensing Intrinsic Amplitude: 6.25 mV
Lead Channel Sensing Intrinsic Amplitude: 6.25 mV
Lead Channel Setting Pacing Amplitude: 2.5 V
Lead Channel Setting Pacing Pulse Width: 0.4 ms
Lead Channel Setting Sensing Sensitivity: 0.3 mV

## 2018-09-29 ENCOUNTER — Encounter: Payer: Self-pay | Admitting: Cardiology

## 2018-09-29 NOTE — Progress Notes (Signed)
Remote ICD transmission.   

## 2018-10-18 NOTE — Progress Notes (Signed)
Virtual Visit via Video Note  I connected with [name] on 10/19/18 at  9:00 AM EDT by a video enabled telemedicine application and verified that I am speaking with the correct person using two identifiers.   I discussed the limitations of evaluation and management by telemedicine and the availability of in person appointments. The patient expressed understanding and agreed to proceed.  The patient is currently at home and I am in the office.    No referring provider.    History of Present Illness: This is an acute visit for frequent urination.  He has a history of prostate cancer.  He has a urethral stricture.  He has chronic urinary frequency, incontinence.    We had a virtual visit on 4/27 for urinary symptoms.  We treated that with augmentin x 7 days.  The symptoms did not go away completely.  He was going to call for an additional week of antibiotics, but did not.  He is still having the same symptoms of dysuria and increased urinary frequency.  He denies any blood in the urine, flank pain, abdominal pain, nausea and fevers.  Two weeks pain under left axilla.  For the past 2 weeks he has had intermittent spasm-like pain in his left axilla.  It occurs at rest or with activity.  It feels like a lump and applying pressure on it helps to ease it up.  He has been experiencing intermittent pain on the right posterior lower head that radiates up towards the temple.  It is transient.  He has not felt it recently.     Review of Systems  Constitutional: Negative for chills and fever.  Gastrointestinal: Negative for abdominal pain and nausea.  Genitourinary: Positive for dysuria and frequency. Negative for flank pain and hematuria.  Musculoskeletal: Positive for back pain (chronic, intermittent - not new).     Social History   Socioeconomic History  . Marital status: Married    Spouse name: Not on file  . Number of children: 2  . Years of education: Not on file  . Highest  education level: Not on file  Occupational History  . 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 Financial resource strain: Not on file  . Food insecurity:    Worry: Not on file    Inability: Not on file  . Transportation needs:    Medical: Not on file    Non-medical: Not on file  Tobacco Use  . Smoking status: Never Smoker  . Smokeless tobacco: Never Used  Substance and Sexual Activity  . Alcohol use: No  . Drug use: No  . Sexual activity: Not on file  Lifestyle  . Physical activity:    Days per week: Not on file    Minutes per session: Not on file  . Stress: Not on file  Relationships  . Social connections:    Talks on phone: Not on file    Gets together: Not on file    Attends religious service: Not on file    Active member of club or organization: Not on file    Attends meetings of clubs or organizations: Not on file    Relationship status: Not on file  Other Topics Concern  . Not on file  Social History Narrative  . Not on file     Observations/Objective: Appears well in NAD   Assessment and Plan:  See Problem List for Assessment and Plan of chronic medical problems.  Follow Up Instructions:    I discussed the assessment and treatment plan with the patient. The patient was provided an opportunity to ask questions and all were answered. The patient agreed with the plan and demonstrated an understanding of the instructions.   The patient was advised to call back or seek an in-person evaluation if the symptoms worsen or if the condition fails to improve as anticipated.    Binnie Rail, MD

## 2018-10-19 ENCOUNTER — Encounter: Payer: Self-pay | Admitting: Internal Medicine

## 2018-10-19 ENCOUNTER — Ambulatory Visit (INDEPENDENT_AMBULATORY_CARE_PROVIDER_SITE_OTHER): Payer: Medicare Other | Admitting: Internal Medicine

## 2018-10-19 ENCOUNTER — Other Ambulatory Visit: Payer: Self-pay

## 2018-10-19 DIAGNOSIS — M792 Neuralgia and neuritis, unspecified: Secondary | ICD-10-CM | POA: Insufficient documentation

## 2018-10-19 DIAGNOSIS — M79622 Pain in left upper arm: Secondary | ICD-10-CM | POA: Diagnosis not present

## 2018-10-19 DIAGNOSIS — R300 Dysuria: Secondary | ICD-10-CM | POA: Insufficient documentation

## 2018-10-19 HISTORY — DX: Neuralgia and neuritis, unspecified: M79.2

## 2018-10-19 MED ORDER — CIPROFLOXACIN HCL 500 MG PO TABS: 500.0000 mg | ORAL_TABLET | Freq: Two times a day (BID) | ORAL | 0 refills | Status: DC

## 2018-10-19 NOTE — Assessment & Plan Note (Signed)
Pain from right posterior lower skull that radiates up to the head is likely neuralgia Discussed with him that it could be from arthritis at the base of the skull/neck or muscle spasms Treat neck spasms, discomfort Treat symptomatically Call if no improvement

## 2018-10-19 NOTE — Assessment & Plan Note (Addendum)
Had similar symptoms approximately 1 month ago and they treated him with 1 week of Augmentin-symptoms improved, but did not completely resolve Possible acute cystitis, less likely prostatitis given history of prostate cancer that was treated with a TURP Ideally I would like to check a urinalysis, urine culture, but given that this is virtual we will treat empirically If his symptoms do not completely go away he will need to provide a urine sample or possibly follow-up with urology-he does have a history of a urethral stricture, which could be causing some symptoms Cipro 500 twice daily x2 weeks

## 2018-10-19 NOTE — Assessment & Plan Note (Signed)
Intermittent left axilla pain and swelling-feels like a lump at times Pain and lump relieved with pressure Feels like a spasm It is difficult to say virtually what this is Advised for him to monitor it and if it does continue he may need to come in for an evaluation

## 2018-11-24 ENCOUNTER — Other Ambulatory Visit: Payer: Self-pay | Admitting: Internal Medicine

## 2018-11-28 ENCOUNTER — Other Ambulatory Visit: Payer: Self-pay | Admitting: Internal Medicine

## 2018-12-04 ENCOUNTER — Other Ambulatory Visit: Payer: Self-pay | Admitting: Internal Medicine

## 2018-12-22 ENCOUNTER — Ambulatory Visit (INDEPENDENT_AMBULATORY_CARE_PROVIDER_SITE_OTHER): Payer: Medicare Other | Admitting: *Deleted

## 2018-12-22 DIAGNOSIS — I428 Other cardiomyopathies: Secondary | ICD-10-CM

## 2018-12-22 LAB — CUP PACEART REMOTE DEVICE CHECK
Battery Remaining Longevity: 73 mo
Battery Voltage: 3.01 V
Brady Statistic RV Percent Paced: 0.01 %
Date Time Interrogation Session: 20200813084225
HighPow Impedance: 58 Ohm
Implantable Lead Implant Date: 20140530
Implantable Lead Location: 753860
Implantable Lead Model: 6935
Implantable Pulse Generator Implant Date: 20140530
Lead Channel Impedance Value: 399 Ohm
Lead Channel Impedance Value: 456 Ohm
Lead Channel Pacing Threshold Amplitude: 0.5 V
Lead Channel Pacing Threshold Pulse Width: 0.4 ms
Lead Channel Sensing Intrinsic Amplitude: 6.25 mV
Lead Channel Setting Pacing Amplitude: 2.5 V
Lead Channel Setting Pacing Pulse Width: 0.4 ms
Lead Channel Setting Sensing Sensitivity: 0.3 mV

## 2018-12-30 ENCOUNTER — Other Ambulatory Visit: Payer: Self-pay | Admitting: Internal Medicine

## 2019-01-02 ENCOUNTER — Encounter: Payer: Self-pay | Admitting: Cardiology

## 2019-01-02 NOTE — Progress Notes (Signed)
Remote ICD transmission.   

## 2019-02-01 ENCOUNTER — Other Ambulatory Visit: Payer: Self-pay | Admitting: Internal Medicine

## 2019-02-02 ENCOUNTER — Other Ambulatory Visit: Payer: Self-pay | Admitting: Internal Medicine

## 2019-02-09 ENCOUNTER — Other Ambulatory Visit: Payer: Self-pay | Admitting: Internal Medicine

## 2019-02-16 ENCOUNTER — Other Ambulatory Visit: Payer: Self-pay | Admitting: Internal Medicine

## 2019-02-19 ENCOUNTER — Other Ambulatory Visit: Payer: Self-pay | Admitting: Internal Medicine

## 2019-02-26 ENCOUNTER — Other Ambulatory Visit: Payer: Self-pay | Admitting: Internal Medicine

## 2019-03-01 ENCOUNTER — Other Ambulatory Visit: Payer: Self-pay | Admitting: Internal Medicine

## 2019-03-09 ENCOUNTER — Other Ambulatory Visit: Payer: Self-pay | Admitting: Internal Medicine

## 2019-03-23 ENCOUNTER — Telehealth: Payer: Self-pay | Admitting: Internal Medicine

## 2019-03-23 ENCOUNTER — Ambulatory Visit (INDEPENDENT_AMBULATORY_CARE_PROVIDER_SITE_OTHER): Payer: Medicare Other | Admitting: *Deleted

## 2019-03-23 DIAGNOSIS — I5022 Chronic systolic (congestive) heart failure: Secondary | ICD-10-CM

## 2019-03-23 DIAGNOSIS — I472 Ventricular tachycardia, unspecified: Secondary | ICD-10-CM

## 2019-03-23 LAB — CUP PACEART REMOTE DEVICE CHECK
Battery Remaining Longevity: 68 mo
Battery Voltage: 3 V
Brady Statistic RV Percent Paced: 0.01 %
Date Time Interrogation Session: 20201112062405
HighPow Impedance: 65 Ohm
Implantable Lead Implant Date: 20140530
Implantable Lead Location: 753860
Implantable Lead Model: 6935
Implantable Pulse Generator Implant Date: 20140530
Lead Channel Impedance Value: 399 Ohm
Lead Channel Impedance Value: 456 Ohm
Lead Channel Pacing Threshold Amplitude: 0.5 V
Lead Channel Pacing Threshold Pulse Width: 0.4 ms
Lead Channel Sensing Intrinsic Amplitude: 7.375 mV
Lead Channel Sensing Intrinsic Amplitude: 7.375 mV
Lead Channel Setting Pacing Amplitude: 2.5 V
Lead Channel Setting Pacing Pulse Width: 0.4 ms
Lead Channel Setting Sensing Sensitivity: 0.3 mV

## 2019-03-23 NOTE — Telephone Encounter (Signed)
°  1. Has your device fired?   2. Is you device beeping?   3. Are you experiencing draining or swelling at device site?   4. Are you calling to see if we received your device transmission? Patient wants to know if transmission went through that he sent this morning.   5. Have you passed out?    Please route to [place]

## 2019-03-29 NOTE — Progress Notes (Signed)
Cardiology Office Note Date:  03/31/2019  Patient ID:  Nam, [name] 07/24/1950, MRN [id number] PCP:  Jesse Rail, MD  Cardiologist:  Dr. Lovena Hernandez     Chief Complaint: 6 mo visit  History of Present Illness: [name] is a 68 y.o. male with history of anxiety, arthritis, GERD, HTN, HLD, NICM, chronic CHF (systolic), VT, ICD.  He comes in today to be seen for Dr. Lovena Hernandez.  Last seen by him in feb for pre-op evaluation for cystoscopy, cleared to proceed.  Class II symptoms, no changes were made.  He is doing well.  He has an increase in what he calls "the backwards beats" these are fleeting quick beats, but more often then usual.  No CP, no dizzy spells, near syncope or syncope. He has gained 13lbs, unfortunately was laid off with COVID (worked in [employer]) and admits to eating more doing less.  He does not feel like he is swollen, bloated. He denies SOB, DOE, no difficulties with his ADLs, mows the grass, does a lot of yard work without difficulty.  No shocks  He has labs done at least annually by his PMD, including lipids  Device information MDT single chamber ICD, current system implanted 10/07/2012 Initial device implant was ?2005, 6949 lead was capped/abandoned AAD 2012, VT storm, numerous shocks, started on sotalol  Past Medical History:  Diagnosis Date  . AICD (automatic cardioverter/defibrillator) present   . Anxiety    since defibrillator placement  . Arthritis    knees  . Cataract   . Chronic systolic heart failure ([place])   . Colon polyps    tubular adenoma  . Diabetes mellitus   . Dysrhythmia   . GERD (gastroesophageal reflux disease)   . Hx of myocardial infarction   . Hyperlipidemia   . Hypertension   . NICM (nonischemic cardiomyopathy) ([place])    a.normal cors by cath in 2005. b. s/p Medtronic ICD.  [name] Pacemaker   . Prostate cancer ([place]) 06/2003   s/p prostatectomy  . S/P radiation therapy 10/10/2014 through  11/26/2014    Prostate bed 6600 cGy in 33 sessions   . Sleep apnea   . Ventricular tachycardia ([place])    a. s/p ICD (generator change 09/2012). b. 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 and placed on sotalol    Past Surgical History:  Procedure Laterality Date  . CARDIAC DEFIBRILLATOR PLACEMENT  03/11/2004   Medtronic Maximo single lead  . COLONOSCOPY W/ BIOPSIES AND POLYPECTOMY  08/2004, 02/24/11   74mm adenoma in 2006,  5 mm polyp (not recovered) 2012  . CYSTOSCOPY WITH DIRECT VISION INTERNAL URETHROTOMY N/A 10/08/2015   Procedure: CYSTOSCOPY WITH DIRECT VISION INTERNAL URETHROTOMY;  Surgeon: Jesse Retort, MD;  Location: [facility];  Service: Urology;  Laterality: N/A;  . IMPLANTABLE CARDIOVERTER DEFIBRILLATOR GENERATOR CHANGE N/A 10/07/2012   Procedure: IMPLANTABLE CARDIOVERTER DEFIBRILLATOR GENERATOR CHANGE;  Surgeon: Evans Lance, MD;  Location: [facility] CATH LAB;  Service: Cardiovascular;  Laterality: N/A;  . PROSTATECTOMY  06/2003   Dr [name]  . RECTAL SURGERY    . UPPER GASTROINTESTINAL ENDOSCOPY  08/2004, 02/24/11   Barrett's esophagus    Current Outpatient Medications  Medication Sig Dispense Refill  . amLODipine (NORVASC) 5 MG tablet TAKE 1 TABLET BY MOUTH EVERY DAY 90 tablet 1  . aspirin EC 81 MG tablet Take 81 mg by mouth daily.    [name] atorvastatin (LIPITOR) 20 MG tablet Take 1 tablet by mouth daily except 1/2 tablet on Tuesday, Thursday  and Saturday. Need office visit for more refills 30 tablet 0  . carvedilol (COREG) 12.5 MG tablet TAKE 1 AND 1/2 TABLETS BY MOUTH TWICE A DAY WITH MEALS (MAX ON INS) 270 tablet 0  . clonazePAM (KLONOPIN) 0.5 MG tablet Take 1 tablet (0.5 mg total) by mouth at bedtime as needed for anxiety. 30 tablet 0  . digoxin (LANOXIN) 0.125 MG tablet TAKE 1 TABLET BY MOUTH EVERY DAY 90 tablet 1  . famotidine (PEPCID) 20 MG tablet TAKE 1 TABLET (20 MG TOTAL) BY MOUTH 2 (TWO) TIMES DAILY. NEED OFFICE VISIT FOR MORE  REFILLS 60 tablet 0  . JANUMET 50-1000 MG tablet TAKE 1 TABLET BY MOUTH DAILY WITH SUPPER 90 tablet 0  . Lancets (ONETOUCH ULTRASOFT) lancets Use as instructed to test sugar daily and prn. Dx E11.9 100 each 12  . omeprazole (PRILOSEC) 20 MG capsule TAKE 1 CAPSULE BY MOUTH DAILY. RECOMMENDED TO TAKE 30 MIN BEFORE BREAKFAST. *MAX PER INSURANCE 90 capsule 1  . ONETOUCH ULTRA test strip USE TO CHECK BLOOD SUGAR TWICE DAILY 100 strip 3  . ramipril (ALTACE) 10 MG capsule TAKE 1 CAPSULE (10 MG TOTAL) BY MOUTH 2 (TWO) TIMES DAILY. 180 capsule 0  . sotalol (BETAPACE) 120 MG tablet TAKE 1 TABLET BY MOUTH TWICE A DAY 180 tablet 1  . vitamin B-12 (CYANOCOBALAMIN) 1000 MCG tablet Take 1,000 mcg by mouth daily.     No current facility-administered medications for this visit.     Allergies:   Zantac [ranitidine hcl]   Social History:  The patient  reports that he has never smoked. He has never used smokeless tobacco. He reports that he does not drink alcohol or use drugs.   Family History:  The patient's family history includes Coronary artery disease in his father; Diabetes in his father and paternal grandmother; Hypertension in his mother; Prostate cancer in his father.  ROS:  Please see the history of present illness.  All other systems are reviewed and otherwise negative.   PHYSICAL EXAM:  VS:  BP (!) 144/76   Pulse 75   Ht 5\' 10"  (1.778 m)   Wt 242 lb (109.8 kg)   BMI 34.72 kg/m  BMI: Body mass index is 34.72 kg/m. Well nourished, well developed, in no acute distress  HEENT: normocephalic, atraumatic  Neck: no JVD, carotid bruits or masses Cardiac:  RRR; no significant murmurs, no rubs, or gallops Lungs:  CTA b/l, no wheezing, rhonchi or rales  Abd: soft, nontender, obese MS: no deformity or atrophy Ext:  trace edema  Skin: warm and dry, no rash Neuro:  No gross deficits appreciated Psych: euthymic mood, full affect  ICD site is stable, no tethering or discomfort   EKG:  Done today  and reviewed by myself;  SR 76, T changes appear similar to older EKGs, QT manually measured 43ms, QTc 458ms ICD interrogation done today and reviewed by myself;  Battery and lead measurements are good 7 NSVT since feb, longest 4 sec PVC burden looks similar to prior OptiVol looks great    05/24/2003: TTE SUMMARY  - Overall left ventricular systolic function was moderately to     markedly decreased. Left ventricular ejection fraction was     estimated , range being 25 % to 35 %. There was moderate     diffuse left ventricular hypokinesis.    Recent Labs: 07/08/2018: ALT 16; BUN 19; Creatinine, Ser 1.14; Hemoglobin 12.8; Magnesium 2.0; Platelets 206.0; Potassium 4.1; Sodium 142  07/08/2018: Cholesterol 91; HDL  33.10; LDL Cholesterol 50; Total CHOL/HDL Ratio 3; Triglycerides 42.0; VLDL 8.4   CrCl cannot be calculated (Patient's most recent lab result is older than the maximum 21 days allowed.).   Wt Readings from Last 3 Encounters:  03/31/19 242 lb (109.8 kg)  07/08/18 229 lb (103.9 kg)  07/05/18 229 lb 9.6 oz (104.1 kg)     Other studies reviewed: Additional studies/records reviewed today include: summarized above  ASSESSMENT AND PLAN:  1. ICD 2. H/o VT storm     onsotalol, stable QT  Few NSVT only No programming changes amde  3. NICM 4. Chronic CF (systolic)     No symptoms or exam findings of volume OL     OptiVol looks great     On BB/ACE, dig  Needs labs  5. HTN     No changes today   We discussed his weight gain and sedentary lifestyle since being laid off.  He plans to stay retired now, and we reviewed his activity  Data on his device.  Importance of exercise, weight management, healthy eating.   We will update his echo, have him come in for labs dig level (prior to taking h is dig),BME and mag levels.        Disposition: F/u with above, Q 3 mo remotes, in-clinic in 66mo, sooner if needed   Current medicines are reviewed at length with  the patient today.  The patient did not have any concerns regarding medicines.  Venetia Night, PA-C 03/31/2019 10:56 AM     [facility] [place] [address] [phone] (office)  [phone] (fax)

## 2019-03-31 ENCOUNTER — Other Ambulatory Visit: Payer: Self-pay

## 2019-03-31 ENCOUNTER — Ambulatory Visit (INDEPENDENT_AMBULATORY_CARE_PROVIDER_SITE_OTHER): Payer: Medicare Other | Admitting: Physician Assistant

## 2019-03-31 VITALS — BP 144/76 | HR 75 | Ht 70.0 in | Wt 242.0 lb

## 2019-03-31 DIAGNOSIS — I428 Other cardiomyopathies: Secondary | ICD-10-CM | POA: Diagnosis not present

## 2019-03-31 DIAGNOSIS — Z79899 Other long term (current) drug therapy: Secondary | ICD-10-CM | POA: Diagnosis not present

## 2019-03-31 DIAGNOSIS — I10 Essential (primary) hypertension: Secondary | ICD-10-CM

## 2019-03-31 DIAGNOSIS — I5022 Chronic systolic (congestive) heart failure: Secondary | ICD-10-CM

## 2019-03-31 DIAGNOSIS — I472 Ventricular tachycardia, unspecified: Secondary | ICD-10-CM

## 2019-03-31 DIAGNOSIS — Z95810 Presence of automatic (implantable) cardiac defibrillator: Secondary | ICD-10-CM

## 2019-03-31 NOTE — Patient Instructions (Addendum)
Medication Instructions:  Your physician recommends that you continue on your current medications as directed. Please refer to the Current Medication list given to you today.  *If you need a refill on your cardiac medications before your next appointment, please call your pharmacy*  Lab Work: BMET MAG  ON SAME  DAY AS ECHO   HOLD DIGOXIN TILL AFTER [place]    If you have labs (blood work) drawn today and your tests are completely normal, you will receive your results only by: [name] MyChart Message (if you have MyChart) OR . A paper copy in the mail If you have any lab test that is abnormal or we need to change your treatment, we will call you to review the results.  Testing/Procedures:  LABS SAME DAYS AS TEST Your physician has requested that you have an echocardiogram. Echocardiography is a painless test that uses sound waves to create images of your heart. It provides your doctor with information about the size and shape of your heart and how well your heart's chambers and valves are working. This procedure takes approximately one hour. There are no restrictions for this procedure.  Follow-Up: At [facility], you and your health needs are our priority.  As part of our continuing mission to provide you with exceptional heart care, we have created designated Provider Care Teams.  These Care Teams include your primary Cardiologist (physician) and Advanced Practice Providers (APPs -  Physician Assistants and Nurse Practitioners) who all work together to provide you with the care you need, when you need it.  Your next appointment:   6 month(s)  The format for your next appointment:   In Person  Provider:   You may see Lovena Le  or one of the following Advanced Practice Providers on your designated Care Team:    Chanetta Marshall, NP  Tommye Standard, PA-C  Legrand Como "Jonni Sanger" Chalmers Cater, [place]   Other Instructions

## 2019-04-04 ENCOUNTER — Other Ambulatory Visit: Payer: Self-pay | Admitting: Internal Medicine

## 2019-04-13 ENCOUNTER — Other Ambulatory Visit: Payer: Self-pay

## 2019-04-13 ENCOUNTER — Other Ambulatory Visit: Payer: Medicare Other | Admitting: *Deleted

## 2019-04-13 ENCOUNTER — Ambulatory Visit (HOSPITAL_COMMUNITY): Payer: Medicare Other | Attending: Cardiovascular Disease

## 2019-04-13 DIAGNOSIS — Z79899 Other long term (current) drug therapy: Secondary | ICD-10-CM

## 2019-04-14 LAB — BASIC METABOLIC PANEL
BUN/Creatinine Ratio: 11 (ref 10–24)
BUN: 11 mg/dL (ref 8–27)
CO2: 26 mmol/L (ref 20–29)
Calcium: 8.7 mg/dL (ref 8.6–10.2)
Chloride: 104 mmol/L (ref 96–106)
Creatinine, Ser: 1 mg/dL (ref 0.76–1.27)
GFR calc Af Amer: 90 mL/min/{1.73_m2} (ref >59)
GFR calc non Af Amer: 78 mL/min/{1.73_m2} (ref >59)
Glucose: 217 mg/dL — ABNORMAL HIGH (ref 65–99)
Potassium: 3.8 mmol/L (ref 3.5–5.2)
Sodium: 142 mmol/L (ref 134–144)

## 2019-04-14 LAB — HGB A1C W/O EAG: Hgb A1c MFr Bld: 7.3 % — ABNORMAL HIGH (ref 4.8–5.6)

## 2019-04-14 LAB — MAGNESIUM: Magnesium: 1.8 mg/dL (ref 1.6–2.3)

## 2019-04-14 LAB — DIGOXIN LEVEL: Digoxin, Serum: 0.5 ng/mL (ref 0.5–0.9)

## 2019-04-14 NOTE — Progress Notes (Signed)
Remote ICD transmission.   

## 2019-04-21 ENCOUNTER — Encounter: Payer: Self-pay | Admitting: Internal Medicine

## 2019-04-24 NOTE — Telephone Encounter (Signed)
Appointment made

## 2019-04-24 NOTE — Progress Notes (Signed)
Virtual Visit via Video Note  I connected with [name] on 04/25/19 at  1:30 PM EST by a video enabled telemedicine application and verified that I am speaking with the correct person using two identifiers.   I discussed the limitations of evaluation and management by telemedicine and the availability of in person appointments. The patient expressed understanding and agreed to proceed.  Present for the visit:  Myself, Dr Billey Gosling, [name].  The patient is currently at home and I am in the office.    No referring provider.    History of Present Illness: He is here for follow up of his chronic medical conditions.    He was laid off in April.  He is not exercising regularly.     Diabetes: He is taking his medication daily as prescribed. He is not compliant with a diabetic diet.  He monitors his sugars and they have been running 170.  His recent A1c done by cardiology was 7.3%.  HFrEF, Hypertension: He is taking his medication daily. He is compliant with a low sodium diet.  He denies chest pain, shortness of breath and regular headaches.      Hyperlipidemia: He is taking his medication daily. He is compliant with a low fat/cholesterol diet. He denies myalgias.  [name] GERD:  He is taking his medication daily as prescribed.  He denies any GERD symptoms and feels his GERD is well controlled.   sharp pain in back - coming around to lateral aspect.  It is intermittent.  Pressure on it helps.  It ache at night - has to turn.  He is interested in having this evaluated further.  Hip pain: He is still experiencing hip pain and has not returned to see sports medicine.  Review of Systems  Constitutional: Negative for chills and fever.  Respiratory: Negative for cough, shortness of breath and wheezing.   Cardiovascular: Positive for palpitations (occ) and leg swelling (mild). Negative for chest pain.  Musculoskeletal: Positive for back pain and joint pain.  Neurological: Negative for  dizziness and headaches.     Social History   Socioeconomic History  . Marital status: Married    Spouse name: Not on file  . Number of children: 2  . Years of education: Not on file  . Highest education level: Not on file  Occupational History  . 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 Smoking status: Never Smoker  . Smokeless tobacco: Never Used  Substance and Sexual Activity  . Alcohol use: No  . Drug use: No  . Sexual activity: Not on file  Other Topics Concern  . Not on file  Social History Narrative  . Not on file   Social Determinants of Health   Financial Resource Strain:   . Difficulty of Paying Living Expenses: Not on file  Food Insecurity:   . Worried About Charity fundraiser in the Last Year: Not on file  . Ran Out of Food in the Last Year: Not on file  Transportation Needs:   . Lack of Transportation (Medical): Not on file  . Lack of Transportation (Non-Medical): Not on file  Physical Activity:   . Days of Exercise per Week: Not on file  . Minutes of Exercise per Session: Not on file  Stress:   . Feeling of Stress : Not on file  Social Connections:   . Frequency of Communication with Friends and Family: Not on file  . Frequency of  Social Gatherings with Friends and Family: Not on file  . Attends Religious Services: Not on file  . Active Member of Clubs or Organizations: Not on file  . Attends Archivist Meetings: Not on file  . Marital Status: Not on file     Observations/Objective: Appears well in NAD Breathing normally Skin appears warm and dry  Assessment and Plan:  See Problem List for Assessment and Plan of chronic medical problems.   Follow Up Instructions:    I discussed the assessment and treatment plan with the patient. The patient was provided an opportunity to ask questions and all were answered. The patient agreed with the plan and demonstrated an understanding of the instructions.   The  patient was advised to call back or seek an in-person evaluation if the symptoms worsen or if the condition fails to improve as anticipated.  Follow-up in 6 months  Binnie Rail, MD

## 2019-04-25 ENCOUNTER — Encounter: Payer: Self-pay | Admitting: Internal Medicine

## 2019-04-25 ENCOUNTER — Ambulatory Visit (INDEPENDENT_AMBULATORY_CARE_PROVIDER_SITE_OTHER): Payer: Medicare Other | Admitting: Internal Medicine

## 2019-04-25 DIAGNOSIS — E1151 Type 2 diabetes mellitus with diabetic peripheral angiopathy without gangrene: Secondary | ICD-10-CM

## 2019-04-25 DIAGNOSIS — I10 Essential (primary) hypertension: Secondary | ICD-10-CM

## 2019-04-25 DIAGNOSIS — I5022 Chronic systolic (congestive) heart failure: Secondary | ICD-10-CM

## 2019-04-25 DIAGNOSIS — G8929 Other chronic pain: Secondary | ICD-10-CM

## 2019-04-25 DIAGNOSIS — E7849 Other hyperlipidemia: Secondary | ICD-10-CM

## 2019-04-25 DIAGNOSIS — M25559 Pain in unspecified hip: Secondary | ICD-10-CM

## 2019-04-25 DIAGNOSIS — D649 Anemia, unspecified: Secondary | ICD-10-CM

## 2019-04-25 DIAGNOSIS — M549 Dorsalgia, unspecified: Secondary | ICD-10-CM

## 2019-04-25 DIAGNOSIS — K219 Gastro-esophageal reflux disease without esophagitis: Secondary | ICD-10-CM | POA: Diagnosis not present

## 2019-04-25 HISTORY — DX: Other chronic pain: G89.29

## 2019-04-25 MED ORDER — FARXIGA 5 MG PO TABS: 5.0000 mg | ORAL_TABLET | Freq: Every day | ORAL | 5 refills | Status: DC

## 2019-04-25 NOTE — Assessment & Plan Note (Signed)
Recent A1c 7.3%-not controlled Has not been as compliant with a diabetic diet and is not exercising regularly Will add Farxiga 5 mg daily Continue Janumet at current dose Follow-up A1c in 6 months Work on restarting regular exercise and improving diet

## 2019-04-25 NOTE — Assessment & Plan Note (Signed)
Has been experiencing pain in his back for a while No improvement Refer to sports medicine

## 2019-04-25 NOTE — Assessment & Plan Note (Signed)
Continue atorvastatin

## 2019-04-25 NOTE — Assessment & Plan Note (Signed)
Following with cardiology Sounds to be euvolemic

## 2019-04-25 NOTE — Assessment & Plan Note (Signed)
GERD controlled Continue daily omeprazole

## 2019-04-25 NOTE — Assessment & Plan Note (Signed)
BP Readings from Last 3 Encounters:  03/31/19 (!) 144/76  07/08/18 118/70  07/05/18 138/78   Overall blood pressure control Continue current medications Had recent BMP by cardiology

## 2019-05-01 ENCOUNTER — Other Ambulatory Visit: Payer: Self-pay | Admitting: Internal Medicine

## 2019-05-01 ENCOUNTER — Ambulatory Visit (INDEPENDENT_AMBULATORY_CARE_PROVIDER_SITE_OTHER): Payer: Medicare Other | Admitting: Family Medicine

## 2019-05-01 ENCOUNTER — Other Ambulatory Visit: Payer: Self-pay

## 2019-05-01 ENCOUNTER — Encounter: Payer: Self-pay | Admitting: Family Medicine

## 2019-05-01 VITALS — BP 120/70 | HR 78 | Ht 70.0 in | Wt 250.4 lb

## 2019-05-01 DIAGNOSIS — M545 Low back pain, unspecified: Secondary | ICD-10-CM

## 2019-05-01 DIAGNOSIS — M7062 Trochanteric bursitis, left hip: Secondary | ICD-10-CM

## 2019-05-01 NOTE — Patient Instructions (Signed)
Thank you for coming in today. Use a heating pad.  Use a TENS unit.  Attend PT.  Recheck with me in 4 weeks.  Return sooner if not doing well.  Always ok talk with mychart.   TENS UNIT: This is helpful for muscle pain and spasm.   Search and Purchase a TENS 7000 2nd edition at  www.tenspros.com or www.[employer].com It should be less than $30.     TENS unit instructions: Do not shower or bathe with the unit on Turn the unit off before removing electrodes or batteries If the electrodes lose stickiness add a drop of water to the electrodes after they are disconnected from the unit and place on plastic sheet. If you continued to have difficulty, call the TENS unit company to purchase more electrodes. Do not apply lotion on the skin area prior to use. Make sure the skin is clean and dry as this will help prolong the life of the electrodes. After use, always check skin for unusual red areas, rash or other skin difficulties. If there are any skin problems, does not apply electrodes to the same area. Never remove the electrodes from the unit by pulling the wires. Do not use the TENS unit or electrodes other than as directed. Do not change electrode placement without consultating your therapist or physician. Keep 2 fingers with between each electrode. Wear time ratio is 2:1, on to off times.    For example on for 30 minutes off for 15 minutes and then on for 30 minutes off for 15 minutes

## 2019-05-01 NOTE — Progress Notes (Signed)
I, Wendy Poet, LAT, ATC, am serving as scribe for Dr. Lynne Leader.  [name] is a 68 y.o. male who presents to [facility] today for f/u of low back and L hip pain.  Pt was last seen by Dr. Tamala Julian on 01/06/18 for his L lateral hip at the greater trochanter.  He was provided a HEP and advised to use Pennsaid.  He had a lumbar spine x-ray on 01/06/18.  Since his last visit w/ Dr. Tamala Julian, the pt has seen his PCP who referred him back to Sports Medicine.  Most recently, the pt reports that his symptoms are about the same as when he last saw Dr. Tamala Julian and are intermittent and nature.  He states that he is having B low back pain and L lateral hip pain.  He rates his pain as a 5/10 intermittent, aching pain.  Aggravating factors include prolonged sitting and laying on his side for an extended period of time.  Pt denies any radiating pain or numbness/tingling into his B LEs.  Pt states that he never did his HEP nor is he using any topical gels/creams.    ROS:  As above  Exam:  BP 120/70 (BP Location: Left Arm, Patient Position: Sitting, Cuff Size: Large)   Pulse 78   Ht 5\' 10"  (1.778 m)   Wt 250 lb 6.4 oz (113.6 kg)   SpO2 96%   BMI 35.93 kg/m  Wt Readings from Last 5 Encounters:  05/01/19 250 lb 6.4 oz (113.6 kg)  03/31/19 242 lb (109.8 kg)  07/08/18 229 lb (103.9 kg)  07/05/18 229 lb 9.6 oz (104.1 kg)  02/08/18 225 lb (102.1 kg)   General: Well Developed, well nourished, and in no acute distress.  Neuro/Psych: Alert and oriented x3, extra-ocular muscles intact, able to move all 4 extremities, sensation grossly intact. Skin: Warm and dry, no rashes noted.  Respiratory: Not using accessory muscles, speaking in full sentences, trachea midline.  Cardiovascular: Pulses palpable, no extremity edema. Abdomen: Does not appear distended. MSK:  L-spine: Normal-appearing. Nontender spinal midline. Mildly tender palpation bilateral lumbar paraspinal musculature. Lumbar motion  normal extension.  Limited flexion.  Limited rotation and lateral flexion bilaterally. Lower extremity strength equal normal throughout except for noted below. Reflexes intact bilateral lower extremities. Sensation intact bilateral lower extremities.  Left hip: Normal-appearing Normal motion. Tender palpation greater trochanter. Hip abduction strength diminished 4/5.  External rotation and adduction and internal rotation strength normal. Mild antalgic gait present.    Lab and Radiology Results X-ray images L-spine dated August 2019 reviewed.  Mild degenerative changes present.    Assessment and Plan: 68 y.o. male with acute on chronic intermittent low back pain with left lateral hip pain.  Pain secondary to lumbar myofascial strain and dysfunction.  Fundamentally patient has weak core musculature. Plan to proceed with trial of physical therapy.  Patient was unable to adequately do the exercises on his home during the last episode of this pain.  He will benefit from dedicated physical therapy services.  Additionally recommend heating pad and TENS unit.  Addition patient has left lateral hip pain.  Secondary to hip abductor tendinopathy/greater trochanteric bursitis.  Discussed options.  Again we will treat with physical therapy.  Recheck back in 1 month.  If not better next step will be repeat imaging and greater trochanter injection likely.  May proceed with MRI for facet/epidural steroid injection planning as well in the future.    Orders Placed This Encounter  Procedures  .  Ambulatory referral to Physical Therapy    Referral Priority:   Routine    Referral Type:   Physical Medicine    Referral Reason:   Specialty Services Required    Requested Specialty:   Physical Therapy   No orders of the defined types were placed in this encounter.   Historical information moved to improve visibility of documentation.  Past Medical History:  Diagnosis Date  . AICD (automatic  cardioverter/defibrillator) present   . Anxiety    since defibrillator placement  . Arthritis    knees  . Cataract   . Chronic systolic heart failure ([place])   . Colon polyps    tubular adenoma  . Diabetes mellitus   . Dysrhythmia   . GERD (gastroesophageal reflux disease)   . Hx of myocardial infarction   . Hyperlipidemia   . Hypertension   . NICM (nonischemic cardiomyopathy) ([place])    a.normal cors by cath in 2005. b. s/p Medtronic ICD.  [name] Pacemaker   . Prostate cancer ([place]) 06/2003   s/p prostatectomy  . S/P radiation therapy 10/10/2014 through 11/26/2014    Prostate bed 6600 cGy in 33 sessions   . Sleep apnea   . Ventricular tachycardia ([place])    a. s/p ICD (generator change 09/2012). b. 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 and placed on sotalol   Past Surgical History:  Procedure Laterality Date  . CARDIAC DEFIBRILLATOR PLACEMENT  03/11/2004   Medtronic Maximo single lead  . COLONOSCOPY W/ BIOPSIES AND POLYPECTOMY  08/2004, 02/24/11   76mm adenoma in 2006,  5 mm polyp (not recovered) 2012  . CYSTOSCOPY WITH DIRECT VISION INTERNAL URETHROTOMY N/A 10/08/2015   Procedure: CYSTOSCOPY WITH DIRECT VISION INTERNAL URETHROTOMY;  Surgeon: Nickie Retort, MD;  Location: [facility];  Service: Urology;  Laterality: N/A;  . IMPLANTABLE CARDIOVERTER DEFIBRILLATOR GENERATOR CHANGE N/A 10/07/2012   Procedure: IMPLANTABLE CARDIOVERTER DEFIBRILLATOR GENERATOR CHANGE;  Surgeon: Evans Lance, MD;  Location: [facility] CATH LAB;  Service: Cardiovascular;  Laterality: N/A;  . PROSTATECTOMY  06/2003   Dr Janice Norrie  . RECTAL SURGERY    . UPPER GASTROINTESTINAL ENDOSCOPY  08/2004, 02/24/11   Barrett's esophagus   Social History   Tobacco Use  . Smoking status: Never Smoker  . Smokeless tobacco: Never Used  Substance Use Topics  . Alcohol use: No   family history includes Coronary artery disease in his father; Diabetes in his father and paternal  grandmother; Hypertension in his mother; Prostate cancer in his father.  Medications: Current Outpatient Medications  Medication Sig Dispense Refill  . amLODipine (NORVASC) 5 MG tablet TAKE 1 TABLET BY MOUTH EVERY DAY 90 tablet 1  . aspirin EC 81 MG tablet Take 81 mg by mouth daily.    [name] atorvastatin (LIPITOR) 20 MG tablet Take 1 tablet by mouth daily except 1/2 tablet on Tuesday, Thursday and Saturday. 90 tablet 1  . carvedilol (COREG) 12.5 MG tablet TAKE 1 AND 1/2 TABLETS BY MOUTH TWICE A DAY WITH MEALS (MAX ON INS) 270 tablet 0  . clonazePAM (KLONOPIN) 0.5 MG tablet Take 1 tablet (0.5 mg total) by mouth at bedtime as needed for anxiety. 30 tablet 0  . dapagliflozin propanediol (FARXIGA) 5 MG TABS tablet Take 5 mg by mouth daily before breakfast. 30 tablet 5  . digoxin (LANOXIN) 0.125 MG tablet TAKE 1 TABLET BY MOUTH EVERY DAY 90 tablet 1  . famotidine (PEPCID) 20 MG tablet TAKE 1 TABLET (20 MG TOTAL) BY MOUTH 2 (TWO) TIMES DAILY. NEED OFFICE VISIT FOR  MORE REFILLS 60 tablet 0  . JANUMET 50-1000 MG tablet TAKE 1 TABLET BY MOUTH DAILY WITH SUPPER 90 tablet 0  . Lancets (ONETOUCH ULTRASOFT) lancets Use as instructed to test sugar daily and prn. Dx E11.9 100 each 12  . omeprazole (PRILOSEC) 20 MG capsule TAKE 1 CAPSULE BY MOUTH DAILY. RECOMMENDED TO TAKE 30 MIN BEFORE BREAKFAST. *MAX PER INSURANCE 90 capsule 1  . ONETOUCH ULTRA test strip USE TO CHECK BLOOD SUGAR TWICE DAILY 100 strip 3  . ramipril (ALTACE) 10 MG capsule TAKE 1 CAPSULE (10 MG TOTAL) BY MOUTH 2 (TWO) TIMES DAILY. 180 capsule 0  . sotalol (BETAPACE) 120 MG tablet TAKE 1 TABLET BY MOUTH TWICE A DAY 180 tablet 1  . vitamin B-12 (CYANOCOBALAMIN) 1000 MCG tablet Take 1,000 mcg by mouth daily.     No current facility-administered medications for this visit.   Allergies  Allergen Reactions  . Zantac [Ranitidine Hcl]     itching      Discussed warning signs or symptoms. Please see discharge instructions. Patient expresses  understanding.  The above documentation has been reviewed and is accurate and complete Lynne Leader

## 2019-05-05 ENCOUNTER — Other Ambulatory Visit: Payer: Self-pay | Admitting: Internal Medicine

## 2019-05-07 ENCOUNTER — Other Ambulatory Visit: Payer: Self-pay | Admitting: Internal Medicine

## 2019-05-15 ENCOUNTER — Other Ambulatory Visit: Payer: Self-pay | Admitting: Internal Medicine

## 2019-05-23 ENCOUNTER — Encounter: Payer: Self-pay | Admitting: Physical Therapy

## 2019-05-23 ENCOUNTER — Ambulatory Visit: Payer: Medicare PPO | Attending: Family Medicine | Admitting: Physical Therapy

## 2019-05-23 ENCOUNTER — Other Ambulatory Visit: Payer: Self-pay

## 2019-05-23 DIAGNOSIS — M62830 Muscle spasm of back: Secondary | ICD-10-CM

## 2019-05-23 DIAGNOSIS — M545 Low back pain, unspecified: Secondary | ICD-10-CM

## 2019-05-23 DIAGNOSIS — M25552 Pain in left hip: Secondary | ICD-10-CM | POA: Insufficient documentation

## 2019-05-23 NOTE — Patient Instructions (Signed)
Access Code: [email or web address]  URL: [email or web address]  Date: 05/23/2019  Prepared by: Lum Babe   Exercises Seated Hamstring Stretch with Chair - 5 reps - 1 sets - 30 hold - 2x daily - 7x weekly Supine Piriformis Stretch Pulling Heel to Hip - 5 reps - 1 sets - 30 hold - 2x daily - 7x weekly Hooklying Single Knee to Chest - 10 reps - 1 sets - 10 hold - 2x daily - 7x weekly Supine Lower Trunk Rotation - 10 reps - 1 sets - 10 hold - 2x daily - 7x weekly

## 2019-05-23 NOTE — Therapy (Signed)
[facility] [address] [place], [place], [address] Phone: [phone]   Fax:  [phone]  Physical Therapy Evaluation  Patient Details  Name: [name] MRN: [id number] Date of Birth: 1951/04/13 Referring Provider (PT): Steva Colder   Encounter Date: 05/23/2019  PT End of Session - 05/23/19 1129    Visit Number  1    Number of Visits  12    Date for PT Re-Evaluation  07/22/19    Authorization Type  [employer]    PT Start Time  1050    PT Stop Time  1130    PT Time Calculation (min)  40 min    Activity Tolerance  Patient tolerated treatment well    Behavior During Therapy  [facility] for tasks assessed/performed       Past Medical History:  Diagnosis Date  . AICD (automatic cardioverter/defibrillator) present   . Anxiety    since defibrillator placement  . Arthritis    knees  . Cataract   . Chronic systolic heart failure ([place])   . Colon polyps    tubular adenoma  . Diabetes mellitus   . Dysrhythmia   . GERD (gastroesophageal reflux disease)   . Hx of myocardial infarction   . Hyperlipidemia   . Hypertension   . NICM (nonischemic cardiomyopathy) ([place])    a.normal cors by cath in 2005. b. s/p Medtronic ICD.  [name] Pacemaker   . Prostate cancer ([place]) 06/2003   s/p prostatectomy  . S/P radiation therapy 10/10/2014 through 11/26/2014    Prostate bed 6600 cGy in 33 sessions   . Sleep apnea   . Ventricular tachycardia ([place])    a. s/p ICD (generator change 09/2012). b. 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 and placed on sotalol    Past Surgical History:  Procedure Laterality Date  . CARDIAC DEFIBRILLATOR PLACEMENT  03/11/2004   Medtronic Maximo single lead  . COLONOSCOPY W/ BIOPSIES AND POLYPECTOMY  08/2004, 02/24/11   59mm adenoma in 2006,  5 mm polyp (not recovered) 2012  . CYSTOSCOPY WITH DIRECT VISION INTERNAL URETHROTOMY N/A 10/08/2015   Procedure: CYSTOSCOPY  WITH DIRECT VISION INTERNAL URETHROTOMY;  Surgeon: Nickie Retort, MD;  Location: [facility];  Service: Urology;  Laterality: N/A;  . IMPLANTABLE CARDIOVERTER DEFIBRILLATOR GENERATOR CHANGE N/A 10/07/2012   Procedure: IMPLANTABLE CARDIOVERTER DEFIBRILLATOR GENERATOR CHANGE;  Surgeon: Evans Lance, MD;  Location: [facility] CATH LAB;  Service: Cardiovascular;  Laterality: N/A;  . PROSTATECTOMY  06/2003   Dr Janice Norrie  . RECTAL SURGERY    . UPPER GASTROINTESTINAL ENDOSCOPY  08/2004, 02/24/11   Barrett's esophagus    There were no vitals filed for this visit.   Subjective Assessment - 05/23/19 1058    Subjective  Patient reports that he has had  LBP for a number of years, he reports that since he was laid off of work in March and since then he has had increased LBP and hip pain, due to inactivity.  X-rays show some DDD and arthritis    Pertinent History  has a Pacemaker    Limitations  Sitting;Lifting;Standing;Walking    Patient Stated Goals  have less pain    Currently in Pain?  Yes    Pain Score  0-No pain    Pain Location  Back   some pain into the hips   Pain Orientation  Lower    Pain Descriptors / Indicators  Aching;Tightness    Pain Type  Chronic pain    Pain Radiating  Towards  pain in the hips    Pain Onset  More than a month ago    Pain Frequency  Intermittent    Aggravating Factors   sitting long periods pain can be up to 10./10, walking will cause the hips to hurt    Pain Relieving Factors  change of position, a lumbar support helps and pain can be 0/10    Effect of Pain on Daily Activities  just hurts         [facility] PT Assessment - 05/23/19 0001      Assessment   Medical Diagnosis  LBP with hip pain    Referring Provider (PT)  Steva Colder    Onset Date/Surgical Date  04/22/19    Prior Therapy  no      Precautions   Precautions  ICD/Pacemaker      Balance Screen   Has the patient fallen in the past 6 months  No    Has the patient had a decrease in activity level because of a  fear of falling?   No    Is the patient reluctant to leave their home because of a fear of falling?   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, on his feet a lot     Leisure  no exercise      Posture/Postural Control   Posture Comments  very poor posture, very slouched sitting      ROM / Strength   AROM / PROM / Strength  AROM;Strength      AROM   Overall AROM Comments  Lumbar ROM decreased 50% with tightness int he legs and the low back      Strength   Overall Strength Comments  Hips and knees 4-/5 with light low back pain      Flexibility   Soft Tissue Assessment /Muscle Length  yes    Hamstrings  SLR to 40 degrees with tightness    Piriformis  very tight iwth some pain      Palpation   Palpation comment  very tight in the lumbar mms, tight in the buttocks and the ITB, tender of the GT areas      Ambulation/Gait   Gait Comments  stooped posture, limps on the left                Objective measurements completed on examination: See above findings.                PT Short Term Goals - 05/23/19 1137      PT SHORT TERM GOAL #1   Title  independent with initial HEP    Time  2    Period  Weeks    Status  New        PT Long Term Goals - 05/23/19 1138      PT LONG TERM GOAL #1   Title  understand posture and body mechanics    Time  8    Period  Weeks    Status  New      PT LONG TERM GOAL #2   Title  decrease paiun 50%    Time  8    Period  Weeks    Status  New      PT  LONG TERM GOAL #3   Title  increase lumbar ROM 25%    Time  8    Period  Weeks    Status  New      PT LONG TERM GOAL #4   Title  increase SLR to 65 degrees    Time  8    Period  Weeks    Status  New             Plan - 05/23/19 1132    Clinical Impression Statement  Patient reports  that he has had a long history of LBP, he reports that he had been doing well until Covid came and he was laid off, he reports that he has not been very active and is now hurting more in the low back and the hips.  He has very poor posture, very slouched.  He is extremely tight in the HS and the piriformis with pain, SLR to 40 degrees.  He has lost LE strength.  Has a pacemaker    Stability/Clinical Decision Making  Stable/Uncomplicated    Clinical Decision Making  Low    Rehab Potential  Good    PT Frequency  2x / week    PT Duration  6 weeks    PT Treatment/Interventions  ADLs/Self Care Home Management;Iontophoresis 4mg /ml Dexamethasone;Moist Heat;Traction;Therapeutic exercise;Therapeutic activities;Patient/family education;Manual techniques    PT Next Visit Plan  start gym activities, go over HEP for flexibility, start education about posture    Consulted and Agree with Plan of Care  Patient       Patient will benefit from skilled therapeutic intervention in order to improve the following deficits and impairments:  Abnormal gait, Pain, Improper body mechanics, Increased muscle spasms, Postural dysfunction, Decreased strength, Decreased range of motion, Decreased activity tolerance, Difficulty walking, Impaired flexibility  Visit Diagnosis: Acute bilateral low back pain without sciatica - Plan: PT plan of care cert/re-cert  Pain in left hip - Plan: PT plan of care cert/re-cert  Muscle spasm of back - Plan: PT plan of care cert/re-cert     Problem List Patient Active Problem List   Diagnosis Date Noted  . Chronic back pain 04/25/2019  . Dysuria 10/19/2018  . Axillary pain, left 10/19/2018  . Neuralgia 10/19/2018  . Acute cystitis without hematuria 09/05/2018  . Chronic left-sided thoracic back pain 08/23/2018  . Muscle cramping 07/08/2018  . Right foot pain 12/15/2017  . Hip pain 12/14/2016  . Left knee pain 12/14/2016  . Shingles 07/28/2016  . GERD (gastroesophageal reflux  disease) 07/29/2015  . Malignant neoplasm of prostate ([place]) 09/27/2014  . RLS (restless legs syndrome) 11/29/2012  . Obstructive sleep apnea 11/10/2012  . Elevated alkaline phosphatase level 10/04/2012  . Vitamin B 12 deficiency 03/06/2011  . Chronic systolic heart failure ([place]) 01/27/2011  . Mild anemia 01/20/2011  . Ventricular tachycardia ([place])   . NICM (nonischemic cardiomyopathy) ([place])   . Diabetes ([place]) 08/26/2009  . Hyperlipidemia 08/26/2009  . Essential hypertension 08/26/2009  . PROSTATE CANCER, HX OF 08/26/2009  . 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  . Personal history of adenomatous colonic polyps 09/05/2004  . Barrett's esophagus 09/05/2004    Sumner Boast., PT 05/23/2019, 11:42 AM  [place] [address] [place], [place], [address] Phone: [phone]   Fax:  [phone]  Name: [name] MRN: [id number] Date of Birth: 06-May-1951

## 2019-05-24 ENCOUNTER — Ambulatory Visit: Payer: Medicare Other | Attending: Internal Medicine

## 2019-05-24 DIAGNOSIS — Z23 Encounter for immunization: Secondary | ICD-10-CM | POA: Insufficient documentation

## 2019-05-24 NOTE — Progress Notes (Signed)
   Covid-19 Vaccination Clinic  Name:  [name]    MRN: [id number] DOB: [birth year]  05/24/2019  [name] was observed post Covid-19 immunization for 30 minutes based on pre-vaccination screening without incidence. He was provided with Vaccine Information Sheet and instruction to access the V-Safe system.   [name] was instructed to call 911 with any severe reactions post vaccine: [name] Difficulty breathing  . Swelling of your face and throat  . A fast heartbeat  . A bad rash all over your body  . Dizziness and weakness    Immunizations Administered    Name Date Dose VIS Date Route   Pfizer COVID-19 Vaccine 05/24/2019 11:10 AM 0.3 mL 04/21/2019 Intramuscular   Manufacturer: [employer], [employer]   Lot: [id number]   [place]: [id number]

## 2019-05-29 ENCOUNTER — Encounter: Payer: Self-pay | Admitting: Internal Medicine

## 2019-06-01 ENCOUNTER — Encounter: Payer: Self-pay | Admitting: Internal Medicine

## 2019-06-02 ENCOUNTER — Encounter: Payer: Self-pay | Admitting: Internal Medicine

## 2019-06-04 ENCOUNTER — Encounter: Payer: Self-pay | Admitting: Internal Medicine

## 2019-06-04 MED ORDER — JANUMET 50-1000 MG PO TABS: 1.0000 | ORAL_TABLET | Freq: Two times a day (BID) | ORAL | 1 refills | Status: DC

## 2019-06-13 ENCOUNTER — Ambulatory Visit: Payer: Medicare PPO | Attending: Internal Medicine

## 2019-06-13 DIAGNOSIS — Z23 Encounter for immunization: Secondary | ICD-10-CM | POA: Insufficient documentation

## 2019-06-13 NOTE — Progress Notes (Signed)
   Covid-19 Vaccination Clinic  Name:  [name]    MRN: [id number] DOB: 16-Jan-1951  06/13/2019  [name] was observed post Covid-19 immunization for 15 minutes without incidence. He was provided with Vaccine Information Sheet and instruction to access the V-Safe system.   [name] was instructed to call 911 with any severe reactions post vaccine: [name] Difficulty breathing  . Swelling of your face and throat  . A fast heartbeat  . A bad rash all over your body  . Dizziness and weakness    Immunizations Administered    Name Date Dose VIS Date Route   Pfizer COVID-19 Vaccine 06/13/2019 10:24 AM 0.3 mL 04/21/2019 Intramuscular   Manufacturer: [employer]   Lot: [id number]   [place]: [id number]

## 2019-06-21 ENCOUNTER — Encounter: Payer: Self-pay | Admitting: Internal Medicine

## 2019-06-21 MED ORDER — JANUMET 50-1000 MG PO TABS: 1.0000 | ORAL_TABLET | Freq: Every day | ORAL | 1 refills | Status: DC

## 2019-06-22 ENCOUNTER — Ambulatory Visit (INDEPENDENT_AMBULATORY_CARE_PROVIDER_SITE_OTHER): Payer: Medicare PPO | Admitting: *Deleted

## 2019-06-22 DIAGNOSIS — I5022 Chronic systolic (congestive) heart failure: Secondary | ICD-10-CM | POA: Diagnosis not present

## 2019-06-22 LAB — CUP PACEART REMOTE DEVICE CHECK
Battery Remaining Longevity: 63 mo
Battery Voltage: 2.99 V
Brady Statistic RV Percent Paced: 0.01 %
Date Time Interrogation Session: 20210211072410
HighPow Impedance: 68 Ohm
Implantable Lead Implant Date: 20140530
Implantable Lead Location: 753860
Implantable Lead Model: 6935
Implantable Pulse Generator Implant Date: 20140530
Lead Channel Impedance Value: 399 Ohm
Lead Channel Impedance Value: 513 Ohm
Lead Channel Pacing Threshold Amplitude: 0.5 V
Lead Channel Pacing Threshold Pulse Width: 0.4 ms
Lead Channel Sensing Intrinsic Amplitude: 7.25 mV
Lead Channel Sensing Intrinsic Amplitude: 7.25 mV
Lead Channel Setting Pacing Amplitude: 2.5 V
Lead Channel Setting Pacing Pulse Width: 0.4 ms
Lead Channel Setting Sensing Sensitivity: 0.3 mV

## 2019-06-23 NOTE — Progress Notes (Signed)
ICD Remote  

## 2019-07-10 ENCOUNTER — Telehealth: Payer: Self-pay

## 2019-07-10 MED ORDER — BLOOD GLUCOSE MONITOR KIT: PACK | 0 refills | Status: DC

## 2019-07-10 NOTE — Telephone Encounter (Signed)
F/u  The patient calling back to states [employer] is asking for an accu-check machine kit, lancet,  

## 2019-07-10 NOTE — Telephone Encounter (Signed)
Patient calling and states that he is needing a new blood glucose machine. States that the pharmacy was supposed to reach out and request this, but pharmacy has not heard anything back. Please advise. Does not have a preference of machine. States that it is the newest one that just came out, but does not know that name of it.

## 2019-07-11 MED ORDER — BLOOD GLUCOSE MONITOR KIT: PACK | 0 refills | Status: DC

## 2019-07-11 NOTE — Telephone Encounter (Signed)
Glucometer sent to local pharmacy per pts request.

## 2019-07-11 NOTE — Addendum Note (Signed)
Addended by: Delice Bison E on: 07/11/2019 08:46 AM   Modules accepted: Orders

## 2019-07-29 ENCOUNTER — Other Ambulatory Visit: Payer: Self-pay | Admitting: Internal Medicine

## 2019-07-30 ENCOUNTER — Other Ambulatory Visit: Payer: Self-pay | Admitting: Internal Medicine

## 2019-08-19 ENCOUNTER — Other Ambulatory Visit: Payer: Self-pay | Admitting: Internal Medicine

## 2019-08-23 NOTE — Progress Notes (Signed)
Subjective:    Patient ID: [name], male    DOB: 1951/02/16, 69 y.o.   MRN: [id number]  HPI The patient is here for an acute visit.   His symptoms just started recently.  He states urinary frequency.  He has been getting up to go to the bathroom 10 times at night, which is very unusual.  He also states some dysuria.  He does have a history of urinary tract infections.  He states he needs to follow-up with urology because there is some issues with him urinating and they need to "clean out the area".  He has a history of prostate cancer and had a TURP with radiation.   Medications and allergies reviewed with patient and updated if appropriate.  Patient Active Problem List   Diagnosis Date Noted  . Chronic back pain 04/25/2019  . Dysuria 10/19/2018  . Axillary pain, left 10/19/2018  . Neuralgia 10/19/2018  . Acute cystitis without hematuria 09/05/2018  . Chronic left-sided thoracic back pain 08/23/2018  . Muscle cramping 07/08/2018  . Right foot pain 12/15/2017  . Hip pain 12/14/2016  . Left knee pain 12/14/2016  . Shingles 07/28/2016  . GERD (gastroesophageal reflux disease) 07/29/2015  . Malignant neoplasm of prostate ([place]) 09/27/2014  . RLS (restless legs syndrome) 11/29/2012  . Obstructive sleep apnea 11/10/2012  . Elevated alkaline phosphatase level 10/04/2012  . Vitamin B 12 deficiency 03/06/2011  . Chronic systolic heart failure ([place]) 01/27/2011  . Mild anemia 01/20/2011  . Ventricular tachycardia ([place])   . NICM (nonischemic cardiomyopathy) ([place])   . Diabetes ([place]) 08/26/2009  . Hyperlipidemia 08/26/2009  . Essential hypertension 08/26/2009  . PROSTATE CANCER, HX OF 08/26/2009  . 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  . Personal history of adenomatous colonic polyps 09/05/2004  . Barrett's esophagus 09/05/2004    Current Outpatient Medications on File Prior to Visit  Medication Sig Dispense Refill  . ACCU-CHEK GUIDE test  strip USE TO CHECK SUGAR UP TO 4 TIMES DAILY E11.9 100 strip 3  . amLODipine (NORVASC) 5 MG tablet TAKE 1 TABLET BY MOUTH EVERY DAY 90 tablet 1  . aspirin EC 81 MG tablet Take 81 mg by mouth daily.    [name] atorvastatin (LIPITOR) 20 MG tablet Take 1 tablet by mouth daily except 1/2 tablet on Tuesday, Thursday and Saturday. 90 tablet 1  . blood glucose meter kit and supplies KIT Dispense based on patient and insurance preference. Use up to four times daily as directed. (Dx code- E11.9) 1 each 0  . carvedilol (COREG) 12.5 MG tablet TAKE 1 AND 1/2 TABLETS BY MOUTH TWICE A DAY WITH MEALS (MAX ON INS) 270 tablet 1  . clonazePAM (KLONOPIN) 0.5 MG tablet Take 1 tablet (0.5 mg total) by mouth at bedtime as needed for anxiety. 30 tablet 0  . dapagliflozin propanediol (FARXIGA) 5 MG TABS tablet Take 5 mg by mouth daily before breakfast. 30 tablet 5  . digoxin (LANOXIN) 0.125 MG tablet TAKE 1 TABLET BY MOUTH EVERY DAY 90 tablet 1  . famotidine (PEPCID) 20 MG tablet Take 1 tablet (20 mg total) by mouth 2 (two) times daily. 60 tablet 5  . Lancets (ONETOUCH ULTRASOFT) lancets Use as instructed to test sugar daily and prn. Dx E11.9 100 each 12  . omeprazole (PRILOSEC) 20 MG capsule TAKE 1 CAPSULE BY MOUTH DAILY. RECOMMENDED TO TAKE 30 MIN BEFORE BREAKFAST. *MAX PER INSURANCE 90 capsule 1  . ramipril (ALTACE) 10 MG capsule TAKE 1 CAPSULE (10  MG TOTAL) BY MOUTH 2 (TWO) TIMES DAILY. 180 capsule 1  . sitaGLIPtin-metformin (JANUMET) 50-1000 MG tablet Take 1 tablet by mouth daily with supper. 90 tablet 1  . sotalol (BETAPACE) 120 MG tablet TAKE 1 TABLET BY MOUTH TWICE A DAY 180 tablet 1  . vitamin B-12 (CYANOCOBALAMIN) 1000 MCG tablet Take 1,000 mcg by mouth daily.     No current facility-administered medications on file prior to visit.    Past Medical History:  Diagnosis Date  . AICD (automatic cardioverter/defibrillator) present   . Anxiety    since defibrillator placement  . Arthritis    knees  . Cataract   .  Chronic systolic heart failure ([place])   . Colon polyps    tubular adenoma  . Diabetes mellitus   . Dysrhythmia   . GERD (gastroesophageal reflux disease)   . Hx of myocardial infarction   . Hyperlipidemia   . Hypertension   . NICM (nonischemic cardiomyopathy) ([place])    a.normal cors by cath in 2005. b. s/p Medtronic ICD.  [name] Pacemaker   . Prostate cancer ([place]) 06/2003   s/p prostatectomy  . S/P radiation therapy 10/10/2014 through 11/26/2014    Prostate bed 6600 cGy in 33 sessions   . Sleep apnea   . Ventricular tachycardia ([place])    a. s/p ICD (generator change 09/2012). b. 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 and placed on sotalol    Past Surgical History:  Procedure Laterality Date  . CARDIAC DEFIBRILLATOR PLACEMENT  03/11/2004   Medtronic Maximo single lead  . COLONOSCOPY W/ BIOPSIES AND POLYPECTOMY  08/2004, 02/24/11   25m adenoma in 2006,  5 mm polyp (not recovered) 2012  . CYSTOSCOPY WITH DIRECT VISION INTERNAL URETHROTOMY N/A 10/08/2015   Procedure: CYSTOSCOPY WITH DIRECT VISION INTERNAL URETHROTOMY;  Surgeon: BNickie Retort MD;  Location: [facility];  Service: Urology;  Laterality: N/A;  . IMPLANTABLE CARDIOVERTER DEFIBRILLATOR GENERATOR CHANGE N/A 10/07/2012   Procedure: IMPLANTABLE CARDIOVERTER DEFIBRILLATOR GENERATOR CHANGE;  Surgeon: GEvans Lance MD;  [facility];  Service: Cardiovascular;  Laterality: N/A;  . PROSTATECTOMY  06/2003   Dr NJanice Norrie . RECTAL SURGERY    . UPPER GASTROINTESTINAL ENDOSCOPY  08/2004, 02/24/11   Barrett's esophagus    Social History   Socioeconomic History  . Marital status: Married    Spouse name: Not on file  . Number of children: 2  . Years of education: Not on file  . Highest education level: Not on file  Occupational History  . 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 Smoking status: Never Smoker  . Smokeless tobacco: Never Used  Substance and  Sexual Activity  . Alcohol use: No  . Drug use: No  . Sexual activity: Not on file  Other Topics Concern  . Not on file  Social History Narrative  . Not on file   Social Determinants of Health   Financial Resource Strain:   . Difficulty of Paying Living Expenses:   Food Insecurity:   . Worried About RCharity fundraiserin the Last Year:   . RArboriculturistin the Last Year:   Transportation Needs:   . LFilm/video editor(Medical):   .Marland KitchenLack of Transportation (Non-Medical):   Physical Activity:   . Days of Exercise per Week:   . Minutes of Exercise per Session:   Stress:   . Feeling of Stress :   Social Connections:   . Frequency of Communication with  Friends and Family:   . Frequency of Social Gatherings with Friends and Family:   . Attends Religious Services:   . Active Member of Clubs or Organizations:   . Attends Archivist Meetings:   [name] Marital Status:     Family History  Problem Relation Age of Onset  . Coronary artery disease Father   . Prostate cancer Father   . Diabetes Father   . Hypertension Mother   . Diabetes Paternal Grandmother   . Stroke Neg Hx     Review of Systems  Constitutional: Negative for chills and fever.  Genitourinary: Positive for difficulty urinating, dysuria and frequency. Negative for hematuria.       No change in color or odor       Objective:   Vitals:   08/24/19 1110  BP: 134/78  Pulse: 75  Resp: 16  Temp: 98 F (36.7 C)  SpO2: 96%   BP Readings from Last 3 Encounters:  08/24/19 134/78  05/01/19 120/70  03/31/19 (!) 144/76   Wt Readings from Last 3 Encounters:  08/24/19 237 lb (107.5 kg)  05/01/19 250 lb 6.4 oz (113.6 kg)  03/31/19 242 lb (109.8 kg)   Body mass index is 34.01 kg/m.   Physical Exam Constitutional:      General: He is not in acute distress.    Appearance: Normal appearance. He is not ill-appearing.  HENT:     Head: Normocephalic and atraumatic.  Abdominal:     General: There is  no distension.     Palpations: Abdomen is soft.     Tenderness: There is no abdominal tenderness. There is no right CVA tenderness or left CVA tenderness.  Skin:    General: Skin is warm and dry.  Neurological:     Mental Status: He is alert.            Assessment & Plan:    See Problem List for Assessment and Plan of chronic medical problems.    This visit occurred during the SARS-CoV-2 public health emergency.  Safety protocols were in place, including screening questions prior to the visit, additional usage of staff PPE, and extensive cleaning of exam room while observing appropriate contact time as indicated for disinfecting solutions.

## 2019-08-24 ENCOUNTER — Encounter: Payer: Self-pay | Admitting: Internal Medicine

## 2019-08-24 ENCOUNTER — Other Ambulatory Visit: Payer: Self-pay

## 2019-08-24 ENCOUNTER — Ambulatory Visit: Payer: Medicare PPO | Admitting: Internal Medicine

## 2019-08-24 VITALS — BP 134/78 | HR 75 | Temp 98.0°F | Resp 16 | Ht 70.0 in | Wt 237.0 lb

## 2019-08-24 DIAGNOSIS — R350 Frequency of micturition: Secondary | ICD-10-CM | POA: Diagnosis not present

## 2019-08-24 DIAGNOSIS — N3000 Acute cystitis without hematuria: Secondary | ICD-10-CM | POA: Diagnosis not present

## 2019-08-24 LAB — POCT URINALYSIS DIPSTICK
Bilirubin, UA: NEGATIVE
Blood, UA: NEGATIVE
Glucose, UA: NEGATIVE
Ketones, UA: NEGATIVE
Nitrite, UA: NEGATIVE
Protein, UA: NEGATIVE
Spec Grav, UA: 1.025 (ref 1.010–1.025)
Urobilinogen, UA: NEGATIVE E.U./dL — AB
pH, UA: 6 (ref 5.0–8.0)

## 2019-08-24 MED ORDER — CIPROFLOXACIN HCL 500 MG PO TABS: 500.0000 mg | ORAL_TABLET | Freq: Two times a day (BID) | ORAL | 0 refills | Status: DC

## 2019-08-24 NOTE — Assessment & Plan Note (Signed)
Urine dip consistent with probable UTI Will send urine for culture Take the antibiotic as prescribed.  Cipro 500 mg twice daily x10 days Take tylenol if needed.   Increase your water intake.  Call if no improvement

## 2019-08-24 NOTE — Patient Instructions (Signed)
Take the antibiotic as prescribed.  Take tylenol if needed.     Increase your water intake.   Call if no improvement    Please call if there is no improvement in your symptoms.    Urinary Tract Infection, Adult A urinary tract infection (UTI) is an infection of any part of the urinary tract, which includes the kidneys, ureters, bladder, and urethra. These organs make, store, and get rid of urine in the body. UTI can be a bladder infection (cystitis) or kidney infection (pyelonephritis). What are the causes? This infection may be caused by fungi, viruses, or bacteria. Bacteria are the most common cause of UTIs. This condition can also be caused by repeated incomplete emptying of the bladder during urination. What increases the risk? This condition is more likely to develop if:  You ignore your need to urinate or hold urine for long periods of time.  You do not empty your bladder completely during urination.  You wipe back to front after urinating or having a bowel movement, if you are male.  You are uncircumcised, if you are male.  You are constipated.  You have a urinary catheter that stays in place (indwelling).  You have a weak defense (immune) system.  You have a medical condition that affects your bowels, kidneys, or bladder.  You have diabetes.  You take antibiotic medicines frequently or for long periods of time, and the antibiotics no longer work well against certain types of infections (antibiotic resistance).  You take medicines that irritate your urinary tract.  You are exposed to chemicals that irritate your urinary tract.  You are male.  What are the signs or symptoms? Symptoms of this condition include:  Fever.  Frequent urination or passing small amounts of urine frequently.  Needing to urinate urgently.  Pain or burning with urination.  Urine that smells bad or unusual.  Cloudy urine.  Pain in the lower abdomen or back.  Trouble  urinating.  Blood in the urine.  Vomiting or being less hungry than normal.  Diarrhea or abdominal pain.  Vaginal discharge, if you are male.  How is this diagnosed? This condition is diagnosed with a medical history and physical exam. You will also need to provide a urine sample to test your urine. Other tests may be done, including:  Blood tests.  Sexually transmitted disease (STD) testing.  If you have had more than one UTI, a cystoscopy or imaging studies may be done to determine the cause of the infections. How is this treated? Treatment for this condition often includes a combination of two or more of the following:  Antibiotic medicine.  Other medicines to treat less common causes of UTI.  Over-the-counter medicines to treat pain.  Drinking enough water to stay hydrated.  Follow these instructions at home:  Take over-the-counter and prescription medicines only as told by your health care provider.  If you were prescribed an antibiotic, take it as told by your health care provider. Do not stop taking the antibiotic even if you start to feel better.  Avoid alcohol, caffeine, tea, and carbonated beverages. They can irritate your bladder.  Drink enough fluid to keep your urine clear or pale yellow.  Keep all follow-up visits as told by your health care provider. This is important.  Make sure to: ? Empty your bladder often and completely. Do not hold urine for long periods of time. ? Empty your bladder before and after sex. ? Wipe from front to back after a bowel  movement if you are male. Use each tissue one time when you wipe. Contact a health care provider if:  You have back pain.  You have a fever.  You feel nauseous or vomit.  Your symptoms do not get better after 3 days.  Your symptoms go away and then return. Get help right away if:  You have severe back pain or lower abdominal pain.  You are vomiting and cannot keep down any medicines or  water. This information is not intended to replace advice given to you by your health care provider. Make sure you discuss any questions you have with your health care provider. Document Released: 02/04/2005 Document Revised: 10/09/2015 Document Reviewed: 03/18/2015 Elsevier Interactive Patient Education  [employer].

## 2019-08-25 LAB — URINE CULTURE

## 2019-08-27 ENCOUNTER — Encounter: Payer: Self-pay | Admitting: Internal Medicine

## 2019-09-02 ENCOUNTER — Other Ambulatory Visit: Payer: Self-pay | Admitting: Internal Medicine

## 2019-09-11 ENCOUNTER — Encounter: Payer: Self-pay | Admitting: Internal Medicine

## 2019-09-21 ENCOUNTER — Ambulatory Visit (INDEPENDENT_AMBULATORY_CARE_PROVIDER_SITE_OTHER): Payer: Medicare PPO | Admitting: *Deleted

## 2019-09-21 DIAGNOSIS — I428 Other cardiomyopathies: Secondary | ICD-10-CM | POA: Diagnosis not present

## 2019-09-21 LAB — CUP PACEART REMOTE DEVICE CHECK
Battery Remaining Longevity: 65 mo
Battery Voltage: 2.99 V
Brady Statistic RV Percent Paced: 0.01 %
Date Time Interrogation Session: 20210513001806
HighPow Impedance: 65 Ohm
Implantable Lead Implant Date: 20140530
Implantable Lead Location: 753860
Implantable Lead Model: 6935
Implantable Pulse Generator Implant Date: 20140530
Lead Channel Impedance Value: 418 Ohm
Lead Channel Impedance Value: 475 Ohm
Lead Channel Pacing Threshold Amplitude: 0.5 V
Lead Channel Pacing Threshold Pulse Width: 0.4 ms
Lead Channel Sensing Intrinsic Amplitude: 6.25 mV
Lead Channel Sensing Intrinsic Amplitude: 6.25 mV
Lead Channel Setting Pacing Amplitude: 2.5 V
Lead Channel Setting Pacing Pulse Width: 0.4 ms
Lead Channel Setting Sensing Sensitivity: 0.3 mV

## 2019-09-25 NOTE — Progress Notes (Signed)
Remote ICD transmission.   

## 2019-10-11 ENCOUNTER — Telehealth: Payer: Self-pay | Admitting: *Deleted

## 2019-10-11 NOTE — Telephone Encounter (Signed)
Dr Carlean Purl,  This pt is scheduled for an overdue colon 6-29- he had an [place] 2012-  He has ahx of TA polyps, 2012 Barrett's with his EGD with a 3-5 yr recall- he has not had any other GI procedures since 2012- he is ONLY scheduled for a colon 6-29.  His last EF 35-40 %, he has a pacemaker, hx MI,-  GERD, DM, OSA , prostate CA   Do you want him to have an OV ?  Do you want to schedule him for an ECL and not just a colon due to 2012 Barrett's?  Please advise, thanks  Lelan Pons

## 2019-10-11 NOTE — Telephone Encounter (Signed)
Thanks for this  Recs:  EGD and colonoscopy 6/29 - grab the 10 AM EGd spot it is open  OK for direct as his EF is at or more than minimum  CEG

## 2019-10-16 NOTE — Progress Notes (Addendum)
  This encounter was created in error - please disregard. Patient was left several messages to return call to office to do AWV.

## 2019-10-16 NOTE — Patient Instructions (Addendum)
  Blood work was ordered.   ° ° °Medications reviewed and updated.  Changes include :   none ° ° ° °Please followup in 6 months ° ° °

## 2019-10-16 NOTE — Progress Notes (Signed)
Subjective:    Patient ID: [name], male    DOB: 1951-02-10, 69 y.o.   MRN: [id number]  HPI The patient is here for follow up of their chronic medical problems, including diabetes, HFrEF, htn, hyperlipidemia, GERD, RLS  He is working some and very active at work. He is compliant with a diabetic diet.  He has eaten less over the past few months and has lost some weight.  Medications and allergies reviewed with patient and updated if appropriate.  Patient Active Problem List   Diagnosis Date Noted  . Chronic back pain 04/25/2019  . Dysuria 10/19/2018  . Axillary pain, left 10/19/2018  . Neuralgia 10/19/2018  . Acute cystitis without hematuria 09/05/2018  . Chronic left-sided thoracic back pain 08/23/2018  . Muscle cramping 07/08/2018  . Right foot pain 12/15/2017  . Hip pain 12/14/2016  . Left knee pain 12/14/2016  . Shingles 07/28/2016  . GERD (gastroesophageal reflux disease) 07/29/2015  . Malignant neoplasm of prostate ([place]) 09/27/2014  . RLS (restless legs syndrome) 11/29/2012  . Obstructive sleep apnea 11/10/2012  . Elevated alkaline phosphatase level 10/04/2012  . Vitamin B 12 deficiency 03/06/2011  . Chronic systolic heart failure ([place]) 01/27/2011  . Mild anemia 01/20/2011  . Ventricular tachycardia ([place])   . NICM (nonischemic cardiomyopathy) ([place])   . Diabetes ([place]) 08/26/2009  . Hyperlipidemia 08/26/2009  . Essential hypertension 08/26/2009  . PROSTATE CANCER, HX OF 08/26/2009  . 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  . Personal history of adenomatous colonic polyps 09/05/2004  . Barrett's esophagus 09/05/2004    Current Outpatient Medications on File Prior to Visit  Medication Sig Dispense Refill  . ACCU-CHEK GUIDE test strip USE TO CHECK SUGAR UP TO 4 TIMES DAILY E11.9 100 strip 3  . amLODipine (NORVASC) 5 MG tablet TAKE 1 TABLET BY MOUTH EVERY DAY 90 tablet 1  . aspirin EC 81 MG tablet Take 81 mg by mouth daily.    [name]  atorvastatin (LIPITOR) 20 MG tablet Take 1 tablet by mouth daily except 1/2 tablet on Tuesday, Thursday and Saturday. 90 tablet 1  . blood glucose meter kit and supplies KIT Dispense based on patient and insurance preference. Use up to four times daily as directed. (Dx code- E11.9) 1 each 0  . carvedilol (COREG) 12.5 MG tablet TAKE 1 AND 1/2 TABLETS BY MOUTH TWICE A DAY WITH MEALS (MAX ON INS) 270 tablet 1  . clonazePAM (KLONOPIN) 0.5 MG tablet Take 1 tablet (0.5 mg total) by mouth at bedtime as needed for anxiety. 30 tablet 0  . digoxin (LANOXIN) 0.125 MG tablet TAKE 1 TABLET BY MOUTH EVERY DAY 90 tablet 1  . famotidine (PEPCID) 20 MG tablet Take 1 tablet (20 mg total) by mouth 2 (two) times daily. 60 tablet 5  . Lancets (ONETOUCH ULTRASOFT) lancets Use as instructed to test sugar daily and prn. Dx E11.9 100 each 12  . omeprazole (PRILOSEC) 20 MG capsule TAKE 1 CAPSULE BY MOUTH DAILY. RECOMMENDED TO TAKE 30 MIN BEFORE BREAKFAST. *MAX PER INSURANCE 90 capsule 1  . ramipril (ALTACE) 10 MG capsule TAKE 1 CAPSULE (10 MG TOTAL) BY MOUTH 2 (TWO) TIMES DAILY. 180 capsule 1  . sitaGLIPtin-metformin (JANUMET) 50-1000 MG tablet Take 1 tablet by mouth daily with supper. 90 tablet 1  . sotalol (BETAPACE) 120 MG tablet TAKE 1 TABLET BY MOUTH TWICE A DAY 180 tablet 1  . vitamin B-12 (CYANOCOBALAMIN) 1000 MCG tablet Take 1,000 mcg by mouth daily.    [name]  dapagliflozin propanediol (FARXIGA) 5 MG TABS tablet Take 5 mg by mouth daily before breakfast. (Patient not taking: Reported on 10/17/2019) 30 tablet 5   No current facility-administered medications on file prior to visit.    Past Medical History:  Diagnosis Date  . AICD (automatic cardioverter/defibrillator) present   . Anxiety    since defibrillator placement  . Arthritis    knees  . Cataract   . Chronic systolic heart failure ([place])   . Colon polyps    tubular adenoma  . Diabetes mellitus   . Dysrhythmia   . GERD (gastroesophageal reflux disease)   .  Hx of myocardial infarction   . Hyperlipidemia   . Hypertension   . NICM (nonischemic cardiomyopathy) ([place])    a.normal cors by cath in 2005. b. s/p Medtronic ICD.  [name] Pacemaker   . Prostate cancer ([place]) 06/2003   s/p prostatectomy  . S/P radiation therapy 10/10/2014 through 11/26/2014    Prostate bed 6600 cGy in 33 sessions   . Sleep apnea   . Ventricular tachycardia ([place])    a. s/p ICD (generator change 09/2012). b. 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 and placed on sotalol    Past Surgical History:  Procedure Laterality Date  . CARDIAC DEFIBRILLATOR PLACEMENT  03/11/2004   Medtronic Maximo single lead  . COLONOSCOPY W/ BIOPSIES AND POLYPECTOMY  08/2004, 02/24/11   55m adenoma in 2006,  5 mm polyp (not recovered) 2012  . CYSTOSCOPY WITH DIRECT VISION INTERNAL URETHROTOMY N/A 10/08/2015   Procedure: CYSTOSCOPY WITH DIRECT VISION INTERNAL URETHROTOMY;  Surgeon: BNickie Retort MD;  Location: [facility];  Service: Urology;  Laterality: N/A;  . IMPLANTABLE CARDIOVERTER DEFIBRILLATOR GENERATOR CHANGE N/A 10/07/2012   Procedure: IMPLANTABLE CARDIOVERTER DEFIBRILLATOR GENERATOR CHANGE;  Surgeon: GEvans Lance MD;  [facility];  Service: Cardiovascular;  Laterality: N/A;  . PROSTATECTOMY  06/2003   Dr NJanice Norrie . RECTAL SURGERY    . UPPER GASTROINTESTINAL ENDOSCOPY  08/2004, 02/24/11   Barrett's esophagus    Social History   Socioeconomic History  . Marital status: Married    Spouse name: Not on file  . Number of children: 2  . Years of education: Not on file  . Highest education level: Not on file  Occupational History  . 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 Smoking status: Never Smoker  . Smokeless tobacco: Never Used  Substance and Sexual Activity  . Alcohol use: No  . Drug use: No  . Sexual activity: Not on file  Other Topics Concern  . Not on file  Social History Narrative  . Not on  file   Social Determinants of Health   Financial Resource Strain:   . Difficulty of Paying Living Expenses:   Food Insecurity:   . Worried About RCharity fundraiserin the Last Year:   . RArboriculturistin the Last Year:   Transportation Needs:   . LFilm/video editor(Medical):   .Marland KitchenLack of Transportation (Non-Medical):   Physical Activity:   . Days of Exercise per Week:   . Minutes of Exercise per Session:   Stress:   . Feeling of Stress :   Social Connections:   . Frequency of Communication with Friends and Family:   . Frequency of Social Gatherings with Friends and Family:   . Attends Religious Services:   . Active Member of Clubs or Organizations:   . Attends CArchivistMeetings:   .  Marital Status:     Family History  Problem Relation Age of Onset  . Coronary artery disease Father   . Prostate cancer Father   . Diabetes Father   . Hypertension Mother   . Diabetes Paternal Grandmother   . Stroke Neg Hx     Review of Systems  Constitutional: Negative for chills and fever.  Respiratory: Negative for cough, shortness of breath and wheezing.   Cardiovascular: Positive for chest pain (occ sharp pain from defib). Negative for palpitations and leg swelling.  Neurological: Positive for numbness (occ hands). Negative for light-headedness and headaches.  Psychiatric/Behavioral: Negative for sleep disturbance.       Objective:   Vitals:   10/17/19 1057  BP: 124/70  Pulse: 73  Temp: 98.1 F (36.7 C)  SpO2: 97%   BP Readings from Last 3 Encounters:  10/17/19 124/70  08/24/19 134/78  05/01/19 120/70   Wt Readings from Last 3 Encounters:  10/17/19 231 lb 9.6 oz (105.1 kg)  08/24/19 237 lb (107.5 kg)  05/01/19 250 lb 6.4 oz (113.6 kg)   Body mass index is 33.23 kg/m.   Physical Exam    Constitutional: Appears well-developed and well-nourished. No distress.  HENT:  Head: Normocephalic and atraumatic.  Neck: Neck supple. No tracheal deviation  present. No thyromegaly present.  No cervical lymphadenopathy Cardiovascular: Normal rate, regular rhythm and normal heart sounds.   No murmur heard. No carotid bruit .  No edema Pulmonary/Chest: Effort normal and breath sounds normal. No respiratory distress. No has no wheezes. No rales.  Skin: Skin is warm and dry. Not diaphoretic.  Psychiatric: Normal mood and affect. Behavior is normal.    Diabetic Foot Exam - Simple   Simple Foot Form Diabetic Foot exam was performed with the following findings: Yes 10/17/2019 11:28 AM  Visual Inspection No deformities, no ulcerations, no other skin breakdown bilaterally: Yes Sensation Testing Intact to touch and monofilament testing bilaterally: Yes Pulse Check Posterior Tibialis and Dorsalis pulse intact bilaterally: Yes Comments       Assessment & Plan:   His eye exam and colonoscopy are scheduled.  See Problem List for Assessment and Plan of chronic medical problems.    This visit occurred during the SARS-CoV-2 public health emergency.  Safety protocols were in place, including screening questions prior to the visit, additional usage of staff PPE, and extensive cleaning of exam room while observing appropriate contact time as indicated for disinfecting solutions.

## 2019-10-17 ENCOUNTER — Encounter: Payer: Self-pay | Admitting: Internal Medicine

## 2019-10-17 ENCOUNTER — Other Ambulatory Visit: Payer: Self-pay

## 2019-10-17 ENCOUNTER — Ambulatory Visit (INDEPENDENT_AMBULATORY_CARE_PROVIDER_SITE_OTHER): Payer: Medicare PPO | Admitting: Internal Medicine

## 2019-10-17 VITALS — BP 124/70 | HR 73 | Temp 98.1°F | Ht 70.0 in | Wt 231.6 lb

## 2019-10-17 DIAGNOSIS — E1151 Type 2 diabetes mellitus with diabetic peripheral angiopathy without gangrene: Secondary | ICD-10-CM

## 2019-10-17 DIAGNOSIS — K219 Gastro-esophageal reflux disease without esophagitis: Secondary | ICD-10-CM | POA: Diagnosis not present

## 2019-10-17 DIAGNOSIS — I10 Essential (primary) hypertension: Secondary | ICD-10-CM | POA: Diagnosis not present

## 2019-10-17 DIAGNOSIS — G2581 Restless legs syndrome: Secondary | ICD-10-CM

## 2019-10-17 DIAGNOSIS — I5022 Chronic systolic (congestive) heart failure: Secondary | ICD-10-CM

## 2019-10-17 DIAGNOSIS — E7849 Other hyperlipidemia: Secondary | ICD-10-CM | POA: Diagnosis not present

## 2019-10-17 LAB — COMPREHENSIVE METABOLIC PANEL
ALT: 13 U/L (ref 0–53)
AST: 13 U/L (ref 0–37)
Albumin: 4 g/dL (ref 3.5–5.2)
Alkaline Phosphatase: 142 U/L — ABNORMAL HIGH (ref 39–117)
BUN: 10 mg/dL (ref 6–23)
CO2: 29 mEq/L (ref 19–32)
Calcium: 8.8 mg/dL (ref 8.4–10.5)
Chloride: 106 mEq/L (ref 96–112)
Creatinine, Ser: 0.96 mg/dL (ref 0.40–1.50)
GFR: 94.14 mL/min (ref >60.00)
Glucose, Bld: 136 mg/dL — ABNORMAL HIGH (ref 70–99)
Potassium: 3.7 mEq/L (ref 3.5–5.1)
Sodium: 139 mEq/L (ref 135–145)
Total Bilirubin: 0.5 mg/dL (ref 0.2–1.2)
Total Protein: 7.2 g/dL (ref 6.0–8.3)

## 2019-10-17 LAB — CBC WITH DIFFERENTIAL/PLATELET
Basophils Absolute: 0 10*3/uL (ref 0.0–0.1)
Basophils Relative: 0.2 % (ref 0.0–3.0)
Eosinophils Absolute: 0.1 10*3/uL (ref 0.0–0.7)
Eosinophils Relative: 1.7 % (ref 0.0–5.0)
HCT: 36.1 % — ABNORMAL LOW (ref 39.0–52.0)
Hemoglobin: 11.6 g/dL — ABNORMAL LOW (ref 13.0–17.0)
Lymphocytes Relative: 22.8 % (ref 12.0–46.0)
Lymphs Abs: 1.7 10*3/uL (ref 0.7–4.0)
MCHC: 32.1 g/dL (ref 30.0–36.0)
MCV: 73.4 fl — ABNORMAL LOW (ref 78.0–100.0)
Monocytes Absolute: 0.5 10*3/uL (ref 0.1–1.0)
Monocytes Relative: 6.9 % (ref 3.0–12.0)
Neutro Abs: 5 10*3/uL (ref 1.4–7.7)
Neutrophils Relative %: 68.4 % (ref 43.0–77.0)
Platelets: 215 10*3/uL (ref 150.0–400.0)
RBC: 4.92 Mil/uL (ref 4.22–5.81)
RDW: 16.6 % — ABNORMAL HIGH (ref 11.5–15.5)
WBC: 7.3 10*3/uL (ref 4.0–10.5)

## 2019-10-17 LAB — HEMOGLOBIN A1C: Hgb A1c MFr Bld: 7.1 % — ABNORMAL HIGH (ref 4.6–6.5)

## 2019-10-17 LAB — LIPID PANEL
Cholesterol: 93 mg/dL (ref 0–200)
HDL: 28.6 mg/dL — ABNORMAL LOW (ref >39.00)
LDL Cholesterol: 47 mg/dL (ref 0–99)
NonHDL: 64.09
Total CHOL/HDL Ratio: 3
Triglycerides: 83 mg/dL (ref 0.0–149.0)
VLDL: 16.6 mg/dL (ref 0.0–40.0)

## 2019-10-17 NOTE — Assessment & Plan Note (Signed)
Chronic Check lipid panel  Continue daily statin Regular exercise and healthy diet encouraged  

## 2019-10-17 NOTE — Assessment & Plan Note (Signed)
Chronic Taking Janumet once daily He is compliant with a diabetic diet and has lost some weight Active at work Check A1c Eye exam scheduled

## 2019-10-17 NOTE — Assessment & Plan Note (Signed)
Chronic BP well controlled Current regimen effective and well tolerated Continue current medications at current doses cmp  

## 2019-10-17 NOTE — Assessment & Plan Note (Signed)
Chronic GERD controlled Continue daily medication Pepcid, omeprazole

## 2019-10-17 NOTE — Assessment & Plan Note (Signed)
Chronic Clonazepam as needed Continue

## 2019-10-17 NOTE — Assessment & Plan Note (Signed)
Chronic Euvolemic on exam Following with cardiology Continue current medications

## 2019-10-18 ENCOUNTER — Encounter: Payer: Self-pay | Admitting: Internal Medicine

## 2019-10-18 ENCOUNTER — Ambulatory Visit (INDEPENDENT_AMBULATORY_CARE_PROVIDER_SITE_OTHER): Payer: Medicare PPO

## 2019-10-18 DIAGNOSIS — Z0000 Encounter for general adult medical examination without abnormal findings: Secondary | ICD-10-CM | POA: Diagnosis not present

## 2019-10-18 MED ORDER — JANUMET 50-1000 MG PO TABS: 1.0000 | ORAL_TABLET | Freq: Two times a day (BID) | ORAL | 1 refills | Status: DC

## 2019-10-18 NOTE — Patient Instructions (Signed)
[name] , Thank you for taking time to come for your Medicare Wellness Visit. I appreciate your ongoing commitment to your health goals. Please review the following plan we discussed and let me know if I can assist you in the future.   Screening recommendations/referrals: Colonoscopy: last done 02/24/2011; scheduled for June 2021 Recommended yearly ophthalmology/optometry visit for glaucoma screening and checkup Recommended yearly dental visit for hygiene and checkup  Vaccinations: Influenza vaccine: 02/07/2019; due once a year Pneumococcal vaccine: completed Tdap vaccine: 11/29/2012; due every 10 years Shingles vaccine: completed   Covid-19: completed  Advanced directives: No; paper work requested and mailed to patient.  Conditions/risks identified: Yes Continue to stay active physically and mentally. Continue doing brain stimulating activities (puzzles, reading, adult coloring books, staying active) to keep memory sharp.  Continue to eat heart healthy diet (full of fruits, vegetables, whole grains, lean protein, water--limit salt, fat, and sugar intake) and increase physical activity as tolerated.  Next appointment: Please schedule your 1 year Medicare Wellness Visit with your Health Coach.  Preventive Care 14 Years and Older, Male Preventive care refers to lifestyle choices and visits with your health care provider that can promote health and wellness. What does preventive care include?  A yearly physical exam. This is also called an annual well check.  Dental exams once or twice a year.  Routine eye exams. Ask your health care provider how often you should have your eyes checked.  Personal lifestyle choices, including:  Daily care of your teeth and gums.  Regular physical activity.  Eating a healthy diet.  Avoiding tobacco and drug use.  Limiting alcohol use.  Practicing safe sex.  Taking low doses of aspirin every day.  Taking vitamin and mineral supplements as  recommended by your health care provider. What happens during an annual well check? The services and screenings done by your health care provider during your annual well check will depend on your age, overall health, lifestyle risk factors, and family history of disease. Counseling  Your health care provider may ask you questions about your:  Alcohol use.  Tobacco use.  Drug use.  Emotional well-being.  Home and relationship well-being.  Sexual activity.  Eating habits.  History of falls.  Memory and ability to understand (cognition).  Work and work Statistician. Screening  You may have the following tests or measurements:  Height, weight, and BMI.  Blood pressure.  Lipid and cholesterol levels. These may be checked every 5 years, or more frequently if you are over 87 years old.  Skin check.  Lung cancer screening. You may have this screening every year starting at age 28 if you have a 30-pack-year history of smoking and currently smoke or have quit within the past 15 years.  Fecal occult blood test (FOBT) of the stool. You may have this test every year starting at age 63.  Flexible sigmoidoscopy or colonoscopy. You may have a sigmoidoscopy every 5 years or a colonoscopy every 10 years starting at age 6.  Prostate cancer screening. Recommendations will vary depending on your family history and other risks.  Hepatitis C blood test.  Hepatitis B blood test.  Sexually transmitted disease (STD) testing.  Diabetes screening. This is done by checking your blood sugar (glucose) after you have not eaten for a while (fasting). You may have this done every 1-3 years.  Abdominal aortic aneurysm (AAA) screening. You may need this if you are a current or former smoker.  Osteoporosis. You may be screened starting at  age 37 if you are at high risk. Talk with your health care provider about your test results, treatment options, and if necessary, the need for more  tests. Vaccines  Your health care provider may recommend certain vaccines, such as:  Influenza vaccine. This is recommended every year.  Tetanus, diphtheria, and acellular pertussis (Tdap, Td) vaccine. You may need a Td booster every 10 years.  Zoster vaccine. You may need this after age 35.  Pneumococcal 13-valent conjugate (PCV13) vaccine. One dose is recommended after age 49.  Pneumococcal polysaccharide (PPSV23) vaccine. One dose is recommended after age 37. Talk to your health care provider about which screenings and vaccines you need and how often you need them. This information is not intended to replace advice given to you by your health care provider. Make sure you discuss any questions you have with your health care provider. Document Released: 05/24/2015 Document Revised: 01/15/2016 Document Reviewed: 02/26/2015 Elsevier Interactive Patient Education  2017  Prevention in the Home Falls can cause injuries. They can happen to people of all ages. There are many things you can do to make your home safe and to help prevent falls. What can I do on the outside of my home?  Regularly fix the edges of walkways and driveways and fix any cracks.  Remove anything that might make you trip as you walk through a door, such as a raised step or threshold.  Trim any bushes or trees on the path to your home.  Use bright outdoor lighting.  Clear any walking paths of anything that might make someone trip, such as rocks or tools.  Regularly check to see if handrails are loose or broken. Make sure that both sides of any steps have handrails.  Any raised decks and porches should have guardrails on the edges.  Have any leaves, snow, or ice cleared regularly.  Use sand or salt on walking paths during winter.  Clean up any spills in your garage right away. This includes oil or grease spills. What can I do in the bathroom?  Use night lights.  Install grab bars by the  toilet and in the tub and shower. Do not use towel bars as grab bars.  Use non-skid mats or decals in the tub or shower.  If you need to sit down in the shower, use a plastic, non-slip stool.  Keep the floor dry. Clean up any water that spills on the floor as soon as it happens.  Remove soap buildup in the tub or shower regularly.  Attach bath mats securely with double-sided non-slip rug tape.  Do not have throw rugs and other things on the floor that can make you trip. What can I do in the bedroom?  Use night lights.  Make sure that you have a light by your bed that is easy to reach.  Do not use any sheets or blankets that are too big for your bed. They should not hang down onto the floor.  Have a firm chair that has side arms. You can use this for support while you get dressed.  Do not have throw rugs and other things on the floor that can make you trip. What can I do in the kitchen?  Clean up any spills right away.  Avoid walking on wet floors.  Keep items that you use a lot in easy-to-reach places.  If you need to reach something above you, use a strong step stool that has a grab bar.  Keep electrical cords out of the way.  Do not use floor polish or wax that makes floors slippery. If you must use wax, use non-skid floor wax.  Do not have throw rugs and other things on the floor that can make you trip. What can I do with my stairs?  Do not leave any items on the stairs.  Make sure that there are handrails on both sides of the stairs and use them. Fix handrails that are broken or loose. Make sure that handrails are as long as the stairways.  Check any carpeting to make sure that it is firmly attached to the stairs. Fix any carpet that is loose or worn.  Avoid having throw rugs at the top or bottom of the stairs. If you do have throw rugs, attach them to the floor with carpet tape.  Make sure that you have a light switch at the top of the stairs and the bottom of  the stairs. If you do not have them, ask someone to add them for you. What else can I do to help prevent falls?  Wear shoes that:  Do not have high heels.  Have rubber bottoms.  Are comfortable and fit you well.  Are closed at the toe. Do not wear sandals.  If you use a stepladder:  Make sure that it is fully opened. Do not climb a closed stepladder.  Make sure that both sides of the stepladder are locked into place.  Ask someone to hold it for you, if possible.  Clearly mark and make sure that you can see:  Any grab bars or handrails.  First and last steps.  Where the edge of each step is.  Use tools that help you move around (mobility aids) if they are needed. These include:  Canes.  Walkers.  Scooters.  Crutches.  Turn on the lights when you go into a dark area. Replace any light bulbs as soon as they burn out.  Set up your furniture so you have a clear path. Avoid moving your furniture around.  If any of your floors are uneven, fix them.  If there are any pets around you, be aware of where they are.  Review your medicines with your doctor. Some medicines can make you feel dizzy. This can increase your chance of falling. Ask your doctor what other things that you can do to help prevent falls. This information is not intended to replace advice given to you by your health care provider. Make sure you discuss any questions you have with your health care provider. Document Released: 02/21/2009 Document Revised: 10/03/2015 Document Reviewed: 06/01/2014 Elsevier Interactive Patient Education  2017 [employer].

## 2019-10-18 NOTE — Progress Notes (Signed)
I connected with [name] today by telephone and verified that I am speaking with the correct person using two identifiers. Location patient: home Location provider: work Persons participating in the virtual visit: [name] and Lisette Abu, LPN  I discussed the limitations, risks, security and privacy concerns of performing an evaluation and management service by telephone and the availability of in person appointments. I also discussed with the patient that there may be a patient responsible charge related to this service. The patient expressed understanding and verbally consented to this telephonic visit.    Interactive audio and video telecommunications were attempted between this provider and patient, however failed, due to patient having technical difficulties OR patient did not have access to video capability.  We continued and completed visit with audio only.  Some vital signs may be absent or patient reported.   Time Spent with patient on telephone encounter: 20 minutes  Subjective:   [name] is a 69 y.o. male who presents for Medicare Annual/Subsequent preventive examination.  Review of Systems:  No ROS. Medicare Wellness Visit Cardiac Risk Factors include: advanced age (>26mn, >>39women);diabetes mellitus;dyslipidemia;family history of premature cardiovascular disease;hypertension;male gender;obesity (BMI >30kg/m2)     Objective:    Vitals: There were no vitals taken for this visit.  There is no height or weight on file to calculate BMI.  Advanced Directives 10/18/2019 05/23/2019 06/17/2017 10/04/2015 01/01/2015 09/27/2014 10/07/2012  Does Patient Have a Medical Advance Directive? No No No No Yes No Patient does not have advance directive  Does patient want to make changes to medical advance directive? - - - - Yes - information given - -  Would patient like information on creating a medical advance directive? Yes (MAU/Ambulatory/Procedural Areas -  Information given) No - Patient declined Yes (ED - Information included in AVS) No - patient declined information - No - patient declined information -  Pre-existing out of facility DNR order (yellow form or pink MOST form) - - - - - - No    Tobacco Social History   Tobacco Use  Smoking Status Never Smoker  Smokeless Tobacco Never Used     Counseling given: Not Answered   Clinical Intake:  Pre-visit preparation completed: Yes  Pain : No/denies pain     Nutritional Risks: None Diabetes: Yes CBG done?: No Did pt. bring in CBG monitor from home?: No  How often do you need to have someone help you when you read instructions, pamphlets, or other written materials from your doctor or pharmacy?: 1 - Never What is the last grade level you completed in school?: Bachelor's Degree  Interpreter Needed?: No  Information entered by :: Conda Wannamaker N. HLowell Guitar LPN  Past Medical History:  Diagnosis Date  . AICD (automatic cardioverter/defibrillator) present   . Anxiety    since defibrillator placement  . Arthritis    knees  . Cataract   . [place]   . Colon polyps    tubular adenoma  . Diabetes mellitus   . Dysrhythmia   . GERD (gastroesophageal reflux disease)   . Hx of myocardial infarction   . Hyperlipidemia   . Hypertension   . NICM (nonischemic cardiomyopathy) ([place]    a.normal cors by cath in 2005. b. s/p Medtronic ICD.  .Marland KitchenPacemaker   . [place]   s/p prostatectomy  . S/P radiation therapy 10/10/2014 through 11/26/2014    Prostate bed 6600 cGy in 33 sessions   .  Sleep apnea   . Ventricular tachycardia ([place])    a. s/p ICD (generator change 09/2012). b. 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 and placed on sotalol   Past Surgical History:  Procedure Laterality Date  . CARDIAC DEFIBRILLATOR PLACEMENT  03/11/2004   Medtronic Maximo single lead  . COLONOSCOPY W/ BIOPSIES  AND POLYPECTOMY  08/2004, 02/24/11   40m adenoma in 2006,  5 mm polyp (not recovered) 2012  . CYSTOSCOPY WITH DIRECT VISION INTERNAL URETHROTOMY N/A 10/08/2015   Procedure: CYSTOSCOPY WITH DIRECT VISION INTERNAL URETHROTOMY;  Surgeon: BNickie Retort MD;  Location: [facility];  Service: Urology;  Laterality: N/A;  . IMPLANTABLE CARDIOVERTER DEFIBRILLATOR GENERATOR CHANGE N/A 10/07/2012   Procedure: IMPLANTABLE CARDIOVERTER DEFIBRILLATOR GENERATOR CHANGE;  Surgeon: GEvans Lance MD;  [facility];  Service: Cardiovascular;  Laterality: N/A;  . PROSTATECTOMY  06/2003   Dr NJanice Norrie . RECTAL SURGERY    . UPPER GASTROINTESTINAL ENDOSCOPY  08/2004, 02/24/11   Barrett's esophagus   Family History  Problem Relation Age of Onset  . Coronary artery disease Father   . Prostate cancer Father   . Diabetes Father   . Hypertension Mother   . Diabetes Paternal Grandmother   . Stroke Neg Hx    Social History   Socioeconomic History  . Marital status: Married    Spouse name: Not on file  . Number of children: 2  . Years of education: Not on file  . Highest education level: Not on file  Occupational History  . 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 Smoking status: Never Smoker  . Smokeless tobacco: Never Used  Substance and Sexual Activity  . Alcohol use: No  . Drug use: No  . Sexual activity: Not on file  Other Topics Concern  . Not on file  Social History Narrative  . Not on file   Social Determinants of Health   Financial Resource Strain:   . Difficulty of Paying Living Expenses:   Food Insecurity:   . Worried About RCharity fundraiserin the Last Year:   . RArboriculturistin the Last Year:   Transportation Needs:   . LFilm/video editor(Medical):   .Marland KitchenLack of Transportation (Non-Medical):   Physical Activity:   . Days of Exercise per Week:   . Minutes of Exercise per Session:   Stress:   . Feeling of Stress :   Social Connections:   .  Frequency of Communication with Friends and Family:   . Frequency of Social Gatherings with Friends and Family:   . Attends Religious Services:   . Active Member of Clubs or Organizations:   . Attends CArchivistMeetings:   .Marland KitchenMarital Status:     Outpatient Encounter Medications as of 10/18/2019  Medication Sig  . ACCU-CHEK GUIDE test strip USE TO CHECK SUGAR UP TO 4 TIMES DAILY E11.9  . amLODipine (NORVASC) 5 MG tablet TAKE 1 TABLET BY MOUTH EVERY DAY  . aspirin EC 81 MG tablet Take 81 mg by mouth daily.  .Marland Kitchenatorvastatin (LIPITOR) 20 MG tablet Take 1 tablet by mouth daily except 1/2 tablet on Tuesday, Thursday and Saturday.  . blood glucose meter kit and supplies KIT Dispense based on patient and insurance preference. Use up to four times daily as directed. (Dx code- E11.9)  . carvedilol (COREG) 12.5 MG tablet TAKE 1 AND 1/2 TABLETS BY MOUTH TWICE A DAY WITH MEALS (MAX ON INS)  .  clonazePAM (KLONOPIN) 0.5 MG tablet Take 1 tablet (0.5 mg total) by mouth at bedtime as needed for anxiety.  . digoxin (LANOXIN) 0.125 MG tablet TAKE 1 TABLET BY MOUTH EVERY DAY  . famotidine (PEPCID) 20 MG tablet Take 1 tablet (20 mg total) by mouth 2 (two) times daily.  . Lancets (ONETOUCH ULTRASOFT) lancets Use as instructed to test sugar daily and prn. Dx E11.9  . omeprazole (PRILOSEC) 20 MG capsule TAKE 1 CAPSULE BY MOUTH DAILY. RECOMMENDED TO TAKE 30 MIN BEFORE BREAKFAST. *MAX PER INSURANCE  . ramipril (ALTACE) 10 MG capsule TAKE 1 CAPSULE (10 MG TOTAL) BY MOUTH 2 (TWO) TIMES DAILY.  . sitaGLIPtin-metformin (JANUMET) 50-1000 MG tablet Take 1 tablet by mouth daily with supper.  . sotalol (BETAPACE) 120 MG tablet TAKE 1 TABLET BY MOUTH TWICE A DAY  . vitamin B-12 (CYANOCOBALAMIN) 1000 MCG tablet Take 1,000 mcg by mouth daily.   No facility-administered encounter medications on file as of 10/18/2019.    Activities of Daily Living In your present state of health, do you have any difficulty performing the  following activities: 10/18/2019  Hearing? N  Vision? N  Difficulty concentrating or making decisions? N  Walking or climbing stairs? N  Dressing or bathing? N  Doing errands, shopping? N  Preparing Food and eating ? N  Using the Toilet? N  In the past six months, have you accidently leaked urine? N  Do you have problems with loss of bowel control? N  Managing your Medications? N  Managing your Finances? N  Housekeeping or managing your Housekeeping? N  Some recent data might be hidden    Patient Care Team: Binnie Rail, MD as PCP - General (Internal Medicine) Evans Lance, MD as PCP - Cardiology (Cardiology) Ardis Hughs, MD as Attending Physician (Urology) Gatha Mayer, MD as Consulting Physician (Gastroenterology)   Assessment:   This is a routine wellness examination for [place].  Exercise Activities and Dietary recommendations Current Exercise Habits: The patient has a physically strenuous job, but has no regular exercise apart from work., Exercise limited by: None identified  Goals    . Client understands the importance of follow-up with providers by attending scheduled visits    . DIET - REDUCE SUGAR INTAKE (pt-stated)     Would like to get A1C down to goal of 6.0    . Patient Stated     I plan to take time for myself, continue to eat healthy and increase water intake.       Fall Risk Fall Risk  10/18/2019 08/24/2019 06/17/2017 06/17/2017 06/16/2016  Falls in the past year? 0 0 No No Yes  Number falls in past yr: 0 0 - - 1  Injury with Fall? 0 0 - - No  Risk for fall due to : No Fall Risks - - - -  Follow up Falls evaluation completed - - - -   Is the patient's home free of loose throw rugs in walkways, pet beds, electrical cords, etc?   yes      Grab bars in the bathroom? no      Handrails on the stairs?   yes      Adequate lighting?   yes  Timed Get Up and Go Performed: not indicated  Depression Screen PHQ 2/9 Scores 10/18/2019 08/24/2019 06/17/2017  06/17/2017  PHQ - 2 Score 0 0 1 0  PHQ- 9 Score - - 3 -    Cognitive Function     6CIT  Screen 10/18/2019  What Year? 0 points  What month? 0 points  What time? 0 points  Count back from 20 0 points  Months in reverse 0 points  Repeat phrase 0 points  Total Score 0    Immunization History  Administered Date(s) Administered  . Fluad Quad(high Dose 65+) 02/06/2019  . Influenza Split 03/10/2012  . Influenza, High Dose Seasonal PF 06/16/2016, 06/17/2017  . Influenza, Seasonal, Injecte, Preservative Fre 03/10/2014  . Influenza,inj,Quad PF,6+ Mos 03/02/2013  . Influenza,trivalent, recombinat, inj, PF 01/09/2018  . PFIZER SARS-COV-2 Vaccination 05/24/2019, 06/13/2019  . Pneumococcal Conjugate-13 12/14/2016  . Pneumococcal Polysaccharide-23 12/15/2017  . Tdap 11/29/2012  . Zoster Recombinat (Shingrix) 01/08/2018    Qualifies for Shingles Vaccine? completed  Screening Tests Health Maintenance  Topic Date Due  . COLONOSCOPY  02/23/2018  . OPHTHALMOLOGY EXAM  11/16/2018  . INFLUENZA VACCINE  12/10/2019  . HEMOGLOBIN A1C  04/17/2020  . FOOT EXAM  10/16/2020  . TETANUS/TDAP  11/30/2022  . COVID-19 Vaccine  Completed  . Hepatitis C Screening  Completed  . PNA vac Low Risk Adult  Completed   Cancer Screenings: Lung: Low Dose CT Chest recommended if Age 23-80 years, 30 pack-year currently smoking OR have quit w/in 15years. Patient does qualify. Colorectal: Yes; scheduled for June 2021  Additional Screenings Hepatitis C Screening: completed      Plan:     Reviewed health maintenance screenings with patient today and relevant education, vaccines, and/or referrals were provided.    Continue doing brain stimulating activities (puzzles, reading, adult coloring books, staying active) to keep memory sharp.    Continue to eat heart healthy diet (full of fruits, vegetables, whole grains, lean protein, water--limit salt, fat, and sugar intake) and increase physical activity as  tolerated.  I have personally reviewed and noted the following in the patient's chart:   . Medical and social history . Use of alcohol, tobacco or illicit drugs  . Current medications and supplements . Functional ability and status . Nutritional status . Physical activity . Advanced directives . List of other physicians . Hospitalizations, surgeries, and ER visits in previous 12 months . Vitals . Screenings to include cognitive, depression, and falls . Referrals and appointments  In addition, I have reviewed and discussed with patient certain preventive protocols, quality metrics, and best practice recommendations. A written personalized care plan for preventive services as well as general preventive health recommendations were provided to patient.     Sheral Flow, LPN  11/08/6965 Nurse Health Advisor  Nurse Notes: There were no vitals filed for this visit. There is no height or weight on file to calculate BMI.

## 2019-10-24 ENCOUNTER — Ambulatory Visit (AMBULATORY_SURGERY_CENTER): Payer: Self-pay | Admitting: *Deleted

## 2019-10-24 ENCOUNTER — Other Ambulatory Visit: Payer: Self-pay

## 2019-10-24 VITALS — Ht 70.0 in | Wt 230.0 lb

## 2019-10-24 DIAGNOSIS — Z86010 Personal history of colonic polyps: Secondary | ICD-10-CM

## 2019-10-24 DIAGNOSIS — K2270 Barrett's esophagus without dysplasia: Secondary | ICD-10-CM

## 2019-10-24 NOTE — Progress Notes (Signed)
Patient is here in-person for PV. Patient denies any allergies to eggs or soy. Patient denies any problems with anesthesia/sedation. Patient denies any oxygen use at home. Patient denies taking any diet/weight loss medications or blood thinners. Patient is not being treated for MRSA or C-diff. Patient is aware of our care-partner policy and [id number] safety protocol.  Completed covid vaccines on 06/13/19 per pt

## 2019-11-01 ENCOUNTER — Other Ambulatory Visit: Payer: Self-pay | Admitting: Internal Medicine

## 2019-11-07 ENCOUNTER — Encounter: Payer: Self-pay | Admitting: Internal Medicine

## 2019-11-07 ENCOUNTER — Other Ambulatory Visit: Payer: Self-pay

## 2019-11-07 ENCOUNTER — Ambulatory Visit (AMBULATORY_SURGERY_CENTER): Payer: Medicare PPO | Admitting: Internal Medicine

## 2019-11-07 VITALS — BP 132/57 | HR 67 | Temp 97.0°F | Resp 18 | Ht 70.0 in | Wt 230.0 lb

## 2019-11-07 DIAGNOSIS — D125 Benign neoplasm of sigmoid colon: Secondary | ICD-10-CM | POA: Diagnosis not present

## 2019-11-07 DIAGNOSIS — K2270 Barrett's esophagus without dysplasia: Secondary | ICD-10-CM | POA: Diagnosis not present

## 2019-11-07 DIAGNOSIS — Z86010 Personal history of colonic polyps: Secondary | ICD-10-CM | POA: Diagnosis not present

## 2019-11-07 DIAGNOSIS — K5140 Inflammatory polyps of colon without complications: Secondary | ICD-10-CM

## 2019-11-07 MED ORDER — SODIUM CHLORIDE 0.9 % IV SOLN: 500.0000 mL | Freq: Once | INTRAVENOUS | Status: DC

## 2019-11-07 NOTE — Op Note (Signed)
[place] Patient Name: [name] Procedure Date: 11/07/2019 10:01 AM MRN: [id number] Endoscopist: Gatha Mayer , MD Age: 69 Referring MD:  Date of Birth: 1950/07/22 Gender: Male Account #: [id number] Procedure:                Upper GI endoscopy Indications:              Barrett's esophagus, Follow-up of Barrett's                            esophagus Medicines:                Propofol per Anesthesia, Monitored Anesthesia Care Procedure:                Pre-Anesthesia Assessment:                           - Prior to the procedure, a History and Physical                            was performed, and patient medications and                            allergies were reviewed. The patient's tolerance of                            previous anesthesia was also reviewed. The risks                            and benefits of the procedure and the sedation                            options and risks were discussed with the patient.                            All questions were answered, and informed consent                            was obtained. Prior Anticoagulants: The patient has                            taken no previous anticoagulant or antiplatelet                            agents. ASA Grade Assessment: III - A patient with                            severe systemic disease. After reviewing the risks                            and benefits, the patient was deemed in                            satisfactory condition to undergo the procedure.  After obtaining informed consent, the endoscope was                            passed under direct vision. Throughout the                            procedure, the patient's blood pressure, pulse, and                            oxygen saturations were monitored continuously. The                            Endoscope was introduced through the mouth, and                            advanced to the second part  of duodenum. The upper                            GI endoscopy was accomplished without difficulty.                            The patient tolerated the procedure well. Scope In: Scope Out: Findings:                 There were esophageal mucosal changes consistent                            with short-segment Barrett's esophagus present in                            the distal esophagus. The maximum longitudinal                            extent of these mucosal changes was 2 cm in length.                            Mucosa was biopsied with a cold forceps for                            histology. One specimen bottle was sent to                            pathology. Verification of patient identification                            for the specimen was done. Estimated blood loss was                            minimal.                           The gastroesophageal flap valve was visualized                            endoscopically and  classified as Hill Grade IV (no                            fold, wide open lumen, hiatal hernia present).                           The exam was otherwise without abnormality.                           The cardia and gastric fundus were normal on                            retroflexion. Complications:            No immediate complications. Estimated Blood Loss:     Estimated blood loss was minimal. Impression:               - Esophageal mucosal changes consistent with                            short-segment Barrett's esophagus.38-40 cm one                            tongue Biopsied.                           - Gastroesophageal flap valve classified as Hill                            Grade IV (no fold, wide open lumen, hiatal hernia                            present).                           - The examination was otherwise normal. Recommendation:           - Patient has a contact number available for                            emergencies. The signs and  symptoms of potential                            delayed complications were discussed with the                            patient. Return to normal activities tomorrow.                            Written discharge instructions were provided to the                            patient.                           - Resume previous diet.                           -  Continue present medications.                           - Await pathology results. Gatha Mayer, MD 11/07/2019 10:36:40 AM This report has been signed electronically.

## 2019-11-07 NOTE — Progress Notes (Signed)
To PACU, VSS. Report to Rn. Physician aware of additional meds given.tb

## 2019-11-07 NOTE — Op Note (Addendum)
[place] Patient Name: [name] Procedure Date: 11/07/2019 10:01 AM MRN: [id number] Endoscopist: Gatha Mayer , MD Age: 69 Referring MD:  Date of Birth: 1950/10/09 Gender: Male Account #: [id number] Procedure:                Colonoscopy Indications:              Surveillance: Personal history of adenomatous                            polyps on last colonoscopy > 5 years ago Medicines:                Propofol per Anesthesia, Monitored Anesthesia Care Procedure:                Pre-Anesthesia Assessment:                           - Prior to the procedure, a History and Physical                            was performed, and patient medications and                            allergies were reviewed. The patient's tolerance of                            previous anesthesia was also reviewed. The risks                            and benefits of the procedure and the sedation                            options and risks were discussed with the patient.                            All questions were answered, and informed consent                            was obtained. Prior Anticoagulants: The patient has                            taken no previous anticoagulant or antiplatelet                            agents. ASA Grade Assessment: III - A patient with                            severe systemic disease. After reviewing the risks                            and benefits, the patient was deemed in                            satisfactory condition to undergo the procedure.  After obtaining informed consent, the colonoscope                            was passed under direct vision. Throughout the                            procedure, the patient's blood pressure, pulse, and                            oxygen saturations were monitored continuously. The                            Colonoscope was introduced through the anus and                             advanced to the the cecum, identified by                            appendiceal orifice and ileocecal valve. The                            colonoscopy was performed without difficulty. The                            patient tolerated the procedure well. The quality                            of the bowel preparation was good. The ileocecal                            valve, appendiceal orifice, and rectum were                            photographed. Scope In: 10:13:54 AM Scope Out: 10:26:46 AM Scope Withdrawal Time: 0 hours 10 minutes 19 seconds  Total Procedure Duration: 0 hours 12 minutes 52 seconds  Findings:                 The perianal and digital rectal examinations were                            normal. Pertinent negatives include normal prostate                            (size, shape, and consistency).                           A diminutive polyp was found in the distal sigmoid                            colon. The polyp was sessile. The polyp was removed                            with a cold biopsy forceps. Resection and retrieval  were complete. Verification of patient                            identification for the specimen was done. Estimated                            blood loss was minimal.                           A few small-mouthed diverticula were found in the                            sigmoid colon.                           The exam was otherwise without abnormality on                            direct and retroflexion views. Complications:            No immediate complications. Estimated Blood Loss:     Estimated blood loss was minimal. Impression:               - One diminutive polyp in the distal sigmoid colon,                            removed with a cold biopsy forceps. Resected and                            retrieved.                           - Diverticulosis in the sigmoid colon.                           - The examination  was otherwise normal on direct                            and retroflexion views.                           - Personal history of colonic polyps. Diminutive                            adenoma 2006 and diminutive polyp removed but not                            retrieved 2012 Recommendation:           - Patient has a contact number available for                            emergencies. The signs and symptoms of potential                            delayed complications were discussed with the  patient. Return to normal activities tomorrow.                            Written discharge instructions were provided to the                            patient.                           - Resume previous diet.                           - Continue present medications.                           - Repeat colonoscopy is recommended. The                            colonoscopy date will be determined after pathology                            results from today's exam become available for                            review. Gatha Mayer, MD 11/07/2019 10:39:15 AM This report has been signed electronically. Addendum Number: 1   Addendum Date: 11/09/2019 12:01:47 PM      CORRECTION      PROSTATE IS SURGICALLY ABSENT Gatha Mayer, MD 11/09/2019 12:02:04 PM This report has been signed electronically.

## 2019-11-07 NOTE — Patient Instructions (Addendum)
The esophagus changes of Barrett's esophagus look the same. Routine biopsies taken.  One tiny colon polyp removed. Will get that analyzed also.  I appreciate the opportunity to care for you. Gatha Mayer, MD, [facility]   Handout given for polyps.  YOU HAD AN ENDOSCOPIC PROCEDURE TODAY AT [facility]:   Refer to the procedure report that was given to you for any specific questions about what was found during the examination.  If the procedure report does not answer your questions, please call your gastroenterologist to clarify.  If you requested that your care partner not be given the details of your procedure findings, then the procedure report has been included in a sealed envelope for you to review at your convenience later.  YOU SHOULD EXPECT: Some feelings of bloating in the abdomen. Passage of more gas than usual.  Walking can help get rid of the air that was put into your GI tract during the procedure and reduce the bloating. If you had a lower endoscopy (such as a colonoscopy or flexible sigmoidoscopy) you may notice spotting of blood in your stool or on the toilet paper. If you underwent a bowel prep for your procedure, you may not have a normal bowel movement for a few days.  Please Note:  You might notice some irritation and congestion in your nose or some drainage.  This is from the oxygen used during your procedure.  There is no need for concern and it should clear up in a day or so.  SYMPTOMS TO REPORT IMMEDIATELY:   Following lower endoscopy (colonoscopy or flexible sigmoidoscopy):  Excessive amounts of blood in the stool  Significant tenderness or worsening of abdominal pains  Swelling of the abdomen that is new, acute  Fever of 100F or higher   Following upper endoscopy (EGD)  Vomiting of blood or coffee ground material  New chest pain or pain under the shoulder blades  Painful or persistently difficult swallowing  New shortness of breath  Fever of  100F or higher  Black, tarry-looking stools  For urgent or emergent issues, a gastroenterologist can be reached at any hour by calling [phone]. Do not use MyChart messaging for urgent concerns.    DIET:  We do recommend a small meal at first, but then you may proceed to your regular diet.  Drink plenty of fluids but you should avoid alcoholic beverages for 24 hours.  ACTIVITY:  You should plan to take it easy for the rest of today and you should NOT DRIVE or use heavy machinery until tomorrow (because of the sedation medicines used during the test).    FOLLOW UP: Our staff will call the number listed on your records 48-72 hours following your procedure to check on you and address any questions or concerns that you may have regarding the information given to you following your procedure. If we do not reach you, we will leave a message.  We will attempt to reach you two times.  During this call, we will ask if you have developed any symptoms of COVID 19. If you develop any symptoms (ie: fever, flu-like symptoms, shortness of breath, cough etc.) before then, please call [phone].  If you test positive for Covid 19 in the 2 weeks post procedure, please call and report this information to [place].    If any biopsies were taken you will be contacted by phone or by letter within the next 1-3 weeks.  Please call us at [phone] if  you have not heard about the biopsies in 3 weeks.    SIGNATURES/CONFIDENTIALITY: You and/or your care partner have signed paperwork which will be entered into your electronic medical record.  These signatures attest to the fact that that the information above on your After Visit Summary has been reviewed and is understood.  Full responsibility of the confidentiality of this discharge information lies with you and/or your care-partner.

## 2019-11-07 NOTE — Progress Notes (Signed)
Called to room to assist during endoscopic procedure.  Patient ID and intended procedure confirmed with present staff. Received instructions for my participation in the procedure from the performing physician.  

## 2019-11-07 NOTE — Progress Notes (Signed)
Pt's states no medical or surgical changes since previsit or office visit.  VS, CW

## 2019-11-09 ENCOUNTER — Telehealth: Payer: Self-pay

## 2019-11-09 NOTE — Telephone Encounter (Signed)
°  Follow up Call-  Call back number 11/07/2019  Post procedure Call Back phone  # [phone]  Permission to leave phone message Yes  Some recent data might be hidden     Patient questions:  Do you have a fever, pain , or abdominal swelling? No. Pain Score  0 *  Have you tolerated food without any problems? Yes.    Have you been able to return to your normal activities? Yes.    Do you have any questions about your discharge instructions: Diet   No. Medications  No. Follow up visit  No.  Do you have questions or concerns about your Care? Yes.  Patient reports that he had his prostate removed 17 years ago.  He would like a call back that this finding has been corrected.    Actions: * If pain score is 4 or above: No action needed, pain <4.  1. Have you developed a fever since your procedure? no  2.   Have you had an respiratory symptoms (SOB or cough) since your procedure? no  3.   Have you tested positive for COVID 19 since your procedure no  4.   Have you had any family members/close contacts diagnosed with the COVID 19 since your procedure?  no   If yes to any of these questions please route to Joylene John, RN and Erenest Rasher, RN

## 2019-11-09 NOTE — Telephone Encounter (Signed)
Corrected report Left him a message Will mail new copy  My Chart message also

## 2019-11-14 ENCOUNTER — Encounter: Payer: Self-pay | Admitting: Internal Medicine

## 2019-11-17 ENCOUNTER — Other Ambulatory Visit: Payer: Self-pay

## 2019-11-17 MED ORDER — CARVEDILOL 12.5 MG PO TABS: ORAL_TABLET | ORAL | 1 refills | Status: DC

## 2019-11-20 ENCOUNTER — Other Ambulatory Visit: Payer: Self-pay | Admitting: Internal Medicine

## 2019-11-27 DIAGNOSIS — H04123 Dry eye syndrome of bilateral lacrimal glands: Secondary | ICD-10-CM | POA: Diagnosis not present

## 2019-11-27 DIAGNOSIS — H2513 Age-related nuclear cataract, bilateral: Secondary | ICD-10-CM | POA: Diagnosis not present

## 2019-11-27 DIAGNOSIS — H40013 Open angle with borderline findings, low risk, bilateral: Secondary | ICD-10-CM | POA: Diagnosis not present

## 2019-11-27 DIAGNOSIS — E119 Type 2 diabetes mellitus without complications: Secondary | ICD-10-CM | POA: Diagnosis not present

## 2019-12-02 ENCOUNTER — Encounter: Payer: Self-pay | Admitting: Internal Medicine

## 2019-12-02 DIAGNOSIS — D234 Other benign neoplasm of skin of scalp and neck: Secondary | ICD-10-CM

## 2019-12-04 NOTE — Telephone Encounter (Signed)
Pt has been scheduled with Dr Nevada Crane and pt is aware

## 2019-12-06 ENCOUNTER — Telehealth: Payer: Self-pay | Admitting: Internal Medicine

## 2019-12-06 NOTE — Telephone Encounter (Signed)
yes

## 2019-12-06 NOTE — Telephone Encounter (Signed)
   Scheduler left message voicemail to schedule appointment

## 2019-12-06 NOTE — Telephone Encounter (Signed)
    Patient requesting pain  medication for neck pain

## 2019-12-07 ENCOUNTER — Encounter: Payer: Self-pay | Admitting: Internal Medicine

## 2019-12-07 ENCOUNTER — Ambulatory Visit: Payer: Medicare PPO | Admitting: Internal Medicine

## 2019-12-07 ENCOUNTER — Other Ambulatory Visit: Payer: Self-pay

## 2019-12-07 DIAGNOSIS — E1151 Type 2 diabetes mellitus with diabetic peripheral angiopathy without gangrene: Secondary | ICD-10-CM

## 2019-12-07 DIAGNOSIS — L0211 Cutaneous abscess of neck: Secondary | ICD-10-CM | POA: Diagnosis not present

## 2019-12-07 DIAGNOSIS — I10 Essential (primary) hypertension: Secondary | ICD-10-CM | POA: Diagnosis not present

## 2019-12-07 MED ORDER — HYDROCODONE-ACETAMINOPHEN 5-325 MG PO TABS: 1.0000 | ORAL_TABLET | Freq: Four times a day (QID) | ORAL | 0 refills | Status: DC | PRN

## 2019-12-07 NOTE — Patient Instructions (Signed)
Please take all new medication as prescribed - the pain medication  We will try to refer to General Surgury first thing tomorrow  If the surgeons say they dont do this kind of work in that area in the back of the neck, we may need to refer to ENT  Please continue all other medications as before, and refills have been done if requested.  Please have the pharmacy call with any other refills you may need.  Please keep your appointments with your specialists as you may have planned

## 2019-12-07 NOTE — Assessment & Plan Note (Signed)
stable overall by history and exam, recent data reviewed with pt, and pt to continue medical treatment as before,  to f/u any worsening symptoms or concerns  

## 2019-12-07 NOTE — Progress Notes (Signed)
Subjective:    Patient ID: [name], male    DOB: 04/25/1951, 69 y.o.   MRN: [id number]  HPI  Here to f/u with c/o 4-5 days acute onset now severe post midline neck abscess with red, tender swelling, quite large and sore but no yet drained.  Not sure how happened but had similar x 1 in the past near the same place. Denies fever, chills, Pt denies chest pain, increased sob or doe, wheezing, orthopnea, PND, increased LE swelling, palpitations, dizziness or syncope.  Pt denies polydipsia, polyuria,  Past Medical History:  Diagnosis Date  . AICD (automatic cardioverter/defibrillator) present   . Anxiety    since defibrillator placement  . Arthritis    knees  . Cataract   . Chronic systolic heart failure ([place])   . Colon polyps    tubular adenoma  . Diabetes mellitus   . Dysrhythmia   . GERD (gastroesophageal reflux disease)   . Hx of myocardial infarction   . Hyperlipidemia   . Hypertension   . NICM (nonischemic cardiomyopathy) ([place])    a.normal cors by cath in 2005. b. s/p Medtronic ICD.  [name] Pacemaker   . Prostate cancer ([place]) 06/2003   s/p prostatectomy  . S/P radiation therapy 10/10/2014 through 11/26/2014    Prostate bed 6600 cGy in 33 sessions   . Sleep apnea    no CPAP use  . Ventricular tachycardia ([place])    a. s/p ICD (generator change 09/2012). b. 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 and placed on sotalol   Past Surgical History:  Procedure Laterality Date  . CARDIAC DEFIBRILLATOR PLACEMENT  03/11/2004   Medtronic Maximo single lead  . COLONOSCOPY W/ BIOPSIES AND POLYPECTOMY  08/2004, 02/24/11   21m adenoma in 2006,  5 mm polyp (not recovered) 2012  . CYSTOSCOPY WITH DIRECT VISION INTERNAL URETHROTOMY N/A 10/08/2015   Procedure: CYSTOSCOPY WITH DIRECT VISION INTERNAL URETHROTOMY;  Surgeon: BNickie Retort MD;  Location: [facility];  Service: Urology;  Laterality: N/A;  . IMPLANTABLE CARDIOVERTER DEFIBRILLATOR  GENERATOR CHANGE N/A 10/07/2012   Procedure: IMPLANTABLE CARDIOVERTER DEFIBRILLATOR GENERATOR CHANGE;  Surgeon: GEvans Lance MD;  [facility];  Service: Cardiovascular;  Laterality: N/A;  . PROSTATECTOMY  06/2003   Dr NJanice Norrie . RECTAL SURGERY    . UPPER GASTROINTESTINAL ENDOSCOPY  08/2004, 02/24/11   Barrett's esophagus    reports that he has never smoked. He has never used smokeless tobacco. He reports that he does not drink alcohol and does not use drugs. family history includes Coronary artery disease in his father; Diabetes in his father and paternal grandmother; Hypertension in his mother; Prostate cancer in his father. Allergies  Allergen Reactions  . Zantac [Ranitidine Hcl]     itching   Current Outpatient Medications on File Prior to Visit  Medication Sig Dispense Refill  . ACCU-CHEK GUIDE test strip USE TO CHECK SUGAR UP TO 4 TIMES DAILY E11.9 100 strip 3  . amLODipine (NORVASC) 5 MG tablet TAKE 1 TABLET BY MOUTH EVERY DAY 90 tablet 1  . aspirin EC 81 MG tablet Take 81 mg by mouth daily.    .Marland Kitchenatorvastatin (LIPITOR) 20 MG tablet TAKE 1 TABLET BY MOUTH DAILY EXCEPT 1/2 TABLET ON TUESDAY, THURSDAY AND SATURDAY. 90 tablet 1  . blood glucose meter kit and supplies KIT Dispense based on patient and insurance preference. Use up to four times daily as directed. (Dx code- E11.9) 1 each 0  . carvedilol (COREG) 12.5 MG tablet TAKE 1  AND 1/2 TABLETS BY MOUTH TWICE A DAY WITH MEALS (MAX ON INS) 270 tablet 1  . clonazePAM (KLONOPIN) 0.5 MG tablet Take 1 tablet (0.5 mg total) by mouth at bedtime as needed for anxiety. 30 tablet 0  . digoxin (LANOXIN) 0.125 MG tablet TAKE 1 TABLET BY MOUTH EVERY DAY 90 tablet 1  . famotidine (PEPCID) 20 MG tablet TAKE 1 TABLET BY MOUTH TWICE A DAY 180 tablet 1  . Lancets (ONETOUCH ULTRASOFT) lancets Use as instructed to test sugar daily and prn. Dx E11.9 100 each 12  . omeprazole (PRILOSEC) 20 MG capsule TAKE 1 CAPSULE BY MOUTH DAILY. RECOMMENDED TO TAKE  30 MIN BEFORE BREAKFAST. *MAX PER INSURANCE 90 capsule 1  . Prednisol Ace-Moxiflox-Bromfen 1-0.5-0.075 % SUSP Place 1 drop into the left eye 4 (four) times daily.    . ramipril (ALTACE) 10 MG capsule TAKE 1 CAPSULE (10 MG TOTAL) BY MOUTH 2 (TWO) TIMES DAILY. 180 capsule 1  . sitaGLIPtin-metformin (JANUMET) 50-1000 MG tablet Take 1 tablet by mouth 2 (two) times daily with a meal. 180 tablet 1  . sotalol (BETAPACE) 120 MG tablet TAKE 1 TABLET BY MOUTH TWICE A DAY 180 tablet 1  . vitamin B-12 (CYANOCOBALAMIN) 1000 MCG tablet Take 1,000 mcg by mouth daily.     No current facility-administered medications on file prior to visit.   Review of Systems All otherwise neg per pt    Objective:   Physical Exam BP 128/82 (BP Location: Left Arm, Patient Position: Sitting, Cuff Size: Large)   Pulse 74   Temp 98.4 F (36.9 C) (Oral)   Ht _0  (1.778 m)   Wt (!) 229 lb (103.9 kg)   SpO2 96%   BMI 32.86 kg/m  VS noted,  Constitutional: Pt appears in NAD HENT: Head: NCAT.  Right Ear: External ear normal.  Left Ear: External ear normal.  Eyes: . Pupils are equal, round, and reactive to light. Conjunctivae and EOM are normal Nose: without d/c or deformity Neck: Neck supple. Gross normal ROM but has large 2 cm area raised red tender to mid aspect mid to lower neck, fluctuant without drianage Cardiovascular: Normal rate and regular rhythm.   Pulmonary/Chest: Effort normal and breath sounds without rales or wheezing.  Neurological: Pt is alert. At baseline orientation, motor grossly intact Skin: Skin is warm. No rashes, other new lesions, no LE edema Psychiatric: Pt behavior is normal without agitation  All otherwise neg per pt Lab Results  Component Value Date   WBC 7.3 10/17/2019   HGB 11.6 (L) 10/17/2019   HCT 36.1 (L) 10/17/2019   PLT 215.0 10/17/2019   GLUCOSE 136 (H) 10/17/2019   CHOL 93 10/17/2019   TRIG 83.0 10/17/2019   HDL 28.60 (L) 10/17/2019   LDLCALC 47 10/17/2019   ALT 13  10/17/2019   AST 13 10/17/2019   NA 139 10/17/2019   K 3.7 10/17/2019   CL 106 10/17/2019   CREATININE 0.96 10/17/2019   BUN 10 10/17/2019   CO2 29 10/17/2019   TSH 2.02 06/17/2017   INR 1.03 12/31/2010   HGBA1C 7.1 (H) 10/17/2019   MICROALBUR 1.2 04/03/2014      Assessment & Plan:

## 2019-12-07 NOTE — Assessment & Plan Note (Signed)
Lab Results  Component Value Date   HGBA1C 7.1 (H) 10/17/2019  .stable overall by history and exam, recent data reviewed with pt, and pt to continue medical treatment as before,  to f/u any worsening symptoms or concerns  I spent 31 minutes in preparing to see the patient by review of recent labs, imaging and procedures, obtaining and reviewing separately obtained history, communicating with the patient and family or caregiver, ordering medications, tests or procedures, and documenting clinical information in the EHR including the differential Dx, treatment, and any further evaluation and other management of neck abscess, htn, dm

## 2019-12-07 NOTE — Assessment & Plan Note (Signed)
Quite large now after onset 4-5 days, without evidence other significant cellulitis or drainage, non toxic, declines ED or UC care tonight, will give pain control, and refer to General surgury if will accept, o/w may need to see ENT

## 2019-12-08 ENCOUNTER — Ambulatory Visit: Payer: Medicare PPO | Admitting: Internal Medicine

## 2019-12-08 ENCOUNTER — Telehealth: Payer: Self-pay | Admitting: Internal Medicine

## 2019-12-08 DIAGNOSIS — L089 Local infection of the skin and subcutaneous tissue, unspecified: Secondary | ICD-10-CM | POA: Diagnosis not present

## 2019-12-08 DIAGNOSIS — L723 Sebaceous cyst: Secondary | ICD-10-CM | POA: Diagnosis not present

## 2019-12-08 NOTE — Telephone Encounter (Signed)
Patient requested a call

## 2019-12-13 NOTE — Telephone Encounter (Signed)
Spoke with pt and he has informed me that he is doing ok. Pt states he will be going back on Friday for tx.

## 2019-12-21 ENCOUNTER — Ambulatory Visit (INDEPENDENT_AMBULATORY_CARE_PROVIDER_SITE_OTHER): Payer: Medicare PPO | Admitting: *Deleted

## 2019-12-21 DIAGNOSIS — I5022 Chronic systolic (congestive) heart failure: Secondary | ICD-10-CM | POA: Diagnosis not present

## 2019-12-21 LAB — CUP PACEART REMOTE DEVICE CHECK
Battery Remaining Longevity: 62 mo
Battery Voltage: 2.99 V
Brady Statistic RV Percent Paced: 0.01 %
Date Time Interrogation Session: 20210812044222
HighPow Impedance: 67 Ohm
Implantable Lead Implant Date: 20140530
Implantable Lead Location: 753860
Implantable Lead Model: 6935
Implantable Pulse Generator Implant Date: 20140530
Lead Channel Impedance Value: 399 Ohm
Lead Channel Impedance Value: 418 Ohm
Lead Channel Pacing Threshold Amplitude: 0.5 V
Lead Channel Pacing Threshold Pulse Width: 0.4 ms
Lead Channel Sensing Intrinsic Amplitude: 7.125 mV
Lead Channel Sensing Intrinsic Amplitude: 7.125 mV
Lead Channel Setting Pacing Amplitude: 2.5 V
Lead Channel Setting Pacing Pulse Width: 0.4 ms
Lead Channel Setting Sensing Sensitivity: 0.3 mV

## 2019-12-25 NOTE — Progress Notes (Signed)
Remote ICD transmission.   

## 2019-12-28 DIAGNOSIS — H2512 Age-related nuclear cataract, left eye: Secondary | ICD-10-CM | POA: Diagnosis not present

## 2020-01-04 ENCOUNTER — Encounter: Payer: Self-pay | Admitting: Internal Medicine

## 2020-01-04 ENCOUNTER — Other Ambulatory Visit: Payer: Self-pay

## 2020-01-04 ENCOUNTER — Ambulatory Visit: Payer: Medicare PPO | Admitting: Internal Medicine

## 2020-01-04 VITALS — BP 138/80 | HR 78 | Ht 71.0 in | Wt 228.0 lb

## 2020-01-04 DIAGNOSIS — I10 Essential (primary) hypertension: Secondary | ICD-10-CM

## 2020-01-04 DIAGNOSIS — I472 Ventricular tachycardia, unspecified: Secondary | ICD-10-CM

## 2020-01-04 DIAGNOSIS — I5022 Chronic systolic (congestive) heart failure: Secondary | ICD-10-CM

## 2020-01-04 DIAGNOSIS — Z95810 Presence of automatic (implantable) cardiac defibrillator: Secondary | ICD-10-CM

## 2020-01-04 LAB — CUP PACEART INCLINIC DEVICE CHECK
Battery Remaining Longevity: 61 mo
Battery Voltage: 2.99 V
Brady Statistic RV Percent Paced: 0.01 %
Date Time Interrogation Session: 20210826171946
HighPow Impedance: 64 Ohm
Implantable Lead Implant Date: 20140530
Implantable Lead Location: 753860
Implantable Lead Model: 6935
Implantable Pulse Generator Implant Date: 20140530
Lead Channel Impedance Value: 456 Ohm
Lead Channel Impedance Value: 513 Ohm
Lead Channel Pacing Threshold Amplitude: 0.5 V
Lead Channel Pacing Threshold Pulse Width: 0.4 ms
Lead Channel Sensing Intrinsic Amplitude: 7.5 mV
Lead Channel Sensing Intrinsic Amplitude: 7.625 mV
Lead Channel Setting Pacing Amplitude: 2.5 V
Lead Channel Setting Pacing Pulse Width: 0.4 ms
Lead Channel Setting Sensing Sensitivity: 0.3 mV

## 2020-01-04 NOTE — Patient Instructions (Signed)
Medication Instructions:  Your physician recommends that you continue on your current medications as directed. Please refer to the Current Medication list given to you today.  Labwork: None ordered.  Testing/Procedures: None ordered.  Follow-Up: Your physician wants you to follow-up in: one year with Dr. Lovena Le.   You will receive a reminder letter in the mail two months in advance. If you don't receive a letter, please call our office to schedule the follow-up appointment.  Remote monitoring is used to monitor your ICD from home. This monitoring reduces the number of office visits required to check your device to one time per year. It allows [place] to keep an eye on the functioning of your device to ensure it is working properly. You are scheduled for a device check from home on 03/21/2020. You may send your transmission at any time that day. If you have a wireless device, the transmission will be sent automatically. After your physician reviews your transmission, you will receive a postcard with your next transmission date.  Any Other Special Instructions Will Be Listed Below (If Applicable).  If you need a refill on your cardiac medications before your next appointment, please call your pharmacy.

## 2020-01-04 NOTE — Progress Notes (Signed)
HPI Mr. Bury returns today for preoperative evaluation. He is a pleasant 69 yo man with chronic systolic heart failure, s/p ICD insertion, h/o VT, and HTN. He feels well. He has class 2 CHF symptoms. No ICD shocks. He has started back working but has not been able to lose weight despite an increase in activity. Allergies  Allergen Reactions  . Zantac [Ranitidine Hcl]     itching     Current Outpatient Medications  Medication Sig Dispense Refill  . ACCU-CHEK GUIDE test strip USE TO CHECK SUGAR UP TO 4 TIMES DAILY E11.9 100 strip 3  . amLODipine (NORVASC) 5 MG tablet TAKE 1 TABLET BY MOUTH EVERY DAY 90 tablet 1  . aspirin EC 81 MG tablet Take 81 mg by mouth daily.    [name] atorvastatin (LIPITOR) 20 MG tablet TAKE 1 TABLET BY MOUTH DAILY EXCEPT 1/2 TABLET ON TUESDAY, THURSDAY AND SATURDAY. 90 tablet 1  . blood glucose meter kit and supplies KIT Dispense based on patient and insurance preference. Use up to four times daily as directed. (Dx code- E11.9) 1 each 0  . carvedilol (COREG) 12.5 MG tablet TAKE 1 AND 1/2 TABLETS BY MOUTH TWICE A DAY WITH MEALS (MAX ON INS) 270 tablet 1  . clonazePAM (KLONOPIN) 0.5 MG tablet Take 1 tablet (0.5 mg total) by mouth at bedtime as needed for anxiety. 30 tablet 0  . digoxin (LANOXIN) 0.125 MG tablet TAKE 1 TABLET BY MOUTH EVERY DAY 90 tablet 1  . famotidine (PEPCID) 20 MG tablet TAKE 1 TABLET BY MOUTH TWICE A DAY 180 tablet 1  . HYDROcodone-acetaminophen (NORCO/VICODIN) 5-325 MG tablet Take 1 tablet by mouth every 6 (six) hours as needed. 30 tablet 0  . Lancets (ONETOUCH ULTRASOFT) lancets Use as instructed to test sugar daily and prn. Dx E11.9 100 each 12  . omeprazole (PRILOSEC) 20 MG capsule TAKE 1 CAPSULE BY MOUTH DAILY. RECOMMENDED TO TAKE 30 MIN BEFORE BREAKFAST. *MAX PER INSURANCE 90 capsule 1  . Prednisol Ace-Moxiflox-Bromfen 1-0.5-0.075 % SUSP Place 1 drop into the left eye 4 (four) times daily.    . ramipril (ALTACE) 10 MG capsule TAKE 1 CAPSULE  (10 MG TOTAL) BY MOUTH 2 (TWO) TIMES DAILY. 180 capsule 1  . sitaGLIPtin-metformin (JANUMET) 50-1000 MG tablet Take 1 tablet by mouth 2 (two) times daily with a meal. 180 tablet 1  . sotalol (BETAPACE) 120 MG tablet TAKE 1 TABLET BY MOUTH TWICE A DAY 180 tablet 1  . vitamin B-12 (CYANOCOBALAMIN) 1000 MCG tablet Take 1,000 mcg by mouth daily.     No current facility-administered medications for this visit.     Past Medical History:  Diagnosis Date  . AICD (automatic cardioverter/defibrillator) present   . Anxiety    since defibrillator placement  . Arthritis    knees  . Cataract   . Chronic systolic heart failure ([place])   . Colon polyps    tubular adenoma  . Diabetes mellitus   . Dysrhythmia   . GERD (gastroesophageal reflux disease)   . Hx of myocardial infarction   . Hyperlipidemia   . Hypertension   . NICM (nonischemic cardiomyopathy) ([place])    a.normal cors by cath in 2005. b. s/p Medtronic ICD.  [name] Pacemaker   . Prostate cancer ([place]) 06/2003   s/p prostatectomy  . S/P radiation therapy 10/10/2014 through 11/26/2014    Prostate bed 6600 cGy in 33 sessions   . Sleep apnea    no CPAP use  .  Ventricular tachycardia ([place])    a. s/p ICD (generator change 09/2012). b. 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 and placed on sotalol    ROS:   All systems reviewed and negative except as noted in the HPI.   Past Surgical History:  Procedure Laterality Date  . CARDIAC DEFIBRILLATOR PLACEMENT  03/11/2004   Medtronic Maximo single lead  . COLONOSCOPY W/ BIOPSIES AND POLYPECTOMY  08/2004, 02/24/11   92mm adenoma in 2006,  5 mm polyp (not recovered) 2012  . CYSTOSCOPY WITH DIRECT VISION INTERNAL URETHROTOMY N/A 10/08/2015   Procedure: CYSTOSCOPY WITH DIRECT VISION INTERNAL URETHROTOMY;  Surgeon: Nickie Retort, MD;  Location: [facility];  Service: Urology;  Laterality: N/A;  . IMPLANTABLE CARDIOVERTER DEFIBRILLATOR GENERATOR CHANGE N/A  10/07/2012   Procedure: IMPLANTABLE CARDIOVERTER DEFIBRILLATOR GENERATOR CHANGE;  Surgeon: Evans Lance, MD;  Location: [facility] CATH LAB;  Service: Cardiovascular;  Laterality: N/A;  . PROSTATECTOMY  06/2003   Dr Janice Norrie  . RECTAL SURGERY    . UPPER GASTROINTESTINAL ENDOSCOPY  08/2004, 02/24/11   Barrett's esophagus     Family History  Problem Relation Age of Onset  . Coronary artery disease Father   . Prostate cancer Father   . Diabetes Father   . Hypertension Mother   . Diabetes Paternal Grandmother   . Stroke Neg Hx   . Colon cancer Neg Hx   . Colon polyps Neg Hx   . Esophageal cancer Neg Hx   . Rectal cancer Neg Hx   . Stomach cancer Neg Hx      Social History   Socioeconomic History  . Marital status: Married    Spouse name: Not on file  . Number of children: 2  . Years of education: Not on file  . Highest education level: Not on file  Occupational History  . 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 Smoking status: Never Smoker  . Smokeless tobacco: Never Used  Vaping Use  . Vaping Use: Never used  Substance and Sexual Activity  . Alcohol use: No  . Drug use: No  . Sexual activity: Not on file  Other Topics Concern  . Not on file  Social History Narrative  . Not on file   Social Determinants of Health   Financial Resource Strain:   . Difficulty of Paying Living Expenses: Not on file  Food Insecurity:   . Worried About Charity fundraiser in the Last Year: Not on file  . Ran Out of Food in the Last Year: Not on file  Transportation Needs:   . Lack of Transportation (Medical): Not on file  . Lack of Transportation (Non-Medical): Not on file  Physical Activity:   . Days of Exercise per Week: Not on file  . Minutes of Exercise per Session: Not on file  Stress:   . Feeling of Stress : Not on file  Social Connections:   . Frequency of Communication with Friends and Family: Not on file  . Frequency of Social Gatherings with Friends and  Family: Not on file  . Attends Religious Services: Not on file  . Active Member of Clubs or Organizations: Not on file  . Attends Archivist Meetings: Not on file  . Marital Status: Not on file  Intimate Partner Violence:   . Fear of Current or Ex-Partner: Not on file  . Emotionally Abused: Not on file  . Physically Abused: Not on file  . Sexually Abused: Not on file  BP 138/80   Pulse 78   Ht $R'5\' 11"'Bg$  (1.803 m)   Wt 228 lb (103.4 kg)   SpO2 95%   BMI 31.80 kg/m   Physical Exam:  Well appearing NAD HEENT: Unremarkable Neck:  No JVD, no thyromegally Lymphatics:  No adenopathy Back:  No CVA tenderness Lungs:  Clear with no wheezes HEART:  Regular rate rhythm, no murmurs, no rubs, no clicks Abd:  soft, positive bowel sounds, no organomegally, no rebound, no guarding Ext:  2 plus pulses, no edema, no cyanosis, no clubbing Skin:  No rashes no nodules Neuro:  CN II through XII intact, motor grossly intact  EKG - NSR  DEVICE  Normal device function.  See PaceArt for details.   Assess/Plan: 1. Chronic systolic heart failure - his symptoms are class 2. He will continue his current meds. 2. ICD - his medtronic single chamber ICD is working normally. 3. VT - he has had no sustained episodes. He will undergo watchful waiting.   Salome Spotted.

## 2020-01-05 DIAGNOSIS — L723 Sebaceous cyst: Secondary | ICD-10-CM | POA: Diagnosis not present

## 2020-01-05 DIAGNOSIS — L089 Local infection of the skin and subcutaneous tissue, unspecified: Secondary | ICD-10-CM | POA: Diagnosis not present

## 2020-01-09 NOTE — Addendum Note (Signed)
Addended by: Rose Phi on: 01/09/2020 11:15 AM   Modules accepted: Orders

## 2020-01-23 ENCOUNTER — Ambulatory Visit: Payer: Medicare PPO | Attending: Internal Medicine

## 2020-01-23 DIAGNOSIS — Z23 Encounter for immunization: Secondary | ICD-10-CM

## 2020-01-23 MED FILL — FLUAD QUADRIVALENT 0.5 ML P: 0.5 | 1 days supply | Qty: 1 | Fill #0

## 2020-01-23 NOTE — Progress Notes (Signed)
   Covid-19 Vaccination Clinic  Name:  [name]    MRN: [id number] DOB: 03-Nov-1950  01/23/2020  Mr. Brazzel was observed post Covid-19 immunization for 15 minutes without incident. He was provided with Vaccine Information Sheet and instruction to access the V-Safe system. Vaccinated By: Budd Palmer.  Mr. Boley was instructed to call 911 with any severe reactions post vaccine: [name] Difficulty breathing  . Swelling of face and throat  . A fast heartbeat  . A bad rash all over body  . Dizziness and weakness

## 2020-02-07 ENCOUNTER — Other Ambulatory Visit: Payer: Self-pay | Admitting: Internal Medicine

## 2020-02-16 ENCOUNTER — Other Ambulatory Visit: Payer: Self-pay | Admitting: Internal Medicine

## 2020-02-28 ENCOUNTER — Other Ambulatory Visit: Payer: Self-pay | Admitting: Internal Medicine

## 2020-03-06 ENCOUNTER — Other Ambulatory Visit: Payer: Self-pay | Admitting: Internal Medicine

## 2020-03-21 ENCOUNTER — Ambulatory Visit (INDEPENDENT_AMBULATORY_CARE_PROVIDER_SITE_OTHER): Payer: Medicare PPO

## 2020-03-21 DIAGNOSIS — I428 Other cardiomyopathies: Secondary | ICD-10-CM | POA: Diagnosis not present

## 2020-03-21 LAB — CUP PACEART REMOTE DEVICE CHECK
Battery Remaining Longevity: 58 mo
Battery Voltage: 2.99 V
Brady Statistic RV Percent Paced: 0.01 %
Date Time Interrogation Session: 20211111033325
HighPow Impedance: 56 Ohm
Implantable Lead Implant Date: 20140530
Implantable Lead Location: 753860
Implantable Lead Model: 6935
Implantable Pulse Generator Implant Date: 20140530
Lead Channel Impedance Value: 399 Ohm
Lead Channel Impedance Value: 456 Ohm
Lead Channel Pacing Threshold Amplitude: 0.5 V
Lead Channel Pacing Threshold Pulse Width: 0.4 ms
Lead Channel Sensing Intrinsic Amplitude: 6.125 mV
Lead Channel Sensing Intrinsic Amplitude: 6.125 mV
Lead Channel Setting Pacing Amplitude: 2.5 V
Lead Channel Setting Pacing Pulse Width: 0.4 ms
Lead Channel Setting Sensing Sensitivity: 0.3 mV

## 2020-03-25 NOTE — Progress Notes (Signed)
Remote ICD transmission.   

## 2020-04-18 ENCOUNTER — Other Ambulatory Visit: Payer: Self-pay | Admitting: Internal Medicine

## 2020-04-20 ENCOUNTER — Other Ambulatory Visit: Payer: Self-pay | Admitting: Internal Medicine

## 2020-05-07 ENCOUNTER — Other Ambulatory Visit: Payer: Self-pay | Admitting: Internal Medicine

## 2020-05-15 ENCOUNTER — Other Ambulatory Visit: Payer: Self-pay | Admitting: Internal Medicine

## 2020-05-30 DIAGNOSIS — Z8546 Personal history of malignant neoplasm of prostate: Secondary | ICD-10-CM | POA: Diagnosis not present

## 2020-05-30 DIAGNOSIS — N35011 Post-traumatic bulbous urethral stricture: Secondary | ICD-10-CM | POA: Diagnosis not present

## 2020-06-20 ENCOUNTER — Ambulatory Visit (INDEPENDENT_AMBULATORY_CARE_PROVIDER_SITE_OTHER): Payer: Medicare PPO

## 2020-06-20 DIAGNOSIS — I428 Other cardiomyopathies: Secondary | ICD-10-CM

## 2020-06-20 LAB — CUP PACEART REMOTE DEVICE CHECK
Battery Remaining Longevity: 55 mo
Battery Voltage: 2.99 V
Brady Statistic RV Percent Paced: 0 %
Date Time Interrogation Session: 20220210074227
HighPow Impedance: 60 Ohm
Implantable Lead Implant Date: 20140530
Implantable Lead Location: 753860
Implantable Lead Model: 6935
Implantable Pulse Generator Implant Date: 20140530
Lead Channel Impedance Value: 361 Ohm
Lead Channel Impedance Value: 475 Ohm
Lead Channel Pacing Threshold Amplitude: 0.5 V
Lead Channel Pacing Threshold Pulse Width: 0.4 ms
Lead Channel Sensing Intrinsic Amplitude: 6.125 mV
Lead Channel Sensing Intrinsic Amplitude: 6.125 mV
Lead Channel Setting Pacing Amplitude: 2.5 V
Lead Channel Setting Pacing Pulse Width: 0.4 ms
Lead Channel Setting Sensing Sensitivity: 0.3 mV

## 2020-06-23 ENCOUNTER — Other Ambulatory Visit: Payer: Self-pay | Admitting: Internal Medicine

## 2020-06-27 NOTE — Progress Notes (Signed)
Remote ICD transmission.   

## 2020-07-05 DIAGNOSIS — Z961 Presence of intraocular lens: Secondary | ICD-10-CM | POA: Diagnosis not present

## 2020-07-05 DIAGNOSIS — H2511 Age-related nuclear cataract, right eye: Secondary | ICD-10-CM | POA: Diagnosis not present

## 2020-07-05 DIAGNOSIS — H43812 Vitreous degeneration, left eye: Secondary | ICD-10-CM | POA: Diagnosis not present

## 2020-07-29 ENCOUNTER — Telehealth: Payer: Self-pay | Admitting: Internal Medicine

## 2020-07-29 ENCOUNTER — Other Ambulatory Visit: Payer: Self-pay | Admitting: Internal Medicine

## 2020-07-29 ENCOUNTER — Other Ambulatory Visit: Payer: Self-pay

## 2020-07-29 MED ORDER — VITAMIN B-12 1000 MCG PO TABS: 1000.0000 ug | ORAL_TABLET | Freq: Every day | ORAL | 1 refills | Status: DC

## 2020-07-29 NOTE — Telephone Encounter (Signed)
[facility] calling to verify medications. They are requesting refill FBP:ZWCHENI B-12 (CYANOCOBALAMIN) 1000 MCG tablet sotalol (BETAPACE) 120 MG tablet omeprazole (PRILOSEC) 20 MG capsule famotidine (PEPCID) 20 MG tablet ACCU-CHEK GUIDE test strip   Please call [phone]

## 2020-07-29 NOTE — Telephone Encounter (Signed)
Faxed in today. 

## 2020-08-08 ENCOUNTER — Other Ambulatory Visit: Payer: Self-pay

## 2020-08-08 NOTE — Progress Notes (Signed)
Subjective:    Patient ID: [name], male    DOB: 06-16-1950, 70 y.o.   MRN: [id number]  HPI He is here for a physical exam.   He had cataract surgery last year.  At some point after that he started having not a floater, but a bigger object he sees that comes and goes and at times he will see what looks like something, like a rat run across the room. His eye doctor told him it could be related to alzheimer's.  His mom had that.   He does forget things.  He occasionally will see something that is not there - a dog he said.  He wonders what he can do to prevent memory issues.    He does not sleep well - watches tv too much.  He still works.   He does not exercise outside of work.  He is eating healthy.   He was dx with OSA years ago - not sure how bad it was.  He has not used a cpap in years.   Medications and allergies reviewed with patient and updated if appropriate.  Patient Active Problem List   Diagnosis Date Noted  . Chronic back pain 04/25/2019  . Neuralgia 10/19/2018  . Acute cystitis without hematuria 09/05/2018  . Chronic left-sided thoracic back pain 08/23/2018  . Right foot pain 12/15/2017  . Hip pain 12/14/2016  . Left knee pain 12/14/2016  . Shingles 07/28/2016  . GERD (gastroesophageal reflux disease) 07/29/2015  . Malignant neoplasm of prostate ([place]) 09/27/2014  . RLS (restless legs syndrome) 11/29/2012  . Obstructive sleep apnea 11/10/2012  . Elevated alkaline phosphatase level 10/04/2012  . Vitamin B 12 deficiency 03/06/2011  . Chronic systolic heart failure ([place]) 01/27/2011  . Mild anemia 01/20/2011  . Ventricular tachycardia ([place])   . NICM (nonischemic cardiomyopathy) ([place])   . Diabetes ([place]) 08/26/2009  . Hyperlipidemia 08/26/2009  . Essential hypertension 08/26/2009  . PROSTATE CANCER, HX OF 08/26/2009  . 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  . Barrett's esophagus 09/05/2004    Current Outpatient Medications on  File Prior to Visit  Medication Sig Dispense Refill  . ACCU-CHEK GUIDE test strip USE TO CHECK SUGAR UP TO 4 TIMES DAILY E11.9 100 strip 3  . amLODipine (NORVASC) 5 MG tablet TAKE 1 TABLET BY MOUTH DAILY. FOLLOW-UP APPT DUE IN DEC MUST SEE PROVIDER FOR FUTURE REFILLS 90 tablet 0  . aspirin EC 81 MG tablet Take 81 mg by mouth daily.    [name] atorvastatin (LIPITOR) 20 MG tablet TAKE 1 TABLET BY MOUTH DAILY EXCEPT 1/2 TABLET ON TUESDAY, THURSDAY AND SATURDAY. 90 tablet 1  . carvedilol (COREG) 12.5 MG tablet TAKE 1 AND 1/2 TABLETS BY MOUTH TWICE A DAY WITH MEALS (MAX ON INS) 270 tablet 1  . digoxin (LANOXIN) 0.125 MG tablet TAKE 1 TABLET BY MOUTH EVERY DAY 90 tablet 0  . famotidine (PEPCID) 20 MG tablet TAKE 1 TABLET BY MOUTH TWICE A DAY 180 tablet 1  . JANUMET 50-1000 MG tablet TAKE 1 TABLET BY MOUTH 2 (TWO) TIMES DAILY WITH A MEAL. 180 tablet 1  . Lancets (ONETOUCH ULTRASOFT) lancets Use as instructed to test sugar daily and prn. Dx E11.9 100 each 12  . omeprazole (PRILOSEC) 20 MG capsule TAKE 1 CAPSULE BY MOUTH DAILY. RECOMMENDED TO TAKE 30 MIN BEFORE BREAKFAST. *MAX PER INSURANCE 90 capsule 1  . Prednisol Ace-Moxiflox-Bromfen 1-0.5-0.075 % SUSP Place 1 drop into the left eye 4 (four) times daily.    [name]  ramipril (ALTACE) 10 MG capsule TAKE 1 CAPSULE (10 MG TOTAL) BY MOUTH 2 (TWO) TIMES DAILY. 180 capsule 1  . sotalol (BETAPACE) 120 MG tablet TAKE 1 TABLET BY MOUTH TWICE A DAY 180 tablet 1  . vitamin B-12 (CYANOCOBALAMIN) 1000 MCG tablet Take 1 tablet (1,000 mcg total) by mouth daily. 90 tablet 1   No current facility-administered medications on file prior to visit.    Past Medical History:  Diagnosis Date  . AICD (automatic cardioverter/defibrillator) present   . Anxiety    since defibrillator placement  . Arthritis    knees  . Cataract   . Chronic systolic heart failure ([place])   . Colon polyps    tubular adenoma  . Diabetes mellitus   . Dysrhythmia   . GERD (gastroesophageal reflux  disease)   . Hx of myocardial infarction   . Hyperlipidemia   . Hypertension   . NICM (nonischemic cardiomyopathy) ([place])    a.normal cors by cath in 2005. b. s/p Medtronic ICD.  [name] Pacemaker   . Prostate cancer ([place]) 06/2003   s/p prostatectomy  . S/P radiation therapy 10/10/2014 through 11/26/2014    Prostate bed 6600 cGy in 33 sessions   . Sleep apnea    no CPAP use  . Ventricular tachycardia ([place])    a. s/p ICD (generator change 09/2012). b. 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 and placed on sotalol    Past Surgical History:  Procedure Laterality Date  . CARDIAC DEFIBRILLATOR PLACEMENT  03/11/2004   Medtronic Maximo single lead  . COLONOSCOPY W/ BIOPSIES AND POLYPECTOMY  08/2004, 02/24/11   20mm adenoma in 2006,  5 mm polyp (not recovered) 2012  . CYSTOSCOPY WITH DIRECT VISION INTERNAL URETHROTOMY N/A 10/08/2015   Procedure: CYSTOSCOPY WITH DIRECT VISION INTERNAL URETHROTOMY;  Surgeon: Nickie Retort, MD;  Location: [facility];  Service: Urology;  Laterality: N/A;  . IMPLANTABLE CARDIOVERTER DEFIBRILLATOR GENERATOR CHANGE N/A 10/07/2012   Procedure: IMPLANTABLE CARDIOVERTER DEFIBRILLATOR GENERATOR CHANGE;  Surgeon: Evans Lance, MD;  Location[facility] C[facility] LAB;  Service: Cardiovascular;  Laterality: N/A;  . PROSTATECTOMY  06/2003   Dr Janice Norrie  . RECTAL SURGERY    . UPPER GASTROINTESTINAL ENDOSCOPY  08/2004, 02/24/11   Barrett's esophagus    Social History   Socioeconomic History  . Marital status: Married    Spouse name: Not on file  . Number of children: 2  . Years of education: Not on file  . Highest education level: Not on file  Occupational History  . 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 Smoking status: Never Smoker  . Smokeless tobacco: Never Used  Vaping Use  . Vaping Use: Never used  Substance and Sexual Activity  . Alcohol use: No  . Drug use: No  . Sexual activity: Not on file  Other  Topics Concern  . Not on file  Social History Narrative  . Not on file   Social Determinants of Health   Financial Resource Strain: Not on file  Food Insecurity: Not on file  Transportation Needs: Not on file  Physical Activity: Not on file  Stress: Not on file  Social Connections: Not on file    Family History  Problem Relation Age of Onset  . Coronary artery disease Father   . Prostate cancer Father   . Diabetes Father   . Hypertension Mother   . Diabetes Paternal Grandmother   . Stroke Neg Hx   . Colon cancer Neg Hx   .  Colon polyps Neg Hx   . Esophageal cancer Neg Hx   . Rectal cancer Neg Hx   . Stomach cancer Neg Hx     Review of Systems  Constitutional: Negative for appetite change, chills and fever.  Eyes: Positive for visual disturbance.  Respiratory: Negative for cough, shortness of breath and wheezing.   Cardiovascular: Negative for chest pain, palpitations and leg swelling.  Gastrointestinal: Negative for abdominal pain, blood in stool, constipation, diarrhea and nausea.       Gerd controlled  Genitourinary: Negative for difficulty urinating, dysuria and hematuria.  Musculoskeletal: Positive for back pain (occ, lower back). Negative for arthralgias.  Skin: Negative for color change and rash.  Neurological: Negative for dizziness, light-headedness and headaches.  Psychiatric/Behavioral: Positive for hallucinations (occ sees things that are not there - dog). Negative for dysphoric mood and sleep disturbance. The patient is not nervous/anxious.        Objective:   Vitals:   08/09/20 0825  BP: 128/70  Pulse: 64  Temp: 98.6 F (37 C)  SpO2: 97%   Filed Weights   08/09/20 0825  Weight: 224 lb (101.6 kg)   Body mass index is 31.24 kg/m.  BP Readings from Last 3 Encounters:  08/09/20 128/70  01/04/20 138/80  12/07/19 128/82    Wt Readings from Last 3 Encounters:  08/09/20 224 lb (101.6 kg)  01/04/20 228 lb (103.4 kg)  12/07/19 (!) 229 lb  (103.9 kg)     Physical Exam Constitutional: He appears well-developed and well-nourished. No distress.  HENT:  Head: Normocephalic and atraumatic.  Right Ear: External ear normal.  Left Ear: External ear normal.  Mouth/Throat: Oropharynx is clear and moist.  Normal ear canals and TM b/l  Eyes: Conjunctivae and EOM are normal.  Neck: Neck supple. No tracheal deviation present. No thyromegaly present.  No carotid bruit  Cardiovascular: Normal rate, regular rhythm, normal heart sounds and intact distal pulses.   No murmur heard. Pulmonary/Chest: Effort normal and breath sounds normal. No respiratory distress. He has no wheezes. He has no rales.  Abdominal: Soft. He exhibits no distension. There is no tenderness.  Genitourinary: deferred  Musculoskeletal: He exhibits no edema.  Lymphadenopathy:   He has no cervical adenopathy.  Skin: Skin is warm and dry. He is not diaphoretic.  Psychiatric: He has a normal mood and affect. His behavior is normal.         Assessment & Plan:   Physical exam: Screening blood work  ordered Immunizations discussed COVID booster, discussed Shingrix Colonoscopy   Up to date  Eye exams   Up to date - dr Katy Fitch Exercise   Gets exercise at work Weight  He has lost a few lbs since he was here last - encouraged additional weight loss Substance abuse   none   See Problem List for Assessment and Plan of chronic medical problems.   This visit occurred during the SARS-CoV-2 public health emergency.  Safety protocols were in place, including screening questions prior to the visit, additional usage of staff PPE, and extensive cleaning of exam room while observing appropriate contact time as indicated for disinfecting solutions.

## 2020-08-08 NOTE — Patient Instructions (Addendum)
Blood work was ordered.     Medications changes include :   none    A referral was ordered for neurology.       Someone from their office will call you to schedule an appointment.    Please followup in 6 months    Health Maintenance, Male Adopting a healthy lifestyle and getting preventive care are important in promoting health and wellness. Ask your health care provider about:  The right schedule for you to have regular tests and exams.  Things you can do on your own to prevent diseases and keep yourself healthy. What should I know about diet, weight, and exercise? Eat a healthy diet  Eat a diet that includes plenty of vegetables, fruits, low-fat dairy products, and lean protein.  Do not eat a lot of foods that are high in solid fats, added sugars, or sodium.   Maintain a healthy weight Body mass index (BMI) is a measurement that can be used to identify possible weight problems. It estimates body fat based on height and weight. Your health care provider can help determine your BMI and help you achieve or maintain a healthy weight. Get regular exercise Get regular exercise. This is one of the most important things you can do for your health. Most adults should:  Exercise for at least 150 minutes each week. The exercise should increase your heart rate and make you sweat (moderate-intensity exercise).  Do strengthening exercises at least twice a week. This is in addition to the moderate-intensity exercise.  Spend less time sitting. Even light physical activity can be beneficial. Watch cholesterol and blood lipids Have your blood tested for lipids and cholesterol at 69 years of age, then have this test every 5 years. You may need to have your cholesterol levels checked more often if:  Your lipid or cholesterol levels are high.  You are older than 69 years of age.  You are at high risk for heart disease. What should I know about cancer screening? Many types of cancers can  be detected early and may often be prevented. Depending on your health history and family history, you may need to have cancer screening at various ages. This may include screening for:  Colorectal cancer.  Prostate cancer.  Skin cancer.  Lung cancer. What should I know about heart disease, diabetes, and high blood pressure? Blood pressure and heart disease  High blood pressure causes heart disease and increases the risk of stroke. This is more likely to develop in people who have high blood pressure readings, are of African descent, or are overweight.  Talk with your health care provider about your target blood pressure readings.  Have your blood pressure checked: ? Every 3-5 years if you are 92-64 years of age. ? Every year if you are 53 years old or older.  If you are between the ages of 62 and 81 and are a current or former smoker, ask your health care provider if you should have a one-time screening for abdominal aortic aneurysm (AAA). Diabetes Have regular diabetes screenings. This checks your fasting blood sugar level. Have the screening done:  Once every three years after age 9 if you are at a normal weight and have a low risk for diabetes.  More often and at a younger age if you are overweight or have a high risk for diabetes. What should I know about preventing infection? Hepatitis B If you have a higher risk for hepatitis B, you should be screened for this  virus. Talk with your health care provider to find out if you are at risk for hepatitis B infection. Hepatitis C Blood testing is recommended for:  Everyone born from [birth year] through 1964.  Anyone with known risk factors for hepatitis C. Sexually transmitted infections (STIs)  You should be screened each year for STIs, including gonorrhea and chlamydia, if: ? You are sexually active and are younger than 69 years of age. ? You are older than 69 years of age and your health care provider tells you that you are at risk  for this type of infection. ? Your sexual activity has changed since you were last screened, and you are at increased risk for chlamydia or gonorrhea. Ask your health care provider if you are at risk.  Ask your health care provider about whether you are at high risk for HIV. Your health care provider may recommend a prescription medicine to help prevent HIV infection. If you choose to take medicine to prevent HIV, you should first get tested for HIV. You should then be tested every 3 months for as long as you are taking the medicine. Follow these instructions at home: Lifestyle  Do not use any products that contain nicotine or tobacco, such as cigarettes, e-cigarettes, and chewing tobacco. If you need help quitting, ask your health care provider.  Do not use street drugs.  Do not share needles.  Ask your health care provider for help if you need support or information about quitting drugs. Alcohol use  Do not drink alcohol if your health care provider tells you not to drink.  If you drink alcohol: ? Limit how much you have to 0-2 drinks a day. ? Be aware of how much alcohol is in your drink. In the [place] one drink equals one 12 oz bottle of beer (355 mL), one 5 oz glass of wine (148 mL), or one 1 oz glass of hard liquor (44 mL). General instructions  Schedule regular health, dental, and eye exams.  Stay current with your vaccines.  Tell your health care provider if: ? You often feel depressed. ? You have ever been abused or do not feel safe at home. Summary  Adopting a healthy lifestyle and getting preventive care are important in promoting health and wellness.  Follow your health care provider's instructions about healthy diet, exercising, and getting tested or screened for diseases.  Follow your health care provider's instructions on monitoring your cholesterol and blood pressure. This information is not intended to replace advice given to you by your health care provider.  Make sure you discuss any questions you have with your health care provider. Document Revised: 04/20/2018 Document Reviewed: 04/20/2018 Elsevier Patient Education  2021 [employer].

## 2020-08-09 ENCOUNTER — Ambulatory Visit (INDEPENDENT_AMBULATORY_CARE_PROVIDER_SITE_OTHER): Payer: Medicare PPO | Admitting: Internal Medicine

## 2020-08-09 ENCOUNTER — Other Ambulatory Visit: Payer: Self-pay

## 2020-08-09 ENCOUNTER — Encounter: Payer: Self-pay | Admitting: Internal Medicine

## 2020-08-09 VITALS — BP 128/70 | HR 64 | Temp 98.6°F | Ht 71.0 in | Wt 224.0 lb

## 2020-08-09 DIAGNOSIS — I10 Essential (primary) hypertension: Secondary | ICD-10-CM | POA: Diagnosis not present

## 2020-08-09 DIAGNOSIS — K219 Gastro-esophageal reflux disease without esophagitis: Secondary | ICD-10-CM | POA: Diagnosis not present

## 2020-08-09 DIAGNOSIS — R413 Other amnesia: Secondary | ICD-10-CM | POA: Diagnosis not present

## 2020-08-09 DIAGNOSIS — E538 Deficiency of other specified B group vitamins: Secondary | ICD-10-CM

## 2020-08-09 DIAGNOSIS — Z0000 Encounter for general adult medical examination without abnormal findings: Secondary | ICD-10-CM

## 2020-08-09 DIAGNOSIS — I5022 Chronic systolic (congestive) heart failure: Secondary | ICD-10-CM

## 2020-08-09 DIAGNOSIS — G2581 Restless legs syndrome: Secondary | ICD-10-CM | POA: Diagnosis not present

## 2020-08-09 DIAGNOSIS — G4733 Obstructive sleep apnea (adult) (pediatric): Secondary | ICD-10-CM

## 2020-08-09 DIAGNOSIS — D649 Anemia, unspecified: Secondary | ICD-10-CM

## 2020-08-09 DIAGNOSIS — E1151 Type 2 diabetes mellitus with diabetic peripheral angiopathy without gangrene: Secondary | ICD-10-CM | POA: Diagnosis not present

## 2020-08-09 DIAGNOSIS — E7849 Other hyperlipidemia: Secondary | ICD-10-CM

## 2020-08-09 LAB — COMPREHENSIVE METABOLIC PANEL
ALT: 14 U/L (ref 0–53)
AST: 14 U/L (ref 0–37)
Albumin: 4 g/dL (ref 3.5–5.2)
Alkaline Phosphatase: 126 U/L — ABNORMAL HIGH (ref 39–117)
BUN: 12 mg/dL (ref 6–23)
CO2: 30 mEq/L (ref 19–32)
Calcium: 9.1 mg/dL (ref 8.4–10.5)
Chloride: 106 mEq/L (ref 96–112)
Creatinine, Ser: 1.07 mg/dL (ref 0.40–1.50)
GFR: 70.95 mL/min (ref >60.00)
Glucose, Bld: 132 mg/dL — ABNORMAL HIGH (ref 70–99)
Potassium: 4 mEq/L (ref 3.5–5.1)
Sodium: 141 mEq/L (ref 135–145)
Total Bilirubin: 0.5 mg/dL (ref 0.2–1.2)
Total Protein: 7.3 g/dL (ref 6.0–8.3)

## 2020-08-09 LAB — CBC WITH DIFFERENTIAL/PLATELET
Basophils Absolute: 0 10*3/uL (ref 0.0–0.1)
Basophils Relative: 0.4 % (ref 0.0–3.0)
Eosinophils Absolute: 0.1 10*3/uL (ref 0.0–0.7)
Eosinophils Relative: 1.9 % (ref 0.0–5.0)
HCT: 38.2 % — ABNORMAL LOW (ref 39.0–52.0)
Hemoglobin: 12.2 g/dL — ABNORMAL LOW (ref 13.0–17.0)
Lymphocytes Relative: 25.6 % (ref 12.0–46.0)
Lymphs Abs: 1.7 10*3/uL (ref 0.7–4.0)
MCHC: 32 g/dL (ref 30.0–36.0)
MCV: 72.2 fl — ABNORMAL LOW (ref 78.0–100.0)
Monocytes Absolute: 0.6 10*3/uL (ref 0.1–1.0)
Monocytes Relative: 8.5 % (ref 3.0–12.0)
Neutro Abs: 4.1 10*3/uL (ref 1.4–7.7)
Neutrophils Relative %: 63.6 % (ref 43.0–77.0)
Platelets: 224 10*3/uL (ref 150.0–400.0)
RBC: 5.29 Mil/uL (ref 4.22–5.81)
RDW: 16.1 % — ABNORMAL HIGH (ref 11.5–15.5)
WBC: 6.5 10*3/uL (ref 4.0–10.5)

## 2020-08-09 LAB — HEMOGLOBIN A1C: Hgb A1c MFr Bld: 6.7 % — ABNORMAL HIGH (ref 4.6–6.5)

## 2020-08-09 LAB — VITAMIN B12: Vitamin B-12: 1033 pg/mL — ABNORMAL HIGH (ref 211–911)

## 2020-08-09 LAB — LIPID PANEL
Cholesterol: 95 mg/dL (ref 0–200)
HDL: 32.8 mg/dL — ABNORMAL LOW (ref >39.00)
LDL Cholesterol: 54 mg/dL (ref 0–99)
NonHDL: 62.59
Total CHOL/HDL Ratio: 3
Triglycerides: 43 mg/dL (ref 0.0–149.0)
VLDL: 8.6 mg/dL (ref 0.0–40.0)

## 2020-08-09 LAB — TSH: TSH: 2.9 u[IU]/mL (ref 0.35–4.50)

## 2020-08-09 MED ORDER — JANUMET 50-1000 MG PO TABS: 2.0000 | ORAL_TABLET | Freq: Every day | ORAL | 1 refills | Status: DC

## 2020-08-09 NOTE — Assessment & Plan Note (Signed)
Chronic History of Barrett's esophagus EGD up-to-date GERD controlled Continue omeprazole 20 mg daily and famotidine 20 mg twice daily

## 2020-08-09 NOTE — Assessment & Plan Note (Addendum)
Chronic Lab Results  Component Value Date   HGBA1C 7.1 (H) 10/17/2019   Currently controlled-goal is A1c less than 7 Check A1c Continue Janumet 50-1000 mg - only take once a day - will see after a1c if he needs to take 2 tabs with dinner.   Continue diabetic diet, encourage regular exercise

## 2020-08-09 NOTE — Assessment & Plan Note (Signed)
Chronic Check lipid panel  Continue atorvastatin 20 mg 4 days a week and 10 mg 3 days a week Regular exercise and healthy diet encouraged

## 2020-08-09 NOTE — Assessment & Plan Note (Signed)
Acute Mom had alzheimer's  He does forget things, states occ hallucination and visual disturbance Discussed healthy lifestyle, healthy diet, good sleep, avoiding too much alcohol, smoking, exercising body and brain Check tsh, b12, basic labs Advised treating osa Will refer to neuro for eval of osa and possible memory issues

## 2020-08-09 NOTE — Assessment & Plan Note (Signed)
Chronic Dating back to at least 2011 CBC, iron panel, B12

## 2020-08-09 NOTE — Assessment & Plan Note (Signed)
Neck Euvolemic on exam Following with cardiology

## 2020-08-09 NOTE — Assessment & Plan Note (Signed)
Chronic Taking B12 daily by mouth Check B12 level

## 2020-08-09 NOTE — Assessment & Plan Note (Addendum)
Chronic Controlled Has not needed any medication

## 2020-08-09 NOTE — Assessment & Plan Note (Signed)
Chronic Diagnosed years ago - ? Severity Has not been on cpap for years Discussed consequences of untreated OSA Will refer to Dr Brett Fairy for eval of osa and possible memory issues Stressed turning off the tv - good sleep hygiene

## 2020-08-09 NOTE — Assessment & Plan Note (Signed)
Chronic BP well controlled Continue amlodipine 5 mg daily, carvedilol 18.5 twice daily, ramipril grams twice daily and sotalol 140 mg twice daily cmp

## 2020-08-10 LAB — IRON,TIBC AND FERRITIN PANEL
%SAT: 27 % (calc) (ref 20–48)
Ferritin: 98 ng/mL (ref 24–380)
Iron: 74 ug/dL (ref 50–180)
TIBC: 271 mcg/dL (calc) (ref 250–425)

## 2020-08-21 DIAGNOSIS — E119 Type 2 diabetes mellitus without complications: Secondary | ICD-10-CM | POA: Diagnosis not present

## 2020-08-21 DIAGNOSIS — Z961 Presence of intraocular lens: Secondary | ICD-10-CM | POA: Diagnosis not present

## 2020-08-21 DIAGNOSIS — H43812 Vitreous degeneration, left eye: Secondary | ICD-10-CM | POA: Diagnosis not present

## 2020-08-21 DIAGNOSIS — H40013 Open angle with borderline findings, low risk, bilateral: Secondary | ICD-10-CM | POA: Diagnosis not present

## 2020-08-21 DIAGNOSIS — H2511 Age-related nuclear cataract, right eye: Secondary | ICD-10-CM | POA: Diagnosis not present

## 2020-08-21 DIAGNOSIS — H04123 Dry eye syndrome of bilateral lacrimal glands: Secondary | ICD-10-CM | POA: Diagnosis not present

## 2020-09-03 ENCOUNTER — Ambulatory Visit: Payer: Medicare PPO | Attending: Internal Medicine

## 2020-09-03 DIAGNOSIS — Z20822 Contact with and (suspected) exposure to covid-19: Secondary | ICD-10-CM

## 2020-09-04 LAB — SARS-COV-2, NAA 2 DAY TAT

## 2020-09-04 LAB — NOVEL CORONAVIRUS, NAA: SARS-CoV-2, NAA: NOT DETECTED

## 2020-09-06 ENCOUNTER — Ambulatory Visit: Payer: Medicare PPO | Attending: Internal Medicine

## 2020-09-06 DIAGNOSIS — Z20822 Contact with and (suspected) exposure to covid-19: Secondary | ICD-10-CM | POA: Diagnosis not present

## 2020-09-07 ENCOUNTER — Telehealth: Payer: Self-pay | Admitting: Family Medicine

## 2020-09-07 LAB — NOVEL CORONAVIRUS, NAA: SARS-CoV-2, NAA: DETECTED — AB

## 2020-09-07 LAB — SARS-COV-2, NAA 2 DAY TAT

## 2020-09-07 MED ORDER — NIRMATRELVIR/RITONAVIR (PAXLOVID)TABLET: 3.0000 | ORAL_TABLET | Freq: Two times a day (BID) | ORAL | 0 refills | Status: DC

## 2020-09-07 MED ORDER — NIRMATRELVIR/RITONAVIR (PAXLOVID)TABLET: 3.0000 | ORAL_TABLET | Freq: Two times a day (BID) | ORAL | 0 refills | Status: AC

## 2020-09-07 NOTE — Telephone Encounter (Signed)
Received call about patient who tested positive for COVID 09/06/2020. First day of symptoms was 09/04/2020 Risk factors include HTN, DM, prostate cancer, afib with pacer.  No recent chemotherapy.  Current symptoms include cough, rhinorrhea, fatigue, body aches and ST. No fever.  Wife, daughter, granddaughter also positive.   COVID vaccine Pfizer x3, with booster 01/2020.   Lab Results  Component Value Date   CREATININE 1.07 08/09/2020   BUN 12 08/09/2020   NA 141 08/09/2020   K 4.0 08/09/2020   CL 106 08/09/2020   CO2 30 08/09/2020   GFR 71.   Spoke with patient, his wife and his daughter (all on call).  Will Rx full dose Paxlovid - sent to pharmacy.  Drug interactions: rec hold atorvastatin while on paxlovid, decrease digoxin to QOD while on paxlovid, hold omeprazole while on paxlovid.  Discussed below: Let us know right away if any worsening shortness of breath or persistent productive cough or fever. Reviewed red flags to seek urgent care.   Follow these current CDC guidelines for self-isolation: - Stay home for 5 days, starting the day after your symptoms (The first day is really day 0). - If you have no symptoms or your symptoms are resolving after 5 days, you can leave your house. - Continue to wear a mask around others for 5 additional days. **If you have a fever, continue to stay home until your fever resolves for 24 hours without fever-reducing medications.**  Also recommended  A. Zinc Lozenge 75 to 100mg  daily B. Vitamin C 500mg  twice a day C. Vitamin D (Cholecalciferol) 2000 IU daily

## 2020-09-09 ENCOUNTER — Telehealth: Payer: Self-pay | Admitting: Internal Medicine

## 2020-09-09 ENCOUNTER — Other Ambulatory Visit: Payer: Self-pay

## 2020-09-09 MED ORDER — ASPIRIN EC 81 MG PO TBEC: 81.0000 mg | DELAYED_RELEASE_TABLET | Freq: Every day | ORAL | 2 refills | Status: DC

## 2020-09-09 NOTE — Telephone Encounter (Signed)
Left message for patient today to continue medication prescribed and to let us know if he was not doing any better.

## 2020-09-09 NOTE — Telephone Encounter (Signed)
Patient called in regards to his recent Covid 19 test results. He can be reached at [phone]. Please advise

## 2020-09-09 NOTE — Telephone Encounter (Signed)
He was prescribed paxlovid over the weekend - is he taking this?

## 2020-09-09 NOTE — Telephone Encounter (Signed)
Sent in today 

## 2020-09-09 NOTE — Telephone Encounter (Signed)
[facility] FYI:   ---Caller states father has COVID yesterday positive. States he has a cough, runny nose, fatigue, body aches, and sore throat, symptoms started 3 days ago, on wed, no fever  Advised to called PCP

## 2020-09-09 NOTE — Telephone Encounter (Signed)
1.Medication Requested: aspirin EC 81 MG tablet    2. Pharmacy (Name, [address], [place]): [facility] [phone] - [place], [place] - [address] AT [facility]  3. On Med List: yes   4. Last Visit with PCP: 08-09-20  5. Next visit date with PCP: 02-07-21   Agent: Please be advised that RX refills may take up to 3 business days. We ask that you follow-up with your pharmacy.

## 2020-09-19 ENCOUNTER — Ambulatory Visit (INDEPENDENT_AMBULATORY_CARE_PROVIDER_SITE_OTHER): Payer: Medicare PPO

## 2020-09-19 DIAGNOSIS — I428 Other cardiomyopathies: Secondary | ICD-10-CM

## 2020-09-19 LAB — CUP PACEART REMOTE DEVICE CHECK
Battery Remaining Longevity: 51 mo
Battery Voltage: 2.98 V
Brady Statistic RV Percent Paced: 0.01 %
Date Time Interrogation Session: 20220512033625
HighPow Impedance: 65 Ohm
Implantable Lead Implant Date: 20140530
Implantable Lead Location: 753860
Implantable Lead Model: 6935
Implantable Pulse Generator Implant Date: 20140530
Lead Channel Impedance Value: 418 Ohm
Lead Channel Impedance Value: 475 Ohm
Lead Channel Pacing Threshold Amplitude: 0.5 V
Lead Channel Pacing Threshold Pulse Width: 0.4 ms
Lead Channel Sensing Intrinsic Amplitude: 6 mV
Lead Channel Sensing Intrinsic Amplitude: 6 mV
Lead Channel Setting Pacing Amplitude: 2.5 V
Lead Channel Setting Pacing Pulse Width: 0.4 ms
Lead Channel Setting Sensing Sensitivity: 0.3 mV

## 2020-09-25 DIAGNOSIS — E119 Type 2 diabetes mellitus without complications: Secondary | ICD-10-CM | POA: Diagnosis not present

## 2020-09-25 DIAGNOSIS — H40013 Open angle with borderline findings, low risk, bilateral: Secondary | ICD-10-CM | POA: Diagnosis not present

## 2020-09-25 DIAGNOSIS — H04123 Dry eye syndrome of bilateral lacrimal glands: Secondary | ICD-10-CM | POA: Diagnosis not present

## 2020-09-25 DIAGNOSIS — H2511 Age-related nuclear cataract, right eye: Secondary | ICD-10-CM | POA: Diagnosis not present

## 2020-09-25 DIAGNOSIS — Z961 Presence of intraocular lens: Secondary | ICD-10-CM | POA: Diagnosis not present

## 2020-09-25 DIAGNOSIS — H43812 Vitreous degeneration, left eye: Secondary | ICD-10-CM | POA: Diagnosis not present

## 2020-09-30 ENCOUNTER — Ambulatory Visit: Payer: Medicare PPO

## 2020-09-30 NOTE — Progress Notes (Signed)
   Covid-19 Vaccination Clinic  Name:  KARIEM WOLFSON    MRN: [id number] DOB: Jun 09, 1950  09/30/2020  Mr. Vuncannon was observed post Covid-19 immunization for 15 minutes without incident. He was provided with Vaccine Information Sheet and instruction to access the V-Safe system.   Mr. Worley was instructed to call 911 with any severe reactions post vaccine: [name] Difficulty breathing  . Swelling of face and throat  . A fast heartbeat  . A bad rash all over body  . Dizziness and weakness

## 2020-10-01 ENCOUNTER — Encounter: Payer: Self-pay | Admitting: Internal Medicine

## 2020-10-04 ENCOUNTER — Other Ambulatory Visit (HOSPITAL_BASED_OUTPATIENT_CLINIC_OR_DEPARTMENT_OTHER): Payer: Self-pay

## 2020-10-04 MED ORDER — PFIZER-BIONT COVID-19 VAC-TRIS 30 MCG/0.3ML IM SUSP
INTRAMUSCULAR | 0 refills | Status: DC
  Filled 2020-10-04: qty 0.3, 1d supply, fill #0

## 2020-10-05 ENCOUNTER — Other Ambulatory Visit: Payer: Self-pay | Admitting: Internal Medicine

## 2020-10-10 ENCOUNTER — Ambulatory Visit: Payer: Medicare PPO | Admitting: Internal Medicine

## 2020-10-11 NOTE — Progress Notes (Signed)
Remote ICD transmission.   

## 2020-10-18 ENCOUNTER — Ambulatory Visit: Payer: Medicare PPO

## 2020-11-01 ENCOUNTER — Ambulatory Visit: Payer: Medicare PPO

## 2020-11-14 ENCOUNTER — Encounter: Payer: Self-pay | Admitting: Internal Medicine

## 2020-11-14 ENCOUNTER — Ambulatory Visit (INDEPENDENT_AMBULATORY_CARE_PROVIDER_SITE_OTHER): Payer: Medicare PPO

## 2020-11-14 ENCOUNTER — Ambulatory Visit: Payer: Medicare PPO | Admitting: Internal Medicine

## 2020-11-14 ENCOUNTER — Other Ambulatory Visit: Payer: Self-pay

## 2020-11-14 VITALS — BP 122/60 | HR 84 | Temp 97.6°F | Ht 71.0 in | Wt 194.0 lb

## 2020-11-14 DIAGNOSIS — R634 Abnormal weight loss: Secondary | ICD-10-CM | POA: Diagnosis not present

## 2020-11-14 DIAGNOSIS — I10 Essential (primary) hypertension: Secondary | ICD-10-CM | POA: Diagnosis not present

## 2020-11-14 DIAGNOSIS — I5022 Chronic systolic (congestive) heart failure: Secondary | ICD-10-CM

## 2020-11-14 DIAGNOSIS — Z0000 Encounter for general adult medical examination without abnormal findings: Secondary | ICD-10-CM

## 2020-11-14 DIAGNOSIS — R413 Other amnesia: Secondary | ICD-10-CM

## 2020-11-14 DIAGNOSIS — E559 Vitamin D deficiency, unspecified: Secondary | ICD-10-CM

## 2020-11-14 DIAGNOSIS — Z8546 Personal history of malignant neoplasm of prostate: Secondary | ICD-10-CM | POA: Diagnosis not present

## 2020-11-14 DIAGNOSIS — E538 Deficiency of other specified B group vitamins: Secondary | ICD-10-CM

## 2020-11-14 DIAGNOSIS — E1151 Type 2 diabetes mellitus with diabetic peripheral angiopathy without gangrene: Secondary | ICD-10-CM | POA: Diagnosis not present

## 2020-11-14 LAB — URINALYSIS, ROUTINE W REFLEX MICROSCOPIC
Bilirubin Urine: NEGATIVE
Hgb urine dipstick: NEGATIVE
Leukocytes,Ua: NEGATIVE
Nitrite: NEGATIVE
RBC / HPF: NONE SEEN (ref 0–?)
Specific Gravity, Urine: >=1.03 — AB (ref 1.000–1.030)
Total Protein, Urine: NEGATIVE
Urine Glucose: NEGATIVE
Urobilinogen, UA: 0.2 (ref 0.0–1.0)
pH: 5.5 (ref 5.0–8.0)

## 2020-11-14 LAB — CBC WITH DIFFERENTIAL/PLATELET
Basophils Absolute: 0 10*3/uL (ref 0.0–0.1)
Basophils Relative: 0.2 % (ref 0.0–3.0)
Eosinophils Absolute: 0.1 10*3/uL (ref 0.0–0.7)
Eosinophils Relative: 1.1 % (ref 0.0–5.0)
HCT: 34.4 % — ABNORMAL LOW (ref 39.0–52.0)
Hemoglobin: 11 g/dL — ABNORMAL LOW (ref 13.0–17.0)
Lymphocytes Relative: 20.8 % (ref 12.0–46.0)
Lymphs Abs: 1.6 10*3/uL (ref 0.7–4.0)
MCHC: 32.1 g/dL (ref 30.0–36.0)
MCV: 71.8 fl — ABNORMAL LOW (ref 78.0–100.0)
Monocytes Absolute: 0.6 10*3/uL (ref 0.1–1.0)
Monocytes Relative: 7.7 % (ref 3.0–12.0)
Neutro Abs: 5.4 10*3/uL (ref 1.4–7.7)
Neutrophils Relative %: 70.2 % (ref 43.0–77.0)
Platelets: 230 10*3/uL (ref 150.0–400.0)
RBC: 4.79 Mil/uL (ref 4.22–5.81)
RDW: 16.6 % — ABNORMAL HIGH (ref 11.5–15.5)
WBC: 7.6 10*3/uL (ref 4.0–10.5)

## 2020-11-14 LAB — HEPATIC FUNCTION PANEL
ALT: 9 U/L (ref 0–53)
AST: 11 U/L (ref 0–37)
Albumin: 4.1 g/dL (ref 3.5–5.2)
Alkaline Phosphatase: 97 U/L (ref 39–117)
Bilirubin, Direct: 0.1 mg/dL (ref 0.0–0.3)
Total Bilirubin: 0.5 mg/dL (ref 0.2–1.2)
Total Protein: 6.9 g/dL (ref 6.0–8.3)

## 2020-11-14 LAB — BASIC METABOLIC PANEL
BUN: 14 mg/dL (ref 6–23)
CO2: 25 mEq/L (ref 19–32)
Calcium: 9.2 mg/dL (ref 8.4–10.5)
Chloride: 106 mEq/L (ref 96–112)
Creatinine, Ser: 0.89 mg/dL (ref 0.40–1.50)
GFR: 87.45 mL/min (ref >60.00)
Glucose, Bld: 98 mg/dL (ref 70–99)
Potassium: 4.2 mEq/L (ref 3.5–5.1)
Sodium: 143 mEq/L (ref 135–145)

## 2020-11-14 LAB — BRAIN NATRIURETIC PEPTIDE: Pro B Natriuretic peptide (BNP): 19 pg/mL (ref 0.0–100.0)

## 2020-11-14 LAB — VITAMIN D 25 HYDROXY (VIT D DEFICIENCY, FRACTURES): VITD: 34.68 ng/mL (ref 30.00–100.00)

## 2020-11-14 LAB — LIPID PANEL
Cholesterol: 108 mg/dL (ref 0–200)
HDL: 33.6 mg/dL — ABNORMAL LOW (ref >39.00)
LDL Cholesterol: 62 mg/dL (ref 0–99)
NonHDL: 74.17
Total CHOL/HDL Ratio: 3
Triglycerides: 63 mg/dL (ref 0.0–149.0)
VLDL: 12.6 mg/dL (ref 0.0–40.0)

## 2020-11-14 LAB — PSA: PSA: 0 ng/mL — ABNORMAL LOW (ref 0.10–4.00)

## 2020-11-14 LAB — VITAMIN B12: Vitamin B-12: 938 pg/mL — ABNORMAL HIGH (ref 211–911)

## 2020-11-14 LAB — TSH: TSH: 1.89 u[IU]/mL (ref 0.35–5.50)

## 2020-11-14 LAB — SEDIMENTATION RATE: Sed Rate: 18 mm/hr (ref 0–20)

## 2020-11-14 LAB — HEMOGLOBIN A1C: Hgb A1c MFr Bld: 6.4 % (ref 4.6–6.5)

## 2020-11-14 NOTE — Patient Instructions (Signed)
Please continue all other medications as before, and refills have been done if requested.  Please have the pharmacy call with any other refills you may need.  Please continue your efforts at being more active, low cholesterol diet, and weight control.  Please keep your appointments with your specialists as you may have planned  You will be contacted regarding the referral for: MRI , neurology, and the CT scans for the chest and abdomen/pelvis  Please go to the LAB at the blood drawing area for the tests to be done  You will be contacted by phone if any changes need to be made immediately.  Otherwise, you will receive a letter about your results with an explanation, but please check with MyChart first.  Please remember to sign up for MyChart if you have not done so, as this will be important to you in the future with finding out test results, communicating by private email, and scheduling acute appointments online when needed.  Please see Dr Quay Burow in 2 weeks, or sooner if needed

## 2020-11-14 NOTE — Progress Notes (Signed)
Patient ID: [name], male   DOB: [birth year], 70 y.o.   MRN: [id number]        Chief Complaint: follow up fatigue with general weakness, memory changes and wt loss 30 lbs       HPI:  [name] is a 70 y.o. male here with family present; all mention symptoms of 2-3 mo worsening memory difficulty mostly short term and asking same questions, having to be reminded of things he has not in past.  Pt denies chest pain, increased sob or doe, wheezing, orthopnea, PND, increased LE swelling, palpitations, dizziness or syncope.   Pt denies polydipsia, polyuria, or new focal neuro s/s.   Pt denies fever, night sweats, but has less appetite reminded by his family (pt denies this) as he very often does not eat dinner any longer most days.  Pt has documented lost 30 lbs since April.  Has seen urology with stable PSA and no known metatstatic prostate cancer.  Denies worsening reflux, abd pain, dysphagia, n/v, bowel change or blood.  Has also seemed to be generally weaker and fatigued .  Denies worsening depressive symptoms, suicidal ideation, or panic. Wt Readings from Last 3 Encounters:  11/14/20 194 lb (88 kg)  11/14/20 194 lb (88 kg)  08/09/20 224 lb (101.6 kg)   BP Readings from Last 3 Encounters:  11/14/20 122/60  11/14/20 122/60  08/09/20 128/70         Past Medical History:  Diagnosis Date   AICD (automatic cardioverter/defibrillator) present    Anxiety    since defibrillator placement   Arthritis    knees   Cataract    Chronic systolic heart failure (HCC)    Colon polyps    tubular adenoma   Diabetes mellitus    Dysrhythmia    GERD (gastroesophageal reflux disease)    Hx of myocardial infarction    Hyperlipidemia    Hypertension    NICM (nonischemic cardiomyopathy) ([place])    a.normal cors by cath in 2005. b. s/p Medtronic ICD.   Pacemaker    Prostate cancer [facility]) 06/2003   s/p prostatectomy   S/P radiation therapy 10/10/2014 through 11/26/2014                                                       Prostate bed 6600 cGy in 33 sessions                           Sleep apnea    no CPAP use   Ventricular tachycardia ([place])    a. s/p ICD (generator change 09/2012). b. 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 and placed on sotalol   Past Surgical History:  Procedure Laterality Date   CARDIAC DEFIBRILLATOR PLACEMENT  03/11/2004   Medtronic Maximo single lead   COLONOSCOPY W/ BIOPSIES AND POLYPECTOMY  08/2004, 02/24/11   21mm adenoma in 2006,  5 mm polyp (not recovered) 2012   [place] N/A 10/08/2015   Procedure: CYSTOSCOPY WITH DIRECT VISION INTERNAL URETHROTOMY;  Surgeon: Nickie Retort, MD;  Location: [facility] ORS;  Service: Urology;  Laterality: N/A;   IMPLANTABLE CARDIOVERTER DEFIBRILLATOR GENERATOR CHANGE N/A 10/07/2012   Procedure: IMPLANTABLE CARDIOVERTER DEFIBRILLATOR GENERATOR CHANGE;  Surgeon: Evans Lance, MD;  Location: [facility] CATH LAB;  Service: Cardiovascular;  Laterality: N/A;   PROSTATECTOMY  06/2003   Dr Janice Norrie   RECTAL SURGERY     UPPER GASTROINTESTINAL ENDOSCOPY  08/2004, 02/24/11   Barrett's esophagus    reports that he has never smoked. He has never used smokeless tobacco. He reports that he does not drink alcohol and does not use drugs. family history includes Coronary artery disease in his father; Diabetes in his father and paternal grandmother; Hypertension in his mother; Prostate cancer in his father. Allergies  Allergen Reactions   Zantac [Ranitidine Hcl]     itching   Current Outpatient Medications on File Prior to Visit  Medication Sig Dispense Refill   ACCU-CHEK GUIDE test strip USE TO CHECK SUGAR UP TO 4 TIMES DAILY E11.9 100 strip 3   amLODipine (NORVASC) 5 MG tablet TAKE 1 TABLET BY MOUTH DAILY. FOLLOW-UP APPT DUE IN DEC MUST SEE PROVIDER FOR FUTURE REFILLS 30 tablet 2   aspirin EC 81 MG tablet Take 1 tablet (81 mg total) by mouth daily. 30 tablet 2   atorvastatin (LIPITOR) 20 MG tablet TAKE 1 TABLET BY MOUTH DAILY EXCEPT 1/2  TABLET ON TUESDAY, THURSDAY AND SATURDAY. 90 tablet 1   carvedilol (COREG) 12.5 MG tablet TAKE 1 AND 1/2 TABLETS BY MOUTH TWICE A DAY WITH MEALS (MAX ON INS) 90 tablet 5   COVID-19 mRNA Vac-TriS, Pfizer, (PFIZER-BIONT COVID-19 VAC-TRIS) SUSP injection Inject into the muscle. 0.3 mL 0   digoxin (LANOXIN) 0.125 MG tablet TAKE 1 TABLET BY MOUTH EVERY DAY 30 tablet 2   famotidine (PEPCID) 20 MG tablet TAKE 1 TABLET BY MOUTH TWICE A DAY 180 tablet 1   JANUMET 50-1000 MG tablet TAKE 1 TABLET BY MOUTH TWICE A DAY WITH A MEAL 60 tablet 8   Lancets (ONETOUCH ULTRASOFT) lancets Use as instructed to test sugar daily and prn. Dx E11.9 100 each 12   omeprazole (PRILOSEC) 20 MG capsule TAKE 1 CAPSULE BY MOUTH DAILY. RECOMMENDED TO TAKE 30 MIN BEFORE BREAKFAST. *MAX PER INSURANCE 90 capsule 1   Prednisol Ace-Moxiflox-Bromfen 1-0.5-0.075 % SUSP Place 1 drop into the left eye 4 (four) times daily.     ramipril (ALTACE) 10 MG capsule TAKE 1 CAPSULE BY MOUTH 2 TIMES DAILY. 60 capsule 5   sotalol (BETAPACE) 120 MG tablet TAKE 1 TABLET BY MOUTH TWICE A DAY 180 tablet 1   vitamin B-12 (CYANOCOBALAMIN) 1000 MCG tablet Take 1 tablet (1,000 mcg total) by mouth daily. 90 tablet 1   No current facility-administered medications on file prior to visit.        ROS:  All others reviewed and negative.  Objective        PE:  BP 122/60   Pulse 84   Temp 97.6 F (36.4 C) (Oral)   Ht 5\' 11"  (1.803 m)   Wt 194 lb (88 kg)   SpO2 98%   BMI 27.06 kg/m                 Constitutional: Pt appears in NAD               HENT: Head: NCAT.                Right Ear: External ear normal.                 Left Ear: External ear normal.                Eyes: . Pupils are equal, round, and reactive to light. Conjunctivae and  EOM are normal               Nose: without d/c or deformity               Neck: Neck supple. Gross normal ROM               Cardiovascular: Normal rate and regular rhythm.                 Pulmonary/Chest: Effort  normal and breath sounds without rales or wheezing.                Abd:  Soft, NT, ND, + BS, no organomegaly               Neurological: Pt is alert. At baseline orientation, motor grossly intact               Skin: Skin is warm. No rashes, no other new lesions, LE edema - none               Psychiatric: Pt behavior is normal without agitation   Micro: none  Cardiac tracings I have personally interpreted today:  none  Pertinent Radiological findings (summarize): none   Lab Results  Component Value Date   WBC 7.6 11/14/2020   HGB 11.0 (L) 11/14/2020   HCT 34.4 (L) 11/14/2020   PLT 230.0 11/14/2020   GLUCOSE 98 11/14/2020   CHOL 108 11/14/2020   TRIG 63.0 11/14/2020   HDL 33.60 (L) 11/14/2020   LDLCALC 62 11/14/2020   ALT 9 11/14/2020   AST 11 11/14/2020   NA 143 11/14/2020   K 4.2 11/14/2020   CL 106 11/14/2020   CREATININE 0.89 11/14/2020   BUN 14 11/14/2020   CO2 25 11/14/2020   TSH 1.89 11/14/2020   PSA 0.00 Repeated and verified X2. (L) 11/14/2020   INR 1.03 12/31/2010   HGBA1C 6.4 11/14/2020   MICROALBUR 1.2 04/03/2014   Assessment/Plan:  [name] is a 70 y.o. Black or African American [2] male with  has a past medical history of AICD (automatic cardioverter/defibrillator) present, Anxiety, Arthritis, Cataract, Chronic systolic heart failure ([place]), Colon polyps, Diabetes mellitus, Dysrhythmia, GERD (gastroesophageal reflux disease), myocardial infarction, Hyperlipidemia, Hypertension, NICM (nonischemic cardiomyopathy) ([place]), Pacemaker, Prostate cancer ([place]) (06/2003), S/P radiation therapy (10/10/2014 through 11/26/2014                                                   ), Sleep apnea, and Ventricular tachycardia ([place]).  Chronic systolic heart failure (HCC) Exam benign, continue current med tx sotalol  PROSTATE CANCER, HX OF Also for psa with labs, f/u urology as planned  Diabetes [facility]) Lab Results  Component Value Date   HGBA1C 6.4 11/14/2020   Currently  Stable, pt to continue current medical treatment janumet but may need decreased med for further wt loss   Essential hypertension BP Readings from Last 3 Encounters:  11/14/20 122/60  11/14/20 122/60  08/09/20 128/70   Stable, pt to continue medical treatment coreg, altace, norvasc    Vitamin B 12 deficiency Lab Results  Component Value Date   VITAMINB12 938 (H) 11/14/2020   Stable, cont oral replacement - b12 1000 mcg qd   Memory loss With at least mild worsening recently, etiology unclear, for MRI  And neurology referral  Weight loss Rapid and seems  due to less calorie intake by not eating dinner most days for several months anymore though pt denies low appetite; now for CT chest/abd/pelvis to r/o underlying malignancy  Followup: Return in about 2 weeks (around 11/28/2020).  Cathlean Cower, MD 11/15/2020 8:21 PM [facility]

## 2020-11-14 NOTE — Patient Instructions (Signed)
[name] , Thank you for taking time to come for your Medicare Wellness Visit. I appreciate your ongoing commitment to your health goals. Please review the following plan we discussed and let me know if I can assist you in the future.   Screening recommendations/referrals: Colonoscopy: last done 11/07/2019; due every 7 years (11/07/2026) Recommended yearly ophthalmology/optometry visit for glaucoma screening and checkup Recommended yearly dental visit for hygiene and checkup  Vaccinations: Influenza vaccine: due Fall 2022 Pneumococcal vaccine: 12/14/2016, 12/15/2017 Tdap vaccine: 11/29/2012; due every 10 years (11/30/2022) Shingles vaccine: 01/08/2018   Covid-19: 05/24/2019, 06/13/2019, 01/23/2020, 09/30/2020  Advanced directives: Advance directive discussed with you today. Even though you declined this today please call our office should you change your mind and we can give you the proper paperwork for you to fill out.  Conditions/risks identified: No healthcare goals at this time.  Next appointment: Please schedule your next Medicare Wellness Visit with your Nurse Health Advisor in 1 year by calling [phone].  Preventive Care 34 Years and Older, Male Preventive care refers to lifestyle choices and visits with your health care provider that can promote health and wellness. What does preventive care include? A yearly physical exam. This is also called an annual well check. Dental exams once or twice a year. Routine eye exams. Ask your health care provider how often you should have your eyes checked. Personal lifestyle choices, including: Daily care of your teeth and gums. Regular physical activity. Eating a healthy diet. Avoiding tobacco and drug use. Limiting alcohol use. Practicing safe sex. Taking low doses of aspirin every day. Taking vitamin and mineral supplements as recommended by your health care provider. What happens during an annual well check? The services and screenings done  by your health care provider during your annual well check will depend on your age, overall health, lifestyle risk factors, and family history of disease. Counseling  Your health care provider may ask you questions about your: Alcohol use. Tobacco use. Drug use. Emotional well-being. Home and relationship well-being. Sexual activity. Eating habits. History of falls. Memory and ability to understand (cognition). Work and work Statistician. Screening  You may have the following tests or measurements: Height, weight, and BMI. Blood pressure. Lipid and cholesterol levels. These may be checked every 5 years, or more frequently if you are over 59 years old. Skin check. Lung cancer screening. You may have this screening every year starting at age 10 if you have a 30-pack-year history of smoking and currently smoke or have quit within the past 15 years. Fecal occult blood test (FOBT) of the stool. You may have this test every year starting at age 33. Flexible sigmoidoscopy or colonoscopy. You may have a sigmoidoscopy every 5 years or a colonoscopy every 10 years starting at age 53. Prostate cancer screening. Recommendations will vary depending on your family history and other risks. Hepatitis C blood test. Hepatitis B blood test. Sexually transmitted disease (STD) testing. Diabetes screening. This is done by checking your blood sugar (glucose) after you have not eaten for a while (fasting). You may have this done every 1-3 years. Abdominal aortic aneurysm (AAA) screening. You may need this if you are a current or former smoker. Osteoporosis. You may be screened starting at age 56 if you are at high risk. Talk with your health care provider about your test results, treatment options, and if necessary, the need for more tests. Vaccines  Your health care provider may recommend certain vaccines, such as: Influenza vaccine. This is recommended  every year. Tetanus, diphtheria, and acellular  pertussis (Tdap, Td) vaccine. You may need a Td booster every 10 years. Zoster vaccine. You may need this after age 20. Pneumococcal 13-valent conjugate (PCV13) vaccine. One dose is recommended after age 59. Pneumococcal polysaccharide (PPSV23) vaccine. One dose is recommended after age 80. Talk to your health care provider about which screenings and vaccines you need and how often you need them. This information is not intended to replace advice given to you by your health care provider. Make sure you discuss any questions you have with your health care provider. Document Released: 05/24/2015 Document Revised: 01/15/2016 Document Reviewed: 02/26/2015 Elsevier Interactive Patient Education  2017 [place] Prevention in the Home Falls can cause injuries. They can happen to people of all ages. There are many things you can do to make your home safe and to help prevent falls. What can I do on the outside of my home? Regularly fix the edges of walkways and driveways and fix any cracks. Remove anything that might make you trip as you walk through a door, such as a raised step or threshold. Trim any bushes or trees on the path to your home. Use bright outdoor lighting. Clear any walking paths of anything that might make someone trip, such as rocks or tools. Regularly check to see if handrails are loose or broken. Make sure that both sides of any steps have handrails. Any raised decks and porches should have guardrails on the edges. Have any leaves, snow, or ice cleared regularly. Use sand or salt on walking paths during winter. Clean up any spills in your garage right away. This includes oil or grease spills. What can I do in the bathroom? Use night lights. Install grab bars by the toilet and in the tub and shower. Do not use towel bars as grab bars. Use non-skid mats or decals in the tub or shower. If you need to sit down in the shower, use a plastic, non-slip stool. Keep the floor  dry. Clean up any water that spills on the floor as soon as it happens. Remove soap buildup in the tub or shower regularly. Attach bath mats securely with double-sided non-slip rug tape. Do not have throw rugs and other things on the floor that can make you trip. What can I do in the bedroom? Use night lights. Make sure that you have a light by your bed that is easy to reach. Do not use any sheets or blankets that are too big for your bed. They should not hang down onto the floor. Have a firm chair that has side arms. You can use this for support while you get dressed. Do not have throw rugs and other things on the floor that can make you trip. What can I do in the kitchen? Clean up any spills right away. Avoid walking on wet floors. Keep items that you use a lot in easy-to-reach places. If you need to reach something above you, use a strong step stool that has a grab bar. Keep electrical cords out of the way. Do not use floor polish or wax that makes floors slippery. If you must use wax, use non-skid floor wax. Do not have throw rugs and other things on the floor that can make you trip. What can I do with my stairs? Do not leave any items on the stairs. Make sure that there are handrails on both sides of the stairs and use them. Fix handrails that are broken  or loose. Make sure that handrails are as long as the stairways. Check any carpeting to make sure that it is firmly attached to the stairs. Fix any carpet that is loose or worn. Avoid having throw rugs at the top or bottom of the stairs. If you do have throw rugs, attach them to the floor with carpet tape. Make sure that you have a light switch at the top of the stairs and the bottom of the stairs. If you do not have them, ask someone to add them for you. What else can I do to help prevent falls? Wear shoes that: Do not have high heels. Have rubber bottoms. Are comfortable and fit you well. Are closed at the toe. Do not wear  sandals. If you use a stepladder: Make sure that it is fully opened. Do not climb a closed stepladder. Make sure that both sides of the stepladder are locked into place. Ask someone to hold it for you, if possible. Clearly mark and make sure that you can see: Any grab bars or handrails. First and last steps. Where the edge of each step is. Use tools that help you move around (mobility aids) if they are needed. These include: Canes. Walkers. Scooters. Crutches. Turn on the lights when you go into a dark area. Replace any light bulbs as soon as they burn out. Set up your furniture so you have a clear path. Avoid moving your furniture around. If any of your floors are uneven, fix them. If there are any pets around you, be aware of where they are. Review your medicines with your doctor. Some medicines can make you feel dizzy. This can increase your chance of falling. Ask your doctor what other things that you can do to help prevent falls. This information is not intended to replace advice given to you by your health care provider. Make sure you discuss any questions you have with your health care provider. Document Released: 02/21/2009 Document Revised: 10/03/2015 Document Reviewed: 06/01/2014 Elsevier Interactive Patient Education  2017 [employer].

## 2020-11-14 NOTE — Progress Notes (Signed)
Subjective:   [name] is a 70 y.o. male who presents for Medicare Annual/Subsequent preventive examination.  Review of Systems     Cardiac Risk Factors include: advanced age (>1men, >40 women);diabetes mellitus;dyslipidemia;family history of premature cardiovascular disease;hypertension;male gender     Objective:    Today's Vitals   11/14/20 0907  BP: 122/60  Pulse: 84  Temp: 97.6 F (36.4 C)  SpO2: 98%  Weight: 194 lb (88 kg)  Height: 5\' 11"  (1.803 m)  PainSc: 0-No pain   Body mass index is 27.06 kg/m.  Advanced Directives 11/14/2020 10/18/2019 05/23/2019 06/17/2017 10/04/2015 01/01/2015 09/27/2014  Does Patient Have a Medical Advance Directive? No No No No No Yes No  Does patient want to make changes to medical advance directive? - - - - - Yes - information given -  Would patient like information on creating a medical advance directive? No - Patient declined Yes (MAU/Ambulatory/Procedural Areas - Information given) No - Patient declined Yes (ED - Information included in AVS) No - patient declined information - No - patient declined information  Pre-existing out of facility DNR order (yellow form or pink MOST form) - - - - - - -    Current Medications (verified) Outpatient Encounter Medications as of 11/14/2020  Medication Sig   ACCU-CHEK GUIDE test strip USE TO CHECK SUGAR UP TO 4 TIMES DAILY E11.9   amLODipine (NORVASC) 5 MG tablet TAKE 1 TABLET BY MOUTH DAILY. FOLLOW-UP APPT DUE IN DEC MUST SEE PROVIDER FOR FUTURE REFILLS   aspirin EC 81 MG tablet Take 1 tablet (81 mg total) by mouth daily.   atorvastatin (LIPITOR) 20 MG tablet TAKE 1 TABLET BY MOUTH DAILY EXCEPT 1/2 TABLET ON TUESDAY, THURSDAY AND SATURDAY.   carvedilol (COREG) 12.5 MG tablet TAKE 1 AND 1/2 TABLETS BY MOUTH TWICE A DAY WITH MEALS (MAX ON INS)   digoxin (LANOXIN) 0.125 MG tablet TAKE 1 TABLET BY MOUTH EVERY DAY   famotidine (PEPCID) 20 MG tablet TAKE 1 TABLET BY MOUTH TWICE A DAY   JANUMET 50-1000 MG  tablet TAKE 1 TABLET BY MOUTH TWICE A DAY WITH A MEAL   Lancets (ONETOUCH ULTRASOFT) lancets Use as instructed to test sugar daily and prn. Dx E11.9   ramipril (ALTACE) 10 MG capsule TAKE 1 CAPSULE BY MOUTH 2 TIMES DAILY.   sotalol (BETAPACE) 120 MG tablet TAKE 1 TABLET BY MOUTH TWICE A DAY   vitamin B-12 (CYANOCOBALAMIN) 1000 MCG tablet Take 1 tablet (1,000 mcg total) by mouth daily.   COVID-19 mRNA Vac-TriS, Pfizer, (PFIZER-BIONT COVID-19 VAC-TRIS) SUSP injection Inject into the muscle.   omeprazole (PRILOSEC) 20 MG capsule TAKE 1 CAPSULE BY MOUTH DAILY. RECOMMENDED TO TAKE 30 MIN BEFORE BREAKFAST. *MAX PER INSURANCE   Prednisol Ace-Moxiflox-Bromfen 1-0.5-0.075 % SUSP Place 1 drop into the left eye 4 (four) times daily.   No facility-administered encounter medications on file as of 11/14/2020.    Allergies (verified) Zantac [ranitidine hcl]   History: Past Medical History:  Diagnosis Date   AICD (automatic cardioverter/defibrillator) present    Anxiety    since defibrillator placement   Arthritis    knees   Cataract    Chronic systolic heart failure (HCC)    Colon polyps    tubular adenoma   Diabetes mellitus    Dysrhythmia    GERD (gastroesophageal reflux disease)    Hx of myocardial infarction    Hyperlipidemia    Hypertension    NICM (nonischemic cardiomyopathy) ([place])    a.normal cors  by cath in 2005. b. s/p Medtronic ICD.   Pacemaker    Prostate cancer [facility]) 06/2003   s/p prostatectomy   S/P radiation therapy 10/10/2014 through 11/26/2014                                                      Prostate bed 6600 cGy in 33 sessions                           Sleep apnea    no CPAP use   Ventricular tachycardia ([place])    a. s/p ICD (generator change 09/2012). b. 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 and placed on sotalol   Past Surgical History:  Procedure Laterality Date   CARDIAC DEFIBRILLATOR PLACEMENT  03/11/2004   Medtronic Maximo single lead   COLONOSCOPY W/ BIOPSIES AND POLYPECTOMY   08/2004, 02/24/11   66mm adenoma in 2006,  5 mm polyp (not recovered) 2012   [place] N/A 10/08/2015   Procedure: CYSTOSCOPY WITH DIRECT VISION INTERNAL URETHROTOMY;  Surgeon: Nickie Retort, MD;  Location: [facility];  Service: Urology;  Laterality: N/A;   IMPLANTABLE CARDIOVERTER DEFIBRILLATOR GENERATOR CHANGE N/A 10/07/2012   Procedure: IMPLANTABLE CARDIOVERTER DEFIBRILLATOR GENERATOR CHANGE;  Surgeon: Evans Lance, MD;  Location: [facility] C[facility] LAB;  Service: Cardiovascular;  Laterality: N/A;   PROSTATECTOMY  06/2003   Dr Janice Norrie   RECTAL SURGERY     UPPER GASTROINTESTINAL ENDOSCOPY  08/2004, 02/24/11   Barrett's esophagus   Family History  Problem Relation Age of Onset   Coronary artery disease Father    Prostate cancer Father    Diabetes Father    Hypertension Mother    Diabetes Paternal Grandmother    Stroke Neg Hx    Colon cancer Neg Hx    Colon polyps Neg Hx    Esophageal cancer Neg Hx    Rectal cancer Neg Hx    Stomach cancer Neg Hx    Social History   Socioeconomic History   Marital status: Married    Spouse name: Not on file   Number of children: 2   Years of education: Not on file   Highest education level: Not on file  Occupational History   Occupation: Therapist, sports: [employer]  Tobacco Use   Smoking status: Never   Smokeless tobacco: Never  Vaping Use   Vaping Use: Never used  Substance and Sexual Activity   Alcohol use: No   Drug use: No   Sexual activity: Not on file  Other Topics Concern   Not on file  Social History Narrative   Not on file   Social Determinants of Health   Financial Resource Strain: Low Risk    Difficulty of Paying Living Expenses: Not hard at all  Food Insecurity: No Food Insecurity   Worried About Charity fundraiser in the Last Year: Never true   [place] in the Last Year: Never true  Transportation Needs: No Transportation Needs   Lack of Transportation (Medical): No    Lack of Transportation (Non-Medical): No  Physical Activity: Sufficiently Active   Days of Exercise per Week: 5 days   Minutes of Exercise per Session: 30 min  Stress: No Stress Concern Present   Feeling of Stress :  Not at all  Social Connections: Socially Integrated   Frequency of Communication with Friends and Family: More than three times a week   Frequency of Social Gatherings with Friends and Family: More than three times a week   Attends Religious Services: More than 4 times per year   Active Member of [employer] or Organizations: Yes   Attends Music therapist: More than 4 times per year   Marital Status: Married    Tobacco Counseling Counseling given: Not Answered   Clinical Intake:  Pre-visit preparation completed: Yes  Pain : No/denies pain Pain Score: 0-No pain     BMI - recorded: 27.06 Nutritional Status: BMI 25 -29 Overweight Nutritional Risks: Unintentional weight loss Diabetes: Yes CBG done?: Yes CBG resulted in Enter/ Edit results?: Yes (112) Did pt. bring in CBG monitor from home?: No  How often do you need to have someone help you when you read instructions, pamphlets, or other written materials from your doctor or pharmacy?: 1 - Never What is the last grade level you completed in school?: Bachelor's Degree  Diabetic? no  Interpreter Needed?: No  Information entered by :: Lisette Abu, LPN   Activities of Daily Living In your present state of health, do you have any difficulty performing the following activities: 11/14/2020  Hearing? N  Vision? N  Difficulty concentrating or making decisions? Y  Comment family history of dementia  Walking or climbing stairs? Y  Comment short of breath/exhausted  Dressing or bathing? N  Doing errands, shopping? N  Preparing Food and eating ? N  Using the Toilet? N  In the past six months, have you accidently leaked urine? N  Do you have problems with loss of bowel control? N  Managing your  Medications? N  Managing your Finances? N  Housekeeping or managing your Housekeeping? N  Some recent data might be hidden    Patient Care Team: Binnie Rail, MD as PCP - General (Internal Medicine) Evans Lance, MD as PCP - Cardiology (Cardiology) Ardis Hughs, MD as Attending Physician (Urology) Gatha Mayer, MD as Consulting Physician (Gastroenterology)  Indicate any recent Medical Services you may have received from other than [facility] providers in the past year (date may be approximate).     Assessment:   This is a routine wellness examination for [name].  Hearing/Vision screen Hearing Screening - Comments:: Patient denied any hearing difficulty. Vision Screening - Comments:: Patient wears glasses.  Eye exam done once a year by Dr. Midge Aver.  Dietary issues and exercise activities discussed: Current Exercise Habits: The patient has a physically strenuous job, but has no regular exercise apart from work., Exercise limited by: cardiac condition(s)   Goals Addressed   None   Depression Screen PHQ 2/9 Scores 11/14/2020 10/18/2019 08/24/2019 06/17/2017 06/17/2017 06/16/2016 01/01/2015  PHQ - 2 Score 0 0 0 1 0 0 0  PHQ- 9 Score - - - 3 - - -    Fall Risk Fall Risk  11/14/2020 10/18/2019 08/24/2019 06/17/2017 06/17/2017  Falls in the past year? 0 0 0 No No  Number falls in past yr: 0 0 0 - -  Injury with Fall? 0 0 0 - -  Risk for fall due to : No Fall Risks No Fall Risks - - -  Follow up Falls evaluation completed Falls evaluation completed - - -    FALL RISK PREVENTION PERTAINING TO THE HOME:  Any stairs in or around the home? Yes  If so, are  there any without handrails? No  Home free of loose throw rugs in walkways, pet beds, electrical cords, etc? Yes  Adequate lighting in your home to reduce risk of falls? Yes   ASSISTIVE DEVICES UTILIZED TO PREVENT FALLS:  Life alert? No  Use of a cane, walker or w/c? No  Grab bars in the bathroom? No  Shower chair or bench in  shower? No  Elevated toilet seat or a handicapped toilet? Yes   TIMED UP AND GO:  Was the test performed? Yes .  Length of time to ambulate 10 feet: 11 sec.   Gait slow and steady without use of assistive device  Cognitive Function: No flowsheet data found. Normal cognitive status assessed by direct observation by this Nurse Health Advisor. No abnormalities found.  Patient having some memory issues. Referral to neurology placed.       6CIT Screen 10/18/2019  What Year? 0 points  What month? 0 points  What time? 0 points  Count back from 20 0 points  Months in reverse 0 points  Repeat phrase 0 points  Total Score 0    Immunizations Immunization History  Administered Date(s) Administered   Fluad Quad(high Dose 65+) 02/06/2019   Influenza Split 03/10/2012   Influenza, High Dose Seasonal PF 06/16/2016, 06/17/2017   Influenza, Seasonal, Injecte, Preservative Fre 03/10/2014   Influenza,inj,Quad PF,6+ Mos 03/02/2013   Influenza,trivalent, recombinat, inj, PF 01/09/2018   PFIZER Comirnaty(Gray Top)Covid-19 Tri-Sucrose Vaccine 09/30/2020   PFIZER(Purple Top)SARS-COV-2 Vaccination 05/24/2019, 06/13/2019, 01/23/2020   Pneumococcal Conjugate-13 12/14/2016   Pneumococcal Polysaccharide-23 12/15/2017   Tdap 11/29/2012   Zoster Recombinat (Shingrix) 01/08/2018    TDAP status: Up to date  Flu Vaccine status: Due, Education has been provided regarding the importance of this vaccine. Advised may receive this vaccine at local pharmacy or Health Dept. Aware to provide a copy of the vaccination record if obtained from local pharmacy or Health Dept. Verbalized acceptance and understanding.  Pneumococcal vaccine status: Up to date  Covid-19 vaccine status: Completed vaccines  Qualifies for Shingles Vaccine? Yes   Zostavax completed No   Shingrix Completed?: No.    Education has been provided regarding the importance of this vaccine. Patient has been advised to call insurance company to  determine out of pocket expense if they have not yet received this vaccine. Advised may also receive vaccine at local pharmacy or Health Dept. Verbalized acceptance and understanding.  Screening Tests Health Maintenance  Topic Date Due   FOOT EXAM  10/16/2020   Zoster Vaccines- Shingrix (2 of 2) 02/14/2021 (Originally 03/05/2018)   INFLUENZA VACCINE  12/09/2020   COVID-19 Vaccine (5 - Booster for Pfizer series) 01/31/2021   HEMOGLOBIN A1C  02/08/2021   OPHTHALMOLOGY EXAM  04/09/2021   TETANUS/TDAP  11/30/2022   COLONOSCOPY (Pts 45-57yrs Insurance coverage will need to be confirmed)  11/07/2026   Hepatitis C Screening  Completed   PNA vac Low Risk Adult  Completed   HPV VACCINES  Aged Out    Health Maintenance  Health Maintenance Due  Topic Date Due   FOOT EXAM  10/16/2020    Colorectal cancer screening: Type of screening: Colonoscopy. Completed 11/07/2019. Repeat every 7 years  Lung Cancer Screening: (Low Dose CT Chest recommended if Age 61-80 years, 30 pack-year currently smoking OR have quit w/in 15years.) does not qualify.   Lung Cancer Screening Referral: no  Additional Screening:  Hepatitis C Screening: does qualify; Completed yes  Vision Screening: Recommended annual ophthalmology exams for early detection of glaucoma  and other disorders of the eye. Is the patient up to date with their annual eye exam?  Yes  Who is the provider or what is the name of the office in which the patient attends annual eye exams? Warden Fillers, MD. If pt is not established with a provider, would they like to be referred to a provider to establish care? No .   Dental Screening: Recommended annual dental exams for proper oral hygiene  Community Resource Referral / Chronic Care Management: CRR required this visit?  No   CCM required this visit?  No      Plan:     I have personally reviewed and noted the following in the patient's chart:   Medical and social history Use of  alcohol, tobacco or illicit drugs  Current medications and supplements including opioid prescriptions. Patient is not currently taking opioid prescriptions. Functional ability and status Nutritional status Physical activity Advanced directives List of other physicians Hospitalizations, surgeries, and ER visits in previous 12 months Vitals Screenings to include cognitive, depression, and falls Referrals and appointments  In addition, I have reviewed and discussed with patient certain preventive protocols, quality metrics, and best practice recommendations. A written personalized care plan for preventive services as well as general preventive health recommendations were provided to patient.     Sheral Flow, LPN   11/17/238   Nurse Notes: n/a

## 2020-11-15 ENCOUNTER — Ambulatory Visit: Payer: Medicare PPO | Admitting: Internal Medicine

## 2020-11-15 ENCOUNTER — Encounter: Payer: Self-pay | Admitting: Internal Medicine

## 2020-11-15 ENCOUNTER — Telehealth: Payer: Self-pay | Admitting: Internal Medicine

## 2020-11-15 DIAGNOSIS — R634 Abnormal weight loss: Secondary | ICD-10-CM | POA: Insufficient documentation

## 2020-11-15 DIAGNOSIS — R413 Other amnesia: Secondary | ICD-10-CM | POA: Insufficient documentation

## 2020-11-15 NOTE — Assessment & Plan Note (Signed)
Rapid and seems due to less calorie intake by not eating dinner most days for several months anymore though pt denies low appetite; now for CT chest/abd/pelvis to r/o underlying malignancy

## 2020-11-15 NOTE — Assessment & Plan Note (Signed)
Lab Results  Component Value Date   VITAMINB12 938 (H) 11/14/2020   Stable, cont oral replacement - b12 1000 mcg qd

## 2020-11-15 NOTE — Assessment & Plan Note (Signed)
With at least mild worsening recently, etiology unclear, for MRI  And neurology referral

## 2020-11-15 NOTE — Assessment & Plan Note (Signed)
Exam benign, continue current med tx sotalol

## 2020-11-15 NOTE — Assessment & Plan Note (Signed)
Also for psa with labs, f/u urology as planned

## 2020-11-15 NOTE — Assessment & Plan Note (Addendum)
BP Readings from Last 3 Encounters:  11/14/20 122/60  11/14/20 122/60  08/09/20 128/70   Stable, pt to continue medical treatment coreg, altace, norvasc

## 2020-11-15 NOTE — Chronic Care Management (AMB) (Signed)
  Chronic Care Management   Note  11/15/2020 Name: BIRL LOBELLO MRN: [id number] DOB: 03/29/1951  MYLEN MANGAN is a 70 y.o. year old male who is a primary care patient of Quay Burow, Claudina Lick, MD. I reached out to Alita Chyle by phone today in response to a referral sent by Mr. Macarthur Lorusso Shvartsman's PCP, Binnie Rail, MD.   Mr. Kimmel was given information about Chronic Care Management services today including:  CCM service includes personalized support from designated clinical staff supervised by his physician, including individualized plan of care and coordination with other care providers 24/7 contact phone numbers for assistance for urgent and routine care needs. Service will only be billed when office clinical staff spend 20 minutes or more in a month to coordinate care. Only one practitioner may furnish and bill the service in a calendar month. The patient may stop CCM services at any time (effective at the end of the month) by phone call to the office staff.   Patient agreed to services and verbal consent obtained.   Follow up plan:   Lauretta Grill Upstream Scheduler

## 2020-11-15 NOTE — Assessment & Plan Note (Signed)
Lab Results  Component Value Date   HGBA1C 6.4 11/14/2020   Currently Stable, pt to continue current medical treatment janumet but may need decreased med for further wt loss

## 2020-11-28 ENCOUNTER — Encounter: Payer: Self-pay | Admitting: Physician Assistant

## 2020-11-29 ENCOUNTER — Other Ambulatory Visit: Payer: Self-pay | Admitting: Internal Medicine

## 2020-12-01 NOTE — Progress Notes (Signed)
Subjective:    Patient ID: [name], male    DOB: 20-Sep-1950, 70 y.o.   MRN: [id number]  HPI The patient is here for follow up of their chronic medical problems, including weight loss, DM, HFrEF, htn, Hichol, GERD, RLS  He was seen two weeks ago for weight loss.  He has a Ct Ab/pelvis scheduled for 7/27.    Weight stable from 7/7.  Lost 30 lbs 4/1 to 7/7.    Some fatigue - tires easily.  Walking to his car at the end of the day can be difficult.    Medications and allergies reviewed with patient and updated if appropriate.  Patient Active Problem List   Diagnosis Date Noted   Memory loss 11/15/2020   Weight loss 11/15/2020   Memory difficulties 08/09/2020   Chronic back pain 04/25/2019   Neuralgia 10/19/2018   Acute cystitis without hematuria 09/05/2018   Chronic left-sided thoracic back pain 08/23/2018   Right foot pain 12/15/2017   Hip pain 12/14/2016   Left knee pain 12/14/2016   Shingles 07/28/2016   GERD (gastroesophageal reflux disease) 07/29/2015   Malignant neoplasm of prostate ([place]) 09/27/2014   RLS (restless legs syndrome) 11/29/2012   Obstructive sleep apnea 11/10/2012   Elevated alkaline phosphatase level 10/04/2012   Vitamin B 12 deficiency 0000000   Chronic systolic heart failure ([place]) 01/27/2011   Mild anemia 01/20/2011   Ventricular tachycardia (H[facility])    NICM (nonischemic cardiomyopathy) ([place])    Diabetes ([place]) 08/26/2009   Hyperlipidemia 08/26/2009   Essential hypertension 08/26/2009   PROSTATE CANCER, HX OF 08/26/2009   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   Barrett's esophagus 09/05/2004    Current Outpatient Medications on File Prior to Visit  Medication Sig Dispense Refill   ACCU-CHEK GUIDE test strip USE TO CHECK SUGAR UP TO 4 TIMES DAILY E11.9 100 strip 3   amLODipine (NORVASC) 5 MG tablet TAKE 1 TABLET BY MOUTH DAILY. FOLLOW-UP APPT DUE IN DEC MUST SEE PROVIDER FOR FUTURE REFILLS 30 tablet 2    aspirin EC 81 MG tablet Take 1 tablet (81 mg total) by mouth daily. 30 tablet 2   atorvastatin (LIPITOR) 20 MG tablet TAKE 1 TABLET BY MOUTH DAILY EXCEPT 1/2 TABLET ON TUESDAY, THURSDAY AND SATURDAY. 22 tablet 8   carvedilol (COREG) 12.5 MG tablet TAKE 1 AND 1/2 TABLETS BY MOUTH TWICE A DAY WITH MEALS (MAX ON INS) 90 tablet 5   digoxin (LANOXIN) 0.125 MG tablet TAKE 1 TABLET BY MOUTH EVERY DAY 30 tablet 2   famotidine (PEPCID) 20 MG tablet TAKE 1 TABLET BY MOUTH TWICE A DAY 180 tablet 1   JANUMET 50-1000 MG tablet TAKE 1 TABLET BY MOUTH TWICE A DAY WITH A MEAL 60 tablet 8   Lancets (ONETOUCH ULTRASOFT) lancets Use as instructed to test sugar daily and prn. Dx E11.9 100 each 12   omeprazole (PRILOSEC) 20 MG capsule TAKE 1 CAPSULE BY MOUTH DAILY. RECOMMENDED TO TAKE 30 MIN BEFORE BREAKFAST. *MAX PER INSURANCE 90 capsule 1   Prednisol Ace-Moxiflox-Bromfen 1-0.5-0.075 % SUSP Place 1 drop into the left eye 4 (four) times daily.     ramipril (ALTACE) 10 MG capsule TAKE 1 CAPSULE BY MOUTH 2 TIMES DAILY. 60 capsule 5   sotalol (BETAPACE) 120 MG tablet TAKE 1 TABLET BY MOUTH TWICE A DAY 180 tablet 1   vitamin B-12 (CYANOCOBALAMIN) 1000 MCG tablet Take 1 tablet (1,000 mcg total) by mouth daily. 90 tablet 1   No current facility-administered  medications on file prior to visit.    Past Medical History:  Diagnosis Date   AICD (automatic cardioverter/defibrillator) present    Anxiety    since defibrillator placement   Arthritis    knees   Cataract    Chronic systolic heart failure ([place])    Colon polyps    tubular adenoma   Diabetes mellitus    Dysrhythmia    GERD (gastroesophageal reflux disease)    Hx of myocardial infarction    Hyperlipidemia    Hypertension    NICM (nonischemic cardiomyopathy) ([place])    a.normal cors by cath in 2005. b. s/p Medtronic ICD.   Pacemaker    Prostate cancer [facility]) 06/2003   s/p prostatectomy   S/P radiation therapy 10/10/2014 through 11/26/2014                                                       Prostate bed 6600 cGy in 33 sessions                           Sleep apnea    no CPAP use   Ventricular tachycardia ([place])    a. s/p ICD (generator change 09/2012). b. 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 and placed on sotalol    Past Surgical History:  Procedure Laterality Date   CARDIAC DEFIBRILLATOR PLACEMENT  03/11/2004   Medtronic Maximo single lead   COLONOSCOPY W/ BIOPSIES AND POLYPECTOMY  08/2004, 02/24/11   31m adenoma in 2006,  5 mm polyp (not recovered) 2012   [place] 10[place]   Procedure: CYSTOSCOPY WITH DIRECT VISION INTERNAL URETHROTOMY;  Surgeon: BNickie Retort MD;  Location: [facility];  Service: Urology;  Laterality: N/A;   IMPLANTABLE CARDIOVERTER DEFIBRILLATOR GENERATOR CHANGE N/A 10/07/2012   Procedure: IMPLANTABLE CARDIOVERTER DEFIBRILLATOR GENERATOR CHANGE;  Surgeon: GEvans Lance MD;  [facility];  Service: Cardiovascular;  Laterality: N/A;   PROSTATECTOMY  06/2003   Dr NJanice Norrie  RECTAL SURGERY     UPPER GASTROINTESTINAL ENDOSCOPY  08/2004, 02/24/11   Barrett's esophagus    Social History   Socioeconomic History   Marital status: Married    Spouse name: Not on file   Number of children: 2   Years of education: Not on file   Highest education level: Not on file  Occupational History   Occupation: BTherapist, sports [employer]  Tobacco Use   Smoking status: Never   Smokeless tobacco: Never  Vaping Use   Vaping Use: Never used  Substance and Sexual Activity   Alcohol use: No   Drug use: No   Sexual activity: Not on file  Other Topics Concern   Not on file  Social History Narrative   Not on file   Social Determinants of Health   Financial Resource Strain: Low Risk    Difficulty of Paying Living Expenses: Not hard at all  Food Insecurity: No Food Insecurity   Worried About RCharity fundraiserin the Last Year: Never true   RTenaflyin the Last Year: Never true   Transportation Needs: No Transportation Needs   Lack of Transportation (Medical): No   Lack of Transportation (Non-Medical): No  Physical Activity: Sufficiently Active   Days of Exercise per Week:  5 days   Minutes of Exercise per Session: 30 min  Stress: No Stress Concern Present   Feeling of Stress : Not at all  Social Connections: Socially Integrated   Frequency of Communication with Friends and Family: More than three times a week   Frequency of Social Gatherings with Friends and Family: More than three times a week   Attends Religious Services: More than 4 times per year   Active Member of [employer] or Organizations: Yes   Attends Music therapist: More than 4 times per year   Marital Status: Married    Family History  Problem Relation Age of Onset   Coronary artery disease Father    Prostate cancer Father    Diabetes Father    Hypertension Mother    Diabetes Paternal Grandmother    Stroke Neg Hx    Colon cancer Neg Hx    Colon polyps Neg Hx    Esophageal cancer Neg Hx    Rectal cancer Neg Hx    Stomach cancer Neg Hx     Review of Systems  Constitutional:  Positive for fatigue. Negative for appetite change and fever.  Respiratory:  Negative for cough, shortness of breath and wheezing.   Cardiovascular:  Negative for chest pain, palpitations and leg swelling.  Gastrointestinal:  Positive for diarrhea (frequent). Negative for abdominal pain and blood in stool (no black stool).  Musculoskeletal:  Positive for back pain.  Neurological:  Positive for light-headedness. Negative for headaches.      Objective:   Vitals:   12/02/20 1311  BP: 132/80  Pulse: 79  Temp: 98.2 F (36.8 C)  SpO2: 97%   BP Readings from Last 3 Encounters:  12/02/20 132/80  11/14/20 122/60  11/14/20 122/60   Wt Readings from Last 3 Encounters:  12/02/20 194 lb (88 kg)  11/14/20 194 lb (88 kg)  11/14/20 194 lb (88 kg)   Body mass index is 27.06 kg/m.   Physical Exam     Constitutional: Appears well-developed and well-nourished. No distress.  HENT:  Head: Normocephalic and atraumatic.  Neck: Neck supple. No tracheal deviation present. No thyromegaly present.  No cervical lymphadenopathy Cardiovascular: Normal rate, regular rhythm and normal heart sounds.   No murmur heard. No carotid bruit .  No edema Pulmonary/Chest: Effort normal and breath sounds normal. No respiratory distress. No has no wheezes. No rales.  Skin: Skin is warm and dry. Not diaphoretic.  Psychiatric: Normal mood and affect. Behavior is normal.      Assessment & Plan:    See Problem List for Assessment and Plan of chronic medical problems.    This visit occurred during the SARS-CoV-2 public health emergency.  Safety protocols were in place, including screening questions prior to the visit, additional usage of staff PPE, and extensive cleaning of exam room while observing appropriate contact time as indicated for disinfecting solutions.

## 2020-12-02 ENCOUNTER — Other Ambulatory Visit: Payer: Self-pay

## 2020-12-02 ENCOUNTER — Ambulatory Visit: Payer: Medicare PPO | Admitting: Internal Medicine

## 2020-12-02 ENCOUNTER — Ambulatory Visit (INDEPENDENT_AMBULATORY_CARE_PROVIDER_SITE_OTHER): Payer: Medicare PPO

## 2020-12-02 ENCOUNTER — Encounter: Payer: Self-pay | Admitting: Internal Medicine

## 2020-12-02 VITALS — BP 132/80 | HR 79 | Temp 98.2°F | Ht 71.0 in | Wt 194.0 lb

## 2020-12-02 DIAGNOSIS — I10 Essential (primary) hypertension: Secondary | ICD-10-CM

## 2020-12-02 DIAGNOSIS — M545 Low back pain, unspecified: Secondary | ICD-10-CM | POA: Diagnosis not present

## 2020-12-02 DIAGNOSIS — R5383 Other fatigue: Secondary | ICD-10-CM | POA: Diagnosis not present

## 2020-12-02 DIAGNOSIS — R634 Abnormal weight loss: Secondary | ICD-10-CM

## 2020-12-02 DIAGNOSIS — E1151 Type 2 diabetes mellitus with diabetic peripheral angiopathy without gangrene: Secondary | ICD-10-CM

## 2020-12-02 DIAGNOSIS — I5022 Chronic systolic (congestive) heart failure: Secondary | ICD-10-CM

## 2020-12-02 DIAGNOSIS — K219 Gastro-esophageal reflux disease without esophagitis: Secondary | ICD-10-CM | POA: Diagnosis not present

## 2020-12-02 DIAGNOSIS — G2581 Restless legs syndrome: Secondary | ICD-10-CM

## 2020-12-02 DIAGNOSIS — E7849 Other hyperlipidemia: Secondary | ICD-10-CM

## 2020-12-02 HISTORY — DX: Other fatigue: R53.83

## 2020-12-02 MED ORDER — JANUMET 50-1000 MG PO TABS: 1.0000 | ORAL_TABLET | Freq: Every day | ORAL | 8 refills | Status: DC

## 2020-12-02 NOTE — Assessment & Plan Note (Signed)
Acute Will get xray Further treatment based on xray

## 2020-12-02 NOTE — Assessment & Plan Note (Signed)
New ? Cardiac - sees cardio tomorrow Blood work good except slight worsening of chronic anemia Will await cardio visit and Ct scan this week to help determine further evaluation Will repeat cbc at next visit

## 2020-12-02 NOTE — Assessment & Plan Note (Signed)
Chronic GERD controlled Continue omeprazole 20 mg qd  

## 2020-12-02 NOTE — Assessment & Plan Note (Signed)
Chronic BP well controlled, but does experience some lightheadedness at times Continue norvasc 5 mg qd, coreg 18.75 mg bid, altace 10 mg bid

## 2020-12-02 NOTE — Progress Notes (Signed)
Electrophysiology Office Note Date: 12/03/2020  ID:  [name], DOB [birth year], MRN [id number]  PCP: Binnie Rail, MD Primary Cardiologist: Cristopher Peru, MD Electrophysiologist: Cristopher Peru, MD   CC: Routine ICD follow-up  [name] is a 70 y.o. male seen today for Cristopher Peru, MD for routine electrophysiology followup.  Since last being seen in our clinic the patient reports doing very well.  he denies chest pain, palpitations, dyspnea, PND, orthopnea, nausea, vomiting, dizziness, syncope, edema, weight gain, or early satiety. He has not had ICD shocks.   Device History: MDT single chamber ICD, current system implanted 10/07/2012 Initial device implant was ?2005, 6949 lead was capped/abandoned AAD 2012, VT storm, numerous shocks, started on sotalol  Past Medical History:  Diagnosis Date   AICD (automatic cardioverter/defibrillator) present    Anxiety    since defibrillator placement   Arthritis    knees   Cataract    Chronic systolic heart failure ([place])    Colon polyps    tubular adenoma   Diabetes mellitus    Dysrhythmia    GERD (gastroesophageal reflux disease)    Hx of myocardial infarction    Hyperlipidemia    Hypertension    NICM (nonischemic cardiomyopathy) ([place])    a.normal cors by cath in 2005. b. s/p Medtronic ICD.   Pacemaker    Prostate cancer [facility]) 06/2003   s/p prostatectomy   S/P radiation therapy 10/10/2014 through 11/26/2014                                                      Prostate bed 6600 cGy in 33 sessions                           Sleep apnea    no CPAP use   Ventricular tachycardia ([place])    a. s/p ICD (generator change 09/2012). b. 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 and placed on sotalol   Past Surgical History:  Procedure Laterality Date   CARDIAC DEFIBRILLATOR PLACEMENT  03/11/2004   Medtronic Maximo single lead   COLONOSCOPY W/ BIOPSIES AND POLYPECTOMY  08/2004, 02/24/11   34m adenoma in 2006,  5 mm polyp (not recovered) 2012   [place] 10[place]   Procedure: CYSTOSCOPY WITH DIRECT VISION INTERNAL URETHROTOMY;  Surgeon: BNickie Retort MD;  [facility];  Service: Urology;  Laterality: N/A;   IMPLANTABLE CARDIOVERTER DEFIBRILLATOR GENERATOR CHANGE N/A 10/07/2012   Procedure: IMPLANTABLE CARDIOVERTER DEFIBRILLATOR GENERATOR CHANGE;  Surgeon: GEvans Lance MD;  [facility];  Service: Cardiovascular;  Laterality: N/A;   PROSTATECTOMY  06/2003   Dr NJanice Norrie  RECTAL SURGERY     UPPER GASTROINTESTINAL ENDOSCOPY  08/2004, 02/24/11   Barrett's esophagus    Current Outpatient Medications  Medication Sig Dispense Refill   ACCU-CHEK GUIDE test strip USE TO CHECK SUGAR UP TO 4 TIMES DAILY E11.9 100 strip 3   amLODipine (NORVASC) 5 MG tablet TAKE 1 TABLET BY MOUTH DAILY. FOLLOW-UP APPT DUE IN DEC MUST SEE PROVIDER FOR FUTURE REFILLS 30 tablet 2   aspirin EC 81 MG tablet Take 1 tablet (81 mg total) by mouth daily. 30 tablet 2   atorvastatin (LIPITOR) 20 MG tablet TAKE 1 TABLET BY MOUTH DAILY EXCEPT 1/2 TABLET  ON TUESDAY, THURSDAY AND SATURDAY. 22 tablet 8   digoxin (LANOXIN) 0.125 MG tablet TAKE 1 TABLET BY MOUTH EVERY DAY 30 tablet 2   famotidine (PEPCID) 20 MG tablet TAKE 1 TABLET BY MOUTH TWICE A DAY 180 tablet 1   Lancets (ONETOUCH ULTRASOFT) lancets Use as instructed to test sugar daily and prn. Dx E11.9 100 each 12   omeprazole (PRILOSEC) 20 MG capsule TAKE 1 CAPSULE BY MOUTH DAILY. RECOMMENDED TO TAKE 30 MIN BEFORE BREAKFAST. *MAX PER INSURANCE 90 capsule 1   Prednisol Ace-Moxiflox-Bromfen 1-0.5-0.075 % SUSP Place 1 drop into the left eye 4 (four) times daily.     ramipril (ALTACE) 10 MG capsule TAKE 1 CAPSULE BY MOUTH 2 TIMES DAILY. 60 capsule 5   sitaGLIPtin-metformin (JANUMET) 50-1000 MG tablet Take 1 tablet by mouth daily. Take with a meal 60 tablet 8   sotalol (BETAPACE) 120 MG tablet TAKE 1 TABLET BY MOUTH TWICE A DAY 180 tablet 1   vitamin B-12 (CYANOCOBALAMIN) 1000  MCG tablet Take 1 tablet (1,000 mcg total) by mouth daily. 90 tablet 1   carvedilol (COREG) 25 MG tablet Take 1 tablet (25 mg total) by mouth 2 (two) times daily with a meal. 180 tablet 3   No current facility-administered medications for this visit.    Allergies:   Zantac [ranitidine hcl]   Social History: Social History   Socioeconomic History   Marital status: Married    Spouse name: Not on file   Number of children: 2   Years of education: Not on file   Highest education level: Not on file  Occupational History   Occupation: Therapist, sports: [employer]  Tobacco Use   Smoking status: Never   Smokeless tobacco: Never  Vaping Use   Vaping Use: Never used  Substance and Sexual Activity   Alcohol use: No   Drug use: No   Sexual activity: Not on file  Other Topics Concern   Not on file  Social History Narrative   Not on file   Social Determinants of Health   Financial Resource Strain: Low Risk    Difficulty of Paying Living Expenses: Not hard at all  Food Insecurity: No Food Insecurity   Worried About Charity fundraiser in the Last Year: Never true   [place] in the Last Year: Never true  Transportation Needs: No Transportation Needs   Lack of Transportation (Medical): No   Lack of Transportation (Non-Medical): No  Physical Activity: Sufficiently Active   Days of Exercise per Week: 5 days   Minutes of Exercise per Session: 30 min  Stress: No Stress Concern Present   Feeling of Stress : Not at all  Social Connections: Socially Integrated   Frequency of Communication with Friends and Family: More than three times a week   Frequency of Social Gatherings with Friends and Family: More than three times a week   Attends Religious Services: More than 4 times per year   Active Member of [employer] or Organizations: Yes   Attends Music therapist: More than 4 times per year   Marital Status: Married  Human resources officer Violence: Not At Risk   Fear  of Current or Ex-Partner: No   Emotionally Abused: No   Physically Abused: No   Sexually Abused: No    Family History: Family History  Problem Relation Age of Onset   Coronary artery disease Father    Prostate cancer Father    Diabetes Father  Hypertension Mother    Diabetes Paternal Grandmother    Stroke Neg Hx    Colon cancer Neg Hx    Colon polyps Neg Hx    Esophageal cancer Neg Hx    Rectal cancer Neg Hx    Stomach cancer Neg Hx     Review of Systems: All other systems reviewed and are otherwise negative except as noted above.   Physical Exam: Vitals:   12/03/20 0952  BP: (!) 146/84  Pulse: 81  SpO2: 99%  Weight: 194 lb (88 kg)  Height: '5\' 11"'$  (1.803 m)     GEN- The patient is well appearing, alert and oriented x 3 today.   HEENT: normocephalic, atraumatic; sclera clear, conjunctiva pink; hearing intact; oropharynx clear; neck supple, no JVP Lymph- no cervical lymphadenopathy Lungs- Clear to ausculation bilaterally, normal work of breathing.  No wheezes, rales, rhonchi Heart- Regular rate and rhythm, no murmurs, rubs or gallops, PMI not laterally displaced GI- soft, non-tender, non-distended, bowel sounds present, no hepatosplenomegaly Extremities- no clubbing or cyanosis. No edema; DP/PT/radial pulses 2+ bilaterally MS- no significant deformity or atrophy Skin- warm and dry, no rash or lesion; ICD pocket well healed Psych- euthymic mood, full affect Neuro- strength and sensation are intact  ICD interrogation- reviewed in detail today,  See PACEART report  EKG:  EKG is ordered today. The ekg ordered today shows NSR at 81 bpm with stable QTc on sotalol  Recent Labs: 11/14/2020: ALT 9; BUN 14; Creatinine, Ser 0.89; Hemoglobin 11.0; Platelets 230.0; Potassium 4.2; Pro B Natriuretic peptide (BNP) 19.0; Sodium 143; TSH 1.89   Wt Readings from Last 3 Encounters:  12/03/20 194 lb (88 kg)  12/02/20 194 lb (88 kg)  11/14/20 194 lb (88 kg)     Other studies  Reviewed: Additional studies/ records that were reviewed today include: Previous EP office notes   Assessment and Plan:  1.  Chronic systolic dysfunction s/p Medtronic single chamber ICD  euvolemic today Stable on an appropriate medical regimen Normal ICD function See Pace Art report On BB/ACE, digoxin No changes today  2. H/o VT storm On sotalol. EKG today shows NSR with stable QTc Increase coreg to 25 mg BID.  Recent labs 11/14/20 stable. Cr 0.89, K 4.2.  3. HTN Stable on current regimen  Current medicines are reviewed at length with the patient today.   The patient does not have concerns regarding his medicines.  The following changes were made today:  Increase coreg  Labs/ tests ordered today include:  Orders Placed This Encounter  Procedures   EKG 12-Lead    Disposition:   Follow up with Dr. Lovena Le  or EP APP 6 months   Signed, Shirley Friar, PA-C  12/03/2020 10:07 AM  [place] [address] [place] [address] [phone] (office) [phone] (fax)

## 2020-12-02 NOTE — Assessment & Plan Note (Addendum)
New Lost 30 lbs since April - he denies change in eating or exercise but there are some memory difficulties so he may not be the best historian ? Related to increase in janumet to BID in April - will decrease back to daily CT C/A/P scheduled for this week Blood work reassuring Further evaluation TBD after Ct scan

## 2020-12-02 NOTE — Assessment & Plan Note (Signed)
Chronic Sugars well controlled In April I increased his janumet to bid  - ? Cause of weight loss - seems unlikely but given good sugar control and weight loss will decrease janumet 50-100 mg to once daily

## 2020-12-02 NOTE — Patient Instructions (Addendum)
  Have an xray downstairs today.   Medications changes include :  decrease janumet to once daily only - take with a meal.       Please followup in 3-4 weeks

## 2020-12-03 ENCOUNTER — Encounter: Payer: Self-pay | Admitting: Student

## 2020-12-03 ENCOUNTER — Telehealth: Payer: Self-pay | Admitting: Internal Medicine

## 2020-12-03 ENCOUNTER — Ambulatory Visit: Payer: Medicare PPO | Admitting: Student

## 2020-12-03 VITALS — BP 146/84 | HR 81 | Ht 71.0 in | Wt 194.0 lb

## 2020-12-03 DIAGNOSIS — I472 Ventricular tachycardia, unspecified: Secondary | ICD-10-CM

## 2020-12-03 DIAGNOSIS — I10 Essential (primary) hypertension: Secondary | ICD-10-CM

## 2020-12-03 DIAGNOSIS — I428 Other cardiomyopathies: Secondary | ICD-10-CM

## 2020-12-03 DIAGNOSIS — I5022 Chronic systolic (congestive) heart failure: Secondary | ICD-10-CM | POA: Diagnosis not present

## 2020-12-03 LAB — CUP PACEART INCLINIC DEVICE CHECK
Date Time Interrogation Session: 20220726100702
Implantable Lead Implant Date: 20140530
Implantable Lead Location: 753860
Implantable Lead Model: 6935
Implantable Pulse Generator Implant Date: 20140530

## 2020-12-03 MED ORDER — CARVEDILOL 25 MG PO TABS: 25.0000 mg | ORAL_TABLET | Freq: Two times a day (BID) | ORAL | 3 refills | Status: DC

## 2020-12-03 NOTE — Telephone Encounter (Signed)
Xray of his lower back shows arthritis.  If his pain does not improve I can refer him to a specialist.  To schedule the MRI of his head he needs to call GI - [phone]

## 2020-12-03 NOTE — Patient Instructions (Signed)
Medication Instructions:  Your physician has recommended you make the following change in your medication:   INCREASE: Carvedilol to '25mg'$  two times daily  *If you need a refill on your cardiac medications before your next appointment, please call your pharmacy*   Lab Work: None If you have labs (blood work) drawn today and your tests are completely normal, you will receive your results only by: [place] (if you have MyChart) OR A paper copy in the mail If you have any lab test that is abnormal or we need to change your treatment, we will call you to review the results. Follow-Up: At [facility], you and your health needs are our priority.  As part of our continuing mission to provide you with exceptional heart care, we have created designated Provider Care Teams.  These Care Teams include your primary Cardiologist (physician) and Advanced Practice Providers (APPs -  Physician Assistants and Nurse Practitioners) who all work together to provide you with the care you need, when you need it.   Your next appointment:   6 month(s)  The format for your next appointment:   In Person  Provider:   You may see Cristopher Peru, MD or one of the following Advanced Practice Providers on your designated Care Team:    Legrand Como "Jonni Sanger" [place], [place]

## 2020-12-04 ENCOUNTER — Other Ambulatory Visit: Payer: Self-pay

## 2020-12-04 ENCOUNTER — Ambulatory Visit
Admission: RE | Admit: 2020-12-04 | Discharge: 2020-12-04 | Disposition: A | Discharge status: Home / Self Care | Payer: Medicare PPO | Source: Ambulatory Visit | Attending: Internal Medicine | Admitting: Internal Medicine

## 2020-12-04 DIAGNOSIS — Z8546 Personal history of malignant neoplasm of prostate: Secondary | ICD-10-CM | POA: Diagnosis not present

## 2020-12-04 DIAGNOSIS — R634 Abnormal weight loss: Secondary | ICD-10-CM | POA: Diagnosis not present

## 2020-12-04 DIAGNOSIS — I700 Atherosclerosis of aorta: Secondary | ICD-10-CM | POA: Diagnosis not present

## 2020-12-04 DIAGNOSIS — N281 Cyst of kidney, acquired: Secondary | ICD-10-CM | POA: Diagnosis not present

## 2020-12-04 DIAGNOSIS — Z86010 Personal history of colonic polyps: Secondary | ICD-10-CM | POA: Diagnosis not present

## 2020-12-04 MED ORDER — IOPAMIDOL (ISOVUE-300) INJECTION 61%
100.0000 mL | Freq: Once | INTRAVENOUS | Status: AC | PRN
  Administered 2020-12-04: 100 mL via INTRAVENOUS

## 2020-12-04 NOTE — Telephone Encounter (Signed)
Message left for patient today 

## 2020-12-05 ENCOUNTER — Other Ambulatory Visit: Payer: Self-pay | Admitting: Internal Medicine

## 2020-12-05 ENCOUNTER — Encounter: Payer: Self-pay | Admitting: Physician Assistant

## 2020-12-05 ENCOUNTER — Other Ambulatory Visit (INDEPENDENT_AMBULATORY_CARE_PROVIDER_SITE_OTHER): Payer: Medicare PPO

## 2020-12-05 ENCOUNTER — Encounter: Payer: Self-pay | Admitting: Internal Medicine

## 2020-12-05 ENCOUNTER — Ambulatory Visit: Payer: Medicare PPO | Admitting: Physician Assistant

## 2020-12-05 VITALS — BP 146/85 | HR 77 | Ht 69.0 in | Wt 196.0 lb

## 2020-12-05 DIAGNOSIS — R413 Other amnesia: Secondary | ICD-10-CM | POA: Diagnosis not present

## 2020-12-05 DIAGNOSIS — I700 Atherosclerosis of aorta: Secondary | ICD-10-CM

## 2020-12-05 DIAGNOSIS — F03A0 Unspecified dementia, mild, without behavioral disturbance, psychotic disturbance, mood disturbance, and anxiety: Secondary | ICD-10-CM

## 2020-12-05 DIAGNOSIS — F0390 Unspecified dementia without behavioral disturbance: Secondary | ICD-10-CM | POA: Diagnosis not present

## 2020-12-05 HISTORY — DX: Atherosclerosis of aorta: I70.0

## 2020-12-05 LAB — FERRITIN: Ferritin: 180 ng/mL (ref 22.0–322.0)

## 2020-12-05 NOTE — Patient Instructions (Signed)
It was a pleasure to see you today at our office.   Recommendations:  Neurocognitive evaluation at our office CT of the brain, the office will call you to arrange you appointment Check Ferritin  at the lab Follow up once the results of the above are available   RECOMMENDATIONS FOR ALL PATIENTS WITH MEMORY PROBLEMS: 1. Continue to exercise (Recommend 30 minutes of walking everyday, or 3 hours every week) 2. Increase social interactions - continue going to [place] and enjoy social gatherings with friends and family 3. Eat healthy, avoid fried foods and eat more fruits and vegetables 4. Maintain adequate blood pressure, blood sugar, and blood cholesterol level. Reducing the risk of stroke and cardiovascular disease also helps promoting better memory. 5. Avoid stressful situations. Live a simple life and avoid aggravations. Organize your time and prepare for the next day in anticipation. 6. Sleep well, avoid any interruptions of sleep and avoid any distractions in the bedroom that may interfere with adequate sleep quality 7. Avoid sugar, avoid sweets as there is a strong link between excessive sugar intake, diabetes, and cognitive impairment We discussed the Mediterranean diet, which has been shown to help patients reduce the risk of progressive memory disorders and reduces cardiovascular risk. This includes eating fish, eat fruits and green leafy vegetables, nuts like almonds and hazelnuts, walnuts, and also use olive oil. Avoid fast foods and fried foods as much as possible. Avoid sweets and sugar as sugar use has been linked to worsening of memory function.  There is always a concern of gradual progression of memory problems. If this is the case, then we may need to adjust level of care according to patient needs. Support, both to the patient and caregiver, should then be put into place.      You have been referred for a neuropsychological evaluation (i.e., evaluation of memory and thinking  abilities). Please bring someone with you to this appointment if possible, as it is helpful for the doctor to hear from both you and another adult who knows you well. Please bring eyeglasses and hearing aids if you wear them.    The evaluation will take approximately 3 hours and has two parts:   The first part is a clinical interview with the neuropsychologist (Dr. Melvyn Novas or Dr. Nicole Kindred). During the interview, the neuropsychologist will speak with you and the individual you brought to the appointment.    The second part of the evaluation is testing with the doctor's technician Hinton Dyer or Maudie Mercury). During the testing, the technician will ask you to remember different types of material, solve problems, and answer some questionnaires. Your family member will not be present for this portion of the evaluation.   Please note: We must reserve several hours of the neuropsychologist's time and the psychometrician's time for your evaluation appointment. As such, there is a No-Show fee of $100. If you are unable to attend any of your appointments, please contact our office as soon as possible to reschedule.    FALL PRECAUTIONS: Be cautious when walking. Scan the area for obstacles that may increase the risk of trips and falls. When getting up in the mornings, sit up at the edge of the bed for a few minutes before getting out of bed. Consider elevating the bed at the head end to avoid drop of blood pressure when getting up. Walk always in a well-lit room (use night lights in the walls). Avoid area rugs or power cords from appliances in the middle of the walkways. Use  a walker or a cane if necessary and consider physical therapy for balance exercise. Get your eyesight checked regularly.  FINANCIAL OVERSIGHT: Supervision, especially oversight when making financial decisions or transactions is also recommended.  HOME SAFETY: Consider the safety of the kitchen when operating appliances like stoves, microwave oven, and  blender. Consider having supervision and share cooking responsibilities until no longer able to participate in those. Accidents with firearms and other hazards in the house should be identified and addressed as well.   ABILITY TO BE LEFT ALONE: If patient is unable to contact 911 operator, consider using LifeLine, or when the need is there, arrange for someone to stay with patients. Smoking is a fire hazard, consider supervision or cessation. Risk of wandering should be assessed by caregiver and if detected at any point, supervision and safe proof recommendations should be instituted.  MEDICATION SUPERVISION: Inability to self-administer medication needs to be constantly addressed. Implement a mechanism to ensure safe administration of the medications.   DRIVING: Regarding driving, in patients with progressive memory problems, driving will be impaired. We advise to have someone else do the driving if trouble finding directions or if minor accidents are reported. Independent driving assessment is available to determine safety of driving.   If you are interested in the driving assessment, you can contact the following:  [employer] in [place]  [place] [phone] or [phone]    [place] refers to food and lifestyle choices that are based on the traditions of countries located on the [employer]. This way of eating has been shown to help prevent certain conditions and improve outcomes for people who have chronic diseases, like kidney disease and heart disease. What are tips for following this plan? Lifestyle  Cook and eat meals together with your family, when possible. Drink enough fluid to keep your urine clear or pale yellow. Be physically active every day. This includes: Aerobic exercise like running or swimming. Leisure  activities like gardening, walking, or housework. Get 7-8 hours of sleep each night. If recommended by your health care provider, drink red wine in moderation. This means 1 glass a day for nonpregnant women and 2 glasses a day for men. A glass of wine equals 5 oz (150 mL). Reading food labels  Check the serving size of packaged foods. For foods such as rice and pasta, the serving size refers to the amount of cooked product, not dry. Check the total fat in packaged foods. Avoid foods that have saturated fat or trans fats. Check the ingredients list for added sugars, such as corn syrup. Shopping  At the grocery store, buy most of your food from the areas near the walls of the store. This includes: Fresh fruits and vegetables (produce). Grains, beans, nuts, and seeds. Some of these may be available in unpackaged forms or large amounts (in bulk). Fresh seafood. Poultry and eggs. Low-fat dairy products. Buy whole ingredients instead of prepackaged foods. Buy fresh fruits and vegetables in-season from local farmers markets. Buy frozen fruits and vegetables in resealable bags. If you do not have access to quality fresh seafood, buy precooked frozen shrimp or canned fish, such as tuna, salmon, or sardines. Buy small amounts of raw or cooked vegetables, salads, or olives from the deli or salad bar at your store. Stock your pantry so you always have certain foods on hand, such as olive oil, canned tuna, canned tomatoes, rice, pasta,  and beans. Cooking  Cook foods with extra-virgin olive oil instead of using butter or other vegetable oils. Have meat as a side dish, and have vegetables or grains as your main dish. This means having meat in small portions or adding small amounts of meat to foods like pasta or stew. Use beans or vegetables instead of meat in common dishes like chili or lasagna. Experiment with different cooking methods. Try roasting or broiling vegetables instead of steaming or sauteing  them. Add frozen vegetables to soups, stews, pasta, or rice. Add nuts or seeds for added healthy fat at each meal. You can add these to yogurt, salads, or vegetable dishes. Marinate fish or vegetables using olive oil, lemon juice, garlic, and fresh herbs. Meal planning  Plan to eat 1 vegetarian meal one day each week. Try to work up to 2 vegetarian meals, if possible. Eat seafood 2 or more times a week. Have healthy snacks readily available, such as: Vegetable sticks with hummus. Greek yogurt. Fruit and nut trail mix. Eat balanced meals throughout the week. This includes: Fruit: 2-3 servings a day Vegetables: 4-5 servings a day Low-fat dairy: 2 servings a day Fish, poultry, or lean meat: 1 serving a day Beans and legumes: 2 or more servings a week Nuts and seeds: 1-2 servings a day Whole grains: 6-8 servings a day Extra-virgin olive oil: 3-4 servings a day Limit red meat and sweets to only a few servings a month What are my food choices? Mediterranean diet Recommended Grains: Whole-grain pasta. Brown rice. Bulgar wheat. Polenta. Couscous. Whole-wheat bread. [place]. Vegetables: Artichokes. Beets. Broccoli. Cabbage. Carrots. Eggplant. Green beans. Chard. Kale. Spinach. Onions. Leeks. Peas. Squash. Tomatoes. Peppers. Radishes. Fruits: Apples. Apricots. Avocado. Berries. Bananas. Cherries. Dates. Figs. Grapes. Lemons. Melon. Oranges. Peaches. Plums. Pomegranate. Meats and other protein foods: Beans. Almonds. Sunflower seeds. Pine nuts. Peanuts. [place]. Salmon. Scallops. Shrimp. [place]. Tilapia. Clams. Oysters. Eggs. Dairy: Low-fat milk. Cheese. Greek yogurt. Beverages: Water. Red wine. Herbal tea. Fats and oils: Extra virgin olive oil. Avocado oil. Grape seed oil. Sweets and desserts: [place] yogurt with honey. Baked apples. Poached pears. Trail mix. Seasoning and other foods: Basil. Cilantro. Coriander. Cumin. Mint. Parsley. Sage. Rosemary. Tarragon. Garlic. Oregano. Thyme. Pepper.  Balsalmic vinegar. Tahini. Hummus. Tomato sauce. Olives. Mushrooms. Limit these Grains: Prepackaged pasta or rice dishes. Prepackaged cereal with added sugar. Vegetables: Deep fried potatoes (french fries). Fruits: Fruit canned in syrup. Meats and other protein foods: Beef. Pork. Lamb. Poultry with skin. Hot dogs. [place] Salines. Dairy: Ice cream. Sour cream. Whole milk. Beverages: Juice. Sugar-sweetened soft drinks. Beer. Liquor and spirits. Fats and oils: Butter. Canola oil. Vegetable oil. Beef fat (tallow). Lard. Sweets and desserts: Cookies. Cakes. Pies. Candy. Seasoning and other foods: Mayonnaise. Premade sauces and marinades. The items listed may not be a complete list. Talk with your dietitian about what dietary choices are right for you. Summary The Mediterranean diet includes both food and lifestyle choices. Eat a variety of fresh fruits and vegetables, beans, nuts, seeds, and whole grains. Limit the amount of red meat and sweets that you eat. Talk with your health care provider about whether it is safe for you to drink red wine in moderation. This means 1 glass a day for nonpregnant women and 2 glasses a day for men. A glass of wine equals 5 oz (150 mL). This information is not intended to replace advice given to you by your health care provider. Make sure you discuss any questions you have with your health care provider.  Document Released: 12/19/2015 Document Revised: 01/21/2016 Document Reviewed: 12/19/2015 Elsevier Interactive Patient Education  2017 [employer].

## 2020-12-05 NOTE — Progress Notes (Addendum)
Assessment/Plan:   [name] is a 70 y.o. year old male with risk factors including, hypertension, hyperlipidemia, anemia. sCHF, ICD /h/o VTAch, NICM, OSA not on CPAP, Covid 19 in April , B12 deficiency, prostate cancer,  seen today for evaluation of memory loss. 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  Discussed the importance of regular daily schedule with inclusion of crossword puzzles to maintain brain function.  Continue to monitor mood with PCP.  Stay active at least 30 minutes at least 3 times a week.  Naps should be scheduled and should be no longer than 60 minutes and should not occur after 2 PM.  Mediterranean diet is recommended  Folllow up once results above are available   Subjective:    The patient is seen in neurologic consultation at the request of Biagio Borg, MD for the evaluation of memory.  The patient is accompanied by [name] who supplements the history. He is a 70 y.o. year old male who has had memory issues for about 6 months "it is not like it used to be, takes a little longer to get there"  His wife reports that he has been asking the same questions more frequently "When are we going to visit xyz? Or When are we going to [facility]" etc. He does not repeat the same stories though. 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 His wife also says that his attention has changed "it was always an issue, but he pays less attention than before".  He denies irritability.  No paranoia or hallucinations. He denies being depressed, but he does "feel tired all the time". His appetite is decreased and has lost 30 lbs since April, this is being followed by PCP. "I come back from work sometimes at midnight and I don't feel like eating". He denies taking any Ensure or similar food supplements.  He admits to not doing any activities that stimulate him, or any other physical activities outside of work.  "Since COVID in April of this year, and feels still very tired ".  He sleeps well, and denies sleepwalking or vivid dreams.  He is independent of dressing and bathing.  He takes his medications and does not forget to take any doses.  He manages his own finances without missing any payments.  Denies leaving objects in unusual places.  The patient does not cook.  He denies leaving the faucet on when taking a shower.  He ambulates without difficulty without the use of a walker or a cane.  At times, he is so tired that "stumbles ".  The patient drives independently, without getting lost.  He does not use a GPS.  He denies any headaches, falls, injuries to the head, double vision, dizziness, focal numbness or tingling, unilateral weakness or tremors.  He has chronic incontinence, denies retention.  He denies any constipation or diarrhea.  Denies anosmia.  Denies a history  of alcohol, or tobacco.  Family history is significant for mother, sister, and maternal aunt all had dementia.  Brother is alive, but he lives in an ALF with dementia.  He was born in [place], he has college education, graduated from [employer] in Social Studies  Pertinent labs 11/14/2020 A1c 6.4 B12 938 TSH 1.89 Lipid panel normal Sed rate normal at 18, proBNP 19 normal, vitamin D34.68, normal, CMP unremarkable, hepatic function panel with  alkaline phosphatase 126, stable from prior.  PSA 0, urinalysis stable without changes from prior, CBC remarkable for hemoglobin of 11, low MCV 71.8, hematocrit 34.4, normal platelets. CT scan of the chest abdomen and pelvis are pending. QT/QTC 362/420   Allergies  Allergen Reactions   Zantac [Ranitidine Hcl]     itching    Current Outpatient Medications  Medication Instructions   ACCU-CHEK GUIDE test strip USE TO CHECK SUGAR UP TO 4 TIMES DAILY E11.9   amLODipine (NORVASC) 5 MG tablet TAKE 1 TABLET BY MOUTH DAILY. FOLLOW-UP APPT DUE IN DEC MUST SEE PROVIDER FOR FUTURE REFILLS   ASPIRIN LOW DOSE 81 MG EC tablet TAKE 1 TABLET BY MOUTH EVERY DAY   atorvastatin (LIPITOR) 20 MG tablet TAKE 1 TABLET BY MOUTH DAILY EXCEPT 1/2 TABLET ON TUESDAY, THURSDAY AND SATURDAY.   carvedilol (COREG) 25 mg, Oral, 2 times daily with meals   digoxin (LANOXIN) 0.125 MG tablet TAKE 1 TABLET BY MOUTH EVERY DAY   famotidine (PEPCID) 20 MG tablet TAKE 1 TABLET BY MOUTH TWICE A DAY   Lancets (ONETOUCH ULTRASOFT) lancets Use as instructed to test sugar daily and prn. Dx E11.9   omeprazole (PRILOSEC) 20 MG capsule TAKE 1 CAPSULE BY MOUTH DAILY. RECOMMENDED TO TAKE 30 MIN BEFORE BREAKFAST. *MAX PER INSURANCE   Prednisol Ace-Moxiflox-Bromfen 1-0.5-0.075 % SUSP 1 drop, Left Eye, 4 times daily   ramipril (ALTACE) 10 MG capsule TAKE 1 CAPSULE BY MOUTH 2 TIMES DAILY.   sitaGLIPtin-metformin (JANUMET) 50-1000 MG tablet 1 tablet, Oral, Daily, Take with a meal   sotalol (BETAPACE) 120 MG tablet TAKE 1 TABLET BY MOUTH TWICE A DAY   vitamin B-12 (CYANOCOBALAMIN) 1,000 mcg, Oral, Daily     VITALS:   Vitals:   12/05/20 0921  BP: (!) 146/85  Pulse: 77  SpO2: 100%  Weight: 196 lb (88.9 kg)  Height: '5\' 9"'$  (1.753 m)    PHYSICAL EXAM  General: Flat affect, well-groomed HEENT:  Normocephalic, atraumatic. The mucous membranes are moist. The superficial temporal arteries are without ropiness or tenderness. Cardiovascular:  Regular rate and rhythm. Lungs: Clear to auscultation bilaterally. Neck: There are no carotid bruits noted bilaterally.  NEUROLOGICAL: Montreal Cognitive Assessment  12/05/2020  Visuospatial/ Executive (0/5) 3  Naming (0/3) 2  Attention: Read list of digits (0/2) 2  Attention: Read list of letters (0/1) 1  Attention: Serial 7 subtraction starting at 100 (0/3) 1  Language: Repeat phrase (0/2) 0  Language : Fluency (0/1) 0  Abstraction (0/2) 0  Delayed Recall (0/5) 1  Orientation (0/6) 6  Total 16   No flowsheet data found.  No flowsheet data found.   Orientation:  Alert and oriented to person, place and time. No aphasia or dysarthria. Fund of knowledge is appropriate. Recent memory impaired and remote memory intact.  Attention and concentration are reduced  Able to name objects and repeat phrases. Delayed recall  1/5.  He was unable to do serial sevens. Cranial nerves: There is good facial symmetry. Extraocular muscles are intact and visual fields are full  to confrontational testing. Speech is fluent and clear. Soft palate rises symmetrically and there is no tongue deviation. Hearing is intact to conversational tone. Tone: Tone is good throughout. Sensation: Sensation is intact to light touch and pinprick throughout. Vibration is intact at the bilateral big toe.There is no extinction with double simultaneous stimulation. There is no sensory dermatomal level identified. Coordination: The patient has no difficulty with RAM's or FNF bilaterally. Normal finger to nose  Motor: Strength is 5/5 in the bilateral upper and lower extremities. There is no pronator drift. There are no fasciculations noted. DTR's: Deep tendon reflexes are 2/4 at the bilateral biceps, triceps, brachioradialis, patella and achilles.  Plantar responses are downgoing bilaterally. Gait and Station: The patient is able to ambulate without difficulty.The patient is able to heel toe walk without any difficulty.The patient is  able to ambulate in a tandem fashion. The patient is able to stand in the Romberg position.     Thank you for allowing [place] the opportunity to participate in the care of this nice patient. Please do not hesitate to contact us for any questions or concerns.   Total time spent on today's visit was  60 minutes, including both face-to-face time and nonface-to-face time.  Time included that spent on review of records (prior notes available to me/labs/imaging if pertinent), discussing treatment and goals, answering patient's questions and coordinating care.  Cc:  Binnie Rail, MD  Sharene Butters 12/05/2020 12:08 PM

## 2020-12-09 ENCOUNTER — Encounter: Payer: Self-pay | Admitting: Internal Medicine

## 2020-12-10 ENCOUNTER — Telehealth: Payer: Self-pay

## 2020-12-10 NOTE — Chronic Care Management (AMB) (Signed)
    Chronic Care Management Pharmacy Assistant   Name: [name]  MRN: [id number] DOB: [birth year]  [name] is an 70 y.o. year old male who presents for his initial CCM visit with the clinical pharmacist.  Recent office visits:  12/02/20-[name], MD (PCP) General follow up. Will decrease janumet 50-100 mg to once daily. Follow up in 3-4 weeks. 08/09/20-[name], MD (PCP) Annual exam. A Neurology referral was ordered for memory loss and OSA. Labs ordered. Follow up in 6 months. Recent consult visits:  12/05/20-Sara E. Virl Cagey (Neurology) Establishing care. Labs ordered. Return after [place] exam and CT. 12/03/20-Michael Joesph July (Cardiology) Routine follow up. EKG completed. Increase coreg to 25 mg BID. Follow up in 6 months. 11/14/20-James Quin Hoop, MD. Seen for fatigue and general weakness. Labs ordered. Follow up in 2 weeks Hospital visits:  None in previous 6 months  Medications: Outpatient Encounter Medications as of 12/10/2020  Medication Sig   ACCU-CHEK GUIDE test strip USE TO CHECK SUGAR UP TO 4 TIMES DAILY E11.9   amLODipine (NORVASC) 5 MG tablet TAKE 1 TABLET BY MOUTH DAILY. FOLLOW-UP APPT DUE IN DEC MUST SEE PROVIDER FOR FUTURE REFILLS   ASPIRIN LOW DOSE 81 MG EC tablet TAKE 1 TABLET BY MOUTH EVERY DAY   atorvastatin (LIPITOR) 20 MG tablet TAKE 1 TABLET BY MOUTH DAILY EXCEPT 1/2 TABLET ON TUESDAY, THURSDAY AND SATURDAY.   carvedilol (COREG) 25 MG tablet Take 1 tablet (25 mg total) by mouth 2 (two) times daily with a meal.   digoxin (LANOXIN) 0.125 MG tablet TAKE 1 TABLET BY MOUTH EVERY DAY   famotidine (PEPCID) 20 MG tablet TAKE 1 TABLET BY MOUTH TWICE A DAY   Lancets (ONETOUCH ULTRASOFT) lancets Use as instructed to test sugar daily and prn. Dx E11.9   omeprazole (PRILOSEC) 20 MG capsule TAKE 1 CAPSULE BY MOUTH DAILY. RECOMMENDED TO TAKE 30 MIN BEFORE BREAKFAST. *MAX PER INSURANCE   Prednisol Ace-Moxiflox-Bromfen 1-0.5-0.075 % SUSP Place 1 drop into  the left eye 4 (four) times daily.   ramipril (ALTACE) 10 MG capsule TAKE 1 CAPSULE BY MOUTH 2 TIMES DAILY.   sitaGLIPtin-metformin (JANUMET) 50-1000 MG tablet Take 1 tablet by mouth daily. Take with a meal   sotalol (BETAPACE) 120 MG tablet TAKE 1 TABLET BY MOUTH TWICE A DAY   vitamin B-12 (CYANOCOBALAMIN) 1000 MCG tablet Take 1 tablet (1,000 mcg total) by mouth daily.   No facility-administered encounter medications on file as of 12/10/2020.   AmLODipine (NORVASC) 5 MG tablet Last filled:02/07/20 90 DS ASPIRIN LOW DOSE 81 MG EC tablet Last filled: None noted Atorvastatin (LIPITOR) 20 MG tablet Last filled:01/25/20 90 DS Carvedilol (COREG) 25 MG tablet Last filled:02/19/20 90 DS Digoxin (LANOXIN) 0.125 MG tablet Last filled:02/16/20 90 DS Famotidine (PEPCID) 20 MG tablet Last filled:01/27/20 90 DS Omeprazole (PRILOSEC) 20 MG capsule Last filled:03/06/20 90 DS Prednisol Ace-Moxiflox-Bromfen 1-0.5-0.075 % SUSP Last filled:11/27/19 0 DS Ramipril (ALTACE) 10 MG capsule Last filled:01/28/20 90 DS SitaGLIPtin-metformin (JANUMET) 50-1000 MG tablet Last filled:11/02/19 90 DS Sotalol (BETAPACE) 120 MG tablet Last filled:02/28/20 90 DS Vitamin B-12 (CYANOCOBALAMIN) 1000 MCG tablet Last filled: None noted   Star Rating Drugs: SitaGLIPtin-metformin (JANUMET) 50-1000 MG tablet Last filled:11/02/19 90 DS Ramipril (ALTACE) 10 MG capsule Last filled:01/28/20 90 DS Atorvastatin (LIPITOR) 20 MG tablet Last filled:01/25/20 90 DS  Myriam Elta [place], [place]

## 2020-12-19 ENCOUNTER — Ambulatory Visit (INDEPENDENT_AMBULATORY_CARE_PROVIDER_SITE_OTHER): Payer: Medicare PPO

## 2020-12-19 DIAGNOSIS — I428 Other cardiomyopathies: Secondary | ICD-10-CM | POA: Diagnosis not present

## 2020-12-19 LAB — CUP PACEART REMOTE DEVICE CHECK
Battery Remaining Longevity: 48 mo
Battery Voltage: 2.98 V
Brady Statistic RV Percent Paced: 0.01 %
Date Time Interrogation Session: 20220811012506
HighPow Impedance: 65 Ohm
Implantable Lead Implant Date: 20140530
Implantable Lead Location: 753860
Implantable Lead Model: 6935
Implantable Pulse Generator Implant Date: 20140530
Lead Channel Impedance Value: 456 Ohm
Lead Channel Impedance Value: 475 Ohm
Lead Channel Pacing Threshold Amplitude: 0.5 V
Lead Channel Pacing Threshold Pulse Width: 0.4 ms
Lead Channel Sensing Intrinsic Amplitude: 5.875 mV
Lead Channel Sensing Intrinsic Amplitude: 5.875 mV
Lead Channel Setting Pacing Amplitude: 2.5 V
Lead Channel Setting Pacing Pulse Width: 0.4 ms
Lead Channel Setting Sensing Sensitivity: 0.3 mV

## 2020-12-24 ENCOUNTER — Ambulatory Visit (INDEPENDENT_AMBULATORY_CARE_PROVIDER_SITE_OTHER): Payer: Medicare PPO

## 2020-12-24 ENCOUNTER — Other Ambulatory Visit: Payer: Self-pay

## 2020-12-24 DIAGNOSIS — E1151 Type 2 diabetes mellitus with diabetic peripheral angiopathy without gangrene: Secondary | ICD-10-CM

## 2020-12-24 DIAGNOSIS — E7849 Other hyperlipidemia: Secondary | ICD-10-CM | POA: Diagnosis not present

## 2020-12-24 DIAGNOSIS — I10 Essential (primary) hypertension: Secondary | ICD-10-CM | POA: Diagnosis not present

## 2020-12-24 DIAGNOSIS — I5022 Chronic systolic (congestive) heart failure: Secondary | ICD-10-CM | POA: Diagnosis not present

## 2020-12-24 DIAGNOSIS — E538 Deficiency of other specified B group vitamins: Secondary | ICD-10-CM

## 2020-12-24 NOTE — Progress Notes (Signed)
Chronic Care Management Pharmacy Note  12/24/2020 Name:  [name] MRN:  [id number] DOB:  [birth year]  Summary: - Patient confirms that he has started his increased dose of carvedilol after last cardiology appointment - taking $Remove'25mg'TrInBgg$  twice daily - notes to symptoms of orthostatic hypotension on occasion now- reports that feeling will subside after a few moments - has not checked BP or HR since increasing dosage -Has reduced dosage of janumet with last PCP visit, has not checked blood sugar since change in dosing -Reports that his appetite has been decreased as of late, typically only eating twice daily - possible cause for noted 30lbs of weight loss  Recommendations/Changes made from today's visit: - Recommending for patient to start recording blood pressure and heart rate (patient owns BP cuff), reviewed goals, patient to reach out should BP be <120/60 or if HR is <60 bpm -Patient to start monitoring blood sugar every day - every other day, patient to reach out should BG be <80 or if it is averaging >150 -Patient to reduce consumption of carbohydrates and sweets/ fried foots, increase lean proteins and vegetable intake   Subjective: [name] is an 70 y.o. year old male who is a primary patient of Burns, Claudina Lick, MD.  The CCM team was consulted for assistance with disease management and care coordination needs.    Engaged with patient by telephone for initial visit in response to provider referral for pharmacy case management and/or care coordination services.   Consent to Services:  The patient was given the following information about Chronic Care Management services today, agreed to services, and gave verbal consent: 1. CCM service includes personalized support from designated clinical staff supervised by the primary care provider, including individualized plan of care and coordination with other care providers 2. 24/7 contact phone numbers for assistance for urgent and routine  care needs. 3. Service will only be billed when office clinical staff spend 20 minutes or more in a month to coordinate care. 4. Only one practitioner may furnish and bill the service in a calendar month. 5.The patient may stop CCM services at any time (effective at the end of the month) by phone call to the office staff. 6. The patient will be responsible for cost sharing (co-pay) of up to 20% of the service fee (after annual deductible is met). Patient agreed to services and consent obtained.  Patient Care Team: Binnie Rail, MD as PCP - General (Internal Medicine) Evans Lance, MD as PCP - Cardiology (Cardiology) Evans Lance, MD as PCP - Electrophysiology (Cardiology) Ardis Hughs, MD as Attending Physician (Urology) Gatha Mayer, MD as Consulting Physician (Gastroenterology) Delice Bison, Darnelle Maffucci, [facility] as Pharmacist (Pharmacist)  Recent office visits:  12/02/20-[name], MD (PCP) General follow up. Will decrease janumet 50-100 mg to once daily. Follow up in 3-4 weeks. 11/14/2020 - Cathlean Cower MD - follow up for fatigue and weightloss (30lbs) - referred to neurology - follow up with PCP in 2 weeks  08/09/20-[name], MD (PCP) Annual exam. A Neurology referral was ordered for memory loss and OSA. Labs ordered. Follow up in 6 months.  Recent consult visits:  12/05/20-Sara E. Virl Cagey (Neurology) Establishing care. Labs ordered. Return after Glenn exam and CT. - referred to neuropsychologist for exam 12/03/20-Michael Joesph July (Cardiology) Routine follow up. EKG completed - NSR with stable QTc. Increase coreg to 25 mg BID. Follow up in 6 months. Hospital visits:  None in previous 6 months  Objective:  Lab Results  Component Value Date   CREATININE 0.89 11/14/2020   BUN 14 11/14/2020   GFR 87.45 11/14/2020   GFRNONAA 78 04/13/2019   GFRAA 90 04/13/2019   NA 143 11/14/2020   K 4.2 11/14/2020   CALCIUM 9.2 11/14/2020   CO2 25 11/14/2020   GLUCOSE 98  11/14/2020    Lab Results  Component Value Date/Time   HGBA1C 6.4 11/14/2020 12:04 PM   HGBA1C 6.7 (H) 08/09/2020 09:04 AM   FRUCTOSAMINE 247 07/10/2014 08:57 AM   GFR 87.45 11/14/2020 12:04 PM   GFR 70.95 08/09/2020 09:04 AM   MICROALBUR 1.2 04/03/2014 11:43 AM   MICROALBUR 1.5 08/10/2013 08:48 AM    Last diabetic Eye exam:  Lab Results  Component Value Date/Time   HMDIABEYEEXA No Retinopathy 11/15/2017 12:00 AM    Last diabetic Foot exam:  No results found for: HMDIABFOOTEX   Lab Results  Component Value Date   CHOL 108 11/14/2020   HDL 33.60 (L) 11/14/2020   LDLCALC 62 11/14/2020   TRIG 63.0 11/14/2020   CHOLHDL 3 11/14/2020    Hepatic Function Latest Ref Rng & Units 11/14/2020 08/09/2020 10/17/2019  Total Protein 6.0 - 8.3 g/dL 6.9 7.3 7.2  Albumin 3.5 - 5.2 g/dL 4.1 4.0 4.0  AST 0 - 37 U/L $Remo'11 14 13  'KoaDF$ ALT 0 - 53 U/L $Remo'9 14 13  'mJzDU$ Alk Phosphatase 39 - 117 U/L 97 126(H) 142(H)  Total Bilirubin 0.2 - 1.2 mg/dL 0.5 0.5 0.5  Bilirubin, Direct 0.0 - 0.3 mg/dL 0.1 - -    Lab Results  Component Value Date/Time   TSH 1.89 11/14/2020 12:04 PM   TSH 2.90 08/09/2020 09:04 AM    CBC Latest Ref Rng & Units 11/14/2020 08/09/2020 10/17/2019  WBC 4.0 - 10.5 K/uL 7.6 6.5 7.3  Hemoglobin 13.0 - 17.0 g/dL 11.0(L) 12.2(L) 11.6(L)  Hematocrit 39.0 - 52.0 % 34.4(L) 38.2(L) 36.1(L)  Platelets 150.0 - 400.0 K/uL 230.0 224.0 215.0    Lab Results  Component Value Date/Time   VD25OH 34.68 11/14/2020 12:04 PM    Clinical ASCVD: No  The ASCVD Risk score Mikey Bussing DC Jr., et al., 2013) failed to calculate for the following reasons:   The valid total cholesterol range is 130 to 320 mg/dL    Depression screen [facility] 2/9 11/14/2020 10/18/2019 08/24/2019  Decreased Interest 0 0 0  Down, Depressed, Hopeless 0 0 0  PHQ - 2 Score 0 0 0  Altered sleeping - - -  Tired, decreased energy - - -  Change in appetite - - -  Feeling bad or failure about yourself  - - -  Trouble concentrating - - -  Moving slowly or  fidgety/restless - - -  Suicidal thoughts - - -  PHQ-9 Score - - -  Difficult doing work/chores - - -  Some recent data might be hidden    Social History   Tobacco Use  Smoking Status Never  Smokeless Tobacco Never   BP Readings from Last 3 Encounters:  12/05/20 (!) 146/85  12/03/20 (!) 146/84  12/02/20 132/80   Pulse Readings from Last 3 Encounters:  12/05/20 77  12/03/20 81  12/02/20 79   Wt Readings from Last 3 Encounters:  12/05/20 196 lb (88.9 kg)  12/03/20 194 lb (88 kg)  12/02/20 194 lb (88 kg)   BMI Readings from Last 3 Encounters:  12/05/20 28.94 kg/m  12/03/20 27.06 kg/m  12/02/20 27.06 kg/m    Assessment/Interventions: Review of patient past  medical history, allergies, medications, health status, including review of consultants reports, laboratory and other test data, was performed as part of comprehensive evaluation and provision of chronic care management services.   SDOH:  (Social Determinants of Health) assessments and interventions performed: Yes  SDOH Screenings   Alcohol Screen: Low Risk    Last Alcohol Screening Score (AUDIT): 0  Depression (PHQ2-9): Low Risk    PHQ-2 Score: 0  Financial Resource Strain: Low Risk    Difficulty of Paying Living Expenses: Not hard at all  Food Insecurity: No Food Insecurity   Worried About Charity fundraiser in the Last Year: Never true   Ran Out of Food in the Last Year: Never true  Housing: Low Risk    Last Housing Risk Score: 0  Physical Activity: Sufficiently Active   Days of Exercise per Week: 5 days   Minutes of Exercise per Session: 30 min  Social Connections: Engineer, building services of Communication with Friends and Family: More than three times a week   Frequency of Social Gatherings with Friends and Family: More than three times a week   Attends Religious Services: More than 4 times per year   Active Member of [employer] or Organizations: Yes   Attends Music therapist: More  than 4 times per year   Marital Status: Married  Stress: No Stress Concern Present   Feeling of Stress : Not at all  Tobacco Use: Low Risk    Smoking Tobacco Use: Never   Smokeless Tobacco Use: Never  Transportation Needs: No Transportation Needs   Lack of Transportation (Medical): No   Lack of Transportation (Non-Medical): No    CCM Care Plan  Allergies  Allergen Reactions   Zantac [Ranitidine Hcl]     itching    Medications Reviewed Today     Reviewed by [name], [facility] (Pharmacist) on 12/24/20 at [phone]  Med List Status: <None>   Medication Order Taking? Sig Documenting Provider Last Dose Status Informant  ACCU-CHEK GUIDE test strip [id number] Yes USE TO CHECK SUGAR UP TO 4 TIMES DAILY E11.9 Binnie Rail, MD Taking Active   amLODipine (NORVASC) 5 MG tablet [id number] Yes TAKE 1 TABLET BY MOUTH DAILY. FOLLOW-UP APPT DUE IN DEC MUST SEE PROVIDER FOR FUTURE REFILLS Binnie Rail, MD Taking Active   ASPIRIN LOW DOSE 81 MG EC tablet [id number] Yes TAKE 1 TABLET BY MOUTH EVERY DAY Burns, Claudina Lick, MD Taking Active   atorvastatin (LIPITOR) 20 MG tablet [id number] Yes TAKE 1 TABLET BY MOUTH DAILY EXCEPT 1/2 TABLET ON TUESDAY, THURSDAY AND SATURDAY. Binnie Rail, MD Taking Active   carvedilol (COREG) 25 MG tablet [id number] Yes Take 1 tablet (25 mg total) by mouth 2 (two) times daily with a meal. Shirley Friar, PA-C Taking Active   digoxin (LANOXIN) 0.125 MG tablet [id number] Yes TAKE 1 TABLET BY MOUTH EVERY DAY Binnie Rail, MD Taking Active   famotidine (PEPCID) 20 MG tablet [id number] Yes TAKE 1 TABLET BY MOUTH TWICE A DAY Burns, Claudina Lick, MD Taking Active   Lancets [facility] ULTRASOFT) lancets [id number] Yes Use as instructed to test sugar daily and prn. Dx E11.9 Binnie Rail, MD Taking Active   omeprazole (PRILOSEC) 20 MG capsule [id number] Yes TAKE 1 CAPSULE BY MOUTH DAILY. RECOMMENDED TO TAKE 30 MIN BEFORE BREAKFAST. *MAX PER INSURANCE Binnie Rail, MD Taking Active    ramipril (ALTACE) 10 MG capsule [id number] Yes TAKE 1 CAPSULE BY MOUTH 2 TIMES  DAILY. Binnie Rail, MD Taking Active   sitaGLIPtin-metformin [facility]) 50-1000 MG tablet [id number] Yes Take 1 tablet by mouth daily. Take with a meal Burns, Claudina Lick, MD Taking Active   sotalol (BETAPACE) 120 MG tablet [id number] Yes TAKE 1 TABLET BY MOUTH TWICE A DAY Burns, Claudina Lick, MD Taking Active   vitamin B-12 (CYANOCOBALAMIN) 1000 MCG tablet [id number] Yes Take 1 tablet (1,000 mcg total) by mouth daily. Binnie Rail, MD Taking Active             Patient Active Problem List   Diagnosis Date Noted   Mild dementia ([place]) 12/05/2020   Aortic atherosclerosis ([place]) 12/05/2020   Midline low back pain without sciatica 12/02/2020   Fatigue 12/02/2020   Memory loss 11/15/2020   Weight loss 11/15/2020   Memory difficulties 08/09/2020   Chronic back pain 04/25/2019   Neuralgia 10/19/2018   Acute cystitis without hematuria 09/05/2018   Chronic left-sided thoracic back pain 08/23/2018   Right foot pain 12/15/2017   Hip pain 12/14/2016   Left knee pain 12/14/2016   Shingles 07/28/2016   GERD (gastroesophageal reflux disease) 07/29/2015   Malignant neoplasm of prostate ([place]) 09/27/2014   RLS (restless legs syndrome) 11/29/2012   Obstructive sleep apnea 11/10/2012   Elevated alkaline phosphatase level 10/04/2012   Vitamin B 12 deficiency 53/64/6803   Chronic systolic heart failure ([place]) 01/27/2011   Mild anemia 01/20/2011   Ventricular tachycardia (H[facility])    NICM (nonischemic cardiomyopathy) ([place])    Diabetes ([place]) 08/26/2009   Hyperlipidemia 08/26/2009   Essential hypertension 08/26/2009   PROSTATE CANCER, HX OF 08/26/2009   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   Barrett's esophagus 09/05/2004    Immunization History  Administered Date(s) Administered   Fluad Quad(high Dose 65+) 02/06/2019   Influenza Split 03/10/2012   Influenza, High Dose Seasonal PF 06/16/2016,  06/17/2017   Influenza, Seasonal, Injecte, Preservative Fre 03/10/2014   Influenza,inj,Quad PF,6+ Mos 03/02/2013   Influenza,trivalent, recombinat, inj, PF 01/09/2018   PFIZER Comirnaty(Gray Top)Covid-19 Tri-Sucrose Vaccine 09/30/2020   PFIZER(Purple Top)SARS-COV-2 Vaccination 05/24/2019, 06/13/2019, 01/23/2020   Pneumococcal Conjugate-13 12/14/2016   Pneumococcal Polysaccharide-23 12/15/2017   Tdap 11/29/2012   Zoster Recombinat (Shingrix) 01/08/2018    Conditions to be addressed/monitored:  Hypertension, Hyperlipidemia, Diabetes, Heart Failure, and GERD  Care Plan : CCM Care Plan  Updates made by [name], RPH since 12/24/2020 12:00 AM     Problem: HTN, HF, HLD, DM2, GERD   Priority: High  Onset Date: 12/24/2020     Long-Range Goal: Disease Management   Start Date: 12/24/2020  Expected End Date: 06/26/2021  This Visit's Progress: On track  Priority: High  Note:   Current Barriers:  Unable to independently monitor therapeutic efficacy Unable to achieve control of blood pressure / heart rate   Pharmacist Clinical Goal(s):  Patient will achieve adherence to monitoring guidelines and medication adherence to achieve therapeutic efficacy achieve control of blood pressure/ heart rate as evidenced by blood pressure logs maintain control of LDL and A1c as evidenced by next lipid panel / blood sugar logs  through collaboration with PharmD and provider.   Interventions: 1:1 collaboration with Binnie Rail, MD regarding development and update of comprehensive plan of care as evidenced by provider attestation and co-signature Inter-disciplinary care team collaboration (see longitudinal plan of care) Comprehensive medication review performed; medication list updated in electronic medical record  Hyperlipidemia: (LDL goal < 70) -Controlled Lab Results  Component Value Date   LDLCALC 62 11/14/2020  -  Current treatment: Atorvastatin 20mg  - 1/2 tablet Tuesday, Thursday, and  Saturday, 1 tablet daily all other days  Aspirin 81mg  - 1 tablet daily -Medications previously tried: n/a  -Current dietary patterns: reports to frequent fried/ fast food consumption -Current exercise habits: 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, moving tables, lifting heavier items -Educated on Cholesterol goals;  Benefits of statin for ASCVD risk reduction; Importance of limiting foods high in cholesterol; Exercise goal of 150 minutes per week; -Counseled on diet and exercise extensively Recommended to continue current medication  Diabetes (A1c goal <7%) -Controlled Lab Results  Component Value Date   HGBA1C 6.4 11/14/2020  -Current medications: Sitagliptin-Metformin (Janumet) 50-1000mg  - 1 tablet daily  -Medications previously tried: farxiga  -Current home glucose readings fasting glucose: n/a - has not checked blood sugar  -Denies hypoglycemic/hyperglycemic symptoms -Current meal patterns:  breakfast: reports if he does eat, usually egg sandwich - could be [employer]  lunch: does not typically eat  dinner: reports typically may be a fried food, or wife will cook for him, protein, vegetable, starch snacks: peanuts, potato chips, cookies, bananas  drinks: diet soda, un-sweet tea -Current exercise: 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, moving tables, lifting heavier items -Educated on A1c and blood sugar goals; Complications of diabetes including kidney damage, retinal damage, and cardiovascular disease; Exercise goal of 150 minutes per week; Prevention and management of hypoglycemic episodes; Benefits of routine self-monitoring of blood sugar; -Counseled to check feet daily and get yearly eye exams -Counseled on diet and exercise extensively Recommended to continue current medication  Heart Failure (Goal: manage symptoms and prevent exacerbations)/Hypertension (BP goal <140/90) -Control - Unknown at this time -Last ejection fraction: 35-40% (Date: 04/13/2019) -HF type:  Systolic -Current treatment: Carvedilol 25mg  - 1 tablet twice daily - recently increased by cardiology  Digoxin 0.125mg  - 1 tablet daily  Last Digoxin level 0.5ng/mL (04/13/2019) Ramipril 10mg  - 1 tablet daily  Amlodipine 5mg  - 1 tablet daily  Sotalol 120mg  - 1 tablet twice daily  -Medications previously tried: n/a  -Current home BP/HR readings: unknown - patient has not been checking since increase in coreg dosing - dose have BP cuff at home -Current dietary habits: eats a low sodium diet, tea on occasion, reports no coffee consumption  -Current exercise habits: 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, moving tables, lifting heavier items  -Educated on Benefits of medications for managing symptoms and prolonging life Importance of blood pressure control -Counseled on diet and exercise extensively Recommended to continue current medication and start checking blood pressure and heart rate and recording, will discuss with patient in 4 weeks - patient to reach out should he have worsening symptoms of orthostatic hypotension  GERD (Goal: Prevention/ control of acid reflux) -Controlled -Current treatment  Omeprazole 20mg  - 1 capsule daily  Famotidine 20mg  - 1 tablet twice daily  -Medications previously tried: ranitidine  -Counseled on diet and exercise extensively Recommended to continue current medication  Health Maintenance -Vaccine gaps: Influenza Vaccine -Current therapy:  Vitamin B-12 108mcg - 1 tablet daily   Lab Results  Component Value Date   [id number] (H) 11/14/2020  -Recommended to continue current medication  Patient Goals/Self-Care Activities Patient will:  - take medications as prescribed check glucose daily to every other day, document, and provide at future appointments check blood pressure daily to every other day, document, and provide at future appointments target a minimum of 150 minutes of moderate intensity exercise weekly engage in dietary modifications by  reducing fried/  fast food consumption, increasing lean protein / fresh vegetable intake  Follow Up Plan: Telephone follow up appointment with care management team member scheduled for: 4 weeks The patient has been provided with contact information for the care management team and has been advised to call with any health related questions or concerns.         Medication Assistance: None required.  Patient affirms current coverage meets needs.   Patient's preferred pharmacy is:  [id number] - [place], [place] [place] [address] Phone: [phone] Fax: [phone]   Uses pill box? Yes - comes prepackaged from pharmacy Pt endorses 100% compliance  Care Plan and Follow Up Patient Decision:  Patient agrees to Care Plan and Follow-up.  Plan: Telephone follow up appointment with care management team member scheduled for:  1 month and The patient has been provided with contact information for the care management team and has been advised to call with any health related questions or concerns.   [name], PharmD Clinical Pharmacist, [place]

## 2020-12-24 NOTE — Patient Instructions (Addendum)
Visit Information   PATIENT GOALS:   Goals Addressed             This Visit's Progress    Monitor and Manage My Blood Sugar-Diabetes Type 2       Timeframe:  Long-Range Goal Priority:  High Start Date:   12/24/2020                        Expected End Date: 06/26/2021                      Follow Up Date 01/24/2021   - check blood sugar at prescribed times - check blood sugar before and after exercise - check blood sugar if I feel it is too high or too low - enter blood sugar readings and medication or insulin into daily log - take the blood sugar log to all doctor visits - take the blood sugar meter to all doctor visits    Why is this important?   Checking your blood sugar at home helps to keep it from getting very high or very low.  Writing the results in a diary or log helps the doctor know how to care for you.  Your blood sugar log should have the time, date and the results.  Also, write down the amount of insulin or other medicine that you take.  Other information, like what you ate, exercise done and how you were feeling, will also be helpful.       Track and Manage My Blood Pressure and Heart Rate-Hypertension / Heart Failure       Timeframe:  Long-Range Goal Priority:  High Start Date:   12/24/2020                          Expected End Date: 06/26/2021                   Follow Up Date 01/24/2021   - check blood pressure daily - every other day  - choose a place to take my blood pressure (home, clinic or office, retail store) - write blood pressure results in a log or diary    Why is this important?   You won't feel high blood pressure, but it can still hurt your blood vessels.  High blood pressure can cause heart or kidney problems. It can also cause a stroke.  Making lifestyle changes like losing a little weight or eating less salt will help.  Checking your blood pressure at home and at different times of the day can help to control blood pressure.  If the doctor  prescribes medicine remember to take it the way the doctor ordered.  Call the office if you cannot afford the medicine or if there are questions about it.           Consent to CCM Services: [name] was given information about Chronic Care Management services including:  CCM service includes personalized support from designated clinical staff supervised by his physician, including individualized plan of care and coordination with other care providers 24/7 contact phone numbers for assistance for urgent and routine care needs. Service will only be billed when office clinical staff spend 20 minutes or more in a month to coordinate care. Only one practitioner may furnish and bill the service in a calendar month. The patient may stop CCM services at any time (effective at the end of the month)  by phone call to the office staff. The patient will be responsible for cost sharing (co-pay) of up to 20% of the service fee (after annual deductible is met).  Patient agreed to services and verbal consent obtained.   Patient verbalizes understanding of instructions provided today and agrees to view in [place].   Telephone follow up appointment with care management team member scheduled for: 4 weeks The patient has been provided with contact information for the care management team and has been advised to call with any health related questions or concerns.   [name], PharmD Clinical Pharmacist, [place]    CLINICAL CARE PLAN: Patient Care Plan: CCM Care Plan     Problem Identified: HTN, HF, HLD, DM2, GERD   Priority: High  Onset Date: 12/24/2020     Long-Range Goal: Disease Management   Start Date: 12/24/2020  Expected End Date: 06/26/2021  This Visit's Progress: On track  Priority: High  Note:   Current Barriers:  Unable to independently monitor therapeutic efficacy Unable to achieve control of blood pressure / heart rate   Pharmacist Clinical Goal(s):  Patient  will achieve adherence to monitoring guidelines and medication adherence to achieve therapeutic efficacy achieve control of blood pressure/ heart rate as evidenced by blood pressure logs maintain control of LDL and A1c as evidenced by next lipid panel / blood sugar logs  through collaboration with PharmD and provider.   Interventions: 1:1 collaboration with Binnie Rail, MD regarding development and update of comprehensive plan of care as evidenced by provider attestation and co-signature Inter-disciplinary care team collaboration (see longitudinal plan of care) Comprehensive medication review performed; medication list updated in electronic medical record  Hyperlipidemia: (LDL goal < 70) -Controlled Lab Results  Component Value Date   [place] 62 11/14/2020  -Current treatment: Atorvastatin 59m - 1/2 tablet Tuesday, Thursday, and Saturday, 1 tablet daily all other days  Aspirin 858m- 1 tablet daily -Medications previously tried: n/a  -Current dietary patterns: reports to frequent fried/ fast food consumption -Current exercise habits: 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, moving tables, lifting heavier items -Educated on Cholesterol goals;  Benefits of statin for ASCVD risk reduction; Importance of limiting foods high in cholesterol; Exercise goal of 150 minutes per week; -Counseled on diet and exercise extensively Recommended to continue current medication  Diabetes (A1c goal <7%) -Controlled Lab Results  Component Value Date   HGBA1C 6.4 11/14/2020  -Current medications: Sitagliptin-Metformin (Janumet) 50-100085m 1 tablet daily  -Medications previously tried: farxiga  -Current home glucose readings fasting glucose: n/a - has not checked blood sugar  -Denies hypoglycemic/hyperglycemic symptoms -Current meal patterns:  breakfast: reports if he does eat, usually egg sandwich - could be [employer]  lunch: does not typically eat  dinner: reports typically may be a fried food, or  wife will cook for him, protein, vegetable, starch snacks: peanuts, potato chips, cookies, bananas  drinks: diet soda, un-sweet tea -Current exercise: 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, moving tables, lifting heavier items -Educated on A1c and blood sugar goals; Complications of diabetes including kidney damage, retinal damage, and cardiovascular disease; Exercise goal of 150 minutes per week; Prevention and management of hypoglycemic episodes; Benefits of routine self-monitoring of blood sugar; -Counseled to check feet daily and get yearly eye exams -Counseled on diet and exercise extensively Recommended to continue current medication  Heart Failure (Goal: manage symptoms and prevent exacerbations)/Hypertension (BP goal <140/90) -Control - Unknown at this time -Last ejection fraction: 35-40% (Date: 04/13/2019) -  HF type: Systolic -Current treatment: Carvedilol 74m - 1 tablet twice daily - recently increased by cardiology  Digoxin 0.1214m- 1 tablet daily  Last Digoxin level 0.5ng/mL (04/13/2019) Ramipril 1082m 1 tablet daily  Amlodipine 5mg17m1 tablet daily  Sotalol 120mg25m tablet twice daily  -Medications previously tried: n/a  -Current home BP/HR readings: unknown - patient has not been checking since increase in coreg dosing - dose have BP cuff at home -Current dietary habits: eats a low sodium diet, tea on occasion, reports no coffee consumption  -Current exercise habits: 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, moving tables, lifting heavier items  -Educated on Benefits of medications for managing symptoms and prolonging life Importance of blood pressure control -Counseled on diet and exercise extensively Recommended to continue current medication and start checking blood pressure and heart rate and recording, will discuss with patient in 4 weeks - patient to reach out should he have worsening symptoms of orthostatic hypotension  GERD (Goal: Prevention/ control of acid  reflux) -Controlled -Current treatment  Omeprazole 20mg 67mcapsule daily  Famotidine 20mg -65mablet twice daily  -Medications previously tried: ranitidine  -Counseled on diet and exercise extensively Recommended to continue current medication  Health Maintenance -Vaccine gaps: Influenza Vaccine -Current therapy:  Vitamin B-12 1000mcg -31mablet daily   Lab Results  Component Value Date   [id number] 87207/2022  -Recommended to continue current medication  Patient Goals/Self-Care Activities Patient will:  - take medications as prescribed check glucose daily to every other day, document, and provide at future appointments check blood pressure daily to every other day, document, and provide at future appointments target a minimum of 150 minutes of moderate intensity exercise weekly engage in dietary modifications by reducing fried/ fast food consumption, increasing lean protein / fresh vegetable intake  Follow Up Plan: Telephone follow up appointment with care management team member scheduled for: 4 weeks The patient has been provided with contact information for the care management team and has been advised to call with any health related questions or concerns.

## 2021-01-08 ENCOUNTER — Other Ambulatory Visit: Payer: Self-pay | Admitting: Internal Medicine

## 2021-01-10 NOTE — Progress Notes (Signed)
Remote ICD transmission.   

## 2021-01-14 NOTE — Progress Notes (Signed)
Subjective:    Patient ID: [name], male    DOB: 04/17/1951, 70 y.o.   MRN: [id number]  HPI The patient is here for follow up weight loss from last month - we decreased janumet back to daily, also htn.   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 Has neuropsych evaluation in Dec.   Having difficulty walking - having difficulty picking up his feet - both feet.  It started a while a go. It is taking him longer to walk short distances.   No N/T in legs/feet.   He is unsure if his legs are week. No back pain unless he sits long periods.  Neurological exam was normal when he saw neurology.  Medications and allergies reviewed with patient and updated if appropriate.  Patient Active Problem List   Diagnosis Date Noted   Mild dementia ([place]) 12/05/2020   Aortic atherosclerosis ([place]) 12/05/2020   Midline low back pain without sciatica 12/02/2020   Fatigue 12/02/2020   Weight loss 11/15/2020   Memory difficulties 08/09/2020   Chronic back pain 04/25/2019   Neuralgia 10/19/2018   Chronic left-sided thoracic back pain 08/23/2018   Right foot pain 12/15/2017   Hip pain 12/14/2016   Left knee pain 12/14/2016   Shingles 07/28/2016   GERD (gastroesophageal reflux disease) 07/29/2015   Malignant neoplasm of prostate ([place]) 09/27/2014   RLS (restless legs syndrome) 11/29/2012   Obstructive sleep apnea 11/10/2012   Elevated alkaline phosphatase level 10/04/2012   Vitamin B 12 deficiency 0000000   Chronic systolic heart failure ([place]) 01/27/2011   Mild anemia 01/20/2011   Ventricular tachycardia (H[facility])    NICM (nonischemic cardiomyopathy) ([place])    Diabetes ([place]) 08/26/2009   Hyperlipidemia 08/26/2009   Essential hypertension 08/26/2009   PROSTATE CANCER, HX OF 08/26/2009   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   Barrett's esophagus 09/05/2004    Current Outpatient Medications on File Prior to Visit  Medication Sig Dispense Refill   ACCU-CHEK GUIDE  test strip USE TO CHECK SUGAR 4 TIMES A DAY 100 strip 4   amLODipine (NORVASC) 5 MG tablet TAKE 1 TABLET BY MOUTH DAILY. FOLLOW-UP APPT DUE IN DEC MUST SEE PROVIDER FOR FUTURE REFILLS 30 tablet 2   ASPIRIN LOW DOSE 81 MG EC tablet TAKE 1 TABLET BY MOUTH EVERY DAY 30 tablet 2   atorvastatin (LIPITOR) 20 MG tablet TAKE 1 TABLET BY MOUTH DAILY EXCEPT 1/2 TABLET ON TUESDAY, THURSDAY AND SATURDAY. 22 tablet 8   carvedilol (COREG) 25 MG tablet Take 1 tablet (25 mg total) by mouth 2 (two) times daily with a meal. 180 tablet 3   digoxin (LANOXIN) 0.125 MG tablet TAKE 1 TABLET BY MOUTH EVERY DAY 30 tablet 2   famotidine (PEPCID) 20 MG tablet TAKE 1 TABLET BY MOUTH TWICE A DAY 180 tablet 1   Lancets (ONETOUCH ULTRASOFT) lancets Use as instructed to test sugar daily and prn. Dx E11.9 100 each 12   omeprazole (PRILOSEC) 20 MG capsule TAKE 1 CAPSULE BY MOUTH DAILY. RECOMMENDED TO TAKE 30 MIN BEFORE BREAKFAST. *MAX PER INSURANCE 90 capsule 1   ramipril (ALTACE) 10 MG capsule TAKE 1 CAPSULE BY MOUTH 2 TIMES DAILY. 60 capsule 5   sitaGLIPtin-metformin (JANUMET) 50-1000 MG tablet Take 1 tablet by mouth daily. Take with a meal 60 tablet 8   sotalol (BETAPACE) 120 MG tablet TAKE 1 TABLET BY MOUTH TWICE A DAY 180 tablet 1   vitamin B-12 (CYANOCOBALAMIN) 1000 MCG tablet TAKE 1 TABLET BY MOUTH  EVERY DAY 30 tablet 5   No current facility-administered medications on file prior to visit.    Past Medical History:  Diagnosis Date   AICD (automatic cardioverter/defibrillator) present    Anxiety    since defibrillator placement   Arthritis    knees   Cataract    Chronic systolic heart failure ([place])    Colon polyps    tubular adenoma   Diabetes mellitus    Dysrhythmia    GERD (gastroesophageal reflux disease)    Hx of myocardial infarction    Hyperlipidemia    Hypertension    NICM (nonischemic cardiomyopathy) ([place])    a.normal cors by cath in 2005. b. s/p Medtronic ICD.   Pacemaker    Prostate cancer [facility])  06/2003   s/p prostatectomy   S/P radiation therapy 10/10/2014 through 11/26/2014                                                      Prostate bed 6600 cGy in 33 sessions                           Sleep apnea    no CPAP use   Ventricular tachycardia ([place])    a. s/p ICD (generator change 09/2012). b. 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 and placed on sotalol    Past Surgical History:  Procedure Laterality Date   CARDIAC DEFIBRILLATOR PLACEMENT  03/11/2004   Medtronic Maximo single lead   COLONOSCOPY W/ BIOPSIES AND POLYPECTOMY  08/2004, 02/24/11   109m adenoma in 2006,  5 mm polyp (not recovered) 2012   [place] 10[place]   Procedure: CYSTOSCOPY WITH DIRECT VISION INTERNAL URETHROTOMY;  Surgeon: BNickie Retort MD;  Location: [facility] ORS;  Service: Urology;  Laterality: N/A;   IMPLANTABLE CARDIOVERTER DEFIBRILLATOR GENERATOR CHANGE N/A 10/07/2012   Procedure: IMPLANTABLE CARDIOVERTER DEFIBRILLATOR GENERATOR CHANGE;  Surgeon: GEvans Lance MD;  [facility];  Service: Cardiovascular;  Laterality: N/A;   PROSTATECTOMY  06/2003   Dr NJanice Norrie  RECTAL SURGERY     UPPER GASTROINTESTINAL ENDOSCOPY  08/2004, 02/24/11   Barrett's esophagus    Social History   Socioeconomic History   Marital status: Married    Spouse name: Not on file   Number of children: 2   Years of education: Not on file   Highest education level: Not on file  Occupational History   Occupation: BTherapist, sports [employer]  Tobacco Use   Smoking status: Never   Smokeless tobacco: Never  Vaping Use   Vaping Use: Never used  Substance and Sexual Activity   Alcohol use: No   Drug use: No   Sexual activity: Not on file  Other Topics Concern   Not on file  Social History Narrative   Right handed   No caffeine   Two story home   Social Determinants of Health   Financial Resource Strain: Low Risk    Difficulty of Paying Living Expenses: Not hard at all  Food Insecurity:  No Food Insecurity   Worried About RCharity fundraiserin the Last Year: Never true   [place] the Last Year: Never true  Transportation Needs: No Transportation Needs   Lack of Transportation (Medical): No  Lack of Transportation (Non-Medical): No  Physical Activity: Sufficiently Active   Days of Exercise per Week: 5 days   Minutes of Exercise per Session: 30 min  Stress: No Stress Concern Present   Feeling of Stress : Not at all  Social Connections: Socially Integrated   Frequency of Communication with Friends and Family: More than three times a week   Frequency of Social Gatherings with Friends and Family: More than three times a week   Attends Religious Services: More than 4 times per year   Active Member of [employer] or Organizations: Yes   Attends Music therapist: More than 4 times per year   Marital Status: Married    Family History  Problem Relation Age of Onset   Coronary artery disease Father    Prostate cancer Father    Diabetes Father    Hypertension Mother    Diabetes Paternal Grandmother    Stroke Neg Hx    Colon cancer Neg Hx    Colon polyps Neg Hx    Esophageal cancer Neg Hx    Rectal cancer Neg Hx    Stomach cancer Neg Hx     Review of Systems  Constitutional:  Negative for appetite change, chills and fever.  Respiratory:  Negative for cough, shortness of breath and wheezing.   Cardiovascular:  Positive for palpitations. Negative for chest pain and leg swelling.  Musculoskeletal:  Positive for back pain (chronic) and gait problem (poor balance).  Neurological:  Negative for light-headedness and headaches.      Objective:   Vitals:   01/15/21 0907  BP: (!) 142/82  Pulse: 66  Temp: 98.4 F (36.9 C)  SpO2: 98%   BP Readings from Last 3 Encounters:  01/15/21 (!) 142/82  12/05/20 (!) 146/85  12/03/20 (!) 146/84   Wt Readings from Last 3 Encounters:  01/15/21 199 lb (90.3 kg)  12/05/20 196 lb (88.9 kg)  12/03/20 194 lb (88  kg)   Body mass index is 29.39 kg/m.   Physical Exam    Constitutional: Appears well-developed and well-nourished. No distress.  HENT:  Head: Normocephalic and atraumatic.  Neck: Neck supple. No tracheal deviation present. No thyromegaly present.  No cervical lymphadenopathy Cardiovascular: Normal rate, regular rhythm and normal heart sounds.   No murmur heard. No carotid bruit .  No edema Pulmonary/Chest: Effort normal and breath sounds normal. No respiratory distress. No has no wheezes. No rales.  Skin: Skin is warm and dry. Not diaphoretic.  Psychiatric: Normal mood and affect. Behavior is normal.      Assessment & Plan:    Advised he needs to have imaging of his brain-initially MRI was ordered, but that will need to be canceled because he does have an ICD in place that is not compatible with an MRI.  CT scan ordered by neurology.  See Problem List for Assessment and Plan of chronic medical problems.    This visit occurred during the SARS-CoV-2 public health emergency.  Safety protocols were in place, including screening questions prior to the visit, additional usage of staff PPE, and extensive cleaning of exam room while observing appropriate contact time as indicated for disinfecting solutions.

## 2021-01-15 ENCOUNTER — Ambulatory Visit: Payer: Medicare PPO | Admitting: Internal Medicine

## 2021-01-15 ENCOUNTER — Encounter: Payer: Self-pay | Admitting: Internal Medicine

## 2021-01-15 ENCOUNTER — Other Ambulatory Visit: Payer: Self-pay

## 2021-01-15 VITALS — BP 142/82 | HR 66 | Temp 98.4°F | Ht 69.0 in | Wt 199.0 lb

## 2021-01-15 DIAGNOSIS — R634 Abnormal weight loss: Secondary | ICD-10-CM | POA: Diagnosis not present

## 2021-01-15 DIAGNOSIS — Z95810 Presence of automatic (implantable) cardiac defibrillator: Secondary | ICD-10-CM | POA: Insufficient documentation

## 2021-01-15 DIAGNOSIS — Z23 Encounter for immunization: Secondary | ICD-10-CM | POA: Diagnosis not present

## 2021-01-15 DIAGNOSIS — I700 Atherosclerosis of aorta: Secondary | ICD-10-CM

## 2021-01-15 DIAGNOSIS — E1151 Type 2 diabetes mellitus with diabetic peripheral angiopathy without gangrene: Secondary | ICD-10-CM

## 2021-01-15 DIAGNOSIS — I10 Essential (primary) hypertension: Secondary | ICD-10-CM | POA: Diagnosis not present

## 2021-01-15 LAB — COMPREHENSIVE METABOLIC PANEL
ALT: 12 U/L (ref 0–53)
AST: 12 U/L (ref 0–37)
Albumin: 3.9 g/dL (ref 3.5–5.2)
Alkaline Phosphatase: 115 U/L (ref 39–117)
BUN: 10 mg/dL (ref 6–23)
CO2: 30 mEq/L (ref 19–32)
Calcium: 9.6 mg/dL (ref 8.4–10.5)
Chloride: 106 mEq/L (ref 96–112)
Creatinine, Ser: 0.88 mg/dL (ref 0.40–1.50)
GFR: 87.64 mL/min (ref >60.00)
Glucose, Bld: 97 mg/dL (ref 70–99)
Potassium: 3.9 mEq/L (ref 3.5–5.1)
Sodium: 142 mEq/L (ref 135–145)
Total Bilirubin: 0.4 mg/dL (ref 0.2–1.2)
Total Protein: 7.1 g/dL (ref 6.0–8.3)

## 2021-01-15 LAB — CBC WITH DIFFERENTIAL/PLATELET
Basophils Absolute: 0 10*3/uL (ref 0.0–0.1)
Basophils Relative: 0.3 % (ref 0.0–3.0)
Eosinophils Absolute: 0.1 10*3/uL (ref 0.0–0.7)
Eosinophils Relative: 1.8 % (ref 0.0–5.0)
HCT: 36.1 % — ABNORMAL LOW (ref 39.0–52.0)
Hemoglobin: 11.3 g/dL — ABNORMAL LOW (ref 13.0–17.0)
Lymphocytes Relative: 23.5 % (ref 12.0–46.0)
Lymphs Abs: 1.5 10*3/uL (ref 0.7–4.0)
MCHC: 31.3 g/dL (ref 30.0–36.0)
MCV: 74.4 fl — ABNORMAL LOW (ref 78.0–100.0)
Monocytes Absolute: 0.7 10*3/uL (ref 0.1–1.0)
Monocytes Relative: 10.5 % (ref 3.0–12.0)
Neutro Abs: 4 10*3/uL (ref 1.4–7.7)
Neutrophils Relative %: 63.9 % (ref 43.0–77.0)
Platelets: 225 10*3/uL (ref 150.0–400.0)
RBC: 4.85 Mil/uL (ref 4.22–5.81)
RDW: 16.6 % — ABNORMAL HIGH (ref 11.5–15.5)
WBC: 6.3 10*3/uL (ref 4.0–10.5)

## 2021-01-15 LAB — HEMOGLOBIN A1C: Hgb A1c MFr Bld: 6.2 % (ref 4.6–6.5)

## 2021-01-15 NOTE — Assessment & Plan Note (Signed)
Subacute Lost 30 pounds April through July and now he is slowly starting to gain some weight Possibly related to increased dose of Janumet-2 tabs daily.  When we decrease this back to 1 tab daily restarted to gain weight Will monitor weight closely

## 2021-01-15 NOTE — Assessment & Plan Note (Addendum)
Chronic Continue atorvastatin 20 mg daily Lab Results  Component Value Date   LDLCALC 62 11/14/2020   LDL at goal Encouraged continuing high activity level, encouraged healthy diet

## 2021-01-15 NOTE — Assessment & Plan Note (Addendum)
Chronic Blood pressure controlled Continue amlodipine 5 mg daily, Coreg 25 mg twice daily, ramipril 10 mg twice daily CMP

## 2021-01-15 NOTE — Patient Instructions (Addendum)
    Call [facility] to schedule the MRI of your brain.     Flu immunization administered today.    Blood work was ordered.     Medications changes include :   none    Please followup in 6 months

## 2021-01-15 NOTE — Assessment & Plan Note (Signed)
Chronic Sugars have been controlled At his last visit approximately a month ago decrease Janumet back to once daily because of concern that the medication was causing weight loss Check A1c

## 2021-01-21 ENCOUNTER — Telehealth: Payer: Medicare PPO

## 2021-01-27 ENCOUNTER — Telehealth: Payer: Medicare PPO

## 2021-01-31 ENCOUNTER — Encounter: Payer: Medicare PPO | Admitting: Internal Medicine

## 2021-01-31 ENCOUNTER — Telehealth: Payer: Medicare PPO

## 2021-02-03 ENCOUNTER — Telehealth: Payer: Self-pay

## 2021-02-03 NOTE — Progress Notes (Signed)
Chronic Care Management Pharmacy Assistant   Name: [name]  MRN: [id number] DOB: 19-Jan-1951   Reason for Encounter: Disease State Hypertension     Recent office visits:  01/15/21 Binnie Rail MD - Seen for hypertension - Labs ordered -  Decrease Janumet to 1 tablet daily - Flu shot administered in office - Followup in 6 months   Recent consult visits:  None noted   Hospital visits:  None in previous 6 months  Medications: Outpatient Encounter Medications as of 02/03/2021  Medication Sig   ACCU-CHEK GUIDE test strip USE TO CHECK SUGAR 4 TIMES A DAY   amLODipine (NORVASC) 5 MG tablet TAKE 1 TABLET BY MOUTH DAILY. FOLLOW-UP APPT DUE IN DEC MUST SEE PROVIDER FOR FUTURE REFILLS   ASPIRIN LOW DOSE 81 MG EC tablet TAKE 1 TABLET BY MOUTH EVERY DAY   atorvastatin (LIPITOR) 20 MG tablet TAKE 1 TABLET BY MOUTH DAILY EXCEPT 1/2 TABLET ON TUESDAY, THURSDAY AND SATURDAY.   carvedilol (COREG) 25 MG tablet Take 1 tablet (25 mg total) by mouth 2 (two) times daily with a meal.   digoxin (LANOXIN) 0.125 MG tablet TAKE 1 TABLET BY MOUTH EVERY DAY   famotidine (PEPCID) 20 MG tablet TAKE 1 TABLET BY MOUTH TWICE A DAY   Lancets (ONETOUCH ULTRASOFT) lancets Use as instructed to test sugar daily and prn. Dx E11.9   omeprazole (PRILOSEC) 20 MG capsule TAKE 1 CAPSULE BY MOUTH DAILY. RECOMMENDED TO TAKE 30 MIN BEFORE BREAKFAST. *MAX PER INSURANCE   ramipril (ALTACE) 10 MG capsule TAKE 1 CAPSULE BY MOUTH 2 TIMES DAILY.   sitaGLIPtin-metformin (JANUMET) 50-1000 MG tablet Take 1 tablet by mouth daily. Take with a meal   sotalol (BETAPACE) 120 MG tablet TAKE 1 TABLET BY MOUTH TWICE A DAY   vitamin B-12 (CYANOCOBALAMIN) 1000 MCG tablet TAKE 1 TABLET BY MOUTH EVERY DAY   No facility-administered encounter medications on file as of 02/03/2021.    Care Gaps: FOOT EXAM Overdue since 10/16/2020   Reviewed chart prior to disease state call. Spoke with patient regarding BP  Recent Office Vitals: BP  Readings from Last 3 Encounters:  01/15/21 (!) 142/82  12/05/20 (!) 146/85  12/03/20 (!) 146/84   Pulse Readings from Last 3 Encounters:  01/15/21 66  12/05/20 77  12/03/20 81    Wt Readings from Last 3 Encounters:  01/15/21 199 lb (90.3 kg)  12/05/20 196 lb (88.9 kg)  12/03/20 194 lb (88 kg)     Kidney Function Lab Results  Component Value Date/Time   CREATININE 0.88 01/15/2021 09:48 AM   CREATININE 0.89 11/14/2020 12:04 PM   CREATININE 1.07 11/10/2012 02:50 PM   GFR 87.64 01/15/2021 09:48 AM   GFRNONAA 78 04/13/2019 11:56 AM   GFRAA 90 04/13/2019 11:56 AM    BMP Latest Ref Rng & Units 01/15/2021 11/14/2020 08/09/2020  Glucose 70 - 99 mg/dL 97 98 132(H)  BUN 6 - 23 mg/dL 10 14 12   Creatinine 0.40 - 1.50 mg/dL 0.88 0.89 1.07  BUN/Creat Ratio 10 - 24 - - -  Sodium 135 - 145 mEq/L 142 143 141  Potassium 3.5 - 5.1 mEq/L 3.9 4.2 4.0  Chloride 96 - 112 mEq/L 106 106 106  CO2 19 - 32 mEq/L 30 25 30   Calcium 8.4 - 10.5 mg/dL 9.6 9.2 9.1    Current antihypertensive regimen:  amlodipine 5 mg daily, Coreg 25 mg twice daily, ramipril 10 mg twice daily How often are you checking your Blood Pressure?  Patient states that he does not check his blood pressure at home. He does not have a blood pressure monitor at home.  Current home BP readings: N/a What recent interventions/DTPs have been made by any provider to improve Blood Pressure control since last CPP Visit: N/a Any recent hospitalizations or ED visits since last visit with CPP? No What diet changes have been made to improve Blood Pressure Control?  N/a What exercise is being done to improve your Blood Pressure Control?  Patient states that he is still working.   Adherence Review: Is the patient currently on ACE/ARB medication? Yes Does the patient have >5 day gap between last estimated fill dates? Yes  Misc. Comment: Mr. Mccard is a very pleasant man. He states that he is doing well on his medications and has not noticed any  weight loss since changing his Janumet medication to once daily. Patient states that he is still taking Ramipril, which shows as last fill on 10/08/20. Patient also mentions that his blood sugars have been doing okay, The last time he checked his blood sugar was about a week ago, but does not have any readings for me at this time.I notified him that per Dan's notes on 12/24/20, He should be checking his blood sugars daily. Patient stated understanding.    Star Rating Drugs: Atorvastatin 20 mg last fill 12/02/20 30 DS  Ramipril 10 mg last fill 10/08/20 30 DS  Janumet 50-1000 mg last fill 12/02/20 60 DS    Andee Poles, CMA

## 2021-02-04 ENCOUNTER — Ambulatory Visit: Payer: Medicare PPO | Attending: Internal Medicine

## 2021-02-04 DIAGNOSIS — Z23 Encounter for immunization: Secondary | ICD-10-CM

## 2021-02-04 NOTE — Progress Notes (Signed)
   Covid-19 Vaccination Clinic  Name:  [name]    MRN: [id number] DOB: Jun 25, 1950  02/04/2021  Mr. Whedbee was observed post Covid-19 immunization for 15 minutes without incident. He was provided with Vaccine Information Sheet and instruction to access the V-Safe system.   Mr. Enke was instructed to call 911 with any severe reactions post vaccine: Difficulty breathing  Swelling of face and throat  A fast heartbeat  A bad rash all over body  Dizziness and weakness

## 2021-02-06 NOTE — Progress Notes (Signed)
Subjective:    Patient ID: [name], male    DOB: July 03, 1950, 70 y.o.   MRN: [id number]  This visit occurred during the SARS-CoV-2 public health emergency.  Safety protocols were in place, including screening questions prior to the visit, additional usage of staff PPE, and extensive cleaning of exam room while observing appropriate contact time as indicated for disinfecting solutions.     HPI The patient is here for follow up of their chronic medical problems, including htn, DM, hld, gerd, weight loss  Doing well overall.  Appetite has returned and he is eating normally.  He feels his medications are all good and he has no complaints.  Ct head ordered - 10/10.   Sees Dr Melvyn Novas 12/9    Medications and allergies reviewed with patient and updated if appropriate.  Patient Active Problem List   Diagnosis Date Noted   Mild dementia ([place]) 12/05/2020   Aortic atherosclerosis ([employer]) 12/05/2020   Midline low back pain without sciatica 12/02/2020   Fatigue 12/02/2020   Weight loss 11/15/2020   Memory difficulties 08/09/2020   Chronic back pain 04/25/2019   Neuralgia 10/19/2018   Chronic left-sided thoracic back pain 08/23/2018   Right foot pain 12/15/2017   Hip pain 12/14/2016   Left knee pain 12/14/2016   Shingles 07/28/2016   GERD (gastroesophageal reflux disease) 07/29/2015   Malignant neoplasm of prostate ([place]) 09/27/2014   RLS (restless legs syndrome) 11/29/2012   Obstructive sleep apnea 11/10/2012   Vitamin B 12 deficiency 62/70/3500   Chronic systolic heart failure ([place]) 01/27/2011   Mild anemia 01/20/2011   Ventricular tachycardia (HCC)    NICM (nonischemic cardiomyopathy) ([place])    Diabetes ([place]) 08/26/2009   Hyperlipidemia 08/26/2009   Essential hypertension 08/26/2009   PROSTATE CANCER, HX OF 08/26/2009   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   Barrett's esophagus 09/05/2004    Current Outpatient Medications on File Prior to  Visit  Medication Sig Dispense Refill   ACCU-CHEK GUIDE test strip USE TO CHECK SUGAR 4 TIMES A DAY 100 strip 4   amLODipine (NORVASC) 5 MG tablet TAKE 1 TABLET BY MOUTH DAILY. FOLLOW-UP APPT DUE IN DEC MUST SEE PROVIDER FOR FUTURE REFILLS 30 tablet 2   ASPIRIN LOW DOSE 81 MG EC tablet TAKE 1 TABLET BY MOUTH EVERY DAY 30 tablet 2   atorvastatin (LIPITOR) 20 MG tablet TAKE 1 TABLET BY MOUTH DAILY EXCEPT 1/2 TABLET ON TUESDAY, THURSDAY AND SATURDAY. 22 tablet 8   carvedilol (COREG) 25 MG tablet Take 1 tablet (25 mg total) by mouth 2 (two) times daily with a meal. 180 tablet 3   digoxin (LANOXIN) 0.125 MG tablet TAKE 1 TABLET BY MOUTH EVERY DAY 30 tablet 2   famotidine (PEPCID) 20 MG tablet TAKE 1 TABLET BY MOUTH TWICE A DAY 180 tablet 1   Lancets (ONETOUCH ULTRASOFT) lancets Use as instructed to test sugar daily and prn. Dx E11.9 100 each 12   ramipril (ALTACE) 10 MG capsule TAKE 1 CAPSULE BY MOUTH 2 TIMES DAILY. 60 capsule 5   sitaGLIPtin-metformin (JANUMET) 50-1000 MG tablet Take 1 tablet by mouth daily. Take with a meal 60 tablet 8   vitamin B-12 (CYANOCOBALAMIN) 1000 MCG tablet TAKE 1 TABLET BY MOUTH EVERY DAY 30 tablet 5   No current facility-administered medications on file prior to visit.    Past Medical History:  Diagnosis Date   AICD (automatic cardioverter/defibrillator) present    Anxiety    since defibrillator placement  Arthritis    knees   Cataract    Chronic systolic heart failure (HCC)    Colon polyps    tubular adenoma   Diabetes mellitus    Dysrhythmia    GERD (gastroesophageal reflux disease)    Hx of myocardial infarction    Hyperlipidemia    Hypertension    NICM (nonischemic cardiomyopathy) ([place])    a.normal cors by cath in 2005. b. s/p Medtronic ICD.   Pacemaker    Prostate cancer [facility]) 06/2003   s/p prostatectomy   S/P radiation therapy 10/10/2014 through 11/26/2014                                                      Prostate bed 6600 cGy in 33 sessions                            Sleep apnea    no CPAP use   Ventricular tachycardia ([place])    a. s/p ICD (generator change 09/2012). b. 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 and placed on sotalol    Past Surgical History:  Procedure Laterality Date   CARDIAC DEFIBRILLATOR PLACEMENT  03/11/2004   Medtronic Maximo single lead   COLONOSCOPY W/ BIOPSIES AND POLYPECTOMY  08/2004, 02/24/11   74mm adenoma in 2006,  5 mm polyp (not recovered) 2012   [place] N/A 10/08/2015   Procedure: CYSTOSCOPY WITH DIRECT VISION INTERNAL URETHROTOMY;  Surgeon: Nickie Retort, MD;  Location: [facility] ORS;  Service: Urology;  Laterality: N/A;   IMPLANTABLE CARDIOVERTER DEFIBRILLATOR GENERATOR CHANGE N/A 10/07/2012   Procedure: IMPLANTABLE CARDIOVERTER DEFIBRILLATOR GENERATOR CHANGE;  Surgeon: Evans Lance, MD;  Location: [facility] CATH LAB;  Service: Cardiovascular;  Laterality: N/A;   PROSTATECTOMY  06/2003   Dr Janice Norrie   RECTAL SURGERY     UPPER GASTROINTESTINAL ENDOSCOPY  08/2004, 02/24/11   Barrett's esophagus    Social History   Socioeconomic History   Marital status: Married    Spouse name: Not on file   Number of children: 2   Years of education: Not on file   Highest education level: Not on file  Occupational History   Occupation: Therapist, sports: [employer]  Tobacco Use   Smoking status: Never   Smokeless tobacco: Never  Vaping Use   Vaping Use: Never used  Substance and Sexual Activity   Alcohol use: No   Drug use: No   Sexual activity: Not on file  Other Topics Concern   Not on file  Social History Narrative   Right handed   No caffeine   Two story home   Social Determinants of Health   Financial Resource Strain: Low Risk    Difficulty of Paying Living Expenses: Not hard at all  Food Insecurity: No Food Insecurity   Worried About Charity fundraiser in the Last Year: Never true   [place] in the Last Year: Never true  Transportation Needs: No Transportation  Needs   Lack of Transportation (Medical): No   Lack of Transportation (Non-Medical): No  Physical Activity: Sufficiently Active   Days of Exercise per Week: 5 days   Minutes of Exercise per Session: 30 min  Stress: No Stress Concern Present   Feeling of Stress : Not at  all  Social Connections: Engineer, building services of Communication with Friends and Family: More than three times a week   Frequency of Social Gatherings with Friends and Family: More than three times a week   Attends Religious Services: More than 4 times per year   Active Member of [employer] or Organizations: Yes   Attends Music therapist: More than 4 times per year   Marital Status: Married    Family History  Problem Relation Age of Onset   Coronary artery disease Father    Prostate cancer Father    Diabetes Father    Hypertension Mother    Diabetes Paternal Grandmother    Stroke Neg Hx    Colon cancer Neg Hx    Colon polyps Neg Hx    Esophageal cancer Neg Hx    Rectal cancer Neg Hx    Stomach cancer Neg Hx     Review of Systems  Constitutional:  Negative for fever.  Respiratory:  Negative for cough, shortness of breath and wheezing.   Cardiovascular:  Negative for chest pain, palpitations and leg swelling.  Neurological:  Negative for dizziness, light-headedness and headaches.      Objective:   Vitals:   02/07/21 0938  BP: 140/82  Pulse: 78  SpO2: 98%   BP Readings from Last 3 Encounters:  02/07/21 140/82  01/15/21 (!) 142/82  12/05/20 (!) 146/85   Wt Readings from Last 3 Encounters:  02/07/21 200 lb (90.7 kg)  01/15/21 199 lb (90.3 kg)  12/05/20 196 lb (88.9 kg)   Body mass index is 29.53 kg/m.   Physical Exam    Constitutional: Appears well-developed and well-nourished. No distress.  HENT:  Head: Normocephalic and atraumatic.  Neck: Neck supple. No tracheal deviation present. No thyromegaly present.  No cervical lymphadenopathy Cardiovascular: Normal rate, regular  rhythm and normal heart sounds.   No murmur heard. No carotid bruit .  No edema Pulmonary/Chest: Effort normal and breath sounds normal. No respiratory distress. No has no wheezes. No rales.  Skin: Skin is warm and dry. Not diaphoretic.  Psychiatric: Normal mood and affect. Behavior is normal.      Assessment & Plan:    See Problem List for Assessment and Plan of chronic medical problems.

## 2021-02-07 ENCOUNTER — Ambulatory Visit: Payer: Medicare PPO | Admitting: Internal Medicine

## 2021-02-07 ENCOUNTER — Other Ambulatory Visit: Payer: Self-pay

## 2021-02-07 ENCOUNTER — Encounter: Payer: Self-pay | Admitting: Internal Medicine

## 2021-02-07 ENCOUNTER — Other Ambulatory Visit: Payer: Self-pay | Admitting: Internal Medicine

## 2021-02-07 VITALS — BP 140/82 | HR 78 | Ht 69.0 in | Wt 200.0 lb

## 2021-02-07 DIAGNOSIS — I10 Essential (primary) hypertension: Secondary | ICD-10-CM | POA: Diagnosis not present

## 2021-02-07 DIAGNOSIS — E1151 Type 2 diabetes mellitus with diabetic peripheral angiopathy without gangrene: Secondary | ICD-10-CM

## 2021-02-07 DIAGNOSIS — K219 Gastro-esophageal reflux disease without esophagitis: Secondary | ICD-10-CM | POA: Diagnosis not present

## 2021-02-07 DIAGNOSIS — E7849 Other hyperlipidemia: Secondary | ICD-10-CM | POA: Diagnosis not present

## 2021-02-07 NOTE — Assessment & Plan Note (Signed)
Chronic Regular exercise and healthy diet encouraged Continue atorvastatin 20 mg daily 4 days a week and 10 mg 3 days a week

## 2021-02-07 NOTE — Assessment & Plan Note (Signed)
Chronic Lab Results  Component Value Date   HGBA1C 6.2 01/15/2021   Sugars are well controlled We just decrease the Janumet from 2 pills daily to 1 pill daily-2 pills was causing too much decreased appetite and significant weight loss.  Since going back on 1 pill daily he is doing well Discussed that he needs to be careful of his diet make sure he is active and not being back to much weight to help make sure your sugars remain well controlled Continue Janumet 50-1000 mg daily

## 2021-02-07 NOTE — Patient Instructions (Addendum)
   Medications changes include :   none    Please followup in 6 months   

## 2021-02-07 NOTE — Assessment & Plan Note (Addendum)
Chronic GERD controlled Continue omeprazole 20 mg daily, famotidine 20 mg twice daily

## 2021-02-07 NOTE — Assessment & Plan Note (Addendum)
Chronic Blood pressure reasonably controlled Continue amlodipine 5 mg daily, carvedilol 25 mg twice daily, ramipril 10 mg twice daily

## 2021-02-12 ENCOUNTER — Telehealth: Payer: Self-pay

## 2021-02-12 NOTE — Chronic Care Management (AMB) (Signed)
Chronic Care Management Pharmacy Assistant   Name: [name]  MRN: [id number] DOB: 05/03/1951  [name] is an 70 y.o. year old male who presents for his follow-up CCM visit with the clinical pharmacist.  Reason for Encounter: Disease State HTN and DM    Recent office visits:  02/07/21 Binnie Rail MD PCP- pt was seen for Essential HTN. No labs or med changes. Follow up in 6 months  Recent consult visits:  None noted  Hospital visits:  None in previous 6 months  Medications: Outpatient Encounter Medications as of 02/12/2021  Medication Sig   ACCU-CHEK GUIDE test strip USE TO CHECK SUGAR 4 TIMES A DAY   amLODipine (NORVASC) 5 MG tablet TAKE 1 TABLET BY MOUTH DAILY. FOLLOW-UP APPT DUE IN DEC MUST SEE PROVIDER FOR FUTURE REFILLS   ASPIRIN LOW DOSE 81 MG EC tablet TAKE 1 TABLET BY MOUTH EVERY DAY   atorvastatin (LIPITOR) 20 MG tablet TAKE 1 TABLET BY MOUTH DAILY EXCEPT 1/2 TABLET ON TUESDAY, THURSDAY AND SATURDAY.   carvedilol (COREG) 25 MG tablet Take 1 tablet (25 mg total) by mouth 2 (two) times daily with a meal.   digoxin (LANOXIN) 0.125 MG tablet TAKE 1 TABLET BY MOUTH EVERY DAY   famotidine (PEPCID) 20 MG tablet TAKE 1 TABLET BY MOUTH TWICE A DAY   Lancets (ONETOUCH ULTRASOFT) lancets Use as instructed to test sugar daily and prn. Dx E11.9   omeprazole (PRILOSEC) 20 MG capsule TAKE 1 CAPSULE BY MOUTH DAILY. RECOMMENDED TO TAKE 30 MIN BEFORE BREAKFAST. *MAX PER INSURANCE   ramipril (ALTACE) 10 MG capsule TAKE 1 CAPSULE BY MOUTH 2 TIMES DAILY.   sitaGLIPtin-metformin (JANUMET) 50-1000 MG tablet Take 1 tablet by mouth daily. Take with a meal   sotalol (BETAPACE) 120 MG tablet TAKE 1 TABLET BY MOUTH TWICE A DAY   vitamin B-12 (CYANOCOBALAMIN) 1000 MCG tablet TAKE 1 TABLET BY MOUTH EVERY DAY   No facility-administered encounter medications on file as of 02/12/2021.    Reviewed chart prior to disease state call. Spoke with patient regarding BP  Recent Office  Vitals: BP Readings from Last 3 Encounters:  02/07/21 140/82  01/15/21 (!) 142/82  12/05/20 (!) 146/85   Pulse Readings from Last 3 Encounters:  02/07/21 78  01/15/21 66  12/05/20 77    Wt Readings from Last 3 Encounters:  02/07/21 200 lb (90.7 kg)  01/15/21 199 lb (90.3 kg)  12/05/20 196 lb (88.9 kg)     Kidney Function Lab Results  Component Value Date/Time   CREATININE 0.88 01/15/2021 09:48 AM   CREATININE 0.89 11/14/2020 12:04 PM   CREATININE 1.07 11/10/2012 02:50 PM   GFR 87.64 01/15/2021 09:48 AM   GFRNONAA 78 04/13/2019 11:56 AM   GFRAA 90 04/13/2019 11:56 AM    BMP Latest Ref Rng & Units 01/15/2021 11/14/2020 08/09/2020  Glucose 70 - 99 mg/dL 97 98 132(H)  BUN 6 - 23 mg/dL 10 14 12   Creatinine 0.40 - 1.50 mg/dL 0.88 0.89 1.07  BUN/Creat Ratio 10 - 24 - - -  Sodium 135 - 145 mEq/L 142 143 141  Potassium 3.5 - 5.1 mEq/L 3.9 4.2 4.0  Chloride 96 - 112 mEq/L 106 106 106  CO2 19 - 32 mEq/L 30 25 30   Calcium 8.4 - 10.5 mg/dL 9.6 9.2 9.1    Current antihypertensive regimen:  amlodipine 5 mg daily,  Coreg 25 mg BID  ramipril 10 mg BID  How often are you checking your  Blood Pressure?  Pt stated he's not checking his BP because he has to find where he put his BP cuff.  **Pt stated his sugar levels are 116-120  Current home BP readings: Pt stated he's not checking his BP because he has misplaced his BP cuff but on 02/07/21 it was in the "140 range"  What recent interventions/DTPs have been made by any provider to improve Blood Pressure control since last CPP Visit: None  Any recent hospitalizations or ED visits since last visit with CPP? No  What diet changes have been made to improve Blood Pressure Control?  Pt  stated he's not cut back on his sodium intake but has increased water intake.  What exercise is being done to improve your Blood Pressure Control?  Pt stated he works so that's his workout.   Pt says he's paying for double his meds when the provider  decreased his Janumet from 2 tabs daily to 1 tab daily. Call Pharmacy?  Adherence Review: Is the patient currently on ACE/ARB medication? Yes Does the patient have >5 day gap between last estimated fill dates? No   Star Rating Drugs:  Atorvastatin 20 mg last fill 12/02/20 30 DS  Ramipril 10 mg last fill 10/08/20 30 DS  Janumet 50-1000 mg last fill 12/02/20 60 DS

## 2021-02-14 ENCOUNTER — Other Ambulatory Visit (HOSPITAL_BASED_OUTPATIENT_CLINIC_OR_DEPARTMENT_OTHER): Payer: Self-pay

## 2021-02-14 MED ORDER — COVID-19MRNA BIVAL VACC PFIZER 30 MCG/0.3ML IM SUSP
INTRAMUSCULAR | 0 refills | Status: DC
  Filled 2021-02-14: qty 0.3, 1d supply, fill #0

## 2021-02-17 ENCOUNTER — Ambulatory Visit
Admission: RE | Admit: 2021-02-17 | Discharge: 2021-02-17 | Disposition: A | Discharge status: Home / Self Care | Payer: Medicare PPO | Source: Ambulatory Visit | Attending: Physician Assistant | Admitting: Physician Assistant

## 2021-02-17 ENCOUNTER — Other Ambulatory Visit: Payer: Self-pay

## 2021-02-17 DIAGNOSIS — R413 Other amnesia: Secondary | ICD-10-CM | POA: Diagnosis not present

## 2021-03-09 ENCOUNTER — Other Ambulatory Visit: Payer: Self-pay | Admitting: Internal Medicine

## 2021-03-20 ENCOUNTER — Ambulatory Visit (INDEPENDENT_AMBULATORY_CARE_PROVIDER_SITE_OTHER): Payer: Medicare PPO

## 2021-03-20 DIAGNOSIS — I428 Other cardiomyopathies: Secondary | ICD-10-CM

## 2021-03-20 LAB — CUP PACEART REMOTE DEVICE CHECK
Battery Remaining Longevity: 44 mo
Battery Voltage: 2.97 V
Brady Statistic RV Percent Paced: 0.01 %
Date Time Interrogation Session: 20221110033624
HighPow Impedance: 55 Ohm
Implantable Lead Implant Date: 20140530
Implantable Lead Location: 753860
Implantable Lead Model: 6935
Implantable Pulse Generator Implant Date: 20140530
Lead Channel Impedance Value: 342 Ohm
Lead Channel Impedance Value: 456 Ohm
Lead Channel Pacing Threshold Amplitude: 0.625 V
Lead Channel Pacing Threshold Pulse Width: 0.4 ms
Lead Channel Sensing Intrinsic Amplitude: 5.375 mV
Lead Channel Sensing Intrinsic Amplitude: 5.375 mV
Lead Channel Setting Pacing Amplitude: 2.5 V
Lead Channel Setting Pacing Pulse Width: 0.4 ms
Lead Channel Setting Sensing Sensitivity: 0.3 mV

## 2021-03-25 ENCOUNTER — Telehealth: Payer: Medicare PPO

## 2021-03-31 NOTE — Progress Notes (Signed)
Remote ICD transmission.   

## 2021-04-08 ENCOUNTER — Other Ambulatory Visit: Payer: Self-pay | Admitting: Internal Medicine

## 2021-04-18 ENCOUNTER — Ambulatory Visit: Payer: Medicare PPO

## 2021-04-18 ENCOUNTER — Other Ambulatory Visit: Payer: Self-pay

## 2021-04-18 ENCOUNTER — Ambulatory Visit (INDEPENDENT_AMBULATORY_CARE_PROVIDER_SITE_OTHER): Payer: Medicare PPO | Admitting: Psychology

## 2021-04-18 ENCOUNTER — Encounter: Payer: Self-pay | Admitting: Psychology

## 2021-04-18 DIAGNOSIS — R4189 Other symptoms and signs involving cognitive functions and awareness: Secondary | ICD-10-CM

## 2021-04-18 DIAGNOSIS — F01A0 Vascular dementia, mild, without behavioral disturbance, psychotic disturbance, mood disturbance, and anxiety: Secondary | ICD-10-CM

## 2021-04-18 DIAGNOSIS — I499 Cardiac arrhythmia, unspecified: Secondary | ICD-10-CM | POA: Insufficient documentation

## 2021-04-18 DIAGNOSIS — F411 Generalized anxiety disorder: Secondary | ICD-10-CM | POA: Insufficient documentation

## 2021-04-18 DIAGNOSIS — I252 Old myocardial infarction: Secondary | ICD-10-CM | POA: Insufficient documentation

## 2021-04-18 DIAGNOSIS — Z8616 Personal history of COVID-19: Secondary | ICD-10-CM | POA: Insufficient documentation

## 2021-04-18 HISTORY — DX: Vascular dementia, mild, without behavioral disturbance, psychotic disturbance, mood disturbance, and anxiety: F01.A0

## 2021-04-18 NOTE — Progress Notes (Signed)
   Psychometrician Note   Cognitive testing was administered to [name] by Cruzita Lederer, B.S. (psychometrist) under the supervision of Dr. Christia Reading, Ph.D., licensed psychologist on 04/18/2021. Mr. Loiseau did not appear overtly distressed by the testing session per behavioral observation or responses across self-report questionnaires. Rest breaks were offered.    The battery of tests administered was selected by Dr. Christia Reading, Ph.D. with consideration to Mr. Ow's current level of functioning, the nature of his symptoms, emotional and behavioral responses during interview, level of literacy, observed level of motivation/effort, and the nature of the referral question. This battery was communicated to the psychometrist. Communication between Dr. Christia Reading, Ph.D. and the psychometrist was ongoing throughout the evaluation and Dr. Christia Reading, Ph.D. was immediately accessible at all times. Dr. Christia Reading, Ph.D. provided supervision to the psychometrist on the date of this service to the extent necessary to assure the quality of all services provided.    [name] will return within approximately 1-2 weeks for an interactive feedback session with Dr. Melvyn Novas at which time his test performances, clinical impressions, and treatment recommendations will be reviewed in detail. Mr. Ciano understands he can contact our office should he require our assistance before this time.  A total of 150 minutes of billable time were spent face-to-face with Mr. Reppond by the psychometrist. This includes both test administration and scoring time. Billing for these services is reflected in the clinical report generated by Dr. Christia Reading, Ph.D.  This note reflects time spent with the psychometrician and does not include test scores or any clinical interpretations made by Dr. Melvyn Novas. The full report will follow in a separate note.

## 2021-04-18 NOTE — Progress Notes (Signed)
NEUROPSYCHOLOGICAL EVALUATION [place]. [facility] Department of Neurology  Date of Evaluation: April 18, 2021  Reason for Referral:   [name] is a 70 y.o. right-handed African-American male referred by  Sharene Butters, PA-C , to characterize his current cognitive functioning and assist with diagnostic clarity and treatment planning in the context of subjective cognitive decline and numerous cardiovascular ailments.   Assessment and Plan:   Clinical Impression(s): [name]'s pattern of performance is suggestive of significant impairment surrounding executive functioning and all aspects of verbal memory. Additional weaknesses were exhibited across processing speed, complex attention (i.e., working memory), receptive language, expressive language outside of semantic fluency, and visuospatial abilities. Performance was appropriate across basic attention, safety/judgment, semantic fluency, and visual memory. [name] reported minimal ADL dysfunction and continues to work full-time. As such, given evidence for cognitive dysfunction described above, he meets criteria for a Mild Neurocognitive Disorder ("mild cognitive impairment") at the present time.  The etiology for ongoing dysfunction is somewhat unclear and potentially multifactorial in nature. Strong consideration should be given to a primary vascular etiology (i.e., mild vascular neurocognitive disorder). Medically, [name] has a very large degree of cardiovascular illness (e.g., hypertension, ventricular tachycardia, cardiomyopathy, chronic heart failure, aortic atherosclerosis, hyperlipidemia, and prior heart attack). Type II diabetes and untreated obstructive sleep apnea, while not cardiovascular conditions, will further affect cardiovascular health. While a recent brain MRI was unable to be obtained due to his pacemaker, it seems safe to assume that this would reveal notable small vessel ischemic changes.  Cognitively, his pattern of dysfunction surrounding processing speed, attention/concentration, executive functioning, verbal fluency, and encoding/retrieval aspects of memory is quite consistent with that would typically be expected with a vascular cause. Outside of this, I cannot rule out the presence of Alzheimer's disease and a "mixed dementia" presentation given consistent impairment across all verbal memory tasks. However, it is important to highlight that visual memory was strong, as was semantic fluency. While confrontation naming exhibited a normative weakness, he only missed three items on this task and this may not be a clinically significant finding. All of this would suggest that if Alzheimer's disease was present, this disease process would be in very early stages at the present time.   I do not see compelling evidence for frontotemporal dementia. He does have some REM sleep behaviors and visual disturbances. The former may be better accounted for by a history of restless leg syndrome. There is also some question regarding the fully formed nature of potential visual hallucinations. Because of this, Lewy body dementia seems less likely at the present time. However, this would change should these experiences become more prominent and other parkinsonian features emerge. Continued medical monitoring will be important moving forward.  Recommendations: A repeat neuropsychological evaluation in 18-24 months (or sooner if functional decline is noted) is recommended to assess the trajectory of future cognitive decline should it occur. This will also aid in future efforts towards improved diagnostic clarity.  Given ongoing memory impairment, he could consider discussing memory-based medications with Ms. Wertman. These are traditionally less effective when dealing with a primary vascular etiology. However, these certainly could be beneficial if there is an underlying neurodegenerative component to his  presentation in slowing down progression.   Medical records suggest a history of untreated sleep apnea. Should this condition remain untreated, it will certainly exacerbate cognitive dysfunction, especially memory loss. Untreated sleep apnea will also increase his risk for stroke, additional heart attack, and dementia. I would strongly  encourage him to utilize his CPAP machine on a nightly basis.   [name] is encouraged to attend to lifestyle factors for brain health (e.g., regular physical exercise, good nutrition habits, regular participation in cognitively-stimulating activities, and general stress management techniques), which are likely to have benefits for both emotional adjustment and cognition. Optimal control of vascular risk factors (including safe cardiovascular exercise and adherence to dietary recommendations) is encouraged. Continued participation in activities which provide mental stimulation and social interaction is also recommended.   When learning new information, he would benefit from information being broken up into small, manageable pieces. He may also find it helpful to articulate the material in his own words and in a context to promote encoding at the onset of a new task. This material may need to be repeated multiple times to promote encoding.  Memory can be improved using internal strategies such as rehearsal, repetition, chunking, mnemonics, association, and imagery. External strategies such as written notes in a consistently used memory journal, visual and nonverbal auditory cues such as a calendar on the refrigerator or appointments with alarm, such as on a cell phone, can also help maximize recall.    To address problems with processing speed, he may wish to consider:   -Ensuring that he is alerted when essential material or instructions are being presented   -Adjusting the speed at which new information is presented   -Allowing for more time in comprehending,  processing, and responding in conversation  To address problems with fluctuating attention, he may wish to consider:   -Avoiding external distractions when needing to concentrate   -Limiting exposure to fast paced environments with multiple sensory demands   -Writing down complicated information and using checklists   -Attempting and completing one task at a time (i.e., no multi-tasking)   -Verbalizing aloud each step of a task to maintain focus   -Reducing the amount of information considered at one time  Review of Records:   Mr. Desilets was seen by [facility] Sharene Butters, PA-C) on 12/05/2020 for an evaluation of memory loss. Memory concerns were said to be present for the past six months. He stated "it is not like it used to be, takes a little longer to get there." His wife reported that he has been asking the same questions more frequently. She also noted that his attention has seemingly changed (i.e., "it was always an issue, but he pays less attention than before"). 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 He denied irritability, paranoia, or hallucinations at that time. He denied depressed mood but did acknowledge "feel[ing] tired all the time." Fatigue has been exacerbated since contracting COVID-19 this past April. He denied any headaches, falls, injuries to the head, double vision, dizziness, focal numbness or tingling, unilateral weakness or tremors, constipation or diarrhea, anosmia, or a history of alcohol or tobacco use. Performance on a brief cognitive screening instrument (MOCA) was 16/30. Ultimately, Mr. Moya was referred for a comprehensive neuropsychological evaluation to characterize his cognitive abilities and to assist with diagnostic clarity and treatment planning.   Head CT on 02/18/2021 was unremarkable. He is unable to complete a brain MRI due to his implanted pacemaker.   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   Not compatible with MRI    Barrett's esophagus 09/05/2004   2 cm changes  seen at index EGD   Cataract    Chronic back pain 98/92/1194   Chronic systolic heart failure    Colon polyps    tubular adenoma   Dysrhythmia    Essential hypertension 08/26/2009   Fatigue 12/02/2020   Generalized anxiety disorder    since defibrillator placement   GERD (gastroesophageal reflux disease)    Hip pain 12/14/2016   History of COVID-19    History of myocardial infarction    Hyperlipidemia    Left knee pain 12/14/2016   Malignant neoplasm of prostate 09/27/2014   s/p prostatectomy   Mild anemia 01/20/2011   Neuralgia 10/19/2018   NICM (nonischemic cardiomyopathy)    a.normal cors by cath in 2005. b. s/p Medtronic ICD.   Obstructive sleep apnea 11/10/2012   no CPAP use   Right foot pain 12/15/2017   RLS (restless legs syndrome) 11/29/2012   S/P radiation therapy    10/10/2014 through 11/26/2014; Prostate bed 6600 cGy in 33 sessions   Shingles 07/28/2016   Type II diabetes mellitus 08/26/2009   Ventricular tachycardia    a. s/p ICD (generator change 09/2012). b. 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 and placed on sotalol   Vitamin B 12 deficiency 03/06/2011   Monthly B12 shots initiated  2011. Nasally  inhaled B12 as of 07/2012    Past Surgical History:  Procedure Laterality Date   CARDIAC DEFIBRILLATOR PLACEMENT  03/11/2004   Medtronic Maximo single lead   COLONOSCOPY W/ BIOPSIES AND POLYPECTOMY  08/2004, 02/24/11   24mm adenoma in 2006,  5 mm polyp (not recovered) 2012   [place] INTERNAL URETHROTOMY N/A 10/08/2015   Procedure: CYSTOSCOPY WITH DIRECT VISION INTERNAL URETHROTOMY;  Surgeon: Nickie Retort, MD;  Location: [facility];  Service: Urology;  Laterality: N/A;   IMPLANTABLE CARDIOVERTER DEFIBRILLATOR GENERATOR CHANGE N/A 10/07/2012   Procedure:  IMPLANTABLE CARDIOVERTER DEFIBRILLATOR GENERATOR CHANGE;  Surgeon: Evans Lance, MD;  Location: [facility] CATH LAB;  Service: Cardiovascular;  Laterality: N/A;   PROSTATECTOMY  06/2003   Dr Janice Norrie   RECTAL SURGERY     UPPER GASTROINTESTINAL ENDOSCOPY  08/2004, 02/24/11   Barrett's esophagus    Current Outpatient Medications:    ACCU-CHEK GUIDE test strip, USE TO CHECK SUGAR 4 TIMES A DAY, Disp: 100 strip, Rfl: 4   amLODipine (NORVASC) 5 MG tablet, TAKE 1 TABLET BY MOUTH DAILY. FOLLOW-UP APPT DUE IN DEC MUST SEE PROVIDER FOR FUTURE REFILLS, Disp: 30 tablet, Rfl: 2   ASPIRIN LOW DOSE 81 MG EC tablet, TAKE 1 TABLET BY MOUTH EVERY DAY, Disp: 30 tablet, Rfl: 2   atorvastatin (LIPITOR) 20 MG tablet, TAKE 1 TABLET BY MOUTH DAILY EXCEPT 1/2 TABLET ON TUESDAY, THURSDAY AND SATURDAY., Disp: 22 tablet, Rfl: 8   carvedilol (COREG) 25 MG tablet, Take 1 tablet (25 mg total) by mouth 2 (two) times daily with a meal., Disp: 180 tablet, Rfl: 3   COVID-19 mRNA bivalent vaccine, Pfizer, injection, Inject into the muscle., Disp: 0.3 mL, Rfl: 0   digoxin (LANOXIN) 0.125 MG tablet, TAKE 1 TABLET BY MOUTH EVERY DAY, Disp: 30 tablet, Rfl: 2   famotidine (PEPCID) 20 MG tablet, TAKE 1 TABLET BY MOUTH TWICE A DAY, Disp: 60 tablet, Rfl: 5   Lancets (ONETOUCH ULTRASOFT) lancets, Use as instructed to test sugar daily and prn. Dx E11.9, Disp: 100 each, Rfl: 12   omeprazole (PRILOSEC) 20 MG capsule, TAKE 1 CAPSULE BY MOUTH DAILY. RECOMMENDED TO TAKE 30 MIN BEFORE BREAKFAST. *MAX PER INSURANCE, Disp: 30 capsule,  Rfl: 5   ramipril (ALTACE) 10 MG capsule, TAKE 1 CAPSULE BY MOUTH TWICE A DAY, Disp: 60 capsule, Rfl: 5   sitaGLIPtin-metformin (JANUMET) 50-1000 MG tablet, Take 1 tablet by mouth daily. Take with a meal, Disp: 60 tablet, Rfl: 8   sotalol (BETAPACE) 120 MG tablet, TAKE 1 TABLET BY MOUTH TWICE A DAY, Disp: 60 tablet, Rfl: 8   vitamin B-12 (CYANOCOBALAMIN) 1000 MCG tablet, TAKE 1 TABLET BY MOUTH EVERY DAY, Disp: 30 tablet, Rfl:  5  Clinical Interview:   The following information was obtained during a clinical interview with Mr. Acebo and his wife prior to cognitive testing.  Cognitive Symptoms: Decreased short-term memory: Endorsed. Mr. Emberton described primary difficulties surrounding losing his train of thought and having trouble recalling names and other words he wishes to use on the spot. His wife added that he has been more repetitive when asking questions despite recently being told the answer. While he estimated that memory concerns had been present for the past 3-4 months, his wife stated that they had been progressively worsening over the past 9-12 months.  Decreased long-term memory: Denied. Decreased attention/concentration: Denied. His wife reported more concerns surrounding a diminished ability to stay focused and pay attention. However, she acknowledged being unsure if this was unintentional or if he was simply distracted by something else in his environment.  Reduced processing speed: Denied. Difficulties with executive functions: Denied. He did report some increased indecisiveness due to diminished confidence in decision making abilities. They denied trouble with impulsivity or any significant personality changes.  Difficulties with emotion regulation: Denied. Difficulties with receptive language: Denied. Difficulties with word finding: Endorsed. Decreased visuoperceptual ability: Denied.  Difficulties completing ADLs: Largely denied. His wife organizes his medications and he did report instances where he may forget to take a medication. He denied trouble with financial management, bill paying, or driving. He also continues to work full-time.   Additional Medical History: History of traumatic brain injury/concussion: Denied. History of stroke: Denied. History of seizure activity: Denied. History of known exposure to toxins: Denied. Symptoms of chronic pain: Endorsed. He reported chronic lower back  pain. Medical records also suggest prior reports of hip, knee, and foot pain.  Experience of frequent headaches/migraines: Denied. Frequent instances of dizziness/vertigo: Endorsed "sometimes." These were generally said to be present while standing quickly. His wife noted that he had commented feeling dizzy somewhat recently when entering a department store. He stated that this was due to feeling overwhelmed by the crowds.   Sensory changes: He wears glasses with benefit. Other sensory changes/difficulties (i.e., hearing, taste, or smell) were denied.  Balance/coordination difficulties: Endorsed. He reported having "some" balance concerns, noting that he will occasionally feel unsteady on his feet. One side of the body was not said to be worse than the other and he denied any recent falls. He was somewhat vague when describing these difficulties, noting that they had been present for "a good while."  Other motor difficulties: Denied.  Sleep History: Estimated hours obtained each night: 6-8 hours.  Difficulties falling asleep: Endorsed occasionally.  Difficulties staying asleep: Denied. Feels rested and refreshed upon awakening: Endorsed for the most part.   History of snoring: Endorsed. His wife noted that these symptoms had seemed to improve over recent years.  History of waking up gasping for air: Denied. Witnessed breath cessation while asleep: Denied. However, medical records suggest a history of obstructive sleep apnea, noting that he has not been utilizing his CPAP machine for an extended period of  time.   History of vivid dreaming: Endorsed. Excessive movement while asleep: Endorsed. His wife did report infrequently being hit/kicked while asleep. Mr. Haran attributed this to vivid dreams. Medical records also suggest a history of restless leg syndrome.  Instances of acting out his dreams: Denied.  Psychiatric/Behavioral Health History: Depression: Denied. He described his current mood  as "good" and denied to his knowledge any prior mental health concerns or diagnoses. Current or remote suicidal ideation, intent, or plan was denied.  Anxiety: Denied. However, medical records do suggest a history of generalized anxiety disorder, noting that anxiety symptoms seem elevated since he contracted COVID-19 this past April. He also alluded to potential anxiety symptoms when being in larger crowds.  Mania: Denied. Trauma History: Denied. Visual/auditory hallucinations: Unclear. Mr. Cisek was fairly vague with details. He did state that over the past several months, he has had frequent visual disturbances. One provided example surrounded him looking at a fire hydrant but seeing a dog. While these sometimes may represent vivid dream content, he did remark that this has been happening "a lot," even during the daytime when not associated with sleep.  Delusional thoughts: Denied.  Tobacco: Denied. Alcohol: He denied current alcohol consumption as well as a history of problematic alcohol abuse or dependence.  Recreational drugs: Denied.  Family History: Problem Relation Age of Onset   Dementia Mother    Hypertension Mother    Coronary artery disease Father    Prostate cancer Father    Diabetes Father    Dementia Sister    Dementia Brother    Diabetes Paternal Grandmother    Dementia Maternal Aunt    Stroke Neg Hx    Colon cancer Neg Hx    Colon polyps Neg Hx    Esophageal cancer Neg Hx    Rectal cancer Neg Hx    Stomach cancer Neg Hx    This information was confirmed by Mr. Rallo.  Academic/Vocational History: Highest level of educational attainment: 16 years. He graduated from high school and earned a Dietitian in Industrial/product designer. He described himself as a good (mostly B) student in academic settings. English and writing composition were said to represent likely relative weaknesses in earlier academic settings.  History of developmental delay: Denied. History of grade  repetition: Denied. Enrollment in special education courses: Denied. History of LD/ADHD: Denied.  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 He denied any colleagues expressing concern surrounding job performance or cognitive difficulties surrounding his work.   Evaluation Results:   Behavioral Observations: Mr. Lofaro was accompanied by his wife, arrived to his appointment on time, and was appropriately dressed and groomed. He appeared alert and oriented. Observed gait and station were within normal limits. Gross motor functioning appeared intact upon informal observation and no abnormal movements (e.g., tremors) were noted. His affect was generally relaxed and positive. Spontaneous speech was fluent and word finding difficulties were not observed during the clinical interview. Thought processes were coherent, organized, and normal in content. Insight into his cognitive difficulties appeared somewhat limited in that objective dysfunction was a bit more widespread and often more severe than what he reported during interview.  During testing, the psychometrist noted that he did not seem very confident in his performances. Sustained attention was appropriate. Task engagement was adequate and he persisted when challenged. He required extensive instruction repetition and clarification across more complex tasks (TMT B, D-KEFS Color Word, Similarities). Outside of this, no comprehension difficulties were noted. Overall, Mr. Maahs was  cooperative with the clinical interview and subsequent testing procedures.   Adequacy of Effort: The validity of neuropsychological testing is limited by the extent to which the individual being tested may be assumed to have exerted adequate effort during testing. Mr. Reich expressed his intention to perform to the best of his abilities and exhibited adequate task engagement and persistence. Scores across stand-alone and embedded performance  validity measures were variable but generally within expectation. One below expectation performance neared clinical cut-offs and likely reflects true cognitive impairment rather than poor engagement or attempts to perform poorly. As such, the results of the current evaluation are believed to be a valid representation of Mr. Dubuc's current cognitive functioning.  Test Results: Mr. Yonke was largely oriented at the time of the current evaluation. One point was lost for him being unable to name the current clinic.   Intellectual abilities based upon educational and vocational attainment were estimated to be in the average range. Premorbid abilities were estimated to be within the below average range based upon a single-word reading test.   Processing speed was exceptionally low to below average. Basic attention was average. More complex attention (e.g., working memory) was exceptionally low. Executive functioning was exceptionally low to well below average. Performance was average on a task assessing safety and judgment.  Assessed receptive language abilities were well below average. Points were lost on this task when asked conceptual questions, as well as when required to remember and follow multi-step commands. Assessed expressive language was variable. While both phonemic fluency and confrontation naming were well below average, semantic fluency was average. It should be highlighted that despite a lower normative score on a confrontation naming task, he only missed three items, suggesting that this normative weakness might not reflect a true functional weakness.     Assessed visuospatial/visuoconstructional abilities were exceptionally low to below average. Points were lost on his copy of a complex figure largely due to very poor planning/organization. Some mild distortions were also evident.    Learning (i.e., encoding) of novel information was average across a visual task but exceptionally low to  well below average across all verbal tasks. Spontaneous delayed recall (i.e., retrieval) of previously learned information was commensurate with performances across learning trials. Retention rates were 100% across a story learning task, 0% across a list learning task, and 75% across a shape learning task. Performance across recognition tasks was exceptionally low to well below average, suggesting limited evidence for information consolidation.   Results of emotional screening instruments suggested that recent symptoms of generalized anxiety were in the minimal range, while symptoms of depression were within normal limits. A screening instrument assessing recent sleep quality suggested the presence of minimal sleep dysfunction.  Tables of Scores:   Note: This summary of test scores accompanies the interpretive report and should not be considered in isolation without reference to the appropriate sections in the text. Descriptors are based on appropriate normative data and may be adjusted based on clinical judgment. Terms such as "Within Normal Limits" and "Outside Normal Limits" are used when a more specific description of the test score cannot be determined.       Percentile - Normative Descriptor > 98 - Exceptionally High 91-97 - Well Above Average 75-90 - Above Average 25-74 - Average 9-24 - Below Average 2-8 - Well Below Average < 2 - Exceptionally Low       Validity:   DESCRIPTOR       ACS Word Choice: --- --- Outside Normal Limits  Dot Counting Test: --- --- Within Normal Limits  NAB EVI: --- --- Within Normal Limits       Orientation:      Raw Score Percentile   NAB Orientation, Form 1 28/29 --- ---       Cognitive Screening:      Raw Score Percentile   SLUMS: 15/30 --- ---       Intellectual Functioning:      Standard Score Percentile   Test of Premorbid Functioning: 82 12 Below Average       Memory:     NAB Memory Module, Form 1: Standard Score/ T Score Percentile    Total Memory Index 62 1 Exceptionally Low  List Learning       Total Trials 1-3 12/36 (28) 2 Well Below Average    List B 4/12 (47) 38 Average    Short Delay Free Recall 1/12 (19) <1 Exceptionally Low    Long Delay Free Recall 0/12 (21) <1 Exceptionally Low    Retention Percentage 0 (15) <1 Exceptionally Low    Recognition Discriminability 2 (34) 5 Well Below Average  Shape Learning       Total Trials 1-3 18/27 (56) 73 Average    Delayed Recall 6/9 (51) 54 Average    Retention Percentage 75 (43) 25 Average    Recognition Discriminability 3 (32) 4 Well Below Average  Story Learning       Immediate Recall 31/80 (26) 1 Exceptionally Low    Delayed Recall 16/40 (30) 2 Well Below Average    Retention Percentage 100 (55) 69 Average  Daily Living Memory       Immediate Recall 16/51 (19) <1 Exceptionally Low    Delayed Recall 3/17 (19) <1 Exceptionally Low    Retention Percentage 38 (16) <1 Exceptionally Low    Recognition Hits 5/10 (20) <1 Exceptionally Low       Attention/Executive Function:     Trail Making Test (TMT): Raw Score (T Score) Percentile     Part A 98 secs.,  3 errors (22) <1 Exceptionally Low    Part B Discontinued --- Impaired         Scaled Score Percentile   WAIS-IV Coding: 4 2 Well Below Average       NAB Attention Module, Form 1: T Score Percentile     Digits Forward 44 27 Average    Digits Backwards 25 1 Exceptionally Low        Scaled Score Percentile   WAIS-IV Similarities: 4 2 Well Below Average       D-KEFS Color-Word Interference Test: Raw Score (Scaled Score) Percentile     Color Naming 45 secs. (5) 5 Well Below Average    Word Reading 30 secs. (7) 16 Below Average    Inhibition 123 secs. (1) <1 Exceptionally Low      Total Errors 18 errors (1) <1 Exceptionally Low    Inhibition/Switching 140 secs. (1) <1 Exceptionally Low      Total Errors 18 errors (1) <1 Exceptionally Low       NAB Executive Functions Module, Form 1: T Score Percentile      Judgment 52 58 Average       Language:     Verbal Fluency Test: Raw Score (T Score) Percentile     Phonemic Fluency (FAS) 23 (36) 8 Well Below Average    Animal Fluency 18 (50) 50 Average        NAB Language Module, Form 1: T Score Percentile  Auditory Comprehension 32 4 Well Below Average    Naming 28/31 (33) 5 Well Below Average       Visuospatial/Visuoconstruction:      Raw Score Percentile   Clock Drawing: 9/10 --- Within Normal Limits       NAB Spatial Module, Form 1: T Score Percentile     Figure Drawing Copy 39 14 Below Average        Scaled Score Percentile   WAIS-IV Block Design: 3 1 Exceptionally Low       Mood and Personality:      Raw Score Percentile   Beck Depression Inventory - II: 5 --- Within Normal Limits  PROMIS Anxiety Questionnaire: 15 --- None to Slight       Additional Questionnaires:      Raw Score Percentile   PROMIS Sleep Disturbance Questionnaire: 10 --- None to Slight   Informed Consent and Coding/Compliance:   The current evaluation represents a clinical evaluation for the purposes previously outlined by the referral source and is in no way reflective of a forensic evaluation.   Mr. Mounsey was provided with a verbal description of the nature and purpose of the present neuropsychological evaluation. Also reviewed were the foreseeable risks and/or discomforts and benefits of the procedure, limits of confidentiality, and mandatory reporting requirements of this provider. The patient was given the opportunity to ask questions and receive answers about the evaluation. Oral consent to participate was provided by the patient.   This evaluation was conducted by Christia Reading, Ph.D., ABPP-CN, board certified clinical neuropsychologist. Mr. Ransford completed a clinical interview with Dr. Melvyn Novas, billed as one unit [phone], and 150 minutes of cognitive testing and scoring, billed as one unit [phone] and four additional units 96139. Psychometrist Cruzita Lederer,  B.S., assisted Dr. Melvyn Novas with test administration and scoring procedures. As a separate and discrete service, Dr. Melvyn Novas spent a total of 160 minutes in interpretation and report writing billed as one unit [phone] and two units 96133.

## 2021-04-25 ENCOUNTER — Ambulatory Visit (INDEPENDENT_AMBULATORY_CARE_PROVIDER_SITE_OTHER): Payer: Medicare PPO | Admitting: Psychology

## 2021-04-25 ENCOUNTER — Other Ambulatory Visit: Payer: Self-pay

## 2021-04-25 DIAGNOSIS — F01A0 Vascular dementia, mild, without behavioral disturbance, psychotic disturbance, mood disturbance, and anxiety: Secondary | ICD-10-CM

## 2021-04-25 NOTE — Progress Notes (Signed)
° °  Neuropsychology Feedback Session Tillie Rung. [facility]  Reason for Referral:   [name] is a 70 y.o. right-handed African-American male referred by  Sharene Butters, PA-C , to characterize his current cognitive functioning and assist with diagnostic clarity and treatment planning in the context of subjective cognitive decline and numerous cardiovascular ailments.   Feedback:   [name] completed a comprehensive neuropsychological evaluation on 04/18/2021. Please refer to that encounter for the full report and recommendations. Briefly, results suggested significant impairment surrounding executive functioning and all aspects of verbal memory. Additional weaknesses were exhibited across processing speed, complex attention (i.e., working memory), receptive language, expressive language outside of semantic fluency, and visuospatial abilities. The etiology for ongoing dysfunction is somewhat unclear and potentially multifactorial in nature. Strong consideration should be given to a primary vascular etiology (i.e., mild vascular neurocognitive disorder). Medically, [name] has a very large degree of cardiovascular illness (e.g., hypertension, ventricular tachycardia, cardiomyopathy, chronic heart failure, aortic atherosclerosis, hyperlipidemia, and prior heart attack). Type II diabetes and untreated obstructive sleep apnea, while not cardiovascular conditions, will further affect cardiovascular health. Outside of this, I cannot rule out the presence of Alzheimer's disease and a "mixed dementia" presentation given consistent impairment across all verbal memory tasks. He does have some REM sleep behaviors and visual disturbances. The former may be better accounted for by a history of restless leg syndrome. There is also some question regarding the fully formed nature of potential visual hallucinations. Because of this, Lewy body dementia seems less likely at the  present time. However, this would change should these experiences become more prominent and other parkinsonian features emerge. Continued medical monitoring will be important moving forward.  Mr. Ramus was accompanied by his wife, as well as his daughter via speakerphone. Content of the current session focused on the results of his neuropsychological evaluation. [name] was given the opportunity to ask questions and his questions were answered. He was encouraged to reach out should additional questions arise. A copy of his report was provided at the conclusion of the visit.      30 minutes were spent conducting the current feedback session with [name], billed as one unit [phone].

## 2021-05-01 DIAGNOSIS — Z20822 Contact with and (suspected) exposure to covid-19: Secondary | ICD-10-CM | POA: Diagnosis not present

## 2021-05-02 ENCOUNTER — Encounter: Payer: Self-pay | Admitting: Internal Medicine

## 2021-05-02 ENCOUNTER — Telehealth: Payer: Self-pay | Admitting: Internal Medicine

## 2021-05-02 DIAGNOSIS — B019 Varicella without complication: Secondary | ICD-10-CM | POA: Diagnosis not present

## 2021-05-02 NOTE — Telephone Encounter (Signed)
Patient calling in  Having a shingles flare up  Please call [phone]

## 2021-05-02 NOTE — Progress Notes (Signed)
Assessment/Plan:   Mild Vascular Neurocognitive Disorder, likely vascular  The etiology for ongoing dysfunction is somewhat unclear and potentially multifactorial in nature. Strong consideration should be given to a primary vascular etiology (i.e., mild vascular neurocognitive disorder.. There could be an underlying AD or LBD . Will monitor for symptoms emerging or he has notable decline over time.    Recommendations:  Discussed safety both in and out of the home.  Discussed the importance of regular daily schedule with inclusion of crossword puzzles to maintain brain function.  Continue to monitor mood by PCP Stay active at least 30 minutes at least 3 times a week.  Naps should be scheduled and should be no longer than 60 minutes and should not occur after 2 PM.  Mediterranean diet is recommended  [place] closely cardiovascular risk factors Start Donepezil 10 mg :Take half tablet (5 mg) daily for 2 weeks, then increase to the full tablet at 10 mg daily. Side effects discussed   Referral to pulmonology for sleep study for evaluation of OSA-CPAP placement Repeat Neuropsych evaluation in 18-24 months  Follow up in 6 months.   Case discussed with Dr. Delice Lesch who agrees with the plan     Subjective:    [name] is a very pleasant 70 y.o. RH male with a history of hypertension, ventricular tachycardia s/p MI , cardiomyopathy, chronic heart failure, aortic atherosclerosis, hyperlipidemia, Type II diabetes and untreated obstructive sleep apnea, B12 deficiency, COVID-19 in April 2022, and a new diagnosis of mild vascular neurocognitive disorder per neuropsychological evaluation, seen today in follow up for memory loss. This patient is accompanied in the office by his wife who supplements the history.  Previous records as well as any outside records available were reviewed prior to todays visit.  Patient was last seen at our office on 12/05/2020 at which time MoCA was 16/30.  CT of the  head on 02/17/2021 (due to pacemaker placement) was without acute findings or abnormalities.  The patient is not currently on antidementia medications.  Since his last visit, he reports that the memory is "about the same ".  He denies repeating the same stories, but his wife states that he does asked the same questions "he asked me several times who about that 56 puppy for Christmas and he did not remember that he already asked that before ".  He denies any confusion around the house, or leaving objects in unusual places.  "I do not drive much anymore, I stay out of the highway ".  He denies driving to unknown places.  His mood is stable, denies any depression or irritability.  He does not sleep well, as his job interferes with it.  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  He does report vivid dreams.  Recently, he watched a basketball game, and then he feels that he watched the basketball game again until waking up realizing that was a dream.  He felt all the emotions related to the game.  He is not sure about REM behavior.  He denies sleepwalking.  He does report hallucinations.  He feels that during the day and in the night, he may see people near him, but when he turns around there is nobody there.  Usually there are people unknown to him.  He denies auditory hallucinations.  He denies paranoia.  There are no reported hygiene concerns, he is independent of bathing and dressing.  He is wife manages them, but he uses a pill pack.  He is still in charge of the finances.  His appetite is intermittent, but "overall good ".  He admits to not drinking enough water due to urinary incontinence, resulting in some dizziness when dehydrated.  He denies any vertigo, double vision, unilateral weakness, focal numbness or headaches.  He denies trouble swallowing.  He does not cook.  He ambulates slowly, feeling tired during the day, denies any falls or head  injuries.  He denies any constipation or diarrhea.  Initial evaluation 11/27/2020 denies headaches, double vision, dizziness, focal numbness or tingling, unilateral weakness, tremors or anosmia. No history of seizures. Denies urine incontinence, retention, constipation or diarrhea.  The patient is seen in neurologic consultation at the request of Biagio Borg, MD for the evaluation of memory.  The patient is accompanied by wife [name] who supplements the history. He is a 70 y.o. year old male who has had memory issues for about 6 months "it is not like it used to be, takes a little longer to get there"  His wife reports that he has been asking the same questions more frequently "When are we going to visit xyz? Or When are we going to [facility]" etc. He does not repeat the same stories though. 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 His wife also says that his attention has changed "it was always an issue, but he pays less attention than before".  He denies irritability.  No paranoia or hallucinations. He denies being depressed, but he does "feel tired all the time". His appetite is decreased and has lost 30 lbs since April, this is being followed by PCP. "I come back from work sometimes at midnight and I don't feel like eating". He denies taking any Ensure or similar food supplements.  He admits to not doing any activities that stimulate him, or any other physical activities outside of work.  "Since COVID in April of this year, and feels still very tired ".  He sleeps well, and denies sleepwalking or vivid dreams.  He is independent of dressing and bathing.  He takes his medications and does not forget to take any doses.  He manages his own finances without missing any payments.  Denies leaving objects in unusual places.  The patient does not cook.  He denies leaving the faucet on when taking a shower.  He ambulates without difficulty without the use of a walker or  a cane.  At times, he is so tired that "stumbles ".  The patient drives independently, without getting lost.  He does not use a GPS.  He denies any headaches, falls, injuries to the head, double vision, dizziness, focal numbness or tingling, unilateral weakness or tremors.  He has chronic incontinence, denies retention.  He denies any constipation or diarrhea.  Denies anosmia.  Denies a history of alcohol, or tobacco.  Family history is significant for mother, sister, and maternal aunt all had dementia.  Brother is alive, but he lives in an ALF with dementia.  He was born in [place], he has college education, graduated from [employer] in Social Studies   Pertinent labs 11/14/2020 A1c 6.4 B12 938 TSH 1.89 Lipid panel normal Sed rate normal at 18, proBNP 19 normal, vitamin D34.68, normal, CMP unremarkable, hepatic function panel with alkaline phosphatase 126, stable from prior.  PSA 0, urinalysis stable without changes from prior, CBC remarkable for hemoglobin of 11,  low MCV 71.8, hematocrit 34.4, normal platelets. CT scan of the chest abdomen and pelvis are pending. QT/QTC 362/420    Neuropsychological evaluation Dr. Melvyn Novas  04/18/21 Mr. Delatte's pattern of performance is suggestive of significant impairment surrounding executive functioning and all aspects of verbal memory. Additional weaknesses were exhibited across processing speed, complex attention (i.e., working memory), receptive language, expressive language outside of semantic fluency, and visuospatial abilities. Performance was appropriate across basic attention, safety/judgment, semantic fluency, and visual memory. Mr. Roupp reported minimal ADL dysfunction and continues to work full-time. As such, given evidence for cognitive dysfunction described above, he meets criteria for a Mild Neurocognitive Disorder ("mild cognitive impairment") at the present time.   The etiology for ongoing dysfunction is somewhat unclear and potentially multifactorial in nature.  Strong consideration should be given to a primary vascular etiology (i.e., mild vascular neurocognitive disorder). Medically, Mr. Gartin has a very large degree of cardiovascular illness (e.g., hypertension, ventricular tachycardia, cardiomyopathy, chronic heart failure, aortic atherosclerosis, hyperlipidemia, and prior heart attack). Type II diabetes and untreated obstructive sleep apnea, while not cardiovascular conditions, will further affect cardiovascular health. While a recent brain MRI was unable to be obtained due to his pacemaker, it seems safe to assume that this would reveal notable small vessel ischemic changes. Cognitively, his pattern of dysfunction surrounding processing speed, attention/concentration, executive functioning, verbal fluency, and encoding/retrieval aspects of memory is quite consistent with that would typically be expected with a vascular cause. Outside of this, I cannot rule out the presence of Alzheimer's disease and a "mixed dementia" presentation given consistent impairment across all verbal memory tasks. However, it is important to highlight that visual memory was strong, as was semantic fluency. While confrontation naming exhibited a normative weakness, he only missed three items on this task and this may not be a clinically significant finding. All of this would suggest that if Alzheimer's disease was present, this disease process would be in very early stages at the present time.    I do not see compelling evidence for frontotemporal dementia. He does have some REM sleep behaviors and visual disturbances. The former may be better accounted for by a history of restless leg syndrome. There is also some question regarding the fully formed nature of potential visual hallucinations. Because of this, Lewy body dementia seems less likely at the present time. However, this would change should these experiences become more prominent and other parkinsonian features emerge. Continued  medical monitoring will be important moving forward"  PREVIOUS MEDICATIONS: none  CURRENT MEDICATIONS:  Outpatient Encounter Medications as of 05/06/2021  Medication Sig   ACCU-CHEK GUIDE test strip USE TO CHECK SUGAR 4 TIMES A DAY   amLODipine (NORVASC) 5 MG tablet TAKE 1 TABLET BY MOUTH DAILY. FOLLOW-UP APPT DUE IN DEC MUST SEE PROVIDER FOR FUTURE REFILLS   ASPIRIN LOW DOSE 81 MG EC tablet TAKE 1 TABLET BY MOUTH EVERY DAY   atorvastatin (LIPITOR) 20 MG tablet TAKE 1 TABLET BY MOUTH DAILY EXCEPT 1/2 TABLET ON TUESDAY, THURSDAY AND SATURDAY.   carvedilol (COREG) 25 MG tablet Take 1 tablet (25 mg total) by mouth 2 (two) times daily with a meal.   COVID-19 mRNA bivalent vaccine, Pfizer, injection Inject into the muscle.   digoxin (LANOXIN) 0.125 MG tablet TAKE 1 TABLET BY MOUTH EVERY DAY   donepezil (ARICEPT) 10 MG tablet Take half tablet (5 mg) daily for 2 weeks, then increase to the full tablet at 10 mg daily   famotidine (PEPCID) 20 MG tablet TAKE 1  TABLET BY MOUTH TWICE A DAY   Lancets (ONETOUCH ULTRASOFT) lancets Use as instructed to test sugar daily and prn. Dx E11.9   omeprazole (PRILOSEC) 20 MG capsule TAKE 1 CAPSULE BY MOUTH DAILY. RECOMMENDED TO TAKE 30 MIN BEFORE BREAKFAST. *MAX PER INSURANCE   ramipril (ALTACE) 10 MG capsule TAKE 1 CAPSULE BY MOUTH TWICE A DAY   sitaGLIPtin-metformin (JANUMET) 50-1000 MG tablet Take 1 tablet by mouth daily. Take with a meal   sotalol (BETAPACE) 120 MG tablet TAKE 1 TABLET BY MOUTH TWICE A DAY   vitamin B-12 (CYANOCOBALAMIN) 1000 MCG tablet TAKE 1 TABLET BY MOUTH EVERY DAY   No facility-administered encounter medications on file as of 05/06/2021.     Objective:     PHYSICAL EXAMINATION:    VITALS:   Vitals:   05/06/21 0727  BP: (!) 145/78  Pulse: 66  Resp: 18  SpO2: 100%  Weight: 201 lb (91.2 kg)  Height: 5\' 9"  (1.753 m)    GEN:  The patient appears stated age and is in NAD.  Flat affect HEENT:  Normocephalic, atraumatic.    Neurological examination:  General: NAD, well-groomed, appears stated age. Orientation: The patient is alert. Oriented to person, place and date Cranial nerves: There is good facial symmetry.The speech is fluent and clear. No aphasia or dysarthria. Fund of knowledge is appropriate. Recent and remote memory are impaired. Attention and concentration are reduced.  Able to name objects and repeat phrases.  Hearing is intact to conversational tone.    Sensation: Sensation is intact to light touch throughout Motor: Strength is at least antigravity x4. Tremors: none  DTR's 2/4 in [place] [facility] Assessment  12/05/2020  Visuospatial/ Executive (0/5) 3  Naming (0/3) 2  Attention: Read list of digits (0/2) 2  Attention: Read list of letters (0/1) 1  Attention: Serial 7 subtraction starting at 100 (0/3) 1  Language: Repeat phrase (0/2) 0  Language : Fluency (0/1) 0  Abstraction (0/2) 0  Delayed Recall (0/5) 1  Orientation (0/6) 6  Total 16   No flowsheet data found.  No flowsheet data found.     Movement examination: Tone: There is normal tone in the UE/LE Abnormal movements:  no tremor.  No myoclonus.  No asterixis.   Coordination:  There is no decremation with RAM's. Normal finger to nose  Gait and Station: The patient has no difficulty arising out of a deep-seated chair without the use of the hands. The patient's stride length is good but slow.  Gait is cautious and narrow.        Total time spent on today's visit was 30 minutes, including both face-to-face time and nonface-to-face time. Time included that spent on review of records (prior notes available to me/labs/imaging if pertinent), discussing treatment and goals, answering patient's questions and coordinating care.  Cc:  Binnie Rail, MD Sharene Butters, PA-C

## 2021-05-06 ENCOUNTER — Encounter: Payer: Self-pay | Admitting: Physician Assistant

## 2021-05-06 ENCOUNTER — Other Ambulatory Visit: Payer: Self-pay

## 2021-05-06 ENCOUNTER — Ambulatory Visit (INDEPENDENT_AMBULATORY_CARE_PROVIDER_SITE_OTHER): Payer: Medicare PPO | Admitting: Physician Assistant

## 2021-05-06 VITALS — BP 145/78 | HR 66 | Resp 18 | Ht 69.0 in | Wt 201.0 lb

## 2021-05-06 DIAGNOSIS — F01A0 Vascular dementia, mild, without behavioral disturbance, psychotic disturbance, mood disturbance, and anxiety: Secondary | ICD-10-CM | POA: Diagnosis not present

## 2021-05-06 DIAGNOSIS — G4709 Other insomnia: Secondary | ICD-10-CM | POA: Diagnosis not present

## 2021-05-06 DIAGNOSIS — G4730 Sleep apnea, unspecified: Secondary | ICD-10-CM | POA: Diagnosis not present

## 2021-05-06 MED ORDER — DONEPEZIL HCL 10 MG PO TABS: ORAL_TABLET | ORAL | 3 refills | Status: DC

## 2021-05-06 NOTE — Telephone Encounter (Signed)
Apptmnt set up for 12/29 at 3:40 with Dr. Mitchel Honour.

## 2021-05-06 NOTE — Patient Instructions (Signed)
It was a pleasure to see you today at our office.   Recommendations:  Follow up in  6 months We will start donepezil half tablet (5mg ) daily for 2  weeks.  If you are tolerating the medication, then after 2 weeks, we will increase the dose to a full tablet of 10 mg daily.  Side effects include nausea, vomiting, diarrhea, vivid dreams, and muscle cramps.  Sleep study for sleep apnea /CPAP    RECOMMENDATIONS FOR ALL PATIENTS WITH MEMORY PROBLEMS: 1. Continue to exercise (Recommend 30 minutes of walking everyday, or 3 hours every week) 2. Increase social interactions - continue going to [place] and enjoy social gatherings with friends and family 3. Eat healthy, avoid fried foods and eat more fruits and vegetables 4. Maintain adequate blood pressure, blood sugar, and blood cholesterol level. Reducing the risk of stroke and cardiovascular disease also helps promoting better memory. 5. Avoid stressful situations. Live a simple life and avoid aggravations. Organize your time and prepare for the next day in anticipation. 6. Sleep well, avoid any interruptions of sleep and avoid any distractions in the bedroom that may interfere with adequate sleep quality 7. Avoid sugar, avoid sweets as there is a strong link between excessive sugar intake, diabetes, and cognitive impairment We discussed the Mediterranean diet, which has been shown to help patients reduce the risk of progressive memory disorders and reduces cardiovascular risk. This includes eating fish, eat fruits and green leafy vegetables, nuts like almonds and hazelnuts, walnuts, and also use olive oil. Avoid fast foods and fried foods as much as possible. Avoid sweets and sugar as sugar use has been linked to worsening of memory function.  There is always a concern of gradual progression of memory problems. If this is the case, then we may need to adjust level of care according to patient needs. Support, both to the patient and caregiver, should then  be put into place.    FALL PRECAUTIONS: Be cautious when walking. Scan the area for obstacles that may increase the risk of trips and falls. When getting up in the mornings, sit up at the edge of the bed for a few minutes before getting out of bed. Consider elevating the bed at the head end to avoid drop of blood pressure when getting up. Walk always in a well-lit room (use night lights in the walls). Avoid area rugs or power cords from appliances in the middle of the walkways. Use a walker or a cane if necessary and consider physical therapy for balance exercise. Get your eyesight checked regularly.  FINANCIAL OVERSIGHT: Supervision, especially oversight when making financial decisions or transactions is also recommended.  HOME SAFETY: Consider the safety of the kitchen when operating appliances like stoves, microwave oven, and blender. Consider having supervision and share cooking responsibilities until no longer able to participate in those. Accidents with firearms and other hazards in the house should be identified and addressed as well.   ABILITY TO BE LEFT ALONE: If patient is unable to contact 911 operator, consider using LifeLine, or when the need is there, arrange for someone to stay with patients. Smoking is a fire hazard, consider supervision or cessation. Risk of wandering should be assessed by caregiver and if detected at any point, supervision and safe proof recommendations should be instituted.  MEDICATION SUPERVISION: Inability to self-administer medication needs to be constantly addressed. Implement a mechanism to ensure safe administration of the medications.   DRIVING: Regarding driving, in patients with progressive memory problems, driving will  be impaired. We advise to have someone else do the driving if trouble finding directions or if minor accidents are reported. Independent driving assessment is available to determine safety of driving.   If you are interested in the  driving assessment, you can contact the following:  [facility] [employer] in [place]  [place] [phone]  [place]  [facility] [phone] or [phone]

## 2021-05-08 ENCOUNTER — Ambulatory Visit: Payer: Medicare PPO | Admitting: Emergency Medicine

## 2021-05-09 ENCOUNTER — Encounter: Payer: Self-pay | Admitting: Internal Medicine

## 2021-05-12 NOTE — Progress Notes (Signed)
Subjective:    Patient ID: [name], male    DOB: [birth year], 71 y.o.   MRN: [id number]  This visit occurred during the SARS-CoV-2 public health emergency.  Safety protocols were in place, including screening questions prior to the visit, additional usage of staff PPE, and extensive cleaning of exam room while observing appropriate contact time as indicated for disinfecting solutions.    HPI The patient is here for an acute visit.  Intermittent burning sensation in left shoulder - ? Related to shingles. - seen at urgent care - finished medication.  He went to [facility] on 12/23 for left shoulder pain - felt like shingles ( nerve pain and tingling), but did not have a rash. He was prescribed valtrex 1000 mg tid x 7 days.   Symptoms started beginning of dec.  Pain in left posterior upper back then moved down-just below scapula.  Slight achy pain.  No rash.  Felt similar to last episode of shingles. Little tingling.  No itching.  Maybe more fatigued.  Getting up and down may make it worse.  Sometimes sitting in a certain position with pressure in the area helps.  He denies any muscle spasms.  He denies any movement with his arm making the pain worse.  He wondered about taking more Valtrex to see if that would help more-it may have helped a little.  His pain is intermittent and he rates it at a 6-11/2008.  He is not taking anything over-the-counter for it.     Medications and allergies reviewed with patient and updated if appropriate.  Patient Active Problem List   Diagnosis Date Noted   Generalized anxiety disorder 04/18/2021   Dysrhythmia 04/18/2021   History of myocardial infarction 04/18/2021   Mild vascular neurocognitive disorder 04/18/2021   History of COVID-19    Aortic atherosclerosis 12/05/2020   Fatigue 12/02/2020   Chronic back pain 04/25/2019   Neuralgia 10/19/2018   Shingles 07/28/2016   GERD (gastroesophageal reflux disease) 07/29/2015   Malignant  neoplasm of prostate ([place]) 09/27/2014   RLS (restless legs syndrome) 11/29/2012   Obstructive sleep apnea 11/10/2012   Vitamin B 12 deficiency 75/64/3329   Chronic systolic heart failure 51/88/4166   Mild anemia 01/20/2011   Ventricular tachycardia    NICM (nonischemic cardiomyopathy) ([place])    Type II diabetes mellitus 08/26/2009   Hyperlipidemia 08/26/2009   Essential hypertension 08/26/2009   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   Barrett's esophagus 09/05/2004    Current Outpatient Medications on File Prior to Visit  Medication Sig Dispense Refill   ACCU-CHEK GUIDE test strip USE TO CHECK SUGAR 4 TIMES A DAY 100 strip 4   amLODipine (NORVASC) 5 MG tablet TAKE 1 TABLET BY MOUTH DAILY. FOLLOW-UP APPT DUE IN DEC MUST SEE PROVIDER FOR FUTURE REFILLS 30 tablet 2   ASPIRIN LOW DOSE 81 MG EC tablet TAKE 1 TABLET BY MOUTH EVERY DAY 30 tablet 2   atorvastatin (LIPITOR) 20 MG tablet TAKE 1 TABLET BY MOUTH DAILY EXCEPT 1/2 TABLET ON TUESDAY, THURSDAY AND SATURDAY. 22 tablet 8   carvedilol (COREG) 25 MG tablet Take 1 tablet (25 mg total) by mouth 2 (two) times daily with a meal. 180 tablet 3   digoxin (LANOXIN) 0.125 MG tablet TAKE 1 TABLET BY MOUTH EVERY DAY 30 tablet 2   donepezil (ARICEPT) 10 MG tablet Take half tablet (5 mg) daily for 2 weeks, then increase to the full tablet at 10 mg daily 30 tablet 3  famotidine (PEPCID) 20 MG tablet TAKE 1 TABLET BY MOUTH TWICE A DAY 60 tablet 5   Lancets (ONETOUCH ULTRASOFT) lancets Use as instructed to test sugar daily and prn. Dx E11.9 100 each 12   omeprazole (PRILOSEC) 20 MG capsule TAKE 1 CAPSULE BY MOUTH DAILY. RECOMMENDED TO TAKE 30 MIN BEFORE BREAKFAST. *MAX PER INSURANCE 30 capsule 5   ramipril (ALTACE) 10 MG capsule TAKE 1 CAPSULE BY MOUTH TWICE A DAY 60 capsule 5   sotalol (BETAPACE) 120 MG tablet TAKE 1 TABLET BY MOUTH TWICE A DAY 60 tablet 8   vitamin B-12 (CYANOCOBALAMIN) 1000 MCG tablet TAKE 1 TABLET BY MOUTH  EVERY DAY 30 tablet 5   No current facility-administered medications on file prior to visit.    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   Not compatible with MRI    Barrett's esophagus 09/05/2004   2 cm changes seen at index EGD   Cataract    Chronic back pain 96/22/2979   Chronic systolic heart failure    Colon polyps    tubular adenoma   Dysrhythmia    Essential hypertension 08/26/2009   Fatigue 12/02/2020   Generalized anxiety disorder    since defibrillator placement   GERD (gastroesophageal reflux disease)    Hip pain 12/14/2016   History of COVID-19    History of myocardial infarction    Hyperlipidemia    Left knee pain 12/14/2016   Malignant neoplasm of prostate 09/27/2014   s/p prostatectomy   Mild anemia 01/20/2011   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 b. s/p Medtronic ICD.   Obstructive sleep apnea 11/10/2012   no CPAP use   Right foot pain 12/15/2017   RLS (restless legs syndrome) 11/29/2012   S/P radiation therapy    10/10/2014 through 11/26/2014; Prostate bed 6600 cGy in 33 sessions   Shingles 07/28/2016   Type II diabetes mellitus 08/26/2009   Ventricular tachycardia    a. s/p ICD (generator change 09/2012). b. 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 and placed on sotalol   Vitamin B 12 deficiency 03/06/2011   Monthly B12 shots initiated  2011. Nasally  inhaled B12 as of 07/2012    Past Surgical History:  Procedure Laterality Date   CARDIAC DEFIBRILLATOR PLACEMENT  03/11/2004   Medtronic Maximo single lead   COLONOSCOPY W/ BIOPSIES AND POLYPECTOMY  08/2004, 02/24/11   21mm adenoma in 2006,  5 mm polyp (not recovered) 2012   [place] INTERNAL URETHROTOMY N/A 10/08/2015   Procedure: CYSTOSCOPY WITH DIRECT VISION INTERNAL  URETHROTOMY;  Surgeon: Nickie Retort, MD;  Location: [facility];  Service: Urology;  Laterality: N/A;   IMPLANTABLE CARDIOVERTER DEFIBRILLATOR GENERATOR CHANGE N/A 10/07/2012   Procedure: IMPLANTABLE CARDIOVERTER DEFIBRILLATOR GENERATOR CHANGE;  Surgeon: Evans Lance, MD;  Location: [facility] CATH LAB;  Service: Cardiovascular;  Laterality: N/A;   PROSTATECTOMY  06/2003   Dr Janice Norrie   RECTAL SURGERY     UPPER GASTROINTESTINAL ENDOSCOPY  08/2004, 02/24/11   Barrett's esophagus    Social History   Socioeconomic History   Marital status: Married    Spouse name: Not on file   Number of children: 2   Years of education: 16   Highest education level: Bachelor's degree (e.g., BA, AB, BS)  Occupational History   Occupation: Public house manager  Manager    Employer: [employer]  Tobacco Use   Smoking status: Never   Smokeless tobacco: Never  Vaping Use   Vaping Use: Never used  Substance and Sexual Activity   Alcohol use: No   Drug use: No   Sexual activity: Not on file  Other Topics Concern   Not on file  Social History Narrative   Right handed   No caffeine   Two story home   Social Determinants of Health   Financial Resource Strain: Low Risk    Difficulty of Paying Living Expenses: Not hard at all  Food Insecurity: No Food Insecurity   Worried About Charity fundraiser in the Last Year: Never true   Ran Out of Food in the Last Year: Never true  Transportation Needs: No Transportation Needs   Lack of Transportation (Medical): No   Lack of Transportation (Non-Medical): No  Physical Activity: Sufficiently Active   Days of Exercise per Week: 5 days   Minutes of Exercise per Session: 30 min  Stress: No Stress Concern Present   Feeling of Stress : Not at all  Social Connections: Socially Integrated   Frequency of Communication with Friends and Family: More than three times a week   Frequency of Social Gatherings with Friends and Family: More than three times a week   Attends Religious Services: More  than 4 times per year   Active Member of [employer] or Organizations: Yes   Attends Music therapist: More than 4 times per year   Marital Status: Married    Family History  Problem Relation Age of Onset   Dementia Mother    Hypertension Mother    Coronary artery disease Father    Prostate cancer Father    Diabetes Father    Dementia Sister    Dementia Brother    Diabetes Paternal Grandmother    Dementia Maternal Aunt    Stroke Neg Hx    Colon cancer Neg Hx    Colon polyps Neg Hx    Esophageal cancer Neg Hx    Rectal cancer Neg Hx    Stomach cancer Neg Hx     Review of Systems  Constitutional:  Negative for fever.  Respiratory:  Negative for cough, shortness of breath and wheezing.   Skin:  Negative for color change and rash.      Objective:   Vitals:   05/14/21 0949  BP: 120/72  Pulse: 68  Temp: 97.6 F (36.4 C)  SpO2: 98%   BP Readings from Last 3 Encounters:  05/14/21 120/72  05/06/21 (!) 145/78  02/07/21 140/82   Wt Readings from Last 3 Encounters:  05/14/21 194 lb (88 kg)  05/06/21 201 lb (91.2 kg)  02/07/21 200 lb (90.7 kg)   Body mass index is 28.65 kg/m.   Physical Exam Constitutional:      General: He is not in acute distress.    Appearance: Normal appearance. He is not ill-appearing.  HENT:     Head: Normocephalic and atraumatic.  Musculoskeletal:     Comments: No tenderness with palpation along thoracic spine.  Mild tenderness with palpation just medial-inferior to left scapula-when I read the area he states it feels good.  No increased pain with arm movements  Skin:    General: Skin is warm and dry.     Findings: No erythema, lesion or rash.  Neurological:     Mental Status: He is alert.  Assessment & Plan:    See Problem List for Assessment and Plan of chronic medical problems.

## 2021-05-14 ENCOUNTER — Encounter: Payer: Self-pay | Admitting: Internal Medicine

## 2021-05-14 ENCOUNTER — Ambulatory Visit: Payer: Medicare PPO | Admitting: Internal Medicine

## 2021-05-14 ENCOUNTER — Encounter: Payer: Self-pay | Admitting: Physician Assistant

## 2021-05-14 ENCOUNTER — Other Ambulatory Visit: Payer: Self-pay

## 2021-05-14 DIAGNOSIS — K219 Gastro-esophageal reflux disease without esophagitis: Secondary | ICD-10-CM | POA: Diagnosis not present

## 2021-05-14 DIAGNOSIS — M549 Dorsalgia, unspecified: Secondary | ICD-10-CM | POA: Diagnosis not present

## 2021-05-14 DIAGNOSIS — M545 Low back pain, unspecified: Secondary | ICD-10-CM

## 2021-05-14 HISTORY — DX: Low back pain, unspecified: M54.50

## 2021-05-14 MED ORDER — MELOXICAM 7.5 MG PO TABS: 7.5000 mg | ORAL_TABLET | Freq: Every day | ORAL | 0 refills | Status: DC

## 2021-05-14 MED ORDER — JANUMET 50-1000 MG PO TABS: 1.0000 | ORAL_TABLET | Freq: Every day | ORAL | 8 refills | Status: DC

## 2021-05-14 NOTE — Patient Instructions (Addendum)
° ° °  Send a mychart message to Dr Carlean Purl asking if he feels you can come off the omeprazole.  Let him know your heartburn is controlled.  He will review your endoscopy that showed Barrett's esophagus.      Medications changes include :   meloxicam 7.5 mg daily with food x 2 weeks  Your prescription(s) have been submitted to your pharmacy. Please take as directed and contact our office if you believe you are having problem(s) with the medication(s).

## 2021-05-14 NOTE — Assessment & Plan Note (Signed)
Chronic States GERD is well controlled Has history of Barrett's esophagus-last EGD 2021 Him and his wife wonder if he can stop the omeprazole because of his recent diagnosis of mild vascular neurocognitive disorder and concern with that medication affecting his memory Advised sending Dr. Carlean Purl a note to see what his thoughts are since he does have Barrett's-stressed keeping GERD well-controlled

## 2021-05-14 NOTE — Assessment & Plan Note (Signed)
Acute Started beginning in December without obvious cause-no new activities or injuries He thought it felt similar to a previous episode of shingles-there was no rash.  He states the first time he had it there is no rash either-I question whether he ever had shingles or not Took Valtrex and still having intermittent pain 6-7/10 Pain sounds more musculoskeletal in nature than shingles Discussed symptomatic treatment for pain-we will do meloxicam 7.5 mg daily with food for 2 weeks only Can apply topical arthritis/muscle medications Can try heat He will let me know if there is no improvement we can evaluate further

## 2021-05-16 ENCOUNTER — Encounter: Payer: Self-pay | Admitting: Internal Medicine

## 2021-05-19 ENCOUNTER — Encounter: Payer: Self-pay | Admitting: Internal Medicine

## 2021-05-19 MED ORDER — FAMOTIDINE 20 MG PO TABS: 20.0000 mg | ORAL_TABLET | Freq: Every day | ORAL | 5 refills | Status: DC

## 2021-05-20 ENCOUNTER — Ambulatory Visit: Payer: Medicare PPO | Admitting: Internal Medicine

## 2021-05-29 NOTE — Progress Notes (Signed)
Subjective:    Patient ID: [name], male    DOB: 12-Nov-1950, 71 y.o.   MRN: [id number]  This visit occurred during the SARS-CoV-2 public health emergency.  Safety protocols were in place, including screening questions prior to the visit, additional usage of staff PPE, and extensive cleaning of exam room while observing appropriate contact time as indicated for disinfecting solutions.    HPI The patient is here for an acute visit.  Back pain - burning sensation moving side to side  - worse.  Taking the meloxicam. Initially pain was thought to be related to shingles - had nerve pain but no rash.    No improvement with meloxicam  The pain is a burning and achy sensation.  It is in the left upper back and in the right side that sometimes he feels in the right chest.  Once he gets in the right position it eases up.  Movement makes it worse.   Also states some constipation.  Last BM yesterday - small amount.  He denies any injuries.   Medications and allergies reviewed with patient and updated if appropriate.  Patient Active Problem List   Diagnosis Date Noted   Upper back pain on left side 05/14/2021   Generalized anxiety disorder 04/18/2021   Dysrhythmia 04/18/2021   History of myocardial infarction 04/18/2021   Mild vascular neurocognitive disorder 04/18/2021   History of COVID-19    Aortic atherosclerosis 12/05/2020   Fatigue 12/02/2020   Neuralgia 10/19/2018   Shingles 07/28/2016   GERD (gastroesophageal reflux disease) 07/29/2015   Malignant neoplasm of prostate ([place]) 09/27/2014   RLS (restless legs syndrome) 11/29/2012   Obstructive sleep apnea 11/10/2012   Vitamin B 12 deficiency 40/98/1191   Chronic systolic heart failure 47/82/9562   Mild anemia 01/20/2011   Ventricular tachycardia    NICM (nonischemic cardiomyopathy) ([place])    Type II diabetes mellitus 08/26/2009   Hyperlipidemia 08/26/2009   Essential hypertension 08/26/2009   Automatic implantable  cardioverter-defibrillator in situ 08/26/2009   Barrett's esophagus 09/05/2004    Current Outpatient Medications on File Prior to Visit  Medication Sig Dispense Refill   ACCU-CHEK GUIDE test strip USE TO CHECK SUGAR 4 TIMES A DAY 100 strip 4   amLODipine (NORVASC) 5 MG tablet TAKE 1 TABLET BY MOUTH DAILY. FOLLOW-UP APPT DUE IN DEC MUST SEE PROVIDER FOR FUTURE REFILLS 30 tablet 2   ASPIRIN LOW DOSE 81 MG EC tablet TAKE 1 TABLET BY MOUTH EVERY DAY 30 tablet 2   atorvastatin (LIPITOR) 20 MG tablet TAKE 1 TABLET BY MOUTH DAILY EXCEPT 1/2 TABLET ON TUESDAY, THURSDAY AND SATURDAY. 22 tablet 8   carvedilol (COREG) 25 MG tablet Take 1 tablet (25 mg total) by mouth 2 (two) times daily with a meal. 180 tablet 3   digoxin (LANOXIN) 0.125 MG tablet TAKE 1 TABLET BY MOUTH EVERY DAY 30 tablet 2   donepezil (ARICEPT) 10 MG tablet Take half tablet (5 mg) daily for 2 weeks, then increase to the full tablet at 10 mg daily 30 tablet 3   famotidine (PEPCID) 20 MG tablet Take 1 tablet (20 mg total) by mouth daily. 30 tablet 5   Lancets (ONETOUCH ULTRASOFT) lancets Use as instructed to test sugar daily and prn. Dx E11.9 100 each 12   omeprazole (PRILOSEC) 20 MG capsule TAKE 1 CAPSULE BY MOUTH DAILY. RECOMMENDED TO TAKE 30 MIN BEFORE BREAKFAST. *MAX PER INSURANCE 30 capsule 5   ramipril (ALTACE) 10 MG capsule TAKE 1 CAPSULE BY  MOUTH TWICE A DAY 60 capsule 5   sitaGLIPtin-metformin (JANUMET) 50-1000 MG tablet Take 1 tablet by mouth daily. Take with a meal 30 tablet 8   sotalol (BETAPACE) 120 MG tablet TAKE 1 TABLET BY MOUTH TWICE A DAY 60 tablet 8   vitamin B-12 (CYANOCOBALAMIN) 1000 MCG tablet TAKE 1 TABLET BY MOUTH EVERY DAY 30 tablet 5   No current facility-administered medications on file prior to visit.    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   Not compatible with MRI     Barrett's esophagus 09/05/2004   2 cm changes seen at index EGD   Cataract    Chronic back pain 44/05/270   Chronic systolic heart failure    Colon polyps    tubular adenoma   Dysrhythmia    Essential hypertension 08/26/2009   Fatigue 12/02/2020   Generalized anxiety disorder    since defibrillator placement   GERD (gastroesophageal reflux disease)    Hip pain 12/14/2016   History of COVID-19    History of myocardial infarction    Hyperlipidemia    Left knee pain 12/14/2016   Malignant neoplasm of prostate 09/27/2014   s/p prostatectomy   Mild anemia 01/20/2011   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 b. s/p Medtronic ICD.   Obstructive sleep apnea 11/10/2012   no CPAP use   Right foot pain 12/15/2017   RLS (restless legs syndrome) 11/29/2012   S/P radiation therapy    10/10/2014 through 11/26/2014; Prostate bed 6600 cGy in 33 sessions   Shingles 07/28/2016   Type II diabetes mellitus 08/26/2009   Ventricular tachycardia    a. s/p ICD (generator change 09/2012). b. 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 and placed on sotalol   Vitamin B 12 deficiency 03/06/2011   Monthly B12 shots initiated  2011. Nasally  inhaled B12 as of 07/2012    Past Surgical History:  Procedure Laterality Date   CARDIAC DEFIBRILLATOR PLACEMENT  03/11/2004   Medtronic Maximo single lead   COLONOSCOPY W/ BIOPSIES AND POLYPECTOMY  08/2004, 02/24/11   81mm adenoma in 2006,  5 mm polyp (not recovered) 2012   [place] INTERNAL URETHROTOMY N/A 10/08/2015   Procedure: CYSTOSCOPY WITH DIRECT VISION INTERNAL URETHROTOMY;  Surgeon: Nickie Retort, MD;  Location: [facility];  Service: Urology;  Laterality: N/A;   IMPLANTABLE CARDIOVERTER DEFIBRILLATOR GENERATOR CHANGE N/A 10/07/2012   Procedure: IMPLANTABLE CARDIOVERTER DEFIBRILLATOR GENERATOR CHANGE;  Surgeon: Evans Lance, MD;  Location: [facility] CATH LAB;   Service: Cardiovascular;  Laterality: N/A;   PROSTATECTOMY  06/2003   Dr Janice Norrie   RECTAL SURGERY     UPPER GASTROINTESTINAL ENDOSCOPY  08/2004, 02/24/11   Barrett's esophagus    Social History   Socioeconomic History   Marital status: Married    Spouse name: Not on file   Number of children: 2   Years of education: 16   Highest education level: Bachelor's degree (e.g., BA, AB, BS)  Occupational History   Occupation: Therapist, sports: [employer]  Tobacco Use   Smoking status: Never   Smokeless tobacco: Never  Vaping Use   Vaping Use: Never used  Substance and Sexual Activity   Alcohol use: No   Drug use: No   Sexual activity: Not on file  Other Topics  Concern   Not on file  Social History Narrative   Right handed   No caffeine   Two story home   Social Determinants of Health   Financial Resource Strain: Low Risk    Difficulty of Paying Living Expenses: Not hard at all  Food Insecurity: No Food Insecurity   Worried About Charity fundraiser in the Last Year: Never true   Arboriculturist in the Last Year: Never true  Transportation Needs: No Transportation Needs   Lack of Transportation (Medical): No   Lack of Transportation (Non-Medical): No  Physical Activity: Sufficiently Active   Days of Exercise per Week: 5 days   Minutes of Exercise per Session: 30 min  Stress: No Stress Concern Present   Feeling of Stress : Not at all  Social Connections: Socially Integrated   Frequency of Communication with Friends and Family: More than three times a week   Frequency of Social Gatherings with Friends and Family: More than three times a week   Attends Religious Services: More than 4 times per year   Active Member of [employer] or Organizations: Yes   Attends Music therapist: More than 4 times per year   Marital Status: Married    Family History  Problem Relation Age of Onset   Dementia Mother    Hypertension Mother    Coronary artery disease Father     Prostate cancer Father    Diabetes Father    Dementia Sister    Dementia Brother    Diabetes Paternal Grandmother    Dementia Maternal Aunt    Stroke Neg Hx    Colon cancer Neg Hx    Colon polyps Neg Hx    Esophageal cancer Neg Hx    Rectal cancer Neg Hx    Stomach cancer Neg Hx     Review of Systems  Constitutional:  Negative for fever.  Respiratory:  Negative for cough, shortness of breath and wheezing.   Cardiovascular:  Positive for chest pain (from the back). Negative for palpitations.  Gastrointestinal:  Negative for abdominal pain.  Musculoskeletal:  Positive for back pain.  Neurological:  Negative for numbness.      Objective:   Vitals:   05/30/21 1429  BP: (!) 148/72  Pulse: 65  Temp: 98 F (36.7 C)  SpO2: 98%   BP Readings from Last 3 Encounters:  05/30/21 (!) 148/72  05/14/21 120/72  05/06/21 (!) 145/78   Wt Readings from Last 3 Encounters:  05/30/21 197 lb (89.4 kg)  05/14/21 194 lb (88 kg)  05/06/21 201 lb (91.2 kg)   Body mass index is 29.09 kg/m.   Physical Exam Constitutional:      General: He is not in acute distress.    Appearance: Normal appearance. He is not ill-appearing.  HENT:     Head: Normocephalic and atraumatic.  Musculoskeletal:     Comments: Pain with movement.  He cannot get comfortable if he stays in the same position especially with putting pressure on the back.  Minimal pain with palpation of the areas where he has the pain.  No spine tenderness-thoracic or lumbar.  No chest wall pain  Skin:    General: Skin is warm and dry.     Findings: No erythema or rash.  Neurological:     Mental Status: He is alert.     Sensory: No sensory deficit.     Motor: No weakness.  Assessment & Plan:    See Problem List for Assessment and Plan of chronic medical problems.

## 2021-05-30 ENCOUNTER — Other Ambulatory Visit: Payer: Self-pay

## 2021-05-30 ENCOUNTER — Ambulatory Visit: Payer: Medicare PPO | Admitting: Internal Medicine

## 2021-05-30 ENCOUNTER — Ambulatory Visit (INDEPENDENT_AMBULATORY_CARE_PROVIDER_SITE_OTHER): Payer: Medicare PPO

## 2021-05-30 ENCOUNTER — Encounter: Payer: Self-pay | Admitting: Internal Medicine

## 2021-05-30 VITALS — BP 148/72 | HR 65 | Temp 98.0°F | Ht 69.0 in | Wt 197.0 lb

## 2021-05-30 DIAGNOSIS — M549 Dorsalgia, unspecified: Secondary | ICD-10-CM

## 2021-05-30 DIAGNOSIS — R079 Chest pain, unspecified: Secondary | ICD-10-CM | POA: Diagnosis not present

## 2021-05-30 DIAGNOSIS — M2578 Osteophyte, vertebrae: Secondary | ICD-10-CM | POA: Diagnosis not present

## 2021-05-30 DIAGNOSIS — M546 Pain in thoracic spine: Secondary | ICD-10-CM | POA: Diagnosis not present

## 2021-05-30 MED ORDER — GABAPENTIN 100 MG PO CAPS: 100.0000 mg | ORAL_CAPSULE | Freq: Two times a day (BID) | ORAL | 0 refills | Status: DC

## 2021-05-30 MED ORDER — DOCUSATE SODIUM 100 MG PO CAPS: 100.0000 mg | ORAL_CAPSULE | Freq: Two times a day (BID) | ORAL | 2 refills | Status: DC | PRN

## 2021-05-30 NOTE — Patient Instructions (Addendum)
° ° °  Have xrays downstairs.    Medications changes include :   gabapentin 100 mg twice daily -- this may cause drowsiness and if it does you can just take it at night.   Start colace ( stool softener) 1-2 times a day for constipation.  Vary how much you take it depending on your stool.     Your prescription(s) have been submitted to your pharmacy. Please take as directed and contact our office if you believe you are having problem(s) with the medication(s).   A referral was ordered for  sports medicine      Someone from their office will call you to schedule an appointment.

## 2021-05-30 NOTE — Assessment & Plan Note (Signed)
Subacute Somewhat of an atypical pain Pain started in the left upper back beginning of December without injury-he thought it felt like shingles and went to urgent care and was treated for shingles without improvement When he came here the pain sounded musculoskeletal in nature-trial of meloxicam and 7.5 mg daily for 2 weeks did not help. Pain has now.  Pain has now spread to left side of the back, areas in the right back and right chest Burning, achy sensation that sounds nervelike Trial of gabapentin 100 mg twice daily-advised this does cause drowsiness and we may need to scale back just to daily at night Chest x-ray, thoracic spine x-ray Will refer to sports medicine for their evaluation

## 2021-06-03 ENCOUNTER — Encounter: Payer: Self-pay | Admitting: Internal Medicine

## 2021-06-03 ENCOUNTER — Other Ambulatory Visit: Payer: Self-pay

## 2021-06-03 ENCOUNTER — Ambulatory Visit: Payer: Medicare PPO | Admitting: Internal Medicine

## 2021-06-03 VITALS — BP 150/72 | HR 65 | Ht 71.0 in | Wt 195.2 lb

## 2021-06-03 DIAGNOSIS — Z95810 Presence of automatic (implantable) cardiac defibrillator: Secondary | ICD-10-CM | POA: Diagnosis not present

## 2021-06-03 DIAGNOSIS — I10 Essential (primary) hypertension: Secondary | ICD-10-CM | POA: Diagnosis not present

## 2021-06-03 DIAGNOSIS — I472 Ventricular tachycardia, unspecified: Secondary | ICD-10-CM

## 2021-06-03 DIAGNOSIS — I5022 Chronic systolic (congestive) heart failure: Secondary | ICD-10-CM | POA: Diagnosis not present

## 2021-06-03 NOTE — Patient Instructions (Signed)
Medication Instructions:  Your physician recommends that you continue on your current medications as directed. Please refer to the Current Medication list given to you today.  Labwork: None ordered.  Testing/Procedures: None ordered.  Follow-Up: Your physician wants you to follow-up in: one year with Cristopher Peru, MD or one of the following Advanced Practice Providers on your designated Care Team:   Tommye Standard, [place] Legrand Como "Jonni Sanger" Chalmers Cater, [place]  Remote monitoring is used to monitor your ICD from home. This monitoring reduces the number of office visits required to check your device to one time per year. It allows [place] to keep an eye on the functioning of your device to ensure it is working properly. You are scheduled for a device check from home on 06/19/2021. You may send your transmission at any time that day. If you have a wireless device, the transmission will be sent automatically. After your physician reviews your transmission, you will receive a postcard with your next transmission date.  Any Other Special Instructions Will Be Listed Below (If Applicable).  If you need a refill on your cardiac medications before your next appointment, please call your pharmacy.

## 2021-06-03 NOTE — Progress Notes (Signed)
HPI [name] returns today for preoperative evaluation. He is a pleasant 71 yo man with chronic systolic heart failure, s/p ICD insertion, h/o VT, and HTN. He feels well. He has class 2 CHF symptoms. No ICD shocks. He has started back working but has finally been able to lose weight. In the interim he notes no chest pain or sob. He plans to start slowing down. Allergies  Allergen Reactions   Zantac [Ranitidine Hcl]     itching     Current Outpatient Medications  Medication Sig Dispense Refill   ACCU-CHEK GUIDE test strip USE TO CHECK SUGAR 4 TIMES A DAY 100 strip 4   amLODipine (NORVASC) 5 MG tablet TAKE 1 TABLET BY MOUTH DAILY. FOLLOW-UP APPT DUE IN DEC MUST SEE PROVIDER FOR FUTURE REFILLS 30 tablet 2   ASPIRIN LOW DOSE 81 MG EC tablet TAKE 1 TABLET BY MOUTH EVERY DAY 30 tablet 2   atorvastatin (LIPITOR) 20 MG tablet TAKE 1 TABLET BY MOUTH DAILY EXCEPT 1/2 TABLET ON TUESDAY, THURSDAY AND SATURDAY. 22 tablet 8   carvedilol (COREG) 25 MG tablet Take 1 tablet (25 mg total) by mouth 2 (two) times daily with a meal. 180 tablet 3   digoxin (LANOXIN) 0.125 MG tablet TAKE 1 TABLET BY MOUTH EVERY DAY 30 tablet 2   docusate sodium (COLACE) 100 MG capsule Take 1 capsule (100 mg total) by mouth 2 (two) times daily as needed for mild constipation. 60 capsule 2   donepezil (ARICEPT) 10 MG tablet Take half tablet (5 mg) daily for 2 weeks, then increase to the full tablet at 10 mg daily 30 tablet 3   famotidine (PEPCID) 20 MG tablet Take 1 tablet (20 mg total) by mouth daily. 30 tablet 5   gabapentin (NEURONTIN) 100 MG capsule Take 1 capsule (100 mg total) by mouth 2 (two) times daily. 60 capsule 0   Lancets (ONETOUCH ULTRASOFT) lancets Use as instructed to test sugar daily and prn. Dx E11.9 100 each 12   omeprazole (PRILOSEC) 20 MG capsule TAKE 1 CAPSULE BY MOUTH DAILY. RECOMMENDED TO TAKE 30 MIN BEFORE BREAKFAST. *MAX PER INSURANCE 30 capsule 5   ramipril (ALTACE) 10 MG capsule TAKE 1 CAPSULE BY  MOUTH TWICE A DAY 60 capsule 5   sitaGLIPtin-metformin (JANUMET) 50-1000 MG tablet Take 1 tablet by mouth daily. Take with a meal 30 tablet 8   sotalol (BETAPACE) 120 MG tablet TAKE 1 TABLET BY MOUTH TWICE A DAY 60 tablet 8   vitamin B-12 (CYANOCOBALAMIN) 1000 MCG tablet TAKE 1 TABLET BY MOUTH EVERY DAY 30 tablet 5   No current facility-administered medications for this visit.     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   Not compatible with MRI    Barrett's esophagus 09/05/2004   2 cm changes seen at index EGD   Cataract    Chronic back pain 69/67/8938   Chronic systolic heart failure    Colon polyps    tubular adenoma   Dysrhythmia    Essential hypertension 08/26/2009   Fatigue 12/02/2020   Generalized anxiety disorder    since defibrillator placement   GERD (gastroesophageal reflux disease)    Hip pain 12/14/2016   History of COVID-19    History of myocardial infarction    Hyperlipidemia    Left knee pain 12/14/2016   Malignant neoplasm of prostate 09/27/2014  s/p prostatectomy   Mild anemia 01/20/2011   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 b. s/p Medtronic ICD.   Obstructive sleep apnea 11/10/2012   no CPAP use   Right foot pain 12/15/2017   RLS (restless legs syndrome) 11/29/2012   S/P radiation therapy    10/10/2014 through 11/26/2014; Prostate bed 6600 cGy in 33 sessions   Shingles 07/28/2016   Type II diabetes mellitus 08/26/2009   Ventricular tachycardia    a. s/p ICD (generator change 09/2012). b. 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 and placed on sotalol   Vitamin B 12 deficiency 03/06/2011   Monthly B12 shots initiated  2011. Nasally  inhaled B12 as of 07/2012    ROS:   All systems reviewed and negative except as noted in the HPI.   Past  Surgical History:  Procedure Laterality Date   CARDIAC DEFIBRILLATOR PLACEMENT  03/11/2004   Medtronic Maximo single lead   COLONOSCOPY W/ BIOPSIES AND POLYPECTOMY  08/2004, 02/24/11   31mm adenoma in 2006,  5 mm polyp (not recovered) 2012   [facility] INTERNAL URETHROTOMY N/A 10/08/2015   Procedure: CYSTOSCOPY WITH DIRECT VISION INTERNAL URETHROTOMY;  Surgeon: Nickie Retort, MD;  Location: [facility];  Service: Urology;  Laterality: N/A;   IMPLANTABLE CARDIOVERTER DEFIBRILLATOR GENERATOR CHANGE N/A 10/07/2012   Procedure: IMPLANTABLE CARDIOVERTER DEFIBRILLATOR GENERATOR CHANGE;  Surgeon: Evans Lance, MD;  Location: [facility] CATH LAB;  Service: Cardiovascular;  Laterality: N/A;   PROSTATECTOMY  06/2003   Dr Janice Norrie   RECTAL SURGERY     UPPER GASTROINTESTINAL ENDOSCOPY  08/2004, 02/24/11   Barrett's esophagus     Family History  Problem Relation Age of Onset   Dementia Mother    Hypertension Mother    Coronary artery disease Father    Prostate cancer Father    Diabetes Father    Dementia Sister    Dementia Brother    Diabetes Paternal Grandmother    Dementia Maternal Aunt    Stroke Neg Hx    Colon cancer Neg Hx    Colon polyps Neg Hx    Esophageal cancer Neg Hx    Rectal cancer Neg Hx    Stomach cancer Neg Hx      Social History   Socioeconomic History   Marital status: Married    Spouse name: Not on file   Number of children: 2   Years of education: 16   Highest education level: Bachelor's degree (e.g., BA, AB, BS)  Occupational History   Occupation: Therapist, sports: [employer]  Tobacco Use   Smoking status: Never   Smokeless tobacco: Never  Vaping Use   Vaping Use: Never used  Substance and Sexual Activity   Alcohol use: No   Drug use: No   Sexual activity: Not on file  Other Topics Concern   Not on file  Social History Narrative   Right handed   No caffeine   Two story home   Social Determinants of Health   Financial Resource  Strain: Low Risk    Difficulty of Paying Living Expenses: Not hard at all  Food Insecurity: No Food Insecurity   Worried About Charity fundraiser in the Last Year: Never true   [place] in the Last Year: Never true  Transportation Needs: No Transportation Needs   Lack of Transportation (Medical): No   Lack of Transportation (  Non-Medical): No  Physical Activity: Sufficiently Active   Days of Exercise per Week: 5 days   Minutes of Exercise per Session: 30 min  Stress: No Stress Concern Present   Feeling of Stress : Not at all  Social Connections: Socially Integrated   Frequency of Communication with Friends and Family: More than three times a week   Frequency of Social Gatherings with Friends and Family: More than three times a week   Attends Religious Services: More than 4 times per year   Active Member of [employer] or Organizations: Yes   Attends Music therapist: More than 4 times per year   Marital Status: Married  Human resources officer Violence: Not At Risk   Fear of Current or Ex-Partner: No   Emotionally Abused: No   Physically Abused: No   Sexually Abused: No     BP (!) 150/72    Pulse 65    Ht 5\' 11"  (1.803 m)    Wt 195 lb 3.2 oz (88.5 kg)    SpO2 98%    BMI 27.22 kg/m   Physical Exam:  Well appearing NAD HEENT: Unremarkable Neck:  No JVD, no thyromegally Lymphatics:  No adenopathy Back:  No CVA tenderness Lungs:  Clear with no wheezes HEART:  Regular rate rhythm, no murmurs, no rubs, no clicks Abd:  soft, positive bowel sounds, no organomegally, no rebound, no guarding Ext:  2 plus pulses, no edema, no cyanosis, no clubbing Skin:  No rashes no nodules Neuro:  CN II through XII intact, motor grossly intact  EKG - nsr  DEVICE  Normal device function.  See PaceArt for details.   Assess/Plan:  1. Chronic systolic heart failure - his symptoms are class 2. He will continue his current meds. 2. ICD - his medtronic single chamber ICD is working  normally. 3. VT - he has had no sustained episodes. He will undergo watchful waiting.  4. Obesity - he has finally been able to lose weight. I encouraged him not to gain any back.  Royston Sinner Jaaziel Peatross,MD.

## 2021-06-07 ENCOUNTER — Other Ambulatory Visit: Payer: Self-pay | Admitting: Internal Medicine

## 2021-06-16 ENCOUNTER — Ambulatory Visit: Payer: Medicare PPO | Admitting: Pulmonary Disease

## 2021-06-19 ENCOUNTER — Ambulatory Visit (INDEPENDENT_AMBULATORY_CARE_PROVIDER_SITE_OTHER): Payer: Medicare PPO

## 2021-06-19 DIAGNOSIS — I428 Other cardiomyopathies: Secondary | ICD-10-CM

## 2021-06-19 LAB — CUP PACEART REMOTE DEVICE CHECK
Battery Remaining Longevity: 41 mo
Battery Voltage: 2.97 V
Brady Statistic RV Percent Paced: 0.06 %
Date Time Interrogation Session: 20230209031805
HighPow Impedance: 63 Ohm
Implantable Lead Implant Date: 20140530
Implantable Lead Location: 753860
Implantable Lead Model: 6935
Implantable Pulse Generator Implant Date: 20140530
Lead Channel Impedance Value: 399 Ohm
Lead Channel Impedance Value: 456 Ohm
Lead Channel Pacing Threshold Amplitude: 0.5 V
Lead Channel Pacing Threshold Pulse Width: 0.4 ms
Lead Channel Sensing Intrinsic Amplitude: 7.25 mV
Lead Channel Sensing Intrinsic Amplitude: 7.25 mV
Lead Channel Setting Pacing Amplitude: 2.5 V
Lead Channel Setting Pacing Pulse Width: 0.4 ms
Lead Channel Setting Sensing Sensitivity: 0.3 mV

## 2021-06-24 NOTE — Progress Notes (Signed)
Remote ICD transmission.   

## 2021-07-07 ENCOUNTER — Other Ambulatory Visit: Payer: Self-pay | Admitting: Internal Medicine

## 2021-07-09 ENCOUNTER — Telehealth: Payer: Self-pay

## 2021-07-09 NOTE — Progress Notes (Signed)
? ? ?Chronic Care Management ?Pharmacy Assistant  ? ?Name: [name]  MRN: [id number] DOB: 03/15/1951 ? ?[name] is an 71 y.o. year old male who presents for his follow-up CCM visit with the clinical pharmacist. ? ?Reason for Encounter: Disease State-General ?  ? ?Recent office visits:  ?05/30/21 Binnie Rail, MD-PCP (upper back pain) Orders: Chest xray, Medications changes include :   gabapentin 100 mg twice daily --Start colace ( stool softener) 1-2 times a day for constipation ? ?05/14/21 Binnie Rail, MD-PCP (upper back pain) Medications changes include :   meloxicam 7.5 mg daily with food x 2 weeks ? ?Recent consult visits:  ?06/03/21 Evans Lance, MD-Cardiology (Chronic heart failure) No med changes ? ?05/06/21 Rondel Jumbo, PA-C (Sleep apnea) Med change:start donepezil half tablet (5mg ) daily for 2  weeks will increase the dose to a full tablet of 10 mg daily.   ? ?04/25/21 Hazle Coca, PhD-Psychology (Mild vascular neurocognitive disorder) No orders or med change ? ?04/18/21 Hazle Coca, PhD-Psychology (Mild vascular neurocognitive disorder) No orders or med change ? ?Hospital visits:  ?None in previous 6 months ? ?Medications: ?Outpatient Encounter Medications as of 07/09/2021  ?Medication Sig  ? ACCU-CHEK GUIDE test strip USE TO CHECK SUGAR 4 TIMES A DAY  ? amLODipine (NORVASC) 5 MG tablet TAKE 1 TABLET BY MOUTH DAILY. FOLLOW-UP APPT DUE IN DEC MUST SEE PROVIDER FOR FUTURE REFILLS  ? ASPIRIN LOW DOSE 81 MG EC tablet TAKE 1 TABLET BY MOUTH EVERY DAY  ? atorvastatin (LIPITOR) 20 MG tablet TAKE 1 TABLET BY MOUTH DAILY EXCEPT 1/2 TABLET ON TUESDAY, THURSDAY AND SATURDAY.  ? carvedilol (COREG) 25 MG tablet Take 1 tablet (25 mg total) by mouth 2 (two) times daily with a meal.  ? digoxin (LANOXIN) 0.125 MG tablet TAKE 1 TABLET BY MOUTH EVERY DAY  ? docusate sodium (COLACE) 100 MG capsule Take 1 capsule (100 mg total) by mouth 2 (two) times daily as needed for mild constipation.  ? donepezil  (ARICEPT) 10 MG tablet Take half tablet (5 mg) daily for 2 weeks, then increase to the full tablet at 10 mg daily  ? famotidine (PEPCID) 20 MG tablet Take 1 tablet (20 mg total) by mouth daily.  ? gabapentin (NEURONTIN) 100 MG capsule Take 1 capsule (100 mg total) by mouth 2 (two) times daily.  ? Lancets (ONETOUCH ULTRASOFT) lancets Use as instructed to test sugar daily and prn. Dx E11.9  ? omeprazole (PRILOSEC) 20 MG capsule TAKE 1 CAPSULE BY MOUTH DAILY. RECOMMENDED TO TAKE 30 MIN BEFORE BREAKFAST. *MAX PER INSURANCE  ? ramipril (ALTACE) 10 MG capsule TAKE 1 CAPSULE BY MOUTH TWICE A DAY  ? sitaGLIPtin-metformin (JANUMET) 50-1000 MG tablet Take 1 tablet by mouth daily. Take with a meal  ? sotalol (BETAPACE) 120 MG tablet TAKE 1 TABLET BY MOUTH TWICE A DAY  ? vitamin B-12 (CYANOCOBALAMIN) 1000 MCG tablet TAKE 1 TABLET BY MOUTH EVERY DAY  ? ?No facility-administered encounter medications on file as of 07/09/2021.  ? ?Have you had any problems recently with your health?Patient states that for the last 3 wks he has been dizzy and had a runny nose. He stated that he was going to call Dr. Quay Burow and make an appointment ? ?Have you had any problems with your pharmacy?Patient stated that he has not had any problems with getting medications or the cost of medications from the pharmacy ? ?What issues or side effects are you having with your medications?Patient states  that he is not having any side effects to medications ? ?What would you like me to pass along to [facility] for them to help you with? Patient states that he is doing ok and will see Dr. Quay Burow soon ? ?What can we do to take care of you better? Patient states that he will call if he needs help with medications ? ?Care Gaps: ?Colonoscopy-11/07/19 ?Diabetic Foot Exam-10/17/19 ?Ophthalmology-04/09/20 ?Dexa Scan - NA ?Annual Well Visit - NA ?Micro albumin-NA ?Hemoglobin A1c- 01/15/21 ? ?Star Rating Drugs: ?Ramipril 10 mg-last fill 06/26/21 ?Atorvastatin 20 mg-last fill  06/26/21 ? ?Ethelene Hal ?Clinical Pharmacist Assistant ?[phone]  ?

## 2021-07-22 ENCOUNTER — Ambulatory Visit: Payer: Medicare PPO | Admitting: Pulmonary Disease

## 2021-07-22 ENCOUNTER — Encounter: Payer: Self-pay | Admitting: Pulmonary Disease

## 2021-07-22 ENCOUNTER — Other Ambulatory Visit: Payer: Self-pay

## 2021-07-22 VITALS — BP 128/74 | HR 70 | Temp 98.0°F | Ht 71.0 in | Wt 195.2 lb

## 2021-07-22 DIAGNOSIS — G2581 Restless legs syndrome: Secondary | ICD-10-CM | POA: Diagnosis not present

## 2021-07-22 DIAGNOSIS — G4733 Obstructive sleep apnea (adult) (pediatric): Secondary | ICD-10-CM | POA: Diagnosis not present

## 2021-07-22 NOTE — Progress Notes (Signed)
? ?Subjective:  ? ? Patient ID: [name], male    DOB: 05/05/1951, 71 y.o.   MRN: [id number] ? ?HPI ? ?71 year old referred by neurology for evaluation of OSA. ?He has been diagnosed with likely vascular dementia and OSA to is to be evaluated as a confounding factor. ?He is accompanied by his wife Neoma Laming who provides additional history ? ?PMH -chronic systolic heart failure status post AICD for VT, hypertension, diabetes type 2, prostate cancer ?Restless leg syndrome ? ?OSA was diagnosed in 2005 but he could not tolerate CPAP.  ?He reports Epworth sleepiness score of 8. 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 ? ?He has lost 40 pounds since his COVID infection in 2022 ? ?I reviewed neurology evaluation from December 2022 where he was started on Aricept. ?He reports pins and needle sensation in his legs which is relieved by walking around at night.  Wife reports that sometimes he will talk in his sleep and describes 1 episode where he was getting ready to wear his pants and go somewhere in his sleep, does not really acting out his dreams ? ?Significant tests/ events reviewed ? ?01/2004 NPSG - wt 235 lbs -RDI 37/hour corrected by CPAP 7 cm ? ?Past Medical History:  ?Diagnosis Date  ? Aortic atherosclerosis 12/05/2020  ? Arthritis   ? knees  ? 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  ? Not compatible with MRI   ? Barrett's esophagus 09/05/2004  ? 2 cm changes seen at index EGD  ? Cataract   ? Chronic back pain 04/25/2019  ? Chronic systolic heart failure   ? Colon polyps   ? tubular adenoma  ? Dysrhythmia   ? Essential hypertension 08/26/2009  ? Fatigue 12/02/2020  ? Generalized anxiety disorder   ? since  defibrillator placement  ? GERD (gastroesophageal reflux disease)   ? Hip pain 12/14/2016  ? History of COVID-19   ? History of myocardial infarction   ? Hyperlipidemia   ? Left knee pain 12/14/2016  ? Malignant neoplasm of prostate 09/27/2014  ? s/p prostatectomy  ? Mild anemia 01/20/2011  ? 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 Neuralgia 10/19/2018  ? NICM (nonischemic cardiomyopathy)   ? a.normal cors by cath in 2005. b. s/p Medtronic ICD.  ? Obstructive sleep apnea 11/10/2012  ? no CPAP use  ? Right foot pain 12/15/2017  ? RLS (restless legs syndrome) 11/29/2012  ? S/P radiation therapy   ? 10/10/2014 through 11/26/2014; Prostate bed 6600 cGy in 33 sessions  ? Shingles 07/28/2016  ? Type II diabetes mellitus 08/26/2009  ? Ventricular tachycardia   ? a. s/p ICD (generator change 09/2012). b. 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 and placed on sotalol  ? Vitamin B 12 deficiency 03/06/2011  ? Monthly B12 shots initiated  2011. Nasally  inhaled B12 as of 07/2012  ? ?Past Surgical History:  ?Procedure Laterality Date  ? CARDIAC DEFIBRILLATOR PLACEMENT  03/11/2004  ? Medtronic Maximo single lead  ? COLONOSCOPY W/ BIOPSIES AND POLYPECTOMY  08/2004, 02/24/11  ? 13m adenoma in 2006,  5 mm polyp (not recovered) 2012  ? CYSTOSCOPY WITH DIRECT VISION INTERNAL URETHROTOMY N/A 10/08/2015  ? Procedure: CYSTOSCOPY WITH  DIRECT VISION INTERNAL URETHROTOMY;  Surgeon: Nickie Retort, MD;  Location: [facility];  Service: Urology;  Laterality: N/A;  ? IMPLANTABLE CARDIOVERTER DEFIBRILLATOR GENERATOR CHANGE N/A 10/07/2012  ? Procedure: IMPLANTABLE CARDIOVERTER DEFIBRILLATOR GENERATOR CHANGE;  Surgeon: Evans Lance, MD;  Location: [facility] C[facility] LAB;  Service: Cardiovascular;  Laterality: N/A;  ? PROSTATECTOMY  06/2003  ? Dr Janice Norrie  ? RECTAL SURGERY    ? UPPER GASTROINTESTINAL ENDOSCOPY  08/2004, 02/24/11  ? Barrett's esophagus  ? ? ?Allergies  ?Allergen Reactions  ? Zantac [Ranitidine Hcl]   ?  itching  ? ? ?Social History  ? ?Socioeconomic History  ?  Marital status: Married  ?  Spouse name: Not on file  ? Number of children: 2  ? Years of education: 110  ? Highest education level: Bachelor's degree (e.g., BA, AB, BS)  ?Occupational History  ? Occupation: Insurance risk surveyor  ?  Employer: [employer]  ?Tobacco Use  ? Smoking status: Never  ? Smokeless tobacco: Never  ?Vaping Use  ? Vaping Use: Never used  ?Substance and Sexual Activity  ? Alcohol use: No  ? Drug use: No  ? Sexual activity: Not on file  ?Other Topics Concern  ? Not on file  ?Social History Narrative  ? Right handed  ? No caffeine  ? Two story home  ? ?Social Determinants of Health  ? ?Financial Resource Strain: Low Risk   ? Difficulty of Paying Living Expenses: Not hard at all  ?Food Insecurity: No Food Insecurity  ? Worried About Charity fundraiser in the Last Year: Never true  ? Ran Out of Food in the Last Year: Never true  ?Transportation Needs: No Transportation Needs  ? Lack of Transportation (Medical): No  ? Lack of Transportation (Non-Medical): No  ?Physical Activity: Sufficiently Active  ? Days of Exercise per Week: 5 days  ? Minutes of Exercise per Session: 30 min  ?Stress: No Stress Concern Present  ? Feeling of Stress : Not at all  ?Social Connections: Socially Integrated  ? Frequency of Communication with Friends and Family: More than three times a week  ? Frequency of Social Gatherings with Friends and Family: More than three times a week  ? Attends Religious Services: More than 4 times per year  ? Active Member of Clubs or Organizations: Yes  ? Attends Archivist Meetings: More than 4 times per year  ? Marital Status: Married  ?Intimate Partner Violence: Not At Risk  ? Fear of Current or Ex-Partner: No  ? Emotionally Abused: No  ? Physically Abused: No  ? Sexually Abused: No  ? ? ? ?Family History  ?Problem Relation Age of Onset  ? Dementia Mother   ? Hypertension Mother   ? Coronary artery disease Father   ? Prostate cancer Father   ? Diabetes Father   ? Dementia Sister   ?  Dementia Brother   ? Diabetes Paternal Grandmother   ? Dementia Maternal Aunt   ? Stroke Neg Hx   ? Colon cancer Neg Hx   ? Colon polyps Neg Hx   ? Esophageal cancer Neg Hx   ? Rectal cancer Neg Hx   ? Stomach cancer Neg Hx   ? ? ? ? ?Review of Systems ? ?Acid heartburn ?Weight loss ?Nasal congestion ? ?Constitutional: negative for anorexia, fevers and sweats  ?Eyes: negative for irritation, redness and visual disturbance  ?Ears, nose, mouth, throat, and face: negative for earaches, epistaxis and sore throat  ?Respiratory: negative for  cough, dyspnea on exertion, sputum and wheezing  ?Cardiovascular: negative for chest pain, dyspnea, lower extremity edema, orthopnea, palpitations and syncope  ?Gastrointestinal: negative for abdominal pain, constipation, diarrhea, melena, nausea and vomiting  ?Genitourinary:negative for dysuria, frequency and hematuria  ?Hematologic/lymphatic: negative for bleeding, easy bruising and lymphadenopathy  ?Musculoskeletal:negative for arthralgias, muscle weakness and stiff joints  ?Neurological: negative for coordination problems, gait problems, headaches and weakness  ?Endocrine: negative for diabetic symptoms including polydipsia, polyuria and weight loss ? ?   ?Objective:  ? Physical Exam ? ?Gen. Pleasant, well-nourished, in no distress, normal affect ?ENT - no pallor,icterus, no post nasal drip ?Neck: No JVD, no thyromegaly, no carotid bruits ?Lungs: no use of accessory muscles, no dullness to percussion, clear without rales or rhonchi  ?Cardiovascular: Rhythm regular, heart sounds  normal, no murmurs or gallops, no peripheral edema ?Abdomen: soft and non-tender, no hepatosplenomegaly, BS normal. ?Musculoskeletal: No deformities, no cyanosis or clubbing ?Neuro:  alert, non focal ? ? ? ?   ?Assessment & Plan:  ? ? ?

## 2021-07-22 NOTE — Assessment & Plan Note (Signed)
OSA was diagnosed on a previous study in 2005. ?We will reassess with a split study.  He is lost 40 pounds since then so it is possible that sleep disordered breathing is improved.  His heart failure also seems to be better controlled now ?

## 2021-07-22 NOTE — Patient Instructions (Signed)
?  X Split night study ?

## 2021-07-22 NOTE — Assessment & Plan Note (Addendum)
He is maintained on gabapentin and if sleep study shows significant PLM's, we will consider adding low-dose Mirapex and assess iron studies ?

## 2021-07-28 ENCOUNTER — Other Ambulatory Visit: Payer: Self-pay | Admitting: Internal Medicine

## 2021-08-03 NOTE — Progress Notes (Signed)
? ? ?Subjective:  ? ? Patient ID: [name], male    DOB: April 29, 1951, 71 y.o.   MRN: [id number] ? ?This visit occurred during the SARS-CoV-2 public health emergency.  Safety protocols were in place, including screening questions prior to the visit, additional usage of staff PPE, and extensive cleaning of exam room while observing appropriate contact time as indicated for disinfecting solutions. ? ? ? ?HPI ?[place] is here for  ?Chief Complaint  ?Patient presents with  ? Running nose  ? Dizziness  ?  Feels dizzy from time to time especially when he wakes up; Feels off balance  ? ? ?His granddaughter and daughter got sick and he got it.  He has had a runny nose x 4 weeks.  It is clear mucus.  He also states some mild congestion, sore throat and postnasal drainage.  Over-the-counter medications have helped temporarily, but once he stops them the symptoms come back. ? ?Has intermittent dizziness - that is actually a lightheadedness.  He feels it if he gets up fast.  He drinks juices and teat during the day.  He does not drink much water. This started before the runny nose.  He denies falling.  He tries not to get up too fast. ? ?Since he is due for his follow-up appointment next week we will discuss his chronic medical problems as well. ? ?Medications and allergies reviewed with patient and updated if appropriate. ? ?Current Outpatient Medications on File Prior to Visit  ?Medication Sig Dispense Refill  ? ACCU-CHEK GUIDE test strip USE TO CHECK SUGAR 4 TIMES A DAY 100 strip 4  ? amLODipine (NORVASC) 5 MG tablet TAKE 1 TABLET BY MOUTH DAILY. FOLLOW-UP APPT DUE IN DEC MUST SEE PROVIDER FOR FUTURE REFILLS 30 tablet 2  ? ASPIRIN LOW DOSE 81 MG EC tablet TAKE 1 TABLET BY MOUTH EVERY DAY 30 tablet 2  ? atorvastatin (LIPITOR) 20 MG tablet TAKE 1 TABLET BY MOUTH DAILY EXCEPT 1/2 TABLET ON TUESDAY, THURSDAY AND SATURDAY. 24 tablet 8  ? carvedilol (COREG) 25 MG tablet Take 1 tablet (25 mg total) by mouth 2 (two) times daily  with a meal. 180 tablet 3  ? digoxin (LANOXIN) 0.125 MG tablet TAKE 1 TABLET BY MOUTH EVERY DAY 30 tablet 2  ? docusate sodium (COLACE) 100 MG capsule Take 1 capsule (100 mg total) by mouth 2 (two) times daily as needed for mild constipation. 60 capsule 2  ? donepezil (ARICEPT) 10 MG tablet Take half tablet (5 mg) daily for 2 weeks, then increase to the full tablet at 10 mg daily 30 tablet 3  ? famotidine (PEPCID) 20 MG tablet Take 1 tablet (20 mg total) by mouth daily. 30 tablet 5  ? gabapentin (NEURONTIN) 100 MG capsule Take 1 capsule (100 mg total) by mouth 2 (two) times daily. 60 capsule 0  ? Lancets (ONETOUCH ULTRASOFT) lancets Use as instructed to test sugar daily and prn. Dx E11.9 100 each 12  ? omeprazole (PRILOSEC) 20 MG capsule TAKE 1 CAPSULE BY MOUTH DAILY. RECOMMENDED TO TAKE 30 MIN BEFORE BREAKFAST. *MAX PER INSURANCE 30 capsule 5  ? ramipril (ALTACE) 10 MG capsule TAKE 1 CAPSULE BY MOUTH TWICE A DAY 60 capsule 5  ? sitaGLIPtin-metformin (JANUMET) 50-1000 MG tablet Take 1 tablet by mouth daily. Take with a meal 30 tablet 8  ? sotalol (BETAPACE) 120 MG tablet TAKE 1 TABLET BY MOUTH TWICE A DAY 60 tablet 8  ? vitamin B-12 (CYANOCOBALAMIN) 1000 MCG tablet TAKE 1  TABLET BY MOUTH EVERY DAY 30 tablet 5  ? ?No current facility-administered medications on file prior to visit.  ? ? ?Review of Systems  ?Constitutional:  Negative for fever.  ?HENT:  Positive for congestion, postnasal drip, rhinorrhea and sore throat. Negative for ear pain, sinus pressure and sinus pain.   ?Respiratory:  Negative for cough, shortness of breath and wheezing.   ?Cardiovascular:  Negative for chest pain, palpitations and leg swelling.  ?Gastrointestinal:  Positive for nausea (? from PND). Negative for abdominal pain.  ?Neurological:  Positive for dizziness. Negative for light-headedness and headaches.  ? ?   ?Objective:  ? ?Vitals:  ? 08/04/21 0842  ?BP: 118/66  ?Pulse: 66  ?Temp: 98.3 ?F (36.8 ?C)  ?SpO2: 97%  ? ?BP Readings from  Last 3 Encounters:  ?08/04/21 118/66  ?07/22/21 128/74  ?06/03/21 (!) 150/72  ? ?Wt Readings from Last 3 Encounters:  ?08/04/21 195 lb (88.5 kg)  ?07/22/21 195 lb 3.2 oz (88.5 kg)  ?06/03/21 195 lb 3.2 oz (88.5 kg)  ? ?Body mass index is 27.2 kg/m?. ? ?  ?Physical Exam ?Constitutional:   ?   General: He is not in acute distress. ?   Appearance: Normal appearance. He is not ill-appearing.  ?HENT:  ?   Head: Normocephalic.  ?   Right Ear: Tympanic membrane, ear canal and external ear normal. There is no impacted cerumen.  ?   Left Ear: Tympanic membrane, ear canal and external ear normal. There is no impacted cerumen.  ?   Mouth/Throat:  ?   Mouth: Mucous membranes are moist.  ?   Pharynx: No oropharyngeal exudate or posterior oropharyngeal erythema.  ?Eyes:  ?   Conjunctiva/sclera: Conjunctivae normal.  ?Cardiovascular:  ?   Rate and Rhythm: Normal rate and regular rhythm.  ?Pulmonary:  ?   Effort: Pulmonary effort is normal. No respiratory distress.  ?   Breath sounds: Normal breath sounds. No wheezing or rales.  ?Abdominal:  ?   General: There is no distension.  ?   Palpations: Abdomen is soft.  ?   Tenderness: There is no abdominal tenderness.  ?Musculoskeletal:  ?   Cervical back: Neck supple. No tenderness.  ?Lymphadenopathy:  ?   Cervical: No cervical adenopathy.  ?Skin: ?   General: Skin is warm and dry.  ?   Findings: No rash.  ?Neurological:  ?   Mental Status: He is alert.  ? ?   ? ? ? ? ? ?Assessment & Plan:  ? ? ?See Problem List for Assessment and Plan of chronic medical problems.  ? ? ? ? ?

## 2021-08-04 ENCOUNTER — Other Ambulatory Visit: Payer: Self-pay | Admitting: Internal Medicine

## 2021-08-04 ENCOUNTER — Other Ambulatory Visit: Payer: Self-pay

## 2021-08-04 ENCOUNTER — Encounter: Payer: Self-pay | Admitting: Psychology

## 2021-08-04 ENCOUNTER — Encounter: Payer: Self-pay | Admitting: Internal Medicine

## 2021-08-04 ENCOUNTER — Ambulatory Visit: Payer: Medicare PPO | Admitting: Internal Medicine

## 2021-08-04 VITALS — BP 118/66 | HR 66 | Temp 98.3°F | Ht 71.0 in | Wt 195.0 lb

## 2021-08-04 DIAGNOSIS — E7849 Other hyperlipidemia: Secondary | ICD-10-CM | POA: Diagnosis not present

## 2021-08-04 DIAGNOSIS — E1151 Type 2 diabetes mellitus with diabetic peripheral angiopathy without gangrene: Secondary | ICD-10-CM | POA: Diagnosis not present

## 2021-08-04 DIAGNOSIS — I10 Essential (primary) hypertension: Secondary | ICD-10-CM | POA: Diagnosis not present

## 2021-08-04 DIAGNOSIS — F01A0 Vascular dementia, mild, without behavioral disturbance, psychotic disturbance, mood disturbance, and anxiety: Secondary | ICD-10-CM | POA: Diagnosis not present

## 2021-08-04 DIAGNOSIS — J069 Acute upper respiratory infection, unspecified: Secondary | ICD-10-CM | POA: Insufficient documentation

## 2021-08-04 DIAGNOSIS — E538 Deficiency of other specified B group vitamins: Secondary | ICD-10-CM | POA: Diagnosis not present

## 2021-08-04 LAB — COMPREHENSIVE METABOLIC PANEL
ALT: 15 U/L (ref 0–53)
AST: 14 U/L (ref 0–37)
Albumin: 4.1 g/dL (ref 3.5–5.2)
Alkaline Phosphatase: 123 U/L — ABNORMAL HIGH (ref 39–117)
BUN: 11 mg/dL (ref 6–23)
CO2: 32 mEq/L (ref 19–32)
Calcium: 9.4 mg/dL (ref 8.4–10.5)
Chloride: 107 mEq/L (ref 96–112)
Creatinine, Ser: 1.08 mg/dL (ref 0.40–1.50)
GFR: 69.68 mL/min (ref >60.00)
Glucose, Bld: 110 mg/dL — ABNORMAL HIGH (ref 70–99)
Potassium: 3.8 mEq/L (ref 3.5–5.1)
Sodium: 143 mEq/L (ref 135–145)
Total Bilirubin: 0.6 mg/dL (ref 0.2–1.2)
Total Protein: 7.3 g/dL (ref 6.0–8.3)

## 2021-08-04 LAB — CBC WITH DIFFERENTIAL/PLATELET
Basophils Absolute: 0 10*3/uL (ref 0.0–0.1)
Basophils Relative: 0.3 % (ref 0.0–3.0)
Eosinophils Absolute: 0.1 10*3/uL (ref 0.0–0.7)
Eosinophils Relative: 2.2 % (ref 0.0–5.0)
HCT: 35.9 % — ABNORMAL LOW (ref 39.0–52.0)
Hemoglobin: 11.6 g/dL — ABNORMAL LOW (ref 13.0–17.0)
Lymphocytes Relative: 27.1 % (ref 12.0–46.0)
Lymphs Abs: 1.4 10*3/uL (ref 0.7–4.0)
MCHC: 32.4 g/dL (ref 30.0–36.0)
MCV: 72.4 fl — ABNORMAL LOW (ref 78.0–100.0)
Monocytes Absolute: 0.5 10*3/uL (ref 0.1–1.0)
Monocytes Relative: 10.3 % (ref 3.0–12.0)
Neutro Abs: 3.1 10*3/uL (ref 1.4–7.7)
Neutrophils Relative %: 60.1 % (ref 43.0–77.0)
Platelets: 202 10*3/uL (ref 150.0–400.0)
RBC: 4.96 Mil/uL (ref 4.22–5.81)
RDW: 15.8 % — ABNORMAL HIGH (ref 11.5–15.5)
WBC: 5.1 10*3/uL (ref 4.0–10.5)

## 2021-08-04 LAB — LIPID PANEL
Cholesterol: 99 mg/dL (ref 0–200)
HDL: 40 mg/dL (ref >39.00)
LDL Cholesterol: 45 mg/dL (ref 0–99)
NonHDL: 58.54
Total CHOL/HDL Ratio: 2
Triglycerides: 69 mg/dL (ref 0.0–149.0)
VLDL: 13.8 mg/dL (ref 0.0–40.0)

## 2021-08-04 LAB — HEMOGLOBIN A1C: Hgb A1c MFr Bld: 6.6 % — ABNORMAL HIGH (ref 4.6–6.5)

## 2021-08-04 LAB — MICROALBUMIN / CREATININE URINE RATIO
Creatinine,U: 222.2 mg/dL
Microalb Creat Ratio: 0.8 mg/g (ref 0.0–30.0)
Microalb, Ur: 1.7 mg/dL (ref 0.0–1.9)

## 2021-08-04 MED ORDER — AMLODIPINE BESYLATE 2.5 MG PO TABS: 2.5000 mg | ORAL_TABLET | Freq: Every day | ORAL | 1 refills | Status: DC

## 2021-08-04 NOTE — Assessment & Plan Note (Signed)
Chronic ?Regular exercise and healthy diet encouraged ?Check lipid panel  ?Continue atorvastatin 10 mg 3 days a week, 20 mg 4 days a week ?

## 2021-08-04 NOTE — Patient Instructions (Addendum)
? ? ? ?  Blood work was ordered.   ? ? ? ?Medications changes include :   start Claritin 10 mg daily for your symptoms.   Decrease amlodipine to 2.5 mg daily ? ? ? ? ? ?Return in about 6 months (around 02/04/2022) for follow up;  cancel April appt. ? ?

## 2021-08-04 NOTE — Assessment & Plan Note (Signed)
Chronic ?Blood pressure very well controlled, but he is experiencing some orthostatic hypotension ?Decrease amlodipine to 2.5 mg daily ?Continue carvedilol 25 mg twice daily, ramipril 10 mg twice daily and sotalol 120 mg twice daily ?CMP ?

## 2021-08-04 NOTE — Assessment & Plan Note (Signed)
Chronic Taking B12 daily Check B12 level 

## 2021-08-04 NOTE — Assessment & Plan Note (Signed)
Chronic ?Following with neurology ?Continue Aricept 10 mg daily ?He is still working, but is thinking about retiring. ?

## 2021-08-04 NOTE — Assessment & Plan Note (Signed)
Chronic ?Lab Results  ?Component Value Date  ? HGBA1C 6.2 01/15/2021  ? ?Sugars well controlled ?Check A1c, urine microalbumin today ?Continue Janumet 50-1000 mg once daily ?Stressed regular exercise, diabetic diet ?Will adjust medication as needed ? ?

## 2021-08-04 NOTE — Assessment & Plan Note (Signed)
Acute ?Symptoms consistent with viral URI ?Allergies may also be contributing ?Start Claritin OTC 10 mg daily ?

## 2021-08-05 LAB — VITAMIN B12: Vitamin B-12: 974 pg/mL — ABNORMAL HIGH (ref 211–911)

## 2021-08-05 LAB — TSH: TSH: 3.31 u[IU]/mL (ref 0.35–5.50)

## 2021-08-06 ENCOUNTER — Other Ambulatory Visit: Payer: Self-pay | Admitting: Internal Medicine

## 2021-08-08 ENCOUNTER — Ambulatory Visit: Payer: Medicare PPO | Admitting: Internal Medicine

## 2021-08-11 ENCOUNTER — Ambulatory Visit: Payer: Medicare PPO | Admitting: Internal Medicine

## 2021-08-30 ENCOUNTER — Other Ambulatory Visit: Payer: Self-pay | Admitting: Physician Assistant

## 2021-09-03 ENCOUNTER — Other Ambulatory Visit: Payer: Self-pay | Admitting: Physician Assistant

## 2021-09-03 ENCOUNTER — Ambulatory Visit (HOSPITAL_BASED_OUTPATIENT_CLINIC_OR_DEPARTMENT_OTHER): Payer: Medicare PPO | Attending: Pulmonary Disease | Admitting: Pulmonary Disease

## 2021-09-03 ENCOUNTER — Encounter: Payer: Self-pay | Admitting: Physician Assistant

## 2021-09-03 DIAGNOSIS — I493 Ventricular premature depolarization: Secondary | ICD-10-CM | POA: Diagnosis not present

## 2021-09-03 DIAGNOSIS — G4733 Obstructive sleep apnea (adult) (pediatric): Secondary | ICD-10-CM | POA: Insufficient documentation

## 2021-09-03 MED ORDER — DONEPEZIL HCL 10 MG PO TABS: 10.0000 mg | ORAL_TABLET | Freq: Every day | ORAL | 11 refills | Status: DC

## 2021-09-08 DIAGNOSIS — G4733 Obstructive sleep apnea (adult) (pediatric): Secondary | ICD-10-CM

## 2021-09-08 NOTE — Procedures (Signed)
Patient Name: [name], [name] ?Study Date: 09/03/2021 ?Gender: Male ?D.O.B: 12/20/1950 ?Age (years): 67 ?Referring Provider: Kara Mead MD, ABSM ?Height (inches): 71 ?Interpreting Physician: Kara Mead MD, ABSM ?Weight (lbs): 193 ?RPSGT: Carolin Coy ?BMI: 27 ?MRN: [id number] ?Neck Size: 15.50 ?<br> <br> ?CLINICAL INFORMATION ?Sleep Study Type: NPSG ? ? ? ?Indication for sleep study: Diabetes, Fatigue, Hypertension, reassessment for OSA after weight loss ?01/2004 NPSG - wt 235 lbs -RDI 37/hour corrected by CPAP 7 cm ? ? ?Epworth Sleepiness Score: 3 ? ? ?SLEEP STUDY TECHNIQUE ?As per the AASM Manual for the Scoring of Sleep and Associated Events v2.3 (April 2016) with a hypopnea requiring 4% desaturations. ? ?The channels recorded and monitored were frontal, central and occipital EEG, electrooculogram (EOG), submentalis EMG (chin), nasal and oral airflow, thoracic and abdominal wall motion, anterior tibialis EMG, snore microphone, electrocardiogram, and pulse oximetry. ? ?MEDICATIONS ?Medications self-administered by patient taken the night of the study : N/A ? ?SLEEP ARCHITECTURE ?The study was initiated at 10:42:07 PM and ended at 4:40:24 AM. ? ?Sleep onset time was 9.1 minutes and the sleep efficiency was 54.7%%. The total sleep time was 196 minutes. ? ?Stage REM was not noted. ? ?The patient spent 61.0%% of the night in stage N1 sleep, 39.0%% in stage N2 sleep, 0.0%% in stage N3 and 0% in REM. ? ?Alpha intrusion was absent. ? ?Supine sleep was 31.08%. ? ?RESPIRATORY PARAMETERS ?The overall apnea/hypopnea index (AHI) was 9.5 per hour. There were 6 total apneas, including 1 obstructive, 5 central and 0 mixed apneas. There were 25 hypopneas and 136 RERAs with RDI 51/h ? ?The AHI during Stage REM sleep was N/A per hour. ? ?AHI while supine was 7.9 per hour. ? ?The mean oxygen saturation was 94.3%. The minimum SpO2 during sleep was 90.0%. ? ?soft snoring was noted during this study. ? ?CARDIAC DATA ?The 2 lead EKG  demonstrated sinus rhythm. The mean heart rate was 68.9 beats per minute. Other EKG findings include: PVCs. ? ?LEG MOVEMENT DATA ?The total PLMS were 0 with a resulting PLMS index of 0.0. Significant LMs were noted but Associated arousal with leg movement index was 2.8 . ? ?IMPRESSIONS ?- Mild obstructive sleep apnea occurred during this study (AHI = 9.5/h). Severe by RDI criteria 50/h due to high RERAs. ?- No significant central sleep apnea occurred during this study (CAI = 1.5/h). ?- The patient had minimal or no oxygen desaturation during the study (Min O2 = 90.0%) ?- The patient snored with soft snoring volume. ?- EKG findings include PVCs. ?- Clinically significant periodic limb movements did not occur during sleep. No significant associated arousals. ? ? ?DIAGNOSIS ?- Obstructive Sleep Apnea (G47.33) ? ? ?RECOMMENDATIONS ?- He would likely beenfit from CPAP therapy given high RDI & CPAP can be tried with a full face mask ?- Avoid alcohol, sedatives and other CNS depressants that may worsen sleep apnea and disrupt normal sleep architecture. ?- Sleep hygiene should be reviewed to assess factors that may improve sleep quality. ?- Weight management and regular exercise should be initiated or continued if appropriate. ? ? ?Kara Mead MD ?Board Certified in Sleep medicine ? ?

## 2021-09-11 ENCOUNTER — Other Ambulatory Visit: Payer: Self-pay

## 2021-09-11 DIAGNOSIS — G4733 Obstructive sleep apnea (adult) (pediatric): Secondary | ICD-10-CM

## 2021-09-18 ENCOUNTER — Ambulatory Visit (INDEPENDENT_AMBULATORY_CARE_PROVIDER_SITE_OTHER): Payer: Medicare PPO

## 2021-09-18 DIAGNOSIS — I428 Other cardiomyopathies: Secondary | ICD-10-CM

## 2021-09-18 LAB — CUP PACEART REMOTE DEVICE CHECK
Battery Remaining Longevity: 36 mo
Battery Voltage: 2.97 V
Brady Statistic RV Percent Paced: 0.01 %
Date Time Interrogation Session: 20230511012404
HighPow Impedance: 60 Ohm
Implantable Lead Implant Date: 20140530
Implantable Lead Location: 753860
Implantable Lead Model: 6935
Implantable Pulse Generator Implant Date: 20140530
Lead Channel Impedance Value: 399 Ohm
Lead Channel Impedance Value: 456 Ohm
Lead Channel Pacing Threshold Amplitude: 0.625 V
Lead Channel Pacing Threshold Pulse Width: 0.4 ms
Lead Channel Sensing Intrinsic Amplitude: 6.75 mV
Lead Channel Sensing Intrinsic Amplitude: 6.75 mV
Lead Channel Setting Pacing Amplitude: 2.5 V
Lead Channel Setting Pacing Pulse Width: 0.4 ms
Lead Channel Setting Sensing Sensitivity: 0.3 mV

## 2021-09-19 ENCOUNTER — Other Ambulatory Visit: Payer: Self-pay

## 2021-09-19 ENCOUNTER — Telehealth: Payer: Self-pay

## 2021-09-19 MED ORDER — AMLODIPINE BESYLATE 2.5 MG PO TABS
2.5000 mg | ORAL_TABLET | Freq: Every day | ORAL | 3 refills | Status: DC
Start: 2021-09-19 — End: 2021-10-31

## 2021-09-19 MED ORDER — FAMOTIDINE 20 MG PO TABS: 20.0000 mg | ORAL_TABLET | Freq: Every day | ORAL | 3 refills | Status: DC

## 2021-09-19 MED ORDER — ASPIRIN 81 MG PO TBEC: 81.0000 mg | DELAYED_RELEASE_TABLET | Freq: Every day | ORAL | 3 refills | Status: DC

## 2021-09-19 MED ORDER — DONEPEZIL HCL 10 MG PO TABS: 10.0000 mg | ORAL_TABLET | Freq: Every day | ORAL | 3 refills | Status: DC

## 2021-09-19 NOTE — Telephone Encounter (Signed)
Faxed in today. 

## 2021-09-19 NOTE — Telephone Encounter (Signed)
Pharmacy is requesting refill on: ?famotidine (PEPCID) 20 MG tablet ?ASPIRIN LOW DOSE 81 MG EC tablet ?amLODipine (NORVASC) 2.5 MG tablet ?donepezil (ARICEPT) 10 MG tablet ? ?Pharmacy ?[facility], [place] ? ?LOV 08/04/21 ?ROV 02/05/22 ? ?

## 2021-09-25 NOTE — Progress Notes (Signed)
Remote ICD transmission.   

## 2021-09-29 ENCOUNTER — Encounter: Payer: Self-pay | Admitting: Internal Medicine

## 2021-09-29 ENCOUNTER — Encounter: Payer: Self-pay | Admitting: Physician Assistant

## 2021-09-29 DIAGNOSIS — G8929 Other chronic pain: Secondary | ICD-10-CM

## 2021-10-01 ENCOUNTER — Encounter: Payer: Self-pay | Admitting: Physician Assistant

## 2021-10-01 ENCOUNTER — Encounter: Payer: Self-pay | Admitting: Internal Medicine

## 2021-10-01 NOTE — Progress Notes (Deleted)
Patient has difficulty ambulating, needs to be seen. Will arrange OP follow up .

## 2021-10-02 ENCOUNTER — Ambulatory Visit: Payer: Medicare PPO | Admitting: Physician Assistant

## 2021-10-02 DIAGNOSIS — F0670 Mild neurocognitive disorder due to known physiological condition without behavioral disturbance: Secondary | ICD-10-CM

## 2021-10-02 DIAGNOSIS — R262 Difficulty in walking, not elsewhere classified: Secondary | ICD-10-CM | POA: Diagnosis not present

## 2021-10-02 DIAGNOSIS — I999 Unspecified disorder of circulatory system: Secondary | ICD-10-CM

## 2021-10-02 DIAGNOSIS — M545 Low back pain, unspecified: Secondary | ICD-10-CM

## 2021-10-02 HISTORY — DX: Difficulty in walking, not elsewhere classified: R26.2

## 2021-10-02 NOTE — Patient Instructions (Addendum)
It was a pleasure to see you today at our office.   Recommendations:  Follow up as schedule fir the memory Agree with Orthopedic surgery evaluation  If there is a loss of feeling at all you must be seen at the hospital    [place]: 1. Continue to exercise (Recommend 30 minutes of walking everyday, or 3 hours every week) 2. Increase social interactions - continue going to [place] and enjoy social gatherings with friends and family 3. Eat healthy, avoid fried foods and eat more fruits and vegetables 4. Maintain adequate blood pressure, blood sugar, and blood cholesterol level. Reducing the risk of stroke and cardiovascular disease also helps promoting better memory. 5. Avoid stressful situations. Live a simple life and avoid aggravations. Organize your time and prepare for the next day in anticipation. 6. Sleep well, avoid any interruptions of sleep and avoid any distractions in the bedroom that may interfere with adequate sleep quality 7. Avoid sugar, avoid sweets as there is a strong link between excessive sugar intake, diabetes, and cognitive impairment We discussed the Mediterranean diet, which has been shown to help patients reduce the risk of progressive memory disorders and reduces cardiovascular risk. This includes eating fish, eat fruits and green leafy vegetables, nuts like almonds and hazelnuts, walnuts, and also use olive oil. Avoid fast foods and fried foods as much as possible. Avoid sweets and sugar as sugar use has been linked to worsening of memory function.  There is always a concern of gradual progression of memory problems. If this is the case, then we may need to adjust level of care according to patient needs. Support, both to the patient and caregiver, should then be put into place.    FALL PRECAUTIONS: Be cautious when walking. Scan the area for obstacles that may increase the risk of trips and falls. When getting up in the  mornings, sit up at the edge of the bed for a few minutes before getting out of bed. Consider elevating the bed at the head end to avoid drop of blood pressure when getting up. Walk always in a well-lit room (use night lights in the walls). Avoid area rugs or power cords from appliances in the middle of the walkways. Use a walker or a cane if necessary and consider physical therapy for balance exercise. Get your eyesight checked regularly.  FINANCIAL OVERSIGHT: Supervision, especially oversight when making financial decisions or transactions is also recommended.  HOME SAFETY: Consider the safety of the kitchen when operating appliances like stoves, microwave oven, and blender. Consider having supervision and share cooking responsibilities until no longer able to participate in those. Accidents with firearms and other hazards in the house should be identified and addressed as well.   ABILITY TO BE LEFT ALONE: If patient is unable to contact 911 operator, consider using LifeLine, or when the need is there, arrange for someone to stay with patients. Smoking is a fire hazard, consider supervision or cessation. Risk of wandering should be assessed by caregiver and if detected at any point, supervision and safe proof recommendations should be instituted.  MEDICATION SUPERVISION: Inability to self-administer medication needs to be constantly addressed. Implement a mechanism to ensure safe administration of the medications.   DRIVING: Regarding driving, in patients with progressive memory problems, driving will be impaired. We advise to have someone else do the driving if trouble finding directions or if minor accidents are reported. Independent driving assessment is available to determine safety of driving.  If you are interested in the driving assessment, you can contact the following:  [employer] in [place]  [place] [phone]  [place]  [facility] [phone] or [phone]

## 2021-10-02 NOTE — Progress Notes (Signed)
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  In review, the patient has a history of L4-L5 and L5-S1 spondylosis with possible T spine spondylosis.  In today's visit, there is no signs of Parkinson's, and he is able to bilateral equal sensation, normal reflexes.  His pain is reproducible at the  Left L4-5 area.Has bee taking tylenol with some relief.  The patient has an appointment with orthopedic surgeons on October 13, 2021.  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 Last CT head on 02/17/21  (has PMP) was without any acute abnormalities, ventricles were unremarkable. Continue to monitor.  Continues to have hallucinations.  Need to monitor for her underlying AD or LBD. Patient awaiting his CPAP, dissipate once he receives it and uses it, sleep will be improving Continue donepezil 10 mg daily.   Follow-up: May 29 at 9 AM  Case discussed with Dr. Delice Lesch who agrees with the plan     Subjective:    [name] is a very pleasant 71 y.o. RH male  seen today in follow up prior to his scheduled visit for memory loss for back pain and difficulty  walking.  This patient is accompanied in the office by his wife who supplements the history.  Previous records as well as any outside records available were reviewed prior to todays visit.   Apparently, the patient has had these discomfort for at least a year, dating back to July 2022.  However, "he chose not to give this information ".  Initially, he was seen by his PCP, and a x-ray of the LS spine on 12/03/2018 was performed, showing as, symmetric SI joint and loss of intervertebral disc height is most significant at L5-S1, less so L4-5. Facet arthropathy at L4-5 and L5-S1. There also endplate osteophytes in the lower thoracic spine which were suboptimally evaluated.  An MRI of the LS spine could not be performed because the patient has a pacemaker.  He was seen by his PCP next week, due to back pain, "stumbling while walking, mild tingling in the feet ".  "He states that it is hard for him to walk straight ", wanted to be seen at our office.  He states that when he is sitting "it does not hurt as much ".  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  He denies any leg pain such as sciatic pain.  Today, he denies any numbness or tingling.  He reports that his difficulty walking is "more related to pain on the left side of his back, lumbar area".  He denies any difficulty urinating or with bowel movements.  There are no other areas of numbness or tingling.  He denies any shuffling.  He reports that when using heat, the  symptoms appear to be ameliorated.  He denies any confusion or headaches.Has been taking tylenol with some relief. HAs taken gabapentin in the past without any help.       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   Not compatible with MRI    Barrett's esophagus 09/05/2004   2 cm changes seen at index EGD   Cataract     Chronic back pain 69/67/8938   Chronic systolic heart failure    Colon polyps    tubular adenoma   Dysrhythmia    Essential hypertension 08/26/2009   Fatigue 12/02/2020   Generalized anxiety disorder    since defibrillator placement   GERD (gastroesophageal reflux disease)    Hip pain 12/14/2016   History of COVID-19    History of myocardial infarction    Hyperlipidemia    Left knee pain 12/14/2016   Malignant neoplasm of prostate 09/27/2014   s/p prostatectomy   Mild anemia 01/20/2011   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 b. s/p Medtronic ICD.   Obstructive sleep apnea 11/10/2012   no CPAP use   Right foot pain 12/15/2017   RLS (restless legs syndrome) 11/29/2012   S/P radiation therapy    10/10/2014 through 11/26/2014; Prostate bed 6600 cGy in 33 sessions   Shingles 07/28/2016   Type II diabetes mellitus 08/26/2009   Ventricular tachycardia    a. s/p ICD (generator change 09/2012). b. 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 and placed on sotalol   Vitamin B 12 deficiency 03/06/2011   Monthly B12 shots initiated  2011. Nasally  inhaled B12 as of 07/2012     Past Surgical History:  Procedure Laterality Date   CARDIAC DEFIBRILLATOR PLACEMENT  03/11/2004   Medtronic Maximo single lead   COLONOSCOPY W/ BIOPSIES AND POLYPECTOMY  08/2004, 02/24/11   38m adenoma in 2006,  5 mm polyp (not recovered) 2012   [place]   Procedure: CYSTOSCOPY WITH DIRECT VISION INTERNAL URETHROTOMY;  Surgeon: BNickie Retort MD;  Location: [facility];  Service: Urology;  Laterality: N/A;   IMPLANTABLE CARDIOVERTER DEFIBRILLATOR GENERATOR CHANGE N/A 10/07/2012   Procedure: IMPLANTABLE CARDIOVERTER DEFIBRILLATOR GENERATOR CHANGE;  Surgeon: GEvans Lance MD;  [facility];  Service: Cardiovascular;  Laterality: N/A;   PROSTATECTOMY  06/2003   Dr NJanice Norrie   RECTAL SURGERY     UPPER GASTROINTESTINAL ENDOSCOPY  08/2004, 02/24/11   Barrett's esophagus     PREVIOUS MEDICATIONS:   CURRENT MEDICATIONS:  Outpatient Encounter Medications as of 10/02/2021  Medication Sig   ACCU-CHEK GUIDE test strip USE TO CHECK SUGAR 4 TIMES A DAY   amLODipine (NORVASC) 2.5 MG tablet Take 1 tablet (2.5 mg total) by mouth daily.   aspirin (ASPIRIN LOW DOSE) 81 MG EC tablet Take 1 tablet (81 mg total) by mouth daily. Swallow whole.   atorvastatin (LIPITOR) 20 MG tablet TAKE 1 TABLET BY MOUTH DAILY EXCEPT 1/2 TABLET ON TUESDAY, THURSDAY AND SATURDAY.   carvedilol (COREG) 25 MG tablet Take 1 tablet (25 mg total) by mouth 2 (two) times daily with a meal.   digoxin (LANOXIN) 0.125 MG tablet TAKE 1 TABLET BY MOUTH EVERY DAY   docusate sodium (COLACE) 100 MG capsule Take 1 capsule (100 mg total) by mouth 2 (two) times daily  as needed for mild constipation.   donepezil (ARICEPT) 10 MG tablet Take 1 tablet (10 mg total) by mouth daily.   famotidine (PEPCID) 20 MG tablet Take 1 tablet (20 mg total) by mouth daily.   gabapentin (NEURONTIN) 100 MG capsule TAKE 1 CAPSULE BY MOUTH TWICE A DAY   Lancets (ONETOUCH ULTRASOFT) lancets Use as instructed to test sugar daily and prn. Dx E11.9   omeprazole (PRILOSEC) 20 MG capsule TAKE 1 CAPSULE BY MOUTH DAILY. RECOMMENDED TO TAKE 30 MIN BEFORE BREAKFAST. *MAX PER INSURANCE   ramipril (ALTACE) 10 MG capsule TAKE 1 CAPSULE BY MOUTH TWICE A DAY   sitaGLIPtin-metformin (JANUMET) 50-1000 MG tablet Take 1 tablet by mouth daily. Take with a meal   sotalol (BETAPACE) 120 MG tablet TAKE 1 TABLET BY MOUTH TWICE A DAY   vitamin B-12 (CYANOCOBALAMIN) 1000 MCG tablet TAKE 1 TABLET BY MOUTH EVERY DAY   No facility-administered encounter medications on file as of 10/02/2021.     Objective:     PHYSICAL EXAMINATION:    VITALS:  There were no vitals filed for this visit.  GEN:  The patient appears stated age and is in NAD. HEENT:  Normocephalic,  atraumatic.   Neurological examination:  General: NAD, well-groomed, appears stated age. Orientation: The patient is alert. Oriented to person, place and date Cranial nerves: There is good facial symmetry.The speech is fluent and clear. No aphasia or dysarthria. Fund of knowledge is appropriate. Recent memory impaired and remote memory is normal.  Attention and concentration are normal.  Able to name objects and repeat phrases.  Hearing is intact to conversational tone.    Sensation: Sensation is intact to light touch throughout Motor: Strength is at least antigravity x4. Tremors: none  DTR's 2/4 in UE/LE      12/05/2020   10:00 AM  Montreal Cognitive Assessment   Visuospatial/ Executive (0/5) 3  Naming (0/3) 2  Attention: Read list of digits (0/2) 2  Attention: Read list of letters (0/1) 1  Attention: Serial 7 subtraction starting at 100 (0/3) 1  Language: Repeat phrase (0/2) 0  Language : Fluency (0/1) 0  Abstraction (0/2) 0  Delayed Recall (0/5) 1  Orientation (0/6) 6  Total 16        View : No data to display.             Movement examination: Tone: There is normal tone in the UE/LE Abnormal movements:  no tremor.  No myoclonus.  No asterixis.   Coordination:  There is no decremation with RAM's. Normal finger to nose  Gait and Station: The patient has mild difficulty arising out of a deep-seated chair without the use of the hands, needs to push with his arms out of the chair.  No ataxia is noted.  No shuffling.  Gait is narrow and cautious.. The patient's stride length is good.  Sensation intact.  Reflexes normal bilaterally.     Thank you for allowing [place] the opportunity to participate in the care of this nice patient. Please do not hesitate to contact us for any questions or concerns.   Total time spent on today's visit was 56 minutes dedicated to this patient today, preparing to see patient, examining the patient, ordering tests and/or medications and counseling the  patient, documenting clinical information in the EHR or other health record, independently interpreting results and communicating results to the patient/family, discussing treatment and goals, answering patient's questions and coordinating care.  Cc:  Binnie Rail, MD

## 2021-10-13 ENCOUNTER — Encounter: Payer: Self-pay | Admitting: Physician Assistant

## 2021-10-13 ENCOUNTER — Ambulatory Visit: Payer: Medicare PPO | Admitting: Physician Assistant

## 2021-10-13 DIAGNOSIS — M5440 Lumbago with sciatica, unspecified side: Secondary | ICD-10-CM | POA: Diagnosis not present

## 2021-10-13 NOTE — Progress Notes (Signed)
Office Visit Note   Patient: [name]           Date of Birth: [birth year]           MRN: [id number] Visit Date: 10/13/2021              Requested by: Binnie Rail, MD [place],  [place] [id number] PCP: Binnie Rail, MD  Chief Complaint  Patient presents with   Lower Back - New Patient (Initial Visit)      HPI: Patient is a 71 year old gentleman who presents today with a 1 year plus history of low back pain.  Denies any injuries or previous surgeries.  Denies any change in bowel or bladder habits.  He just takes Tylenol which gives him some relief.  He has been followed by his primary care doctor.  He is concerned as his wife is as this is getting worse and he is having trouble walking.  He has also been worked up by a neurologist who felt that this was more an orthopedic problem patient's past medical history is noted for a history of prostate carcinoma in 2016  Assessment & Plan: Visit Diagnoses:  1. Acute right-sided low back pain with sciatica, sciatica laterality unspecified     Plan: Patient cannot have an MRI because he has pacemaker given his increasing pain over the last year and his now having some difficulty in ambulating which does not correlate with a neurologic cause I would like to get a CT of his lumbar spine.  He might be a candidate for epidural steroid injections.  His x-rays show a lot of degenerative changes that he had a year ago.  Given his history of prostate cancer would also like to make sure that there is not any osseous lesions  Follow-Up Instructions: No follow-ups on file.   Ortho Exam  Patient is alert, oriented, no adenopathy, well-dressed, normal affect, normal respiratory effort. Patient is alert pleasant to exam.  He does have a slight gait antalgia.  He has good strength with dorsiflexion plantarflexion of his both his feet and ankles extension and flexion of his knees and flexion of his hips.  Pain is focused over the lower  back does not seem to radiate.  Imaging: No results found. No images are attached to the encounter.  Labs: Lab Results  Component Value Date   HGBA1C 6.6 (H) 08/04/2021   HGBA1C 6.2 01/15/2021   HGBA1C 6.4 11/14/2020   ESRSEDRATE 18 11/14/2020     Lab Results  Component Value Date   ALBUMIN 4.1 08/04/2021   ALBUMIN 3.9 01/15/2021   ALBUMIN 4.1 11/14/2020    Lab Results  Component Value Date   MG 1.8 04/13/2019   MG 2.0 07/08/2018   MG 2.0 01/01/2011   Lab Results  Component Value Date   VD25OH 34.68 11/14/2020    No results found for: PREALBUMIN    Latest Ref Rng & Units 08/04/2021    9:20 AM 01/15/2021    9:48 AM 11/14/2020   12:04 PM  CBC EXTENDED  WBC 4.0 - 10.5 K/uL 5.1   6.3   7.6    RBC 4.22 - 5.81 Mil/uL 4.96   4.85   4.79    Hemoglobin 13.0 - 17.0 g/dL 11.6   11.3   11.0    HCT 39.0 - 52.0 % 35.9   36.1   34.4    Platelets 150.0 - 400.0 K/uL 202.0   225.0  230.0    NEUT# 1.4 - 7.7 K/uL 3.1   4.0   5.4    Lymph# 0.7 - 4.0 K/uL 1.4   1.5   1.6       There is no height or weight on file to calculate BMI.  Orders:  Orders Placed This Encounter  Procedures   CT LUMBAR SPINE WO CONTRAST   No orders of the defined types were placed in this encounter.    Procedures: No procedures performed  Clinical Data: No additional findings.  ROS:  All other systems negative, except as noted in the HPI. Review of Systems  Objective: Vital Signs: There were no vitals taken for this visit.  Specialty Comments:  No specialty comments available.  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   History of COVID-19    Aortic atherosclerosis 12/05/2020   Fatigue  12/02/2020   Neuralgia 10/19/2018   Shingles 07/28/2016   GERD (gastroesophageal reflux disease) 07/29/2015   Malignant neoplasm of prostate ([place]) 09/27/2014   RLS (restless legs syndrome) 11/29/2012   OSA (obstructive sleep apnea) 11/10/2012   Vitamin B 12 deficiency 99/24/2683   Chronic systolic heart failure 41/96/2229   Mild anemia 01/20/2011   Ventricular tachycardia (HCC)    NICM (nonischemic cardiomyopathy) ([place])    Type II diabetes mellitus 08/26/2009   Hyperlipidemia 08/26/2009   Essential hypertension 08/26/2009   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   Barrett's esophagus 09/05/2004   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   Not compatible with MRI    Barrett's esophagus 09/05/2004   2 cm changes seen at index EGD   Cataract    Chronic back pain 79/89/2119   Chronic systolic heart failure    Colon polyps    tubular adenoma   Dysrhythmia    Essential hypertension 08/26/2009   Fatigue 12/02/2020   Generalized anxiety disorder    since defibrillator placement   GERD (gastroesophageal reflux disease)    Hip pain 12/14/2016   History of COVID-19    History of myocardial infarction    Hyperlipidemia    Left knee pain 12/14/2016   Malignant neoplasm of prostate 09/27/2014   s/p prostatectomy   Mild anemia 01/20/2011   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 b. s/p Medtronic ICD.   Obstructive sleep apnea 11/10/2012   no CPAP use   Right foot pain 12/15/2017   RLS (restless legs syndrome) 11/29/2012   S/P radiation therapy    10/10/2014 through 11/26/2014; Prostate bed 6600 cGy in 33 sessions   Shingles 07/28/2016   Type II diabetes mellitus 08/26/2009   Ventricular tachycardia ([place])    a. s/p ICD  (generator change 09/2012). b. 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 and placed on sotalol   Vitamin B 12 deficiency 03/06/2011   Monthly B12 shots initiated  2011. Nasally  inhaled B12 as of 07/2012    Family History  Problem Relation Age of Onset   Dementia Mother    Hypertension Mother    Coronary artery disease Father  Prostate cancer Father    Diabetes Father    Dementia Sister    Dementia Brother    Diabetes Paternal Grandmother    Dementia Maternal Aunt    Stroke Neg Hx    Colon cancer Neg Hx    Colon polyps Neg Hx    Esophageal cancer Neg Hx    Rectal cancer Neg Hx    Stomach cancer Neg Hx     Past Surgical History:  Procedure Laterality Date   CARDIAC DEFIBRILLATOR PLACEMENT  03/11/2004   Medtronic Maximo single lead   COLONOSCOPY W/ BIOPSIES AND POLYPECTOMY  08/2004, 02/24/11   8m adenoma in 2006,  5 mm polyp (not recovered) 2012   CYSTOSCOPY WITH DIRECT VISION INTERNAL URETHROTOMY N/A 10/08/2015   Procedure: CYSTOSCOPY WITH DIRECT VISION INTERNAL URETHROTOMY;  Surgeon: BNickie Retort MD;  Location: [facility];  Service: Urology;  Laterality: N/A;   IMPLANTABLE CARDIOVERTER DEFIBRILLATOR GENERATOR CHANGE N/A 10/07/2012   Procedure: IMPLANTABLE CARDIOVERTER DEFIBRILLATOR GENERATOR CHANGE;  Surgeon: GEvans Lance MD;  [facility];  Service: Cardiovascular;  Laterality: N/A;   PROSTATECTOMY  06/2003   Dr NJanice Norrie  RECTAL SURGERY     UPPER GASTROINTESTINAL ENDOSCOPY  08/2004, 02/24/11   Barrett's esophagus   Social History   Occupational History   Occupation: BTherapist, sports [employer]  Tobacco Use   Smoking status: Never   Smokeless tobacco: Never  Vaping Use   Vaping Use: Never used  Substance and Sexual Activity   Alcohol use: No   Drug use: No   Sexual activity: Not on file

## 2021-10-17 ENCOUNTER — Telehealth: Payer: Self-pay | Admitting: Pulmonary Disease

## 2021-10-17 DIAGNOSIS — G4733 Obstructive sleep apnea (adult) (pediatric): Secondary | ICD-10-CM

## 2021-10-17 NOTE — Telephone Encounter (Signed)
Pt's wife stated that [employer] gave pt a CPAP mask with no magnets in it due to having a pacemaker. Patients wife states that the mask they were given has velcro straps and they are wanting a mask that has magnets in it. She states [employer] told them they will need an order stating that it is okay for patient to have a mask with  magnets while having a pacemaker.   Dr. Elsworth Soho please advise

## 2021-10-21 NOTE — Telephone Encounter (Signed)
Called and notified patients wife Hilda Blades that Dr. Elsworth Soho said it was okay to use CPAP mask with magnets and the order was sent in to [employer] but to allow 24-48 hours for order to be faxed over by [facility]. Patients wife voiced understanding and nothing further is needed at this time.

## 2021-10-22 ENCOUNTER — Other Ambulatory Visit: Payer: Self-pay | Admitting: Internal Medicine

## 2021-10-24 ENCOUNTER — Ambulatory Visit
Admission: RE | Admit: 2021-10-24 | Discharge: 2021-10-24 | Disposition: A | Discharge status: Home / Self Care | Payer: Medicare PPO | Source: Ambulatory Visit | Attending: Physician Assistant | Admitting: Physician Assistant

## 2021-10-24 DIAGNOSIS — M5440 Lumbago with sciatica, unspecified side: Secondary | ICD-10-CM

## 2021-10-24 DIAGNOSIS — M545 Low back pain, unspecified: Secondary | ICD-10-CM | POA: Diagnosis not present

## 2021-10-28 ENCOUNTER — Encounter: Payer: Self-pay | Admitting: Orthopaedic Surgery

## 2021-10-28 ENCOUNTER — Ambulatory Visit: Payer: Medicare PPO | Admitting: Orthopaedic Surgery

## 2021-10-28 DIAGNOSIS — M5440 Lumbago with sciatica, unspecified side: Secondary | ICD-10-CM | POA: Diagnosis not present

## 2021-10-28 NOTE — Progress Notes (Signed)
Office Visit Note   Patient: [name]           Date of Birth: 1950/09/14           MRN: [id number] Visit Date: 10/28/2021              Requested by: Binnie Rail, MD [place],  [place] [address] PCP: Binnie Rail, MD   Assessment & Plan: Visit Diagnoses:  1. Acute right-sided low back pain with sciatica, sciatica laterality unspecified     Plan: [name] had a CT scan of his lumbar spine.  He does have a cardiac pacemaker and could not have an MRI scan.  The study demonstrated multilevel facet arthropathy which causes mild neural foraminal narrowing bilaterally at L2-3, L3-4 and L4-5 as well as on the right at L1-2 and L5-S1.  No spinal canal stenosis.  There was narrowing of the lateral recesses at L3-4 and L4-5 which could affect the descending L4 and L5 nerve roots.  [name] is not experiencing any leg pain but feels like he is losing some "control".  He does have a history of prostate cancer with surgery at least 20 years ago.  His PSAs have been close to 0 ever since.  When he does experience pain it is mostly in the thoracic and lumbar spine.  He had a chest x-ray in January that was negative except for some arthritic changes in the thoracic spine with calcification along the anterior longitudinal ligament.  I did review the films and did not think there were any lung lesions or rib lesions.  He does note that the pain does come and go.  He has been seen by the neurologist who wanted him to be evaluated by the orthopedist before proceeding with any further diagnostic studies.  I am going to order EMGs and nerve conduction studies of both lower extremities because he is having trouble walking and feeling like his legs are "normal".  We will plan to see him back shortly thereafter if the studies are nondiagnostic and like the neurologist to reevaluate him.  We may also want to consider physical therapy for his thoracic and lumbar spine arthritis  Follow-Up  Instructions: Return After EMG and nerve conduction studies both lower extremities.   Orders:  Orders Placed This Encounter  Procedures   Ambulatory referral to Physical Medicine Rehab   No orders of the defined types were placed in this encounter.     Procedures: No procedures performed   Clinical Data: No additional findings.   Subjective: Chief Complaint  Patient presents with   Lower Back - Follow-up   Patient presents today to discuss his CT of his lumbar spine. Patient states that he has been having increased tightness, muscle spasms, and aching pains. Reports that at this time he is currently not taking any medications to help manage his pain. No previous back surgeries to report.   Review of Systems   Objective: Vital Signs: There were no vitals taken for this visit.  Physical Exam Constitutional:      Appearance: He is well-developed.  Pulmonary:     Effort: Pulmonary effort is normal.  Skin:    General: Skin is warm and dry.  Neurological:     Mental Status: He is alert and oriented to person, place, and time.  Psychiatric:        Behavior: Behavior normal.    Ortho Exam awake alert and oriented x3.  Comfortable sitting.  Minimal percussible tenderness in the mid thoracic spine and in the lower lumbar spine.  No flank pain.  Some discomfort beneath the right scapula in the intercostal region.  No masses. Painless ROM cervical spine without referred discomfort. SLR neg.Reflexes symmmetrical.   Specialty Comments:  No specialty comments available.  Imaging: No results found.   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   History of COVID-19    Aortic  atherosclerosis 12/05/2020   Fatigue 12/02/2020   Neuralgia 10/19/2018   Shingles 07/28/2016   GERD (gastroesophageal reflux disease) 07/29/2015   Malignant neoplasm of prostate ([facility]) 09/27/2014   RLS (restless legs syndrome) 11/29/2012   OSA (obstructive sleep apnea) 11/10/2012   Vitamin B 12 deficiency 16/11/3708   Chronic systolic heart failure 62/69/4854   Mild anemia 01/20/2011   Ventricular tachycardia (HCC)    NICM (nonischemic cardiomyopathy) ([facility])    Type II diabetes mellitus 08/26/2009   Hyperlipidemia 08/26/2009   Essential hypertension 08/26/2009   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   Barrett's esophagus 09/05/2004   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   Not compatible with MRI    Barrett's esophagus 09/05/2004   2 cm changes seen at index EGD   Cataract    Chronic back pain 62/70/3500   Chronic systolic heart failure    Colon polyps    tubular adenoma   Dysrhythmia    Essential hypertension 08/26/2009   Fatigue 12/02/2020   Generalized anxiety disorder    since defibrillator placement   GERD (gastroesophageal reflux disease)    Hip pain 12/14/2016   History of COVID-19    History of myocardial infarction    Hyperlipidemia    Left knee pain 12/14/2016   Malignant neoplasm of prostate 09/27/2014   s/p prostatectomy   Mild anemia 01/20/2011   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 b. s/p Medtronic ICD.   Obstructive sleep apnea 11/10/2012   no CPAP use   Right foot pain 12/15/2017   RLS (restless legs syndrome) 11/29/2012   S/P radiation therapy    10/10/2014 through 11/26/2014; Prostate bed 6600 cGy in 33 sessions   Shingles 07/28/2016   Type II diabetes mellitus 08/26/2009   Ventricular  tachycardia ([facility])    a. s/p ICD (generator change 09/2012). b. 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 and placed on sotalol   Vitamin B 12 deficiency 03/06/2011   Monthly B12 shots initiated  2011. Nasally  inhaled B12 as of 07/2012    Family History  Problem Relation Age of Onset   Dementia Mother    Hypertension Mother    Coronary artery disease Father    Prostate cancer Father    Diabetes Father    Dementia Sister    Dementia Brother    Diabetes Paternal Grandmother    Dementia Maternal Aunt    Stroke Neg Hx    Colon cancer Neg Hx    Colon polyps Neg Hx    Esophageal cancer Neg Hx    Rectal cancer Neg Hx  Stomach cancer Neg Hx     Past Surgical History:  Procedure Laterality Date   CARDIAC DEFIBRILLATOR PLACEMENT  03/11/2004   Medtronic Maximo single lead   COLONOSCOPY W/ BIOPSIES AND POLYPECTOMY  08/2004, 02/24/11   45m adenoma in 2006,  5 mm polyp (not recovered) 2012   CConetoeINTERNAL URETHROTOMY N/A 10/08/2015   Procedure: CYSTOSCOPY WITH DIRECT VISION INTERNAL URETHROTOMY;  Surgeon: BNickie Retort MD;  Location: [facility];  Service: Urology;  Laterality: N/A;   IMPLANTABLE CARDIOVERTER DEFIBRILLATOR GENERATOR CHANGE N/A 10/07/2012   Procedure: IMPLANTABLE CARDIOVERTER DEFIBRILLATOR GENERATOR CHANGE;  Surgeon: GEvans Lance MD;  [facility];  Service: Cardiovascular;  Laterality: N/A;   PROSTATECTOMY  06/2003   Dr NJanice Norrie  RECTAL SURGERY     UPPER GASTROINTESTINAL ENDOSCOPY  08/2004, 02/24/11   Barrett's esophagus   Social History   Occupational History   Occupation: BTherapist, [employer]  Tobacco Use   Smoking status: Never   Smokeless tobacco: Never  Vaping Use   Vaping Use: Never used  Substance and Sexual Activity   Alcohol use: No   Drug use: No   Sexual activity: Not on file

## 2021-10-31 ENCOUNTER — Other Ambulatory Visit: Payer: Self-pay | Admitting: *Deleted

## 2021-10-31 MED ORDER — AMLODIPINE BESYLATE 2.5 MG PO TABS: 2.5000 mg | ORAL_TABLET | Freq: Every day | ORAL | 3 refills | Status: DC

## 2021-11-06 ENCOUNTER — Ambulatory Visit: Payer: Medicare PPO | Admitting: Physician Assistant

## 2021-11-06 ENCOUNTER — Encounter: Payer: Self-pay | Admitting: Physician Assistant

## 2021-11-06 VITALS — BP 161/75 | HR 70 | Ht 69.0 in | Wt 199.0 lb

## 2021-11-06 DIAGNOSIS — G3184 Mild cognitive impairment, so stated: Secondary | ICD-10-CM

## 2021-11-06 DIAGNOSIS — M545 Low back pain, unspecified: Secondary | ICD-10-CM | POA: Diagnosis not present

## 2021-11-06 MED ORDER — DONEPEZIL HCL 10 MG PO TABS
10.0000 mg | ORAL_TABLET | Freq: Every day | ORAL | 3 refills | Status: DC
Start: 2021-11-06 — End: 2021-12-03

## 2021-11-06 NOTE — Patient Instructions (Signed)
It was a pleasure to see you today at our office.   Recommendations:  Follow up in  6  months after the neurocognitive testing Continue donepezil 10 mg daily  Neurocognitive testing in December 2023  Whom to call:  Memory  decline, memory medications: Call our office [phone]   For psychiatric meds, mood meds: Please have your primary care physician manage these medications.   Counseling regarding caregiver distress, including caregiver depression, anxiety and issues regarding community resources, adult day care programs, adult living facilities, or memory care questions:   Feel free to contact [place], Social Worker at [phone]   For assessment of decision of mental capacity and competency:  Call Dr. Anthoney Harada, geriatric psychiatrist at [phone]  For guidance in geriatric dementia issues please call [facility] [phone]  For guidance regarding [facility] and if placement were needed at the facility, contact Arnell Asal, Social Worker tel: [phone]  If you have any severe symptoms of a stroke, or other severe issues such as confusion,severe chills or fever, etc call 911 or go to the ER as you may need to be evaluated further        RECOMMENDATIONS FOR ALL PATIENTS WITH MEMORY PROBLEMS: 1. Continue to exercise (Recommend 30 minutes of walking everyday, or 3 hours every week) 2. Increase social interactions - continue going to [place] and enjoy social gatherings with friends and family 3. Eat healthy, avoid fried foods and eat more fruits and vegetables 4. Maintain adequate blood pressure, blood sugar, and blood cholesterol level. Reducing the risk of stroke and cardiovascular disease also helps promoting better memory. 5. Avoid stressful situations. Live a simple life and avoid aggravations. Organize your time and prepare for the next day in anticipation. 6. Sleep well, avoid any interruptions of sleep and avoid  any distractions in the bedroom that may interfere with adequate sleep quality 7. Avoid sugar, avoid sweets as there is a strong link between excessive sugar intake, diabetes, and cognitive impairment We discussed the Mediterranean diet, which has been shown to help patients reduce the risk of progressive memory disorders and reduces cardiovascular risk. This includes eating fish, eat fruits and green leafy vegetables, nuts like almonds and hazelnuts, walnuts, and also use olive oil. Avoid fast foods and fried foods as much as possible. Avoid sweets and sugar as sugar use has been linked to worsening of memory function.  There is always a concern of gradual progression of memory problems. If this is the case, then we may need to adjust level of care according to patient needs. Support, both to the patient and caregiver, should then be put into place.    FALL PRECAUTIONS: Be cautious when walking. Scan the area for obstacles that may increase the risk of trips and falls. When getting up in the mornings, sit up at the edge of the bed for a few minutes before getting out of bed. Consider elevating the bed at the head end to avoid drop of blood pressure when getting up. Walk always in a well-lit room (use night lights in the walls). Avoid area rugs or power cords from appliances in the middle of the walkways. Use a walker or a cane if necessary and consider physical therapy for balance exercise. Get your eyesight checked regularly.  FINANCIAL OVERSIGHT: Supervision, especially oversight when making financial decisions or transactions is also recommended.  HOME SAFETY: Consider the safety of the kitchen when operating appliances like stoves, microwave oven, and blender. Consider  having supervision and share cooking responsibilities until no longer able to participate in those. Accidents with firearms and other hazards in the house should be identified and addressed as well.   ABILITY TO BE LEFT ALONE: If  patient is unable to contact 911 operator, consider using LifeLine, or when the need is there, arrange for someone to stay with patients. Smoking is a fire hazard, consider supervision or cessation. Risk of wandering should be assessed by caregiver and if detected at any point, supervision and safe proof recommendations should be instituted.  MEDICATION SUPERVISION: Inability to self-administer medication needs to be constantly addressed. Implement a mechanism to ensure safe administration of the medications.   DRIVING: Regarding driving, in patients with progressive memory problems, driving will be impaired. We advise to have someone else do the driving if trouble finding directions or if minor accidents are reported. Independent driving assessment is available to determine safety of driving.   If you are interested in the driving assessment, you can contact the following:  [employer] in [place]  [place] [phone]  [place]  [facility] [phone] or [phone]

## 2021-11-06 NOTE — Progress Notes (Signed)
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 He had been seen one month prior because of significant back discomfort. Last MoCA was 16/30 . Last CT head on 02/17/21  (has PMP) was without any acute abnormalities, ventricles were unremarkable. Today's MMSE is 30/30. Improved from prior. He is on donepezil 10 mg daily, tolerating well   Continue to monitor as he still experiences mild hallucinations.   Need to monitor for her underlying AD or LBD. Continue CPAP use Continue donepezil 10 mg daily.   Repeat Neuropsych evaluation in 12 months  Follow-up in 6 months    Back pain, chronic, worsening at the  Left L4-L5 area, L5-S1 (sciatica) The patient has a history of L4-L5 and L5-S1 spondylosis with possible T spine spondylosis.  He is being followed by orthopedics/. No signs of Parkinson's, and he is able to bilateral equal sensation, normal reflexes.  His pain is reproducible at the  Left L4-5 area. Has been taking tylenol  and antiinflammatories with some relief.    Agree with PT and pain control regimen  Agree with EMG/NCS lower extremities to rule out neuropathy    Case discussed with Dr. Delice Lesch who agrees with the plan     Subjective:    [name] is a very pleasant 71 y.o. RH male  seen today in follow up for memory loss. This patient is accompanied in the office by his wife who supplements the history.  Previous records as well as any outside records available were reviewed prior to todays visit.  Patient was last seen at  our office for memory evaluation on 05/06/21 . MoCA on 11/2020 was 16/30 . Patient is currently on donepezil 10 g daily, tolerating well   Any changes in memory since last visit? "About the same" STM>LTM Patient lives with: Spouse  repeats oneself? Denies  Disoriented when walking into a room?  Patient denies   Leaving objects in unusual places?  Patient denies   Ambulates  with difficulty?   Patient denies   Recent falls?  Patient denies   Any head injuries?  Patient denies   History of seizures?   Patient denies   Wandering behavior?  Patient denies   Patient drives?   Patient no longer drives Any mood changes such irritability agitation?  Patient denies   Any depression?:  Patient denies   Hallucinations?  He sees people, in his bedroom, "they look at me, not fully formed and they disappear and as soon as I walk right by them " but it is not as bad as before  Paranoia?  Patient denies   Patient reports that he sleeps well with vivid dreams,endorses REM behavior, at times he yells in the dreams, denies sleepwalking    History of sleep apnea?Patient uses CPAP Any hygiene concerns?  Patient denies   Independent of bathing and dressing?  Endorsed  Does the patient needs help with medications? Wife in charge  Who is in charge of the finances?  Wife  is in charge   Any changes in appetite?  Patient denies   Patient have trouble swallowing? Patient denies   Does the patient cook?  Patient denies   Any kitchen accidents such as leaving the stove on? Patient denies   Any headaches?  Patient denies   The double vision? Patient denies   Any focal numbness or tingling?  Patient denies   Chronic back pain Patient denies   Unilateral weakness?  Patient denies   Any tremors?  Patient denies   Any history of anosmia?  Patient denies   Any incontinence of urine?  Patient denies   Any bowel dysfunction?   Patient denies      PREVIOUS MEDICATIONS:   CURRENT MEDICATIONS:  Outpatient Encounter  Medications as of 11/06/2021  Medication Sig   ACCU-CHEK GUIDE test strip USE TO CHECK SUGAR 4 TIMES A DAY   amLODipine (NORVASC) 2.5 MG tablet Take 1 tablet (2.5 mg total) by mouth daily.   aspirin (ASPIRIN LOW DOSE) 81 MG EC tablet Take 1 tablet (81 mg total) by mouth daily. Swallow whole.   atorvastatin (LIPITOR) 20 MG tablet TAKE 1 TABLET BY MOUTH DAILY EXCEPT 1/2 TABLET ON TUESDAY, THURSDAY AND SATURDAY.   carvedilol (COREG) 25 MG tablet Take 1 tablet (25 mg total) by mouth 2 (two) times daily with a meal.   digoxin (LANOXIN) 0.125 MG tablet TAKE 1 TABLET EVERY DAY   docusate sodium (COLACE) 100 MG capsule Take 1 capsule (100 mg total) by mouth 2 (two) times daily as needed for mild constipation.   famotidine (PEPCID) 20 MG tablet Take 1 tablet (20 mg total) by mouth daily.   Lancets (ONETOUCH ULTRASOFT) lancets Use as instructed to test sugar daily and prn. Dx E11.9   omeprazole (PRILOSEC) 20 MG capsule TAKE 1 CAPSULE BY MOUTH DAILY. RECOMMENDED TO TAKE 30 MIN BEFORE BREAKFAST. *MAX PER INSURANCE   ramipril (ALTACE) 10 MG capsule TAKE 1 CAPSULE TWICE DAILY   sitaGLIPtin-metformin (JANUMET) 50-1000 MG tablet Take 1 tablet by mouth daily. Take with a meal   vitamin B-12 (CYANOCOBALAMIN) 1000 MCG tablet TAKE 1 TABLET BY MOUTH EVERY DAY   [DISCONTINUED] donepezil (ARICEPT) 10 MG tablet Take 1 tablet (10 mg total) by mouth daily.   [DISCONTINUED] sotalol (BETAPACE) 120 MG tablet TAKE 1 TABLET BY MOUTH TWICE A DAY   donepezil (ARICEPT) 10 MG tablet Take 1 tablet (10 mg total) by mouth daily.   gabapentin (NEURONTIN) 100 MG capsule TAKE 1 CAPSULE BY MOUTH TWICE A DAY (Patient not taking: Reported on 11/06/2021)   No facility-administered encounter medications on file as of 11/06/2021.       11/10/2021    6:00 PM  MMSE - Mini Mental State Exam  Orientation to time 5  Orientation to Place 5  Registration 3  Attention/ Calculation 5  Recall 3  Language- name 2 objects 2  Language- repeat 1   Language- follow 3 step command 3  Language- read & follow direction 1  Write a sentence 1  Copy design 1  Total score 30      12/05/2020   10:00 AM  Montreal Cognitive Assessment   Visuospatial/ Executive (0/5) 3  Naming (0/3) 2  Attention: Read list of digits (0/2) 2  Attention: Read list of letters (0/1) 1  Attention: Serial 7 subtraction starting at 100 (0/3) 1  Language: Repeat phrase (0/2) 0  Language : Fluency (0/1) 0  Abstraction (0/2) 0  Delayed Recall (0/5) 1  Orientation (0/6) 6  Total 16    Objective:     PHYSICAL EXAMINATION:    VITALS:   Vitals:   11/06/21 0850  BP: [name])  161/75  Pulse: 70  SpO2: 99%  Weight: 199 lb (90.3 kg)  Height: '5\' 9"'$  (1.753 m)    GEN:  The patient appears stated age and is in NAD. HEENT:  Normocephalic, atraumatic.   Neurological examination:  General: NAD, well-groomed, appears stated age. Orientation: The patient is alert. Oriented to person, place and date Cranial nerves: There is good facial symmetry.The speech is fluent and clear. No aphasia or dysarthria. Fund of knowledge is appropriate. Recent and remote memory are impaired. Attention and concentration are normal. Able to name objects and repeat phrases. Hearing is intact to conversational tone.    Sensation: Sensation is intact to light touch throughout Motor: Strength is at least antigravity x4. Tremors: none  DTR's 2/4 in UE/LE     Movement examination: Tone: There is normal tone in the UE/LE Abnormal movements:   No myoclonus.  No asterixis.   Coordination:  There is no decremation with RAM's. Normal finger to nose  Gait and Station: The patient has some difficulty arising out of a deep-seated chair without the use of the hands due to back and sciatic pain. The patient's stride length is good.  Gait is cautious and narrow.    Thank you for allowing [place] the opportunity to participate in the care of this nice patient. Please do not hesitate to contact us for any  questions or concerns.   Total time spent on today's visit was 40 minutes dedicated to this patient today, preparing to see patient, examining the patient, ordering tests and/or medications and counseling the patient, documenting clinical information in the EHR or other health record, independently interpreting results and communicating results to the patient/family, discussing treatment and goals, answering patient's questions and coordinating care.  Cc:  Binnie Rail, MD  Sharene Butters 11/10/2021 6:51 PM

## 2021-11-10 ENCOUNTER — Other Ambulatory Visit: Payer: Self-pay | Admitting: Internal Medicine

## 2021-11-13 ENCOUNTER — Encounter: Payer: Self-pay | Admitting: Orthopaedic Surgery

## 2021-11-13 ENCOUNTER — Encounter: Payer: Self-pay | Admitting: Internal Medicine

## 2021-11-19 ENCOUNTER — Ambulatory Visit: Payer: Medicare PPO

## 2021-11-19 ENCOUNTER — Telehealth: Payer: Self-pay

## 2021-11-19 NOTE — Telephone Encounter (Signed)
The patient's back pain has worsened to where he "can barely walk." He is supposed to return to work tomorrow. He said he will try, but he is pretty sure he will not be able to stay there due to the pain. The Tylenol Arthritis he takes does help, but the relief is short-lived.   He is asking for 2 things:   1) an extension on the oow note if he cannot perform his work duties tomorrow &   2) something else to try for the pain. He uses [facility] [place] on [address].   Please advise.

## 2021-11-20 ENCOUNTER — Other Ambulatory Visit: Payer: Self-pay | Admitting: Physician Assistant

## 2021-11-20 ENCOUNTER — Encounter: Payer: Self-pay | Admitting: Pulmonary Disease

## 2021-11-20 ENCOUNTER — Ambulatory Visit: Payer: Medicare PPO | Admitting: Pulmonary Disease

## 2021-11-20 ENCOUNTER — Ambulatory Visit: Payer: Medicare PPO

## 2021-11-20 VITALS — BP 110/60 | HR 65 | Temp 98.1°F | Ht 71.0 in | Wt 190.6 lb

## 2021-11-20 DIAGNOSIS — G4733 Obstructive sleep apnea (adult) (pediatric): Secondary | ICD-10-CM | POA: Diagnosis not present

## 2021-11-20 DIAGNOSIS — G2581 Restless legs syndrome: Secondary | ICD-10-CM | POA: Diagnosis not present

## 2021-11-20 MED ORDER — TRAMADOL HCL 50 MG PO TABS: 50.0000 mg | ORAL_TABLET | Freq: Four times a day (QID) | ORAL | 0 refills | Status: DC | PRN

## 2021-11-20 MED ORDER — GABAPENTIN 100 MG PO CAPS: 100.0000 mg | ORAL_CAPSULE | Freq: Two times a day (BID) | ORAL | 2 refills | Status: DC

## 2021-11-20 NOTE — Telephone Encounter (Signed)
OK for extended OOW note 'until further evaluated"-tramadol for pain

## 2021-11-20 NOTE — Progress Notes (Signed)
   Subjective:    Patient ID: [name], male    DOB: 1950/07/31, 71 y.o.   MRN: [id number]  HPI  71 year old for follow-up of OSA and restless legs  PMH -chronic systolic heart failure status post AICD for VT, hypertension, diabetes type 2, prostate cancer Restless leg syndrome Vascular dementia  OSA was diagnosed in 2005 but he could not tolerate CPAP. He lost 40 pounds since his COVID infection in 2022   Chief Complaint  Patient presents with   Follow-up    Patient says he can't get a cpap mask that doesn't leak.    We reevaluated him with a split-night study which showed a low AHI but high RDI with predominant RERAs.  So asked him to continue CPAP. Wife complains of large leak, uses nasal pillows.  He denies any problems with pressure.  He has been able to use his CPAP more.  He feels better rested in the daytime but wife reports that he still napping.  His memory issues have persisted. He was taking gabapentin twice daily, not sure if this helped his legs   Significant tests/ events reviewed  08/2021 split-night study mild OSA AHI 9.5/hour, RDI 50/hour, minimum desaturation 90%  01/2004 NPSG - wt 235 lbs -RDI 37/hour corrected by CPAP 7 cm  Review of Systems  neg for any significant sore throat, dysphagia, itching, sneezing, nasal congestion or excess/ purulent secretions, fever, chills, sweats, unintended wt loss, pleuritic or exertional cp, hempoptysis, orthopnea pnd or change in chronic leg swelling. Also denies presyncope, palpitations, heartburn, abdominal pain, nausea, vomiting, diarrhea or change in bowel or urinary habits, dysuria,hematuria, rash, arthralgias, visual complaints, headache, numbness weakness or ataxia.     Objective:   Physical Exam  Gen. Pleasant, well-nourished, in no distress ENT - no thrush, no pallor/icterus,no post nasal drip Neck: No JVD, no thyromegaly, no carotid bruits Lungs: no use of accessory muscles, no dullness to percussion, clear  without rales or rhonchi  Cardiovascular: Rhythm regular, heart sounds  normal, no murmurs or gallops, no peripheral edema Musculoskeletal: No deformities, no cyanosis or clubbing        Assessment & Plan:

## 2021-11-20 NOTE — Telephone Encounter (Signed)
Spoke with patient and wife. New work note is up front. And tramadol has been sent to pharmacy.

## 2021-11-20 NOTE — Patient Instructions (Signed)
   X Rx for gabapentin 100 mg tablets .  Take 1 tablet at bedtime.  If no effect in 1 week, increase to 2 tablets at bedtime  # 60 x 2 refills  Iron levels was okay  X Rx for nasal pillows Decrease auto CPAP 5 to 10 cm

## 2021-11-21 ENCOUNTER — Ambulatory Visit (INDEPENDENT_AMBULATORY_CARE_PROVIDER_SITE_OTHER): Payer: Medicare PPO

## 2021-11-21 DIAGNOSIS — Z0000 Encounter for general adult medical examination without abnormal findings: Secondary | ICD-10-CM | POA: Diagnosis not present

## 2021-11-21 NOTE — Patient Instructions (Signed)
[name] , Thank you for taking time to come for your Medicare Wellness Visit. I appreciate your ongoing commitment to your health goals. Please review the following plan we discussed and let me know if I can assist you in the future.   Screening recommendations/referrals: Colonoscopy: 11/07/2019; due every 7 years Recommended yearly ophthalmology/optometry visit for glaucoma screening and checkup Recommended yearly dental visit for hygiene and checkup  Vaccinations: Influenza vaccine: 01/15/2021 Pneumococcal vaccine: 12/14/2016, 12/15/2017 Tdap vaccine: 11/29/2012; due every 10 years Shingles vaccine: 01/09/2018; need second dose   Covid-19: 05/24/2019, 06/13/2019, 01/23/2020, 09/30/2020, 02/04/2021  Advanced directives: Yes; Please bring a copy of your health care power of attorney and living will to the office at your convenience.  Conditions/risks identified: Yes; Type II Diabetes Mellistus  Next appointment: Please schedule your next Medicare Wellness Visit with your Nurse Health Advisor in 1 year by calling [phone].  Preventive Care 70 Years and Older, Male Preventive care refers to lifestyle choices and visits with your health care provider that can promote health and wellness. What does preventive care include? A yearly physical exam. This is also called an annual well check. Dental exams once or twice a year. Routine eye exams. Ask your health care provider how often you should have your eyes checked. Personal lifestyle choices, including: Daily care of your teeth and gums. Regular physical activity. Eating a healthy diet. Avoiding tobacco and drug use. Limiting alcohol use. Practicing safe sex. Taking low doses of aspirin every day. Taking vitamin and mineral supplements as recommended by your health care provider. What happens during an annual well check? The services and screenings done by your health care provider during your annual well check will depend on your age, overall  health, lifestyle risk factors, and family history of disease. Counseling  Your health care provider may ask you questions about your: Alcohol use. Tobacco use. Drug use. Emotional well-being. Home and relationship well-being. Sexual activity. Eating habits. History of falls. Memory and ability to understand (cognition). Work and work Statistician. Screening  You may have the following tests or measurements: Height, weight, and BMI. Blood pressure. Lipid and cholesterol levels. These may be checked every 5 years, or more frequently if you are over 20 years old. Skin check. Lung cancer screening. You may have this screening every year starting at age 98 if you have a 30-pack-year history of smoking and currently smoke or have quit within the past 15 years. Fecal occult blood test (FOBT) of the stool. You may have this test every year starting at age 63. Flexible sigmoidoscopy or colonoscopy. You may have a sigmoidoscopy every 5 years or a colonoscopy every 10 years starting at age 90. Prostate cancer screening. Recommendations will vary depending on your family history and other risks. Hepatitis C blood test. Hepatitis B blood test. Sexually transmitted disease (STD) testing. Diabetes screening. This is done by checking your blood sugar (glucose) after you have not eaten for a while (fasting). You may have this done every 1-3 years. Abdominal aortic aneurysm (AAA) screening. You may need this if you are a current or former smoker. Osteoporosis. You may be screened starting at age 80 if you are at high risk. Talk with your health care provider about your test results, treatment options, and if necessary, the need for more tests. Vaccines  Your health care provider may recommend certain vaccines, such as: Influenza vaccine. This is recommended every year. Tetanus, diphtheria, and acellular pertussis (Tdap, Td) vaccine. You may need a Td booster  every 10 years. Zoster vaccine. You may  need this after age 19. Pneumococcal 13-valent conjugate (PCV13) vaccine. One dose is recommended after age 76. Pneumococcal polysaccharide (PPSV23) vaccine. One dose is recommended after age 66. Talk to your health care provider about which screenings and vaccines you need and how often you need them. This information is not intended to replace advice given to you by your health care provider. Make sure you discuss any questions you have with your health care provider. Document Released: 05/24/2015 Document Revised: 01/15/2016 Document Reviewed: 02/26/2015 Elsevier Interactive Patient Education  2017 [place] Prevention in the Home Falls can cause injuries. They can happen to people of all ages. There are many things you can do to make your home safe and to help prevent falls. What can I do on the outside of my home? Regularly fix the edges of walkways and driveways and fix any cracks. Remove anything that might make you trip as you walk through a door, such as a raised step or threshold. Trim any bushes or trees on the path to your home. Use bright outdoor lighting. Clear any walking paths of anything that might make someone trip, such as rocks or tools. Regularly check to see if handrails are loose or broken. Make sure that both sides of any steps have handrails. Any raised decks and porches should have guardrails on the edges. Have any leaves, snow, or ice cleared regularly. Use sand or salt on walking paths during winter. Clean up any spills in your garage right away. This includes oil or grease spills. What can I do in the bathroom? Use night lights. Install grab bars by the toilet and in the tub and shower. Do not use towel bars as grab bars. Use non-skid mats or decals in the tub or shower. If you need to sit down in the shower, use a plastic, non-slip stool. Keep the floor dry. Clean up any water that spills on the floor as soon as it happens. Remove soap buildup in  the tub or shower regularly. Attach bath mats securely with double-sided non-slip rug tape. Do not have throw rugs and other things on the floor that can make you trip. What can I do in the bedroom? Use night lights. Make sure that you have a light by your bed that is easy to reach. Do not use any sheets or blankets that are too big for your bed. They should not hang down onto the floor. Have a firm chair that has side arms. You can use this for support while you get dressed. Do not have throw rugs and other things on the floor that can make you trip. What can I do in the kitchen? Clean up any spills right away. Avoid walking on wet floors. Keep items that you use a lot in easy-to-reach places. If you need to reach something above you, use a strong step stool that has a grab bar. Keep electrical cords out of the way. Do not use floor polish or wax that makes floors slippery. If you must use wax, use non-skid floor wax. Do not have throw rugs and other things on the floor that can make you trip. What can I do with my stairs? Do not leave any items on the stairs. Make sure that there are handrails on both sides of the stairs and use them. Fix handrails that are broken or loose. Make sure that handrails are as long as the stairways. Check any carpeting to  make sure that it is firmly attached to the stairs. Fix any carpet that is loose or worn. Avoid having throw rugs at the top or bottom of the stairs. If you do have throw rugs, attach them to the floor with carpet tape. Make sure that you have a light switch at the top of the stairs and the bottom of the stairs. If you do not have them, ask someone to add them for you. What else can I do to help prevent falls? Wear shoes that: Do not have high heels. Have rubber bottoms. Are comfortable and fit you well. Are closed at the toe. Do not wear sandals. If you use a stepladder: Make sure that it is fully opened. Do not climb a closed  stepladder. Make sure that both sides of the stepladder are locked into place. Ask someone to hold it for you, if possible. Clearly mark and make sure that you can see: Any grab bars or handrails. First and last steps. Where the edge of each step is. Use tools that help you move around (mobility aids) if they are needed. These include: Canes. Walkers. Scooters. Crutches. Turn on the lights when you go into a dark area. Replace any light bulbs as soon as they burn out. Set up your furniture so you have a clear path. Avoid moving your furniture around. If any of your floors are uneven, fix them. If there are any pets around you, be aware of where they are. Review your medicines with your doctor. Some medicines can make you feel dizzy. This can increase your chance of falling. Ask your doctor what other things that you can do to help prevent falls. This information is not intended to replace advice given to you by your health care provider. Make sure you discuss any questions you have with your health care provider. Document Released: 02/21/2009 Document Revised: 10/03/2015 Document Reviewed: 06/01/2014 Elsevier Interactive Patient Education  2017 [employer].

## 2021-11-21 NOTE — Progress Notes (Addendum)
I connected with [name] today by telephone and verified that I am speaking with the correct person using two identifiers. Location patient: home Location provider: work Persons participating in the virtual visit: patient, provider.   I discussed the limitations, risks, security and privacy concerns of performing an evaluation and management service by telephone and the availability of in person appointments. I also discussed with the patient that there may be a patient responsible charge related to this service. The patient expressed understanding and verbally consented to this telephonic visit.    Interactive audio and video telecommunications were attempted between this provider and patient, however failed, due to patient having technical difficulties OR patient did not have access to video capability.  We continued and completed visit with audio only.  Some vital signs may be absent or patient reported.   Time Spent with patient on telephone encounter: 30 minutes  Subjective:   [name] is a 71 y.o. male who presents for Medicare Annual/Subsequent preventive examination.  Review of Systems     Cardiac Risk Factors include: advanced age (>59mn, >>54women);diabetes mellitus;dyslipidemia;family history of premature cardiovascular disease;hypertension;male gender     Objective:    There were no vitals filed for this visit. There is no height or weight on file to calculate BMI.     11/21/2021   11:37 AM 11/06/2021    8:52 AM 09/03/2021    8:53 PM 05/06/2021    7:30 AM 12/05/2020    9:24 AM 11/14/2020    9:42 AM 10/18/2019    8:25 AM  Advanced Directives  Does Patient Have a Medical Advance Directive? Yes Yes Yes No No No No  Type of AParamedicof [place] will;Out of facility DNR (pink MOST or yellow form) [place];Out of facility DNR (pink MOST or yellow form) [place] will      Does  patient want to make changes to medical advance directive?   No - Patient declined      Copy of HF[place]in Chart? No - copy requested  No - copy requested      Would patient like information on creating a medical advance directive?      No - Patient declined Yes (MAU/Ambulatory/Procedural Areas - Information given)    Current Medications (verified) Outpatient Encounter Medications as of 11/21/2021  Medication Sig   ACCU-CHEK GUIDE test strip USE TO CHECK SUGAR 4 TIMES A DAY   amLODipine (NORVASC) 2.5 MG tablet Take 1 tablet (2.5 mg total) by mouth daily.   aspirin (ASPIRIN LOW DOSE) 81 MG EC tablet Take 1 tablet (81 mg total) by mouth daily. Swallow whole.   atorvastatin (LIPITOR) 20 MG tablet TAKE 1 TABLET BY MOUTH DAILY EXCEPT 1/2 TABLET ON TUESDAY, THURSDAY AND SATURDAY.   carvedilol (COREG) 25 MG tablet Take 1 tablet (25 mg total) by mouth 2 (two) times daily with a meal.   digoxin (LANOXIN) 0.125 MG tablet TAKE 1 TABLET EVERY DAY   docusate sodium (COLACE) 100 MG capsule Take 1 capsule (100 mg total) by mouth 2 (two) times daily as needed for mild constipation.   donepezil (ARICEPT) 10 MG tablet Take 1 tablet (10 mg total) by mouth daily.   famotidine (PEPCID) 20 MG tablet Take 1 tablet (20 mg total) by mouth daily.   gabapentin (NEURONTIN) 100 MG capsule Take 1 capsule (100 mg total) by mouth 2 (two) times daily.   Lancets (ONETOUCH ULTRASOFT) lancets Use  as instructed to test sugar daily and prn. Dx E11.9   omeprazole (PRILOSEC) 20 MG capsule TAKE 1 CAPSULE BY MOUTH DAILY. RECOMMENDED TO TAKE 30 MIN BEFORE BREAKFAST. *MAX PER INSURANCE   ramipril (ALTACE) 10 MG capsule TAKE 1 CAPSULE TWICE DAILY   sitaGLIPtin-metformin (JANUMET) 50-1000 MG tablet Take 1 tablet by mouth daily. Take with a meal   sotalol (BETAPACE) 120 MG tablet TAKE 1 TABLET TWICE DAILY   traMADol (ULTRAM) 50 MG tablet Take 1 tablet (50 mg total) by mouth every 6 (six) hours as needed.   vitamin B-12  (CYANOCOBALAMIN) 1000 MCG tablet TAKE 1 TABLET BY MOUTH EVERY DAY   No facility-administered encounter medications on file as of 11/21/2021.    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   Not compatible with MRI    Barrett's esophagus 09/05/2004   2 cm changes seen at index EGD   Cataract    Chronic back pain 52/84/1324   Chronic systolic heart failure    Colon polyps    tubular adenoma   Dysrhythmia    Essential hypertension 08/26/2009   Fatigue 12/02/2020   Generalized anxiety disorder    since defibrillator placement   GERD (gastroesophageal reflux disease)    Hip pain 12/14/2016   History of COVID-19    History of myocardial infarction    Hyperlipidemia    Left knee pain 12/14/2016   Malignant neoplasm of prostate 09/27/2014   s/p prostatectomy   Mild anemia 01/20/2011   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 b. s/p Medtronic ICD.   Obstructive sleep apnea 11/10/2012   no CPAP use   Right foot pain 12/15/2017   RLS (restless legs syndrome) 11/29/2012   S/P radiation therapy    10/10/2014 through 11/26/2014; Prostate bed 6600 cGy in 33 sessions   Shingles 07/28/2016   Type II diabetes mellitus 08/26/2009   Ventricular tachycardia ([facility])    a. s/p ICD (generator change 09/2012). b. 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 and placed on sotalol   Vitamin B 12 deficiency 03/06/2011   Monthly B12 shots initiated  2011. Nasally  inhaled B12 as of 07/2012   Past Surgical History:  Procedure Laterality Date   CARDIAC DEFIBRILLATOR PLACEMENT  03/11/2004   Medtronic Maximo single lead   COLONOSCOPY W/ BIOPSIES AND POLYPECTOMY  08/2004, 02/24/11   85m adenoma in 2006,  5 mm polyp (not recovered) 2012   [place]/A 10/08/2015   Procedure: CYSTOSCOPY WITH DIRECT VISION INTERNAL URETHROTOMY;  Surgeon: BNickie Retort MD;  Location: [facility];  Service: Urology;  Laterality: N/A;   IMPLANTABLE CARDIOVERTER DEFIBRILLATOR GENERATOR CHANGE N/A 10/07/2012   Procedure: IMPLANTABLE CARDIOVERTER DEFIBRILLATOR GENERATOR CHANGE;  Surgeon: GEvans Lance MD;  [facility];  Service: Cardiovascular;  Laterality: N/A;   PROSTATECTOMY  06/2003   Dr NJanice Norrie  RECTAL SURGERY     UPPER GASTROINTESTINAL ENDOSCOPY  08/2004, 02/24/11   Barrett's esophagus   Family History  Problem Relation Age of Onset   Dementia Mother    Hypertension Mother    Coronary artery disease Father    Prostate cancer Father    Diabetes Father    Dementia Sister    Dementia Brother  Diabetes Paternal Grandmother    Dementia Maternal Aunt    Stroke Neg Hx    Colon cancer Neg Hx    Colon polyps Neg Hx    Esophageal cancer Neg Hx    Rectal cancer Neg Hx    Stomach cancer Neg Hx    Social History   Socioeconomic History   Marital status: Married    Spouse name: Not on file   Number of children: 2   Years of education: 16   Highest education level: Bachelor's degree (e.g., BA, AB, BS)  Occupational History   Occupation: Therapist, sports: [employer]  Tobacco Use   Smoking status: Never   Smokeless tobacco: Never  Vaping Use   Vaping Use: Never used  Substance and Sexual Activity   Alcohol use: No   Drug use: No   Sexual activity: Not on file  Other Topics Concern   Not on file  Social History Narrative   Right handed   No caffeine   Two story home   Social Determinants of Health   Financial Resource Strain: Low Risk  (11/21/2021)   Overall Financial Resource Strain (CARDIA)    Difficulty of Paying Living Expenses: Not hard at all  Food Insecurity: No Food Insecurity (11/21/2021)   Hunger Vital Sign    Worried About Running Out of Food in the Last Year: Never  true    Ran Out of Food in the Last Year: Never true  Transportation Needs: No Transportation Needs (11/21/2021)   PRAPARE - Hydrologist (Medical): No    Lack of Transportation (Non-Medical): No  Physical Activity: Sufficiently Active (11/21/2021)   Exercise Vital Sign    Days of Exercise per Week: 5 days    Minutes of Exercise per Session: 60 min  Stress: No Stress Concern Present (11/21/2021)   [place]    Feeling of Stress : Not at all  Social Connections: [place] (11/21/2021)   Social Connection and Isolation Panel [NHANES]    Frequency of Communication with Friends and Family: More than three times a week    Frequency of Social Gatherings with Friends and Family: More than three times a week    Attends Religious Services: More than 4 times per year    Active Member of [employer] or Organizations: Yes    Attends Music therapist: More than 4 times per year    Marital Status: Married    Tobacco Counseling Counseling given: Not Answered   Clinical Intake:  Pre-visit preparation completed: Yes  Pain : No/denies pain     BMI - recorded: 29.39 Nutritional Status: BMI > 30  Obese Nutritional Risks: None Diabetes: Yes CBG done?: No Did pt. bring in CBG monitor from home?: No  How often do you need to have someone help you when you read instructions, pamphlets, or other written materials from your doctor or pharmacy?: 1 - Never What is the last grade level you completed in school?: Bachelor's Degree  Diabetic? yes  Interpreter Needed?: No  Information entered by :: Lisette Abu, LPN.   Activities of Daily Living    11/21/2021   11:44 AM  In your present state of health, do you have any difficulty performing the following activities:  Hearing? 0  Vision? 0  Difficulty concentrating or making decisions? 0  Walking or climbing stairs? 0  Dressing or  bathing? 0  Doing errands, shopping? 0  Preparing Food and eating ? N  Using the Toilet? N  In the past six months, have you accidently leaked urine? N  Do you have problems with loss of bowel control? N  Managing your Medications? N  Managing your Finances? N  Housekeeping or managing your Housekeeping? N    Patient Care Team: Binnie Rail, MD as PCP - General (Internal Medicine) Ardis Hughs, MD as Attending Physician (Urology) Gatha Mayer, MD as Consulting Physician (Gastroenterology) Szabat, Darnelle Maffucci, [facility] (Inactive) as Pharmacist (Pharmacist) Evans Lance, MD as Consulting Physician (Cardiology)  Indicate any recent Medical Services you may have received from other than [facility] providers in the past year (date may be approximate).     Assessment:   This is a routine wellness examination for [name].  Hearing/Vision screen Hearing Screening - Comments:: Patient denied any hearing difficulty.   No hearing aids.   Vision Screening - Comments:: Patient does wear corrective lenses/contacts.  Eye exam done by: Warden Fillers, MD.   Dietary issues and exercise activities discussed: Current Exercise Habits: The patient has a physically strenuous job, but has no regular exercise apart from work., Type of exercise: walking, Exercise limited by: orthopedic condition(s)   Goals Addressed   None   Depression Screen    11/21/2021   11:44 AM 11/14/2020    9:41 AM 10/18/2019    8:25 AM 08/24/2019   11:17 AM 06/17/2017   10:42 AM 06/17/2017    9:19 AM 06/16/2016   11:25 AM  PHQ 2/9 Scores  PHQ - 2 Score 0 0 0 0 1 0 0  PHQ- 9 Score     3      Fall Risk    11/21/2021   11:38 AM 11/06/2021    8:51 AM 05/06/2021    7:30 AM 12/05/2020    9:24 AM 11/14/2020    9:43 AM  Fall Risk   Falls in the past year? 0 0 0 0 0  Number falls in past yr: 0 0 0 0 0  Injury with Fall? 0 0 0 0 0  Risk for fall due to : No Fall Risks    No Fall Risks  Follow up Falls evaluation completed     Falls evaluation completed    [place]:  Any stairs in or around the home? Yes  If so, are there any without handrails? No  Home free of loose throw rugs in walkways, pet beds, electrical cords, etc? Yes  Adequate lighting in your home to reduce risk of falls? Yes   ASSISTIVE DEVICES UTILIZED TO PREVENT FALLS:  Life alert? No  Use of a cane, walker or w/c? No  Grab bars in the bathroom? No  Shower chair or bench in shower? No  Elevated toilet seat or a handicapped toilet? No   TIMED UP AND GO:  Was the test performed? No .  Length of time to ambulate 10 feet: n/a sec.   Appearance of gait: Gait not evaluated during this visit.  Cognitive Function:    11/10/2021    6:00 PM  MMSE - Mini Mental State Exam  Orientation to time 5  Orientation to Place 5  Registration 3  Attention/ Calculation 5  Recall 3  Language- name 2 objects 2  Language- repeat 1  Language- follow 3 step command 3  Language- read & follow direction 1  Write a sentence 1  Copy design 1  Total score 30  12/05/2020   10:00 AM  Montreal Cognitive Assessment   Visuospatial/ Executive (0/5) 3  Naming (0/3) 2  Attention: Read list of digits (0/2) 2  Attention: Read list of letters (0/1) 1  Attention: Serial 7 subtraction starting at 100 (0/3) 1  Language: Repeat phrase (0/2) 0  Language : Fluency (0/1) 0  Abstraction (0/2) 0  Delayed Recall (0/5) 1  Orientation (0/6) 6  Total 16      11/21/2021   11:47 AM 10/18/2019    8:26 AM  6CIT Screen  What Year? 0 points 0 points  What month? 0 points 0 points  What time? 0 points 0 points  Count back from 20 0 points 0 points  Months in reverse 0 points 0 points  Repeat phrase 0 points 0 points  Total Score 0 points 0 points    Immunizations Immunization History  Administered Date(s) Administered   Fluad Quad(high Dose 65+) 02/06/2019, 01/15/2021   Influenza Split 03/10/2012   Influenza, High Dose Seasonal  PF 06/16/2016, 06/17/2017   Influenza, Seasonal, Injecte, Preservative Fre 03/10/2014   Influenza,inj,Quad PF,6+ Mos 03/02/2013   Influenza,trivalent, recombinat, inj, PF 01/09/2018   PFIZER Comirnaty(Gray Top)Covid-19 Tri-Sucrose Vaccine 09/30/2020   PFIZER(Purple Top)SARS-COV-2 Vaccination 05/24/2019, 06/13/2019, 01/23/2020   Pfizer Covid-19 Vaccine Bivalent Booster 89yr & up 02/04/2021   Pneumococcal Conjugate-13 12/14/2016   Pneumococcal Polysaccharide-23 12/15/2017   Tdap 11/29/2012   Zoster Recombinat (Shingrix) 01/09/2018    TDAP status: Up to date  Flu Vaccine status: Up to date  Pneumococcal vaccine status: Up to date  Covid-19 vaccine status: Completed vaccines  Qualifies for Shingles Vaccine? Yes   Zostavax completed No   Shingrix Completed?: No.    Education has been provided regarding the importance of this vaccine. Patient has been advised to call insurance company to determine out of pocket expense if they have not yet received this vaccine. Advised may also receive vaccine at local pharmacy or Health Dept. Verbalized acceptance and understanding.  Screening Tests Health Maintenance  Topic Date Due   Zoster Vaccines- Shingrix (2 of 2) 03/06/2018   FOOT EXAM  10/16/2020   OPHTHALMOLOGY EXAM  04/09/2021   INFLUENZA VACCINE  12/09/2021   HEMOGLOBIN A1C  02/04/2022   Diabetic kidney evaluation - GFR measurement  08/05/2022   Diabetic kidney evaluation - Urine ACR  08/05/2022   TETANUS/TDAP  11/30/2022   COLONOSCOPY (Pts 45-448yrInsurance coverage will need to be confirmed)  11/07/2026   Pneumonia Vaccine 6532Years old  Completed   COVID-19 Vaccine  Completed   Hepatitis C Screening  Completed   HPV VACCINES  Aged Out    Health Maintenance  Health Maintenance Due  Topic Date Due   Zoster Vaccines- Shingrix (2 of 2) 03/06/2018   FOOT EXAM  10/16/2020   OPHTHALMOLOGY EXAM  04/09/2021    Colorectal cancer screening: Type of screening: Colonoscopy.  Completed 11/07/2019. Repeat every 7 years  Lung Cancer Screening: (Low Dose CT Chest recommended if Age 70-80ears, 30 pack-year currently smoking OR have quit w/in 15years.) does not qualify.   Lung Cancer Screening Referral: no  Additional Screening:  Hepatitis C Screening: does qualify; Completed 08/05/2015  Vision Screening: Recommended annual ophthalmology exams for early detection of glaucoma and other disorders of the eye. Is the patient up to date with their annual eye exam?  Yes  Who is the provider or what is the name of the office in which the patient attends annual eye exams? ChWarden FillersMD. If pt  is not established with a provider, would they like to be referred to a provider to establish care? No .   Dental Screening: Recommended annual dental exams for proper oral hygiene  Community Resource Referral / Chronic Care Management: CRR required this visit?  No   CCM required this visit?  No      Plan:     I have personally reviewed and noted the following in the patient's chart:   Medical and social history Use of alcohol, tobacco or illicit drugs  Current medications and supplements including opioid prescriptions. Patient is not currently taking opioid prescriptions. Functional ability and status Nutritional status Physical activity Advanced directives List of other physicians Hospitalizations, surgeries, and ER visits in previous 12 months Vitals Screenings to include cognitive, depression, and falls Referrals and appointments  In addition, I have reviewed and discussed with patient certain preventive protocols, quality metrics, and best practice recommendations. A written personalized care plan for preventive services as well as general preventive health recommendations were provided to patient.     Sheral Flow, LPN   05/20/2109   Nurse Notes:  Patient has current diagnosis of cognitive impairment. Patient is followed by neurology for ongoing  assessment.   Medical screening examination/treatment/procedure(s) were performed by non-physician practitioner and as supervising physician I was immediately available for consultation/collaboration.  I agree with above. Lew Dawes, MD

## 2021-11-22 NOTE — Assessment & Plan Note (Addendum)
CPAP download was reviewed which shows better compliance more than 5 hours on average, average pressure of 8.5 cm on auto settings 5 to 15 cm maximum pressure of 10. We will change to auto settings 5 to 10 cm.  He is compliant and CPAP certainly seems to be helping him so we will continue. I given him a prescription for nasal pillows  Weight loss encouraged, compliance with goal of at least 4-6 hrs every night is the expectation. Advised against medications with sedative side effects Cautioned against driving when sleepy - understanding that sleepiness will vary on a day to day basis

## 2021-11-22 NOTE — Assessment & Plan Note (Signed)
Iron levels were okay in the past. I will give him a prescription for gabapentin to use up to 200 mg at bedtime and hopefully this will help both his sleep and his leg symptoms

## 2021-12-01 ENCOUNTER — Other Ambulatory Visit: Payer: Self-pay | Admitting: Student

## 2021-12-01 ENCOUNTER — Other Ambulatory Visit: Payer: Self-pay | Admitting: Internal Medicine

## 2021-12-01 ENCOUNTER — Telehealth: Payer: Self-pay | Admitting: Internal Medicine

## 2021-12-01 NOTE — Progress Notes (Unsigned)
Cardiology Office Note Date:  12/01/2021  Patient ID:  [name], [name] 1950/07/09, MRN [id number] PCP:  Binnie Rail, MD  Cardiologist:  Dr. Lovena Le     Chief Complaint: recurrent near syncope  History of Present Illness: [name] is a 71 y.o. male with history of anxiety, arthritis, GERD, HTN, HLD, NICM, chronic CHF (systolic), VT, ICD, OSA intolerant of CPAP  He comes in today to be seen for Dr. Lovena Le, last seen by him jan 2023, , back at work, had lost some weight, no sustained VT/therapies.  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 Elsworth Soho 11/20/21, working on getting a CPAP mask that won't leak/that he can tolerate  Called 12/01/21 with what sounds like perhaps a couple months of near syncope? That he will put cold compress/ice on the back of his neck or sit in the cooler at work for a bit that helps   TODAY He is accompanied by his wife He reports a couple months of dizziness upon standing sometimes to the point of near syncope, this has been going on a few months, by his report, his wife recalls episodes started back in October. They both agree that the frequency and severity though are both increasing  in the last 2 weeks to every time he stands up he gets lightheaded, and that the recovery is taking longer He has not worked on a couple weeks, getting lightheaded so often.  He is very clear that the dizziness/lightheaded weak spells ONLY happen from sitting to standing, and as he gets up and starts to walk he is very weak, unsteady, feels hot, and has to sit down to feel better, sometimes will seek out cooler places like the cooler at work. Though does not have to be a hot environment, feels hot and looks to sit down and cool off.  He has not fainted or fallen, but has stumbled.  His wife points out that he also has a gait instability that pre-dates the orthostatic symptoms and they report  that they have discussed this with neurology who felt perhaps a back problem?  He is seeing orthopedics with arthritic in back and pending further eval.  NO CP, NO SOB, NO palpitations with or without the symptoms of dizziness/weakness   Device information MDT single chamber ICD, current system implanted 10/07/2012 Initial device implant was ?2005, 6949 lead was capped/abandoned AAD 2012, VT storm, numerous shocks, started on sotalol  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   Not compatible with MRI    Barrett's esophagus 09/05/2004   2 cm changes seen at index EGD   Cataract    Chronic back pain 53/66/4403   Chronic systolic heart failure    Colon polyps    tubular adenoma   Dysrhythmia    Essential hypertension 08/26/2009   Fatigue 12/02/2020   Generalized anxiety disorder    since defibrillator placement   GERD (gastroesophageal reflux disease)    Hip pain 12/14/2016   History of COVID-19    History of myocardial infarction    Hyperlipidemia    Left knee pain 12/14/2016   Malignant neoplasm of prostate 09/27/2014   s/p prostatectomy   Mild anemia 01/20/2011   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 b. s/p Medtronic ICD.   Obstructive sleep apnea 11/10/2012   no CPAP use   Right foot pain 12/15/2017   RLS (restless legs syndrome) 11/29/2012   S/P radiation therapy    10/10/2014 through 11/26/2014; Prostate bed 6600 cGy in 33 sessions   Shingles 07/28/2016   Type II diabetes mellitus 08/26/2009   Ventricular tachycardia ([place])    a. s/p ICD (generator change 09/2012). b. 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 and placed on sotalol   Vitamin B 12 deficiency 03/06/2011   Monthly B12 shots initiated  2011. Nasally  inhaled B12 as of 07/2012    Past Surgical  History:  Procedure Laterality Date   CARDIAC DEFIBRILLATOR PLACEMENT  03/11/2004   Medtronic Maximo single lead   COLONOSCOPY W/ BIOPSIES AND POLYPECTOMY  08/2004, 02/24/11   51m adenoma in 2006,  5 mm polyp (not recovered) 2012   [place] 10[place]   Procedure: CYSTOSCOPY WITH DIRECT VISION INTERNAL URETHROTOMY;  Surgeon: BNickie Retort MD;  Location: [facility];  Service: Urology;  Laterality: N/A;   IMPLANTABLE CARDIOVERTER DEFIBRILLATOR GENERATOR CHANGE N/A 10/07/2012   Procedure: IMPLANTABLE CARDIOVERTER DEFIBRILLATOR GENERATOR CHANGE;  Surgeon: GEvans Lance MD;  [facility];  Service: Cardiovascular;  Laterality: N/A;   PROSTATECTOMY  06/2003   Dr NJanice Norrie  RECTAL SURGERY     UPPER GASTROINTESTINAL ENDOSCOPY  08/2004, 02/24/11   Barrett's esophagus    Current Outpatient Medications  Medication Sig Dispense Refill   ACCU-CHEK GUIDE test strip USE TO CHECK SUGAR 4 TIMES A DAY 100 strip 4   amLODipine (NORVASC) 2.5 MG tablet Take 1 tablet (2.5 mg total) by mouth daily. 90 tablet 3   aspirin (ASPIRIN LOW DOSE) 81 MG EC tablet Take 1 tablet (81 mg total) by mouth daily. Swallow whole. 90 tablet 3   atorvastatin (LIPITOR) 20 MG tablet TAKE 1 TABLET BY MOUTH DAILY EXCEPT 1/2 TABLET ON TUESDAY, THURSDAY AND SATURDAY. 24 tablet 8   carvedilol (COREG) 25 MG tablet TAKE 1 TABLET TWICE DAILY WITH MEALS 180 tablet 1   digoxin (LANOXIN) 0.125 MG tablet TAKE 1 TABLET EVERY DAY 30 tablet 2   docusate sodium (COLACE) 100 MG capsule Take 1 capsule (100 mg total) by mouth 2 (two) times daily as needed for mild constipation. 60 capsule 2   donepezil (ARICEPT) 10 MG tablet Take 1 tablet (10 mg total) by mouth daily. 90 tablet 3   famotidine (PEPCID) 20 MG tablet Take 1 tablet (20 mg total) by mouth daily. 90 tablet 3   gabapentin (NEURONTIN) 100 MG capsule Take 1 capsule (100 mg total) by mouth 2 (two) times daily. 60 capsule 2   Lancets (ONETOUCH  ULTRASOFT) lancets Use as instructed to test sugar daily and prn. Dx E11.9 100 each 12   omeprazole (PRILOSEC) 20 MG capsule TAKE 1 CAPSULE DAILY 30 MINUTES BEFORE BREAKFAST 90 capsule 1   ramipril (ALTACE) 10 MG capsule TAKE 1 CAPSULE TWICE DAILY 60 capsule 5   sitaGLIPtin-metformin (JANUMET) 50-1000 MG tablet Take 1 tablet by mouth daily. Take with a meal 30 tablet 8   sotalol (BETAPACE) 120 MG tablet TAKE 1 TABLET TWICE DAILY 120 tablet 2   traMADol (ULTRAM) 50 MG tablet Take 1 tablet (50 mg total) by mouth every 6 (six) hours as needed. 30 tablet 0   vitamin B-12 (CYANOCOBALAMIN) 1000 MCG tablet TAKE 1 TABLET EVERY DAY 90 tablet 1   No current facility-administered medications for this visit.  Allergies:   Zantac [ranitidine hcl]   Social History:  The patient  reports that he has never smoked. He has never used smokeless tobacco. He reports that he does not drink alcohol and does not use drugs.   Family History:  The patient's family history includes Coronary artery disease in his father; Dementia in his brother, maternal aunt, mother, and sister; Diabetes in his father and paternal grandmother; Hypertension in his mother; Prostate cancer in his father.  ROS:  Please see the history of present illness.  All other systems are reviewed and otherwise negative.   PHYSICAL EXAM:  VS:  There were no vitals taken for this visit. BMI: There is no height or weight on file to calculate BMI. Well nourished, well developed, in no acute distress  HEENT: normocephalic, atraumatic  Neck: no JVD, carotid bruits or masses Cardiac:  RRR; no significant murmurs, no rubs, or gallops Lungs:  CTA b/l, no wheezing, rhonchi or rales  Abd: soft, nontender, obese MS: no deformity or atrophy Ext:  noedema  Skin: warm and dry, no rash Neuro:  No gross deficits appreciated Psych: euthymic mood, full affect  ICD site is stable, no tethering or discomfort   EKG:  Done today and reviewed by myself;   SR  69 bpm, QTc 437, T changes inf/lat, lateral changes have been seen in older EKGs  ICD interrogation done today and reviewed by myself;  Battery and lead measurements are good He has had some NSVT though rare and short VS 100%, HR histograms look good OptiVol way below threshold    05/24/2003: TTE SUMMARY   -  Overall left ventricular systolic function was moderately to         markedly decreased. Left ventricular ejection fraction was         estimated , range being 25 % to 35 %. There was moderate         diffuse left ventricular hypokinesis.    Recent Labs: 08/04/2021: ALT 15; BUN 11; Creatinine, Ser 1.08; Hemoglobin 11.6; Platelets 202.0; Potassium 3.8; Sodium 143; TSH 3.31  08/04/2021: Cholesterol 99; HDL 40.00; LDL Cholesterol 45; Total CHOL/HDL Ratio 2; Triglycerides 69.0; VLDL 13.8   CrCl cannot be calculated (Patient's most recent lab result is older than the maximum 21 days allowed.).   Wt Readings from Last 3 Encounters:  11/20/21 190 lb 9.6 oz (86.5 kg)  11/06/21 199 lb (90.3 kg)  09/03/21 193 lb (87.5 kg)     Other studies reviewed: Additional studies/records reviewed today include: summarized above  ASSESSMENT AND PLAN:  1. ICD 2. H/o VT storm     On sotalol, stable QT     Rare NSVT only  3. NICM 4. Chronic CF (systolic)     No symptoms or exam findings of volume OL      OptiVol is way down, suspect dehydrated      On BB/ACE, dig  Labs today  5. HTN     + orthostatic BP changes, mild symptoms today  6. recurrent near syncope He describes orthostatic symptoms He does not drink water, tends to drink tea/soda I have urged that he increase water intake OptiVol is way down and he is likely dehydrated Stop amlodipine Monitor symptoms and as he has been doing sit until resolved, advised standing slowly, monitoring symptoms prior to walking away from his chair  He asks about a note for work, I hope that this will turn around for him soon with above Will  see him  back in a week or so, sooner if needed.         Disposition: as above   Current medicines are reviewed at length with the patient today.  The patient did not have any concerns regarding medicines.  Venetia Night, PA-C 12/01/2021 5:31 PM     [address] [place]  [phone] (office)  [phone] (fax)

## 2021-12-01 NOTE — Telephone Encounter (Signed)
Pt c/o Syncope: STAT if syncope occurred within 30 minutes and pt complains of lightheadedness High Priority if episode of passing out, completely, today or in last 24 hours   Did you pass out today? no  When is the last time you passed out? Patient has not passed out   Has this occurred multiple times? yes  Did you have any symptoms prior to passing out? Patient just feels hot, tired and has to sit down.  The patient said this happens at work and he has to go sit in the freezer to cool down. When he is at home he has to put ice on his neck to cool down. The symptoms seem to come on with any sort of exertion or walking very far. The wife said that this has been happening for a few months

## 2021-12-02 ENCOUNTER — Encounter: Payer: Self-pay | Admitting: Internal Medicine

## 2021-12-02 ENCOUNTER — Encounter: Payer: Self-pay | Admitting: Physician Assistant

## 2021-12-02 ENCOUNTER — Ambulatory Visit (INDEPENDENT_AMBULATORY_CARE_PROVIDER_SITE_OTHER): Payer: Medicare PPO | Admitting: Physician Assistant

## 2021-12-02 VITALS — BP 120/60 | HR 69 | Ht 71.0 in | Wt 190.8 lb

## 2021-12-02 DIAGNOSIS — I5022 Chronic systolic (congestive) heart failure: Secondary | ICD-10-CM | POA: Diagnosis not present

## 2021-12-02 DIAGNOSIS — R42 Dizziness and giddiness: Secondary | ICD-10-CM | POA: Diagnosis not present

## 2021-12-02 DIAGNOSIS — Z95810 Presence of automatic (implantable) cardiac defibrillator: Secondary | ICD-10-CM

## 2021-12-02 DIAGNOSIS — I472 Ventricular tachycardia, unspecified: Secondary | ICD-10-CM

## 2021-12-02 DIAGNOSIS — I10 Essential (primary) hypertension: Secondary | ICD-10-CM | POA: Diagnosis not present

## 2021-12-02 LAB — CUP PACEART INCLINIC DEVICE CHECK
Battery Remaining Longevity: 39 mo
Battery Voltage: 2.95 V
Brady Statistic RV Percent Paced: 0.02 %
Date Time Interrogation Session: 20230725175218
HighPow Impedance: 67 Ohm
Implantable Lead Implant Date: 20140530
Implantable Lead Location: 753860
Implantable Lead Model: 6935
Implantable Pulse Generator Implant Date: 20140530
Lead Channel Impedance Value: 399 Ohm
Lead Channel Impedance Value: 475 Ohm
Lead Channel Pacing Threshold Amplitude: 0.625 V
Lead Channel Pacing Threshold Pulse Width: 0.4 ms
Lead Channel Sensing Intrinsic Amplitude: 6.5 mV
Lead Channel Sensing Intrinsic Amplitude: 7.625 mV
Lead Channel Setting Pacing Amplitude: 2.5 V
Lead Channel Setting Pacing Pulse Width: 0.4 ms
Lead Channel Setting Sensing Sensitivity: 0.3 mV

## 2021-12-02 NOTE — Patient Instructions (Addendum)
Medication Instructions:  Your physician has recommended you make the following change in your medication:    STOP TAKING your NORVASC ( Amlodipine 2.5 mg ) NOW.    2.   INCREASE YOUR WATER INTAKE DAILY.     Labwork: You will have blood work drawn today:  CBC, and BMET  Testing/Procedures: None ordered.  Follow-Up: Your physician wants you to follow-up in: 1 WEEK with Legrand Como "Jonni Sanger" [place] if available, then PA-C or Tommye Standard, PA-C.    Remote monitoring is used to monitor your ICD from home. This monitoring reduces the number of office visits required to check your device to one time per year. It allows [place] to keep an eye on the functioning of your device to ensure it is working properly. You are scheduled for a device check from home on 12/18/21. You may send your transmission at any time that day. If you have a wireless device, the transmission will be sent automatically. After your physician reviews your transmission, you will receive a postcard with your next transmission date.  Any Other Special Instructions Will Be Listed Below (If Applicable).  If you need a refill on your cardiac medications before your next appointment, please call your pharmacy.   Important Information About Sugar

## 2021-12-03 ENCOUNTER — Other Ambulatory Visit: Payer: Self-pay | Admitting: Physician Assistant

## 2021-12-03 LAB — BASIC METABOLIC PANEL
BUN/Creatinine Ratio: 7 — ABNORMAL LOW (ref 10–24)
BUN: 8 mg/dL (ref 8–27)
CO2: 26 mmol/L (ref 20–29)
Calcium: 9.6 mg/dL (ref 8.6–10.2)
Chloride: 106 mmol/L (ref 96–106)
Creatinine, Ser: 1.07 mg/dL (ref 0.76–1.27)
Glucose: 93 mg/dL (ref 70–99)
Potassium: 4.3 mmol/L (ref 3.5–5.2)
Sodium: 144 mmol/L (ref 134–144)
eGFR: 75 mL/min/{1.73_m2} (ref >59)

## 2021-12-03 LAB — CBC WITH DIFFERENTIAL/PLATELET
Basophils Absolute: 0 10*3/uL (ref 0.0–0.2)
Basos: 0 %
EOS (ABSOLUTE): 0.1 10*3/uL (ref 0.0–0.4)
Eos: 2 %
Hematocrit: 37.1 % — ABNORMAL LOW (ref 37.5–51.0)
Hemoglobin: 12.2 g/dL — ABNORMAL LOW (ref 13.0–17.7)
Immature Grans (Abs): 0 10*3/uL (ref 0.0–0.1)
Immature Granulocytes: 0 %
Lymphocytes Absolute: 1.9 10*3/uL (ref 0.7–3.1)
Lymphs: 35 %
MCH: 23.5 pg — ABNORMAL LOW (ref 26.6–33.0)
MCHC: 32.9 g/dL (ref 31.5–35.7)
MCV: 71 fL — ABNORMAL LOW (ref 79–97)
Monocytes Absolute: 0.5 10*3/uL (ref 0.1–0.9)
Monocytes: 10 %
Neutrophils Absolute: 2.9 10*3/uL (ref 1.4–7.0)
Neutrophils: 53 %
Platelets: 247 10*3/uL (ref 150–450)
RBC: 5.2 x10E6/uL (ref 4.14–5.80)
RDW: 14.4 % (ref 11.6–15.4)
WBC: 5.4 10*3/uL (ref 3.4–10.8)

## 2021-12-10 NOTE — Progress Notes (Unsigned)
Cardiology Office Note Date:  12/11/2021  Patient ID:  [name] 1950/08/21, MRN [id number] PCP:  Binnie Rail, MD  Cardiologist:  Dr. Lovena Le   Chief Complaint: recurrent near syncope  History of Present Illness: [name] is a 71 y.o. male with history of anxiety, arthritis, GERD, HTN, HLD, NICM, chronic CHF (systolic), VT, ICD, OSA intolerant of CPAP  Pt came in 12/02/2021 to be seen for Dr. Lovena Le, last seen by him jan 2023, , back at work, had lost some weight, no sustained VT/therapies.  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 Elsworth Soho 11/20/21, working on getting a CPAP mask that won't leak/that he can tolerate  Called 12/01/21 with what sounds like perhaps a couple months of near syncope? That he will put cold compress/ice on the back of his neck or sit in the cooler at work for a bit that helps  12/02/2021 He is accompanied by his wife He reports a couple months of dizziness upon standing sometimes to the point of near syncope, this has been going on a few months, by his report, his wife recalls episodes started back in October. They both agree that the frequency and severity though are both increasing  in the last 2 weeks to every time he stands up he gets lightheaded, and that the recovery is taking longer He has not worked on a couple weeks, getting lightheaded so often.  He is very clear that the dizziness/lightheaded weak spells ONLY happen from sitting to standing, and as he gets up and starts to walk he is very weak, unsteady, feels hot, and has to sit down to feel better, sometimes will seek out cooler places like the cooler at work. Though does not have to be a hot environment, feels hot and looks to sit down and cool off.  He has not fainted or fallen, but has stumbled.  His wife points out that he also has a gait instability that pre-dates the orthostatic symptoms and they report  that they have discussed this with neurology who felt perhaps a back problem?  He is seeing orthopedics with arthritic in back and pending further eval.  NO CP, NO SOB, NO palpitations with or without the symptoms of dizziness/weakness  TODAY Pt reports no major change in symptoms since dropping amlodipine.   + Orthostatic today.  BP sitting 112/70 BP standing at 0 minutes 80/ palp.  Symptoms primarily from sitting to standing. At times, he they may recur after 15-20 seconds of walking. Denies falls or syncope. Has not followed up with neurology, not scheduled to see until January.  Hydrating "much better". Lowered soda intake.   Device information MDT single chamber ICD, current system implanted 10/07/2012 Initial device implant was ?2005, 6949 lead was capped/abandoned AAD 2012, VT storm, numerous shocks, started on sotalol  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   Not compatible with MRI    Barrett's esophagus 09/05/2004   2 cm changes seen at index EGD   Cataract    Chronic back pain 19/50/9326   Chronic systolic heart failure    Colon polyps    tubular adenoma   Dysrhythmia    Essential hypertension 08/26/2009   Fatigue 12/02/2020   Generalized anxiety disorder    since defibrillator placement   GERD (gastroesophageal reflux disease)  Hip pain 12/14/2016   History of COVID-19    History of myocardial infarction    Hyperlipidemia    Left knee pain 12/14/2016   Malignant neoplasm of prostate 09/27/2014   s/p prostatectomy   Mild anemia 01/20/2011   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 b. s/p Medtronic ICD.   Obstructive sleep apnea 11/10/2012   no CPAP use   Right foot pain 12/15/2017   RLS (restless legs syndrome) 11/29/2012   S/P  radiation therapy    10/10/2014 through 11/26/2014; Prostate bed 6600 cGy in 33 sessions   Shingles 07/28/2016   Type II diabetes mellitus 08/26/2009   Ventricular tachycardia ([place])    a. s/p ICD (generator change 09/2012). b. 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 and placed on sotalol   Vitamin B 12 deficiency 03/06/2011   Monthly B12 shots initiated  2011. Nasally  inhaled B12 as of 07/2012    Past Surgical History:  Procedure Laterality Date   CARDIAC DEFIBRILLATOR PLACEMENT  03/11/2004   Medtronic Maximo single lead   COLONOSCOPY W/ BIOPSIES AND POLYPECTOMY  08/2004, 02/24/11   71m adenoma in 2006,  5 mm polyp (not recovered) 2012   [place] 10/[place]/2015   Procedure: CYSTOSCOPY WITH DIRECT VISION INTERNAL URETHROTOMY;  Surgeon: BNickie Retort MD;  Location: [facility];  Service: Urology;  Laterality: N/A;   IMPLANTABLE CARDIOVERTER DEFIBRILLATOR GENERATOR CHANGE N/A 10/07/2012   Procedure: IMPLANTABLE CARDIOVERTER DEFIBRILLATOR GENERATOR CHANGE;  Surgeon: GEvans Lance MD;  [facility];  Service: Cardiovascular;  Laterality: N/A;   PROSTATECTOMY  06/2003   Dr NJanice Norrie  RECTAL SURGERY     UPPER GASTROINTESTINAL ENDOSCOPY  08/2004, 02/24/11   Barrett's esophagus    Current Outpatient Medications  Medication Sig Dispense Refill   ACCU-CHEK GUIDE test strip USE TO CHECK SUGAR 4 TIMES A DAY 100 strip 4   aspirin (ASPIRIN LOW DOSE) 81 MG EC tablet Take 1 tablet (81 mg total) by mouth daily. Swallow whole. 90 tablet 3   atorvastatin (LIPITOR) 20 MG tablet TAKE 1 TABLET BY MOUTH DAILY EXCEPT 1/2 TABLET ON TUESDAY, THURSDAY AND SATURDAY. 24 tablet 8   carvedilol (COREG) 25 MG tablet TAKE 1 TABLET TWICE DAILY WITH MEALS 180 tablet 1   digoxin (LANOXIN) 0.125 MG tablet TAKE 1 TABLET EVERY DAY 30 tablet 2   docusate sodium (COLACE) 100 MG capsule Take 1 capsule (100 mg total) by mouth 2 (two) times daily as needed for mild constipation. 60 capsule 2   donepezil  (ARICEPT) 10 MG tablet TAKE HALF TABLET (5 MG) DAILY FOR 2 WEEKS, THEN INCREASE TO THE FULL TABLET AT 10 MG DAILY 90 tablet 1   famotidine (PEPCID) 20 MG tablet Take 1 tablet (20 mg total) by mouth daily. 90 tablet 3   gabapentin (NEURONTIN) 100 MG capsule Take 1 capsule (100 mg total) by mouth 2 (two) times daily. 60 capsule 2   Lancets (ONETOUCH ULTRASOFT) lancets Use as instructed to test sugar daily and prn. Dx E11.9 100 each 12   omeprazole (PRILOSEC) 20 MG capsule TAKE 1 CAPSULE DAILY 30 MINUTES BEFORE BREAKFAST 90 capsule 1   ramipril (ALTACE) 10 MG capsule TAKE 1 CAPSULE TWICE DAILY 60 capsule 5   sitaGLIPtin-metformin (JANUMET) 50-1000 MG tablet Take 1 tablet by mouth daily. Take with a meal 30 tablet 8   sotalol (BETAPACE) 120 MG tablet TAKE  1 TABLET TWICE DAILY 120 tablet 2   traMADol (ULTRAM) 50 MG tablet Take 1 tablet (50 mg total) by mouth every 6 (six) hours as needed. 30 tablet 0   vitamin B-12 (CYANOCOBALAMIN) 1000 MCG tablet TAKE 1 TABLET EVERY DAY 90 tablet 1   No current facility-administered medications for this visit.    Allergies:   Zantac [ranitidine hcl]   Social History:  The patient  reports that he has never smoked. He has never used smokeless tobacco. He reports that he does not drink alcohol and does not use drugs.   Family History:  The patient's family history includes Coronary artery disease in his father; Dementia in his brother, maternal aunt, mother, and sister; Diabetes in his father and paternal grandmother; Hypertension in his mother; Prostate cancer in his father.  Review of systems complete and found to be negative unless listed in HPI.    PHYSICAL EXAM:  VS:  BP (!) 142/78   Pulse 68   Ht '5\' 11"'$  (1.803 m)   Wt 191 lb (86.6 kg)   SpO2 97%   BMI 26.64 kg/m  BMI: Body mass index is 26.64 kg/m.  Wt Readings from Last 3 Encounters:  12/11/21 191 lb (86.6 kg)  12/02/21 190 lb 12.8 oz (86.5 kg)  11/20/21 190 lb 9.6 oz (86.5 kg)    General:  Pleasant, NAD. No resp difficulty Psych: Normal affect. HEENT:  Normal, without mass or lesion.         Neck: Supple, no bruits or JVD. Carotids 2+. No lymphadenopathy/thyromegaly appreciated. Heart: PMI nondisplaced. RRR no s3, s4, or murmurs. Lungs:  Resp regular and unlabored, CTA. Abdomen: Soft, non-tender, non-distended, No HSM, BS + x 4.   Extremities: No clubbing, cyanosis or edema. DP/PT/Radials 2+ and equal bilaterally. Neuro: Alert and oriented X 3. Moves all extremities spontaneously.   ICD site is stable, no tethering or discomfort   EKG:  Reviewed from 12/02/2021;   SR 69 bpm, QTc 437, T changes inf/lat, lateral changes have been seen in older EKGs  ICD interrogation Not checked today.    05/24/2003: TTE SUMMARY   -  Overall left ventricular systolic function was moderately to         markedly decreased. Left ventricular ejection fraction was         estimated , range being 25 % to 35 %. There was moderate         diffuse left ventricular hypokinesis.    Recent Labs: 08/04/2021: ALT 15; TSH 3.31 12/02/2021: BUN 8; Creatinine, Ser 1.07; Hemoglobin 12.2; Platelets 247; Potassium 4.3; Sodium 144  08/04/2021: Cholesterol 99; HDL 40.00; LDL Cholesterol 45; Total CHOL/HDL Ratio 2; Triglycerides 69.0; VLDL 13.8   Estimated Creatinine Clearance: 68.4 mL/min (by C-G formula based on SCr of 1.07 mg/dL).   Wt Readings from Last 3 Encounters:  12/11/21 191 lb (86.6 kg)  12/02/21 190 lb 12.8 oz (86.5 kg)  11/20/21 190 lb 9.6 oz (86.5 kg)     Other studies reviewed: Additional studies/records reviewed today include: summarized above  ASSESSMENT AND PLAN:  1. ICD 2. H/o VT storm   Stable EKG last visit on sotalol with only rare NSVT only  3. NICM 4. Chronic CF (systolic)     No symptoms or exam findings of volume OL      OptiVol is way down, suspect dehydrated      On BB/ACE, dig  Labs today  5. HTN 6. Orthostasis 7. Lightheadedness. Significantly orthostatic  today.  Will stop ramipril and change to losartan 50 mg daily for ONCE daily dosing, not to start until tomorrow.  Recommended continued hydration and slight liberalization of sodium. Would start gently with gatorade.  Measured thighs and provided link to thigh compression sleeves.  Again, worry that vascular neurological diagnosis could be contributing to this and have recommended close neuro follow up.   He asks about a note for work, I hope that this will turn around for him soon with above Will see him back in a week or so, sooner if needed.  Disposition: RTC 3-4 weeks. Recommended close neurology follow up with reports of "vascular alzheimers"   Signed, Lollie Marrow, PA-C  12/11/2021 10:40 AM   [facility] [place] [address] [phone] (office)  [phone] (fax)

## 2021-12-11 ENCOUNTER — Encounter: Payer: Self-pay | Admitting: Student

## 2021-12-11 ENCOUNTER — Ambulatory Visit (INDEPENDENT_AMBULATORY_CARE_PROVIDER_SITE_OTHER): Payer: Medicare PPO | Admitting: Student

## 2021-12-11 VITALS — BP 142/78 | HR 68 | Ht 71.0 in | Wt 191.0 lb

## 2021-12-11 DIAGNOSIS — I472 Ventricular tachycardia, unspecified: Secondary | ICD-10-CM | POA: Diagnosis not present

## 2021-12-11 DIAGNOSIS — Z95810 Presence of automatic (implantable) cardiac defibrillator: Secondary | ICD-10-CM | POA: Diagnosis not present

## 2021-12-11 DIAGNOSIS — R42 Dizziness and giddiness: Secondary | ICD-10-CM | POA: Diagnosis not present

## 2021-12-11 DIAGNOSIS — I5022 Chronic systolic (congestive) heart failure: Secondary | ICD-10-CM

## 2021-12-11 MED ORDER — LOSARTAN POTASSIUM 50 MG PO TABS: 50.0000 mg | ORAL_TABLET | Freq: Every day | ORAL | 3 refills | Status: DC

## 2021-12-11 NOTE — Patient Instructions (Signed)
Medication Instructions:  Your physician has recommended you make the following change in your medication:   DISCONTINUE: Ramipril START: Losartan '50mg'$  daily at bedtime (start tomorrow 8/4)  *If you need a refill on your cardiac medications before your next appointment, please call your pharmacy*   Lab Work: TODAY: Digoxin level  If you have labs (blood work) drawn today and your tests are completely normal, you will receive your results only by: [place] (if you have MyChart) OR A paper copy in the mail If you have any lab test that is abnormal or we need to change your treatment, we will call you to review the results.   Follow-Up: At [facility], you and your health needs are our priority.  As part of our continuing mission to provide you with exceptional heart care, we have created designated Provider Care Teams.  These Care Teams include your primary Cardiologist (physician) and Advanced Practice Providers (APPs -  Physician Assistants and Nurse Practitioners) who all work together to provide you with the care you need, when you need it.   Your next appointment:   12/31/2021

## 2021-12-12 ENCOUNTER — Other Ambulatory Visit: Payer: Self-pay

## 2021-12-12 LAB — DIGOXIN LEVEL: Digoxin, Serum: 0.9 ng/mL (ref 0.5–0.9)

## 2021-12-12 MED ORDER — DIGOXIN 125 MCG PO TABS: 0.0625 mg | ORAL_TABLET | Freq: Every day | ORAL | 3 refills | Status: DC

## 2021-12-16 ENCOUNTER — Encounter: Payer: Self-pay | Admitting: Physician Assistant

## 2021-12-16 ENCOUNTER — Ambulatory Visit: Payer: Medicare PPO | Admitting: Physician Assistant

## 2021-12-16 VITALS — BP 143/82 | HR 73 | Resp 20 | Ht 68.0 in | Wt 189.0 lb

## 2021-12-16 DIAGNOSIS — M5430 Sciatica, unspecified side: Secondary | ICD-10-CM

## 2021-12-16 DIAGNOSIS — G8929 Other chronic pain: Secondary | ICD-10-CM

## 2021-12-16 DIAGNOSIS — F0670 Mild neurocognitive disorder due to known physiological condition without behavioral disturbance: Secondary | ICD-10-CM | POA: Diagnosis not present

## 2021-12-16 DIAGNOSIS — G3184 Mild cognitive impairment, so stated: Secondary | ICD-10-CM

## 2021-12-16 DIAGNOSIS — M549 Dorsalgia, unspecified: Secondary | ICD-10-CM | POA: Diagnosis not present

## 2021-12-16 DIAGNOSIS — I999 Unspecified disorder of circulatory system: Secondary | ICD-10-CM

## 2021-12-16 NOTE — Progress Notes (Unsigned)
Assessment/Plan:   Mild Cognitive Impairment   [name] is a very pleasant 71 y.o. RH male presenting today as scheduled by his wife who says that the patient's memory is worse.  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  CT of the head (PMP) on 02/17/2021 was unremarkable.  His last MMSE on 11/06/2021 was 30/30.  Despite subjective complaints by his wife that his memory is not good, he is able to perform well during this visit.  He has scheduled repeat Neuropsych evaluation on December 2023 for clarity of the diagnosis and disease trajectory.    Recommendations:   Keep appointment with neuro psychological evaluation for clarity of the diagnosis and disease trajectory.  Will try to see if he can be placed on a cancellation basis although unlikely Continue donepezil 10 mg daily Follow-up in 6 months Continue CPAP use Follow-up with cardiology on his orthostatic hypotension.  No neurological explanation for his orthostatic hypotension.  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  No signs of Parkinson's and he has normal sensation and reflexes.  Pain is very controlled. Agree with PT and pain control regimen as per specialist    Case discussed with Dr. Delice Lesch who agrees with the plan     Subjective:   This patient is accompanied in the office by male who supplements the history.  Previous records as well as any outside records available were reviewed prior to todays visit.  Patient is currently on donepezil 10 mg daily.  He was last seen at the office in November 06, 2021.  He was supposed to be seen in 6 months, but his wife complains about his memory.   Any changes in memory since last visit?  Patient states  that is about the same, with issues with short-term memory greater than long-term memory.  His wife disagrees, she says that his memory is worse, because he cannot find the [employer] fundraiser, he forgot that he brought it, to which he quickly denies. Patient lives with:  repeats oneself?  Endorsed Disoriented when walking into a room?  Patient denies   Leaving objects in unusual places?  Patient denies   Ambulates  with difficulty?   Patient denies   Recent falls?  Patient denies   Any head injuries?  Patient denies   History of seizures?   Patient denies   Wandering behavior?  Patient denies   Patient drives?  Only family around Any mood changes since last visit?  Patient denies   Any worsening depression?:  Patient denies   Hallucinations?  Sometimes he sees spiders out of the wall. Paranoia?  Patient denies   Patient reports that he sleeps well without vivid dreams, REM behavior or sleepwalking   History of sleep apnea?  Patient denies   Any hygiene concerns?  Patient denies   Independent of bathing and dressing?  Endorsed  Does the patient needs help with medications?  Wife is not who is in charge of the finances?  Wife is in charge    Any changes in appetite?  Patient denies   Patient have trouble swallowing? Patient denies   Does the patient cook?  Patient denies   Any kitchen accidents such as leaving the stove on? Patient denies   Any headaches?  Patient denies   Double vision? Patient denies   Any focal numbness or tingling?  Patient denies   Chronic back pain Patient denies   Unilateral weakness?  Patient denies   Any tremors?  Patient denies   Any history of anosmia?  Patient denies   Any incontinence of urine?  Patient denies   Any bowel dysfunction?   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   Not compatible with MRI     Barrett's esophagus 09/05/2004   2 cm changes seen at index EGD   Cataract    Chronic back pain 92/33/0076   Chronic systolic heart failure    Colon polyps    tubular adenoma   Dysrhythmia    Essential hypertension 08/26/2009   Fatigue 12/02/2020   Generalized anxiety disorder    since defibrillator placement   GERD (gastroesophageal reflux disease)    Hip pain 12/14/2016   History of COVID-19    History of myocardial infarction    Hyperlipidemia    Left knee pain 12/14/2016   Malignant neoplasm of prostate 09/27/2014   s/p prostatectomy   Mild anemia 01/20/2011   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 b. s/p Medtronic ICD.   Obstructive sleep apnea 11/10/2012   no CPAP use   Right foot pain 12/15/2017   RLS (restless legs syndrome) 11/29/2012   S/P radiation therapy    10/10/2014 through 11/26/2014; Prostate bed 6600 cGy in 33 sessions   Shingles 07/28/2016   Type II diabetes mellitus 08/26/2009   Ventricular tachycardia ([place])    a. s/p ICD (generator change 09/2012). b. 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 and placed on sotalol   Vitamin B 12 deficiency 03/06/2011   Monthly B12 shots initiated  2011. Nasally  inhaled B12 as of 07/2012     Past Surgical History:  Procedure Laterality Date   CARDIAC DEFIBRILLATOR PLACEMENT  03/11/2004   Medtronic Maximo single lead   COLONOSCOPY W/ BIOPSIES AND POLYPECTOMY  08/2004, 02/24/11   60m adenoma in 2006,  5 mm polyp (not recovered) 2012   CTroyINTERNAL URETHROTOMY N/A 10/08/2015   Procedure: CYSTOSCOPY WITH DIRECT VISION INTERNAL URETHROTOMY;  Surgeon: BNickie Retort MD;  Location: [facility];  Service: Urology;  Laterality: N/A;   IMPLANTABLE CARDIOVERTER DEFIBRILLATOR GENERATOR CHANGE N/A 10/07/2012   Procedure: IMPLANTABLE CARDIOVERTER DEFIBRILLATOR GENERATOR CHANGE;  Surgeon: GEvans Lance MD;  [facility];   Service: Cardiovascular;  Laterality: N/A;   PROSTATECTOMY  06/2003   Dr NJanice Norrie  RECTAL SURGERY     UPPER GASTROINTESTINAL ENDOSCOPY  08/2004, 02/24/11   Barrett's esophagus     PREVIOUS MEDICATIONS:   CURRENT MEDICATIONS:  Outpatient Encounter Medications as of 12/16/2021  Medication Sig   ACCU-CHEK GUIDE test strip USE TO CHECK SUGAR 4 TIMES A DAY   aspirin (ASPIRIN LOW DOSE) 81 MG EC tablet Take 1 tablet (81 mg total) by mouth daily. Swallow whole.   atorvastatin (LIPITOR) 20 MG tablet TAKE 1 TABLET BY MOUTH DAILY EXCEPT 1/2 TABLET ON TUESDAY, THURSDAY AND SATURDAY.   carvedilol (COREG) 25 MG tablet TAKE 1 TABLET TWICE DAILY WITH MEALS   digoxin (LANOXIN) 0.125 MG tablet Take 0.5 tablets (0.0625 mg total) by mouth daily.  docusate sodium (COLACE) 100 MG capsule Take 1 capsule (100 mg total) by mouth 2 (two) times daily as needed for mild constipation.   donepezil (ARICEPT) 10 MG tablet TAKE HALF TABLET (5 MG) DAILY FOR 2 WEEKS, THEN INCREASE TO THE FULL TABLET AT 10 MG DAILY   famotidine (PEPCID) 20 MG tablet Take 1 tablet (20 mg total) by mouth daily.   gabapentin (NEURONTIN) 100 MG capsule Take 1 capsule (100 mg total) by mouth 2 (two) times daily.   Lancets (ONETOUCH ULTRASOFT) lancets Use as instructed to test sugar daily and prn. Dx E11.9   losartan (COZAAR) 50 MG tablet Take 1 tablet (50 mg total) by mouth at bedtime.   omeprazole (PRILOSEC) 20 MG capsule TAKE 1 CAPSULE DAILY 30 MINUTES BEFORE BREAKFAST   sitaGLIPtin-metformin (JANUMET) 50-1000 MG tablet Take 1 tablet by mouth daily. Take with a meal   sotalol (BETAPACE) 120 MG tablet TAKE 1 TABLET TWICE DAILY   traMADol (ULTRAM) 50 MG tablet Take 1 tablet (50 mg total) by mouth every 6 (six) hours as needed.   vitamin B-12 (CYANOCOBALAMIN) 1000 MCG tablet TAKE 1 TABLET EVERY DAY   No facility-administered encounter medications on file as of 12/16/2021.     Objective:     PHYSICAL EXAMINATION:    VITALS:   Vitals:    12/16/21 0914  BP: (!) 143/82  Pulse: 73  Resp: 20  SpO2: 97%  Weight: 189 lb (85.7 kg)  Height: '5\' 8"'$  (1.727 m)    GEN:  The patient appears stated age and is in NAD. HEENT:  Normocephalic, atraumatic.   Neurological examination:  General: NAD, well-groomed, appears stated age. Orientation: The patient is alert. Oriented to person, place and date Cranial nerves: There is good facial symmetry.The speech is fluent and clear. No aphasia or dysarthria. Fund of knowledge is appropriate. Recent memory impaired and remote memory is normal.  Attention and concentration are normal.  Able to name objects and repeat phrases.  Hearing is intact to conversational tone.    Sensation: Sensation is intact to light touch throughout Motor: Strength is at least antigravity x4. Tremors: none  DTR's 2/4 in UE/LE      12/05/2020   10:00 AM  Montreal Cognitive Assessment   Visuospatial/ Executive (0/5) 3  Naming (0/3) 2  Attention: Read list of digits (0/2) 2  Attention: Read list of letters (0/1) 1  Attention: Serial 7 subtraction starting at 100 (0/3) 1  Language: Repeat phrase (0/2) 0  Language : Fluency (0/1) 0  Abstraction (0/2) 0  Delayed Recall (0/5) 1  Orientation (0/6) 6  Total 16       11/10/2021    6:00 PM  MMSE - Mini Mental State Exam  Orientation to time 5  Orientation to Place 5  Registration 3  Attention/ Calculation 5  Recall 3  Language- name 2 objects 2  Language- repeat 1  Language- follow 3 step command 3  Language- read & follow direction 1  Write a sentence 1  Copy design 1  Total score 30       Movement examination: Tone: There is normal tone in the UE/LE Abnormal movements:  no tremor.  No myoclonus.  No asterixis.   Coordination:  There is no decremation with RAM's. Normal finger to nose  Gait and Station: The patient has some difficulty arising out of a deep-seated chair without the use of the hands, needs a few more seconds to stand up slowly to prevent  dizziness due  to orthostatic hypotension. The patient's stride length is good.  Gait is cautious and narrow.   Thank you for allowing [place] the opportunity to participate in the care of this nice patient. Please do not hesitate to contact us for any questions or concerns.   Total time spent on today's visit was 25 minutes dedicated to this patient today, preparing to see patient, examining the patient, ordering tests and/or medications and counseling the patient, documenting clinical information in the EHR or other health record, independently interpreting results and communicating results to the patient/family, discussing treatment and goals, answering patient's questions and coordinating care.  Cc:  Binnie Rail, MD  Sharene Butters 12/17/2021 6:51 AM

## 2021-12-17 ENCOUNTER — Encounter: Payer: Self-pay | Admitting: Physician Assistant

## 2021-12-17 ENCOUNTER — Encounter: Payer: Self-pay | Admitting: Internal Medicine

## 2021-12-17 NOTE — Telephone Encounter (Signed)
Will recommend to place patient in a cancellation list for neurocognitive testing. If the testing says he is worse, will do accordingly. Needs that testing prior to making any changes. No FDG PET, that is for frontotemporal dementia. Other types  PET body scan are for cancer and we don't do them. If Dr. Melvyn Novas recommends a PET scan for the brain will proceed but not till then. Thanks

## 2021-12-17 NOTE — Telephone Encounter (Signed)
Noted.. See previous msg../lmb

## 2021-12-18 ENCOUNTER — Encounter: Payer: Self-pay | Admitting: Physical Medicine and Rehabilitation

## 2021-12-18 ENCOUNTER — Ambulatory Visit: Payer: Medicare PPO | Admitting: Physical Medicine and Rehabilitation

## 2021-12-18 ENCOUNTER — Ambulatory Visit (INDEPENDENT_AMBULATORY_CARE_PROVIDER_SITE_OTHER): Payer: Medicare PPO

## 2021-12-18 DIAGNOSIS — R202 Paresthesia of skin: Secondary | ICD-10-CM

## 2021-12-18 DIAGNOSIS — I428 Other cardiomyopathies: Secondary | ICD-10-CM

## 2021-12-18 DIAGNOSIS — R531 Weakness: Secondary | ICD-10-CM

## 2021-12-18 DIAGNOSIS — R269 Unspecified abnormalities of gait and mobility: Secondary | ICD-10-CM

## 2021-12-18 NOTE — Progress Notes (Signed)
Pt state pain in his lower back and legs. Pt state it hard for him to walk.

## 2021-12-18 NOTE — Telephone Encounter (Signed)
That is the only thing that is recommended at this time. No other testing is indicated until evaluation by him. Thanks

## 2021-12-19 ENCOUNTER — Ambulatory Visit: Payer: Medicare PPO | Admitting: Family Medicine

## 2021-12-19 ENCOUNTER — Encounter: Payer: Self-pay | Admitting: Family Medicine

## 2021-12-19 VITALS — BP 150/84 | HR 60 | Temp 97.6°F | Ht 68.0 in | Wt 189.0 lb

## 2021-12-19 DIAGNOSIS — K6289 Other specified diseases of anus and rectum: Secondary | ICD-10-CM | POA: Diagnosis not present

## 2021-12-19 DIAGNOSIS — Z8719 Personal history of other diseases of the digestive system: Secondary | ICD-10-CM

## 2021-12-19 DIAGNOSIS — Z9079 Acquired absence of other genital organ(s): Secondary | ICD-10-CM | POA: Diagnosis not present

## 2021-12-19 LAB — CUP PACEART REMOTE DEVICE CHECK
Battery Remaining Longevity: 37 mo
Battery Voltage: 2.95 V
Brady Statistic RV Percent Paced: 0.04 %
Date Time Interrogation Session: 20230810044224
HighPow Impedance: 59 Ohm
Implantable Lead Implant Date: 20140530
Implantable Lead Location: 753860
Implantable Lead Model: 6935
Implantable Pulse Generator Implant Date: 20140530
Lead Channel Impedance Value: 361 Ohm
Lead Channel Impedance Value: 418 Ohm
Lead Channel Pacing Threshold Amplitude: 0.5 V
Lead Channel Pacing Threshold Pulse Width: 0.4 ms
Lead Channel Sensing Intrinsic Amplitude: 6 mV
Lead Channel Sensing Intrinsic Amplitude: 6 mV
Lead Channel Setting Pacing Amplitude: 2.5 V
Lead Channel Setting Pacing Pulse Width: 0.4 ms
Lead Channel Setting Sensing Sensitivity: 0.3 mV

## 2021-12-19 LAB — HEMOCCULT GUIAC POC 1CARD (OFFICE): Fecal Occult Blood, POC: NEGATIVE

## 2021-12-19 LAB — CBC WITH DIFFERENTIAL/PLATELET
Basophils Absolute: 0 10*3/uL (ref 0.0–0.1)
Basophils Relative: 0.3 % (ref 0.0–3.0)
Eosinophils Absolute: 0.2 10*3/uL (ref 0.0–0.7)
Eosinophils Relative: 2.5 % (ref 0.0–5.0)
HCT: 37.4 % — ABNORMAL LOW (ref 39.0–52.0)
Hemoglobin: 11.9 g/dL — ABNORMAL LOW (ref 13.0–17.0)
Lymphocytes Relative: 34.8 % (ref 12.0–46.0)
Lymphs Abs: 2.2 10*3/uL (ref 0.7–4.0)
MCHC: 31.7 g/dL (ref 30.0–36.0)
MCV: 72.4 fl — ABNORMAL LOW (ref 78.0–100.0)
Monocytes Absolute: 0.5 10*3/uL (ref 0.1–1.0)
Monocytes Relative: 8.1 % (ref 3.0–12.0)
Neutro Abs: 3.4 10*3/uL (ref 1.4–7.7)
Neutrophils Relative %: 54.3 % (ref 43.0–77.0)
Platelets: 215 10*3/uL (ref 150.0–400.0)
RBC: 5.17 Mil/uL (ref 4.22–5.81)
RDW: 15.4 % (ref 11.5–15.5)
WBC: 6.3 10*3/uL (ref 4.0–10.5)

## 2021-12-19 NOTE — Patient Instructions (Signed)
Keep your stool soft. Follow up with Dr. Carlean Purl or one of his partners for the pain you are having.     Proctalgia Fugax  Proctalgia fugax is a condition that involves short episodes of intense pain in the rectum. The rectum is the last part of the large intestine. The pain can last from seconds to minutes. Episodes often occur during the night and wake the person from sleep. This condition is not a sign of cancer, but your health care provider may want to rule out other conditions. What are the causes? The exact cause of this condition is not known. One possible cause may be spasm of the pelvic muscles or spasms of the lowest part of the large intestine. What are the signs or symptoms? The only symptom of this condition is rectal pain. The pain may: Be intense or severe. Last for only a few seconds, or it may last up to 30 minutes. Occur at night and wake you up from sleep. How is this diagnosed? This condition may be diagnosed based on your medical history, a physical exam, and by ruling out other problems that could cause the pain. You may have various tests, such as: Anoscopy. In this test, a lighted scope is put into the rectum to look for abnormalities. Barium enema. In this test, X-rays are taken after a white, chalky substance called barium is put into the colon. The barium shows up well on the X-rays and makes it easier to see any problems. Blood tests to rule out infections or other problems. How is this treated? There is no specific treatment for this condition. This condition may be managed with: Medicines. Warm baths. Relaxation techniques, such as deep breathing exercises or gentle yoga. Gentle massage of the painful area. Biofeedback. Follow these instructions at home:  Take over-the-counter and prescription medicines only as told by your health care provider. Follow instructions from your health care provider about eating or drinking restrictions. Ask your health care  provider what activities are safe for you. Try warm baths, massaging the area, or progressive relaxation techniques as told by your health care provider. Keep all follow-up visits as told by your health care provider. This is important. Contact a health care provider if: Your pain gets worse. You develop new symptoms. You have anal pain along with a fever. Get help right away if you: Have blood in your stool or blood coming from the rectal area. Summary Proctalgia fugax is a condition that involves short episodes of intense pain in the rectum. Episodes often occur during the night and wake the person from sleep. The cause of this condition is not known. Medicines, warm baths, massage, relaxation techniques, and biofeedback may help to manage the pain. Get help right away if you have blood in your stool or blood coming from the rectal area. This information is not intended to replace advice given to you by your health care provider. Make sure you discuss any questions you have with your health care provider. Document Revised: 09/10/2021 Document Reviewed: 11/25/2020 Elsevier Patient Education  [place].

## 2021-12-19 NOTE — Progress Notes (Unsigned)
Subjective:     Patient ID: [name], male    DOB: 14-Jan-1951, 71 y.o.   MRN: [id number]  Chief Complaint  Patient presents with   rectum pain    Rectum pain when he was sitting for the last couple weeks, states he had a bowel movement and it helped relieve a lot of the pain but states it does hurt slightly still and if he sits for long periods of time.     HPI Patient is in today for rectal pain with sitting. NKI.  Hx of low back pain. L5-A1 minimal disc bulge. Moderate facet arthropathy.   Hx of rectal abscess, many years ago.   Hx of prostatectomy. No hemorrhoids.   Normal bowel movements. No blood.   Denies fever, chills, dizziness, chest pain, palpitations, shortness of breath, abdominal pain, N/V/D, urinary symptoms, LE edema.    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   Not compatible with MRI    Barrett's esophagus 09/05/2004   2 cm changes seen at index EGD   Cataract    Chronic back pain 57/84/6962   Chronic systolic heart failure    Colon polyps    tubular adenoma   Dysrhythmia    Essential hypertension 08/26/2009   Fatigue 12/02/2020   Generalized anxiety disorder    since defibrillator placement   GERD (gastroesophageal reflux disease)    Hip pain 12/14/2016   History of COVID-19    History of myocardial infarction    Hyperlipidemia    Left knee pain 12/14/2016   Malignant neoplasm of prostate 09/27/2014   s/p prostatectomy   Mild anemia 01/20/2011   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 b. s/p Medtronic ICD.   Obstructive  sleep apnea 11/10/2012   no CPAP use   Right foot pain 12/15/2017   RLS (restless legs syndrome) 11/29/2012   S/P radiation therapy    10/10/2014 through 11/26/2014; Prostate bed 6600 cGy in 33 sessions   Shingles 07/28/2016   Type II diabetes mellitus 08/26/2009   Ventricular tachycardia ([place])    a. s/p ICD (generator change 09/2012). b. 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 and placed on sotalol   Vitamin B 12 deficiency 03/06/2011   Monthly B12 shots initiated  2011. Nasally  inhaled B12 as of 07/2012    Past Surgical History:  Procedure Laterality Date   CARDIAC DEFIBRILLATOR PLACEMENT  03/11/2004   Medtronic Maximo single lead   COLONOSCOPY W/ BIOPSIES AND POLYPECTOMY  08/2004, 02/24/11   45m adenoma in 2006,  5 mm polyp (not recovered) 2012   CL[place] PanoramaINTERNAL URETHROTOMY N/A 10/08/2015   Procedure: CYSTOSCOPY WITH DIRECT VISION INTERNAL URETHROTOMY;  Surgeon: BNickie Retort MD;  Location: [facility];  Service: Urology;  Laterality: N/A;   IMPLANTABLE CARDIOVERTER DEFIBRILLATOR GENERATOR CHANGE N/A 10/07/2012   Procedure: IMPLANTABLE CARDIOVERTER DEFIBRILLATOR GENERATOR CHANGE;  Surgeon: GEvans Lance MD;  [facility];  Service: Cardiovascular;  Laterality: N/A;   PROSTATECTOMY  06/2003   Dr NJanice Norrie  RECTAL SURGERY     UPPER GASTROINTESTINAL  ENDOSCOPY  08/2004, 02/24/11   Barrett's esophagus    Family History  Problem Relation Age of Onset   Dementia Mother    Hypertension Mother    Coronary artery disease Father    Prostate cancer Father    Diabetes Father    Dementia Sister    Dementia Brother    Diabetes Paternal Grandmother    Dementia Maternal Aunt    Stroke Neg Hx    Colon cancer Neg Hx    Colon polyps Neg Hx    Esophageal cancer Neg Hx    Rectal cancer Neg Hx    Stomach cancer Neg Hx     Social History   Socioeconomic History   Marital status: Married    Spouse name: Not on file   Number of children: 2   Years of education: 16   Highest  education level: Bachelor's degree (e.g., BA, AB, BS)  Occupational History   Occupation: Therapist, sports: [employer]  Tobacco Use   Smoking status: Never   Smokeless tobacco: Never  Vaping Use   Vaping Use: Never used  Substance and Sexual Activity   Alcohol use: No   Drug use: No   Sexual activity: Not on file  Other Topics Concern   Not on file  Social History Narrative   Right handed   No caffeine   Two story home   Social Determinants of Health   Financial Resource Strain: Low Risk  (11/21/2021)   Overall Financial Resource Strain (CARDIA)    Difficulty of Paying Living Expenses: Not hard at all  Food Insecurity: No Food Insecurity (11/21/2021)   Hunger Vital Sign    Worried About Running Out of Food in the Last Year: Never true    Ran Out of Food in the Last Year: Never true  Transportation Needs: No Transportation Needs (11/21/2021)   PRAPARE - Hydrologist (Medical): No    Lack of Transportation (Non-Medical): No  Physical Activity: Sufficiently Active (11/21/2021)   Exercise Vital Sign    Days of Exercise per Week: 5 days    Minutes of Exercise per Session: 60 min  Stress: No Stress Concern Present (11/21/2021)   [place]    Feeling of Stress : Not at all  Social Connections: [place] (11/21/2021)   Social Connection and Isolation Panel [NHANES]    Frequency of Communication with Friends and Family: More than three times a week    Frequency of Social Gatherings with Friends and Family: More than three times a week    Attends Religious Services: More than 4 times per year    Active Member of [employer] or Organizations: Yes    Attends Archivist Meetings: More than 4 times per year    Marital Status: Married  Human resources officer Violence: Not At Risk (11/21/2021)   Humiliation, Afraid, Rape, and Kick questionnaire    Fear of Current or Ex-Partner:  No    Emotionally Abused: No    Physically Abused: No    Sexually Abused: No    Outpatient Medications Prior to Visit  Medication Sig Dispense Refill   ACCU-CHEK GUIDE test strip USE TO CHECK SUGAR 4 TIMES A DAY 100 strip 4   aspirin (ASPIRIN LOW DOSE) 81 MG EC tablet Take 1 tablet (81 mg total) by mouth daily. Swallow whole. 90 tablet 3   atorvastatin (LIPITOR) 20 MG tablet TAKE 1 TABLET BY MOUTH DAILY  EXCEPT 1/2 TABLET ON TUESDAY, THURSDAY AND SATURDAY. 24 tablet 8   carvedilol (COREG) 25 MG tablet TAKE 1 TABLET TWICE DAILY WITH MEALS 180 tablet 1   digoxin (LANOXIN) 0.125 MG tablet Take 0.5 tablets (0.0625 mg total) by mouth daily. 45 tablet 3   docusate sodium (COLACE) 100 MG capsule Take 1 capsule (100 mg total) by mouth 2 (two) times daily as needed for mild constipation. 60 capsule 2   donepezil (ARICEPT) 10 MG tablet TAKE HALF TABLET (5 MG) DAILY FOR 2 WEEKS, THEN INCREASE TO THE FULL TABLET AT 10 MG DAILY 90 tablet 1   famotidine (PEPCID) 20 MG tablet Take 1 tablet (20 mg total) by mouth daily. 90 tablet 3   gabapentin (NEURONTIN) 100 MG capsule Take 1 capsule (100 mg total) by mouth 2 (two) times daily. 60 capsule 2   Lancets (ONETOUCH ULTRASOFT) lancets Use as instructed to test sugar daily and prn. Dx E11.9 100 each 12   losartan (COZAAR) 50 MG tablet Take 1 tablet (50 mg total) by mouth at bedtime. 90 tablet 3   omeprazole (PRILOSEC) 20 MG capsule TAKE 1 CAPSULE DAILY 30 MINUTES BEFORE BREAKFAST 90 capsule 1   sitaGLIPtin-metformin (JANUMET) 50-1000 MG tablet Take 1 tablet by mouth daily. Take with a meal 30 tablet 8   sotalol (BETAPACE) 120 MG tablet TAKE 1 TABLET TWICE DAILY 120 tablet 2   traMADol (ULTRAM) 50 MG tablet Take 1 tablet (50 mg total) by mouth every 6 (six) hours as needed. 30 tablet 0   vitamin B-12 (CYANOCOBALAMIN) 1000 MCG tablet TAKE 1 TABLET EVERY DAY 90 tablet 1   No facility-administered medications prior to visit.    Allergies  Allergen Reactions    Zantac [Ranitidine Hcl]     itching    ROS     Objective:    Physical Exam  BP (!) 150/84 (BP Location: Left Arm, Patient Position: Sitting, Cuff Size: Large)   Pulse 60   Temp 97.6 F (36.4 C) (Temporal)   Ht '5\' 8"'$  (1.727 m)   Wt 189 lb (85.7 kg)   SpO2 98%   BMI 28.74 kg/m  Wt Readings from Last 3 Encounters:  12/19/21 189 lb (85.7 kg)  12/16/21 189 lb (85.7 kg)  12/11/21 191 lb (86.6 kg)       Assessment & Plan:   Problem List Items Addressed This Visit   None Visit Diagnoses     Rectal pain    -  Primary   Relevant Orders   Ambulatory referral to Gastroenterology   CBC with Differential/Platelet   POCT occult blood stool   Hx of prostatectomy       History of rectal abscess       Relevant Orders   CBC with Differential/Platelet       I am having [name] maintain his onetouch ultrasoft, Janumet, docusate sodium, Accu-Chek Guide, atorvastatin, famotidine, aspirin EC, sotalol, gabapentin, traMADol, omeprazole, cyanocobalamin, carvedilol, donepezil, losartan, and digoxin.  No orders of the defined types were placed in this encounter.

## 2021-12-21 DIAGNOSIS — K6289 Other specified diseases of anus and rectum: Secondary | ICD-10-CM

## 2021-12-21 HISTORY — DX: Other specified diseases of anus and rectum: K62.89

## 2021-12-21 NOTE — Procedures (Signed)
EMG & NCV Findings: Evaluation of the left tibial motor and the right superficial fibular sensory nerves showed reduced amplitude (L0.3, R2.4 V).  The right tibial motor nerve showed prolonged distal onset latency (6.3 ms) and reduced amplitude (0.3 mV).  The left superficial fibular sensory nerve showed prolonged distal peak latency (4.8 ms) and decreased conduction velocity (14 cm-Ant Lat Mall, 29 m/s).  The left sural sensory and the right sural sensory nerves showed no response (Calf).  All remaining nerves (as indicated in the following tables) were within normal limits.  Left vs. Right side comparison data for the tibial motor nerve indicates abnormal L-R latency difference (1.8 ms) and abnormal L-R velocity difference (Knee-Ankle, 11 m/s).    Needle evaluation of the left anterior tibialis and the left Fibularis Longus muscles showed increased insertional activity.  All remaining muscles (as indicated in the following table) showed no evidence of electrical instability.    Impression: The above electrodiagnostic study is ABNORMAL and reveals evidence of mild chronic L5 radiculopathy on the left.    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   There is no significant electrodiagnostic evidence of any other focal nerve entrapment or lumbosacral plexopathy.   Recommendations: 1.  Follow-up with referring physician. 2.  Continue current management of symptoms.  ___________________________ Laurence Spates FAAPMR Board Certified, American Board of Physical Medicine and Rehabilitation    Nerve Conduction Studies Anti Sensory Summary Table   Stim Site NR Peak (ms) Norm Peak (ms) P-T Amp (V) Norm P-T Amp Site1 Site2 Delta-P (ms) Dist (cm) Vel (m/s) Norm Vel (m/s)  Left Sup Fibular Anti Sensory (Ant Lat Mall)  29.7C  14 cm    *4.8 <4.4 13.5 >5.0 14 cm Ant Lat Mall 4.8 14.0 *29 >32  Right Sup Fibular Anti  Sensory (Ant Lat Mall)  29.7C  14 cm    3.9 <4.4 *2.4 >5.0 14 cm Ant Lat Mall 3.9 14.0 36 >32  Left Sural Anti Sensory (Lat Mall)  29.7C  Calf *NR  <4.0  >5.0 Calf Lat Mall  14.0  >35  Right Sural Anti Sensory (Lat Mall)  29.8C  Calf *NR  <4.0  >5.0 Calf Lat Mall  14.0  >35   Motor Summary Table   Stim Site NR Onset (ms) Norm Onset (ms) O-P Amp (mV) Norm O-P Amp Site1 Site2 Delta-0 (ms) Dist (cm) Vel (m/s) Norm Vel (m/s)  Right Dp Br Fibular Motor (AntTibialis)  29.7C  Fib Head    3.6 <4.2 1.2  Poplit Fib Head 1.9 9.0 47 >40.5  Poplit    5.5 <5.7 0.6         Left Fibular Motor (Ext Dig Brev)  29.2C  Ankle    3.0 <6.1 3.2 >2.5 B Fib Ankle 2.0 9.0 45 >38  B Fib    5.0  3.1         Left Tibial Motor (Abd Hall Brev)  29.3C  Ankle    4.5 <6.1 *0.3 >3.0 Knee Ankle 9.0 42.0 47 >35  Knee    13.5  4.6         Right Tibial Motor (Abd Hall Brev)  29.9C  Ankle    *6.3 <6.1 *0.3 >3.0 Knee Ankle 7.8 45.0 58 >35  Knee    14.1  2.3          EMG   Side Muscle Nerve Root Ins Act Fibs Psw Amp Dur Poly Recrt Int Fraser Din  Comment  Left AntTibialis Dp Br Peron L4-5 *Incr Nml Nml Nml Nml 0 Nml Nml   Left Fibularis Longus  Sup Br Peron L5-S1 *Incr Nml Nml Nml Nml 0 Nml Nml   Left MedGastroc Tibial S1-2 Nml Nml Nml Nml Nml 0 Nml Nml   Left VastusMed Femoral L2-4 Nml Nml Nml Nml Nml 0 Nml Nml   Left BicepsFemS Sciatic L5-S1 Nml Nml Nml Nml Nml 0 Nml Nml   Right AntTibialis Dp Br Peron L4-5 Nml Nml Nml Nml Nml 0 Nml Nml   Right Fibularis Longus  Sup Br Peron L5-S1 Nml Nml Nml Nml Nml 0 Nml Nml   Right MedGastroc Tibial S1-2 Nml Nml Nml Nml Nml 0 Nml Nml   Right VastusMed Femoral L2-4 Nml Nml Nml Nml Nml 0 Nml Nml   Right BicepsFemS Sciatic L5-S1 Nml Nml Nml Nml Nml 0 Nml Nml   Right GluteusMed SupGluteal L4-S1 Nml Nml Nml Nml Nml 0 Nml Nml     Nerve Conduction Studies Anti Sensory Left/Right Comparison   Stim Site L Lat (ms) R Lat (ms) L-R Lat (ms) L Amp (V) R Amp (V) L-R Amp (%) Site1 Site2 L Vel  (m/s) R Vel (m/s) L-R Vel (m/s)  Sup Fibular Anti Sensory (Ant Lat Mall)  29.7C  14 cm *4.8 3.9 0.9 13.5 *2.4 82.2 14 cm Ant Lat Mall *29 36 7  Sural Anti Sensory (Lat Mall)  29.7C  Calf       Calf Lat Mall      Motor Left/Right Comparison   Stim Site L Lat (ms) R Lat (ms) L-R Lat (ms) L Amp (mV) R Amp (mV) L-R Amp (%) Site1 Site2 L Vel (m/s) R Vel (m/s) L-R Vel (m/s)  Dp Br Fibular Motor (AntTibialis)  29.7C  Fib Head  3.6   1.2  Poplit Fib Head  47   Poplit  5.5   0.6        Fibular Motor (Ext Dig Brev)  29.2C  Ankle 3.0   3.2   B Fib Ankle 45    B Fib 5.0   3.1         Tibial Motor (Abd Hall Brev)  29.3C  Ankle 4.5 *6.3 *1.8 *0.3 *0.3 0.0 Knee Ankle 47 58 *11  Knee 13.5 14.1 0.6 4.6 2.3 50.0          Waveforms:

## 2021-12-21 NOTE — Progress Notes (Signed)
[name] - 71 y.o. male MRN [id number]  Date of birth: December 28, 1950  Office Visit Note: Visit Date: 12/18/2021 PCP: Binnie Rail, MD Referred by: Garald Balding, MD  Subjective: Chief Complaint  Patient presents with   Lower Back - Pain, Weakness   Right Leg - Weakness, Pain   Left Leg - Pain, Weakness   HPI:  [name] is a 71 y.o. male who comes in today at the request of Dr. Joni Fears for electrodiagnostic study of the Bilateral lower extremities.  He is present with his wife who provides some of the history.  He is referred mainly for weakness or discoordination of ambulating and walking.  His biggest complaint is when he walks he tends to go to the right.  No foot drop noted.  No prior electrodiagnostic studies.  Patient is followed by Sherrilyn Rist, PA-C [facility] for Dr. Delice Lesch.  In reviewing their notes from late June they also wanted electrodiagnostic studies of the lower limbs.  Patient was evaluated by Dr. Durward Fortes from an orthopedic standpoint he suggested electrodiagnostic studies as well.  Original order sent to [place] was for upper extremities and I think this was an error.  Unfortunately studies probably would have been better performed at the neurology office but we do have the patient today and happy to do the testing today.  I will get the information to his neurologist.  He has mainly been seeing the neurologist for mild memory impairment.   ROS Otherwise per HPI.  Assessment & Plan: Visit Diagnoses:    ICD-10-CM   1. Paresthesia of skin  R20.2 NCV with EMG (electromyography)    2. Weakness  R53.1 NCV with EMG (electromyography)    3. Gait abnormality  R26.9 NCV with EMG (electromyography)      Plan: Impression: The above electrodiagnostic study is ABNORMAL and reveals evidence of mild chronic L5 radiculopathy on the left.    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   There is no significant electrodiagnostic evidence of any other focal nerve entrapment or lumbosacral plexopathy.   Recommendations: 1.  Follow-up with referring physician. 2.  Continue current management of symptoms.  Meds & Orders: No orders of the defined types were placed in this encounter.   Orders Placed This Encounter  Procedures   NCV with EMG (electromyography)    Follow-up: Return in about 2 weeks (around 01/01/2022) for Joni Fears, MD.   Procedures: No procedures performed  EMG & NCV Findings: Evaluation of the left tibial motor and the right superficial fibular sensory nerves showed reduced amplitude (L0.3, R2.4 V).  The right tibial motor nerve showed prolonged distal onset latency (6.3 ms) and reduced amplitude (0.3 mV).  The left superficial fibular sensory nerve showed prolonged distal peak latency (4.8 ms) and decreased conduction velocity (14 cm-Ant Lat Mall, 29 m/s).  The left sural sensory and the right sural sensory nerves showed no response (Calf).  All remaining nerves (as indicated in the following tables) were within normal limits.  Left vs. Right side comparison data for the tibial motor nerve indicates abnormal L-R latency difference (1.8 ms) and abnormal L-R velocity difference (Knee-Ankle, 11 m/s).    Needle evaluation of the left anterior tibialis and the left Fibularis Longus muscles showed increased insertional activity.  All remaining muscles (as indicated in the following table) showed no evidence of electrical instability.    Impression: The above electrodiagnostic  study is ABNORMAL and reveals evidence of mild chronic L5 radiculopathy on the left.    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   There is no significant electrodiagnostic evidence of any other focal nerve entrapment or lumbosacral plexopathy.   Recommendations: 1.  Follow-up with  referring physician. 2.  Continue current management of symptoms.  ___________________________ Laurence Spates FAAPMR Board Certified, American Board of Physical Medicine and Rehabilitation    Nerve Conduction Studies Anti Sensory Summary Table   Stim Site NR Peak (ms) Norm Peak (ms) P-T Amp (V) Norm P-T Amp Site1 Site2 Delta-P (ms) Dist (cm) Vel (m/s) Norm Vel (m/s)  Left Sup Fibular Anti Sensory (Ant Lat Mall)  29.7C  14 cm    *4.8 <4.4 13.5 >5.0 14 cm Ant Lat Mall 4.8 14.0 *29 >32  Right Sup Fibular Anti Sensory (Ant Lat Mall)  29.7C  14 cm    3.9 <4.4 *2.4 >5.0 14 cm Ant Lat Mall 3.9 14.0 36 >32  Left Sural Anti Sensory (Lat Mall)  29.7C  Calf *NR  <4.0  >5.0 Calf Lat Mall  14.0  >35  Right Sural Anti Sensory (Lat Mall)  29.8C  Calf *NR  <4.0  >5.0 Calf Lat Mall  14.0  >35   Motor Summary Table   Stim Site NR Onset (ms) Norm Onset (ms) O-P Amp (mV) Norm O-P Amp Site1 Site2 Delta-0 (ms) Dist (cm) Vel (m/s) Norm Vel (m/s)  Right Dp Br Fibular Motor (AntTibialis)  29.7C  Fib Head    3.6 <4.2 1.2  Poplit Fib Head 1.9 9.0 47 >40.5  Poplit    5.5 <5.7 0.6         Left Fibular Motor (Ext Dig Brev)  29.2C  Ankle    3.0 <6.1 3.2 >2.5 B Fib Ankle 2.0 9.0 45 >38  B Fib    5.0  3.1         Left Tibial Motor (Abd Hall Brev)  29.3C  Ankle    4.5 <6.1 *0.3 >3.0 Knee Ankle 9.0 42.0 47 >35  Knee    13.5  4.6         Right Tibial Motor (Abd Hall Brev)  29.9C  Ankle    *6.3 <6.1 *0.3 >3.0 Knee Ankle 7.8 45.0 58 >35  Knee    14.1  2.3          EMG   Side Muscle Nerve Root Ins Act Fibs Psw Amp Dur Poly Recrt Int Fraser Din Comment  Left AntTibialis Dp Br Peron L4-5 *Incr Nml Nml Nml Nml 0 Nml Nml   Left Fibularis Longus  Sup Br Peron L5-S1 *Incr Nml Nml Nml Nml 0 Nml Nml   Left MedGastroc Tibial S1-2 Nml Nml Nml Nml Nml 0 Nml Nml   Left VastusMed Femoral L2-4 Nml Nml Nml Nml Nml 0 Nml Nml   Left BicepsFemS Sciatic L5-S1 Nml Nml Nml Nml Nml 0 Nml Nml   Right AntTibialis Dp Br Peron L4-5 Nml  Nml Nml Nml Nml 0 Nml Nml   Right Fibularis Longus  Sup Br Peron L5-S1 Nml Nml Nml Nml Nml 0 Nml Nml   Right MedGastroc Tibial S1-2 Nml Nml Nml Nml Nml 0 Nml Nml   Right VastusMed Femoral L2-4 Nml Nml Nml Nml Nml 0 Nml Nml   Right BicepsFemS Sciatic L5-S1 Nml Nml Nml Nml Nml 0 Nml Nml   Right GluteusMed SupGluteal L4-S1 Nml Nml Nml Nml Nml  0 Nml Nml     Nerve Conduction Studies Anti Sensory Left/Right Comparison   Stim Site L Lat (ms) R Lat (ms) L-R Lat (ms) L Amp (V) R Amp (V) L-R Amp (%) Site1 Site2 L Vel (m/s) R Vel (m/s) L-R Vel (m/s)  Sup Fibular Anti Sensory (Ant Lat Mall)  29.7C  14 cm *4.8 3.9 0.9 13.5 *2.4 82.2 14 cm Ant Lat Mall *29 36 7  Sural Anti Sensory (Lat Mall)  29.7C  Calf       Calf Lat Mall      Motor Left/Right Comparison   Stim Site L Lat (ms) R Lat (ms) L-R Lat (ms) L Amp (mV) R Amp (mV) L-R Amp (%) Site1 Site2 L Vel (m/s) R Vel (m/s) L-R Vel (m/s)  Dp Br Fibular Motor (AntTibialis)  29.7C  Fib Head  3.6   1.2  Poplit Fib Head  47   Poplit  5.5   0.6        Fibular Motor (Ext Dig Brev)  29.2C  Ankle 3.0   3.2   B Fib Ankle 45    B Fib 5.0   3.1         Tibial Motor (Abd Hall Brev)  29.3C  Ankle 4.5 *6.3 *1.8 *0.3 *0.3 0.0 Knee Ankle 47 58 *11  Knee 13.5 14.1 0.6 4.6 2.3 50.0          Waveforms:                  Clinical History: No specialty comments available.     Objective:  VS:  HT:    WT:   BMI:     BP:   HR: bpm  TEMP: ( )  RESP:  Physical Exam Vitals and nursing note reviewed.  Constitutional:      General: He is not in acute distress.    Appearance: Normal appearance. He is not ill-appearing.  HENT:     Head: Normocephalic and atraumatic.     Right Ear: External ear normal.     Left Ear: External ear normal.     Nose: No congestion.  Eyes:     Extraocular Movements: Extraocular movements intact.  Cardiovascular:     Rate and Rhythm: Normal rate.     Pulses: Normal pulses.  Pulmonary:     Effort: Pulmonary effort  is normal. No respiratory distress.  Abdominal:     General: There is no distension.     Palpations: Abdomen is soft.  Musculoskeletal:        General: No tenderness or signs of injury.     Cervical back: Neck supple.     Right lower leg: Edema present.     Left lower leg: Edema present.     Comments: Patient has good distal strength without clonus.  Negative Babinski.  No pain with hip rotation.  Has good proximal strength with hip flexion knee flexion extension.  Does have some bilateral foot atrophy intrinsically.  Skin:    Findings: No erythema or rash.  Neurological:     General: No focal deficit present.     Mental Status: He is alert and oriented to person, place, and time.     Cranial Nerves: No cranial nerve deficit.     Sensory: No sensory deficit.     Motor: No weakness or abnormal muscle tone.     Coordination: Coordination normal.     Gait: Gait abnormal.  Psychiatric:  Mood and Affect: Mood normal.        Behavior: Behavior normal.      Imaging: No results found.

## 2021-12-21 NOTE — Assessment & Plan Note (Signed)
Pain with sitting only. Stat WBC normal. No sign of infection. Negative hemoccult.   Possible etiologies include internal hemorrhoids or fissure although not obvious on exam, MSK.  No sign of abscess.  Referral to GI.

## 2021-12-23 ENCOUNTER — Encounter: Payer: Self-pay | Admitting: Psychology

## 2021-12-29 NOTE — Progress Notes (Unsigned)
Cardiology Office Note Date:  12/29/2021  Patient ID:  [name], [name] 07/18/1950, MRN [id number] PCP:  Binnie Rail, MD  Cardiologist:  Dr. Lovena Le     Chief Complaint: recurrent near syncope  History of Present Illness: [name] is a 71 y.o. male with history of anxiety, arthritis, GERD, HTN, HLD, NICM, chronic CHF (systolic), VT, ICD, OSA intolerant of CPAP  He comes in today to be seen for Dr. Lovena Le, last seen by him jan 2023, , back at work, had lost some weight, no sustained VT/therapies.  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 Elsworth Soho 11/20/21, working on getting a CPAP mask that won't leak/that he can tolerate  Called 12/01/21 with what sounds like perhaps a couple months of near syncope? That he will put cold compress/ice on the back of his neck or sit in the cooler at work for a bit that helps   I saw him 12/02/21 He is accompanied by his wife He reports a couple months of dizziness upon standing sometimes to the point of near syncope, this has been going on a few months, by his report, his wife recalls episodes started back in October. They both agree that the frequency and severity though are both increasing  in the last 2 weeks to every time he stands up he gets lightheaded, and that the recovery is taking longer He has not worked on a couple weeks, getting lightheaded so often. He is very clear that the dizziness/lightheaded weak spells ONLY happen from sitting to standing, and as he gets up and starts to walk he is very weak, unsteady, feels hot, and has to sit down to feel better, sometimes will seek out cooler places like the cooler at work. Though does not have to be a hot environment, feels hot and looks to sit down and cool off. He has not fainted or fallen, but has stumbled. His wife points out that he also has a gait instability that pre-dates the orthostatic symptoms and they  report that they have discussed this with neurology who felt perhaps a back problem?  He is seeing orthopedics with arthritic in back and pending further eval. NO CP, NO SOB, NO palpitations with or without the symptoms of dizziness/weakness Felt to be dehydrated, + orthostatic with minimal symptoms st the time of his visit Amlodipine stopped Counseled on adequate hydration  He saw A. Tillery, PA-C 12/11/21 + ongoing symptoms, again markedly abnormal orthostatic BP Ramipril changed to losartan daily Re discussed better hydration Recommended compression sleeves Recommended earlier neurology f/u  Saw neurology team 12/16/21, no significant discussion regarding his orthostatic issue other then to stand stand slow Seems f/u conversations regarding getting neurocognitive testing done.  Saw PMD 12/19/21 though focused visit for rectal pain only  *** orthostatics *** symptoms *** hydration *** compression   Device information MDT single chamber ICD, current system implanted 10/07/2012 Initial device implant was ?2005, 6949 lead was capped/abandoned AAD 2012, VT storm, numerous shocks, started on sotalol  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   Not compatible with MRI    Barrett's esophagus 09/05/2004   2 cm changes seen at index EGD   Cataract    Chronic back pain 40/98/1191   Chronic systolic heart failure    Colon polyps    tubular adenoma  Dysrhythmia    Essential hypertension 08/26/2009   Fatigue 12/02/2020   Generalized anxiety disorder    since defibrillator placement   GERD (gastroesophageal reflux disease)    Hip pain 12/14/2016   History of COVID-19    History of myocardial infarction    Hyperlipidemia    Left knee pain 12/14/2016   Malignant neoplasm of prostate 09/27/2014   s/p prostatectomy   Mild anemia 01/20/2011   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 b. s/p Medtronic ICD.   Obstructive sleep apnea 11/10/2012   no CPAP use   Right foot pain 12/15/2017   RLS (restless legs syndrome) 11/29/2012   S/P radiation therapy    10/10/2014 through 11/26/2014; Prostate bed 6600 cGy in 33 sessions   Shingles 07/28/2016   Type II diabetes mellitus 08/26/2009   Ventricular tachycardia ([place])    a. s/p ICD (generator change 09/2012). b. 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 and placed on sotalol   Vitamin B 12 deficiency 03/06/2011   Monthly B12 shots initiated  2011. Nasally  inhaled B12 as of 07/2012    Past Surgical History:  Procedure Laterality Date   CARDIAC DEFIBRILLATOR PLACEMENT  03/11/2004   Medtronic Maximo single lead   COLONOSCOPY W/ BIOPSIES AND POLYPECTOMY  08/2004, 02/24/11   62m adenoma in 2006,  5 mm polyp (not recovered) 2012   CGreerINTERNAL URETHROTOMY N/A 10/08/2015   Procedure: CYSTOSCOPY WITH DIRECT VISION INTERNAL URETHROTOMY;  Surgeon: BNickie Retort MD;  Location: [facility];  Service: Urology;  Laterality: N/A;   IMPLANTABLE CARDIOVERTER DEFIBRILLATOR GENERATOR CHANGE N/A 10/07/2012   Procedure: IMPLANTABLE CARDIOVERTER DEFIBRILLATOR GENERATOR CHANGE;  Surgeon: GEvans Lance MD;  [facility];  Service: Cardiovascular;  Laterality: N/A;   PROSTATECTOMY  06/2003   Dr NJanice Norrie  RECTAL SURGERY     UPPER GASTROINTESTINAL ENDOSCOPY  08/2004, 02/24/11   Barrett's esophagus    Current Outpatient Medications  Medication Sig Dispense Refill   ACCU-CHEK GUIDE test strip USE TO CHECK SUGAR 4 TIMES A DAY 100 strip 4   aspirin (ASPIRIN LOW DOSE) 81 MG EC tablet Take 1 tablet (81 mg total) by mouth daily. Swallow whole. 90 tablet 3   atorvastatin (LIPITOR) 20 MG tablet TAKE 1 TABLET BY MOUTH DAILY EXCEPT 1/2 TABLET ON TUESDAY, THURSDAY AND SATURDAY. 24 tablet 8   carvedilol (COREG) 25 MG tablet  TAKE 1 TABLET TWICE DAILY WITH MEALS 180 tablet 1   digoxin (LANOXIN) 0.125 MG tablet Take 0.5 tablets (0.0625 mg total) by mouth daily. 45 tablet 3   docusate sodium (COLACE) 100 MG capsule Take 1 capsule (100 mg total) by mouth 2 (two) times daily as needed for mild constipation. 60 capsule 2   donepezil (ARICEPT) 10 MG tablet TAKE HALF TABLET (5 MG) DAILY FOR 2 WEEKS, THEN INCREASE TO THE FULL TABLET AT 10 MG DAILY 90 tablet 1   famotidine (PEPCID) 20 MG tablet Take 1 tablet (20 mg total) by mouth daily. 90 tablet 3   gabapentin (NEURONTIN) 100 MG capsule Take 1 capsule (100 mg total) by mouth 2 (two) times daily. 60 capsule 2   Lancets (ONETOUCH ULTRASOFT) lancets Use as instructed to test sugar daily and prn. Dx E11.9 100 each 12   losartan (COZAAR) 50 MG tablet Take 1 tablet (50 mg total) by mouth at bedtime. 90 tablet 3   omeprazole (PRILOSEC) 20 MG  capsule TAKE 1 CAPSULE DAILY 30 MINUTES BEFORE BREAKFAST 90 capsule 1   sitaGLIPtin-metformin (JANUMET) 50-1000 MG tablet Take 1 tablet by mouth daily. Take with a meal 30 tablet 8   sotalol (BETAPACE) 120 MG tablet TAKE 1 TABLET TWICE DAILY 120 tablet 2   traMADol (ULTRAM) 50 MG tablet Take 1 tablet (50 mg total) by mouth every 6 (six) hours as needed. 30 tablet 0   vitamin B-12 (CYANOCOBALAMIN) 1000 MCG tablet TAKE 1 TABLET EVERY DAY 90 tablet 1   No current facility-administered medications for this visit.    Allergies:   Zantac [ranitidine hcl]   Social History:  The patient  reports that he has never smoked. He has never used smokeless tobacco. He reports that he does not drink alcohol and does not use drugs.   Family History:  The patient's family history includes Coronary artery disease in his father; Dementia in his brother, maternal aunt, mother, and sister; Diabetes in his father and paternal grandmother; Hypertension in his mother; Prostate cancer in his father.  ROS:  Please see the history of present illness.  All other systems  are reviewed and otherwise negative.   PHYSICAL EXAM:  VS:  There were no vitals taken for this visit. BMI: There is no height or weight on file to calculate BMI. Well nourished, well developed, in no acute distress  HEENT: normocephalic, atraumatic  Neck: no JVD, carotid bruits or masses Cardiac:  *** RRR; no significant murmurs, no rubs, or gallops Lungs:  *** CTA b/l, no wheezing, rhonchi or rales  Abd: soft, nontender, obese MS: no deformity or atrophy Ext:  *** no edema  Skin: warm and dry, no rash Neuro:  No gross deficits appreciated Psych: euthymic mood, full affect  *** ICD site is stable, no tethering or discomfort   EKG:  Done today and reviewed by myself;   ***  ICD interrogation done today and reviewed by myself;  ***     05/24/2003: TTE SUMMARY   -  Overall left ventricular systolic function was moderately to         markedly decreased. Left ventricular ejection fraction was         estimated , range being 25 % to 35 %. There was moderate         diffuse left ventricular hypokinesis.    Recent Labs: 08/04/2021: ALT 15; TSH 3.31 12/02/2021: BUN 8; Creatinine, Ser 1.07; Potassium 4.3; Sodium 144 12/19/2021: Hemoglobin 11.9; Platelets 215.0  08/04/2021: Cholesterol 99; HDL 40.00; LDL Cholesterol 45; Total CHOL/HDL Ratio 2; Triglycerides 69.0; VLDL 13.8   CrCl cannot be calculated (Patient's most recent lab result is older than the maximum 21 days allowed.).   Wt Readings from Last 3 Encounters:  12/19/21 189 lb (85.7 kg)  12/16/21 189 lb (85.7 kg)  12/11/21 191 lb (86.6 kg)     Other studies reviewed: Additional studies/records reviewed today include: summarized above  ASSESSMENT AND PLAN:  1. ICD 2. H/o VT storm     On sotalol, *** stable QT     *** Rare NSVT only  3. NICM 4. Chronic CF (systolic)     No symptoms or exam findings of volume OL     *** OptiVol is way down, suspect dehydrated     *** On BB/ACE, dig  Labs today  5. HTN     +  orthostatic BP changes,      ***  mild symptoms today  6. recurrent near syncope He describes orthostatic  symptoms *** I have urged that he increase water intake OptiVol is way down and he is likely dehydrated *** Monitor symptoms and as he has been doing sit until resolved, advised standing slowly, monitoring symptoms prior to walking away from his chair         Disposition: ***    Current medicines are reviewed at length with the patient today.  The patient did not have any concerns regarding medicines.  Venetia Night, PA-C 12/29/2021 1:17 PM     [address] [place] [id number] [phone] (office)  [phone] (fax)

## 2021-12-31 ENCOUNTER — Encounter: Payer: Medicare PPO | Admitting: Physician Assistant

## 2022-01-01 ENCOUNTER — Encounter: Payer: Self-pay | Admitting: Physician Assistant

## 2022-01-01 ENCOUNTER — Ambulatory Visit: Payer: Medicare PPO | Admitting: Physician Assistant

## 2022-01-01 VITALS — BP 108/68 | HR 69 | Ht 71.0 in | Wt 189.0 lb

## 2022-01-01 DIAGNOSIS — Z79899 Other long term (current) drug therapy: Secondary | ICD-10-CM | POA: Diagnosis not present

## 2022-01-01 DIAGNOSIS — I428 Other cardiomyopathies: Secondary | ICD-10-CM | POA: Diagnosis not present

## 2022-01-01 DIAGNOSIS — Z95810 Presence of automatic (implantable) cardiac defibrillator: Secondary | ICD-10-CM

## 2022-01-01 DIAGNOSIS — I5022 Chronic systolic (congestive) heart failure: Secondary | ICD-10-CM | POA: Diagnosis not present

## 2022-01-01 DIAGNOSIS — I951 Orthostatic hypotension: Secondary | ICD-10-CM

## 2022-01-01 DIAGNOSIS — I472 Ventricular tachycardia, unspecified: Secondary | ICD-10-CM

## 2022-01-01 LAB — CUP PACEART INCLINIC DEVICE CHECK
Battery Remaining Longevity: 36 mo
Battery Voltage: 2.95 V
Brady Statistic RV Percent Paced: 0.04 %
Date Time Interrogation Session: 20230824131900
HighPow Impedance: 60 Ohm
Implantable Lead Implant Date: 20140530
Implantable Lead Location: 753860
Implantable Lead Model: 6935
Implantable Pulse Generator Implant Date: 20140530
Lead Channel Impedance Value: 399 Ohm
Lead Channel Impedance Value: 456 Ohm
Lead Channel Pacing Threshold Amplitude: 0.5 V
Lead Channel Pacing Threshold Pulse Width: 0.4 ms
Lead Channel Sensing Intrinsic Amplitude: 10.25 mV
Lead Channel Sensing Intrinsic Amplitude: 6.375 mV
Lead Channel Setting Pacing Amplitude: 2.5 V
Lead Channel Setting Pacing Pulse Width: 0.4 ms
Lead Channel Setting Sensing Sensitivity: 0.3 mV

## 2022-01-01 MED ORDER — LOSARTAN POTASSIUM 25 MG PO TABS: 25.0000 mg | ORAL_TABLET | Freq: Every day | ORAL | 2 refills | Status: DC

## 2022-01-01 NOTE — Patient Instructions (Addendum)
Medication Instructions:    START TAKING:  LOSARTAN 25 MG ONCE A DAY   *If you need a refill on your cardiac medications before your next appointment, please call your pharmacy*   Lab Work: NONE ORDERED  TODAY    If you have labs (blood work) drawn today and your tests are completely normal, you will receive your results only by: [place] (if you have MyChart) OR A paper copy in the mail If you have any lab test that is abnormal or we need to change your treatment, we will call you to review the results.   Testing/Procedures: NONE ORDERED  TODAY    Follow-Up: At [facility], you and your health needs are our priority.  As part of our continuing mission to provide you with exceptional heart care, we have created designated Provider Care Teams.  These Care Teams include your primary Cardiologist (physician) and Advanced Practice Providers (APPs -  Physician Assistants and Nurse Practitioners) who all work together to provide you with the care you need, when you need it.  We recommend signing up for the patient portal called "MyChart".  Sign up information is provided on this After Visit Summary.  MyChart is used to connect with patients for Virtual Visits (Telemedicine).  Patients are able to view lab/test results, encounter notes, upcoming appointments, etc.  Non-urgent messages can be sent to your provider as well.   To learn more about what you can do with MyChart, go to [email or web address].    Your next appointment:    2 month(s) ( CONTACT [facility] FOR EP SCHEDULING ISSUES )   The format for your next appointment:   In Person  Provider:   You may see Dr. Lovena Le  or one of the following Advanced Practice Providers on your designated Care Team:   Tommye Standard, [place] Legrand Como "Jonni Sanger" Chalmers Cater, PA-C{   Other Instructions   Important Information About Sugar

## 2022-01-05 ENCOUNTER — Emergency Department (HOSPITAL_BASED_OUTPATIENT_CLINIC_OR_DEPARTMENT_OTHER): Payer: Medicare PPO

## 2022-01-05 ENCOUNTER — Inpatient Hospital Stay (HOSPITAL_BASED_OUTPATIENT_CLINIC_OR_DEPARTMENT_OTHER)
Admission: EM | Admit: 2022-01-05 | Discharge: 2022-01-07 | DRG: 069 | Disposition: A | Discharge status: Home Health | Payer: Medicare PPO | Attending: Family Medicine | Admitting: Family Medicine

## 2022-01-05 ENCOUNTER — Encounter (HOSPITAL_BASED_OUTPATIENT_CLINIC_OR_DEPARTMENT_OTHER): Payer: Self-pay | Admitting: Emergency Medicine

## 2022-01-05 DIAGNOSIS — Z923 Personal history of irradiation: Secondary | ICD-10-CM

## 2022-01-05 DIAGNOSIS — Z8249 Family history of ischemic heart disease and other diseases of the circulatory system: Secondary | ICD-10-CM

## 2022-01-05 DIAGNOSIS — G3183 Dementia with Lewy bodies: Secondary | ICD-10-CM | POA: Diagnosis present

## 2022-01-05 DIAGNOSIS — F05 Delirium due to known physiological condition: Secondary | ICD-10-CM | POA: Diagnosis present

## 2022-01-05 DIAGNOSIS — Z8042 Family history of malignant neoplasm of prostate: Secondary | ICD-10-CM

## 2022-01-05 DIAGNOSIS — F0280 Dementia in other diseases classified elsewhere without behavioral disturbance: Secondary | ICD-10-CM | POA: Diagnosis present

## 2022-01-05 DIAGNOSIS — Z20822 Contact with and (suspected) exposure to covid-19: Secondary | ICD-10-CM | POA: Diagnosis present

## 2022-01-05 DIAGNOSIS — E119 Type 2 diabetes mellitus without complications: Secondary | ICD-10-CM | POA: Diagnosis present

## 2022-01-05 DIAGNOSIS — E785 Hyperlipidemia, unspecified: Secondary | ICD-10-CM | POA: Diagnosis present

## 2022-01-05 DIAGNOSIS — G459 Transient cerebral ischemic attack, unspecified: Principal | ICD-10-CM | POA: Diagnosis present

## 2022-01-05 DIAGNOSIS — I110 Hypertensive heart disease with heart failure: Secondary | ICD-10-CM | POA: Diagnosis present

## 2022-01-05 DIAGNOSIS — I480 Paroxysmal atrial fibrillation: Secondary | ICD-10-CM | POA: Diagnosis present

## 2022-01-05 DIAGNOSIS — I252 Old myocardial infarction: Secondary | ICD-10-CM | POA: Diagnosis not present

## 2022-01-05 DIAGNOSIS — R29898 Other symptoms and signs involving the musculoskeletal system: Secondary | ICD-10-CM

## 2022-01-05 DIAGNOSIS — Z79899 Other long term (current) drug therapy: Secondary | ICD-10-CM

## 2022-01-05 DIAGNOSIS — Z8546 Personal history of malignant neoplasm of prostate: Secondary | ICD-10-CM

## 2022-01-05 DIAGNOSIS — Z7984 Long term (current) use of oral hypoglycemic drugs: Secondary | ICD-10-CM

## 2022-01-05 DIAGNOSIS — G4733 Obstructive sleep apnea (adult) (pediatric): Secondary | ICD-10-CM | POA: Diagnosis present

## 2022-01-05 DIAGNOSIS — R29818 Other symptoms and signs involving the nervous system: Secondary | ICD-10-CM | POA: Diagnosis not present

## 2022-01-05 DIAGNOSIS — Z820 Family history of epilepsy and other diseases of the nervous system: Secondary | ICD-10-CM

## 2022-01-05 DIAGNOSIS — R479 Unspecified speech disturbances: Secondary | ICD-10-CM

## 2022-01-05 DIAGNOSIS — Z888 Allergy status to other drugs, medicaments and biological substances status: Secondary | ICD-10-CM

## 2022-01-05 DIAGNOSIS — I428 Other cardiomyopathies: Secondary | ICD-10-CM

## 2022-01-05 DIAGNOSIS — D649 Anemia, unspecified: Secondary | ICD-10-CM | POA: Diagnosis present

## 2022-01-05 DIAGNOSIS — I10 Essential (primary) hypertension: Secondary | ICD-10-CM | POA: Diagnosis present

## 2022-01-05 DIAGNOSIS — Z95810 Presence of automatic (implantable) cardiac defibrillator: Secondary | ICD-10-CM | POA: Diagnosis not present

## 2022-01-05 DIAGNOSIS — Z8616 Personal history of COVID-19: Secondary | ICD-10-CM

## 2022-01-05 DIAGNOSIS — I5022 Chronic systolic (congestive) heart failure: Secondary | ICD-10-CM | POA: Diagnosis present

## 2022-01-05 DIAGNOSIS — K219 Gastro-esophageal reflux disease without esophagitis: Secondary | ICD-10-CM | POA: Diagnosis present

## 2022-01-05 DIAGNOSIS — Z833 Family history of diabetes mellitus: Secondary | ICD-10-CM

## 2022-01-05 DIAGNOSIS — I6523 Occlusion and stenosis of bilateral carotid arteries: Secondary | ICD-10-CM | POA: Diagnosis not present

## 2022-01-05 DIAGNOSIS — I472 Ventricular tachycardia, unspecified: Secondary | ICD-10-CM | POA: Diagnosis present

## 2022-01-05 DIAGNOSIS — R531 Weakness: Secondary | ICD-10-CM | POA: Diagnosis not present

## 2022-01-05 DIAGNOSIS — I152 Hypertension secondary to endocrine disorders: Secondary | ICD-10-CM | POA: Diagnosis present

## 2022-01-05 DIAGNOSIS — J3489 Other specified disorders of nose and nasal sinuses: Secondary | ICD-10-CM | POA: Diagnosis not present

## 2022-01-05 DIAGNOSIS — I672 Cerebral atherosclerosis: Secondary | ICD-10-CM | POA: Diagnosis not present

## 2022-01-05 DIAGNOSIS — C61 Malignant neoplasm of prostate: Secondary | ICD-10-CM | POA: Diagnosis not present

## 2022-01-05 DIAGNOSIS — Z8673 Personal history of transient ischemic attack (TIA), and cerebral infarction without residual deficits: Secondary | ICD-10-CM | POA: Diagnosis present

## 2022-01-05 DIAGNOSIS — Z7982 Long term (current) use of aspirin: Secondary | ICD-10-CM

## 2022-01-05 DIAGNOSIS — E1151 Type 2 diabetes mellitus with diabetic peripheral angiopathy without gangrene: Secondary | ICD-10-CM | POA: Diagnosis not present

## 2022-01-05 DIAGNOSIS — R4781 Slurred speech: Secondary | ICD-10-CM | POA: Diagnosis not present

## 2022-01-05 HISTORY — DX: Transient cerebral ischemic attack, unspecified: G45.9

## 2022-01-05 LAB — CBC
HCT: 38.8 % — ABNORMAL LOW (ref 39.0–52.0)
Hemoglobin: 12 g/dL — ABNORMAL LOW (ref 13.0–17.0)
MCH: 22.9 pg — ABNORMAL LOW (ref 26.0–34.0)
MCHC: 30.9 g/dL (ref 30.0–36.0)
MCV: 74 fL — ABNORMAL LOW (ref 80.0–100.0)
Platelets: 226 10*3/uL (ref 150–400)
RBC: 5.24 MIL/uL (ref 4.22–5.81)
RDW: 16 % — ABNORMAL HIGH (ref 11.5–15.5)
WBC: 7.1 10*3/uL (ref 4.0–10.5)
nRBC: 0 % (ref 0.0–0.2)

## 2022-01-05 LAB — RESP PANEL BY RT-PCR (FLU A&B, COVID) ARPGX2
Influenza A by PCR: NEGATIVE
Influenza B by PCR: NEGATIVE
SARS Coronavirus 2 by RT PCR: NEGATIVE

## 2022-01-05 LAB — DIFFERENTIAL
Abs Immature Granulocytes: 0.01 10*3/uL (ref 0.00–0.07)
Basophils Absolute: 0 10*3/uL (ref 0.0–0.1)
Basophils Relative: 0 %
Eosinophils Absolute: 0.1 10*3/uL (ref 0.0–0.5)
Eosinophils Relative: 2 %
Immature Granulocytes: 0 %
Lymphocytes Relative: 31 %
Lymphs Abs: 2.2 10*3/uL (ref 0.7–4.0)
Monocytes Absolute: 0.7 10*3/uL (ref 0.1–1.0)
Monocytes Relative: 10 %
Neutro Abs: 4 10*3/uL (ref 1.7–7.7)
Neutrophils Relative %: 57 %

## 2022-01-05 LAB — COMPREHENSIVE METABOLIC PANEL
ALT: 20 U/L (ref 0–44)
AST: 19 U/L (ref 15–41)
Albumin: 4.2 g/dL (ref 3.5–5.0)
Alkaline Phosphatase: 115 U/L (ref 38–126)
Anion gap: 4 — ABNORMAL LOW (ref 5–15)
BUN: 11 mg/dL (ref 8–23)
CO2: 28 mmol/L (ref 22–32)
Calcium: 9 mg/dL (ref 8.9–10.3)
Chloride: 107 mmol/L (ref 98–111)
Creatinine, Ser: 0.93 mg/dL (ref 0.61–1.24)
GFR, Estimated: >60 mL/min (ref >60)
Glucose, Bld: 102 mg/dL — ABNORMAL HIGH (ref 70–99)
Potassium: 4.2 mmol/L (ref 3.5–5.1)
Sodium: 139 mmol/L (ref 135–145)
Total Bilirubin: 0.7 mg/dL (ref 0.3–1.2)
Total Protein: 7.8 g/dL (ref 6.5–8.1)

## 2022-01-05 LAB — URINALYSIS, ROUTINE W REFLEX MICROSCOPIC
Bilirubin Urine: NEGATIVE
Glucose, UA: NEGATIVE mg/dL
Hgb urine dipstick: NEGATIVE
Ketones, ur: NEGATIVE mg/dL
Leukocytes,Ua: NEGATIVE
Nitrite: NEGATIVE
Protein, ur: NEGATIVE mg/dL
Specific Gravity, Urine: 1.025 (ref 1.005–1.030)
pH: 5.5 (ref 5.0–8.0)

## 2022-01-05 LAB — RAPID URINE DRUG SCREEN, HOSP PERFORMED
Amphetamines: NOT DETECTED
Barbiturates: NOT DETECTED
Benzodiazepines: NOT DETECTED
Cocaine: NOT DETECTED
Opiates: NOT DETECTED
Tetrahydrocannabinol: NOT DETECTED

## 2022-01-05 LAB — PROTIME-INR
INR: 1.1 (ref 0.8–1.2)
Prothrombin Time: 13.9 seconds (ref 11.4–15.2)

## 2022-01-05 LAB — ETHANOL: Alcohol, Ethyl (B): <10 mg/dL (ref <10)

## 2022-01-05 LAB — APTT: aPTT: 30 seconds (ref 24–36)

## 2022-01-05 MED ORDER — ASPIRIN 300 MG RE SUPP: 300.0000 mg | Freq: Every day | RECTAL | Status: DC

## 2022-01-05 MED ORDER — CLOPIDOGREL BISULFATE 75 MG PO TABS
75.0000 mg | ORAL_TABLET | Freq: Every day | ORAL | Status: DC
  Administered 2022-01-05 – 2022-01-07 (×3): 75 mg via ORAL
  Filled 2022-01-05 (×3): qty 1

## 2022-01-05 MED ORDER — ASPIRIN 81 MG PO CHEW
81.0000 mg | CHEWABLE_TABLET | Freq: Every day | ORAL | Status: DC
  Administered 2022-01-05 – 2022-01-07 (×3): 81 mg via ORAL
  Filled 2022-01-05 (×3): qty 1

## 2022-01-05 MED ORDER — IOHEXOL 350 MG/ML SOLN
80.0000 mL | Freq: Once | INTRAVENOUS | Status: AC | PRN
Start: 2022-01-05 — End: 2022-01-05
  Administered 2022-01-05: 80 mL via INTRAVENOUS

## 2022-01-05 NOTE — Progress Notes (Signed)
Plan of Care Note for accepted transfer   Patient: [name] MRN: [id number]   [place]: 01/05/2022  Facility requesting transfer: [facility] ED Requesting Provider: Dr. Sherry Ruffing Reason for transfer: TIA Facility course:   71 year old male with PMHx nonischemic cardiomyopathy, ventricular tachycardia status post AICD placement, hypertension, coronary artery disease, remote history of prostate cancer, diabetes mellitus type 2, obstructive sleep apnea, restless leg syndrome who presented to [place] emergency department after experiencing episode of aphasia and right-sided weakness.    Patient reports that patient began to experience difficulty speaking and right upper extremity weakness at approximately 5 PM and symptoms seem to have spontaneously resolved 15 minutes or so later.   Patient presented to [facility] for evaluation where EDP discussed case with Dr. Quinn Axe with neurology.  Dr. Quinn Axe recommending hospitalization to [facility] for complete TIA work-up.  Initial CT imaging negative for obvious stroke.   Plan of care: The patient is accepted for admission to Telemetry unit, at [facility]..    Author: Vernelle Emerald, MD 01/05/2022  Check [email or web address] for on-call coverage.  Nursing staff, Please call [place] number on Amion as soon as patient's arrival, so appropriate admitting provider can evaluate the pt.

## 2022-01-05 NOTE — ED Triage Notes (Signed)
Around 5 pm today, pts right arm became heavy, tongue became heavy, slurred speech, and difficulty thinking. Pt states sx have completely resolved. NIH 0. Denies blood thinners, drug/etoh use.

## 2022-01-05 NOTE — ED Provider Notes (Signed)
[facility] EMERGENCY DEPARTMENT Provider Note   CSN: [id number] Arrival date & time: 01/05/22  1742     History  Chief Complaint  Patient presents with   Stroke Symptoms    [name] is a 71 y.o. male.  The history is provided by the patient and medical records. No language interpreter was used.  Neurologic Problem This is a new problem. The current episode started less than 1 hour ago. The problem has been resolved. Associated symptoms include headaches (possible mild HA before). Pertinent negatives include no chest pain, no abdominal pain and no shortness of breath. Nothing aggravates the symptoms. Nothing relieves the symptoms. He has tried nothing for the symptoms. The treatment provided no relief.       Home Medications Prior to Admission medications   Medication Sig Start Date End Date Taking? Authorizing Provider  ACCU-CHEK GUIDE test strip USE TO CHECK SUGAR 4 TIMES A DAY 06/09/21   Binnie Rail, MD  aspirin (ASPIRIN LOW DOSE) 81 MG EC tablet Take 1 tablet (81 mg total) by mouth daily. Swallow whole. 09/19/21   Binnie Rail, MD  atorvastatin (LIPITOR) 20 MG tablet TAKE 1 TABLET BY MOUTH DAILY EXCEPT 1/2 TABLET ON TUESDAY, THURSDAY AND SATURDAY. 07/28/21   Binnie Rail, MD  carvedilol (COREG) 25 MG tablet TAKE 1 TABLET TWICE DAILY WITH MEALS 12/01/21   Evans Lance, MD  digoxin (LANOXIN) 0.125 MG tablet Take 0.5 tablets (0.0625 mg total) by mouth daily. 12/12/21   Shirley Friar, PA-C  docusate sodium (COLACE) 100 MG capsule Take 1 capsule (100 mg total) by mouth 2 (two) times daily as needed for mild constipation. 05/30/21   Burns, Claudina Lick, MD  donepezil (ARICEPT) 10 MG tablet TAKE HALF TABLET (5 MG) DAILY FOR 2 WEEKS, THEN INCREASE TO THE FULL TABLET AT 10 MG DAILY 12/03/21   Shawn Route, Coralee Pesa, PA-C  famotidine (PEPCID) 20 MG tablet Take 1 tablet (20 mg total) by mouth daily. 09/19/21   Binnie Rail, MD  gabapentin (NEURONTIN) 100 MG capsule Take 1  capsule (100 mg total) by mouth 2 (two) times daily. 11/20/21   Rigoberto Noel, MD  Lancets [facility] ULTRASOFT) lancets Use as instructed to test sugar daily and prn. Dx E11.9 01/16/17   Binnie Rail, MD  losartan (COZAAR) 25 MG tablet Take 1 tablet (25 mg total) by mouth at bedtime. 01/01/22   Baldwin Jamaica, PA-C  omeprazole (PRILOSEC) 20 MG capsule TAKE 1 CAPSULE DAILY 30 MINUTES BEFORE BREAKFAST 12/01/21   Binnie Rail, MD  sitaGLIPtin-metformin (JANUMET) 50-1000 MG tablet Take 1 tablet by mouth daily. Take with a meal 05/14/21   Binnie Rail, MD  sotalol (BETAPACE) 120 MG tablet TAKE 1 TABLET TWICE DAILY 11/10/21   Binnie Rail, MD  traMADol (ULTRAM) 50 MG tablet Take 1 tablet (50 mg total) by mouth every 6 (six) hours as needed. 11/20/21   Persons, Bevely Palmer, PA  vitamin B-12 (CYANOCOBALAMIN) 1000 MCG tablet TAKE 1 TABLET EVERY DAY 12/01/21   Binnie Rail, MD      Allergies    Zantac [ranitidine hcl]    Review of Systems   Review of Systems  Constitutional:  Negative for chills, fatigue and fever.  HENT:  Negative for congestion.   Eyes:  Negative for photophobia, pain and visual disturbance.  Respiratory:  Negative for chest tightness and shortness of breath.   Cardiovascular:  Negative for chest pain.  Gastrointestinal:  Negative for abdominal pain, constipation, diarrhea, nausea and vomiting.  Genitourinary:  Negative for dysuria.  Musculoskeletal:  Negative for back pain, neck pain and neck stiffness.  Skin:  Negative for rash.  Neurological:  Positive for speech difficulty, weakness and headaches (possible mild HA before). Negative for dizziness, facial asymmetry, light-headedness and numbness.  Psychiatric/Behavioral:  Negative for agitation and confusion.   All other systems reviewed and are negative.   Physical Exam Updated Vital Signs BP (!) 167/92 (BP Location: Left Arm)   Pulse 68   Temp 97.8 F (36.6 C)   Resp 20   Ht '5\' 11"'$  (1.803 m)   Wt 89.8 kg   SpO2 99%    BMI 27.62 kg/m  Physical Exam Vitals and nursing note reviewed.  Constitutional:      General: He is not in acute distress.    Appearance: He is well-developed. He is not ill-appearing, toxic-appearing or diaphoretic.  HENT:     Head: Normocephalic and atraumatic.     Nose: Nose normal. No congestion or rhinorrhea.     Mouth/Throat:     Mouth: Mucous membranes are moist.     Pharynx: No oropharyngeal exudate.  Eyes:     Extraocular Movements: Extraocular movements intact.     Conjunctiva/sclera: Conjunctivae normal.     Pupils: Pupils are equal, round, and reactive to light.  Neck:     Vascular: No carotid bruit.  Cardiovascular:     Rate and Rhythm: Normal rate and regular rhythm.     Heart sounds: No murmur heard. Pulmonary:     Effort: Pulmonary effort is normal. No respiratory distress.     Breath sounds: Normal breath sounds. No stridor. No wheezing, rhonchi or rales.  Chest:     Chest wall: No tenderness.  Abdominal:     General: Abdomen is flat.     Palpations: Abdomen is soft.     Tenderness: There is no abdominal tenderness. There is no guarding or rebound.  Musculoskeletal:        General: No swelling or tenderness.     Cervical back: Neck supple. No tenderness.     Right lower leg: No edema.     Left lower leg: No edema.  Skin:    General: Skin is warm and dry.     Capillary Refill: Capillary refill takes less than 2 seconds.     Findings: No erythema or rash.  Neurological:     General: No focal deficit present.     Mental Status: He is alert. Mental status is at baseline.     Cranial Nerves: No cranial nerve deficit.     Sensory: No sensory deficit.     Motor: No weakness.  Psychiatric:        Mood and Affect: Mood normal.     ED Results / Procedures / Treatments   Labs (all labs ordered are listed, but only abnormal results are displayed) Labs Reviewed  CBC - Abnormal; Notable for the following components:      Result Value   Hemoglobin 12.0  (*)    HCT 38.8 (*)    MCV 74.0 (*)    MCH 22.9 (*)    RDW 16.0 (*)    All other components within normal limits  COMPREHENSIVE METABOLIC PANEL - Abnormal; Notable for the following components:   Glucose, Bld 102 (*)    Anion gap 4 (*)    All other components within normal limits  RESP PANEL BY RT-PCR (FLU A&B, COVID)  ARPGX2  ETHANOL  PROTIME-INR  APTT  DIFFERENTIAL  RAPID URINE DRUG SCREEN, HOSP PERFORMED  URINALYSIS, ROUTINE W REFLEX MICROSCOPIC  CBG MONITORING, ED    EKG EKG Interpretation  Date/Time:  Monday January 05 2022 18:07:28 EDT Ventricular Rate:  65 PR Interval:  185 QRS Duration: 87 QT Interval:  395 QTC Calculation: 411 R Axis:   7 Text Interpretation: Sinus rhythm Left ventricular hypertrophy Borderline T abnormalities, inferior leads when compared to prior, similar appearance. NO STEMI Confirmed by Antony Blackbird [phone]) on 01/05/2022 6:12:31 PM  Radiology CT ANGIO HEAD NECK W WO CM  Result Date: 01/05/2022 CLINICAL DATA:  Initial evaluation for neuro deficit, stroke suspected. EXAM: CT ANGIOGRAPHY HEAD AND NECK TECHNIQUE: Multidetector CT imaging of the head and neck was performed using the standard protocol during bolus administration of intravenous contrast. Multiplanar CT image reconstructions and MIPs were obtained to evaluate the vascular anatomy. Carotid stenosis measurements (when applicable) are obtained utilizing NASCET criteria, using the distal internal carotid diameter as the denominator. RADIATION DOSE REDUCTION: This exam was performed according to the departmental dose-optimization program which includes automated exposure control, adjustment of the mA and/or kV according to patient size and/or use of iterative reconstruction technique. CONTRAST:  11m OMNIPAQUE IOHEXOL 350 MG/ML SOLN COMPARISON:  Head CT from earlier the same day. FINDINGS: CTA NECK FINDINGS Aortic arch: Visualized aortic arch normal caliber with normal branch pattern. No stenosis  about the origin the great vessels. Right carotid system: Right common and internal carotid arteries widely patent without stenosis, dissection or occlusion. Mild atheromatous plaque about the right carotid bulb without stenosis. Left carotid system: Left common and internal carotid arteries widely patent without stenosis, dissection or occlusion. Vertebral arteries: Both vertebral arteries arise from the subclavian arteries. Right vertebral artery dominant, with a diffusely hypoplastic left vertebral artery. Vertebral arteries patent without stenosis or dissection. Skeleton: No discrete or worrisome osseous lesions. Mild cervical spondylosis for age. Other neck: 1.6 cm cystic lesion within the subcutaneous fat of the mid upper posterior neck, indeterminate, but likely a sebaceous cyst (series 5, image 77). Asymmetric fatty atrophy of the left parotid gland noted. No other acute soft tissue abnormality within the neck. Upper chest: Left-sided pacemaker/AICD partially visualized. Visualized upper chest demonstrates no acute finding. Review of the MIP images confirms the above findings CTA HEAD FINDINGS Anterior circulation: Petrous segments patent bilaterally. Mild atheromatous change within the carotid siphons without significant stenosis. A1 segments, anterior communicating artery complex common anterior cerebral arteries widely patent. No M1 stenosis or occlusion. No proximal MCA branch occlusion. Distal MCA branches perfused and symmetric. Posterior circulation: Dominant right vertebral artery widely patent. Left vertebral artery largely terminates in PICA. Both PICA patent. Basilar patent to its distal aspect without stenosis. Superior cerebral arteries patent bilaterally. Both PCAs widely patent and well perfused or distal aspects. Venous sinuses: Patent allowing for timing the contrast bolus. Anatomic variants: None significant.  No aneurysm. Review of the MIP images confirms the above findings IMPRESSION: 1.  Negative CTA for large vessel occlusion or other emergent finding. 2. Mild for age atheromatous disease without hemodynamically significant or correctable stenosis. 3. 1.6 cm cystic lesion within the subcutaneous fat of the mid upper posterior neck, indeterminate, but likely a sebaceous cyst. Correlation with physical exam suggested. Electronically Signed   By: BJeannine BogaM.D.   On: 01/05/2022 20:32   CT HEAD WO CONTRAST  Result Date: 01/05/2022 CLINICAL DATA:  Onset of RIGHT arm heaviness, tongue heaviness, slurred speech and difficulty  thinking at 1700 hours today. Symptoms resolved. Question stroke. History prostate cancer, type II diabetes mellitus, MI, non ischemic cardiomyopathy, essential hypertension EXAM: CT HEAD WITHOUT CONTRAST TECHNIQUE: Contiguous axial images were obtained from the base of the skull through the vertex without intravenous contrast. RADIATION DOSE REDUCTION: This exam was performed according to the departmental dose-optimization program which includes automated exposure control, adjustment of the mA and/or kV according to patient size and/or use of iterative reconstruction technique. COMPARISON:  02/17/2021 FINDINGS: Brain: Generalized atrophy. Normal ventricular morphology. No midline shift or mass effect. Small vessel chronic ischemic changes of deep cerebral white matter. No intracranial hemorrhage, mass lesion, evidence of acute infarction, or extra-axial fluid collection. Vascular: No hyperdense vessels. Mild atherosclerotic calcification of internal carotid arteries at skull base. Skull: Intact Sinuses/Orbits: Clear Other: N/A IMPRESSION: Atrophy with small vessel chronic ischemic changes of deep cerebral white matter. No acute intracranial abnormalities. Electronically Signed   By: Lavonia Dana M.D.   On: 01/05/2022 18:45    Procedures Procedures    Medications Ordered in ED Medications  clopidogrel (PLAVIX) tablet 75 mg (75 mg Oral Given 01/05/22 1950)   aspirin chewable tablet 81 mg (81 mg Oral Given 01/05/22 1950)    Or  aspirin suppository 300 mg ( Rectal See Alternative 01/05/22 1950)  iohexol (OMNIPAQUE) 350 MG/ML injection 80 mL (80 mLs Intravenous Contrast Given 01/05/22 1924)    ED Course/ Medical Decision Making/ A&P                           Medical Decision Making Amount and/or Complexity of Data Reviewed Labs: ordered. Radiology: ordered.  Risk Prescription drug management. Decision regarding hospitalization.    [name] is a 71 y.o. male with a past medical history significant for CHF, ventricular tachycardia with AICD, aortic atherosclerosis, diabetes, hypertension, Barrett's esophagus, previous prostate cancer, and anxiety who presents with neurologic complaint.  According to patient and family, at approximately 5 PM this evening he was in a conversation with his wife when he suddenly stopped being able to speak normally.  He reports that he was unable to get anyone to say out, his word sounded slurred, his tongue felt heavy, and his right arm felt very heavy and weak.  He has never had this before.  He reports it lasted approximately 15 minutes prior to improving and they came in for evaluation.  He denied any vision changes, leg symptoms, and reports he is at his baseline now.  Denies any neck pain, chest pain, palpitations, or abdominal pain.  He personally does not report headache but his wife says that she thought he told her that he had a mild headache earlier today.  He denies any recent trauma, fevers, chills, urinary changes or GI changes.  On exam, lungs clear and chest nontender.  Abdomen nontender.  Patient moving all extremities.  Intact sensation, strength, and pulses.  Normal finger-nose-finger testing.  Pupils are symmetric and reactive with normal extraocular movements.  Symmetric smile.  No carotid bruit appreciated on my exam.  Exam otherwise unremarkable.  Due to his symptoms completely resolving,  suspect this is a TIA.  Patient will have initial work-up and will talk to neurology to help guide management.  6:40 PM Just spoke to Dr. Quinn Axe with neurology.  She recommended the patient get CTA head and neck as well as the noncontrasted scan he had earlier.  She recommends he get admitted to medicine at [facility] to  then be followed by neurology for suspected TIA.  She ordered aspirin and Plavix for him and after the CTs are completed, will call for admission.  She would like him to be monitored here closely until at least 9:30 PM when he then would leave the TNK window.  He will be admitted after work-up is completed.  CTA showed no LVO or occlusion.  We will call for admission for further management of suspected TIA.  Patient has not had any recurrent symptoms at this time.         Final Clinical Impression(s) / ED Diagnoses Final diagnoses:  Transient speech disturbance  Right arm weakness      Clinical Impression: 1. Transient speech disturbance   2. Right arm weakness     Disposition: Admit  This note was prepared with assistance of Dragon voice recognition software. Occasional wrong-word or sound-a-like substitutions may have occurred due to the inherent limitations of voice recognition software.     Abimael Zeiter, Gwenyth Allegra, MD 01/05/22 2350

## 2022-01-05 NOTE — Plan of Care (Signed)
Neurology plan of care  71 yo man with hx AICD for vtach, chronic systolic HF 2/2 NICM, HTN, prior MI, prostate cancer (2016, in remission), mild vascular neurocognitive disorder, DM2, OSA, RLS presented to [facility] after transient episode of aphasia and R sided weakness at 1700 today, now completely resolved. Sx highly concerning for TIA/stroke given localization to L MCA territory and numerous cerebrovascular risk factors. Head CT NAICP, possible calcified atherosclerosis L MCA (personal review). I gave the following recommendations by phone to Dr. Sherry Ruffing:  - Observe patient in [facility] ED w/ vital signs q 30 min and NIHSS q 30 min until he is outside the TNK window at 9:30pm. If patient has recurrent sx, STAT activate telestroke - After patient is outside of the TNK window, please admit to [facility] hospitalist service for stroke/TIA workup. Page neurology upon patient arrival to [facility]. - CTA H&N in [facility] ED, page me if sig abnl - Permissive HTN x48 hrs from sx onset or until stroke ruled out by MRI goal BP <220/110. PRN labetalol or hydralazine if BP above these parameters. Avoid oral antihypertensives. - MRI brain wo contrast at [facility] if AICD is MRI-compatible, if it is not repeat head CT at 24 hrs - Interrogate AICD for e/o a fib - TTE  - Check A1c and LDL + add statin per guidelines - ASA '81mg'$  daily + plavix '75mg'$  daily x21 days f/b plavix 75 mg daily monotherapy after that - q4 hr neuro checks - STAT head CT for any change in neuro exam - Tele - PT/OT/SLP - Stroke education - Amb referral to neurology upon discharge   Page neurology upon patient arrival to [facility].  Su Monks, MD [facility] [phone]  If 7pm- 7am, please page neurology on call as listed in [place].

## 2022-01-06 ENCOUNTER — Ambulatory Visit: Payer: Medicare PPO | Admitting: Orthopaedic Surgery

## 2022-01-06 ENCOUNTER — Other Ambulatory Visit: Payer: Self-pay

## 2022-01-06 ENCOUNTER — Encounter (HOSPITAL_COMMUNITY): Payer: Self-pay | Admitting: Internal Medicine

## 2022-01-06 ENCOUNTER — Other Ambulatory Visit: Payer: Self-pay | Admitting: Internal Medicine

## 2022-01-06 ENCOUNTER — Observation Stay (HOSPITAL_COMMUNITY): Payer: Medicare PPO

## 2022-01-06 ENCOUNTER — Inpatient Hospital Stay (HOSPITAL_COMMUNITY): Payer: Medicare PPO

## 2022-01-06 ENCOUNTER — Other Ambulatory Visit: Payer: Self-pay | Admitting: Physician Assistant

## 2022-01-06 DIAGNOSIS — I4891 Unspecified atrial fibrillation: Secondary | ICD-10-CM

## 2022-01-06 DIAGNOSIS — Z820 Family history of epilepsy and other diseases of the nervous system: Secondary | ICD-10-CM | POA: Diagnosis not present

## 2022-01-06 DIAGNOSIS — E119 Type 2 diabetes mellitus without complications: Secondary | ICD-10-CM | POA: Diagnosis present

## 2022-01-06 DIAGNOSIS — I472 Ventricular tachycardia, unspecified: Secondary | ICD-10-CM | POA: Diagnosis present

## 2022-01-06 DIAGNOSIS — Z888 Allergy status to other drugs, medicaments and biological substances status: Secondary | ICD-10-CM | POA: Diagnosis not present

## 2022-01-06 DIAGNOSIS — Z8249 Family history of ischemic heart disease and other diseases of the circulatory system: Secondary | ICD-10-CM | POA: Diagnosis not present

## 2022-01-06 DIAGNOSIS — G459 Transient cerebral ischemic attack, unspecified: Secondary | ICD-10-CM | POA: Diagnosis present

## 2022-01-06 DIAGNOSIS — Z20822 Contact with and (suspected) exposure to covid-19: Secondary | ICD-10-CM | POA: Diagnosis present

## 2022-01-06 DIAGNOSIS — K219 Gastro-esophageal reflux disease without esophagitis: Secondary | ICD-10-CM | POA: Diagnosis present

## 2022-01-06 DIAGNOSIS — F05 Delirium due to known physiological condition: Secondary | ICD-10-CM | POA: Diagnosis present

## 2022-01-06 DIAGNOSIS — Z95810 Presence of automatic (implantable) cardiac defibrillator: Secondary | ICD-10-CM | POA: Diagnosis not present

## 2022-01-06 DIAGNOSIS — Z923 Personal history of irradiation: Secondary | ICD-10-CM | POA: Diagnosis not present

## 2022-01-06 DIAGNOSIS — G4733 Obstructive sleep apnea (adult) (pediatric): Secondary | ICD-10-CM | POA: Diagnosis present

## 2022-01-06 DIAGNOSIS — I5022 Chronic systolic (congestive) heart failure: Secondary | ICD-10-CM | POA: Diagnosis present

## 2022-01-06 DIAGNOSIS — I10 Essential (primary) hypertension: Secondary | ICD-10-CM

## 2022-01-06 DIAGNOSIS — Z8616 Personal history of COVID-19: Secondary | ICD-10-CM | POA: Diagnosis not present

## 2022-01-06 DIAGNOSIS — I252 Old myocardial infarction: Secondary | ICD-10-CM | POA: Diagnosis not present

## 2022-01-06 DIAGNOSIS — E1151 Type 2 diabetes mellitus with diabetic peripheral angiopathy without gangrene: Secondary | ICD-10-CM

## 2022-01-06 DIAGNOSIS — I4729 Other ventricular tachycardia: Secondary | ICD-10-CM

## 2022-01-06 DIAGNOSIS — I428 Other cardiomyopathies: Secondary | ICD-10-CM

## 2022-01-06 DIAGNOSIS — I110 Hypertensive heart disease with heart failure: Secondary | ICD-10-CM | POA: Diagnosis present

## 2022-01-06 DIAGNOSIS — E785 Hyperlipidemia, unspecified: Secondary | ICD-10-CM | POA: Diagnosis present

## 2022-01-06 DIAGNOSIS — C61 Malignant neoplasm of prostate: Secondary | ICD-10-CM

## 2022-01-06 DIAGNOSIS — R55 Syncope and collapse: Secondary | ICD-10-CM

## 2022-01-06 DIAGNOSIS — Z8042 Family history of malignant neoplasm of prostate: Secondary | ICD-10-CM | POA: Diagnosis not present

## 2022-01-06 DIAGNOSIS — Z8546 Personal history of malignant neoplasm of prostate: Secondary | ICD-10-CM | POA: Diagnosis not present

## 2022-01-06 DIAGNOSIS — G3183 Dementia with Lewy bodies: Secondary | ICD-10-CM | POA: Diagnosis present

## 2022-01-06 DIAGNOSIS — D649 Anemia, unspecified: Secondary | ICD-10-CM | POA: Diagnosis present

## 2022-01-06 DIAGNOSIS — F0280 Dementia in other diseases classified elsewhere without behavioral disturbance: Secondary | ICD-10-CM | POA: Diagnosis present

## 2022-01-06 DIAGNOSIS — I480 Paroxysmal atrial fibrillation: Secondary | ICD-10-CM | POA: Diagnosis present

## 2022-01-06 LAB — COMPREHENSIVE METABOLIC PANEL
ALT: 20 U/L (ref 0–44)
AST: 16 U/L (ref 15–41)
Albumin: 3.8 g/dL (ref 3.5–5.0)
Alkaline Phosphatase: 118 U/L (ref 38–126)
Anion gap: 7 (ref 5–15)
BUN: 8 mg/dL (ref 8–23)
CO2: 28 mmol/L (ref 22–32)
Calcium: 9.4 mg/dL (ref 8.9–10.3)
Chloride: 105 mmol/L (ref 98–111)
Creatinine, Ser: 0.87 mg/dL (ref 0.61–1.24)
GFR, Estimated: >60 mL/min (ref >60)
Glucose, Bld: 93 mg/dL (ref 70–99)
Potassium: 3.5 mmol/L (ref 3.5–5.1)
Sodium: 140 mmol/L (ref 135–145)
Total Bilirubin: 0.9 mg/dL (ref 0.3–1.2)
Total Protein: 7.2 g/dL (ref 6.5–8.1)

## 2022-01-06 LAB — ECHOCARDIOGRAM COMPLETE
Area-P 1/2: 3.48 cm2
Calc EF: 38.3 %
Height: 71 in
S' Lateral: 4.6 cm
Single Plane A2C EF: 36.2 %
Single Plane A4C EF: 40.4 %
Weight: 3168 oz

## 2022-01-06 LAB — GLUCOSE, CAPILLARY
Glucose-Capillary: 181 mg/dL — ABNORMAL HIGH (ref 70–99)
Glucose-Capillary: 123 mg/dL — ABNORMAL HIGH (ref 70–99)
Glucose-Capillary: 157 mg/dL — ABNORMAL HIGH (ref 70–99)

## 2022-01-06 LAB — CBC
HCT: 38.2 % — ABNORMAL LOW (ref 39.0–52.0)
Hemoglobin: 12.3 g/dL — ABNORMAL LOW (ref 13.0–17.0)
MCH: 23.4 pg — ABNORMAL LOW (ref 26.0–34.0)
MCHC: 32.2 g/dL (ref 30.0–36.0)
MCV: 72.6 fL — ABNORMAL LOW (ref 80.0–100.0)
Platelets: 218 10*3/uL (ref 150–400)
RBC: 5.26 MIL/uL (ref 4.22–5.81)
RDW: 15.6 % — ABNORMAL HIGH (ref 11.5–15.5)
WBC: 5.8 10*3/uL (ref 4.0–10.5)
nRBC: 0 % (ref 0.0–0.2)

## 2022-01-06 LAB — LIPID PANEL
Cholesterol: 109 mg/dL (ref 0–200)
HDL: 37 mg/dL — ABNORMAL LOW (ref >40)
LDL Cholesterol: 64 mg/dL (ref 0–99)
Total CHOL/HDL Ratio: 2.9 RATIO
Triglycerides: 42 mg/dL (ref <150)
VLDL: 8 mg/dL (ref 0–40)

## 2022-01-06 LAB — HEMOGLOBIN A1C
Hgb A1c MFr Bld: 6.2 % — ABNORMAL HIGH (ref 4.8–5.6)
Mean Plasma Glucose: 131.24 mg/dL

## 2022-01-06 LAB — DIGOXIN LEVEL: Digoxin Level: 0.2 ng/mL — ABNORMAL LOW (ref 0.8–2.0)

## 2022-01-06 LAB — HIV ANTIBODY (ROUTINE TESTING W REFLEX): HIV Screen 4th Generation wRfx: NONREACTIVE

## 2022-01-06 MED ORDER — ENOXAPARIN SODIUM 40 MG/0.4ML IJ SOSY
40.0000 mg | PREFILLED_SYRINGE | INTRAMUSCULAR | Status: DC
  Administered 2022-01-06 – 2022-01-07 (×2): 40 mg via SUBCUTANEOUS
  Filled 2022-01-06 (×2): qty 0.4

## 2022-01-06 MED ORDER — STROKE: EARLY STAGES OF RECOVERY BOOK
Freq: Once | Status: AC
Start: 2022-01-07 — End: 2022-01-07
  Filled 2022-01-06: qty 1

## 2022-01-06 MED ORDER — ACETAMINOPHEN 650 MG RE SUPP: 650.0000 mg | RECTAL | Status: DC | PRN

## 2022-01-06 MED ORDER — SOTALOL HCL 120 MG PO TABS
120.0000 mg | ORAL_TABLET | Freq: Two times a day (BID) | ORAL | Status: DC
  Administered 2022-01-06 – 2022-01-07 (×3): 120 mg via ORAL
  Filled 2022-01-06 (×4): qty 1

## 2022-01-06 MED ORDER — INSULIN ASPART 100 UNIT/ML IJ SOLN
0.0000 [IU] | Freq: Three times a day (TID) | INTRAMUSCULAR | Status: DC
  Administered 2022-01-06 – 2022-01-07 (×2): 2 [IU] via SUBCUTANEOUS

## 2022-01-06 MED ORDER — MELATONIN 5 MG PO TABS
10.0000 mg | ORAL_TABLET | Freq: Once | ORAL | Status: AC
  Administered 2022-01-06: 10 mg via ORAL
  Filled 2022-01-06: qty 2

## 2022-01-06 MED ORDER — DIGOXIN 125 MCG PO TABS
0.0625 mg | ORAL_TABLET | Freq: Every day | ORAL | Status: DC
  Administered 2022-01-06 – 2022-01-07 (×2): 0.0625 mg via ORAL
  Filled 2022-01-06 (×2): qty 1

## 2022-01-06 MED ORDER — ATORVASTATIN CALCIUM 10 MG PO TABS
20.0000 mg | ORAL_TABLET | ORAL | Status: DC
  Administered 2022-01-07: 20 mg via ORAL
  Filled 2022-01-06: qty 2

## 2022-01-06 MED ORDER — VITAMIN B-12 1000 MCG PO TABS
1000.0000 ug | ORAL_TABLET | Freq: Every day | ORAL | Status: DC
  Administered 2022-01-06 – 2022-01-07 (×2): 1000 ug via ORAL
  Filled 2022-01-06 (×2): qty 1

## 2022-01-06 MED ORDER — ACETAMINOPHEN 325 MG PO TABS: 650.0000 mg | ORAL_TABLET | ORAL | Status: DC | PRN

## 2022-01-06 MED ORDER — HYDRALAZINE HCL 20 MG/ML IJ SOLN: 10.0000 mg | INTRAMUSCULAR | Status: DC | PRN

## 2022-01-06 MED ORDER — GABAPENTIN 100 MG PO CAPS
100.0000 mg | ORAL_CAPSULE | Freq: Two times a day (BID) | ORAL | Status: DC
  Administered 2022-01-06 – 2022-01-07 (×4): 100 mg via ORAL
  Filled 2022-01-06 (×4): qty 1

## 2022-01-06 MED ORDER — ATORVASTATIN CALCIUM 10 MG PO TABS
10.0000 mg | ORAL_TABLET | ORAL | Status: DC
  Administered 2022-01-06: 10 mg via ORAL
  Filled 2022-01-06: qty 1

## 2022-01-06 MED ORDER — ATORVASTATIN CALCIUM 10 MG PO TABS: 20.0000 mg | ORAL_TABLET | Freq: Every day | ORAL | Status: DC

## 2022-01-06 MED ORDER — ACETAMINOPHEN 160 MG/5ML PO SOLN: 650.0000 mg | ORAL | Status: DC | PRN

## 2022-01-06 NOTE — Progress Notes (Signed)
HOSPITAL MEDICINE OVERNIGHT EVENT NOTE    Nursing reports the patient has becoming increasingly agitated throughout the evening shift.  Patient has a known history of cognitive deficit/mild dementia.  Since patient is still redirectable and family remains at the bedside.  Will try a trial dose of melatonin in an effort to help the patient rest through the evening.  If this fails and patient continues to worsen may have to rely on an agent such as Haldol.  Vernelle Emerald  MD [facility]
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
## 2022-01-06 NOTE — Progress Notes (Signed)
   01/06/22 2330  Provider Notification  Provider Name/Title Dr Cyd Silence  Date Provider Notified 01/06/22  Time Provider Notified 2330  Method of Notification Page  Notification Reason Other (Comment) (Pt getting confused and agitated)  Provider response See new orders  Date of Provider Response 01/06/22  Time of Provider Response 2340

## 2022-01-06 NOTE — Consult Note (Signed)
NEUROLOGY CONSULTATION NOTE   Date of service: January 06, 2022 Patient Name: [name] MRN:  [id number] DOB:  1951/03/25 Reason for consult: "Episode of aphasia and R sided weakness" Requesting Provider: Vernelle Emerald, MD _ _ _   _ __   _ __ _ _  __ __   _ __   __ _  History of Present Illness  [name] is a 71 y.o. male with PMH significant for anxiety, arthritis, GERD, HTN, HLD, DM2, NICM, chronic CHF (systolic), AICD, GERD, prostrate cancer, MCI who presents with sudden onset aphasia and R sided weakness. The episode self resolved after 15 mins.  Was talking to his wife at 15 on 01/05/22 when he had sudden onset garbled speech, tongue felt twisted. Unable to find word or figure out structure of a sentence. At the same time, also noted RUE and RLE numbness and heaviness. This lasted about 15 mins and then resolved.  No prior hx of strokes, no family hx of strokes, no smoking, no EtOh use, no recreational substances.  LKW: 8119 on 01/05/22. mRS: 1 tNKASE: not offered, resolution of symptoms. Thrombectomy: not offered, resolution of symptoms. NIHSS components Score: Comment  1a Level of Conscious 0'[x]'$  1'[]'$  2'[]'$  3'[]'$      1b LOC Questions 0'[]'$  1'[]'$  2'[x]'$       1c LOC Commands 0'[x]'$  1'[]'$  2'[]'$       2 Best Gaze 0'[x]'$  1'[]'$  2'[]'$       3 Visual 0'[x]'$  1'[]'$  2'[]'$  3'[]'$      4 Facial Palsy 0'[x]'$  1'[]'$  2'[]'$  3'[]'$      5a Motor Arm - left 0'[x]'$  1'[]'$  2'[]'$  3'[]'$  4'[]'$  UN'[]'$    5b Motor Arm - Right 0'[x]'$  1'[]'$  2'[]'$  3'[]'$  4'[]'$  UN'[]'$    6a Motor Leg - Left 0'[x]'$  1'[]'$  2'[]'$  3'[]'$  4'[]'$  UN'[]'$    6b Motor Leg - Right 0'[x]'$  1'[]'$  2'[]'$  3'[]'$  4'[]'$  UN'[]'$    7 Limb Ataxia 0'[x]'$  1'[]'$  2'[]'$  3'[]'$  UN'[]'$     8 Sensory 0'[x]'$  1'[]'$  2'[]'$  UN'[]'$      9 Best Language 0'[x]'$  1'[]'$  2'[]'$  3'[]'$      10 Dysarthria 0'[x]'$  1'[]'$  2'[]'$  UN'[]'$      11 Extinct. and Inattention 0'[x]'$  1'[]'$  2'[]'$       TOTAL: 2      ROS   Constitutional Denies weight loss, fever and chills.   HEENT Denies changes in vision and hearing.   Respiratory Denies SOB and cough.   CV Denies palpitations and CP   GI Denies  abdominal pain, nausea, vomiting and diarrhea.   GU Denies dysuria and urinary frequency.   MSK Denies myalgia and joint pain.   Skin Denies rash and pruritus.   Neurological Denies headache and syncope.   Psychiatric Denies recent changes in mood. Denies anxiety and depression.    Past History   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   Not compatible with MRI    Barrett's esophagus 09/05/2004   2 cm changes seen at index EGD   Cataract    Chronic back pain 14/78/2956   Chronic systolic heart failure    Colon polyps    tubular adenoma   Dysrhythmia    Essential hypertension 08/26/2009   Fatigue 12/02/2020   Generalized anxiety disorder    since defibrillator placement   GERD (gastroesophageal reflux disease)    Hip pain 12/14/2016   History of COVID-19    History of myocardial infarction  Hyperlipidemia    Left knee pain 12/14/2016   Malignant neoplasm of prostate 09/27/2014   s/p prostatectomy   Mild anemia 01/20/2011   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 b. s/p Medtronic ICD.   Obstructive sleep apnea 11/10/2012   no CPAP use   Right foot pain 12/15/2017   RLS (restless legs syndrome) 11/29/2012   S/P radiation therapy    10/10/2014 through 11/26/2014; Prostate bed 6600 cGy in 33 sessions   Shingles 07/28/2016   Type II diabetes mellitus 08/26/2009   Ventricular tachycardia ([place])    a. s/p ICD (generator change 09/2012). b. 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 and placed on sotalol   Vitamin B 12 deficiency 03/06/2011   Monthly B12 shots initiated  2011. Nasally  inhaled B12 as of 07/2012   Past Surgical History:  Procedure Laterality Date   CARDIAC DEFIBRILLATOR PLACEMENT  03/11/2004   Medtronic Maximo single lead   COLONOSCOPY W/ BIOPSIES AND  POLYPECTOMY  08/2004, 02/24/11   91m adenoma in 2006,  5 mm polyp (not recovered) 2012   [place]A 10/08/2015   Procedure: CYSTOSCOPY WITH DIRECT VISION INTERNAL URETHROTOMY;  Surgeon: BNickie Retort MD;  Location: [facility];  Service: Urology;  Laterality: N/A;   IMPLANTABLE CARDIOVERTER DEFIBRILLATOR GENERATOR CHANGE N/A 10/07/2012   Procedure: IMPLANTABLE CARDIOVERTER DEFIBRILLATOR GENERATOR CHANGE;  Surgeon: GEvans Lance MD;  [facility];  Service: Cardiovascular;  Laterality: N/A;   PROSTATECTOMY  06/2003   Dr NJanice Norrie  RECTAL SURGERY     UPPER GASTROINTESTINAL ENDOSCOPY  08/2004, 02/24/11   Barrett's esophagus   Family History  Problem Relation Age of Onset   Dementia Mother    Hypertension Mother    Coronary artery disease Father    Prostate cancer Father    Diabetes Father    Dementia Sister    Dementia Brother    Diabetes Paternal Grandmother    Dementia Maternal Aunt    Stroke Neg Hx    Colon cancer Neg Hx    Colon polyps Neg Hx    Esophageal cancer Neg Hx    Rectal cancer Neg Hx    Stomach cancer Neg Hx    Social History   Socioeconomic History   Marital status: Married    Spouse name: Not on file   Number of children: 2   Years of education: 16   Highest education level: Bachelor's degree (e.g., BA, AB, BS)  Occupational History   Occupation: BTherapist, sports [employer]  Tobacco Use   Smoking status: Never   Smokeless tobacco: Never  Vaping Use   Vaping Use: Never used  Substance and Sexual Activity   Alcohol use: No   Drug use: No   Sexual activity: Not on file  Other Topics Concern   Not on file  Social History Narrative   Right handed   No caffeine   Two story home   Social Determinants of Health   Financial Resource Strain: Low Risk  (11/21/2021)   Overall Financial Resource Strain (CARDIA)    Difficulty of Paying Living Expenses: Not hard at all  Food Insecurity: No Food  Insecurity (11/21/2021)   Hunger Vital Sign    Worried About Running Out of Food in the Last Year: Never true    Ran Out of Food in the Last Year: Never true  Transportation Needs: No  Transportation Needs (11/21/2021)   PRAPARE - Hydrologist (Medical): No    Lack of Transportation (Non-Medical): No  Physical Activity: Sufficiently Active (11/21/2021)   Exercise Vital Sign    Days of Exercise per Week: 5 days    Minutes of Exercise per Session: 60 min  Stress: No Stress Concern Present (11/21/2021)   [place]    Feeling of Stress : Not at all  Social Connections: [place] (11/21/2021)   Social Connection and Isolation Panel [NHANES]    Frequency of Communication with Friends and Family: More than three times a week    Frequency of Social Gatherings with Friends and Family: More than three times a week    Attends Religious Services: More than 4 times per year    Active Member of [employer] or Organizations: Yes    Attends Music therapist: More than 4 times per year    Marital Status: Married   Allergies  Allergen Reactions   Zantac [Ranitidine Hcl]     itching    Medications   Medications Prior to Admission  Medication Sig Dispense Refill Last Dose   ACCU-CHEK GUIDE test strip USE TO CHECK SUGAR 4 TIMES A DAY 100 strip 4    aspirin (ASPIRIN LOW DOSE) 81 MG EC tablet Take 1 tablet (81 mg total) by mouth daily. Swallow whole. 90 tablet 3    atorvastatin (LIPITOR) 20 MG tablet TAKE 1 TABLET BY MOUTH DAILY EXCEPT 1/2 TABLET ON TUESDAY, THURSDAY AND SATURDAY. 24 tablet 8    carvedilol (COREG) 25 MG tablet TAKE 1 TABLET TWICE DAILY WITH MEALS 180 tablet 1    digoxin (LANOXIN) 0.125 MG tablet Take 0.5 tablets (0.0625 mg total) by mouth daily. 45 tablet 3    docusate sodium (COLACE) 100 MG capsule Take 1 capsule (100 mg total) by mouth 2 (two) times daily as needed for mild  constipation. 60 capsule 2    donepezil (ARICEPT) 10 MG tablet TAKE HALF TABLET (5 MG) DAILY FOR 2 WEEKS, THEN INCREASE TO THE FULL TABLET AT 10 MG DAILY 90 tablet 1    famotidine (PEPCID) 20 MG tablet Take 1 tablet (20 mg total) by mouth daily. 90 tablet 3    gabapentin (NEURONTIN) 100 MG capsule Take 1 capsule (100 mg total) by mouth 2 (two) times daily. 60 capsule 2    Lancets (ONETOUCH ULTRASOFT) lancets Use as instructed to test sugar daily and prn. Dx E11.9 100 each 12    losartan (COZAAR) 25 MG tablet Take 1 tablet (25 mg total) by mouth at bedtime. 90 tablet 2    omeprazole (PRILOSEC) 20 MG capsule TAKE 1 CAPSULE DAILY 30 MINUTES BEFORE BREAKFAST 90 capsule 1    sitaGLIPtin-metformin (JANUMET) 50-1000 MG tablet Take 1 tablet by mouth daily. Take with a meal 30 tablet 8    sotalol (BETAPACE) 120 MG tablet TAKE 1 TABLET TWICE DAILY 120 tablet 2    traMADol (ULTRAM) 50 MG tablet Take 1 tablet (50 mg total) by mouth every 6 (six) hours as needed. 30 tablet 0    vitamin B-12 (CYANOCOBALAMIN) 1000 MCG tablet TAKE 1 TABLET EVERY DAY 90 tablet 1      Vitals   Vitals:   01/05/22 2230 01/05/22 2300 01/05/22 2330 01/06/22 0033  BP: (!) 159/85 (!) 174/100 (!) 167/88 (!) 178/95  Pulse: 77 72 66 68  Resp: '19 18 17 19  '$ Temp:    98.2  F (36.8 C)  TempSrc:    Oral  SpO2: 96% 100% 96% 99%  Weight:      Height:         Body mass index is 27.62 kg/m.  Physical Exam   General: Laying comfortably in bed; in no acute distress.  HENT: Normal oropharynx and mucosa. Normal external appearance of ears and nose.  Neck: Supple, no pain or tenderness CV: No JVD. No peripheral edema.  Pulmonary: Symmetric Chest rise. Normal respiratory effort.  Abdomen: Soft to touch, non-tender.  Ext: No cyanosis, edema, or deformity  Skin: No rash. Normal palpation of skin.   Musculoskeletal: Normal digits and nails by inspection. No clubbing.   Neurologic Examination  Mental status/Cognition: Alert, oriented  to self, but not to place, month and year, good attention.  Speech/language: Fluent, comprehension intact, object naming intact, repetition intact. Cranial nerves:   CN II Pupils equal and reactive to light, no VF deficits    CN III,IV,VI EOM intact, no gaze preference or deviation, no nystagmus    CN V normal sensation in V1, V2, and V3 segments bilaterally    CN VII no asymmetry, no nasolabial fold flattening    CN VIII normal hearing to speech    CN IX & X normal palatal elevation, no uvular deviation    CN XI 5/5 head turn and 5/5 shoulder shrug bilaterally    CN XII midline tongue protrusion    Motor:  Muscle bulk: normal, tone normal, pronator drift none tremor none Mvmt Root Nerve  Muscle Right Left Comments  SA C5/6 Ax Deltoid 5 5   EF C5/6 Mc Biceps 5 5   EE C6/7/8 Rad Triceps 5 5   WF C6/7 Med FCR     WE C7/8 PIN ECU     F Ab C8/T1 U ADM/FDI 5 5   HF L1/2/3 Fem Illopsoas 5 5   KE L2/3/4 Fem Quad 5 5   DF L4/5 D Peron Tib Ant 5 5   PF S1/2 Tibial Grc/Sol 5 5    Reflexes:  Right Left Comments  Pectoralis      Biceps (C5/6) 2 2   Brachioradialis (C5/6) 2 2    Triceps (C6/7) 2 2    Patellar (L3/4) 2 2    Achilles (S1)      [place]man      Plantar     Jaw jerk    Sensation:  Light touch Intact throughout   Pin prick    Temperature    Vibration   Proprioception    Coordination/Complex Motor:  - Finger to Nose intact BL - Heel to shin intact BL - Rapid alternating movement are normal - Gait: deferred for patient safety.  Labs   CBC:  Recent Labs  Lab 01/05/22 1810  WBC 7.1  NEUTROABS 4.0  HGB 12.0*  HCT 38.8*  MCV 74.0*  PLT 338    Basic Metabolic Panel:  Lab Results  Component Value Date   NA 139 01/05/2022   K 4.2 01/05/2022   CO2 28 01/05/2022   GLUCOSE 102 (H) 01/05/2022   BUN 11 01/05/2022   CREATININE 0.93 01/05/2022   CALCIUM 9.0 01/05/2022   GFRNONAA >60 01/05/2022   GFRAA 90 04/13/2019   Lipid Panel:  Lab Results  Component  Value Date   LDLCALC 45 08/04/2021   HgbA1c:  Lab Results  Component Value Date   HGBA1C 6.6 (H) 08/04/2021   Urine Drug Screen:     Component Value Date/Time  LABOPIA NONE DETECTED 01/05/2022 1810   COCAINSCRNUR NONE DETECTED 01/05/2022 1810   LABBENZ NONE DETECTED 01/05/2022 1810   AMPHETMU NONE DETECTED 01/05/2022 1810   THCU NONE DETECTED 01/05/2022 1810   LABBARB NONE DETECTED 01/05/2022 1810    Alcohol Level     Component Value Date/Time   ETH <10 01/05/2022 1810    CT Head without contrast(Personally reviewed): CTH was negative for a large hypodensity concerning for a large territory infarct or hyperdensity concerning for an ICH  CT angio Head and Neck with contrast(Personally reviewed): No LVO  MRI Brain(Personally reviewed): Pending  Impression   HRIDAY STAI is a 71 y.o. male with PMH significant for ianxiety, arthritis, GERD, HTN, HLD, DM2, NICM, chronic CHF (systolic), AICD, GERD, prostrate cancer, MCI who presents with sudden onset aphasia and R sided weakness. The episode self resolved after 15 mins. His neurologic examination is notable for no focal deficit. Has executive dysfunction and not oriented to month and year which per wife is typical for his MCI.  Likely had a L hemispheric  TIA.   Recommendations   - Frequent Neuro checks per stroke unit protocol - Recommend brain imaging with MRI Brain without contrast - Recommend obtaining TTE - Recommend obtaining Lipid panel with LDL - Please start statin if LDL > 70 - Recommend HbA1c - Antithrombotic - Aspirin '81mg'$  daily along with plavix '75mg'$  daily x 21days, followed by Aspirin '81mg'$  daily alone. - Recommend DVT ppx - SBP goal - permissive hypertension first 24 h < 220/110. Held home meds.  - Recommend Telemetry monitoring for arrythmia - Recommend bedside swallow screen prior to PO intake. - Stroke education booklet - Recommend PT/OT/SLP  consult  ______________________________________________________________________   Thank you for the opportunity to take part in the care of this patient. If you have any further questions, please contact the neurology consultation attending.  Signed,  [place] Pager Number [phone] _ _ _   _ __   _ __ _ _  __ __   _ __   __ _

## 2022-01-06 NOTE — Progress Notes (Signed)
Pt has an unsafe ICD for MRI.  He will not be able to have any MRI"S with this device implanted.  Any questions call [phone]

## 2022-01-06 NOTE — TOC Initial Note (Signed)
Transition of Care [facility]) - Initial/Assessment Note    Patient Details  Name: [name] MRN: [id number] Date of Birth: Jul 27, 1950  Transition of [facility]) CM/SW Contact:    Jesse Collet, RN Phone Number: 01/06/2022, 4:13 PM  Clinical Narrative:                  [name] w patient over the phone.  Awaiting MRI Anticipate DC tomorrow.  HH Discussed dispo plan, and therapy recs. He is agreeable to [facility] services, and to have CM choose provider. [facility] accepted referral. Patient will need order for [facility] PT OT  DME Identified need for RW and 3/1 (discussed options for shower seats) Order placed and referral made to [employer] to have equipment delivered to the room prior to DC.  TOC will continue to follow   Expected Discharge Plan: [place] Barriers to Discharge: Continued Medical Work up   Patient Goals and CMS Choice Patient states their goals for this hospitalization and ongoing recovery are:: to go home CMS Medicare.gov Compare Post Acute Care list provided to:: Patient Choice offered to / list presented to : Patient  Expected Discharge Plan and Services Expected Discharge Plan: [place]   Discharge Planning Services: CM Consult Post Acute Care Choice: Home Health, Durable Medical Equipment Living arrangements for the past 2 months: Single Family Home                 DME Arranged: 3-N-1, [place] rolling DME Agency: [employer] Date DME Agency Contacted: 01/06/22 Time DME Agency Contacted: [phone] Representative spoke with at DME Agency: [name] HH Arranged: PT, OT [place] Agency: [place] Date [place]: 01/06/22 Time [place]: [phone] Representative spoke with at [place]: [place] Arrangements/Services Living arrangements for the past 2 months: [place]     Do you feel safe going back to the place where you live?: Yes               Activities of Daily Living Home Assistive  Devices/Equipment: CPAP ADL Screening (condition at time of admission) Patient's cognitive ability adequate to safely complete daily activities?: Yes Is the patient deaf or have difficulty hearing?: No Does the patient have difficulty seeing, even when wearing glasses/contacts?: No Does the patient have difficulty concentrating, remembering, or making decisions?: No Patient able to express need for assistance with ADLs?: Yes Does the patient have difficulty dressing or bathing?: No Independently performs ADLs?: Yes (appropriate for developmental age) Does the patient have difficulty walking or climbing stairs?: No Weakness of Legs: None Weakness of Arms/Hands: None  Permission Sought/Granted                  Emotional Assessment              Admission diagnosis:  TIA (transient ischemic attack) [G45.9] Right arm weakness [R29.898] Transient speech disturbance [R47.9] Patient Active Problem List   Diagnosis Date Noted   TIA (transient ischemic attack) 01/05/2022   Rectal pain 12/21/2021   Difficulty in walking 10/02/2021   URI (upper respiratory infection) 08/04/2021   Low back pain 05/14/2021   Generalized anxiety disorder 04/18/2021   Dysrhythmia 04/18/2021   History of myocardial infarction 04/18/2021   Mild vascular neurocognitive disorder 04/18/2021   History of COVID-19    Aortic atherosclerosis 12/05/2020   Fatigue 12/02/2020   Neuralgia 10/19/2018   Shingles 07/28/2016   GERD (gastroesophageal reflux disease) 07/29/2015   Malignant  neoplasm of prostate ([place]) 09/27/2014   RLS (restless legs syndrome) 11/29/2012   OSA (obstructive sleep apnea) 11/10/2012   Vitamin B 12 deficiency 00/17/4944   Chronic systolic heart failure 96/75/9163   Mild anemia 01/20/2011   Ventricular tachycardia ([facility])    NICM (nonischemic cardiomyopathy) ([place])    Type II diabetes mellitus 08/26/2009   Hyperlipidemia 08/26/2009   Essential hypertension 08/26/2009   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/2009   Barrett's esophagus 09/05/2004   PCP:  Jesse Rail, MD Pharmacy:   [id number] [place] [address]Phone: [phone]: [phone] [id number]- [place] [address] [place]Phone: [phone]: [phone] [place] [place]Phone: [phone]: [phone]    Social Determinants of Health (SDOH) Interventions    Readmission Risk Interventions     No data to display

## 2022-01-06 NOTE — Progress Notes (Addendum)
[facility] PROGRESS NOTE    Progress Note  [name]  FTD:[id number] DOB: January 16, 1951 DOA: 01/05/2022 PCP: Binnie Rail, MD     Brief Narrative:   [name] is an 71 y.o. male past medical history significant for systolic heart failure status post AICD, essential hypertension diabetes mellitus comes in to the ED for difficulty walking and mild weakness of his right upper extremity around 5 PM, CT angio of the head showed small vessel atrophy, CT angio of the head and neck negative for large vessel occlusion, neurology was consulted recommended aspirin and Plavix and stroke work-up.  The wife also relates he has been having some memory deficits over the last 2 years which is suddenly got worse after COVID.  He was also recently started on Aricept due to cognitive impairment versus Lewy body dementia   Assessment/Plan:   TIA (transient ischemic attack) HgbA1c 6.2, fasting lipid panel HDL less than 40 LDL 64 MRI, has been ordered as his AICD is compatible with MRI, to  Rule out CVA PT, OT, pending CT angio of the head showed no large vessel occlusion. Transthoracic Echo, pending Start patient on ASA '81mg'$  daily and plavix '75mg'$  daily for 3 weeks, she was previously on aspirin before coming into the hospital. High-dose statins. BP goal: permissive HTN upto 220/120 mmHg No events telemetry monitoring Stroke team following. Lovenox for DVT prophylaxis.  Type II diabetes mellitus Globin A1c 6.2, he has passed a swallow evaluation, continue sliding scale insulin.  Essential hypertension Allow permissive hypertension blood pressure has been high especially his diastolic, but constantly systolic has been less than 180. Continue sotalol for arrhythmias, use hydralazine IV as needed for systolic blood pressure greater than 200.  Automatic implantable cardioverter-defibrillator in situ Noted.  Chronic systolic heart failure/NICM (nonischemic cardiomyopathy) ([place]) With an  AICD in place, as he has a history of V. tach, currently on sotalol and dig. Digoxin the level 0.2   OSA (obstructive sleep apnea) Continue CPAP at night.  Malignant neoplasm of prostate ([place]) Noted.  DVT prophylaxis: lovenox Family Communication:wife Status is: Inpatient    Code Status:     Code Status Orders  (From admission, onward)           Start     Ordered   01/06/22 0238  Full code  Continuous        01/06/22 0239           Code Status History     This patient has a current code status but no historical code status.         IV Access:   Peripheral IV   Procedures and diagnostic studies:   CT ANGIO HEAD NECK W WO CM  Result Date: 01/05/2022 CLINICAL DATA:  Initial evaluation for neuro deficit, stroke suspected. EXAM: CT ANGIOGRAPHY HEAD AND NECK TECHNIQUE: Multidetector CT imaging of the head and neck was performed using the standard protocol during bolus administration of intravenous contrast. Multiplanar CT image reconstructions and MIPs were obtained to evaluate the vascular anatomy. Carotid stenosis measurements (when applicable) are obtained utilizing NASCET criteria, using the distal internal carotid diameter as the denominator. RADIATION DOSE REDUCTION: This exam was performed according to the departmental dose-optimization program which includes automated exposure control, adjustment of the mA and/or kV according to patient size and/or use of iterative reconstruction technique. CONTRAST:  8m OMNIPAQUE IOHEXOL 350 MG/ML SOLN COMPARISON:  Head CT from earlier the same day. FINDINGS: CTA NECK FINDINGS Aortic arch: Visualized aortic  arch normal caliber with normal branch pattern. No stenosis about the origin the great vessels. Right carotid system: Right common and internal carotid arteries widely patent without stenosis, dissection or occlusion. Mild atheromatous plaque about the right carotid bulb without stenosis. Left carotid system: Left common  and internal carotid arteries widely patent without stenosis, dissection or occlusion. Vertebral arteries: Both vertebral arteries arise from the subclavian arteries. Right vertebral artery dominant, with a diffusely hypoplastic left vertebral artery. Vertebral arteries patent without stenosis or dissection. Skeleton: No discrete or worrisome osseous lesions. Mild cervical spondylosis for age. Other neck: 1.6 cm cystic lesion within the subcutaneous fat of the mid upper posterior neck, indeterminate, but likely a sebaceous cyst (series 5, image 77). Asymmetric fatty atrophy of the left parotid gland noted. No other acute soft tissue abnormality within the neck. Upper chest: Left-sided pacemaker/AICD partially visualized. Visualized upper chest demonstrates no acute finding. Review of the MIP images confirms the above findings CTA HEAD FINDINGS Anterior circulation: Petrous segments patent bilaterally. Mild atheromatous change within the carotid siphons without significant stenosis. A1 segments, anterior communicating artery complex common anterior cerebral arteries widely patent. No M1 stenosis or occlusion. No proximal MCA branch occlusion. Distal MCA branches perfused and symmetric. Posterior circulation: Dominant right vertebral artery widely patent. Left vertebral artery largely terminates in PICA. Both PICA patent. Basilar patent to its distal aspect without stenosis. Superior cerebral arteries patent bilaterally. Both PCAs widely patent and well perfused or distal aspects. Venous sinuses: Patent allowing for timing the contrast bolus. Anatomic variants: None significant.  No aneurysm. Review of the MIP images confirms the above findings IMPRESSION: 1. Negative CTA for large vessel occlusion or other emergent finding. 2. Mild for age atheromatous disease without hemodynamically significant or correctable stenosis. 3. 1.6 cm cystic lesion within the subcutaneous fat of the mid upper posterior neck,  indeterminate, but likely a sebaceous cyst. Correlation with physical exam suggested. Electronically Signed   By: Jeannine Boga M.D.   On: 01/05/2022 20:32   CT HEAD WO CONTRAST  Result Date: 01/05/2022 CLINICAL DATA:  Onset of RIGHT arm heaviness, tongue heaviness, slurred speech and difficulty thinking at 1700 hours today. Symptoms resolved. Question stroke. History prostate cancer, type II diabetes mellitus, MI, non ischemic cardiomyopathy, essential hypertension EXAM: CT HEAD WITHOUT CONTRAST TECHNIQUE: Contiguous axial images were obtained from the base of the skull through the vertex without intravenous contrast. RADIATION DOSE REDUCTION: This exam was performed according to the departmental dose-optimization program which includes automated exposure control, adjustment of the mA and/or kV according to patient size and/or use of iterative reconstruction technique. COMPARISON:  02/17/2021 FINDINGS: Brain: Generalized atrophy. Normal ventricular morphology. No midline shift or mass effect. Small vessel chronic ischemic changes of deep cerebral white matter. No intracranial hemorrhage, mass lesion, evidence of acute infarction, or extra-axial fluid collection. Vascular: No hyperdense vessels. Mild atherosclerotic calcification of internal carotid arteries at skull base. Skull: Intact Sinuses/Orbits: Clear Other: N/A IMPRESSION: Atrophy with small vessel chronic ischemic changes of deep cerebral white matter. No acute intracranial abnormalities. Electronically Signed   By: Lavonia Dana M.D.   On: 01/05/2022 18:45     Medical Consultants:   None.   Subjective:    [name] relates no new complaints.  Objective:    Vitals:   01/06/22 0033 01/06/22 0300 01/06/22 0335 01/06/22 0400  BP: (!) 178/95 (!) 166/85  (!) 175/112  Pulse: 68  60   Resp: '19 18 16 16  '$ Temp: 98.2 F (36.8 C)  98.3 F (36.8 C)  98.6 F (37 C)  TempSrc: Oral Oral  Oral  SpO2: 99%  98% 99%  Weight:       Height:       SpO2: 99 %   Intake/Output Summary (Last 24 hours) at 01/06/2022 0836 Last data filed at 01/06/2022 0600 Gross per 24 hour  Intake --  Output 400 ml  Net -400 ml   Filed Weights   01/05/22 1746  Weight: 89.8 kg    Exam: General exam: In no acute distress. Respiratory system: Good air movement and clear to auscultation. Cardiovascular system: S1 & S2 heard, RRR. No JVD, murmurs, rubs, gallops or clicks.  Gastrointestinal system: Abdomen is nondistended, soft and nontender.  Extremities: No pedal edema. Skin: No rashes, lesions or ulcers Psychiatry: Judgement and insight appear normal. Mood & affect appropriate.    Data Reviewed:    Labs: Basic Metabolic Panel: Recent Labs  Lab 01/05/22 1810 01/06/22 0536  NA 139 140  K 4.2 3.5  CL 107 105  CO2 28 28  GLUCOSE 102* 93  BUN 11 8  CREATININE 0.93 0.87  CALCIUM 9.0 9.4   GFR Estimated Creatinine Clearance: 84.1 mL/min (by C-G formula based on SCr of 0.87 mg/dL). Liver Function Tests: Recent Labs  Lab 01/05/22 1810 01/06/22 0536  AST 19 16  ALT 20 20  ALKPHOS 115 118  BILITOT 0.7 0.9  PROT 7.8 7.2  ALBUMIN 4.2 3.8   No results for input(s): "LIPASE", "AMYLASE" in the last 168 hours. No results for input(s): "AMMONIA" in the last 168 hours. Coagulation profile Recent Labs  Lab 01/05/22 1810  INR 1.1   COVID-19 Labs  No results for input(s): "DDIMER", "FERRITIN", "LDH", "CRP" in the last 72 hours.  Lab Results  Component Value Date   SARSCOV2NAA NEGATIVE 01/05/2022   SARSCOV2NAA Detected (A) 09/06/2020   [place] Not Detected 09/03/2020    CBC: Recent Labs  Lab 01/05/22 1810 01/06/22 0536  WBC 7.1 5.8  NEUTROABS 4.0  --   HGB 12.0* 12.3*  HCT 38.8* 38.2*  MCV 74.0* 72.6*  PLT 226 218   Cardiac Enzymes: No results for input(s): "CKTOTAL", "CKMB", "CKMBINDEX", "TROPONINI" in the last 168 hours. BNP (last 3 results) No results for input(s): "PROBNP" in the last 8760  hours. CBG: No results for input(s): "GLUCAP" in the last 168 hours. D-Dimer: No results for input(s): "DDIMER" in the last 72 hours. Hgb A1c: Recent Labs    01/06/22 0536  HGBA1C 6.2*   Lipid Profile: Recent Labs    01/06/22 0536  CHOL 109  HDL 37*  LDLCALC 64  TRIG 42  CHOLHDL 2.9   Thyroid function studies: No results for input(s): "TSH", "T4TOTAL", "T3FREE", "THYROIDAB" in the last 72 hours.  Invalid input(s): "FREET3" Anemia work up: No results for input(s): "VITAMINB12", "FOLATE", "FERRITIN", "TIBC", "IRON", "RETICCTPCT" in the last 72 hours. Sepsis Labs: Recent Labs  Lab 01/05/22 1810 01/06/22 0536  WBC 7.1 5.8   Microbiology Recent Results (from the past 240 hour(s))  Resp Panel by RT-PCR (Flu A&B, Covid) Anterior Nasal Swab     Status: None   Collection Time: 01/05/22  7:53 PM   Specimen: Anterior Nasal Swab  Result Value Ref Range Status   SARS Coronavirus 2 by RT PCR NEGATIVE NEGATIVE Final    Comment: (NOTE) SARS-CoV-2 target nucleic acids are NOT DETECTED.  The SARS-CoV-2 RNA is generally detectable in upper respiratory specimens during the acute phase of infection. The lowest concentration of  SARS-CoV-2 viral copies this assay can detect is 138 copies/mL. A negative result does not preclude SARS-Cov-2 infection and should not be used as the sole basis for treatment or other patient management decisions. A negative result may occur with  improper specimen collection/handling, submission of specimen other than nasopharyngeal swab, presence of viral mutation(s) within the areas targeted by this assay, and inadequate number of viral copies(<138 copies/mL). A negative result must be combined with clinical observations, patient history, and epidemiological information. The expected result is Negative.  Fact Sheet for Patients:  EntrepreneurPulse.com.au  Fact Sheet for Healthcare Providers:   IncredibleEmployment.be  This test is no t yet approved or cleared by the [place] FDA and  has been authorized for detection and/or diagnosis of SARS-CoV-2 by FDA under an Emergency Use Authorization (EUA). This EUA will remain  in effect (meaning this test can be used) for the duration of the COVID-19 declaration under Section 564(b)(1) of the Act, 21 U.S.C.section 360bbb-3(b)(1), unless the authorization is terminated  or revoked sooner.       Influenza A by PCR NEGATIVE NEGATIVE Final   Influenza B by PCR NEGATIVE NEGATIVE Final    Comment: (NOTE) The Xpert Xpress SARS-CoV-2/FLU/RSV plus assay is intended as an aid in the diagnosis of influenza from Nasopharyngeal swab specimens and should not be used as a sole basis for treatment. Nasal washings and aspirates are unacceptable for Xpert Xpress SARS-CoV-2/FLU/RSV testing.  Fact Sheet for Patients: EntrepreneurPulse.com.au  Fact Sheet for Healthcare Providers: IncredibleEmployment.be  This test is not yet approved or cleared by the [place] FDA and has been authorized for detection and/or diagnosis of SARS-CoV-2 by FDA under an Emergency Use Authorization (EUA). This EUA will remain in effect (meaning this test can be used) for the duration of the COVID-19 declaration under Section 564(b)(1) of the Act, 21 U.S.C. section 360bbb-3(b)(1), unless the authorization is terminated or revoked.  Performed at [facility], [address] [place], [place] [address]      Medications:    [START ON 01/07/2022]  stroke: early stages of recovery book   Does not apply Once   [START ON 01/07/2022]  stroke: early stages of recovery book   Does not apply Once   aspirin  81 mg Oral Daily   Or   aspirin  300 mg Rectal Daily   atorvastatin  10 mg Oral Once per day on Tue Thu Sat   [START ON 01/07/2022] atorvastatin  20 mg Oral Once per day on Sun Mon Wed Fri    clopidogrel  75 mg Oral Daily   cyanocobalamin  1,000 mcg Oral Daily   digoxin  0.0625 mg Oral Daily   enoxaparin (LOVENOX) injection  40 mg Subcutaneous Q24H   gabapentin  100 mg Oral BID   insulin aspart  0-9 Units Subcutaneous TID WC   sotalol  120 mg Oral BID   Continuous Infusions:    LOS: 0 days   Charlynne Cousins  [facility]  01/06/2022, 8:36 AM
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
## 2022-01-06 NOTE — Progress Notes (Signed)
Pt placed on cpap and is tolerating it fairly well. Pt does not like the flow, changed mode to cpap with 4cmH2O pressure. Pt is more comfortable at this time.

## 2022-01-06 NOTE — Plan of Care (Signed)
  Problem: Education: Goal: Knowledge of General Education information will improve Description: Including pain rating scale, medication(s)/side effects and non-pharmacologic comfort measures Outcome: Progressing   Problem: Health Behavior/Discharge Planning: Goal: Ability to manage health-related needs will improve Outcome: Progressing   Problem: Clinical Measurements: Goal: Ability to maintain clinical measurements within normal limits will improve Outcome: Progressing Goal: Will remain free from infection Outcome: Progressing Goal: Diagnostic test results will improve Outcome: Progressing Goal: Respiratory complications will improve Outcome: Progressing Goal: Cardiovascular complication will be avoided Outcome: Progressing   Problem: Activity: Goal: Risk for activity intolerance will decrease Outcome: Progressing   Problem: Nutrition: Goal: Adequate nutrition will be maintained Outcome: Progressing   Problem: Coping: Goal: Level of anxiety will decrease Outcome: Progressing   Problem: Elimination: Goal: Will not experience complications related to bowel motility Outcome: Progressing Goal: Will not experience complications related to urinary retention Outcome: Progressing   Problem: Pain Managment: Goal: General experience of comfort will improve Outcome: Progressing   Problem: Safety: Goal: Ability to remain free from injury will improve Outcome: Progressing   Problem: Skin Integrity: Goal: Risk for impaired skin integrity will decrease Outcome: Progressing   Problem: Education: Goal: Ability to describe self-care measures that may prevent or decrease complications (Diabetes Survival Skills Education) will improve Outcome: Progressing Goal: Individualized Educational Video(s) Outcome: Progressing   Problem: Coping: Goal: Ability to adjust to condition or change in health will improve Outcome: Progressing   Problem: Fluid Volume: Goal: Ability to  maintain a balanced intake and output will improve Outcome: Progressing   Problem: Health Behavior/Discharge Planning: Goal: Ability to identify and utilize available resources and services will improve Outcome: Progressing Goal: Ability to manage health-related needs will improve Outcome: Progressing   Problem: Metabolic: Goal: Ability to maintain appropriate glucose levels will improve Outcome: Progressing   Problem: Nutritional: Goal: Maintenance of adequate nutrition will improve Outcome: Progressing Goal: Progress toward achieving an optimal weight will improve Outcome: Progressing   Problem: Skin Integrity: Goal: Risk for impaired skin integrity will decrease Outcome: Progressing   Problem: Tissue Perfusion: Goal: Adequacy of tissue perfusion will improve Outcome: Progressing   Problem: Education: Goal: Knowledge of secondary prevention will improve (SELECT ALL) Outcome: Progressing

## 2022-01-06 NOTE — H&P (Signed)
History and Physical    [name] GGY:[id number] DOB: 03-Jul-1950 DOA: 01/05/2022  PCP: Binnie Rail, MD  Patient coming from: Home.  Chief Complaint: Difficulty talking.  HPI: [name] is a 71 y.o. male with history of chronic systolic CHF status post AICD placement, hypertension, diabetes mellitus, anemia was brought to the ER at med center after patient had brief episode of difficulty talking and also mild weakness of the right upper extremity at around 5 PM yesterday on January 05, 2022.  Patient was with his wife when the incident happened.  Patient was unable to bring out his words and later was slurred.  At the same time complaint of right upper extremity weakness.  All symptoms resolved within 15 minutes.  Was brought to the ER.  ED Course: In the ER CT head followed by CT angiogram of the head and neck was done.  Did not show any large vessel obstruction or anything acute.  Neurologist on-call was consulted patient was started on aspirin Plavix after passing stroke swallow.  Admitted for further work-up.  Review of Systems: As per HPI, rest all negative.   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   Not compatible with MRI    Barrett's esophagus 09/05/2004   2 cm changes seen at index EGD   Cataract    Chronic back pain 03/50/0938   Chronic systolic heart failure    Colon polyps    tubular adenoma   Dysrhythmia    Essential hypertension 08/26/2009   Fatigue 12/02/2020   Generalized anxiety disorder    since defibrillator placement   GERD (gastroesophageal reflux disease)    Hip pain 12/14/2016   History of COVID-19    History of myocardial infarction    Hyperlipidemia    Left knee pain 12/14/2016   Malignant neoplasm of prostate 09/27/2014   s/p prostatectomy   Mild anemia 01/20/2011   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 b. s/p Medtronic ICD.   Obstructive sleep apnea 11/10/2012   no CPAP use   Right foot pain 12/15/2017   RLS (restless legs syndrome) 11/29/2012   S/P radiation therapy    10/10/2014 through 11/26/2014; Prostate bed 6600 cGy in 33 sessions   Shingles 07/28/2016   Type II diabetes mellitus 08/26/2009   Ventricular tachycardia ([place])    a. s/p ICD (generator change 09/2012). b. 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 and placed on sotalol   Vitamin B 12 deficiency 03/06/2011   Monthly B12 shots initiated  2011. Nasally  inhaled B12 as of 07/2012    Past Surgical History:  Procedure Laterality Date   CARDIAC DEFIBRILLATOR PLACEMENT  03/11/2004   Medtronic Maximo single lead   COLONOSCOPY W/ BIOPSIES AND POLYPECTOMY  08/2004, 02/24/11   42m adenoma in 2006,  5 mm polyp (not recovered) 2012   [place] 10/08/2015   Procedure: CYSTOSCOPY WITH DIRECT VISION INTERNAL URETHROTOMY;  Surgeon: BNickie Retort MD;  Location: [facility];  Service: Urology;  Laterality: N/A;   IMPLANTABLE CARDIOVERTER DEFIBRILLATOR GENERATOR CHANGE N/A 10/07/2012   Procedure: IMPLANTABLE CARDIOVERTER DEFIBRILLATOR GENERATOR CHANGE;  Surgeon: GEvans Lance MD;  [facility];  Service: Cardiovascular;  Laterality: N/A;   PROSTATECTOMY  06/2003   Dr NJanice Norrie  RECTAL  SURGERY     UPPER GASTROINTESTINAL ENDOSCOPY  08/2004, 02/24/11   Barrett's esophagus     reports that he has never smoked. He has never used smokeless tobacco. He reports that he does not drink alcohol and does not use drugs.  Allergies  Allergen Reactions   Zantac [Ranitidine Hcl]     itching    Family History  Problem Relation Age of Onset   Dementia Mother    Hypertension Mother    Coronary artery disease Father    Prostate cancer Father    Diabetes Father    Dementia Sister    Dementia Brother    Diabetes Paternal  Grandmother    Dementia Maternal Aunt    Stroke Neg Hx    Colon cancer Neg Hx    Colon polyps Neg Hx    Esophageal cancer Neg Hx    Rectal cancer Neg Hx    Stomach cancer Neg Hx     Prior to Admission medications   Medication Sig Start Date End Date Taking? Authorizing Provider  ACCU-CHEK GUIDE test strip USE TO CHECK SUGAR 4 TIMES A DAY 06/09/21   Binnie Rail, MD  aspirin (ASPIRIN LOW DOSE) 81 MG EC tablet Take 1 tablet (81 mg total) by mouth daily. Swallow whole. 09/19/21   Binnie Rail, MD  atorvastatin (LIPITOR) 20 MG tablet TAKE 1 TABLET BY MOUTH DAILY EXCEPT 1/2 TABLET ON TUESDAY, THURSDAY AND SATURDAY. 07/28/21   Binnie Rail, MD  carvedilol (COREG) 25 MG tablet TAKE 1 TABLET TWICE DAILY WITH MEALS 12/01/21   Evans Lance, MD  digoxin (LANOXIN) 0.125 MG tablet Take 0.5 tablets (0.0625 mg total) by mouth daily. 12/12/21   Shirley Friar, PA-C  docusate sodium (COLACE) 100 MG capsule Take 1 capsule (100 mg total) by mouth 2 (two) times daily as needed for mild constipation. 05/30/21   Burns, Claudina Lick, MD  donepezil (ARICEPT) 10 MG tablet TAKE HALF TABLET (5 MG) DAILY FOR 2 WEEKS, THEN INCREASE TO THE FULL TABLET AT 10 MG DAILY 12/03/21   Shawn Route, Coralee Pesa, PA-C  famotidine (PEPCID) 20 MG tablet Take 1 tablet (20 mg total) by mouth daily. 09/19/21   Binnie Rail, MD  gabapentin (NEURONTIN) 100 MG capsule Take 1 capsule (100 mg total) by mouth 2 (two) times daily. 11/20/21   Rigoberto Noel, MD  Lancets [facility] ULTRASOFT) lancets Use as instructed to test sugar daily and prn. Dx E11.9 01/16/17   Binnie Rail, MD  losartan (COZAAR) 25 MG tablet Take 1 tablet (25 mg total) by mouth at bedtime. 01/01/22   Baldwin Jamaica, PA-C  omeprazole (PRILOSEC) 20 MG capsule TAKE 1 CAPSULE DAILY 30 MINUTES BEFORE BREAKFAST 12/01/21   Binnie Rail, MD  sitaGLIPtin-metformin (JANUMET) 50-1000 MG tablet Take 1 tablet by mouth daily. Take with a meal 05/14/21   Binnie Rail, MD  sotalol  (BETAPACE) 120 MG tablet TAKE 1 TABLET TWICE DAILY 11/10/21   Binnie Rail, MD  traMADol (ULTRAM) 50 MG tablet Take 1 tablet (50 mg total) by mouth every 6 (six) hours as needed. 11/20/21   Persons, Bevely Palmer, PA  vitamin B-12 (CYANOCOBALAMIN) 1000 MCG tablet TAKE 1 TABLET EVERY DAY 12/01/21   Binnie Rail, MD    Physical Exam: Constitutional: Moderately built and nourished. Vitals:   01/05/22 2230 01/05/22 2300 01/05/22 2330 01/06/22 0033  BP: (!) 159/85 (!) 174/100 (!) 167/88 (!) 178/95  Pulse: 77 72 66 68  Resp:  $'19 18 17 19  'v$ Temp:    98.2 F (36.8 C)  TempSrc:    Oral  SpO2: 96% 100% 96% 99%  Weight:      Height:       Eyes: Anicteric no pallor. ENMT: No discharge from the ears eyes nose and mouth. Neck: No mass felt.  No neck rigidity. Respiratory: No rhonchi or crepitations. Cardiovascular: S1-S2 heard. Abdomen: Soft nontender bowel sound present. Musculoskeletal: No edema. Skin: No rash. Neurologic: Alert awake oriented to time place and person.  Moves all extremities 5 x 5.  No facial asymmetry tongue is midline pupils are equal and reacting to light. Psychiatric: Appears normal.  Normal affect.   Labs on Admission: I have personally reviewed following labs and imaging studies  CBC: Recent Labs  Lab 01/05/22 1810  WBC 7.1  NEUTROABS 4.0  HGB 12.0*  HCT 38.8*  MCV 74.0*  PLT 962   Basic Metabolic Panel: Recent Labs  Lab 01/05/22 1810  NA 139  K 4.2  CL 107  CO2 28  GLUCOSE 102*  BUN 11  CREATININE 0.93  CALCIUM 9.0   GFR: Estimated Creatinine Clearance: 78.7 mL/min (by C-G formula based on SCr of 0.93 mg/dL). Liver Function Tests: Recent Labs  Lab 01/05/22 1810  AST 19  ALT 20  ALKPHOS 115  BILITOT 0.7  PROT 7.8  ALBUMIN 4.2   No results for input(s): "LIPASE", "AMYLASE" in the last 168 hours. No results for input(s): "AMMONIA" in the last 168 hours. Coagulation Profile: Recent Labs  Lab 01/05/22 1810  INR 1.1   Cardiac Enzymes: No  results for input(s): "CKTOTAL", "CKMB", "CKMBINDEX", "TROPONINI" in the last 168 hours. BNP (last 3 results) No results for input(s): "PROBNP" in the last 8760 hours. HbA1C: No results for input(s): "HGBA1C" in the last 72 hours. CBG: No results for input(s): "GLUCAP" in the last 168 hours. Lipid Profile: No results for input(s): "CHOL", "HDL", "LDLCALC", "TRIG", "CHOLHDL", "LDLDIRECT" in the last 72 hours. Thyroid Function Tests: No results for input(s): "TSH", "T4TOTAL", "FREET4", "T3FREE", "THYROIDAB" in the last 72 hours. Anemia Panel: No results for input(s): "VITAMINB12", "FOLATE", "FERRITIN", "TIBC", "IRON", "RETICCTPCT" in the last 72 hours. Urine analysis:    Component Value Date/Time   COLORURINE YELLOW 01/05/2022 1810   APPEARANCEUR CLEAR 01/05/2022 1810   LABSPEC 1.025 01/05/2022 1810   LABSPEC 1.005 10/29/2014 0822   PHURINE 5.5 01/05/2022 1810   GLUCOSEU NEGATIVE 01/05/2022 1810   GLUCOSEU NEGATIVE 11/14/2020 1204   GLUCOSEU Negative 10/29/2014 0822   HGBUR NEGATIVE 01/05/2022 1810   BILIRUBINUR NEGATIVE 01/05/2022 1810   BILIRUBINUR negative 08/24/2019 1130   BILIRUBINUR Negative 10/29/2014 0822   KETONESUR NEGATIVE 01/05/2022 1810   PROTEINUR NEGATIVE 01/05/2022 1810   UROBILINOGEN 0.2 11/14/2020 1204   UROBILINOGEN 0.2 10/29/2014 0822   NITRITE NEGATIVE 01/05/2022 1810   LEUKOCYTESUR NEGATIVE 01/05/2022 1810   LEUKOCYTESUR Negative 10/29/2014 0822   Sepsis Labs: '@LABRCNTIP'$ (procalcitonin:4,lacticidven:4) ) Recent Results (from the past 240 hour(s))  Resp Panel by RT-PCR (Flu A&B, Covid) Anterior Nasal Swab     Status: None   Collection Time: 01/05/22  7:53 PM   Specimen: Anterior Nasal Swab  Result Value Ref Range Status   SARS Coronavirus 2 by RT PCR NEGATIVE NEGATIVE Final    Comment: (NOTE) SARS-CoV-2 target nucleic acids are NOT DETECTED.  The SARS-CoV-2 RNA is generally detectable in upper respiratory specimens during the acute phase of  infection. The lowest concentration of SARS-CoV-2 viral copies this assay can detect is  138 copies/mL. A negative result does not preclude SARS-Cov-2 infection and should not be used as the sole basis for treatment or other patient management decisions. A negative result may occur with  improper specimen collection/handling, submission of specimen other than nasopharyngeal swab, presence of viral mutation(s) within the areas targeted by this assay, and inadequate number of viral copies(<138 copies/mL). A negative result must be combined with clinical observations, patient history, and epidemiological information. The expected result is Negative.  Fact Sheet for Patients:  EntrepreneurPulse.com.au  Fact Sheet for Healthcare Providers:  IncredibleEmployment.be  This test is no t yet approved or cleared by the [place] FDA and  has been authorized for detection and/or diagnosis of SARS-CoV-2 by FDA under an Emergency Use Authorization (EUA). This EUA will remain  in effect (meaning this test can be used) for the duration of the COVID-19 declaration under Section 564(b)(1) of the Act, 21 U.S.C.section 360bbb-3(b)(1), unless the authorization is terminated  or revoked sooner.       Influenza A by PCR NEGATIVE NEGATIVE Final   Influenza B by PCR NEGATIVE NEGATIVE Final    Comment: (NOTE) The Xpert Xpress SARS-CoV-2/FLU/RSV plus assay is intended as an aid in the diagnosis of influenza from Nasopharyngeal swab specimens and should not be used as a sole basis for treatment. Nasal washings and aspirates are unacceptable for Xpert Xpress SARS-CoV-2/FLU/RSV testing.  Fact Sheet for Patients: EntrepreneurPulse.com.au  Fact Sheet for Healthcare Providers: IncredibleEmployment.be  This test is not yet approved or cleared by the [place] FDA and has been authorized for detection and/or diagnosis of SARS-CoV-2  by FDA under an Emergency Use Authorization (EUA). This EUA will remain in effect (meaning this test can be used) for the duration of the COVID-19 declaration under Section 564(b)(1) of the Act, 21 U.S.C. section 360bbb-3(b)(1), unless the authorization is terminated or revoked.  Performed at [facility], [address] [place], [place] [address]      Radiological Exams on Admission: CT ANGIO HEAD NECK W WO CM  Result Date: 01/05/2022 CLINICAL DATA:  Initial evaluation for neuro deficit, stroke suspected. EXAM: CT ANGIOGRAPHY HEAD AND NECK TECHNIQUE: Multidetector CT imaging of the head and neck was performed using the standard protocol during bolus administration of intravenous contrast. Multiplanar CT image reconstructions and MIPs were obtained to evaluate the vascular anatomy. Carotid stenosis measurements (when applicable) are obtained utilizing NASCET criteria, using the distal internal carotid diameter as the denominator. RADIATION DOSE REDUCTION: This exam was performed according to the departmental dose-optimization program which includes automated exposure control, adjustment of the mA and/or kV according to patient size and/or use of iterative reconstruction technique. CONTRAST:  33m OMNIPAQUE IOHEXOL 350 MG/ML SOLN COMPARISON:  Head CT from earlier the same day. FINDINGS: CTA NECK FINDINGS Aortic arch: Visualized aortic arch normal caliber with normal branch pattern. No stenosis about the origin the great vessels. Right carotid system: Right common and internal carotid arteries widely patent without stenosis, dissection or occlusion. Mild atheromatous plaque about the right carotid bulb without stenosis. Left carotid system: Left common and internal carotid arteries widely patent without stenosis, dissection or occlusion. Vertebral arteries: Both vertebral arteries arise from the subclavian arteries. Right vertebral artery dominant, with a diffusely hypoplastic left vertebral  artery. Vertebral arteries patent without stenosis or dissection. Skeleton: No discrete or worrisome osseous lesions. Mild cervical spondylosis for age. Other neck: 1.6 cm cystic lesion within the subcutaneous fat of the mid upper posterior neck, indeterminate, but likely a sebaceous  cyst (series 5, image 77). Asymmetric fatty atrophy of the left parotid gland noted. No other acute soft tissue abnormality within the neck. Upper chest: Left-sided pacemaker/AICD partially visualized. Visualized upper chest demonstrates no acute finding. Review of the MIP images confirms the above findings CTA HEAD FINDINGS Anterior circulation: Petrous segments patent bilaterally. Mild atheromatous change within the carotid siphons without significant stenosis. A1 segments, anterior communicating artery complex common anterior cerebral arteries widely patent. No M1 stenosis or occlusion. No proximal MCA branch occlusion. Distal MCA branches perfused and symmetric. Posterior circulation: Dominant right vertebral artery widely patent. Left vertebral artery largely terminates in PICA. Both PICA patent. Basilar patent to its distal aspect without stenosis. Superior cerebral arteries patent bilaterally. Both PCAs widely patent and well perfused or distal aspects. Venous sinuses: Patent allowing for timing the contrast bolus. Anatomic variants: None significant.  No aneurysm. Review of the MIP images confirms the above findings IMPRESSION: 1. Negative CTA for large vessel occlusion or other emergent finding. 2. Mild for age atheromatous disease without hemodynamically significant or correctable stenosis. 3. 1.6 cm cystic lesion within the subcutaneous fat of the mid upper posterior neck, indeterminate, but likely a sebaceous cyst. Correlation with physical exam suggested. Electronically Signed   By: Jeannine Boga M.D.   On: 01/05/2022 20:32   CT HEAD WO CONTRAST  Result Date: 01/05/2022 CLINICAL DATA:  Onset of RIGHT arm  heaviness, tongue heaviness, slurred speech and difficulty thinking at 1700 hours today. Symptoms resolved. Question stroke. History prostate cancer, type II diabetes mellitus, MI, non ischemic cardiomyopathy, essential hypertension EXAM: CT HEAD WITHOUT CONTRAST TECHNIQUE: Contiguous axial images were obtained from the base of the skull through the vertex without intravenous contrast. RADIATION DOSE REDUCTION: This exam was performed according to the departmental dose-optimization program which includes automated exposure control, adjustment of the mA and/or kV according to patient size and/or use of iterative reconstruction technique. COMPARISON:  02/17/2021 FINDINGS: Brain: Generalized atrophy. Normal ventricular morphology. No midline shift or mass effect. Small vessel chronic ischemic changes of deep cerebral white matter. No intracranial hemorrhage, mass lesion, evidence of acute infarction, or extra-axial fluid collection. Vascular: No hyperdense vessels. Mild atherosclerotic calcification of internal carotid arteries at skull base. Skull: Intact Sinuses/Orbits: Clear Other: N/A IMPRESSION: Atrophy with small vessel chronic ischemic changes of deep cerebral white matter. No acute intracranial abnormalities. Electronically Signed   By: Lavonia Dana M.D.   On: 01/05/2022 18:45    EKG: Independently reviewed.  Normal sinus rhythm.  Assessment/Plan Principal Problem:   TIA (transient ischemic attack) Active Problems:   Type II diabetes mellitus   Essential hypertension   Automatic implantable cardioverter-defibrillator in situ   NICM (nonischemic cardiomyopathy) (HCC)   OSA (obstructive sleep apnea)   Malignant neoplasm of prostate ([facility]CC)    TIA -patient's symptoms are concerning for TIA.  Appreciate neurology consult and recommendation.  MRI brain has been ordered if compatible with AICD.  2D echo.  Aspirin and Plavix has been ordered.  Patient did pass stroke swallow.  Neurochecks.  Check lipid  panel hemoglobin A1c. History of hypertension -allow for permissive hypertension. TR nonischemic cardiomyopathy status post AICD placement and history of V. tach presently on sotalol also takes digoxin.  Digoxin levels are pending. Sleep apnea on CPAP at bedtime. Diabetes mellitus type 2 on sliding scale coverage. Anemia follow CBC.   DVT prophylaxis: Lovenox. Code Status: Full code. Family Communication: Patient's wife at the bedside. Disposition Plan: Home. Consults called: Neurology. Admission status: Observation.   Jaquita Rector  Aida Puffer MD [facility] Pager [phone].  If 7PM-7AM, please contact night-coverage [email or web address] Password [facility]  01/06/2022, 2:39 AM
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
## 2022-01-06 NOTE — Progress Notes (Signed)
Pt refused CPAP for tonight.  

## 2022-01-06 NOTE — Progress Notes (Signed)
  Echocardiogram 2D Echocardiogram has been performed.  [name] 01/06/2022, 11:11 AM

## 2022-01-07 DIAGNOSIS — Z95810 Presence of automatic (implantable) cardiac defibrillator: Secondary | ICD-10-CM | POA: Diagnosis not present

## 2022-01-07 DIAGNOSIS — G459 Transient cerebral ischemic attack, unspecified: Secondary | ICD-10-CM | POA: Diagnosis not present

## 2022-01-07 DIAGNOSIS — I10 Essential (primary) hypertension: Secondary | ICD-10-CM | POA: Diagnosis not present

## 2022-01-07 DIAGNOSIS — C61 Malignant neoplasm of prostate: Secondary | ICD-10-CM | POA: Diagnosis not present

## 2022-01-07 LAB — GLUCOSE, CAPILLARY
Glucose-Capillary: 109 mg/dL — ABNORMAL HIGH (ref 70–99)
Glucose-Capillary: 163 mg/dL — ABNORMAL HIGH (ref 70–99)

## 2022-01-07 MED ORDER — DIGOXIN 125 MCG PO TABS: 0.0625 mg | ORAL_TABLET | Freq: Every day | ORAL | 3 refills | Status: DC

## 2022-01-07 MED ORDER — MELATONIN 5 MG PO TABS: 5.0000 mg | ORAL_TABLET | Freq: Every evening | ORAL | Status: DC | PRN

## 2022-01-07 MED ORDER — PANTOPRAZOLE SODIUM 40 MG PO TBEC: 40.0000 mg | DELAYED_RELEASE_TABLET | Freq: Every day | ORAL | 2 refills | Status: DC

## 2022-01-07 MED ORDER — CLOPIDOGREL BISULFATE 75 MG PO TABS: 75.0000 mg | ORAL_TABLET | Freq: Every day | ORAL | 3 refills | Status: DC

## 2022-01-07 MED ORDER — ASPIRIN 81 MG PO CHEW: 81.0000 mg | CHEWABLE_TABLET | Freq: Every day | ORAL | 0 refills | Status: AC
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
## 2022-01-07 NOTE — Progress Notes (Signed)
Pt and spouse verbalize understanding of all discharge instructions including where to pick up prescriptions. All questions answered. He has all belongings with him including cell phone, charger, clothing, glasses.  He has walker and 3:1 with him.

## 2022-01-07 NOTE — Progress Notes (Signed)
EMMI [email or web address]

## 2022-01-07 NOTE — Discharge Summary (Signed)
Physician Discharge Summary   Patient: [name] MRN: [id number] DOB: [birth year]  Admit date:     01/05/2022  Discharge date: 01/07/22  Discharge Physician: Oswald Hillock   PCP: Binnie Rail, MD   Recommendations at discharge:   Follow-up cardiology as outpatient  Discharge Diagnoses: Principal Problem:   TIA (transient ischemic attack) Active Problems:   Type II diabetes mellitus   Essential hypertension   Automatic implantable cardioverter-defibrillator in situ   NICM (nonischemic cardiomyopathy) (HCC)   OSA (obstructive sleep apnea)   Malignant neoplasm of prostate ([place])  Resolved Problems:   * No resolved hospital problems. *  Hospital Course: 71 y.o. male past medical history significant for systolic heart failure status post AICD, essential hypertension diabetes mellitus comes in to the ED for difficulty walking and mild weakness of his right upper extremity around 5 PM, CT angio of the head showed small vessel atrophy, CT angio of the head and neck negative for large vessel occlusion, neurology was consulted recommended aspirin and Plavix and stroke work-up.  The wife also relates he has been having some memory deficits over the last 2 years which is suddenly got worse after COVID.  He was also recently started on Aricept due to cognitive impairment versus Lewy body dementia    Assessment and Plan:  TIA (transient ischemic attack) HgbA1c 6.2, fasting lipid panel HDL less than 40 LDL 64 MRI, has been ordered as his AICD is compatible with MRI, to  Rule out CVA PT, OT, recommends home health PT/OT CT angio of the head showed no large vessel occlusion. Transthoracic Echo showed EF of 35 to 40% Start patient on ASA '81mg'$  daily and plavix '75mg'$  daily for 3 weeks, then continue with Plavix 75 mg daily High-dose statins. -He will need 30-day external heart monitor at discharge for paroxysmal atrial fibrillation -Cardiology has been consulted for that and they will arrange  for outpatient heart monitor     Type II diabetes mellitus -Hemoglobin A1c 6.2 -Continue home regimen   Essential hypertension -Continue Coreg, losartan 25 mg daily   Automatic implantable cardioverter-defibrillator in situ -MRI could not be obtained   Chronic systolic heart failure/NICM (nonischemic cardiomyopathy) ([place]) With an AICD in place, as he has a history of V. tach, currently on sotalol and dig. Digoxin the level 0.2 -Patient to continue taking digoxin 0.0625 mg daily -Follow-up EP as outpatient   OSA (obstructive sleep apnea) Continue CPAP at night.   Malignant neoplasm of prostate [facility])        Consultants: Neurology Procedures performed:  Disposition: Home Diet recommendation:  Discharge Diet Orders (From admission, onward)     Start     Ordered   01/07/22 0000  Diet - low sodium heart healthy        01/07/22 1443           Cardiac diet DISCHARGE MEDICATION: Allergies as of 01/07/2022       Reactions   Zantac [ranitidine Hcl]    itching        Medication List     STOP taking these medications    aspirin EC 81 MG tablet Commonly known as: Aspirin Low Dose Replaced by: aspirin 81 MG chewable tablet   omeprazole 20 MG capsule Commonly known as: PRILOSEC       TAKE these medications    Accu-Chek Guide test strip Generic drug: glucose blood USE TO CHECK SUGAR 4 TIMES A DAY   acetaminophen 500 MG tablet Commonly known as:  TYLENOL Take 1,000 mg by mouth every 6 (six) hours as needed for mild pain.   aspirin 81 MG chewable tablet Chew 1 tablet (81 mg total) by mouth daily for 21 days. Take aspirin and Plavix for 3 weeks, then stop taking aspirin after 3 weeks and continue with Plavix Start taking on: January 08, 2022 Replaces: aspirin EC 81 MG tablet   atorvastatin 20 MG tablet Commonly known as: LIPITOR TAKE 1 TABLET BY MOUTH DAILY EXCEPT 1/2 TABLET ON TUESDAY, THURSDAY AND SATURDAY. What changed: See the new instructions.    carvedilol 25 MG tablet Commonly known as: COREG TAKE 1 TABLET TWICE DAILY WITH MEALS   clopidogrel 75 MG tablet Commonly known as: PLAVIX Take 1 tablet (75 mg total) by mouth daily. Start taking on: January 08, 2022   cyanocobalamin 1000 MCG tablet Commonly known as: VITAMIN B12 TAKE 1 TABLET EVERY DAY   digoxin 0.125 MG tablet Commonly known as: LANOXIN Take 0.5 tablets (0.0625 mg total) by mouth daily.   docusate sodium 100 MG capsule Commonly known as: Colace Take 1 capsule (100 mg total) by mouth 2 (two) times daily as needed for mild constipation.   donepezil 10 MG tablet Commonly known as: ARICEPT TAKE HALF TABLET (5 MG) DAILY FOR 2 WEEKS, THEN INCREASE TO THE FULL TABLET AT 10 MG DAILY What changed: See the new instructions.   famotidine 20 MG tablet Commonly known as: PEPCID Take 1 tablet (20 mg total) by mouth daily.   gabapentin 100 MG capsule Commonly known as: NEURONTIN Take 1 capsule (100 mg total) by mouth 2 (two) times daily.   Janumet 50-1000 MG tablet Generic drug: sitaGLIPtin-metformin Take 1 tablet by mouth daily. Take with a meal   losartan 25 MG tablet Commonly known as: COZAAR Take 1 tablet (25 mg total) by mouth at bedtime.   onetouch ultrasoft lancets Use as instructed to test sugar daily and prn. Dx E11.9   pantoprazole 40 MG tablet Commonly known as: Protonix Take 1 tablet (40 mg total) by mouth daily.   sotalol 120 MG tablet Commonly known as: BETAPACE TAKE 1 TABLET TWICE DAILY   traMADol 50 MG tablet Commonly known as: ULTRAM Take 1 tablet (50 mg total) by mouth every 6 (six) hours as needed. What changed: reasons to take this   Vitamin D 50 MCG (2000 UT) tablet Take 2,000 Units by mouth daily.               Durable Medical Equipment  (From admission, onward)           Start     Ordered   01/06/22 1607  For home use only DME Walker rolling  Once       Question Answer Comment  Walker: With Boomer Wheels    Patient needs a walker to treat with the following condition Weakness      01/06/22 1606   01/06/22 1607  For home use only DME 3 n 1  Once        01/06/22 1606            Follow-up Information     Baldwin Jamaica, PA-C. Schedule an appointment as soon as possible for a visit in 1 month(s).   Specialty: Cardiology Contact information: [address] [place] [address] [phone]         Care, [facility] Follow up.   Specialty: Home Health Services Why: for home health therapy, they will call you to  set up time to come out to your house Contact information: [place] [address] [phone]                Discharge Exam: Danley Danker Weights   01/05/22 1746  Weight: 89.8 kg   General-appears in no acute distress Heart-S1-S2, regular, no murmur auscultated Lungs-clear to auscultation bilaterally, no wheezing or crackles auscultated Abdomen-soft, nontender, no organomegaly Extremities-no edema in the lower extremities Neuro-alert, oriented x3, no focal deficit noted  Condition at discharge: good  The results of significant diagnostics from this hospitalization (including imaging, microbiology, ancillary and laboratory) are listed below for reference.   Imaging Studies: CT HEAD WO CONTRAST (5MM)  Result Date: 01/06/2022 CLINICAL DATA:  Provided history: Stroke, follow-up. EXAM: CT HEAD WITHOUT CONTRAST TECHNIQUE: Contiguous axial images were obtained from the base of the skull through the vertex without intravenous contrast. RADIATION DOSE REDUCTION: This exam was performed according to the departmental dose-optimization program which includes automated exposure control, adjustment of the mA and/or kV according to patient size and/or use of iterative reconstruction technique. COMPARISON:  Non-contrast head CT and CT angiogram head/neck 01/05/2022. FINDINGS: Streak/beam hardening artifact arising from dental  restoration partially obscures the posterior fossa. Brain: No age advanced or lobar predominant parenchymal atrophy. Mild patchy and ill-defined hypoattenuation within the cerebral white matter, nonspecific but compatible with chronic small vessel ischemic disease. There is no acute intracranial hemorrhage. No demarcated cortical infarct. No extra-axial fluid collection. No evidence of an intracranial mass. No midline shift. Vascular: No hyperdense vessel. Atherosclerotic calcifications. Skull: No fracture or aggressive osseous lesion. Sinuses/Orbits: No mass or acute finding within the imaged orbits. Trace scattered paranasal sinus mucosal thickening. IMPRESSION: Streak/beam hardening artifact partially obscures the posterior fossa. Within this limitation, no evidence of acute intracranial abnormality. Mild chronic small vessel ischemic changes within the cerebral white matter. Electronically Signed   By: Kellie Simmering D.O.   On: 01/06/2022 21:06   ECHOCARDIOGRAM COMPLETE  Result Date: 01/06/2022    ECHOCARDIOGRAM REPORT   Patient Name:   JENTRY WARNELL Date of Exam: 01/06/2022 Medical Rec #:  [id number]      Height:       71.0 in Accession #:    [id number]     Weight:       198.0 lb Date of Birth:  1951/01/16      BSA:          2.100 m Patient Age:    21 years       BP:           175/112 mmHg Patient Gender: M              HR:           70 bpm. Exam Location:  Inpatient Procedure: 2D Echo, 3D Echo, Cardiac Doppler and Color Doppler Indications:    Stroke  History:        Patient has prior history of Echocardiogram examinations, most                 recent 05/10/2019. Cardiomyopathy, TIA; Risk                 Factors:Dyslipidemia, Diabetes and Hypertension.  Sonographer:    Roseanna Rainbow RDCS Referring Phys: Alferd Patee [facility] IMPRESSIONS  1. Left ventricular ejection fraction, by estimation, is 35 to 40%. The left ventricle has moderately decreased function. The left ventricle demonstrates global hypokinesis. Left  ventricular diastolic parameters are consistent with Grade I  diastolic dysfunction (impaired relaxation).  2. Right ventricular systolic function is normal. The right ventricular size is normal. There is normal pulmonary artery systolic pressure.  3. The mitral valve is normal in structure. No evidence of mitral valve regurgitation.  4. The aortic valve was not well visualized. Aortic valve regurgitation is not visualized.  5. The inferior vena cava is normal in size with greater than 50% respiratory variability, suggesting right atrial pressure of 3 mmHg. Comparison(s): No significant change from prior study. FINDINGS  Left Ventricle: Left ventricular ejection fraction, by estimation, is 35 to 40%. The left ventricle has moderately decreased function. The left ventricle demonstrates global hypokinesis. The left ventricular internal cavity size was normal in size. There is no left ventricular hypertrophy. Left ventricular diastolic parameters are consistent with Grade I diastolic dysfunction (impaired relaxation). Right Ventricle: The right ventricular size is normal. No increase in right ventricular wall thickness. Right ventricular systolic function is normal. There is normal pulmonary artery systolic pressure. The tricuspid regurgitant velocity is 2.31 m/s, and  with an assumed right atrial pressure of 3 mmHg, the estimated right ventricular systolic pressure is 54.5 mmHg. Left Atrium: Left atrial size was normal in size. Right Atrium: Right atrial size was normal in size. Pericardium: There is no evidence of pericardial effusion. Mitral Valve: The mitral valve is normal in structure. No evidence of mitral valve regurgitation. Tricuspid Valve: The tricuspid valve is normal in structure. Tricuspid valve regurgitation is not demonstrated. No evidence of tricuspid stenosis. Aortic Valve: The aortic valve was not well visualized. Aortic valve regurgitation is not visualized. Pulmonic Valve: The pulmonic valve was  normal in structure. Pulmonic valve regurgitation is not visualized. No evidence of pulmonic stenosis. Aorta: The aortic root, ascending aorta and aortic arch are all structurally normal, with no evidence of dilitation or obstruction. Venous: The inferior vena cava is normal in size with greater than 50% respiratory variability, suggesting right atrial pressure of 3 mmHg. IAS/Shunts: No atrial level shunt detected by color flow Doppler. Additional Comments: A device lead is visualized in the right ventricle and right atrium.  LEFT VENTRICLE PLAX 2D LVIDd:         5.20 cm      Diastology LVIDs:         4.60 cm      LV e' medial:    6.31 cm/s LV PW:         1.50 cm      LV E/e' medial:  10.0 LV IVS:        0.80 cm      LV e' lateral:   9.14 cm/s LVOT diam:     2.40 cm      LV E/e' lateral: 6.9 LV SV:         94 LV SV Index:   45 LVOT Area:     4.52 cm  LV Volumes (MOD) LV vol d, MOD A2C: 140.0 ml LV vol d, MOD A4C: 171.0 ml LV vol s, MOD A2C: 89.4 ml LV vol s, MOD A4C: 102.0 ml LV SV MOD A2C:     50.7 ml LV SV MOD A4C:     171.0 ml LV SV MOD BP:      59.1 ml RIGHT VENTRICLE             IVC RV S prime:     15.30 cm/s  IVC diam: 1.80 cm TAPSE (M-mode): 1.6 cm LEFT ATRIUM  Index        RIGHT ATRIUM           Index LA diam:        4.00 cm 1.91 cm/m   RA Area:     15.30 cm LA Vol (A2C):   71.2 ml 33.91 ml/m  RA Volume:   38.20 ml  18.19 ml/m LA Vol (A4C):   51.1 ml 24.34 ml/m LA Biplane Vol: 62.1 ml 29.58 ml/m  AORTIC VALVE LVOT Vmax:   108.00 cm/s LVOT Vmean:  69.700 cm/s LVOT VTI:    0.208 m  AORTA Ao Root diam: 3.70 cm Ao Asc diam:  3.70 cm MITRAL VALVE               TRICUSPID VALVE MV Area (PHT): 3.48 cm    TR Peak grad:   21.3 mmHg MV Decel Time: 218 msec    TR Vmax:        231.00 cm/s MV E velocity: 63.40 cm/s MV A velocity: 98.25 cm/s  SHUNTS MV E/A ratio:  0.65        Systemic VTI:  0.21 m                            Systemic Diam: 2.40 cm Rudean Haskell MD Electronically signed by Rudean Haskell MD Signature Date/Time: 01/06/2022/12:02:54 PM    Final    CT ANGIO HEAD NECK W WO CM  Result Date: 01/05/2022 CLINICAL DATA:  Initial evaluation for neuro deficit, stroke suspected. EXAM: CT ANGIOGRAPHY HEAD AND NECK TECHNIQUE: Multidetector CT imaging of the head and neck was performed using the standard protocol during bolus administration of intravenous contrast. Multiplanar CT image reconstructions and MIPs were obtained to evaluate the vascular anatomy. Carotid stenosis measurements (when applicable) are obtained utilizing NASCET criteria, using the distal internal carotid diameter as the denominator. RADIATION DOSE REDUCTION: This exam was performed according to the departmental dose-optimization program which includes automated exposure control, adjustment of the mA and/or kV according to patient size and/or use of iterative reconstruction technique. CONTRAST:  52m OMNIPAQUE IOHEXOL 350 MG/ML SOLN COMPARISON:  Head CT from earlier the same day. FINDINGS: CTA NECK FINDINGS Aortic arch: Visualized aortic arch normal caliber with normal branch pattern. No stenosis about the origin the great vessels. Right carotid system: Right common and internal carotid arteries widely patent without stenosis, dissection or occlusion. Mild atheromatous plaque about the right carotid bulb without stenosis. Left carotid system: Left common and internal carotid arteries widely patent without stenosis, dissection or occlusion. Vertebral arteries: Both vertebral arteries arise from the subclavian arteries. Right vertebral artery dominant, with a diffusely hypoplastic left vertebral artery. Vertebral arteries patent without stenosis or dissection. Skeleton: No discrete or worrisome osseous lesions. Mild cervical spondylosis for age. Other neck: 1.6 cm cystic lesion within the subcutaneous fat of the mid upper posterior neck, indeterminate, but likely a sebaceous cyst (series 5, image 77). Asymmetric fatty atrophy  of the left parotid gland noted. No other acute soft tissue abnormality within the neck. Upper chest: Left-sided pacemaker/AICD partially visualized. Visualized upper chest demonstrates no acute finding. Review of the MIP images confirms the above findings CTA HEAD FINDINGS Anterior circulation: Petrous segments patent bilaterally. Mild atheromatous change within the carotid siphons without significant stenosis. A1 segments, anterior communicating artery complex common anterior cerebral arteries widely patent. No M1 stenosis or occlusion. No proximal MCA branch occlusion. Distal MCA branches perfused and symmetric. Posterior circulation: Dominant right  vertebral artery widely patent. Left vertebral artery largely terminates in PICA. Both PICA patent. Basilar patent to its distal aspect without stenosis. Superior cerebral arteries patent bilaterally. Both PCAs widely patent and well perfused or distal aspects. Venous sinuses: Patent allowing for timing the contrast bolus. Anatomic variants: None significant.  No aneurysm. Review of the MIP images confirms the above findings IMPRESSION: 1. Negative CTA for large vessel occlusion or other emergent finding. 2. Mild for age atheromatous disease without hemodynamically significant or correctable stenosis. 3. 1.6 cm cystic lesion within the subcutaneous fat of the mid upper posterior neck, indeterminate, but likely a sebaceous cyst. Correlation with physical exam suggested. Electronically Signed   By: Jeannine Boga M.D.   On: 01/05/2022 20:32   CT HEAD WO CONTRAST  Result Date: 01/05/2022 CLINICAL DATA:  Onset of RIGHT arm heaviness, tongue heaviness, slurred speech and difficulty thinking at 1700 hours today. Symptoms resolved. Question stroke. History prostate cancer, type II diabetes mellitus, MI, non ischemic cardiomyopathy, essential hypertension EXAM: CT HEAD WITHOUT CONTRAST TECHNIQUE: Contiguous axial images were obtained from the base of the skull  through the vertex without intravenous contrast. RADIATION DOSE REDUCTION: This exam was performed according to the departmental dose-optimization program which includes automated exposure control, adjustment of the mA and/or kV according to patient size and/or use of iterative reconstruction technique. COMPARISON:  02/17/2021 FINDINGS: Brain: Generalized atrophy. Normal ventricular morphology. No midline shift or mass effect. Small vessel chronic ischemic changes of deep cerebral white matter. No intracranial hemorrhage, mass lesion, evidence of acute infarction, or extra-axial fluid collection. Vascular: No hyperdense vessels. Mild atherosclerotic calcification of internal carotid arteries at skull base. Skull: Intact Sinuses/Orbits: Clear Other: N/A IMPRESSION: Atrophy with small vessel chronic ischemic changes of deep cerebral white matter. No acute intracranial abnormalities. Electronically Signed   By: Lavonia Dana M.D.   On: 01/05/2022 18:45   CUP PACEART INCLINIC DEVICE CHECK  Result Date: 01/01/2022 ICD check in clinic. Normal device function. Thresholds and sensing consistent with previous device measurements. Impedance trends stable over time.  No ventricular arrhythmias. Histogram distribution appropriate for patient and level of activity. No changes made this session. Device programmed at appropriate safety margins. Device programmed to optimize intrinsic conduction. Estimated longevity  3 years___. Pt enrolled in remote follow-up  CUP PACEART REMOTE DEVICE CHECK  Result Date: 12/19/2021 Scheduled remote reviewed. Normal device function.  Next remote 91 days. LA  NCV with EMG (electromyography)  Result Date: 12/18/2021 Magnus Sinning, MD     12/21/2021  8:34 PM EMG & NCV Findings: Evaluation of the left tibial motor and the right superficial fibular sensory nerves showed reduced amplitude (L0.3, R2.4 V).  The right tibial motor nerve showed prolonged distal onset latency (6.3 ms) and  reduced amplitude (0.3 mV).  The left superficial fibular sensory nerve showed prolonged distal peak latency (4.8 ms) and decreased conduction velocity (14 cm-Ant Lat Mall, 29 m/s).  The left sural sensory and the right sural sensory nerves showed no response (Calf).  All remaining nerves (as indicated in the following tables) were within normal limits.  Left vs. Right side comparison data for the tibial motor nerve indicates abnormal L-R latency difference (1.8 ms) and abnormal L-R velocity difference (Knee-Ankle, 11 m/s).  Needle evaluation of the left anterior tibialis and the left Fibularis Longus muscles showed increased insertional activity.  All remaining muscles (as indicated in the following table) showed no evidence of electrical instability.  Impression: The above electrodiagnostic study is ABNORMAL and reveals evidence  of mild chronic L5 radiculopathy on the left.  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 There is no significant electrodiagnostic evidence of any other focal nerve entrapment or lumbosacral plexopathy. Recommendations: 1.  Follow-up with referring physician. 2.  Continue current management of symptoms. ___________________________ Laurence Spates FAAPMR Board Certified, American Board of Physical Medicine and Rehabilitation Nerve Conduction Studies Anti Sensory Summary Table  Stim Site NR Peak (ms) Norm Peak (ms) P-T Amp (V) Norm P-T Amp Site1 Site2 Delta-P (ms) Dist (cm) Vel (m/s) Norm Vel (m/s) Left Sup Fibular Anti Sensory (Ant Lat Mall)  29.7C 14 cm    *4.8 <4.4 13.5 >5.0 14 cm Ant Lat Mall 4.8 14.0 *29 >32 Right Sup Fibular Anti Sensory (Ant Lat Mall)  29.7C 14 cm    3.9 <4.4 *2.4 >5.0 14 cm Ant Lat Mall 3.9 14.0 36 >32 Left Sural Anti Sensory (Lat Mall)  29.7C Calf *NR  <4.0  >5.0 Calf Lat Mall  14.0  >35 Right Sural Anti Sensory (Lat Mall)  29.8C Calf *NR  <4.0  >5.0 Calf Lat Mall  14.0  >35 Motor  Summary Table  Stim Site NR Onset (ms) Norm Onset (ms) O-P Amp (mV) Norm O-P Amp Site1 Site2 Delta-0 (ms) Dist (cm) Vel (m/s) Norm Vel (m/s) Right Dp Br Fibular Motor (AntTibialis)  29.7C Fib Head    3.6 <4.2 1.2  Poplit Fib Head 1.9 9.0 47 >40.5 Poplit    5.5 <5.7 0.6        Left Fibular Motor (Ext Dig Brev)  29.2C Ankle    3.0 <6.1 3.2 >2.5 B Fib Ankle 2.0 9.0 45 >38 B Fib    5.0  3.1        Left Tibial Motor (Abd Hall Brev)  29.3C Ankle    4.5 <6.1 *0.3 >3.0 Knee Ankle 9.0 42.0 47 >35 Knee    13.5  4.6        Right Tibial Motor (Abd Hall Brev)  29.9C Ankle    *6.3 <6.1 *0.3 >3.0 Knee Ankle 7.8 45.0 58 >35 Knee    14.1  2.3        EMG  Side Muscle Nerve Root Ins Act Fibs Psw Amp Dur Poly Recrt Int Fraser Din Comment Left AntTibialis Dp Br Peron L4-5 *Incr Nml Nml Nml Nml 0 Nml Nml  Left Fibularis Longus  Sup Br Peron L5-S1 *Incr Nml Nml Nml Nml 0 Nml Nml  Left MedGastroc Tibial S1-2 Nml Nml Nml Nml Nml 0 Nml Nml  Left VastusMed Femoral L2-4 Nml Nml Nml Nml Nml 0 Nml Nml  Left BicepsFemS Sciatic L5-S1 Nml Nml Nml Nml Nml 0 Nml Nml  Right AntTibialis Dp Br Peron L4-5 Nml Nml Nml Nml Nml 0 Nml Nml  Right Fibularis Longus  Sup Br Peron L5-S1 Nml Nml Nml Nml Nml 0 Nml Nml  Right MedGastroc Tibial S1-2 Nml Nml Nml Nml Nml 0 Nml Nml  Right VastusMed Femoral L2-4 Nml Nml Nml Nml Nml 0 Nml Nml  Right BicepsFemS Sciatic L5-S1 Nml Nml Nml Nml Nml 0 Nml Nml  Right GluteusMed SupGluteal L4-S1 Nml Nml Nml Nml Nml 0 Nml Nml  Nerve Conduction Studies Anti Sensory Left/Right Comparison  Stim Site L Lat (ms) R Lat (ms) L-R Lat (ms) L Amp (V) R Amp (V) L-R Amp (%) Site1 Site2 L Vel (m/s) R Vel (m/s) L-R Vel (m/s) Sup Fibular Anti Sensory (Ant Lat Mall)  29.7C 14 cm *4.8  3.9 0.9 13.5 *2.4 82.2 14 cm Ant Lat Mall *29 36 7 Sural Anti Sensory (Lat Mall)  29.7C Calf       Calf Lat Mall    Motor Left/Right Comparison  Stim Site L Lat (ms) R Lat (ms) L-R Lat (ms) L Amp (mV) R Amp (mV) L-R Amp (%) Site1 Site2 L Vel (m/s) R Vel (m/s) L-R  Vel (m/s) Dp Br Fibular Motor (AntTibialis)  29.7C Fib Head  3.6   1.2  Poplit Fib Head  47  Poplit  5.5   0.6       Fibular Motor (Ext Dig Brev)  29.2C Ankle 3.0   3.2   B Fib Ankle 45   B Fib 5.0   3.1        Tibial Motor (Abd Hall Brev)  29.3C Ankle 4.5 *6.3 *1.8 *0.3 *0.3 0.0 Knee Ankle 47 58 *11 Knee 13.5 14.1 0.6 4.6 2.3 50.0      Waveforms:            Microbiology: Results for orders placed or performed during the hospital encounter of 01/05/22  Resp Panel by RT-PCR (Flu A&B, Covid) Anterior Nasal Swab     Status: None   Collection Time: 01/05/22  7:53 PM   Specimen: Anterior Nasal Swab  Result Value Ref Range Status   SARS Coronavirus 2 by RT PCR NEGATIVE NEGATIVE Final    Comment: (NOTE) SARS-CoV-2 target nucleic acids are NOT DETECTED.  The SARS-CoV-2 RNA is generally detectable in upper respiratory specimens during the acute phase of infection. The lowest concentration of SARS-CoV-2 viral copies this assay can detect is 138 copies/mL. A negative result does not preclude SARS-Cov-2 infection and should not be used as the sole basis for treatment or other patient management decisions. A negative result may occur with  improper specimen collection/handling, submission of specimen other than nasopharyngeal swab, presence of viral mutation(s) within the areas targeted by this assay, and inadequate number of viral copies(<138 copies/mL). A negative result must be combined with clinical observations, patient history, and epidemiological information. The expected result is Negative.  Fact Sheet for Patients:  EntrepreneurPulse.com.au  Fact Sheet for Healthcare Providers:  IncredibleEmployment.be  This test is no t yet approved or cleared by the [place] FDA and  has been authorized for detection and/or diagnosis of SARS-CoV-2 by FDA under an Emergency Use Authorization (EUA). This EUA will remain  in effect (meaning this test can be  used) for the duration of the COVID-19 declaration under Section 564(b)(1) of the Act, 21 U.S.C.section 360bbb-3(b)(1), unless the authorization is terminated  or revoked sooner.       Influenza A by PCR NEGATIVE NEGATIVE Final   Influenza B by PCR NEGATIVE NEGATIVE Final    Comment: (NOTE) The Xpert Xpress SARS-CoV-2/FLU/RSV plus assay is intended as an aid in the diagnosis of influenza from Nasopharyngeal swab specimens and should not be used as a sole basis for treatment. Nasal washings and aspirates are unacceptable for Xpert Xpress SARS-CoV-2/FLU/RSV testing.  Fact Sheet for Patients: EntrepreneurPulse.com.au  Fact Sheet for Healthcare Providers: IncredibleEmployment.be  This test is not yet approved or cleared by the [place] FDA and has been authorized for detection and/or diagnosis of SARS-CoV-2 by FDA under an Emergency Use Authorization (EUA). This EUA will remain in effect (meaning this test can be used) for the duration of the COVID-19 declaration under Section 564(b)(1) of the Act, 21 U.S.C. section 360bbb-3(b)(1), unless the authorization is terminated or revoked.  Performed  at [facility], [address] [place], [place] [address]     Labs: CBC: Recent Labs  Lab 01/05/22 1810 01/06/22 0536  WBC 7.1 5.8  NEUTROABS 4.0  --   HGB 12.0* 12.3*  HCT 38.8* 38.2*  MCV 74.0* 72.6*  PLT 226 322   Basic Metabolic Panel: Recent Labs  Lab 01/05/22 1810 01/06/22 0536  NA 139 140  K 4.2 3.5  CL 107 105  CO2 28 28  GLUCOSE 102* 93  BUN 11 8  CREATININE 0.93 0.87  CALCIUM 9.0 9.4   Liver Function Tests: Recent Labs  Lab 01/05/22 1810 01/06/22 0536  AST 19 16  ALT 20 20  ALKPHOS 115 118  BILITOT 0.7 0.9  PROT 7.8 7.2  ALBUMIN 4.2 3.8   CBG: Recent Labs  Lab 01/06/22 1155 01/06/22 1639 01/06/22 2133 01/07/22 0621 01/07/22 1233  GLUCAP 181* 123* 157* 109* 163*    Discharge time spent:  greater than 30 minutes.  Signed: Oswald Hillock, MD [facility] 01/07/2022

## 2022-01-07 NOTE — Progress Notes (Addendum)
Physical Therapy Treatment Patient Details Name: [name] MRN: [id number] DOB: 1950/07/23 Today's Date: 01/07/2022   History of Present Illness [name] is a 71 y.o. male admitted with 15 min episode of difficulty talking and R UE weakness. Wife present, resolved in 15 min. Scans neg, suspect TIA PMH: chronic systolic CHF status post AICD placement, hypertension, diabetes mellitus, anemia, per wife pt started on aricept in january 2023 for memory deficits.    PT Comments    Pt progressing towards mobility goals however remains to have cognitive deficits. Aware cognitive deficts have been progressive since January. Suspect pt is at baseline regarding cognition. Instructed pt and spouse to have pt ambulate outside and long distances with RW to improve stability and decrease risk of falling. Suspect using RW in home would be cumbersome for pt as he demo's decreased safety awareness and in ability to safely use RW during turning and around obstacles posing increased chance of tripping on RW and falling. Spouse agrees. Acute PT to cont to follow.   Recommendations for follow up therapy are one component of a multi-disciplinary discharge planning process, led by the attending physician.  Recommendations may be updated based on patient status, additional functional criteria and insurance authorization.  Follow Up Recommendations  Home health PT     Assistance Recommended at Discharge Frequent or constant Supervision/Assistance  Patient can return home with the following A little help with walking and/or transfers;Direct supervision/assist for medications management;Direct supervision/assist for financial management;Assist for transportation   Equipment Recommendations  BSC/3in1;Rolling walker (2 wheels)    Recommendations for Other Services       Precautions / Restrictions Precautions Precautions: Fall Precaution Comments: impaired cognition, poor memory Restrictions Weight  Bearing Restrictions: No     Mobility  Bed Mobility               General bed mobility comments: pt up in chair upon PT arrival    Transfers Overall transfer level: Needs assistance Equipment used: None Transfers: Sit to/from Stand Sit to Stand: Min guard           General transfer comment: wide base of support, quick to move, min guard for safety    Ambulation/Gait Ambulation/Gait assistance: Min assist Gait Distance (Feet): 200 Feet Assistive device: None, Rolling walker (2 wheels) Gait Pattern/deviations: Step-through pattern, Decreased stride length, Wide base of support Gait velocity: dec Gait velocity interpretation: 1.31 - 2.62 ft/sec, indicative of limited community ambulator   General Gait Details: pt with noted L LE instability and buckling when amb without RW within the first 4' of ambulation. pt given RW and pt demonstrated increased step height, length, and stability. Pt with improved walker management compared to yesterday however continues to have difficulty with turning and keeping both LEs in walker   Stairs Stairs: Yes Stairs assistance: Min guard Stair Management: Two rails, One rail Left, One rail Right, Alternating pattern, Step to pattern, Forwards Number of Stairs: 2 (x6 trials) General stair comments: pt with bilat HR on step entering home, pt with good reciprocal stepping pattern with bilat HR. On stairs up to second floor there is a railing on the R 1/2 way up and then it switches to the L. Pt required to complete step to gait pattern when only using 1 hand rail, pt did on own without cues   Wheelchair Mobility    Modified Rankin (Stroke Patients Only) Modified Rankin (Stroke Patients Only) Pre-Morbid Rankin Score: Slight disability Modified Rankin: Moderate disability  Balance Overall balance assessment: Needs assistance Sitting-balance support: Feet supported, Bilateral upper extremity supported Sitting balance-Leahy Scale:  Fair Sitting balance - Comments: reaching toward feet while sitting EOB   Standing balance support: During functional activity, No upper extremity supported Standing balance-Leahy Scale: Fair Standing balance comment: with onset of fatigue pt with further impaired balance, benefits from UE support                            Cognition Arousal/Alertness: Awake/alert Behavior During Therapy: WFL for tasks assessed/performed Overall Cognitive Status: Impaired/Different from baseline Area of Impairment: Orientation, Attention, Memory, Following commands, Safety/judgement, Awareness, Problem solving                 Orientation Level: Disoriented to, Time, Situation Current Attention Level: Focused Memory: Decreased short-term memory, Decreased recall of precautions Following Commands: Follows one step commands with increased time, Follows one step commands inconsistently Safety/Judgement: Decreased awareness of safety, Decreased awareness of deficits Awareness: Intellectual Problem Solving: Slow processing, Decreased initiation, Difficulty sequencing, Requires verbal cues, Requires tactile cues General Comments: pt pleasant despite note in chart stating pt was agitated over night. Pt followed simple commands however poor carry over with walker management techniques and safe mangement, remains unable to navigate hallways ie. finding his room, required significant cues        Exercises      General Comments General comments (skin integrity, edema, etc.): VSS      Pertinent Vitals/Pain Pain Assessment Pain Assessment: No/denies pain    Home Living                          Prior Function            PT Goals (current goals can now be found in the care plan section) Acute Rehab PT Goals Patient Stated Goal: home PT Goal Formulation: With patient/family Time For Goal Achievement: 01/20/22 Potential to Achieve Goals: Fair Progress towards PT goals:  Progressing toward goals    Frequency    Min 3X/week      PT Plan Current plan remains appropriate    Co-evaluation              AM-PAC PT "6 Clicks" Mobility   Outcome Measure  Help needed turning from your back to your side while in a flat bed without using bedrails?: None Help needed moving from lying on your back to sitting on the side of a flat bed without using bedrails?: None Help needed moving to and from a bed to a chair (including a wheelchair)?: A Little Help needed standing up from a chair using your arms (e.g., wheelchair or bedside chair)?: A Little Help needed to walk in hospital room?: A Little Help needed climbing 3-5 steps with a railing? : A Lot 6 Click Score: 19    End of Session Equipment Utilized During Treatment: Gait belt Activity Tolerance: Patient tolerated treatment well Patient left: in chair;with call bell/phone within reach;with chair alarm set;with family/visitor present Nurse Communication: Mobility status (impaired cognition) PT Visit Diagnosis: Unsteadiness on feet (R26.81);Difficulty in walking, not elsewhere classified (R26.2)     Time: 1884-1660 PT Time Calculation (min) (ACUTE ONLY): 17 min  Charges:  $Gait Training: 8-22 mins                     Kittie Plater, PT, DPT Acute Rehabilitation Services Secure chat preferred Office #: [phone]  Sohan Potvin M Bernadetta Roell 01/07/2022, 10:25 AM

## 2022-01-08 ENCOUNTER — Telehealth: Payer: Self-pay

## 2022-01-08 NOTE — Telephone Encounter (Signed)
Transition Care Management Follow-up Telephone Call Date of discharge and from where: [facility] 01-07-22 Dx: TIA How have you been since you were released from the hospital? Doing ok  Any questions or concerns? No  Items Reviewed: Did the pt receive and understand the discharge instructions provided? Yes  Medications obtained and verified? Yes  Other? No  Any new allergies since your discharge? No  Dietary orders reviewed? Yes Do you have support at home? Yes   Home Care and Equipment/Supplies: Were home health services ordered? Yes- PT/OT If so, what is the name of the agency? [facility]   Has the agency set up a time to come to the patient's home? yes Were any new equipment or medical supplies ordered?  Yes: Rolling walker  What is the [facility] of the medical supply agency? [facility]  Were you able to get the supplies/equipment? yes Do you have any questions related to the use of the equipment or supplies? No  Functional Questionnaire: (I = Independent and D = Dependent) ADLs: I  Bathing/Dressing- I  Meal Prep- I  Eating- I  Maintaining continence- I  Transferring/Ambulation- I  Managing Meds- D  Follow up appointments reviewed:  PCP Hospital f/u appt confirmed? Yes  Scheduled to see Dr Quay Burow  on 01-14-22 @ [facility] f/u appt confirmed? Yes  Scheduled to see Dr Durward Fortes on 01-14-22 @ 9am. Are transportation arrangements needed? No  If their condition worsens, is the pt aware to call PCP or go to the Emergency Dept.? Yes Was the patient provided with contact information for the PCP's office or ED? Yes Was to pt encouraged to call back with questions or concerns? Yes

## 2022-01-09 ENCOUNTER — Telehealth: Payer: Self-pay | Admitting: Internal Medicine

## 2022-01-09 NOTE — Telephone Encounter (Signed)
Therapist with [facility] needs verbal order for physical therapy 1 week x 1 then 2 x 4 weeks.  Balance, strenthening, walking training  Level 2 drug interaction between Sopalol and Aricept

## 2022-01-09 NOTE — Telephone Encounter (Signed)
Okay for orders? 

## 2022-01-13 NOTE — Progress Notes (Unsigned)
Subjective:    Patient ID: [name], male    DOB: 1950-07-02, 71 y.o.   MRN: [id number]     HPI Moss is here for follow up from the hospital  Admitted 8/28 - 8/30 for TIA  Went to ED 8/28 after brief episode of difficulty talking and mild weakness of the RUE at 5 pm the day prior.  He was with his wfie.  He was unable to bring out his worsd and later had slurred speech. All symptoms reolsved within 15 minutes.    ED - Ct head and then CtA of head and neck showed no large vessel obstruction or any acute changes.  Neuology on call consulted - started on ASA, plavix after passing stroke swallow evaluation.   TIA: CtA head and neck and Ct of head showed no large vessel obstruction or any acute changes TTE EF 35-40% Started on ASA 81 mg daily and plavix 75 mg daily x 3 weeks On atorvastatin 20 mg dialy - LDL at goal < 70 Will need outpatient 30 day event heart monitor Cardiology consulted and will arrange outpt monitor PT/OT recommended home PT/OT  Type 2 DM: A1c 6.2% Continued home regimen  Hypertension: Continued on coreg, losartan  AICD: Not MRI compatible - so MRI was not done  HFrEF: With an AICD  -  h/o Vtach on sotalol and dig Continue current digoxin dose - level checked Following with EP as outpatient  OSA: On cpap nightly  Malignant neoplasm of prostate  Medications and allergies reviewed with patient and updated if appropriate.  Current Outpatient Medications on File Prior to Visit  Medication Sig Dispense Refill   ACCU-CHEK GUIDE test strip USE TO CHECK SUGAR 4 TIMES A DAY 100 strip 4   acetaminophen (TYLENOL) 500 MG tablet Take 1,000 mg by mouth every 6 (six) hours as needed for mild pain.     aspirin 81 MG chewable tablet Chew 1 tablet (81 mg total) by mouth daily for 21 days. Take aspirin and Plavix for 3 weeks, then stop taking aspirin after 3 weeks and continue with Plavix 21 tablet 0   atorvastatin (LIPITOR) 20 MG tablet TAKE 1 TABLET  BY MOUTH DAILY EXCEPT 1/2 TABLET ON TUESDAY, THURSDAY AND SATURDAY. (Patient taking differently: Take 10-20 mg by mouth See admin instructions. Take 1 tablet ( 20 mg) by mouth daily except 1/2 tablet ( 10 mg) on Tuesday, Thursday and Saturday.) 24 tablet 8   carvedilol (COREG) 25 MG tablet TAKE 1 TABLET TWICE DAILY WITH MEALS 180 tablet 1   Cholecalciferol (VITAMIN D) 50 MCG (2000 UT) tablet Take 2,000 Units by mouth daily.     clopidogrel (PLAVIX) 75 MG tablet Take 1 tablet (75 mg total) by mouth daily. 30 tablet 3   digoxin (LANOXIN) 0.125 MG tablet Take 0.5 tablets (0.0625 mg total) by mouth daily. 45 tablet 3   docusate sodium (COLACE) 100 MG capsule Take 1 capsule (100 mg total) by mouth 2 (two) times daily as needed for mild constipation. 60 capsule 2   donepezil (ARICEPT) 10 MG tablet TAKE HALF TABLET (5 MG) DAILY FOR 2 WEEKS, THEN INCREASE TO THE FULL TABLET AT 10 MG DAILY (Patient taking differently: Take 10 mg by mouth daily.) 90 tablet 1   famotidine (PEPCID) 20 MG tablet Take 1 tablet (20 mg total) by mouth daily. 90 tablet 3   gabapentin (NEURONTIN) 100 MG capsule Take 1 capsule (100 mg total) by mouth 2 (two) times daily.  60 capsule 2   Lancets (ONETOUCH ULTRASOFT) lancets Use as instructed to test sugar daily and prn. Dx E11.9 100 each 12   losartan (COZAAR) 25 MG tablet Take 1 tablet (25 mg total) by mouth at bedtime. 90 tablet 2   pantoprazole (PROTONIX) 40 MG tablet Take 1 tablet (40 mg total) by mouth daily. 30 tablet 2   sitaGLIPtin-metformin (JANUMET) 50-1000 MG tablet Take 1 tablet by mouth daily. Take with a meal 30 tablet 8   sotalol (BETAPACE) 120 MG tablet TAKE 1 TABLET TWICE DAILY (Patient taking differently: Take 120 mg by mouth 2 (two) times daily.) 120 tablet 2   traMADol (ULTRAM) 50 MG tablet Take 1 tablet (50 mg total) by mouth every 6 (six) hours as needed. (Patient taking differently: Take 50 mg by mouth every 6 (six) hours as needed for moderate pain.) 30 tablet 0    vitamin B-12 (CYANOCOBALAMIN) 1000 MCG tablet TAKE 1 TABLET EVERY DAY 90 tablet 1   No current facility-administered medications on file prior to visit.     Review of Systems     Objective:  There were no vitals filed for this visit. BP Readings from Last 3 Encounters:  01/07/22 135/75  01/01/22 108/68  12/19/21 (!) 150/84   Wt Readings from Last 3 Encounters:  01/05/22 198 lb (89.8 kg)  01/01/22 189 lb (85.7 kg)  12/19/21 189 lb (85.7 kg)   There is no height or weight on file to calculate BMI.    Physical Exam     Lab Results  Component Value Date   WBC 5.8 01/06/2022   HGB 12.3 (L) 01/06/2022   HCT 38.2 (L) 01/06/2022   PLT 218 01/06/2022   GLUCOSE 93 01/06/2022   CHOL 109 01/06/2022   TRIG 42 01/06/2022   HDL 37 (L) 01/06/2022   LDLCALC 64 01/06/2022   ALT 20 01/06/2022   AST 16 01/06/2022   NA 140 01/06/2022   K 3.5 01/06/2022   CL 105 01/06/2022   CREATININE 0.87 01/06/2022   BUN 8 01/06/2022   CO2 28 01/06/2022   TSH 3.31 08/04/2021   PSA 0.00 Repeated and verified X2. (L) 11/14/2020   INR 1.1 01/05/2022   HGBA1C 6.2 (H) 01/06/2022   MICROALBUR 1.7 08/04/2021   CT HEAD WO CONTRAST (5MM) CLINICAL DATA:  Provided history: Stroke, follow-up.  EXAM: CT HEAD WITHOUT CONTRAST  TECHNIQUE: Contiguous axial images were obtained from the base of the skull through the vertex without intravenous contrast.  RADIATION DOSE REDUCTION: This exam was performed according to the departmental dose-optimization program which includes automated exposure control, adjustment of the mA and/or kV according to patient size and/or use of iterative reconstruction technique.  COMPARISON:  Non-contrast head CT and CT angiogram head/neck 01/05/2022.  FINDINGS: Streak/beam hardening artifact arising from dental restoration partially obscures the posterior fossa.  Brain:  No age advanced or lobar predominant parenchymal atrophy.  Mild patchy and ill-defined  hypoattenuation within the cerebral white matter, nonspecific but compatible with chronic small vessel ischemic disease.  There is no acute intracranial hemorrhage.  No demarcated cortical infarct.  No extra-axial fluid collection.  No evidence of an intracranial mass.  No midline shift.  Vascular: No hyperdense vessel. Atherosclerotic calcifications.  Skull: No fracture or aggressive osseous lesion.  Sinuses/Orbits: No mass or acute finding within the imaged orbits. Trace scattered paranasal sinus mucosal thickening.  IMPRESSION: Streak/beam hardening artifact partially obscures the posterior fossa.  Within this limitation, no evidence of acute intracranial abnormality.  Mild chronic small vessel ischemic changes within the cerebral white matter.  Electronically Signed   By: Kellie Simmering D.O.   On: 01/06/2022 21:06 ECHOCARDIOGRAM COMPLETE    ECHOCARDIOGRAM REPORT       Patient Name:   EZRIEL BOFFA Date of Exam: 01/06/2022 Medical Rec #:  [id number]      Height:       71.0 in Accession #:    [id number]     Weight:       198.0 lb Date of Birth:  05/19/1950      BSA:          2.100 m Patient Age:    89 years       BP:           175/112 mmHg Patient Gender: M              HR:           70 bpm. Exam Location:  Inpatient  Procedure: 2D Echo, 3D Echo, Cardiac Doppler and Color Doppler  Indications:    Stroke   History:        Patient has prior history of Echocardiogram examinations, most                 recent 05/10/2019. Cardiomyopathy, TIA; Risk                 Factors:Dyslipidemia, Diabetes and Hypertension.   Sonographer:    Roseanna Rainbow RDCS Referring Phys: Alferd Patee [facility]  IMPRESSIONS   1. Left ventricular ejection fraction, by estimation, is 35 to 40%. The left ventricle has moderately decreased function. The left ventricle demonstrates global hypokinesis. Left ventricular diastolic parameters are consistent with Grade I diastolic  dysfunction (impaired  relaxation).  2. Right ventricular systolic function is normal. The right ventricular size is normal. There is normal pulmonary artery systolic pressure.  3. The mitral valve is normal in structure. No evidence of mitral valve regurgitation.  4. The aortic valve was not well visualized. Aortic valve regurgitation is not visualized.  5. The inferior vena cava is normal in size with greater than 50% respiratory variability, suggesting right atrial pressure of 3 mmHg.  Comparison(s): No significant change from prior study.  FINDINGS  Left Ventricle: Left ventricular ejection fraction, by estimation, is 35 to 40%. The left ventricle has moderately decreased function. The left ventricle demonstrates global hypokinesis. The left ventricular internal cavity size was normal in size.  There is no left ventricular hypertrophy. Left ventricular diastolic parameters are consistent with Grade I diastolic dysfunction (impaired relaxation).  Right Ventricle: The right ventricular size is normal. No increase in right ventricular wall thickness. Right ventricular systolic function is normal. There is normal pulmonary artery systolic pressure. The tricuspid regurgitant velocity is 2.31 m/s, and  with an assumed right atrial pressure of 3 mmHg, the estimated right ventricular systolic pressure is 38.4 mmHg.  Left Atrium: Left atrial size was normal in size.  Right Atrium: Right atrial size was normal in size.  Pericardium: There is no evidence of pericardial effusion.  Mitral Valve: The mitral valve is normal in structure. No evidence of mitral valve regurgitation.  Tricuspid Valve: The tricuspid valve is normal in structure. Tricuspid valve regurgitation is not demonstrated. No evidence of tricuspid stenosis.  Aortic Valve: The aortic valve was not well visualized. Aortic valve regurgitation is not visualized.  Pulmonic Valve: The pulmonic valve was normal in structure. Pulmonic valve regurgitation is not  visualized. No evidence of pulmonic  stenosis.  Aorta: The aortic root, ascending aorta and aortic arch are all structurally normal, with no evidence of dilitation or obstruction.  Venous: The inferior vena cava is normal in size with greater than 50% respiratory variability, suggesting right atrial pressure of 3 mmHg.  IAS/Shunts: No atrial level shunt detected by color flow Doppler.  Additional Comments: A device lead is visualized in the right ventricle and right atrium.    LEFT VENTRICLE PLAX 2D LVIDd:         5.20 cm      Diastology LVIDs:         4.60 cm      LV e' medial:    6.31 cm/s LV PW:         1.50 cm      LV E/e' medial:  10.0 LV IVS:        0.80 cm      LV e' lateral:   9.14 cm/s LVOT diam:     2.40 cm      LV E/e' lateral: 6.9 LV SV:         94 LV SV Index:   45 LVOT Area:     4.52 cm   LV Volumes (MOD) LV vol d, MOD A2C: 140.0 ml LV vol d, MOD A4C: 171.0 ml LV vol s, MOD A2C: 89.4 ml LV vol s, MOD A4C: 102.0 ml LV SV MOD A2C:     50.7 ml LV SV MOD A4C:     171.0 ml LV SV MOD BP:      59.1 ml  RIGHT VENTRICLE             IVC RV S prime:     15.30 cm/s  IVC diam: 1.80 cm TAPSE (M-mode): 1.6 cm  LEFT ATRIUM             Index        RIGHT ATRIUM           Index LA diam:        4.00 cm 1.91 cm/m   RA Area:     15.30 cm LA Vol (A2C):   71.2 ml 33.91 ml/m  RA Volume:   38.20 ml  18.19 ml/m LA Vol (A4C):   51.1 ml 24.34 ml/m LA Biplane Vol: 62.1 ml 29.58 ml/m  AORTIC VALVE LVOT Vmax:   108.00 cm/s LVOT Vmean:  69.700 cm/s LVOT VTI:    0.208 m   AORTA Ao Root diam: 3.70 cm Ao Asc diam:  3.70 cm  MITRAL VALVE               TRICUSPID VALVE MV Area (PHT): 3.48 cm    TR Peak grad:   21.3 mmHg MV Decel Time: 218 msec    TR Vmax:        231.00 cm/s MV E velocity: 63.40 cm/s MV A velocity: 98.25 cm/s  SHUNTS MV E/A ratio:  0.65        Systemic VTI:  0.21 m                            Systemic Diam: 2.40 cm  Rudean Haskell MD Electronically signed  by Rudean Haskell MD Signature Date/Time: 01/06/2022/12:02:54 PM      Final      Assessment & Plan:    See Problem List for Assessment and Plan of chronic medical problems.

## 2022-01-13 NOTE — Telephone Encounter (Signed)
Verbals given today. °

## 2022-01-14 ENCOUNTER — Other Ambulatory Visit: Payer: Self-pay

## 2022-01-14 ENCOUNTER — Encounter: Payer: Self-pay | Admitting: Internal Medicine

## 2022-01-14 ENCOUNTER — Encounter: Payer: Self-pay | Admitting: Orthopaedic Surgery

## 2022-01-14 ENCOUNTER — Ambulatory Visit: Payer: Medicare PPO | Admitting: Orthopaedic Surgery

## 2022-01-14 ENCOUNTER — Ambulatory Visit: Payer: Medicare PPO | Admitting: Internal Medicine

## 2022-01-14 VITALS — BP 136/70 | HR 63 | Temp 97.9°F | Ht 71.0 in | Wt 190.6 lb

## 2022-01-14 DIAGNOSIS — F0670 Mild neurocognitive disorder due to known physiological condition without behavioral disturbance: Secondary | ICD-10-CM

## 2022-01-14 DIAGNOSIS — E1151 Type 2 diabetes mellitus with diabetic peripheral angiopathy without gangrene: Secondary | ICD-10-CM | POA: Diagnosis not present

## 2022-01-14 DIAGNOSIS — I999 Unspecified disorder of circulatory system: Secondary | ICD-10-CM

## 2022-01-14 DIAGNOSIS — I5022 Chronic systolic (congestive) heart failure: Secondary | ICD-10-CM | POA: Diagnosis not present

## 2022-01-14 DIAGNOSIS — E1140 Type 2 diabetes mellitus with diabetic neuropathy, unspecified: Secondary | ICD-10-CM

## 2022-01-14 DIAGNOSIS — E0842 Diabetes mellitus due to underlying condition with diabetic polyneuropathy: Secondary | ICD-10-CM

## 2022-01-14 DIAGNOSIS — L602 Onychogryphosis: Secondary | ICD-10-CM | POA: Diagnosis not present

## 2022-01-14 DIAGNOSIS — E1142 Type 2 diabetes mellitus with diabetic polyneuropathy: Secondary | ICD-10-CM | POA: Diagnosis not present

## 2022-01-14 DIAGNOSIS — E7849 Other hyperlipidemia: Secondary | ICD-10-CM | POA: Diagnosis not present

## 2022-01-14 DIAGNOSIS — G459 Transient cerebral ischemic attack, unspecified: Secondary | ICD-10-CM

## 2022-01-14 DIAGNOSIS — I10 Essential (primary) hypertension: Secondary | ICD-10-CM

## 2022-01-14 HISTORY — DX: Type 2 diabetes mellitus with diabetic neuropathy, unspecified: E11.40

## 2022-01-14 NOTE — Assessment & Plan Note (Signed)
Chronic Sugars have been well controlled-last A1c was 6.2% Continue Janumet 50-1000 once daily Continue diabetic diet Encouraged regular exercise

## 2022-01-14 NOTE — Assessment & Plan Note (Signed)
Chronic LDL at goal Continue atorvastatin 20 mg daily 4 days a week, 10 mg 3 days a week

## 2022-01-14 NOTE — Assessment & Plan Note (Signed)
Chronic Following with neurology-Sara Wertman Continue donepezil 10 mg nightly And his wife are anxious to be more aggressive with treatment and were hoping to get a more solid diagnosis Discussed second opinion-encouraged them to discuss with Clarise Cruz

## 2022-01-14 NOTE — Assessment & Plan Note (Signed)
Chronic Previous EMG did show probable diabetic neuropathy Orthopedics would like him to see neurology again and will refer him for further evaluation He does have slightly decreased sensation in his feet bilaterally and has hypertrophic nails Advised checking his feet daily Advised not going barefoot We will refer to podiatry for diabetic foot exam and help with hypertrophic nails

## 2022-01-14 NOTE — Assessment & Plan Note (Signed)
Recent symptoms suggestive of TIA No recurrence of symptoms CT head, CTA head and neck showed no major large vessel occlusion or acute findings TTE EF 35-40% Just received his 30-day Holter monitor-we will try to get that on later today Continue atorvastatin 20 mg daily-LDL is at goal of less than 70 Currently taking aspirin 81 mg daily and Plavix 75 mg daily-after 3 weeks he was instructed to stop the aspirin and continue Plavix He will schedule follow-up with cardiology for after the Holter monitor-encouraged him to call to set up that appointment now and if they have any questions regarding Holter monitor to call the cardiac nurse

## 2022-01-14 NOTE — Progress Notes (Signed)
Office Visit Note   Patient: [name]           Date of Birth: [birth year]           MRN: [id number] Visit Date: 01/14/2022              Requested by: Binnie Rail, MD [place],  [place] [id number] PCP: Binnie Rail, MD   Assessment & Plan: Visit Diagnoses:  1. Diabetic polyneuropathy associated with diabetes mellitus due to underlying condition [facility])     Plan: Mr. Minix was last seen in June for evaluation of back pain associated with bilateral lower extremity weakness.  X-rays of his lumbar and thoracic spine demonstrated significant degenerative changes.  A CT scan of the lumbar spine was obtained as he has a cardiac pacemaker and cannot have an MRI scan.  This demonstrated multilevel facet arthropathy which causes mild mild neural foraminal narrowing bilaterally at L2-3, L3-4 and L4-5 as well as on the right at L1-2 and L5 1.  There was no canal stenosis.  Narrowing of the lateral recesses at L3-4 and L4-5 could affect the descending L4 and L5 nerve roots respectively.  I had Dr. Ernestina Patches performed EMGs nerve conduction velocities of both lower extremities.  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  He is diabetic and I would assume that the neuropathy is based on the diabetes.  He continues to have some trouble with his back which is controlled with Tylenol.  He is taking Plavix for his atrial fibrillation.  I would like to try a course of physical therapy.  In the interim he has had a stroke and is receiving home health therapy through [facility] and will contact them to see if they can perform some thoracic and lumbar exercises.  I would also like him to return to the neurologist to evaluate his neuropathy.  I did place him on gabapentin and he notes that this had helped him sleep and he is feeling a little bit better but I like them to follow-up  on that.  All questions were answered.  Follow-Up Instructions: Return if symptoms worsen or fail to improve.   Orders:  No orders of the defined types were placed in this encounter.  No orders of the defined types were placed in this encounter.     Procedures: No procedures performed   Clinical Data: No additional findings.   Subjective: Chief Complaint  Patient presents with   Lower Back - Follow-up   Patient presents today for follow up of his lower back pain. He states that he had recently had a mini stroke on 01/05/2022, and is scheduled to follow up with his primary care physician 01/14/2022. He states that with his lower back pain has been staying consistent. At this time he is able to take two over the counter tylenol for his pain which has been helping.    Review of Systems   Objective: Vital Signs: There were no vitals taken for this visit.  Physical Exam Constitutional:      Appearance: He is well-developed.  Pulmonary:     Effort: Pulmonary effort is normal.  Skin:    General: Skin is warm and dry.  Neurological:     Mental Status: He is alert and oriented to person, place, and time.  Psychiatric:        Behavior: Behavior normal.  Ortho Exam awake alert and oriented x3.  Comfortable sitting..  Oriented to person place and time and appropriate.  Straight leg raise is negative.  Very minimal percussible tenderness of the lumbar spine but not the thoracic spine.  Had +1 pulses there was a very mild change in temperature of his lower extremities as we progressed distally about the ankle and the foot but he notes he has good sensibility.  Painless range of motion both hips  Specialty Comments:  No specialty comments available.  Imaging: No results found.   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   History of COVID-19    Aortic atherosclerosis 12/05/2020   Fatigue 12/02/2020   Neuralgia 10/19/2018   Shingles 07/28/2016   GERD (gastroesophageal reflux disease) 07/29/2015   Malignant neoplasm of prostate ([place]) 09/27/2014   RLS (restless legs syndrome) 11/29/2012   OSA (obstructive sleep apnea) 11/10/2012   Vitamin B 12 deficiency 86/57/8469   Chronic systolic heart failure 62/95/2841   Mild anemia 01/20/2011   Ventricular tachycardia (HCC)    NICM (nonischemic cardiomyopathy) ([place])    Type II diabetes mellitus 08/26/2009   Hyperlipidemia 08/26/2009   Essential hypertension 08/26/2009   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   Barrett's esophagus 09/05/2004   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   Not compatible with MRI    Barrett's esophagus 09/05/2004   2 cm changes seen at index EGD   Cataract    Chronic back pain 32/44/0102   Chronic systolic heart failure    Colon polyps    tubular adenoma   Dysrhythmia    Essential hypertension 08/26/2009   Fatigue 12/02/2020   Generalized anxiety disorder    since defibrillator placement   GERD (gastroesophageal reflux disease)    Hip pain 12/14/2016   History of COVID-19    History of myocardial infarction    Hyperlipidemia    Left knee pain 12/14/2016   Malignant neoplasm of prostate 09/27/2014   s/p prostatectomy   Mild anemia 01/20/2011   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 b. s/p Medtronic ICD.   Obstructive sleep apnea 11/10/2012   no CPAP use    Right foot pain 12/15/2017   RLS (restless legs syndrome) 11/29/2012   S/P radiation therapy    10/10/2014 through 11/26/2014; Prostate bed 6600 cGy in 33 sessions   Shingles 07/28/2016   Type II diabetes mellitus 08/26/2009   Ventricular tachycardia ([place])    a. s/p ICD (generator change 09/2012). b. 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 and placed on sotalol   Vitamin B 12 deficiency 03/06/2011   Monthly B12 shots initiated  2011. Nasally  inhaled B12 as of 07/2012    Family History  Problem Relation Age of Onset   Dementia Mother    Hypertension Mother    Coronary artery disease Father    Prostate cancer Father    Diabetes Father    Dementia Sister    Dementia Brother  Diabetes Paternal Grandmother    Dementia Maternal Aunt    Stroke Neg Hx    Colon cancer Neg Hx    Colon polyps Neg Hx    Esophageal cancer Neg Hx    Rectal cancer Neg Hx    Stomach cancer Neg Hx     Past Surgical History:  Procedure Laterality Date   CARDIAC DEFIBRILLATOR PLACEMENT  03/11/2004   Medtronic Maximo single lead   COLONOSCOPY W/ BIOPSIES AND POLYPECTOMY  08/2004, 02/24/11   13m adenoma in 2006,  5 mm polyp (not recovered) 2012   [place] 10[place]   Procedure: CYSTOSCOPY WITH DIRECT VISION INTERNAL URETHROTOMY;  Surgeon: BNickie Retort MD;  Location: [facility];  Service: Urology;  Laterality: N/A;   IMPLANTABLE CARDIOVERTER DEFIBRILLATOR GENERATOR CHANGE N/A 10/07/2012   Procedure: IMPLANTABLE CARDIOVERTER DEFIBRILLATOR GENERATOR CHANGE;  Surgeon: GEvans Lance MD;  [facility];  Service: Cardiovascular;  Laterality: N/A;   PROSTATECTOMY  06/2003   Dr NJanice Norrie  RECTAL SURGERY     UPPER GASTROINTESTINAL ENDOSCOPY  08/2004, 02/24/11   Barrett's esophagus   Social History   Occupational History   Occupation: BTherapist, [employer]  Tobacco Use   Smoking status: Never   Smokeless tobacco: Never  Vaping Use   Vaping Use: Never used   Substance and Sexual Activity   Alcohol use: No   Drug use: No   Sexual activity: Not on file

## 2022-01-14 NOTE — Addendum Note (Signed)
Addended by: Binnie Rail on: 01/14/2022 05:56 PM   Modules accepted: Level of Service

## 2022-01-14 NOTE — Patient Instructions (Addendum)
     Medications changes include :   none     A referral was ordered for podiatry.     Someone from that office will call you to schedule an appointment.     Return in about 6 months (around 07/15/2022) for follow up.

## 2022-01-14 NOTE — Assessment & Plan Note (Signed)
Chronic Blood pressure well controlled CMP Continue losartan 25 mg daily, carvedilol 25 mg twice daily

## 2022-01-15 ENCOUNTER — Telehealth: Payer: Self-pay

## 2022-01-15 NOTE — Telephone Encounter (Signed)
Verbals given to [facility] today.

## 2022-01-15 NOTE — Telephone Encounter (Signed)
[name] is calling requesting VO for OT 1 time a week for 2 weeks  ADL transfers and Exercise

## 2022-01-15 NOTE — Progress Notes (Signed)
Remote ICD transmission.   

## 2022-01-15 NOTE — Telephone Encounter (Signed)
Okay for orders? 

## 2022-01-16 ENCOUNTER — Ambulatory Visit: Payer: Medicare PPO | Attending: Physician Assistant

## 2022-01-16 DIAGNOSIS — G459 Transient cerebral ischemic attack, unspecified: Secondary | ICD-10-CM | POA: Diagnosis not present

## 2022-01-16 DIAGNOSIS — I4891 Unspecified atrial fibrillation: Secondary | ICD-10-CM | POA: Diagnosis not present

## 2022-01-16 DIAGNOSIS — R55 Syncope and collapse: Secondary | ICD-10-CM

## 2022-01-16 DIAGNOSIS — I4729 Other ventricular tachycardia: Secondary | ICD-10-CM | POA: Diagnosis not present

## 2022-01-28 ENCOUNTER — Ambulatory Visit: Payer: Medicare PPO | Admitting: Gastroenterology

## 2022-01-28 ENCOUNTER — Encounter: Payer: Self-pay | Admitting: Gastroenterology

## 2022-01-28 VITALS — BP 140/94 | HR 60 | Ht 71.0 in | Wt 194.2 lb

## 2022-01-28 DIAGNOSIS — G4733 Obstructive sleep apnea (adult) (pediatric): Secondary | ICD-10-CM

## 2022-01-28 DIAGNOSIS — Z7902 Long term (current) use of antithrombotics/antiplatelets: Secondary | ICD-10-CM

## 2022-01-28 DIAGNOSIS — E538 Deficiency of other specified B group vitamins: Secondary | ICD-10-CM

## 2022-01-28 DIAGNOSIS — I700 Atherosclerosis of aorta: Secondary | ICD-10-CM | POA: Diagnosis not present

## 2022-01-28 DIAGNOSIS — Z8673 Personal history of transient ischemic attack (TIA), and cerebral infarction without residual deficits: Secondary | ICD-10-CM

## 2022-01-28 DIAGNOSIS — I472 Ventricular tachycardia, unspecified: Secondary | ICD-10-CM | POA: Diagnosis not present

## 2022-01-28 DIAGNOSIS — M25559 Pain in unspecified hip: Secondary | ICD-10-CM

## 2022-01-28 DIAGNOSIS — Z7984 Long term (current) use of oral hypoglycemic drugs: Secondary | ICD-10-CM

## 2022-01-28 DIAGNOSIS — E119 Type 2 diabetes mellitus without complications: Secondary | ICD-10-CM | POA: Diagnosis not present

## 2022-01-28 DIAGNOSIS — Z8616 Personal history of COVID-19: Secondary | ICD-10-CM

## 2022-01-28 DIAGNOSIS — Z923 Personal history of irradiation: Secondary | ICD-10-CM

## 2022-01-28 DIAGNOSIS — Z7982 Long term (current) use of aspirin: Secondary | ICD-10-CM

## 2022-01-28 DIAGNOSIS — D649 Anemia, unspecified: Secondary | ICD-10-CM

## 2022-01-28 DIAGNOSIS — M170 Bilateral primary osteoarthritis of knee: Secondary | ICD-10-CM

## 2022-01-28 DIAGNOSIS — G3184 Mild cognitive impairment, so stated: Secondary | ICD-10-CM | POA: Diagnosis not present

## 2022-01-28 DIAGNOSIS — I110 Hypertensive heart disease with heart failure: Secondary | ICD-10-CM | POA: Diagnosis not present

## 2022-01-28 DIAGNOSIS — K219 Gastro-esophageal reflux disease without esophagitis: Secondary | ICD-10-CM

## 2022-01-28 DIAGNOSIS — E785 Hyperlipidemia, unspecified: Secondary | ICD-10-CM

## 2022-01-28 DIAGNOSIS — I509 Heart failure, unspecified: Secondary | ICD-10-CM | POA: Diagnosis not present

## 2022-01-28 DIAGNOSIS — M533 Sacrococcygeal disorders, not elsewhere classified: Secondary | ICD-10-CM

## 2022-01-28 DIAGNOSIS — G459 Transient cerebral ischemic attack, unspecified: Secondary | ICD-10-CM | POA: Diagnosis not present

## 2022-01-28 DIAGNOSIS — Z9079 Acquired absence of other genital organ(s): Secondary | ICD-10-CM

## 2022-01-28 DIAGNOSIS — G2581 Restless legs syndrome: Secondary | ICD-10-CM

## 2022-01-28 DIAGNOSIS — M25571 Pain in right ankle and joints of right foot: Secondary | ICD-10-CM

## 2022-01-28 DIAGNOSIS — I252 Old myocardial infarction: Secondary | ICD-10-CM | POA: Diagnosis not present

## 2022-01-28 DIAGNOSIS — M792 Neuralgia and neuritis, unspecified: Secondary | ICD-10-CM

## 2022-01-28 DIAGNOSIS — G8929 Other chronic pain: Secondary | ICD-10-CM

## 2022-01-28 DIAGNOSIS — M549 Dorsalgia, unspecified: Secondary | ICD-10-CM

## 2022-01-28 DIAGNOSIS — I428 Other cardiomyopathies: Secondary | ICD-10-CM | POA: Diagnosis not present

## 2022-01-28 DIAGNOSIS — H269 Unspecified cataract: Secondary | ICD-10-CM

## 2022-01-28 DIAGNOSIS — F411 Generalized anxiety disorder: Secondary | ICD-10-CM

## 2022-01-28 DIAGNOSIS — K2270 Barrett's esophagus without dysplasia: Secondary | ICD-10-CM

## 2022-01-28 DIAGNOSIS — Z8619 Personal history of other infectious and parasitic diseases: Secondary | ICD-10-CM

## 2022-01-28 DIAGNOSIS — Z9181 History of falling: Secondary | ICD-10-CM

## 2022-01-28 DIAGNOSIS — Z86010 Personal history of colonic polyps: Secondary | ICD-10-CM

## 2022-01-28 DIAGNOSIS — Z95810 Presence of automatic (implantable) cardiac defibrillator: Secondary | ICD-10-CM

## 2022-01-28 DIAGNOSIS — Z8546 Personal history of malignant neoplasm of prostate: Secondary | ICD-10-CM

## 2022-01-28 HISTORY — DX: Sacrococcygeal disorders, not elsewhere classified: M53.3

## 2022-01-28 NOTE — Progress Notes (Signed)
01/28/2022 AKAASH VANDEWATER [id number] April 19, 1951   HISTORY OF PRESENT ILLNESS: This is a 71 year old male who presents to our office today for which was initially relayed as rectal pain.  When he got here he expressed that this pain is over his tailbone.  He says that he has bad posture and tends to sit back and slouch on that area.  He did purchase a donut pillow.  He denies any rectal pain or bleeding.  Colonoscopy  10/2019: - One diminutive polyp in the distal sigmoid colon, removed with a cold biopsy forceps. Resected and retrieved. - Diverticulosis in the sigmoid colon. - The examination was otherwise normal on direct and retroflexion views. - Personal history of colonic polyps. Diminutive adenoma 2006 and diminutive polyp removed but not retrieved 2012.  Colon polyp was an inflammatory polyp.  No recall recommended due to age and overall history.   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   Not compatible with MRI    Barrett's esophagus 09/05/2004   2 cm changes seen at index EGD   Cataract    Chronic back pain 29/93/7169   Chronic systolic heart failure    Colon polyps    tubular adenoma   Dysrhythmia    Essential hypertension 08/26/2009   Fatigue 12/02/2020   Generalized anxiety disorder    since defibrillator placement   GERD (gastroesophageal reflux disease)    Hip pain 12/14/2016   History of COVID-19    History of myocardial infarction    Hyperlipidemia    Left knee pain 12/14/2016   Malignant neoplasm of prostate 09/27/2014   s/p prostatectomy   Mild anemia 01/20/2011   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 b. s/p Medtronic ICD.   Obstructive sleep apnea 11/10/2012   no CPAP use   Right foot pain 12/15/2017   RLS (restless  legs syndrome) 11/29/2012   S/P radiation therapy    10/10/2014 through 11/26/2014; Prostate bed 6600 cGy in 33 sessions   Shingles 07/28/2016   Type II diabetes mellitus 08/26/2009   Ventricular tachycardia ([place])    a. s/p ICD (generator change 09/2012). b. 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 and placed on sotalol   Vitamin B 12 deficiency 03/06/2011   Monthly B12 shots initiated  2011. Nasally  inhaled B12 as of 07/2012   Past Surgical History:  Procedure Laterality Date   CARDIAC DEFIBRILLATOR PLACEMENT  03/11/2004   Medtronic Maximo single lead   COLONOSCOPY W/ BIOPSIES AND POLYPECTOMY  08/2004, 02/24/11   10m adenoma in 2006,  5 mm polyp (not recovered) 2012   [place]A 10/08/2015   Procedure: CYSTOSCOPY WITH DIRECT VISION INTERNAL URETHROTOMY;  Surgeon: BNickie Retort MD;  [facility];  Service: Urology;  Laterality: N/A;   IMPLANTABLE CARDIOVERTER DEFIBRILLATOR GENERATOR CHANGE N/A 10/07/2012   Procedure: IMPLANTABLE CARDIOVERTER DEFIBRILLATOR GENERATOR CHANGE;  Surgeon: GEvans Lance MD;  [facility];  Service: Cardiovascular;  Laterality: N/A;   PROSTATECTOMY  06/2003   Dr NJanice Norrie  RECTAL SURGERY     UPPER GASTROINTESTINAL ENDOSCOPY  08/2004, 02/24/11   Barrett's esophagus    reports that he has never smoked. He has never used smokeless tobacco. He reports that he does not drink alcohol and does not use  drugs. family history includes Coronary artery disease in his father; Dementia in his brother, maternal aunt, mother, and sister; Diabetes in his father and paternal grandmother; Hypertension in his mother; Prostate cancer in his father. Allergies  Allergen Reactions   Zantac [Ranitidine Hcl]     itching      Outpatient Encounter Medications as of 01/28/2022  Medication Sig   ACCU-CHEK GUIDE test strip USE TO CHECK SUGAR 4 TIMES A DAY   acetaminophen (TYLENOL) 500 MG tablet Take 1,000 mg by mouth every 6 (six) hours as needed for  mild pain.   aspirin 81 MG chewable tablet Chew 1 tablet (81 mg total) by mouth daily for 21 days. Take aspirin and Plavix for 3 weeks, then stop taking aspirin after 3 weeks and continue with Plavix   atorvastatin (LIPITOR) 20 MG tablet TAKE 1 TABLET BY MOUTH DAILY EXCEPT 1/2 TABLET ON TUESDAY, THURSDAY AND SATURDAY. (Patient taking differently: Take 10-20 mg by mouth See admin instructions. Take 1 tablet ( 20 mg) by mouth daily except 1/2 tablet ( 10 mg) on Tuesday, Thursday and Saturday.)   carvedilol (COREG) 25 MG tablet TAKE 1 TABLET TWICE DAILY WITH MEALS   Cholecalciferol (VITAMIN D) 50 MCG (2000 UT) tablet Take 2,000 Units by mouth daily.   clopidogrel (PLAVIX) 75 MG tablet Take 1 tablet (75 mg total) by mouth daily.   digoxin (LANOXIN) 0.125 MG tablet Take 0.5 tablets (0.0625 mg total) by mouth daily.   docusate sodium (COLACE) 100 MG capsule Take 1 capsule (100 mg total) by mouth 2 (two) times daily as needed for mild constipation.   donepezil (ARICEPT) 10 MG tablet TAKE HALF TABLET (5 MG) DAILY FOR 2 WEEKS, THEN INCREASE TO THE FULL TABLET AT 10 MG DAILY (Patient taking differently: Take 10 mg by mouth daily.)   famotidine (PEPCID) 20 MG tablet Take 1 tablet (20 mg total) by mouth daily.   gabapentin (NEURONTIN) 100 MG capsule Take 1 capsule (100 mg total) by mouth 2 (two) times daily.   Lancets (ONETOUCH ULTRASOFT) lancets Use as instructed to test sugar daily and prn. Dx E11.9   losartan (COZAAR) 25 MG tablet Take 1 tablet (25 mg total) by mouth at bedtime.   pantoprazole (PROTONIX) 40 MG tablet Take 1 tablet (40 mg total) by mouth daily.   sitaGLIPtin-metformin (JANUMET) 50-1000 MG tablet Take 1 tablet by mouth daily. Take with a meal   sotalol (BETAPACE) 120 MG tablet TAKE 1 TABLET TWICE DAILY (Patient taking differently: Take 120 mg by mouth 2 (two) times daily.)   traMADol (ULTRAM) 50 MG tablet Take 1 tablet (50 mg total) by mouth every 6 (six) hours as needed. (Patient taking  differently: Take 50 mg by mouth every 6 (six) hours as needed for moderate pain.)   vitamin B-12 (CYANOCOBALAMIN) 1000 MCG tablet TAKE 1 TABLET EVERY DAY   No facility-administered encounter medications on file as of 01/28/2022.    REVIEW OF SYSTEMS  : All other systems reviewed and negative except where noted in the History of Present Illness.   PHYSICAL EXAM: BP (!) 140/94   Pulse 60   Ht '5\' 11"'$  (1.803 m)   Wt 194 lb 3.2 oz (88.1 kg)   BMI 27.09 kg/m  General: Well developed AA male in no acute distress Head: Normocephalic and atraumatic Eyes:  Sclerae anicteric, conjunctiva pink. Ears: Normal auditory acuity Rectal:  Pinpoint tenderness on coccyx bone.  No external abnormalities seen.  Rectal exam not performed. Musculoskeletal: Symmetrical with no gross  deformities  Skin: No lesions on visible extremities Neurological: Alert oriented x 4, grossly non-focal Psychological:  Alert and cooperative. Normal mood and affect  ASSESSMENT AND PLAN: *Coxalgia: Patient has pain/tenderness over his coccyx bone.  This was relayed as rectal pain when he was scheduled.  He has no rectal or perianal pain.  He says that his posture is poor and he tends to sit back/slouch.  He was asked to work on his posture.  He did purchase a donut pillow which he can use.  We also printed some stretches that he could do.  Can try Tylenol.  Could use occasional ibuprofen although needs to be careful with this in the setting of Plavix use.  Needs to follow-up with his PCP in regards to this issue.  CC:  Binnie Rail, MD

## 2022-01-28 NOTE — Patient Instructions (Addendum)
May use Tylenol as needed for pain.   Can try occasional Ibuprofen but not often due to Plavix.  Work on posture.   Try Donut Pillow or stretches.   Follow up with primary care.   _______________________________________________________  If you are age 70 or older, your body mass index should be between 23-30. Your Body mass index is 27.09 kg/m. If this is out of the aforementioned range listed, please consider follow up with your Primary Care Provider.  If you are age 67 or younger, your body mass index should be between 19-25. Your Body mass index is 27.09 kg/m. If this is out of the aformentioned range listed, please consider follow up with your Primary Care Provider.   ________________________________________________________  The [place] [facility] providers would like to encourage you to use [facility] to communicate with providers for non-urgent requests or questions.  Due to long hold times on the telephone, sending your provider a message by [facility] may be a faster and more efficient way to get a response.  Please allow 48 business hours for a response.  Please remember that this is for non-urgent requests.  _______________________________________________________

## 2022-01-28 NOTE — Progress Notes (Unsigned)
Subjective:    Patient ID: LAIKEN NOHR, male    DOB: 1951-05-05, 71 y.o.   MRN: [id number]      HPI Rylei is here for No chief complaint on file.    Dizziness at night -     Medications and allergies reviewed with patient and updated if appropriate.  Current Outpatient Medications on File Prior to Visit  Medication Sig Dispense Refill   ACCU-CHEK GUIDE test strip USE TO CHECK SUGAR 4 TIMES A DAY 100 strip 4   acetaminophen (TYLENOL) 500 MG tablet Take 1,000 mg by mouth every 6 (six) hours as needed for mild pain.     aspirin 81 MG chewable tablet Chew 1 tablet (81 mg total) by mouth daily for 21 days. Take aspirin and Plavix for 3 weeks, then stop taking aspirin after 3 weeks and continue with Plavix 21 tablet 0   atorvastatin (LIPITOR) 20 MG tablet TAKE 1 TABLET BY MOUTH DAILY EXCEPT 1/2 TABLET ON TUESDAY, THURSDAY AND SATURDAY. (Patient taking differently: Take 10-20 mg by mouth See admin instructions. Take 1 tablet ( 20 mg) by mouth daily except 1/2 tablet ( 10 mg) on Tuesday, Thursday and Saturday.) 24 tablet 8   carvedilol (COREG) 25 MG tablet TAKE 1 TABLET TWICE DAILY WITH MEALS 180 tablet 1   Cholecalciferol (VITAMIN D) 50 MCG (2000 UT) tablet Take 2,000 Units by mouth daily.     clopidogrel (PLAVIX) 75 MG tablet Take 1 tablet (75 mg total) by mouth daily. 30 tablet 3   digoxin (LANOXIN) 0.125 MG tablet Take 0.5 tablets (0.0625 mg total) by mouth daily. 45 tablet 3   docusate sodium (COLACE) 100 MG capsule Take 1 capsule (100 mg total) by mouth 2 (two) times daily as needed for mild constipation. 60 capsule 2   donepezil (ARICEPT) 10 MG tablet TAKE HALF TABLET (5 MG) DAILY FOR 2 WEEKS, THEN INCREASE TO THE FULL TABLET AT 10 MG DAILY (Patient taking differently: Take 10 mg by mouth daily.) 90 tablet 1   famotidine (PEPCID) 20 MG tablet Take 1 tablet (20 mg total) by mouth daily. 90 tablet 3   gabapentin (NEURONTIN) 100 MG capsule Take 1 capsule (100 mg total) by mouth 2  (two) times daily. 60 capsule 2   Lancets (ONETOUCH ULTRASOFT) lancets Use as instructed to test sugar daily and prn. Dx E11.9 100 each 12   losartan (COZAAR) 25 MG tablet Take 1 tablet (25 mg total) by mouth at bedtime. 90 tablet 2   pantoprazole (PROTONIX) 40 MG tablet Take 1 tablet (40 mg total) by mouth daily. 30 tablet 2   sitaGLIPtin-metformin (JANUMET) 50-1000 MG tablet Take 1 tablet by mouth daily. Take with a meal 30 tablet 8   sotalol (BETAPACE) 120 MG tablet TAKE 1 TABLET TWICE DAILY (Patient taking differently: Take 120 mg by mouth 2 (two) times daily.) 120 tablet 2   traMADol (ULTRAM) 50 MG tablet Take 1 tablet (50 mg total) by mouth every 6 (six) hours as needed. (Patient taking differently: Take 50 mg by mouth every 6 (six) hours as needed for moderate pain.) 30 tablet 0   vitamin B-12 (CYANOCOBALAMIN) 1000 MCG tablet TAKE 1 TABLET EVERY DAY 90 tablet 1   No current facility-administered medications on file prior to visit.    Review of Systems     Objective:  There were no vitals filed for this visit. BP Readings from Last 3 Encounters:  01/28/22 (!) 140/94  01/14/22 136/70  01/07/22 135/75  Wt Readings from Last 3 Encounters:  01/28/22 194 lb 3.2 oz (88.1 kg)  01/14/22 190 lb 9.6 oz (86.5 kg)  01/05/22 198 lb (89.8 kg)   There is no height or weight on file to calculate BMI.    Physical Exam         Assessment & Plan:    See Problem List for Assessment and Plan of chronic medical problems.

## 2022-01-29 ENCOUNTER — Encounter: Payer: Self-pay | Admitting: Internal Medicine

## 2022-01-29 ENCOUNTER — Ambulatory Visit: Payer: Medicare PPO | Admitting: Internal Medicine

## 2022-01-29 VITALS — BP 138/78 | HR 65 | Temp 98.0°F | Ht 71.0 in | Wt 191.2 lb

## 2022-01-29 DIAGNOSIS — I10 Essential (primary) hypertension: Secondary | ICD-10-CM

## 2022-01-29 DIAGNOSIS — R42 Dizziness and giddiness: Secondary | ICD-10-CM

## 2022-01-29 NOTE — Patient Instructions (Addendum)
       Medications changes include :   stop gabapentin for now and try melatonin at night.   Try a restless leg cream at night.        Return if symptoms worsen or fail to improve.

## 2022-01-29 NOTE — Assessment & Plan Note (Signed)
Chronic BP controlled Continue coreg 25 mg bid, losartan 25 mg daily, sotalol 120 mg bid

## 2022-01-29 NOTE — Assessment & Plan Note (Signed)
Acute Started one week ago - seems to occuring primarily at night but has had at least one episode during the day ? Related to head movements - possibly ? BPPV ? Medication related - taking gabapentin and losartan at night and is more symptomatic at night - will try holding gabapentin for now and take melatonin at night which worked for him in the past Can consider moving the losartan if needed to the am Keep hydrating Consider vestibular PT [name] will update me on his symptoms

## 2022-02-04 ENCOUNTER — Telehealth: Payer: Self-pay | Admitting: Internal Medicine

## 2022-02-04 NOTE — Telephone Encounter (Signed)
Critical EKG report  

## 2022-02-04 NOTE — Telephone Encounter (Signed)
[employer] is calling to report a critical EKG on this pt.   Per the Rep, at 0600 this morning CST the pt had a run of v-tach for 10 beats at a rate of 160 bpm, then went back into sinus rhythm at 60 bpm thereafter.  This was auto-triggered event.   Rep will fax over report here shortly at [phone].  Triage to await report and initiate monitor protocol thereafter.

## 2022-02-04 NOTE — Telephone Encounter (Signed)
   Cardiac Monitor Alert  Date of alert:  02/04/2022   Patient Name: [name]  DOB: [birth year]  MRN: [id number]   [place] Cardiologist: None  [facility] EP:  Cristopher Peru, MD    Monitor Information: Cardiac Event Monitor [[employer]]  Reason: atrial fibrillation and TIA Ordering provider:  Tommye Standard, PA-C   Alert Ventricular Tachycardia This is the 1st alert for this rhythm.   Next Cardiology Appointment   Date:  02/05/22  Provider:  Oda Kilts, PA-C Arrhythmia, symptoms and history reviewed with DOD, Dr. Ali Lowe.   Plan:  Keep follow up as planned and continue to monitor. Patient has an ICD.    Antonieta Iba, RN  02/04/2022 10:11 AM

## 2022-02-04 NOTE — Progress Notes (Unsigned)
Electrophysiology Office Note Date: 02/05/2022  ID:  JEYDEN COFFELT, DOB [birth year], MRN [id number]  PCP: Binnie Rail, MD Primary Cardiologist: None Electrophysiologist: Cristopher Peru, MD   CC: Routine ICD follow-up  [name] is a 71 y.o. male seen today for Cristopher Peru, MD for post hospital follow up.    Admitted 8/28 - 8/30 with TIA. Sent home with 30 day monitor to r/u AF (His ICD is single chamber) Inpatient work up included Code Stroke CT head Atrophy with small vessel chronic ischemic changes of deep cerebral white matter. CTA head & neck Negative CTA for large vessel occlusion or other emergent finding. 2D Echo EF 62-69%, grade 1 diastolic dysfunction with global hypokinesis  LDL 64 HgbA1c 6.2 VTE prophylaxis - lovenox aspirin 81 mg daily prior to admission, now on aspirin 81 mg daily and clopidogrel 75 mg daily.  For 3 weeks and then Plavix alone Therapy recommendations:  home health PT/OT  Since discharge from hospital the patient reports doing OK. His dizziness is much improved, but still present most days.  he denies chest pain, palpitations, dyspnea, PND, orthopnea, nausea, vomiting, syncope, edema, weight gain, or early satiety.   Device information MDT single chamber ICD, current system implanted 10/07/2012 Initial device implant was ?2005, 6949 lead was capped/abandoned AAD 2012, VT storm, numerous shocks, started on sotalol  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   Not compatible with MRI    Barrett's esophagus 09/05/2004   2 cm changes seen at index EGD   Cataract    Chronic back pain 48/54/6270   Chronic systolic heart failure    Colon polyps    tubular adenoma   Dysrhythmia    Essential hypertension 08/26/2009   Fatigue 12/02/2020   Generalized anxiety disorder    since defibrillator placement   GERD  (gastroesophageal reflux disease)    Hip pain 12/14/2016   History of COVID-19    History of myocardial infarction    Hyperlipidemia    Left knee pain 12/14/2016   Malignant neoplasm of prostate 09/27/2014   s/p prostatectomy   Mild anemia 01/20/2011   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 b. s/p Medtronic ICD.   Obstructive sleep apnea 11/10/2012   no CPAP use   Right foot pain 12/15/2017   RLS (restless legs syndrome) 11/29/2012   S/P radiation therapy    10/10/2014 through 11/26/2014; Prostate bed 6600 cGy in 33 sessions   Shingles 07/28/2016   Type II diabetes mellitus 08/26/2009   Ventricular tachycardia ([place])    a. s/p ICD (generator change 09/2012). b. 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 and placed on sotalol   Vitamin B 12 deficiency 03/06/2011   Monthly B12 shots initiated  2011. Nasally  inhaled B12 as of 07/2012   Past Surgical History:  Procedure Laterality Date   CARDIAC DEFIBRILLATOR PLACEMENT  03/11/2004   Medtronic Maximo single lead   COLONOSCOPY W/ BIOPSIES AND POLYPECTOMY  08/2004, 02/24/11   60m adenoma in 2006,  5 mm polyp (not recovered) 2012   [place]NAL URETHROTOMY N/A 10/08/2015   Procedure: CYSTOSCOPY WITH DIRECT VISION INTERNAL URETHROTOMY;  Surgeon: BNickie Retort MD;  Location: [facility];  Service: Urology;  Laterality: N/A;   IMPLANTABLE CARDIOVERTER DEFIBRILLATOR GENERATOR CHANGE N/A 10/07/2012  Procedure: IMPLANTABLE CARDIOVERTER DEFIBRILLATOR GENERATOR CHANGE;  Surgeon: Evans Lance, MD;  Location: [facility] CATH LAB;  Service: Cardiovascular;  Laterality: N/A;   PROSTATECTOMY  06/2003   Dr Janice Norrie   RECTAL SURGERY     UPPER GASTROINTESTINAL ENDOSCOPY  08/2004, 02/24/11   Barrett's esophagus    Current Outpatient Medications  Medication Sig Dispense Refill   ACCU-CHEK GUIDE test strip USE TO CHECK SUGAR 4 TIMES A DAY 100 strip 4    acetaminophen (TYLENOL) 500 MG tablet Take 1,000 mg by mouth every 6 (six) hours as needed for mild pain.     atorvastatin (LIPITOR) 20 MG tablet TAKE 1 TABLET BY MOUTH DAILY EXCEPT 1/2 TABLET ON TUESDAY, THURSDAY AND SATURDAY. (Patient taking differently: Take 10-20 mg by mouth See admin instructions. Take 1 tablet ( 20 mg) by mouth daily except 1/2 tablet ( 10 mg) on Tuesday, Thursday and Saturday.) 24 tablet 8   carvedilol (COREG) 25 MG tablet TAKE 1 TABLET TWICE DAILY WITH MEALS 180 tablet 1   Cholecalciferol (VITAMIN D) 50 MCG (2000 UT) tablet Take 2,000 Units by mouth daily.     clopidogrel (PLAVIX) 75 MG tablet Take 1 tablet (75 mg total) by mouth daily. 30 tablet 3   digoxin (LANOXIN) 0.125 MG tablet Take 0.5 tablets (0.0625 mg total) by mouth daily. 45 tablet 3   docusate sodium (COLACE) 100 MG capsule Take 1 capsule (100 mg total) by mouth 2 (two) times daily as needed for mild constipation. 60 capsule 2   donepezil (ARICEPT) 10 MG tablet TAKE HALF TABLET (5 MG) DAILY FOR 2 WEEKS, THEN INCREASE TO THE FULL TABLET AT 10 MG DAILY (Patient taking differently: Take 10 mg by mouth daily.) 90 tablet 1   famotidine (PEPCID) 20 MG tablet Take 1 tablet (20 mg total) by mouth daily. 90 tablet 3   gabapentin (NEURONTIN) 100 MG capsule Take 1 capsule (100 mg total) by mouth 2 (two) times daily. 60 capsule 2   Lancets (ONETOUCH ULTRASOFT) lancets Use as instructed to test sugar daily and prn. Dx E11.9 100 each 12   losartan (COZAAR) 25 MG tablet Take 1 tablet (25 mg total) by mouth at bedtime. 90 tablet 2   pantoprazole (PROTONIX) 40 MG tablet Take 1 tablet (40 mg total) by mouth daily. 30 tablet 2   sitaGLIPtin-metformin (JANUMET) 50-1000 MG tablet Take 1 tablet by mouth daily. Take with a meal 30 tablet 8   sotalol (BETAPACE) 120 MG tablet TAKE 1 TABLET TWICE DAILY (Patient taking differently: Take 120 mg by mouth 2 (two) times daily.) 120 tablet 2   traMADol (ULTRAM) 50 MG tablet Take 1 tablet (50 mg  total) by mouth every 6 (six) hours as needed. (Patient taking differently: Take 50 mg by mouth every 6 (six) hours as needed for moderate pain.) 30 tablet 0   vitamin B-12 (CYANOCOBALAMIN) 1000 MCG tablet TAKE 1 TABLET EVERY DAY 90 tablet 1   No current facility-administered medications for this visit.    Allergies:   Zantac [ranitidine hcl]   Social History: Social History   Socioeconomic History   Marital status: Married    Spouse name: Not on file   Number of children: 2   Years of education: 16   Highest education level: Bachelor's degree (e.g., BA, AB, BS)  Occupational History   Occupation: Therapist, sports: [employer]  Tobacco Use   Smoking status: Never   Smokeless tobacco: Never  Vaping Use   Vaping Use: Never used  Substance and Sexual Activity   Alcohol use: No   Drug use: No   Sexual activity: Not on file  Other Topics Concern   Not on file  Social History Narrative   Right handed   No caffeine   Two story home   Social Determinants of Health   Financial Resource Strain: Low Risk  (11/21/2021)   Overall Financial Resource Strain (CARDIA)    Difficulty of Paying Living Expenses: Not hard at all  Food Insecurity: No Food Insecurity (11/21/2021)   Hunger Vital Sign    Worried About Running Out of Food in the Last Year: Never true    Ran Out of Food in the Last Year: Never true  Transportation Needs: No Transportation Needs (11/21/2021)   PRAPARE - Hydrologist (Medical): No    Lack of Transportation (Non-Medical): No  Physical Activity: Sufficiently Active (11/21/2021)   Exercise Vital Sign    Days of Exercise per Week: 5 days    Minutes of Exercise per Session: 60 min  Stress: No Stress Concern Present (11/21/2021)   [place]    Feeling of Stress : Not at all  Social Connections: [place] (11/21/2021)   Social Connection and Isolation Panel  [NHANES]    Frequency of Communication with Friends and Family: More than three times a week    Frequency of Social Gatherings with Friends and Family: More than three times a week    Attends Religious Services: More than 4 times per year    Active Member of [employer] or Organizations: Yes    Attends Music therapist: More than 4 times per year    Marital Status: Married  Human resources officer Violence: Not At Risk (11/21/2021)   Humiliation, Afraid, Rape, and Kick questionnaire    Fear of Current or Ex-Partner: No    Emotionally Abused: No    Physically Abused: No    Sexually Abused: No    Family History: Family History  Problem Relation Age of Onset   Dementia Mother    Hypertension Mother    Coronary artery disease Father    Prostate cancer Father    Diabetes Father    Dementia Sister    Dementia Brother    Diabetes Paternal Grandmother    Dementia Maternal Aunt    Stroke Neg Hx    Colon cancer Neg Hx    Colon polyps Neg Hx    Esophageal cancer Neg Hx    Rectal cancer Neg Hx    Stomach cancer Neg Hx     Review of Systems: All other systems reviewed and are otherwise negative except as noted above.   Physical Exam: Vitals:   02/05/22 0954  BP: 136/86  Pulse: 68  SpO2: 97%  Weight: 193 lb (87.5 kg)  Height: '5\' 11"'$  (1.803 m)     GEN- The patient is well appearing, alert and oriented x 3 today.   HEENT: normocephalic, atraumatic; sclera clear, conjunctiva pink; hearing intact; oropharynx clear; neck supple, no JVP Lymph- no cervical lymphadenopathy Lungs- Clear to ausculation bilaterally, normal work of breathing.  No wheezes, rales, rhonchi Heart- Regular  rate and rhythm, no murmurs, rubs or gallops, PMI not laterally displaced GI- soft, non-tender, non-distended, bowel sounds present, no hepatosplenomegaly Extremities- no clubbing or cyanosis. No peripheral edema; DP/PT/radial pulses 2+ bilaterally MS- no significant deformity or atrophy Skin- warm and  dry, no rash or lesion; ICD pocket well healed Psych-  euthymic mood, full affect Neuro- strength and sensation are intact  ICD interrogation- not checked today. No alerts and pending wearable monitor.   EKG:  EKG is not ordered today.  Recent Labs: 08/04/2021: TSH 3.31 01/06/2022: ALT 20; BUN 8; Creatinine, Ser 0.87; Hemoglobin 12.3; Platelets 218; Potassium 3.5; Sodium 140   Wt Readings from Last 3 Encounters:  02/05/22 193 lb (87.5 kg)  01/29/22 191 lb 3.2 oz (86.7 kg)  01/28/22 194 lb 3.2 oz (88.1 kg)     Other studies Reviewed: Additional studies/ records that were reviewed today include: Previous EP office notes.   Assessment and Plan:  1.  Chronic systolic dysfunction s/p Medtronic single chamber ICD  euvolemic today Stable on an appropriate medical regimen ICD not checked today; No alerts and non-contributory to his TIA as it is a single chamber device.  He is wearing a 30 day monitor to look for AF given cryptogenic TIA.  See Pace Art report No changes today  2. H/o VT storm Continue sotalol      3. HTN 4. Orthostasis 5. Lightheadedness. Overall he is feeling better.  Dr. Quay Burow adjusting Gabapentin to see if this helps.  Continue losartan.  Encouraged compression garments.   6. TIA No residual deficits.  Again increases my concern that his overall dizziness has a neurologic component.   Current medicines are reviewed at length with the patient today.    Disposition:   Follow up with Dr. Lovena Le in 3 months. Sooner pending monitor results.    Jacalyn Lefevre, PA-C  02/05/2022 10:04 AM  [facility] [address] [place]  [place] [address] [phone] (office) [phone] (fax)

## 2022-02-05 ENCOUNTER — Encounter: Payer: Self-pay | Admitting: Student

## 2022-02-05 ENCOUNTER — Ambulatory Visit: Payer: Medicare PPO | Attending: Student | Admitting: Student

## 2022-02-05 ENCOUNTER — Ambulatory Visit: Payer: Medicare PPO | Admitting: Internal Medicine

## 2022-02-05 VITALS — BP 136/86 | HR 68 | Ht 71.0 in | Wt 193.0 lb

## 2022-02-05 DIAGNOSIS — I4729 Other ventricular tachycardia: Secondary | ICD-10-CM

## 2022-02-05 DIAGNOSIS — I5022 Chronic systolic (congestive) heart failure: Secondary | ICD-10-CM

## 2022-02-05 DIAGNOSIS — I428 Other cardiomyopathies: Secondary | ICD-10-CM | POA: Diagnosis not present

## 2022-02-05 DIAGNOSIS — I472 Ventricular tachycardia, unspecified: Secondary | ICD-10-CM | POA: Diagnosis not present

## 2022-02-05 DIAGNOSIS — G459 Transient cerebral ischemic attack, unspecified: Secondary | ICD-10-CM

## 2022-02-05 DIAGNOSIS — I951 Orthostatic hypotension: Secondary | ICD-10-CM

## 2022-02-05 NOTE — Patient Instructions (Signed)
Medication Instructions:  Your physician recommends that you continue on your current medications as directed. Please refer to the Current Medication list given to you today.  *If you need a refill on your cardiac medications before your next appointment, please call your pharmacy*   Lab Work: None  If you have labs (blood work) drawn today and your tests are completely normal, you will receive your results only by: [place] (if you have MyChart) OR A paper copy in the mail If you have any lab test that is abnormal or we need to change your treatment, we will call you to review the results.   Follow-Up: At [facility], you and your health needs are our priority.  As part of our continuing mission to provide you with exceptional heart care, we have created designated Provider Care Teams.  These Care Teams include your primary Cardiologist (physician) and Advanced Practice Providers (APPs -  Physician Assistants and Nurse Practitioners) who all work together to provide you with the care you need, when you need it.   Your next appointment:   3 month(s)  The format for your next appointment:   In Person  Provider:   Cristopher Peru, MD   Important Information About Sugar

## 2022-02-10 NOTE — Progress Notes (Unsigned)
Initial neurology clinic note  SERVICE DATE: 02/11/22  Reason for Evaluation: Consultation requested by Jesse Balding, MD for an opinion regarding neuropathy. My final recommendations will be communicated back to the requesting physician by way of shared medical record or letter to requesting physician via [place] mail.  HPI: This is [name], a 71 y.o. right-handed male with a medical history of DM, HTN, HLD, OA, prostate cancer (~2005 s/p surgery and radiation), OSA (on CPAP), cardiomyopathy c/b HFrEF s/p PM, TIA on 01/05/22 who presents to neurology clinic with the chief complaint of pain in legs. The patient is accompanied by wife.  Patient has aching, stinging, and stabbing pains in his legs from calves into his feet. It keeps him up at night. This has been present for over a decade. It has stayed about the same over the years. It comes and goes and is worse at night. He has tried lidocaine cream which helps with his symptoms. If he gets up and walks around at night, this also helps with symptoms. Patient previously tried gabapentin, but was getting dizzy at night, so he stopped this.  Patient started noticing gait imbalance about 1 year ago. He would lose balance to both sides. He had 1 fall at work. He fell off a ridge that he didn't see. He does not use an assistive device to walk.   Patient was previously seen by Dr. Durward Hernandez in ortho. Per his clinic note from 01/14/22: Plan: [name] was last seen in June for evaluation of back pain associated with bilateral lower extremity weakness.  X-rays of his lumbar and thoracic spine demonstrated significant degenerative changes.  A CT scan of the lumbar spine was obtained as he has a cardiac pacemaker and cannot have an MRI scan.  This demonstrated multilevel facet arthropathy which causes mild mild neural foraminal narrowing bilaterally at L2-3, L3-4 and L4-5 as well as on the right at L1-2 and L5 1.  There was no canal stenosis.   Narrowing of the lateral recesses at L3-4 and L4-5 could affect the descending L4 and L5 nerve roots respectively.  I had Dr. Ernestina Hernandez performed EMGs nerve conduction velocities of both lower extremities.  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  He is diabetic and I would assume that the neuropathy is based on the diabetes.  He continues to have some trouble with his back which is controlled with Tylenol.  He is taking Plavix for his atrial fibrillation.  I would like to try a course of physical therapy.  In the interim he has had a stroke and is receiving home health therapy through [facility] and will contact them to see if they can perform some thoracic and lumbar exercises.  I would also like him to return to the neurologist to evaluate his neuropathy.  I did place him on gabapentin and he notes that this had helped him sleep and he is feeling a little bit better but I like them to follow-up on that.  All questions were answered.  Patient presented to the ED on 01/05/22 for an episode of difficulty talking and mild weakness of the right upper extremity. Symptoms lasted about 15 minutes. CT head showed no acute process (chronic atrophy and microvascular ischemia). CTA head and neck showed no LVO or other emergent finding (no significant carotid disease). Neurology was consulted and recommended ASA and Plavix. HbA1c was  6.2, LDL 64, Echo w/ EF of 35-40%. Plan was for asa '81mg'$  daily and plavix 75 mg daily for 3 weeks followed by plavix monotherapy. Patient is currently on plavix monotherapy. He was also discharged with 30 day heart monitoring (which he is currently wearing). MRI brain was not done because he PM was not compatible per wife.  The patient does not report symptoms referable to autonomic dysfunction including impaired sweating, heat or cold intolerance, excessive mucosal  dryness, gastroparetic early satiety, postprandial abdominal bloating, constipation, bowel or bladder dyscontrol, or syncope/presyncope/orthostatic intolerance.  He does not report any constitutional symptoms like fever, night sweats, anorexia or unintentional weight loss.  EtOH use: No  Restrictive diet? No Family history of neuropathy/myopathy/NM disease? No, but family history of dementia   MEDICATIONS:  Outpatient Encounter Medications as of 02/11/2022  Medication Sig   ACCU-CHEK GUIDE test strip USE TO CHECK SUGAR 4 TIMES A DAY   acetaminophen (TYLENOL) 500 MG tablet Take 1,000 mg by mouth every 6 (six) hours as needed for mild pain.   atorvastatin (LIPITOR) 20 MG tablet TAKE 1 TABLET BY MOUTH DAILY EXCEPT 1/2 TABLET ON TUESDAY, THURSDAY AND SATURDAY. (Patient taking differently: Take 10-20 mg by mouth See admin instructions. Take 1 tablet ( 20 mg) by mouth daily except 1/2 tablet ( 10 mg) on Tuesday, Thursday and Saturday.)   carvedilol (COREG) 25 MG tablet TAKE 1 TABLET TWICE DAILY WITH MEALS   Cholecalciferol (VITAMIN D) 50 MCG (2000 UT) tablet Take 2,000 Units by mouth daily.   clopidogrel (PLAVIX) 75 MG tablet Take 1 tablet (75 mg total) by mouth daily.   digoxin (LANOXIN) 0.125 MG tablet Take 0.5 tablets (0.0625 mg total) by mouth daily.   docusate sodium (COLACE) 100 MG capsule Take 1 capsule (100 mg total) by mouth 2 (two) times daily as needed for mild constipation.   donepezil (ARICEPT) 10 MG tablet TAKE HALF TABLET (5 MG) DAILY FOR 2 WEEKS, THEN INCREASE TO THE FULL TABLET AT 10 MG DAILY (Patient taking differently: Take 10 mg by mouth daily.)   famotidine (PEPCID) 20 MG tablet Take 1 tablet (20 mg total) by mouth daily.   gabapentin (NEURONTIN) 100 MG capsule Take 1 capsule (100 mg total) by mouth 2 (two) times daily.   Lancets (ONETOUCH ULTRASOFT) lancets Use as instructed to test sugar daily and prn. Dx E11.9   losartan (COZAAR) 25 MG tablet Take 1 tablet (25 mg total) by  mouth at bedtime.   pantoprazole (PROTONIX) 40 MG tablet Take 1 tablet (40 mg total) by mouth daily.   sitaGLIPtin-metformin (JANUMET) 50-1000 MG tablet Take 1 tablet by mouth daily. Take with a meal   sotalol (BETAPACE) 120 MG tablet TAKE 1 TABLET TWICE DAILY (Patient taking differently: Take 120 mg by mouth 2 (two) times daily.)   traMADol (ULTRAM) 50 MG tablet Take 1 tablet (50 mg total) by mouth every 6 (six) hours as needed. (Patient taking differently: Take 50 mg by mouth every 6 (six) hours as needed for moderate pain.)   vitamin B-12 (CYANOCOBALAMIN) 1000 MCG tablet TAKE 1 TABLET EVERY DAY   No facility-administered encounter medications on file as of 02/11/2022.    PAST MEDICAL HISTORY: 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   Not compatible with MRI    Barrett's esophagus 09/05/2004   2 cm changes seen at index EGD   Cataract  Chronic back pain 26/83/4196   Chronic systolic heart failure    Colon polyps    tubular adenoma   Dysrhythmia    Essential hypertension 08/26/2009   Fatigue 12/02/2020   Generalized anxiety disorder    since defibrillator placement   GERD (gastroesophageal reflux disease)    Hip pain 12/14/2016   History of COVID-19    History of myocardial infarction    Hyperlipidemia    Left knee pain 12/14/2016   Malignant neoplasm of prostate 09/27/2014   s/p prostatectomy   Mild anemia 01/20/2011   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 b. s/p Medtronic ICD.   Obstructive sleep apnea 11/10/2012   no CPAP use   Right foot pain 12/15/2017   RLS (restless legs syndrome) 11/29/2012   S/P radiation therapy    10/10/2014 through 11/26/2014; Prostate bed 6600 cGy in 33 sessions   Shingles 07/28/2016   Type II diabetes mellitus  08/26/2009   Ventricular tachycardia ([place])    a. s/p ICD (generator change 09/2012). b. 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 and placed on sotalol   Vitamin B 12 deficiency 03/06/2011   Monthly B12 shots initiated  2011. Nasally  inhaled B12 as of 07/2012    PAST SURGICAL HISTORY: Past Surgical History:  Procedure Laterality Date   CARDIAC DEFIBRILLATOR PLACEMENT  03/11/2004   Medtronic Maximo single lead   COLONOSCOPY W/ BIOPSIES AND POLYPECTOMY  08/2004, 02/24/11   57m adenoma in 2006,  5 mm polyp (not recovered) 2012   [place]NAL URETHROTOMY N/A 10/08/2015   Procedure: CYSTOSCOPY WITH DIRECT VISION INTERNAL URETHROTOMY;  Surgeon: BNickie Retort MD;  Location: [facility];  Service: Urology;  Laterality: N/A;   IMPLANTABLE CARDIOVERTER DEFIBRILLATOR GENERATOR CHANGE N/A 10/07/2012   Procedure: IMPLANTABLE CARDIOVERTER DEFIBRILLATOR GENERATOR CHANGE;  Surgeon: GEvans Lance MD;  [facility];  Service: Cardiovascular;  Laterality: N/A;   PROSTATECTOMY  06/2003   Dr NJanice Norrie  RECTAL SURGERY     UPPER GASTROINTESTINAL ENDOSCOPY  08/2004, 02/24/11   Barrett's esophagus    ALLERGIES: Allergies  Allergen Reactions   Zantac [Ranitidine Hcl]     itching    FAMILY HISTORY: Family History  Problem Relation Age of Onset   Dementia Mother    Hypertension Mother    Coronary artery disease Father    Prostate cancer Father    Diabetes Father    Dementia Sister    Dementia Brother    Diabetes Paternal Grandmother    Dementia Maternal Aunt    Stroke Neg Hx    Colon cancer Neg Hx    Colon polyps Neg Hx    Esophageal cancer Neg Hx    Rectal cancer Neg Hx    Stomach cancer Neg Hx     SOCIAL HISTORY: Social History   Tobacco Use   Smoking status: Never   Smokeless tobacco: Never  Vaping Use   Vaping Use: Never used  Substance Use Topics   Alcohol use: No   Drug use: No   Social History   Social History Narrative   Right handed   No caffeine   Two story home      OBJECTIVE: PHYSICAL EXAM: BP 127/82   Pulse 73   Ht '5\' 11"'$  (1.803 m)   Wt 193 lb (87.5 kg)   SpO2 99%   BMI 26.92 kg/m   General: General appearance: Awake and  alert. No distress. Cooperative with exam.  Skin: No obvious rash or jaundice. HEENT: Atraumatic. Anicteric. Lungs: Non-labored breathing on room air  Extremities: No edema. No obvious deformity.  Musculoskeletal: No obvious joint swelling. Psych: Affect appropriate.  Neurological: Mental Status: Alert. Speech fluent. No pseudobulbar affect Cranial Nerves: CNII: No RAPD. Visual fields grossly intact. CNIII, IV, VI: PERRL. No nystagmus. EOMI. CN V: Facial sensation intact bilaterally to fine touch. CN VII: Facial muscles symmetric and strong. No ptosis at rest. CN VIII: Hearing grossly intact bilaterally. CN IX: No hypophonia. CN X: Palate elevates symmetrically. CN XI: Full strength shoulder shrug bilaterally. CN XII: Tongue protrusion full and midline. No atrophy or fasciculations. No significant dysarthria Motor: Tone is normal. No fasciculations in extremities. Atrophy of right FDI.  Individual muscle group testing (MRC grade out of 5):  Movement     Neck flexion 5    Neck extension 5     Right Left   Shoulder abduction 5 5   Shoulder adduction 5 5   Shoulder ext rotation 5 5   Shoulder int rotation 5 5   Elbow flexion 5 5   Elbow extension 5 5   Wrist extension 5 5   Wrist flexion 5 5   Finger abduction - FDI 4 5   Finger abduction - ADM 4+ 5   Finger extension 5 5   Finger distal flexion - 2/'3 5 5   '$ Finger distal flexion - 4/'5 5 5   '$ Thumb flexion - FPL 5 5   Thumb abduction - APB 4+ 4+    Hip flexion 5- 5-   Hip extension 5 5   Hip adduction 5 5   Hip abduction 5 5   Knee extension 5 5   Knee flexion 5 5   Dorsiflexion 5 5   Plantarflexion 5 5   Inversion 5 5   Eversion 5 5   Great toe extension 5 5   Great toe flexion 5 5     Reflexes:  Right Left   Bicep 2+ 2+   Tricep  1+ 1+   BrRad 1+ 1+   Knee 2+ 2+   Ankle 2+ 2+    Pathological Reflexes: Babinski: mute response bilaterally Hoffman: absent bilaterally Troemner: absent bilaterally Sensation: Pinprick: 75% in medial aspect of palm bilaterally, otherwise intact, including in the toes (per patient report he may feel more sensation in toes) Vibration: Intact in upper extremity. 1-2 seconds in right great toe, 3-4 seconds in left great toe Proprioception: Intact in bilateral great toes. Coordination: Intact finger-to- nose-finger bilaterally. Romberg with moderate sway Gait: Unable to rise from chair with arms crossed unassisted. Wide-based slow, unsteady gait. Unable to tandem walk. Able to walk on toes and heels.  Lab and Test Review: Internal labs: Normal or unremarkable: CMP, HIV, drug screen, EtOH panel, TSH, ferritin, vit D HbA1c (01/06/22): 6.2 (7.3 on 04/13/19) B12 (08/04/21): 974  CT head (01/06/22): FINDINGS: Streak/beam hardening artifact arising from dental restoration partially obscures the posterior fossa.   Brain:   No age advanced or lobar predominant parenchymal atrophy.   Mild patchy and ill-defined hypoattenuation within the cerebral white matter, nonspecific but compatible with chronic small vessel ischemic disease.   There is no acute intracranial hemorrhage.   No demarcated cortical infarct.   No extra-axial fluid collection.   No evidence of an intracranial mass.   No midline shift.   Vascular: No hyperdense vessel. Atherosclerotic calcifications.   Skull: No fracture or aggressive osseous  lesion.   Sinuses/Orbits: No mass or acute finding within the imaged orbits. Trace scattered paranasal sinus mucosal thickening.   IMPRESSION: Streak/beam hardening artifact partially obscures the posterior fossa.   Within this limitation, no evidence of acute intracranial abnormality.   Mild chronic small vessel ischemic changes within the cerebral white matter.  CTA  head and neck (01/05/22): FINDINGS: CTA NECK FINDINGS   Aortic arch: Visualized aortic arch normal caliber with normal branch pattern. No stenosis about the origin the great vessels.   Right carotid system: Right common and internal carotid arteries widely patent without stenosis, dissection or occlusion. Mild atheromatous plaque about the right carotid bulb without stenosis.   Left carotid system: Left common and internal carotid arteries widely patent without stenosis, dissection or occlusion.   Vertebral arteries: Both vertebral arteries arise from the subclavian arteries. Right vertebral artery dominant, with a diffusely hypoplastic left vertebral artery. Vertebral arteries patent without stenosis or dissection.   Skeleton: No discrete or worrisome osseous lesions. Mild cervical spondylosis for age.   Other neck: 1.6 cm cystic lesion within the subcutaneous fat of the mid upper posterior neck, indeterminate, but likely a sebaceous cyst (series 5, image 77). Asymmetric fatty atrophy of the left parotid gland noted. No other acute soft tissue abnormality within the neck.   Upper chest: Left-sided pacemaker/AICD partially visualized. Visualized upper chest demonstrates no acute finding.   Review of the MIP images confirms the above findings   CTA HEAD FINDINGS   Anterior circulation: Petrous segments patent bilaterally. Mild atheromatous change within the carotid siphons without significant stenosis. A1 segments, anterior communicating artery complex common anterior cerebral arteries widely patent. No M1 stenosis or occlusion. No proximal MCA branch occlusion. Distal MCA branches perfused and symmetric.   Posterior circulation: Dominant right vertebral artery widely patent. Left vertebral artery largely terminates in PICA. Both PICA patent. Basilar patent to its distal aspect without stenosis. Superior cerebral arteries patent bilaterally. Both PCAs widely patent and well  perfused or distal aspects.   Venous sinuses: Patent allowing for timing the contrast bolus.   Anatomic variants: None significant.  No aneurysm.   Review of the MIP images confirms the above findings   IMPRESSION: 1. Negative CTA for large vessel occlusion or other emergent finding. 2. Mild for age atheromatous disease without hemodynamically significant or correctable stenosis. 3. 1.6 cm cystic lesion within the subcutaneous fat of the mid upper posterior neck, indeterminate, but likely a sebaceous cyst. Correlation with physical exam suggested.  CT lumbar spine wo contrast (10/24/21): FINDINGS: Segmentation: 5 lumbar-type vertebral bodies, with hypoplastic ribs at T12.   Alignment: Normal.   Vertebrae: No acute fracture or suspicious osseous lesion. Vertebral body heights are maintained.   Paraspinal and other soft tissues: Negative.   Disc levels:   T12-L1: Minimal disc bulge. Mild-to-moderate facet arthropathy. No spinal canal stenosis no neural foraminal narrowing.   L1-L2: Minimal disc bulge. Mild-to-moderate facet arthropathy. No spinal canal stenosis. Mild right neural foraminal narrowing.   L2-L3: Minimal disc bulge. Moderate facet arthropathy. Ligamentum flavum hypertrophy. No spinal canal stenosis. Mild bilateral neural foraminal narrowing.   L3-L4: Mild disc bulge. Right-greater-than-left mild-to-moderate facet arthropathy. Ligamentum flavum hypertrophy. Narrowing of the lateral recesses. No spinal canal stenosis. Mild bilateral neural foraminal narrowing.   L4-L5: Moderate disc bulge. Mild-to-moderate right-greater-than-left facet arthropathy. No spinal canal stenosis. Narrowing of the lateral recesses. Mild bilateral neural foraminal narrowing.   L5-S1: Minimal disc bulge. Moderate facet arthropathy. No spinal canal stenosis. Mild right neural foraminal narrowing.  IMPRESSION: 1. Multilevel facet arthropathy, which causes mild neural  foraminal narrowing bilaterally at L2-L3, L3-L4, and L4-L5, as well as on the right at L1-L2 and L5-S1. 2. No spinal canal stenosis. Narrowing of the lateral recesses at L3-L4 and L4-L5 could affect the descending L4 and L5 nerve roots, respectively.  EMG (12/18/21 by Dr. Ernestina Hernandez): EMG & NCV Findings (see tables and wave forms in Epic): Evaluation of the left tibial motor and the right superficial fibular sensory nerves showed reduced amplitude (L0.3, R2.4 V).  The right tibial motor nerve showed prolonged distal onset latency (6.3 ms) and reduced amplitude (0.3 mV).  The left superficial fibular sensory nerve showed prolonged distal peak latency (4.8 ms) and decreased conduction velocity (14 cm-Ant Lat Mall, 29 m/s).  The left sural sensory and the right sural sensory nerves showed no response (Calf).  All remaining nerves (as indicated in the following tables) were within normal limits.  Left vs. Right side comparison data for the tibial motor nerve indicates abnormal L-R latency difference (1.8 ms) and abnormal L-R velocity difference (Knee-Ankle, 11 m/s).     Needle evaluation of the left anterior tibialis and the left Fibularis Longus muscles showed increased insertional activity.  All remaining muscles (as indicated in the following table) showed no evidence of electrical instability.     Impression: The above electrodiagnostic study is ABNORMAL and reveals evidence of mild chronic L5 radiculopathy on the left.     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    There is no significant electrodiagnostic evidence of any other focal nerve entrapment or lumbosacral plexopathy.  --- **Per my read of the tables, the above mentioned neuropathy appears axonal in nature. I do not see a clear chronic L5 radiculopathy as mentioned above.**   ASSESSMENT: [name] is a 71 y.o. male who presents for evaluation of  stabbing and aching of bilateral legs and gait imbalance. He has a relevant medical history of DM, HTN, HLD, OA, prostate cancer (~2005 s/p surgery and radiation), OSA (on CPAP), cardiomyopathy c/b HFrEF s/p PM, TIA on 01/05/22. His neurological examination is pertinent for distal lower extremity sensory loss, positive Romberg, and gait imbalance. Available diagnostic data is significant for B12 of 974, HbA1c of 6.2 and a normal ferritin. 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 An overlapping restless leg syndrome is also possible. I will look for other treatable causes of neuropathy and recheck iron studies due to possible RLS.  PLAN: -Blood work: iron studies, IFE -Continue home PT exercise -Lidocaine cream -Can retry low dose gabapentin (100 mg at night)  -Return to clinic in 6 months  The impression above as well as the plan as outlined below were extensively discussed with the patient (in the company of wife) who voiced understanding. All questions were answered to their satisfaction.  The patient was counseled on pertinent fall precautions per the printed material provided today, and as noted under the "Patient Instructions" section below.  When available, results of the above investigations and possible further recommendations will be communicated to the patient via telephone/MyChart. Patient to call office if not contacted after expected testing turnaround time.   Total time spent reviewing records, interview, history/exam, documentation, and coordination of care on day of encounter:  55 min   Thank you for allowing me to participate in patient's care.  If I can answer any additional questions, I would be pleased  to do so.  Kai Levins, MD   CC: Binnie Rail, [place] [id number]  CC: Referring provider: Garald Hernandez, [place],   [id number]

## 2022-02-11 ENCOUNTER — Encounter: Payer: Self-pay | Admitting: Neurology

## 2022-02-11 ENCOUNTER — Ambulatory Visit: Payer: Medicare PPO | Admitting: Neurology

## 2022-02-11 ENCOUNTER — Other Ambulatory Visit (INDEPENDENT_AMBULATORY_CARE_PROVIDER_SITE_OTHER): Payer: Medicare PPO

## 2022-02-11 VITALS — BP 127/82 | HR 73 | Ht 71.0 in | Wt 193.0 lb

## 2022-02-11 DIAGNOSIS — E1142 Type 2 diabetes mellitus with diabetic polyneuropathy: Secondary | ICD-10-CM | POA: Diagnosis not present

## 2022-02-11 DIAGNOSIS — R209 Unspecified disturbances of skin sensation: Secondary | ICD-10-CM | POA: Diagnosis not present

## 2022-02-11 DIAGNOSIS — G2581 Restless legs syndrome: Secondary | ICD-10-CM

## 2022-02-11 DIAGNOSIS — R2681 Unsteadiness on feet: Secondary | ICD-10-CM | POA: Diagnosis not present

## 2022-02-11 NOTE — Patient Instructions (Signed)
I saw you today for pain in your legs and difficulty walking. The symptoms are likely because of neuropathy. The cause of your neuropathy is likely diabetes, but I want to do some more lab work today to look for other treatable causes of neuropathy.  Continue to do the home exercises given to you by physical therapy.  Continue lidocaine cream as needed for leg and foot pain.  You can retry gabapentin to see if you do better with it. If you get worse, you can stop it.  I will be in touch when I have the results of your tests. If you want to try another medication, we can discuss it at that time.  I would like to see you back in clinic in 6 months or sooner if needed.  The physicians and staff at [facility] are committed to providing excellent care. You may receive a survey requesting feedback about your experience at our office. We strive to receive "very good" responses to the survey questions. If you feel that your experience would prevent you from giving the office a "very good " response, please contact our office to try to remedy the situation. We may be reached at [phone]. Thank you for taking the time out of your busy day to complete the survey.  Kai Levins, MD [facility]  Preventing Falls at [facility] are common, often dreaded events in the lives of older people. Aside from the obvious injuries and even death that may result, fall can cause wide-ranging consequences including loss of independence, mental decline, decreased activity and mobility. Younger people are also at risk of falling, especially those with chronic illnesses and fatigue.  Ways to reduce risk for falling Examine diet and medications. Warm foods and alcohol dilate blood vessels, which can lead to dizziness when standing. Sleep aids, antidepressants and pain medications can also increase the likelihood of a fall.  Get a vision exam. Poor vision, cataracts and glaucoma increase the chances of  falling.  Check foot gear. Shoes should fit snugly and have a sturdy, nonskid sole and a broad, low heel  Participate in a physician-approved exercise program to build and maintain muscle strength and improve balance and coordination. Programs that use ankle weights or stretch bands are excellent for muscle-strengthening. Water aerobics programs and low-impact Tai Chi programs have also been shown to improve balance and coordination.  Increase vitamin D intake. Vitamin D improves muscle strength and increases the amount of calcium the body is able to absorb and deposit in bones.  How to prevent falls from common hazards Floors - Remove all loose wires, cords, and throw rugs. Minimize clutter. Make sure rugs are anchored and smooth. Keep furniture in its usual place.  Chairs -- Use chairs with straight backs, armrests and firm seats. Add firm cushions to existing pieces to add height.  Bathroom - Install grab bars and non-skid tape in the tub or shower. Use a bathtub transfer bench or a shower chair with a back support Use an elevated toilet seat and/or safety rails to assist standing from a low surface. Do not use towel racks or bathroom tissue holders to help you stand.  Lighting - Make sure halls, stairways, and entrances are well-lit. Install a night light in your bathroom or hallway. Make sure there is a light switch at the top and bottom of the staircase. Turn lights on if you get up in the middle of the night. Make sure lamps or light switches are within reach  of the bed if you have to get up during the night.  Kitchen - Install non-skid rubber mats near the sink and stove. Clean spills immediately. Store frequently used utensils, pots, pans between waist and eye level. This helps prevent reaching and bending. Sit when getting things out of lower cupboards.  Living room/ Bedrooms - Place furniture with wide spaces in between, giving enough room to move around. Establish a route through the  living room that gives you something to hold onto as you walk.  Stairs - Make sure treads, rails, and rugs are secure. Install a rail on both sides of the stairs. If stairs are a threat, it might be helpful to arrange most of your activities on the lower level to reduce the number of times you must climb the stairs.  Entrances and doorways - Install metal handles on the walls adjacent to the doorknobs of all doors to make it more secure as you travel through the doorway.  Tips for maintaining balance Keep at least one hand free at all times. Try using a backpack or fanny pack to hold things rather than carrying them in your hands. Never carry objects in both hands when walking as this interferes with keeping your balance.  Attempt to swing both arms from front to back while walking. This might require a conscious effort if Parkinson's disease has diminished your movement. It will, however, help you to maintain balance and posture, and reduce fatigue.  Consciously lift your feet off of the ground when walking. Shuffling and dragging of the feet is a common culprit in losing your balance.  When trying to navigate turns, use a "U" technique of facing forward and making a wide turn, rather than pivoting sharply.  Try to stand with your feet shoulder-length apart. When your feet are close together for any length of time, you increase your risk of losing your balance and falling.  Do one thing at a time. Don't try to walk and accomplish another task, such as reading or looking around. The decrease in your automatic reflexes complicates motor function, so the less distraction, the better.  Do not wear rubber or gripping soled shoes, they might "catch" on the floor and cause tripping.  Move slowly when changing positions. Use deliberate, concentrated movements and, if needed, use a grab bar or walking aid. Count 15 seconds between each movement. For example, when rising from a seated position, wait 15  seconds after standing to begin walking.  If balance is a continuous problem, you might want to consider a walking aid such as a cane, walking stick, or walker. Once you've mastered walking with help, you might be ready to try it on your own again.

## 2022-02-14 ENCOUNTER — Other Ambulatory Visit: Payer: Self-pay | Admitting: Internal Medicine

## 2022-02-16 ENCOUNTER — Encounter: Payer: Self-pay | Admitting: Neurology

## 2022-02-16 LAB — IRON,TIBC AND FERRITIN PANEL
%SAT: 33 % (calc) (ref 20–48)
Ferritin: 124 ng/mL (ref 24–380)
Iron: 94 ug/dL (ref 50–180)
TIBC: 285 mcg/dL (calc) (ref 250–425)

## 2022-02-16 LAB — IMMUNOFIXATION ELECTROPHORESIS
IgG (Immunoglobin G), Serum: 1754 mg/dL — ABNORMAL HIGH (ref 600–1540)
IgM, Serum: 50 mg/dL (ref 50–300)
Immunoglobulin A: 169 mg/dL (ref 70–320)

## 2022-02-23 ENCOUNTER — Ambulatory Visit: Payer: Medicare PPO | Admitting: Podiatry

## 2022-02-23 ENCOUNTER — Encounter: Payer: Self-pay | Admitting: Podiatry

## 2022-02-23 DIAGNOSIS — M79675 Pain in left toe(s): Secondary | ICD-10-CM | POA: Diagnosis not present

## 2022-02-23 DIAGNOSIS — B351 Tinea unguium: Secondary | ICD-10-CM

## 2022-02-23 DIAGNOSIS — M79674 Pain in right toe(s): Secondary | ICD-10-CM

## 2022-02-23 DIAGNOSIS — E0842 Diabetes mellitus due to underlying condition with diabetic polyneuropathy: Secondary | ICD-10-CM

## 2022-02-23 HISTORY — DX: Tinea unguium: B35.1

## 2022-02-23 HISTORY — DX: Pain in right toe(s): M79.674

## 2022-02-23 NOTE — Progress Notes (Unsigned)
History of Present Illness [name] is a 71 y.o. male never smoker with OSA on CPAP. PMH includes chronic systolic heart failure status post AICD for VT, hypertension, diabetes type 2, prostate cancer, Restless leg syndrome and vascular dementia. He is followed by Dr. Elsworth Soho    Pt. Presents for follow up of OSA and restless legs. OSA was diagnosed in 2005 but he could not tolerate CPAP. He has lost 40 pounds since getting COVID in 2022. Dr. Elsworth Soho had him re-evaluated with a split night study 08/2021 which showed a low AHI but high RDI with predominant RERAs. Because of this, Dr. Elsworth Soho has asked that he continue CPAP. He had noted improved compliance at his last visit 11/2021.At that time his wife did note a large leak. He stated he felt better rested , but per his wife, he was still needing to nap on occasion. He continued to have memory issues.  He was taking gabapentin BID, but was not sure at the time that it had helped his legs. Dr. Elsworth Soho also decreased pressures to 5-10 cm H2O at his July appointment. Compliance was 5 hours each night.   He presents today for follow up to evaluate how he is doing on the lower  pressures. He has been doing well. He has been using his machine 87% of the time. Settings are AutoSet 5-10 cm H2O. Greater than 4 hours 83% of the time. Median pressure is 6.6 cm H2O, leaks are well controlled ar median of 3.7. He denies any morning headaches, denies any significant daytime sleepiness . AHI is 2.7. He is doing well. Average usage days worn is 7-8 hours.   Pt. BP was elevated today in the office. Patient had not taken his BP medication yet, as he needed to eat first. He told me he would take this as soon as he gets home.His wife told me she would make sure he took it this morning.   Pt. States he has periods of time where he does not feel good air movement through his CPAP. I have asked him to call [employer] to evaluate the ramp up pressures if this continues to be an issue. Per  downLoad the device appears to be working well.   Test Results: Down Load 9/16-10/15/2023      Significant tests/ events reviewed   08/2021 split-night study mild OSA AHI 9.5/hour, RDI 50/hour, minimum desaturation 90%  01/2004 NPSG - wt 235 lbs -RDI 37/hour corrected by CPAP 7 cm     Latest Ref Rng & Units 01/06/2022    5:36 AM 01/05/2022    6:10 PM 12/19/2021    4:16 PM  CBC  WBC 4.0 - 10.5 K/uL 5.8  7.1  6.3   Hemoglobin 13.0 - 17.0 g/dL 12.3  12.0  11.9   Hematocrit 39.0 - 52.0 % 38.2  38.8  37.4   Platelets 150 - 400 K/uL 218  226  215.0        Latest Ref Rng & Units 01/06/2022    5:36 AM 01/05/2022    6:10 PM 12/02/2021    3:30 PM  BMP  Glucose 70 - 99 mg/dL 93  102  93   BUN 8 - 23 mg/dL '8  11  8   '$ Creatinine 0.61 - 1.24 mg/dL 0.87  0.93  1.07   BUN/Creat Ratio 10 - 24   7   Sodium 135 - 145 mmol/L 140  139  144   Potassium 3.5 - 5.1 mmol/L 3.5  4.2  4.3   Chloride 98 - 111 mmol/L 105  107  106   CO2 22 - 32 mmol/L '28  28  26   '$ Calcium 8.9 - 10.3 mg/dL 9.4  9.0  9.6     BNP No results found for: "BNP"  ProBNP    Component Value Date/Time   PROBNP 19.0 11/14/2020 1204    PFT No results found for: "FEV1PRE", "FEV1POST", "FVCPRE", "FVCPOST", "TLC", "DLCOUNC", "PREFEV1FVCRT", "PSTFEV1FVCRT"  CARDIAC EVENT MONITOR  Result Date: 02/22/2022 NSR with sinus bradycardia Frequent episodes of brief NSVT, of different PVC morphologies No prolonged pauses Brief NS SVT No atrial fib Salome Spotted     Past medical hx 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   Not compatible with MRI    Barrett's esophagus 09/05/2004   2 cm changes seen at index EGD   Cataract    Chronic back pain 54/00/8676   Chronic systolic heart failure    Colon polyps    tubular adenoma   Dysrhythmia    Essential hypertension 08/26/2009   Fatigue 12/02/2020    Generalized anxiety disorder    since defibrillator placement   GERD (gastroesophageal reflux disease)    Hip pain 12/14/2016   History of COVID-19    History of myocardial infarction    Hyperlipidemia    Left knee pain 12/14/2016   Malignant neoplasm of prostate 09/27/2014   s/p prostatectomy   Mild anemia 01/20/2011   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 b. s/p [employer] ICD.   Obstructive sleep apnea 11/10/2012   no CPAP use   Right foot pain 12/15/2017   RLS (restless legs syndrome) 11/29/2012   S/P radiation therapy    10/10/2014 through 11/26/2014; Prostate bed 6600 cGy in 33 sessions   Shingles 07/28/2016   Type II diabetes mellitus 08/26/2009   Ventricular tachycardia ([place])    a. s/p ICD (generator change 09/2012). b. 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 and placed on sotalol   Vitamin B 12 deficiency 03/06/2011   Monthly B12 shots initiated  2011. Nasally  inhaled B12 as of 07/2012     Social History   Tobacco Use   Smoking status: Never   Smokeless tobacco: Never  Vaping Use   Vaping Use: Never used  Substance Use Topics   Alcohol use: No   Drug use: No    [name] reports that he has never smoked. He has never used smokeless tobacco. He reports that he does not drink alcohol and does not use drugs.  Tobacco Cessation: Never smoker   Past surgical hx, Family hx, Social hx all reviewed.  Current Outpatient Medications on File Prior to Visit  Medication Sig   ACCU-CHEK GUIDE test strip USE TO CHECK SUGAR 4 TIMES A DAY   acetaminophen (TYLENOL) 500 MG tablet Take 1,000 mg by mouth every 6 (six) hours as needed for mild pain.   atorvastatin (LIPITOR) 20 MG tablet TAKE 1 TABLET DAILY EXCEPT TAKE 1/2 TABLET ON TUESDAY, THURSDAY, AND SATURDAYS   carvedilol (COREG) 25 MG tablet TAKE 1 TABLET TWICE DAILY WITH MEALS   Cholecalciferol (VITAMIN D) 50 MCG (2000 UT) tablet Take 2,000  Units by mouth daily.   clopidogrel (PLAVIX) 75 MG tablet Take 1 tablet (75 mg total) by mouth daily.   digoxin (LANOXIN)  0.125 MG tablet Take 0.5 tablets (0.0625 mg total) by mouth daily.   docusate sodium (COLACE) 100 MG capsule Take 1 capsule (100 mg total) by mouth 2 (two) times daily as needed for mild constipation.   donepezil (ARICEPT) 10 MG tablet TAKE HALF TABLET (5 MG) DAILY FOR 2 WEEKS, THEN INCREASE TO THE FULL TABLET AT 10 MG DAILY (Patient taking differently: Take 10 mg by mouth daily.)   famotidine (PEPCID) 20 MG tablet Take 1 tablet (20 mg total) by mouth daily.   JANUMET 50-1000 MG tablet TAKE 1 TABLET EVERY DAY WITH A MEAL   Lancets (ONETOUCH ULTRASOFT) lancets Use as instructed to test sugar daily and prn. Dx E11.9   losartan (COZAAR) 25 MG tablet Take 1 tablet (25 mg total) by mouth at bedtime.   pantoprazole (PROTONIX) 40 MG tablet Take 1 tablet (40 mg total) by mouth daily.   sotalol (BETAPACE) 120 MG tablet TAKE 1 TABLET TWICE DAILY (Patient taking differently: Take 120 mg by mouth 2 (two) times daily.)   traMADol (ULTRAM) 50 MG tablet Take 1 tablet (50 mg total) by mouth every 6 (six) hours as needed. (Patient taking differently: Take 50 mg by mouth every 6 (six) hours as needed for moderate pain.)   vitamin B-12 (CYANOCOBALAMIN) 1000 MCG tablet TAKE 1 TABLET EVERY DAY   gabapentin (NEURONTIN) 100 MG capsule Take 1 capsule (100 mg total) by mouth 2 (two) times daily. (Patient not taking: Reported on 02/24/2022)   No current facility-administered medications on file prior to visit.     Allergies  Allergen Reactions   Zantac [Ranitidine Hcl]     itching    Review Of Systems:  Constitutional:   No  weight loss, night sweats,  Fevers, chills, fatigue, or  lassitude.  HEENT:   No headaches,  Difficulty swallowing,  Tooth/dental problems, or  Sore throat,                No sneezing, itching, ear ache, nasal congestion, post nasal drip,   CV:  No chest pain,   Orthopnea, PND, swelling in lower extremities, anasarca, dizziness, palpitations, syncope.   GI  No heartburn, indigestion, abdominal pain, nausea, vomiting, diarrhea, change in bowel habits, loss of appetite, bloody stools.   Resp: No shortness of breath with exertion or at rest.  No excess mucus, no productive cough,  No non-productive cough,  No coughing up of blood.  No change in color of mucus.  No wheezing.  No chest wall deformity  Skin: no rash or lesions.  GU: no dysuria, change in color of urine, no urgency or frequency.  No flank pain, no hematuria   MS:  No joint pain or swelling.  No decreased range of motion.  No back pain.  Psych:  No change in mood or affect. No depression or anxiety.  No memory loss.  Neuro: Forgetful   Vital Signs BP (!) 152/82 (BP Location: Left Arm, Cuff Size: Normal) Comment: Not taken medicine this am  Pulse 66   Temp (!) 97.5 F (36.4 C) (Temporal)   Ht '5\' 11"'$  (1.803 m)   Wt 189 lb 3.2 oz (85.8 kg)   SpO2 99%   BMI 26.39 kg/m    Physical Exam:  General- No distress,  A&Ox3, pleasant, forgetful ENT: No sinus tenderness, TM clear, pale nasal mucosa, no oral exudate,no post nasal drip, no LAN Cardiac: S1, S2, regular rate and rhythm, no murmur Chest: No wheeze/ rales/ dullness; no accessory muscle use, no nasal  flaring, no sternal retractions Abd.: Soft Non-tender, ND, BS +, Body mass index is 26.39 kg/m. Ext: No clubbing cyanosis, edema Neuro:  normal strength, MAE x 4, A&O x 3 Skin: No rashes, warm and dry, NO lesions  Psych: normal mood and behavior   Assessment/Plan  OSA on CPAP Plan You are doing well on your CPAP.  If you continue to feel your CPAP pressures are low please call [employer] and have them look at your CPAP device.  Continue on CPAP at bedtime. You appear to be benefiting from the treatment  Goal is to wear for at least 4-6 hours each night for maximal clinical benefit. Wear as long as you are sleeping.  Continue  to work on weight loss, as the link between excess weight  and sleep apnea is well established.   Remember to establish a good bedtime routine, and work on sleep hygiene.  Limit daytime naps , avoid stimulants such as caffeine and nicotine close to bedtime, exercise daily to promote sleep quality, avoid heavy , spicy, fried , or rich foods before bed. Ensure adequate exposure to natural light during the day,establish a relaxing bedtime routine with a pleasant sleep environment ( Bedroom between 60 and 67 degrees, turn off bright lights , TV or device screens screens , consider black out curtains or white noise machines) Do not drive if sleepy. Remember to clean mask, tubing, filter, and reservoir once weekly with soapy water.  Follow up with  Dr. Elsworth Soho   In 4 month or before as needed.   Call if you need [place] sooner.  You are doing great. Please contact office for sooner follow up if symptoms do not improve or worsen or seek emergency care    BP Elevated in the office today Pt. Had not taken anti-hypertensive's today   Counseling completed re: daily use of BP meds/ medication compliance.  Plan Per wife, will ensure he takes his BP medication as soon as he gets home. Follow up with PCP for nay concerns  I spent 40 minutes dedicated to the care of this patient on the date of this encounter to include pre-visit review of records, face-to-face time with the patient discussing conditions above, post visit ordering of testing, clinical documentation with the electronic health record, making appropriate referrals as documented, and communicating necessary information to the patient's healthcare team.      Magdalen Spatz, NP 02/25/2022  1:31 PM

## 2022-02-23 NOTE — Progress Notes (Signed)
This patient presents to the office with chief complaint of long thick nails and diabetic feet.  This patient  says there  is  no pain and discomfort in their feet.  This patient says there are long thick painful nails.  These nails are painful walking and wearing shoes.  Patient has no history of infection or drainage from both feet.  Patient is unable to  self treat his own nails .He presents to the office with his wife. This patient presents  to the office today for treatment of the  long nails and a foot evaluation due to history of  diabetes.  General Appearance  Alert, conversant and in no acute stress.  Vascular  Dorsalis pedis and posterior tibial  pulses are palpable  bilaterally.  Capillary return is within normal limits  bilaterally. Temperature is within normal limits  bilaterally.  Neurologic  Senn-Weinstein monofilament wire test within normal limits right.  LOPS diminished left foot. Muscle power within normal limits bilaterally.  Nails Thick disfigured discolored nails with subungual debris  from hallux to fifth toes bilaterally. No evidence of bacterial infection or drainage bilaterally.  Orthopedic  No limitations of motion of motion feet .  No crepitus or effusions noted.  HAV  B/L.  Hammer toes  2-5  B/L.  Skin  normotropic skin with no porokeratosis noted bilaterally.  No signs of infections or ulcers noted.     Onychomycosis  Diabetes with no foot complications  IE  Debride nails x 10 with nail nipper and dremel tool.  A diabetic foot exam was performed and there is no evidence of any vascular or neurologic pathology.   RTC 3 months.   Gardiner Barefoot DPM

## 2022-02-24 ENCOUNTER — Encounter: Payer: Self-pay | Admitting: Acute Care

## 2022-02-24 ENCOUNTER — Other Ambulatory Visit (HOSPITAL_BASED_OUTPATIENT_CLINIC_OR_DEPARTMENT_OTHER): Payer: Self-pay

## 2022-02-24 ENCOUNTER — Ambulatory Visit: Payer: Medicare PPO | Admitting: Acute Care

## 2022-02-24 VITALS — BP 152/82 | HR 66 | Temp 97.5°F | Ht 71.0 in | Wt 189.2 lb

## 2022-02-24 DIAGNOSIS — I10 Essential (primary) hypertension: Secondary | ICD-10-CM

## 2022-02-24 DIAGNOSIS — G4733 Obstructive sleep apnea (adult) (pediatric): Secondary | ICD-10-CM

## 2022-02-24 MED ORDER — COMIRNATY 30 MCG/0.3ML IM SUSY
PREFILLED_SYRINGE | INTRAMUSCULAR | 0 refills | Status: DC
Start: 2022-02-24 — End: 2022-07-13
  Filled 2022-02-24: qty 0.3, 1d supply, fill #0

## 2022-02-24 MED ORDER — FLUAD QUADRIVALENT 0.5 ML IM PRSY
PREFILLED_SYRINGE | INTRAMUSCULAR | 0 refills | Status: DC
  Filled 2022-02-24: qty 0.5, 1d supply, fill #0

## 2022-02-24 NOTE — Patient Instructions (Signed)
It is good to see you today. You are doing well on your CPAP.  If you continue to feel your CPAP pressures are low please call [employer] and have them look at your CPAP device.  Continue on CPAP at bedtime. You appear to be benefiting from the treatment  Goal is to wear for at least 4-6 hours each night for maximal clinical benefit. Wear as long as you are sleeping.  Continue to work on weight loss, as the link between excess weight  and sleep apnea is well established.   Remember to establish a good bedtime routine, and work on sleep hygiene.  Limit daytime naps , avoid stimulants such as caffeine and nicotine close to bedtime, exercise daily to promote sleep quality, avoid heavy , spicy, fried , or rich foods before bed. Ensure adequate exposure to natural light during the day,establish a relaxing bedtime routine with a pleasant sleep environment ( Bedroom between 60 and 67 degrees, turn off bright lights , TV or device screens screens , consider black out curtains or white noise machines) Do not drive if sleepy. Remember to clean mask, tubing, filter, and reservoir once weekly with soapy water.  Follow up with  Dr. Elsworth Soho   In 4 month or before as needed.   Call if you need [place] sooner.  You are doing great. Please contact office for sooner follow up if symptoms do not improve or worsen or seek emergency care

## 2022-02-25 ENCOUNTER — Encounter: Payer: Self-pay | Admitting: Acute Care

## 2022-02-25 ENCOUNTER — Encounter: Payer: Self-pay | Admitting: Internal Medicine

## 2022-02-27 ENCOUNTER — Encounter: Payer: Self-pay | Admitting: Internal Medicine

## 2022-02-27 ENCOUNTER — Telehealth: Payer: Self-pay | Admitting: Internal Medicine

## 2022-02-27 NOTE — Telephone Encounter (Signed)
Received medical clearance for dental treatment through fax. Forms printed and placed in Dr. Quay Burow box at the front.

## 2022-03-02 NOTE — Telephone Encounter (Signed)
Received and placed in Dr. Melburn Popper folder for review.

## 2022-03-05 ENCOUNTER — Encounter: Payer: Medicare PPO | Admitting: Internal Medicine

## 2022-03-05 NOTE — Telephone Encounter (Signed)
Faxed today

## 2022-03-09 NOTE — Progress Notes (Signed)
Kindly inform the patient that 30-day heart monitor study did not show any evidence of atrial fibrillation or any worrisome arrhythmia.

## 2022-03-17 ENCOUNTER — Ambulatory Visit: Payer: Medicare PPO | Admitting: Podiatry

## 2022-03-19 ENCOUNTER — Ambulatory Visit (INDEPENDENT_AMBULATORY_CARE_PROVIDER_SITE_OTHER): Payer: Medicare PPO

## 2022-03-19 DIAGNOSIS — I428 Other cardiomyopathies: Secondary | ICD-10-CM

## 2022-03-19 LAB — CUP PACEART REMOTE DEVICE CHECK
Battery Remaining Longevity: 33 mo
Battery Voltage: 2.95 V
Brady Statistic RV Percent Paced: 0.02 %
Date Time Interrogation Session: 20231109022825
HighPow Impedance: 59 Ohm
Implantable Lead Connection Status: 753985
Implantable Lead Implant Date: 20140530
Implantable Lead Location: 753860
Implantable Lead Model: 6935
Implantable Pulse Generator Implant Date: 20140530
Lead Channel Impedance Value: 361 Ohm
Lead Channel Impedance Value: 456 Ohm
Lead Channel Pacing Threshold Amplitude: 0.625 V
Lead Channel Pacing Threshold Pulse Width: 0.4 ms
Lead Channel Sensing Intrinsic Amplitude: 6.25 mV
Lead Channel Sensing Intrinsic Amplitude: 6.25 mV
Lead Channel Setting Pacing Amplitude: 2.5 V
Lead Channel Setting Pacing Pulse Width: 0.4 ms
Lead Channel Setting Sensing Sensitivity: 0.3 mV

## 2022-03-24 ENCOUNTER — Ambulatory Visit: Payer: Medicare PPO | Admitting: Podiatry

## 2022-03-27 NOTE — Progress Notes (Signed)
Remote ICD transmission.   

## 2022-04-01 ENCOUNTER — Ambulatory Visit: Payer: Medicare PPO | Admitting: Neurology

## 2022-04-01 ENCOUNTER — Other Ambulatory Visit: Payer: Self-pay | Admitting: Internal Medicine

## 2022-04-09 ENCOUNTER — Other Ambulatory Visit: Payer: Self-pay | Admitting: Internal Medicine

## 2022-04-09 MED ORDER — CLOPIDOGREL BISULFATE 75 MG PO TABS: 75.0000 mg | ORAL_TABLET | Freq: Every day | ORAL | 2 refills | Status: DC

## 2022-04-09 NOTE — Telephone Encounter (Signed)
Pt is requesting a refill on pantoprazole. Would Dr. Lovena Le like to refill this medication? Please address

## 2022-04-10 ENCOUNTER — Encounter: Payer: Self-pay | Admitting: Internal Medicine

## 2022-04-12 MED ORDER — PANTOPRAZOLE SODIUM 40 MG PO TBEC: 40.0000 mg | DELAYED_RELEASE_TABLET | Freq: Every day | ORAL | 0 refills | Status: DC

## 2022-04-12 MED ORDER — PANTOPRAZOLE SODIUM 40 MG PO TBEC: 40.0000 mg | DELAYED_RELEASE_TABLET | Freq: Every day | ORAL | 1 refills | Status: DC

## 2022-04-13 ENCOUNTER — Encounter: Payer: Self-pay | Admitting: Internal Medicine

## 2022-04-27 ENCOUNTER — Ambulatory Visit: Payer: Medicare PPO | Admitting: Psychology

## 2022-04-27 ENCOUNTER — Encounter: Payer: Self-pay | Admitting: Psychology

## 2022-04-27 ENCOUNTER — Ambulatory Visit: Payer: Medicare PPO

## 2022-04-27 DIAGNOSIS — G309 Alzheimer's disease, unspecified: Secondary | ICD-10-CM

## 2022-04-27 DIAGNOSIS — F0280 Dementia in other diseases classified elsewhere without behavioral disturbance: Secondary | ICD-10-CM

## 2022-04-27 DIAGNOSIS — I6781 Acute cerebrovascular insufficiency: Secondary | ICD-10-CM | POA: Diagnosis not present

## 2022-04-27 DIAGNOSIS — F0150 Vascular dementia without behavioral disturbance: Secondary | ICD-10-CM | POA: Insufficient documentation

## 2022-04-27 DIAGNOSIS — R4189 Other symptoms and signs involving cognitive functions and awareness: Secondary | ICD-10-CM

## 2022-04-27 DIAGNOSIS — R441 Visual hallucinations: Secondary | ICD-10-CM | POA: Diagnosis not present

## 2022-04-27 HISTORY — DX: Dementia in other diseases classified elsewhere, unspecified severity, without behavioral disturbance, psychotic disturbance, mood disturbance, and anxiety: F01.50

## 2022-04-27 NOTE — Progress Notes (Signed)
   Psychometrician Note   Cognitive testing was administered to [name] by Cruzita Lederer, B.S. (psychometrist) under the supervision of Dr. Christia Reading, Ph.D., licensed psychologist on 04/27/2022. Mr. Graziani did not appear overtly distressed by the testing session per behavioral observation or responses across self-report questionnaires. Rest breaks were offered.    The battery of tests administered was selected by Dr. Christia Reading, Ph.D. with consideration to Mr. Grether's current level of functioning, the nature of his symptoms, emotional and behavioral responses during interview, level of literacy, observed level of motivation/effort, and the nature of the referral question. This battery was communicated to the psychometrist. Communication between Dr. Christia Reading, Ph.D. and the psychometrist was ongoing throughout the evaluation and Dr. Christia Reading, Ph.D. was immediately accessible at all times. Dr. Christia Reading, Ph.D. provided supervision to the psychometrist on the date of this service to the extent necessary to assure the quality of all services provided.    [name] will return within approximately 1-2 weeks for an interactive feedback session with Dr. Melvyn Novas at which time his test performances, clinical impressions, and treatment recommendations will be reviewed in detail. Mr. Ator understands he can contact our office should he require our assistance before this time.  A total of 160 minutes of billable time were spent face-to-face with Mr. Bonner by the psychometrist. This includes both test administration and scoring time. Billing for these services is reflected in the clinical report generated by Dr. Christia Reading, Ph.D.  This note reflects time spent with the psychometrician and does not include test scores or any clinical interpretations made by Dr. Melvyn Novas. The full report will follow in a separate note.

## 2022-04-27 NOTE — Progress Notes (Signed)
NEUROPSYCHOLOGICAL EVALUATION . [facility]  Date of Evaluation: April 27, 2022  Reason for Referral:   [name] is a 71 y.o. right-handed African-American male referred by Sharene Butters, PA-C, to characterize his current cognitive functioning and assist with diagnostic clarity and treatment planning in the context of a prior diagnosis of mild cognitive impairment with concerns for progressive decline.   Assessment and Plan:   Clinical Impression(s): [name]'s pattern of performance is suggestive of fairly diffuse cognitive dysfunction, with ongoing impairment surrounding domains of processing speed, complex attention, executive functioning, receptive language, and essentially all aspects of learning and memory. Visuospatial abilities exhibited some performance variability but were also largely below expectation. Performances were adequate relative to age-matched peers across basic attention, safety/judgment, and expressive language. Regarding day-to-day functioning, [name]'s wife has fully taken over medication, financial, and bill paying responsibilities. She also noted that he has trouble operating some devices at home and can no longer perform various household maintenance he used to do with ease. While he continues to drive, this has been greatly limited and he has expressed concerns surrounding him driving long distances to his wife. Given ongoing cognitive and functional impairment, I feel he best meets diagnostic criteria for a Major Neurocognitive Disorder ("dementia") at the present time.  Relative to his previous evaluation in December 2022, cognitive decline was exhibited across receptive language, clock drawing, essentially all aspects of visual memory, and delayed retrieval aspects of verbal memory. Outside of this, the majority of cognitive domains exhibited relative stability. However, it is important to highlight that  this generally suggests stable impairment rather than intact performances.  The underlying etiology continues to be difficult to pinpoint. 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 Medically speaking, [name] has a very large degree of medical ailments which would impact cerebrovascular functioning (e.g., hypertension, ventricular tachycardia, cardiomyopathy, chronic heart failure, aortic atherosclerosis, hyperlipidemia, prior heart attack, type II diabetes, and obstructive sleep apnea). Neuroimaging in the form of a head CT (his pacemaker prevents MRI modalities) in the context of a recently experienced TIA did not reveal an acute stroke but did suggest ongoing microvascular ischemic disease of unspecified severity. Significant cerebrovascular dysfunction can certainly impact cognitive functioning, particularly surrounding processing speed, complex attention, executive functioning, and encoding/retrieval aspects of memory.  Regarding concerns for Lewy body disease, testing patterns do suggest impairment surrounding executive functioning and visuospatial abilities. However, it is difficult to determine how much of this is impacted by vascular contributions or another illness (see below).  He does exhibit fully formed hallucinations which is certainly a common component of this illness. There have also been concerns surrounding infrequent REM sleep behaviors in the past; however, his wife reported that these behaviors have diminished/improved over the past year and may be related to restless leg syndrome. Hallucinations can sometimes occur in Alzheimer's disease and this experience is certainly not pathognomic of Lewy body disease. He continues to not report the emergence of other parkinsonian symptoms.  Across memory testing, [name] generally did not benefit from repeated exposure to information and was nearly amnestic across all memory tasks with retention  rates ranging from 0% (both list and story tasks) to 17%. He also generally performed poorly across yes/no recognition tasks. Taken together, this suggests concern for rapid forgetting and an evolving and already quite significant memory storage impairment, which are the hallmark characteristic of Alzheimer's disease. Evidence for objective progressive memory decline over  the past 12 months is also quite concerning as this pattern of rapid forgetting and progressive worsening would not be expected from a purely vascular dementia presentation. Visuospatial dysfunction can certainly be seen in this illness. It is encouraging that semantic fluency and confrontation naming performances remained stable and appropriate. This would suggest that Alzheimer's disease, if truly present, would be in early stages at the present time.   At the current time, the presence of a neurodegenerative illness (creating a mixed dementia presentation) seems quite likely. However, I am unable to more conclusively determine which illness remains more likely (i.e., Alzheimer's disease versus Lewy body disease) as there remains much symptom overlap between these conditions. Continued medical monitoring will be important moving forward.  Recommendations: If he or his medical team have a strong desire to rule in our out a Lewy body disease component to his current clinical presentation, they could discuss the pros and cons of a DaTscan (assuming there is no contraindication with his pacemaker) with Ms. Wertman.   [name] has already been prescribed a medication aimed to address memory loss and cognitive decline (i.e., donepezil/Aricept). He is encouraged to continue taking this medication as prescribed. It is important to highlight that this medication has been shown to slow functional decline in some individuals. There is no current treatment which can stop or reverse cognitive decline when caused by a neurodegenerative illness.   If  not already done, he may also wish to discuss medications to help manage hallucinations with Ms. Wertman or another member of his medical team.   Performance across neurocognitive testing is not a strong predictor of an individual's safety operating a motor vehicle. Should his family wish to pursue a formalized driving evaluation, they could reach out to the following agencies: [employer] in [place]: [phone] [facility]: [facility]: [place]: [phone] or [phone]  It will be important for him to have another person with him when in situations where he may need to process information, weigh the pros and cons of different options, and make decisions, in order to ensure that he fully understands and recalls all information to be considered.  [name] is encouraged to attend to lifestyle factors for brain health (e.g., regular physical exercise, good nutrition habits, regular participation in cognitively-stimulating activities, and general stress management techniques), which are likely to have benefits for both emotional adjustment and cognition. Optimal control of vascular risk factors (including safe cardiovascular exercise and adherence to dietary recommendations) is encouraged. Likewise, continued compliance with his CPAP machine will also be important. Continued participation in activities which provide mental stimulation and social interaction is also recommended.  Important information should be provided to [name] in written format in all instances. This information should be placed in a highly frequented and easily visible location within his home to promote recall. External strategies such as written notes in a consistently used memory journal, visual and nonverbal auditory cues such as a calendar on the refrigerator or appointments with alarm, such as on a cell phone, can also help maximize  recall.  To address problems with processing speed, he may wish to consider:   -Ensuring that he is alerted when essential material or instructions are being presented   -Adjusting the speed at which new information is presented   -Allowing for more time in comprehending, processing, and responding in conversation  To address problems with fluctuating attention, he may wish to consider:   -Avoiding external distractions when  needing to concentrate   -Limiting exposure to fast paced environments with multiple sensory demands   -Writing down complicated information and using checklists   -Attempting and completing one task at a time (i.e., no multi-tasking)   -Verbalizing aloud each step of a task to maintain focus   -Taking frequent breaks during the completion of steps/tasks to avoid fatigue   -Reducing the amount of information considered at one time  Review of Records:   [name] was seen by [facility] Sharene Butters, PA-C) on 12/05/2020 for an evaluation of memory loss. Memory concerns were said to be present for the past six months. He stated "it is not like it used to be, takes a little longer to get there." His wife reported that he has been asking the same questions more frequently. She also noted that his attention has seemingly changed (i.e., "it was always an issue, but he pays less attention than before"). 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 He denied irritability, paranoia, or hallucinations at that time. He denied depressed mood but did acknowledge "feel[ing] tired all the time." Fatigue has been exacerbated since contracting COVID-19 this past April. He denied any headaches, falls, injuries to the head, double vision, dizziness, focal numbness or tingling, unilateral weakness or tremors, constipation or diarrhea, anosmia, or a history of alcohol or tobacco use. Performance on a brief cognitive screening instrument  (MOCA) was 16/30. Ultimately, [name] was referred for a comprehensive neuropsychological evaluation to characterize his cognitive abilities and to assist with diagnostic clarity and treatment planning.   He completed a comprehensive neuropsychological evaluation with myself on 04/18/2021. Results suggested significant impairment surrounding executive functioning and all aspects of verbal memory. Additional weaknesses were exhibited across processing speed, complex attention (i.e., working memory), receptive language, expressive language outside of semantic fluency, and visuospatial abilities. Performance was appropriate across basic attention, safety/judgment, semantic fluency, and visual memory. At that time, [name]. Eisenhour reported minimal ADL dysfunction and continued to work full-time. As such, given evidence for cognitive dysfunction described above, he was diagnosed with a mild neurocognitive disorder ("mild cognitive impairment"). The etiology was unclear and potentially multifactorial at that time. [name] has a very large degree of illnesses which can impact cerebrovascular functioning (e.g., hypertension, ventricular tachycardia, cardiomyopathy, chronic heart failure, aortic atherosclerosis, hyperlipidemia, prior heart attack, type II diabetes, and untreated obstructive sleep apnea). Cognitively, his pattern of dysfunction surrounding processing speed, attention/concentration, executive functioning, verbal fluency, and encoding/retrieval aspects of memory is consistent with that would typically be expected with a vascular cause. Outside of this, Alzheimer's disease and a "mixed dementia" presentation could not be ruled out given consistent impairment across all verbal memory tasks. However, both visual memory and semantic fluency performances were adequate. There was also question of visual hallucinations, which could raise concerns for Lewy body disease. Repeat testing was recommended.  He was seen by  Ms. Wertman for follow-up on 11/06/2021. She noted that he had been prescribed and was tolerating donepezil/Aricept well. At that time, performance on a brief cognitive screening instrument (MMSE) was 30/30. Ultimately, [name] was referred for a repeat neuropsychological evaluation to characterize his cognitive abilities and to assist with diagnostic clarity and treatment planning.    He was seen in the ED on 01/07/2022 for acute difficulty walking and mild weakness in his right upper extremity, as well as slurred speech. All symptoms were said to have resolved within 15 minutes. Head CT was  negative for an acute processes and head CTA was negative for any large vessel occlusion. He was ultimately diagnosed with a transient ischemic attack (TIA) and discharged home on 01/07/2022. Outpatient monitoring for atrial fibrillation was recommended.   He was seen by [facility] Kai Levins, M.D.) on 02/11/2022 for an evaluation of gait imbalance. 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    Head CT on 02/18/2021 was unremarkable. Head CTs on 01/05/2022 and 01/06/2022 in the context of a stroke vs TIA were both negative for any acute intracranial processes. Imaging did reveal generalized atrophy and microvascular ischemic disease of unspecified severity. He is unable to complete a brain MRI due to his implanted pacemaker.   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   Not compatible with MRI    Barrett's esophagus 09/05/2004   2 cm changes seen at index EGD   Cataract    Chronic back pain 62/69/4854   Chronic systolic heart failure    Colon polyps    tubular adenoma   Diabetic neuropathy 01/14/2022   Difficulty  in walking 10/02/2021   Dizziness 01/19/2011   Dysrhythmia    Essential hypertension 08/26/2009   Fatigue 12/02/2020   Generalized anxiety disorder    since defibrillator placement   GERD (gastroesophageal reflux disease)    Hip pain 12/14/2016   History of COVID-19    History of myocardial infarction    Hyperlipidemia    Left knee pain 12/14/2016   Low back pain 05/14/2021   Malignant neoplasm of prostate 09/27/2014   s/p prostatectomy   Mild anemia 01/20/2011   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 b. s/p Medtronic ICD.   OSA (obstructive sleep apnea) 11/10/2012   regular CPAP use   Pain due to onychomycosis of toenails of both feet 02/23/2022   Pain in the coccyx 01/28/2022   Rectal pain 12/21/2021   Right foot pain 12/15/2017   RLS (restless legs syndrome) 11/29/2012   S/P radiation therapy    10/10/2014 through 11/26/2014; Prostate bed 6600 cGy in 33 sessions   Shingles 07/28/2016   TIA (transient ischemic attack) 01/05/2022   Type II diabetes mellitus 08/26/2009   Ventricular tachycardia    a. s/p ICD (generator change 09/2012). b. 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 and placed on sotalol   Vitamin B12 deficiency 03/06/2011   Monthly B12 shots initiated  2011. Nasally  inhaled B12 as of 07/2012    Past Surgical History:  Procedure Laterality Date   CARDIAC DEFIBRILLATOR PLACEMENT  03/11/2004   Medtronic Maximo single lead   COLONOSCOPY W/ BIOPSIES AND POLYPECTOMY  08/2004, 02/24/11   53m adenoma in 2006,  5 mm polyp (not recovered) 2012   [facility]erman[facility]NAL URETHROTOMY N/A 10/08/2015   Procedure: CYSTOSCOPY WITH DIRECT VISION INTERNAL URETHROTOMY;  Surgeon: BNickie Retort MD;  Location: [facility];  Service: Urology;  Laterality: N/A;   IMPLANTABLE CARDIOVERTER DEFIBRILLATOR GENERATOR CHANGE N/A 10/07/2012   Procedure: IMPLANTABLE CARDIOVERTER DEFIBRILLATOR  GENERATOR CHANGE;  Surgeon: GEvans Lance MD;  [facility];  Service: Cardiovascular;  Laterality: N/A;   PROSTATECTOMY  06/2003   Dr NJanice Norrie  RECTAL SURGERY  UPPER GASTROINTESTINAL ENDOSCOPY  08/2004, 02/24/11   Barrett's esophagus    Current Outpatient Medications:    ACCU-CHEK GUIDE test strip, USE TO CHECK SUGAR 4 TIMES A DAY, Disp: 100 strip, Rfl: 4   acetaminophen (TYLENOL) 500 MG tablet, Take 1,000 mg by mouth every 6 (six) hours as needed for mild pain., Disp: , Rfl:    atorvastatin (LIPITOR) 20 MG tablet, TAKE 1 TABLET DAILY EXCEPT TAKE 1/2 TABLET ON TUESDAY, THURSDAY, AND SATURDAYS, Disp: 72 tablet, Rfl: 10   carvedilol (COREG) 25 MG tablet, TAKE 1 TABLET TWICE DAILY WITH MEALS, Disp: 180 tablet, Rfl: 1   Cholecalciferol (VITAMIN D) 50 MCG (2000 UT) tablet, Take 2,000 Units by mouth daily., Disp: , Rfl:    clopidogrel (PLAVIX) 75 MG tablet, Take 1 tablet (75 mg total) by mouth daily., Disp: 90 tablet, Rfl: 2   COVID-19 mRNA vaccine 2023-2024 (COMIRNATY) syringe, Inject into the muscle., Disp: 0.3 mL, Rfl: 0   digoxin (LANOXIN) 0.125 MG tablet, Take 0.5 tablets (0.0625 mg total) by mouth daily., Disp: 45 tablet, Rfl: 3   docusate sodium (COLACE) 100 MG capsule, Take 1 capsule (100 mg total) by mouth 2 (two) times daily as needed for mild constipation., Disp: 60 capsule, Rfl: 2   donepezil (ARICEPT) 10 MG tablet, TAKE HALF TABLET (5 MG) DAILY FOR 2 WEEKS, THEN INCREASE TO THE FULL TABLET AT 10 MG DAILY (Patient taking differently: Take 10 mg by mouth daily.), Disp: 90 tablet, Rfl: 1   famotidine (PEPCID) 20 MG tablet, Take 1 tablet (20 mg total) by mouth daily., Disp: 90 tablet, Rfl: 3   gabapentin (NEURONTIN) 100 MG capsule, Take 1 capsule (100 mg total) by mouth 2 (two) times daily. (Patient not taking: Reported on 02/24/2022), Disp: 60 capsule, Rfl: 2   influenza vaccine adjuvanted (FLUAD QUADRIVALENT) 0.5 ML injection, Inject into the muscle., Disp: 0.5 mL, Rfl: 0    JANUMET 50-1000 MG tablet, TAKE 1 TABLET EVERY DAY WITH A MEAL, Disp: 90 tablet, Rfl: 10   Lancets (ONETOUCH ULTRASOFT) lancets, Use as instructed to test sugar daily and prn. Dx E11.9, Disp: 100 each, Rfl: 12   losartan (COZAAR) 25 MG tablet, Take 1 tablet (25 mg total) by mouth at bedtime., Disp: 90 tablet, Rfl: 2   pantoprazole (PROTONIX) 40 MG tablet, Take 1 tablet (40 mg total) by mouth daily., Disp: 90 tablet, Rfl: 1   sotalol (BETAPACE) 120 MG tablet, TAKE 1 TABLET TWICE DAILY, Disp: 180 tablet, Rfl: 3   traMADol (ULTRAM) 50 MG tablet, Take 1 tablet (50 mg total) by mouth every 6 (six) hours as needed. (Patient taking differently: Take 50 mg by mouth every 6 (six) hours as needed for moderate pain.), Disp: 30 tablet, Rfl: 0   vitamin B-12 (CYANOCOBALAMIN) 1000 MCG tablet, TAKE 1 TABLET EVERY DAY, Disp: 90 tablet, Rfl: 1  Clinical Interview:   The following information was obtained during a clinical interview with [name] and his wife prior to cognitive testing.  Cognitive Symptoms: Decreased short-term memory: Endorsed. [name] previously described primary difficulties surrounding losing his train of thought and having trouble recalling names and other words he wishes to use on the spot. His wife added concerns surrounding him asking repetitive questions or being repetitive in typical conversation. Acutely, [name] originally stated his perception that his abilities had improved. However, his wife reported progressive decline. After hearing this, he also reported decline, agreeing with his wife. Per her perception, additional decline and generalized confusion was noted after  starting donepezil. She indicated that [name]'s prescribing physician has opted to have him remain on this medication despite her concerns. Overall, progressive decline was said to have been present for the past two years.  Decreased long-term memory: Denied. Decreased attention/concentration: Denied. His wife  previously reported increased concerns surrounding a diminished ability to stay focused and pay attention. This was said to be stable.  Reduced processing speed: Denied. Difficulties with executive functions: Denied. He previously reported increased indecisiveness due to diminished confidence in decision making abilities. This was said to be stable. They denied trouble with impulsivity or any significant personality changes.  Difficulties with emotion regulation: Denied. Difficulties with receptive language: Denied. Difficulties with word finding: Endorsed. He reported commonly experiencing tip-of-the-tongue phenomenons.  Decreased visuoperceptual ability: Denied.   Difficulties completing ADLs: Endorsed. Since his previous evaluation, his wife reported far greater difficulty working his computer or performing various maintenance tasks which he used to perform without any issue. His wife was previously organizing his medications as he reported instances where he may forget to take a medication. This has persisted. Since his previous evaluation, his wife has taken over financial and bill paying responsibilities. While he can drive, his wife reported that he appears very uncertain and often opts not to drive. There are times where he does not appear comfortable driving anywhere that is not a very nearby or familiar location. He no longer works and has since retired relative to his previous evaluation.  Additional Medical History: History of traumatic brain injury/concussion: Denied. History of stroke: Denied. His recent hospitalization where he was diagnosed with a TIA is described above.  History of seizure activity: Denied. History of known exposure to toxins: Denied. Symptoms of chronic pain: Endorsed. He previously reported chronic lower back pain. Medical records also suggest prior reports of hip, knee, and foot pain. Symptoms were said to be stable. Experience of frequent headaches/migraines:  Denied. Frequent instances of dizziness/vertigo: Endorsed "sometimes." These were generally said to be present while standing quickly. This was said to be stable.    Sensory changes: He wears glasses with benefit. Other sensory changes/difficulties (i.e., hearing, taste, or smell) were denied.  Balance/coordination difficulties: Endorsed. He previously reported having "some" balance concerns, noting that he will occasionally feel unsteady on his feet. One side of the body was not said to be worse than the other and he denied any recent falls. He was somewhat vague when describing these difficulties, noting that they had been present for "a good while." This was said to be stable, likely related to diabetic neuropathy and recent EMG results (see above).  Other motor difficulties: Denied.  Sleep History: Estimated hours obtained each night: 6-8 hours.  Difficulties falling asleep: Endorsed occasionally.  Difficulties staying asleep: Denied. Feels rested and refreshed upon awakening: Endorsed for the most part.    History of snoring: Endorsed. His wife noted that these symptoms had seemed to improve over recent years.  History of waking up gasping for air: Endorsed. Witnessed breath cessation while asleep: Endorsed. Where he denied CPAP use during the previous evaluation, he reported re-starting his use of this device and uses it regularly. He subsequently reported an improvement in his overall sleep quality.    History of vivid dreaming: Endorsed. Excessive movement while asleep: Endorsed. His wife previously reported infrequently being hit/kicked while asleep. [name] attributed this to vivid dreams. Medical records also suggest a history of restless leg syndrome. Acutely, his wife reported that these actions seem to have diminished in frequency  over the past year.   Instances of acting out his dreams: Denied.  Psychiatric/Behavioral Health History: Depression: Denied. He described his current  mood as "good" and denied to his knowledge any prior mental health concerns or diagnoses. Current or remote suicidal ideation, intent, or plan was denied.  Anxiety: Denied. However, medical records do suggest a history of generalized anxiety disorder, noting that anxiety symptoms seem elevated since he contracted COVID-19 in April 2022. He also alluded to potential anxiety symptoms when being in larger crowds.  Mania: Denied. Trauma History: Denied. Visual/auditory hallucinations: Endorsed. One previously provided example surrounded him looking at a fire hydrant but seeing a dog. However, when asked during the current appointment, he reported fairly frequent instances where he will see fully-formed animals and people in his environment. Delusional thoughts: Denied.   Tobacco: Denied. Alcohol: He denied current alcohol consumption as well as a history of problematic alcohol abuse or dependence.  Recreational drugs: Denied.  Family History: Problem Relation Age of Onset   Dementia Mother    Hypertension Mother    Coronary artery disease Father    Prostate cancer Father    Diabetes Father    Dementia Sister    Dementia Brother    Diabetes Paternal Grandmother    Dementia Maternal Aunt    Stroke Neg Hx    Colon cancer Neg Hx    Colon polyps Neg Hx    Esophageal cancer Neg Hx    Rectal cancer Neg Hx    Stomach cancer Neg Hx    This information was confirmed by [name]. Yost.  Academic/Vocational History: Highest level of educational attainment: 16 years. He graduated from high school and earned a Dietitian in Industrial/product designer. He described himself as a good (mostly B) student in academic settings. English and writing composition were said to represent likely relative weaknesses in earlier academic settings.  History of developmental delay: Denied. History of grade repetition: Denied. Enrollment in special education courses: Denied. History of LD/ADHD: Denied.   Employment: Retired. 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  Evaluation Results:   Behavioral Observations: [name]. Dimperio was accompanied by his wife, arrived to his appointment on time, and was appropriately dressed and groomed. He appeared alert. Observed gait and station were within normal limits. Gross motor functioning appeared intact upon informal observation and no abnormal movements (e.g., tremors) were noted. His affect was generally relaxed and positive. His wife did remark that he had described some anxiety surrounding upcoming test procedures. Spontaneous speech was fluent and word finding difficulties were not observed during the clinical interview. Thought processes were coherent, organized, and normal in content. Insight into his cognitive difficulties appeared poor. [name]. Gougeon appeared to be a relatively poor historian, was unaware of the results of the previous evaluation, largely deferred to his wife when asked questions surrounding his daily functioning, and likely does not appreciate the extent of ongoing impairment  During testing, he required instruction repetition on several occasions. He also exhibited some trouble comprehending said instructions, especially when more complex. Sustained attention was appropriate. Task engagement was adequate and he persisted when challenged. Overall, [name]. Loeper was cooperative with the clinical interview and subsequent testing procedures.   Adequacy of Effort: The validity of neuropsychological testing is limited by the extent to which the individual being tested may be assumed to have exerted adequate effort during testing. [name]. Carriger expressed his intention to perform to the best of his abilities and exhibited adequate task engagement and persistence. Scores  across stand-alone and embedded performance validity measures were within expectation. As such, the results of the current evaluation are believed to be a valid representation of [name]. Mchaney's current  cognitive functioning.  Test Results: [name]. Rawlinson was fully oriented at the time of the current evaluation.  Intellectual abilities based upon educational and vocational attainment were estimated to be in the average range. Premorbid abilities were estimated to be within the below average range based upon a single-word reading test.   Processing speed was exceptionally low to well below average. Basic attention was average. More complex attention (e.g., working memory) was exceptionally low. Executive functioning was exceptionally low. He did perform in the average range across a task assessing safety and judgment.   Assessed receptive language abilities were exceptionally low. He exhibited difficulties across all aspects of this task, but particularly surrounding sequencing commands, following multi-step commands, and understanding more complex sentence structure. He also did have fairly notable trouble comprehending task instructions across the evaluation, especially across more complex tasks. Assessed expressive language (e.g., verbal fluency and confrontation naming) was below average to average.   Assessed visuospatial/visuoconstructional abilities were variable, ranging from the exceptionally low to average normative ranges. Points were lost on his drawing of a clock due to him superimposing the number 2 on top of the number 1 and then placing a second number 2, as well as incorrect hand placement.    Learning (i.e., encoding) of novel information was average across a shape learning task but exceptionally low to well below average across all three verbal tasks. Spontaneous delayed recall (i.e., retrieval) of previously learned information was exceptionally low to well below average. Retention rates were 0% across a story learning task, 0% across a list learning task, 14% across a daily living task, and 17% across a shape learning task. Performance across recognition tasks was exceptionally low to  below average, suggesting very limited evidence for information consolidation.   Results of emotional screening instruments suggested that recent symptoms of generalized anxiety were in the minimal range, while symptoms of depression were within normal limits. A screening instrument assessing recent sleep quality suggested the presence of minimal sleep dysfunction.  Tables of Scores:   Note: This summary of test scores accompanies the interpretive report and should not be considered in isolation without reference to the appropriate sections in the text. Descriptors are based on appropriate normative data and may be adjusted based on clinical judgment. Terms such as "Within Normal Limits" and "Outside Normal Limits" are used when a more specific description of the test score cannot be determined. Descriptors refer to the current evaluation only.         Percentile - Normative Descriptor > 98 - Exceptionally High 91-97 - Well Above Average 75-90 - Above Average 25-74 - Average 9-24 - Below Average 2-8 - Well Below Average < 2 - Exceptionally Low         December 2022 Current    Orientation:       Raw Score Raw Score Percentile   NAB Orientation, Form 1 28/29 29/29 --- ---        Cognitive Screening:       Raw Score Raw Score Percentile   SLUMS: 15/30 15/30 --- ---        Intellectual Functioning:       Standard Score Standard Score Percentile   Test of Premorbid Functioning: 82 80 9 Below Average        Memory:      NAB Memory  Module, Form 1: Standard Score/ T Score Standard Score/ T Score Percentile   Total Memory Index 62 57 <1 Exceptionally Low  List Learning        Total Trials 1-3 12/36 (28) 12/36 (29) 2 Well Below Average    Short Delay Free Recall 1/12 (19) 0/12 (19) <1 Exceptionally Low    Long Delay Free Recall 0/12 (21) 0/12 (23) <1 Exceptionally Low    Retention Percentage 0 (<19) 0 (22) <1 Exceptionally Low    Recognition Discriminability 2 (34) 3 (39) 14 Below  Average  Shape Learning        Total Trials 1-3 18/27 (56) 13/27 (44) 27 Average    Delayed Recall 6/9 (51) 1/9 (19) <1 Exceptionally Low    Retention Percentage 75 (43) 17 (26) 1 Exceptionally Low    Recognition Discriminability 3 (32) 4 (36) 8 Well Below Average  Story Learning        Immediate Recall 31/80 (26) 35/80 (30) 2 Well Below Average    Delayed Recall 16/40 (30) 0/40 (28) 2 Well Below Average    Retention Percentage 100 (55) 0 (<19) <1 Exceptionally Low  Daily Living Memory        Immediate Recall 16/51 (19) 12/51 (19) <1 Exceptionally Low    Delayed Recall 3/17 (19) 1/17 (19) <1 Exceptionally Low    Retention Percentage 38 (<19) 14 (<19) <1 Exceptionally Low    Recognition Hits 5/10 (20) 3/10 (<19) <1 Exceptionally Low        Attention/Executive Function:      Trail Making Test (TMT): Raw Score (T Score) Raw Score (T Score) Percentile     Part A 98 secs.,  3 errors (22) 107 secs.,  2 errors (24) <1 Exceptionally Low    Part B Discontinued Discontinued --- Impaired          Scaled Score Scaled Score Percentile   WAIS-IV Coding: '4 4 2 '$ Well Below Average        NAB Attention Module, Form 1: T Score T Score Percentile     Digits Forward 44 44 27 Average    Digits Backwards '25 25 1 '$ Exceptionally Low         Scaled Score Scaled Score Percentile   WAIS-IV Similarities: '4 4 2 '$ Well Below Average        D-KEFS Color-Word Interference Test: Raw Score (Scaled Score) Raw Score (Scaled Score) Percentile     Color Naming 45 secs. (5) 50 secs. (3) 1 Exceptionally Low    Word Reading 30 secs. (7) 36 secs. (5) 5 Well Below Average    Inhibition 123 secs. (1) Discontinued --- Impaired      Total Errors 18 errors (1) --- --- ---    Inhibition/Switching 140 secs. (1) Discontinued --- Impaired      Total Errors 18 errors (1) --- --- ---        D-KEFS Verbal Fluency Test: Raw Score (Scaled Score) Raw Score (Scaled Score) Percentile     Letter Total Correct --- 26 (7) 16 Below  Average    Category Total Correct --- 28 (8) 25 Average    Category Switching Total Correct --- 6 (3) 1 Exceptionally Low    Category Switching Accuracy --- 2 (1) <1 Exceptionally Low      Total Set Loss Errors --- 12 (1) <1 Exceptionally Low      Total Repetition Errors --- 12 (1) <1 Exceptionally Low        NAB Executive Functions Module,  Form 1: T Score T Score Percentile     Judgment 52 53 62 Average        Language:      Verbal Fluency Test: Raw Score (T Score) Raw Score (T Score) Percentile     Phonemic Fluency (FAS) 23 (36) 26 (41) 18 Below Average    Animal Fluency 18 (50) 15 (48) 42 Average         NAB Language Module, Form 1: T Score T Score Percentile     Auditory Comprehension 32 19 <1 Exceptionally Low    Naming 28/31 (33) 29/31 (43) 25 Average        Visuospatial/Visuoconstruction:       Raw Score Raw Score Percentile   Clock Drawing: 9/10 6/10 --- Impaired        NAB Spatial Module, Form 1: T Score T Score Percentile     Visual Discrimination --- 19 <1 Exceptionally Low    Figure Drawing Copy 39 50 50 Average         Scaled Score Scaled Score Percentile   WAIS-IV Block Design: '3 3 1 '$ Exceptionally Low        Mood and Personality:       Raw Score Raw Score Percentile   Geriatric Depression Scale: --- 4 --- Within Normal Limits  Geriatric Anxiety Scale: --- 3 --- Minimal    Somatic --- 2 --- Minimal    Cognitive --- 1 --- Minimal    Affective --- 0 --- Minimal        Additional Questionnaires:       Raw Score Raw Score Percentile   PROMIS Sleep Disturbance Questionnaire: 10 17 --- None to Slight   Informed Consent and Coding/Compliance:   The current evaluation represents a clinical evaluation for the purposes previously outlined by the referral source and is in no way reflective of a forensic evaluation.   [name]. Duford was provided with a verbal description of the nature and purpose of the present neuropsychological evaluation. Also reviewed were the  foreseeable risks and/or discomforts and benefits of the procedure, limits of confidentiality, and mandatory reporting requirements of this provider. The patient was given the opportunity to ask questions and receive answers about the evaluation. Oral consent to participate was provided by the patient.   This evaluation was conducted by Christia Reading, Ph.D., ABPP-CN, board certified clinical neuropsychologist. [name]. Taranto completed a clinical interview with Dr. Melvyn Novas, billed as one unit [phone], and 160 minutes of cognitive testing and scoring, billed as one unit [phone] and four additional units [id number]. Psychometrist Cruzita Lederer, B.S., assisted Dr. Melvyn Novas with test administration and scoring procedures. As a separate and discrete service, Dr. Melvyn Novas spent a total of 180 minutes in interpretation and report writing billed as one unit [phone] and two units [id number].

## 2022-05-05 ENCOUNTER — Encounter: Payer: Medicare PPO | Admitting: Psychology

## 2022-05-08 ENCOUNTER — Other Ambulatory Visit (HOSPITAL_BASED_OUTPATIENT_CLINIC_OR_DEPARTMENT_OTHER): Payer: Self-pay

## 2022-05-08 MED ORDER — AREXVY 120 MCG/0.5ML IM SUSR
INTRAMUSCULAR | 0 refills | Status: DC
Start: 2022-05-08 — End: 2022-07-14
  Filled 2022-05-08: qty 1, 1d supply, fill #0

## 2022-05-12 ENCOUNTER — Ambulatory Visit: Payer: Medicare PPO | Admitting: Psychology

## 2022-05-12 ENCOUNTER — Ambulatory Visit: Payer: Medicare PPO | Attending: Internal Medicine | Admitting: Internal Medicine

## 2022-05-12 ENCOUNTER — Encounter: Payer: Self-pay | Admitting: Internal Medicine

## 2022-05-12 VITALS — BP 134/80 | HR 72 | Ht 71.0 in | Wt 190.0 lb

## 2022-05-12 DIAGNOSIS — I6781 Acute cerebrovascular insufficiency: Secondary | ICD-10-CM

## 2022-05-12 DIAGNOSIS — F0280 Dementia in other diseases classified elsewhere without behavioral disturbance: Secondary | ICD-10-CM

## 2022-05-12 DIAGNOSIS — F0150 Vascular dementia without behavioral disturbance: Secondary | ICD-10-CM

## 2022-05-12 DIAGNOSIS — R441 Visual hallucinations: Secondary | ICD-10-CM

## 2022-05-12 DIAGNOSIS — I472 Ventricular tachycardia, unspecified: Secondary | ICD-10-CM

## 2022-05-12 DIAGNOSIS — G309 Alzheimer's disease, unspecified: Secondary | ICD-10-CM

## 2022-05-12 NOTE — Progress Notes (Signed)
   Neuropsychology Feedback Session Tillie Rung. [place] Department of Neurology  Reason for Referral:   [name] is a 72 y.o. right-handed African-American male referred by Sharene Butters, PA-C, to characterize his current cognitive functioning and assist with diagnostic clarity and treatment planning in the context of a prior diagnosis of mild cognitive impairment with concerns for progressive decline.   Feedback:   [name] completed a comprehensive neuropsychological evaluation on 04/27/2022. Please refer to that encounter for the full report and recommendations. Briefly, results suggested fairly diffuse cognitive dysfunction, with ongoing impairment surrounding domains of processing speed, complex attention, executive functioning, receptive language, and essentially all aspects of learning and memory. Visuospatial abilities exhibited some performance variability but were also largely below expectation. Relative to his previous evaluation in December 2022, cognitive decline was exhibited across receptive language, clock drawing, essentially all aspects of visual memory, and delayed retrieval aspects of verbal memory. Outside of this, the majority of cognitive domains exhibited relative stability. However, it is important to highlight that this generally suggests stable impairment rather than intact performances. Regarding concerns for Lewy body disease, testing patterns do suggest impairment surrounding executive functioning and visuospatial abilities. However, it is difficult to determine how much of this is impacted by vascular contributions or another illness such as early stages of Alzheimer's disease. He does exhibit fully formed hallucinations which is certainly a common component of this illness. There have also been concerns surrounding infrequent REM sleep behaviors in the past.   [name] was accompanied by his wife during the current feedback session. Content of  the current session focused on the results of his neuropsychological evaluation. He was able to demonstrate recollection for several tasks which he completed during his testing session, with some fairly accurate perceptions on his performance. This was encouraging from an amnestic memory perspective. [name] was given the opportunity to ask questions and his questions were answered. He was encouraged to reach out should additional questions arise. A copy of his report was provided at the conclusion of the visit.      46 minutes were spent conducting the current feedback session with [name], billed as one unit [phone].

## 2022-05-12 NOTE — Patient Instructions (Signed)
Medication Instructions:  Your physician has recommended you make the following change in your medication:  1) STOP taking Lipitor (atorvastatin) for one month - restart after one month if no changes.  *If you need a refill on your cardiac medications before your next appointment, please call your pharmacy*  Follow-Up: At [facility], you and your health needs are our priority.  As part of our continuing mission to provide you with exceptional heart care, we have created designated Provider Care Teams.  These Care Teams include your primary Cardiologist (physician) and Advanced Practice Providers (APPs -  Physician Assistants and Nurse Practitioners) who all work together to provide you with the care you need, when you need it.  Your next appointment:   1 year(s)  The format for your next appointment:   In Person  Provider:   You may see Cristopher Peru, MD or one of the following Advanced Practice Providers on your designated Care Team:   Tommye Standard, [place] Legrand Como "Jonni Sanger" Chalmers Cater, [place]    Important Information About Sugar

## 2022-05-12 NOTE — Progress Notes (Signed)
HPI [name] returns today for ongoing followup. He is a pleasant 72 yo man with chronic systolic heart failure, s/p ICD insertion, h/o VT, and HTN. He feels well. He has class 2 CHF symptoms. No ICD shocks. He has retired from work.  In the interim he notes no chest pain or sob. His wife thinks that his memory is worse. He sits a lot since retirement.  Allergies  Allergen Reactions   Zantac [Ranitidine Hcl]     itching     Current Outpatient Medications  Medication Sig Dispense Refill   ACCU-CHEK GUIDE test strip USE TO CHECK SUGAR 4 TIMES A DAY 100 strip 4   acetaminophen (TYLENOL) 500 MG tablet Take 1,000 mg by mouth every 6 (six) hours as needed for mild pain.     carvedilol (COREG) 25 MG tablet TAKE 1 TABLET TWICE DAILY WITH MEALS 180 tablet 1   Cholecalciferol (VITAMIN D) 50 MCG (2000 UT) tablet Take 2,000 Units by mouth daily.     clopidogrel (PLAVIX) 75 MG tablet Take 1 tablet (75 mg total) by mouth daily. 90 tablet 2   COVID-19 mRNA vaccine 2023-2024 (COMIRNATY) syringe Inject into the muscle. 0.3 mL 0   digoxin (LANOXIN) 0.125 MG tablet Take 0.5 tablets (0.0625 mg total) by mouth daily. 45 tablet 3   docusate sodium (COLACE) 100 MG capsule Take 1 capsule (100 mg total) by mouth 2 (two) times daily as needed for mild constipation. 60 capsule 2   donepezil (ARICEPT) 10 MG tablet TAKE HALF TABLET (5 MG) DAILY FOR 2 WEEKS, THEN INCREASE TO THE FULL TABLET AT 10 MG DAILY (Patient taking differently: Take 10 mg by mouth daily.) 90 tablet 1   famotidine (PEPCID) 20 MG tablet Take 1 tablet (20 mg total) by mouth daily. 90 tablet 3   influenza vaccine adjuvanted (FLUAD QUADRIVALENT) 0.5 ML injection Inject into the muscle. 0.5 mL 0   JANUMET 50-1000 MG tablet TAKE 1 TABLET EVERY DAY WITH A MEAL 90 tablet 10   Lancets (ONETOUCH ULTRASOFT) lancets Use as instructed to test sugar daily and prn. Dx E11.9 100 each 12   losartan (COZAAR) 25 MG tablet Take 1 tablet (25 mg total) by mouth  at bedtime. 90 tablet 2   pantoprazole (PROTONIX) 40 MG tablet Take 1 tablet (40 mg total) by mouth daily. 90 tablet 1   RSV vaccine recomb adjuvanted (AREXVY) 120 MCG/0.5ML injection Inject into the muscle. 1 each 0   sotalol (BETAPACE) 120 MG tablet TAKE 1 TABLET TWICE DAILY 180 tablet 3   vitamin B-12 (CYANOCOBALAMIN) 1000 MCG tablet TAKE 1 TABLET EVERY DAY 90 tablet 1   No current facility-administered medications for this visit.     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   Not compatible with MRI    Barrett's esophagus 09/05/2004   2 cm changes seen at index EGD   Cataract    Chronic back pain 50/27/7412   Chronic systolic heart failure    Colon polyps    tubular adenoma   Diabetic neuropathy 01/14/2022   Difficulty in walking 10/02/2021   Dizziness 01/19/2011   Dysrhythmia    Essential hypertension 08/26/2009   Fatigue 12/02/2020   Generalized anxiety disorder    since defibrillator placement   GERD (gastroesophageal reflux disease)    Hip pain 12/14/2016   History of COVID-19  History of myocardial infarction    Hyperlipidemia    Left knee pain 12/14/2016   Low back pain 05/14/2021   Malignant neoplasm of prostate 09/27/2014   s/p prostatectomy   Mild anemia 01/20/2011   Mixed dementia 04/27/2022   Neuralgia 10/19/2018   NICM (nonischemic cardiomyopathy)    a.normal cors by cath in 2005. b. s/p Medtronic ICD.   OSA (obstructive sleep apnea) 11/10/2012   regular CPAP use   Pain due to onychomycosis of toenails of both feet 02/23/2022   Pain in the coccyx 01/28/2022   Rectal pain 12/21/2021   Right foot pain 12/15/2017   RLS (restless legs syndrome) 11/29/2012   S/P radiation therapy    10/10/2014 through 11/26/2014; Prostate bed 6600 cGy in 33 sessions   Shingles 07/28/2016   TIA (transient ischemic attack) 01/05/2022   Type II  diabetes mellitus 08/26/2009   Ventricular tachycardia    a. s/p ICD (generator change 09/2012). b. 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 and placed on sotalol   Visual hallucinations    Vitamin B12 deficiency 03/06/2011   Monthly B12 shots initiated  2011. Nasally  inhaled B12 as of 07/2012    ROS:   All systems reviewed and negative except as noted in the HPI.   Past Surgical History:  Procedure Laterality Date   CARDIAC DEFIBRILLATOR PLACEMENT  03/11/2004   Medtronic Maximo single lead   COLONOSCOPY W/ BIOPSIES AND POLYPECTOMY  08/2004, 02/24/11   75m adenoma in 2006,  5 mm polyp (not recovered) 2012   CIraanINTERNAL URETHROTOMY N/A 10/08/2015   Procedure: CYSTOSCOPY WITH DIRECT VISION INTERNAL URETHROTOMY;  Surgeon: BNickie Retort MD;  Location: [facility];  Service: Urology;  Laterality: N/A;   IMPLANTABLE CARDIOVERTER DEFIBRILLATOR GENERATOR CHANGE N/A 10/07/2012   Procedure: IMPLANTABLE CARDIOVERTER DEFIBRILLATOR GENERATOR CHANGE;  Surgeon: GEvans Lance MD;  [facility];  Service: Cardiovascular;  Laterality: N/A;   PROSTATECTOMY  06/2003   Dr NJanice Norrie  RECTAL SURGERY     UPPER GASTROINTESTINAL ENDOSCOPY  08/2004, 02/24/11   Barrett's esophagus     Family History  Problem Relation Age of Onset   Dementia Mother    Hypertension Mother    Coronary artery disease Father    Prostate cancer Father    Diabetes Father    Dementia Sister    Dementia Brother    Diabetes Paternal Grandmother    Dementia Maternal Aunt    Stroke Neg Hx    Colon cancer Neg Hx    Colon polyps Neg Hx    Esophageal cancer Neg Hx    Rectal cancer Neg Hx    Stomach cancer Neg Hx      Social History   Socioeconomic History   Marital status: Married    Spouse name: Not on file   Number of children: 2   Years of education: 16   Highest education level: Bachelor's degree (e.g., BA, AB, BS)  Occupational History   Occupation: Retired    Comment: BElectronics engineer Tobacco  Use   Smoking status: Never   Smokeless tobacco: Never  Vaping Use   Vaping Use: Never used  Substance and Sexual Activity   Alcohol use: No   Drug use: No   Sexual activity: Not on file  Other Topics Concern   Not on file  Social History Narrative   Right handed   No caffeine   Two story home   Social Determinants of Health   Financial  Resource Strain: Low Risk  (11/21/2021)   Overall Financial Resource Strain (CARDIA)    Difficulty of Paying Living Expenses: Not hard at all  Food Insecurity: No Food Insecurity (11/21/2021)   Hunger Vital Sign    Worried About Running Out of Food in the Last Year: Never true    Ran Out of Food in the Last Year: Never true  Transportation Needs: No Transportation Needs (11/21/2021)   PRAPARE - Hydrologist (Medical): No    Lack of Transportation (Non-Medical): No  Physical Activity: Sufficiently Active (11/21/2021)   Exercise Vital Sign    Days of Exercise per Week: 5 days    Minutes of Exercise per Session: 60 min  Stress: No Stress Concern Present (11/21/2021)   [place]    Feeling of Stress : Not at all  Social Connections: [place] (11/21/2021)   Social Connection and Isolation Panel [NHANES]    Frequency of Communication with Friends and Family: More than three times a week    Frequency of Social Gatherings with Friends and Family: More than three times a week    Attends Religious Services: More than 4 times per year    Active Member of [employer] or Organizations: Yes    Attends Music therapist: More than 4 times per year    Marital Status: Married  Human resources officer Violence: Not At Risk (11/21/2021)   Humiliation, Afraid, Rape, and Kick questionnaire    Fear of Current or Ex-Partner: No    Emotionally Abused: No    Physically Abused: No    Sexually Abused: No     BP 134/80   Pulse 72   Ht '5\' 11"'$  (1.803 m)   Wt 190  lb (86.2 kg)   SpO2 97%   BMI 26.50 kg/m   Physical Exam:  Well appearing NAD HEENT: Unremarkable Neck:  No JVD, no thyromegally Lymphatics:  No adenopathy Back:  No CVA tenderness Lungs:  Clear with no wheezes HEART:  Regular rate rhythm, no murmurs, no rubs, no clicks Abd:  soft, positive bowel sounds, no organomegally, no rebound, no guarding Ext:  2 plus pulses, no edema, no cyanosis, no clubbing Skin:  No rashes no nodules Neuro:  CN II through XII intact, motor grossly intact  DEVICE  Normal device function.  See PaceArt for details.   Assess/Plan: 1. Chronic systolic heart failure - his symptoms are class 2. He will continue his current meds. He has been sedentary and I asked him to walk everyday. 2. ICD - his medtronic single chamber ICD is working normally. 3. VT - he has had no sustained episodes. He will undergo watchful waiting. He will continue his sotalol. 4. Obesity - he has finally been able to lose weight. I encouraged him not to gain any back. He is down 5 lbs since his last visit. 5. Memory problems - he is on aricept. I asked him to hold the atorvastatin for a month. If his memory improves, stay off the statin.   Carleene Overlie Jammal Sarr,MD

## 2022-05-14 ENCOUNTER — Encounter: Payer: Self-pay | Admitting: Physician Assistant

## 2022-05-14 ENCOUNTER — Ambulatory Visit: Payer: Medicare PPO | Admitting: Physician Assistant

## 2022-05-14 VITALS — BP 160/83 | HR 66 | Resp 18 | Ht 71.0 in | Wt 189.0 lb

## 2022-05-14 DIAGNOSIS — R441 Visual hallucinations: Secondary | ICD-10-CM

## 2022-05-14 DIAGNOSIS — G309 Alzheimer's disease, unspecified: Secondary | ICD-10-CM | POA: Diagnosis not present

## 2022-05-14 DIAGNOSIS — F0280 Dementia in other diseases classified elsewhere without behavioral disturbance: Secondary | ICD-10-CM | POA: Diagnosis not present

## 2022-05-14 DIAGNOSIS — F0150 Vascular dementia without behavioral disturbance: Secondary | ICD-10-CM

## 2022-05-14 MED ORDER — MEMANTINE HCL 5 MG PO TABS
ORAL_TABLET | ORAL | 11 refills | Status: DC
Start: 2022-05-14 — End: 2022-11-13

## 2022-05-14 NOTE — Patient Instructions (Addendum)
It was a pleasure to see you today at our office.   Recommendations:  Follow up in  6  months after the neurocognitive testing Continue donepezil 10 mg daily  Start memantine 5 mg at night, side effects dicussed  Follow with Dr. Berdine Addison for Neuropathy on 08/2022   Whom to call:  Memory  decline, memory medications: Call our office [phone]   For psychiatric meds, mood meds: Please have your primary care physician manage these medications.       For assessment of decision of mental capacity and competency:  Call Dr. Anthoney Harada, geriatric psychiatrist at [phone]  For guidance in geriatric dementia issues please call [facility] [phone]     If you have any severe symptoms of a stroke, or other severe issues such as confusion,severe chills or fever, etc call 911 or go to the ER as you may need to be evaluated further        RECOMMENDATIONS FOR ALL PATIENTS WITH MEMORY PROBLEMS: 1. Continue to exercise (Recommend 30 minutes of walking everyday, or 3 hours every week) 2. Increase social interactions - continue going to [place] and enjoy social gatherings with friends and family 3. Eat healthy, avoid fried foods and eat more fruits and vegetables 4. Maintain adequate blood pressure, blood sugar, and blood cholesterol level. Reducing the risk of stroke and cardiovascular disease also helps promoting better memory. 5. Avoid stressful situations. Live a simple life and avoid aggravations. Organize your time and prepare for the next day in anticipation. 6. Sleep well, avoid any interruptions of sleep and avoid any distractions in the bedroom that may interfere with adequate sleep quality 7. Avoid sugar, avoid sweets as there is a strong link between excessive sugar intake, diabetes, and cognitive impairment We discussed the Mediterranean diet, which has been shown to help patients reduce the risk of progressive memory disorders and reduces cardiovascular risk. This  includes eating fish, eat fruits and green leafy vegetables, nuts like almonds and hazelnuts, walnuts, and also use olive oil. Avoid fast foods and fried foods as much as possible. Avoid sweets and sugar as sugar use has been linked to worsening of memory function.  There is always a concern of gradual progression of memory problems. If this is the case, then we may need to adjust level of care according to patient needs. Support, both to the patient and caregiver, should then be put into place.    FALL PRECAUTIONS: Be cautious when walking. Scan the area for obstacles that may increase the risk of trips and falls. When getting up in the mornings, sit up at the edge of the bed for a few minutes before getting out of bed. Consider elevating the bed at the head end to avoid drop of blood pressure when getting up. Walk always in a well-lit room (use night lights in the walls). Avoid area rugs or power cords from appliances in the middle of the walkways. Use a walker or a cane if necessary and consider physical therapy for balance exercise. Get your eyesight checked regularly.  FINANCIAL OVERSIGHT: Supervision, especially oversight when making financial decisions or transactions is also recommended.  HOME SAFETY: Consider the safety of the kitchen when operating appliances like stoves, microwave oven, and blender. Consider having supervision and share cooking responsibilities until no longer able to participate in those. Accidents with firearms and other hazards in the house should be identified and addressed as well.   ABILITY TO BE LEFT ALONE: If patient is unable  to contact 911 operator, consider using LifeLine, or when the need is there, arrange for someone to stay with patients. Smoking is a fire hazard, consider supervision or cessation. Risk of wandering should be assessed by caregiver and if detected at any point, supervision and safe proof recommendations should be instituted.  MEDICATION  SUPERVISION: Inability to self-administer medication needs to be constantly addressed. Implement a mechanism to ensure safe administration of the medications.   DRIVING: Regarding driving, in patients with progressive memory problems, driving will be impaired. We advise to have someone else do the driving if trouble finding directions or if minor accidents are reported. Independent driving assessment is available to determine safety of driving.   If you are interested in the driving assessment, you can contact the following:  [employer] in [place]  [place] [phone]  [place]  [facility] [phone] or [phone]

## 2022-05-14 NOTE — Progress Notes (Signed)
Assessment/Plan:    Mixed Dementia with visual hallucinations  [name] is a very pleasant 72 y.o. RH male with a history of hypertension, ventricular tachycardia s/p MI , cardiomyopathy, chronic heart failure, aortic atherosclerosis, hyperlipidemia, Type II diabetes and obstructive sleep apnea, B12 deficiency seen today in follow up for memory loss.  Head CT 01/05/2022 taken in the context of a stroke versus TIA were both negative for any acute intracranial processes, imaging did reveal generalized atrophy and microvascular ischemic disease.  He is unable to complete the brain MRI and DaT due to implanted pacemaker not MRI compatible.  He had a neuropsychological evaluation in December 2023 yielding a diagnosis of Mixed Dementia, at this time difficult to have a clear etiology, although the presence of a neurodegenerative illness seems quite likely.  Patient is currently on donepezil 10 mg daily, tolerating well.  Given some progression of the memory difficulties, will begin adding Memantine to the regimen   Follow up in  6 months. Continue donepezil 10 mg daily. Side effects were discussed Start Memantine '5mg'$  tablets.  Take 1 tablet at bedtime (patient and wife prefer to avoid the dizziness associated as a side effect) . If tolerated, will entertain increasing to 10 mg at night. Patient and wife agreed with the plan.    Continue CPAP use Patient is scheduled for repeat Neuropsych evaluation for clarity of the diagnosis and disease progression on 05/14/2023  Mild chronic radiculopathy The patient has been evaluated by Dr. 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 HE denies any worsening of symptoms  unable to tolerate  gabapentin due to dizziness, he is to discuss other therapeutic options during his appt with Dr. Berdine Addison.    He is to follow-up with Dr. Berdine Addison on 08/14/2022    Subjective:    This patient is accompanied in the office by his wife who supplements the history.  Previous records as well as any outside records available were reviewed prior to todays visit. Patient was last seen on 11/06/21, with MMSE of 30/30    Any changes in memory since last visit?  ""About the same Patient has some difficulty remembering recent conversations and people names repeats oneself?  "sometimes", "especially telling" Disoriented when walking into a room?  Patient denies  Leaving objects in unusual places?    denies   Wandering behavior?  denies   Any personality changes since last visit?  denies   Any depression?:  denies   Hallucinations or paranoia?  "They are people inside the room that I have never seen them before. When walking by them they disappear ". Not scary to him.  "During the day I do not see them ". No auditory hallucinations. No paranoia Seizures?    denies    Any sleep changes?  "I get up too early around 4 am, then I turn the TV ".  Sometimes he has vivid dreams, "One time I was playing basketball and was reenacting it ".  No sleepwalking   Sleep apnea? Uses CPAP " it is a life saver" Any hygiene concerns?    denies   Independent of bathing and dressing?  Endorsed  Does the patient needs help with medications?  Patient and his wife prepare the pillbox together.   Who is in charge of the  finances?  Wife is in charge    Any changes in appetite?  denies    Patient have trouble swallowing?  denies   Does the patient cook? Endorsed   Any kitchen accidents such as leaving the stove on? Denies.  "Mostly I microwave " Any headaches? Occasionally. " I had one 1 day, on top of the head, it went away by itself after 1 h after falling asleep" Chronic back pain  denies   Ambulates with difficulty?  "After physical  therapy it is A whole lot different. I walk a lap around the complex without any problems " Recent falls or head injuries?  Denies Unilateral weakness, numbness or tingling? In August he may have experienced a TIAs, he is now on Plavix daily Any tremors?  denies   Any anosmia?  Patient denies   Any incontinence of urine? Endorsed, wears pads since his prostate surgery for prostate Ca  Any bowel dysfunction?  denies      Patient lives with his wife Does the patient drive?Endorsed. Denies getting lost "not yet"  Neuropsychological evaluation 04/27/22, Dr. Melvyn Novas :  Briefly, results suggested fairly diffuse cognitive dysfunction, with ongoing impairment surrounding domains of processing speed, complex attention, executive functioning, receptive language, and essentially all aspects of learning and memory. Visuospatial abilities exhibited some performance variability but were also largely below expectation. Relative to his previous evaluation in December 2022, cognitive decline was exhibited across receptive language, clock drawing, essentially all aspects of visual memory, and delayed retrieval aspects of verbal memory. Outside of this, the majority of cognitive domains exhibited relative stability. However, it is important to highlight that this generally suggests stable impairment rather than intact performances. Regarding concerns for Lewy body disease, testing patterns do suggest impairment surrounding executive functioning and visuospatial abilities. However, it is difficult to determine how much of this is impacted by vascular contributions or another illness such as early stages of Alzheimer's disease. He does exhibit fully formed hallucinations which is certainly a common component of this illness. There have also been concerns surrounding infrequent REM sleep behaviors in the past.    PREVIOUS MEDICATIONS:   CURRENT MEDICATIONS:  Outpatient Encounter Medications as of 05/14/2022  Medication Sig    ACCU-CHEK GUIDE test strip USE TO CHECK SUGAR 4 TIMES A DAY   acetaminophen (TYLENOL) 500 MG tablet Take 1,000 mg by mouth every 6 (six) hours as needed for mild pain.   carvedilol (COREG) 25 MG tablet TAKE 1 TABLET TWICE DAILY WITH MEALS   Cholecalciferol (VITAMIN D) 50 MCG (2000 UT) tablet Take 2,000 Units by mouth daily.   clopidogrel (PLAVIX) 75 MG tablet Take 1 tablet (75 mg total) by mouth daily.   COVID-19 mRNA vaccine 2023-2024 (COMIRNATY) syringe Inject into the muscle.   digoxin (LANOXIN) 0.125 MG tablet Take 0.5 tablets (0.0625 mg total) by mouth daily.   docusate sodium (COLACE) 100 MG capsule Take 1 capsule (100 mg total) by mouth 2 (two) times daily as needed for mild constipation.   donepezil (ARICEPT) 10 MG tablet TAKE HALF TABLET (5 MG) DAILY FOR 2 WEEKS, THEN INCREASE TO THE FULL TABLET AT 10 MG DAILY (Patient taking differently: Take 10 mg by mouth daily.)   famotidine (PEPCID) 20 MG tablet Take 1 tablet (20 mg total) by mouth daily.   influenza vaccine adjuvanted (FLUAD QUADRIVALENT) 0.5 ML injection Inject into the muscle.   JANUMET 50-1000 MG tablet TAKE 1 TABLET EVERY DAY WITH A MEAL   Lancets (ONETOUCH ULTRASOFT) lancets Use as  instructed to test sugar daily and prn. Dx E11.9   losartan (COZAAR) 25 MG tablet Take 1 tablet (25 mg total) by mouth at bedtime.   memantine (NAMENDA) 5 MG tablet Take 1 tablet (5 mg at night)   pantoprazole (PROTONIX) 40 MG tablet Take 1 tablet (40 mg total) by mouth daily.   RSV vaccine recomb adjuvanted (AREXVY) 120 MCG/0.5ML injection Inject into the muscle.   sotalol (BETAPACE) 120 MG tablet TAKE 1 TABLET TWICE DAILY   vitamin B-12 (CYANOCOBALAMIN) 1000 MCG tablet TAKE 1 TABLET EVERY DAY   No facility-administered encounter medications on file as of 05/14/2022.       11/10/2021    6:00 PM  MMSE - Mini Mental State Exam  Orientation to time 5  Orientation to Place 5  Registration 3  Attention/ Calculation 5  Recall 3  Language- name 2  objects 2  Language- repeat 1  Language- follow 3 step command 3  Language- read & follow direction 1  Write a sentence 1  Copy design 1  Total score 30      12/05/2020   10:00 AM  Montreal Cognitive Assessment   Visuospatial/ Executive (0/5) 3  Naming (0/3) 2  Attention: Read list of digits (0/2) 2  Attention: Read list of letters (0/1) 1  Attention: Serial 7 subtraction starting at 100 (0/3) 1  Language: Repeat phrase (0/2) 0  Language : Fluency (0/1) 0  Abstraction (0/2) 0  Delayed Recall (0/5) 1  Orientation (0/6) 6  Total 16    Objective:     PHYSICAL EXAMINATION:    VITALS:   Vitals:   05/14/22 0847  BP: (!) 160/83  Pulse: 66  Resp: 18  SpO2: 99%  Weight: 189 lb (85.7 kg)  Height: '5\' 11"'$  (1.803 m)    GEN:  The patient appears stated age and is in NAD. HEENT:  Normocephalic, atraumatic.   Neurological examination:  General: NAD, well-groomed, appears stated age. Orientation: The patient is alert. Oriented to person, place and date Cranial nerves: There is good facial symmetry.no hypomimia.  The speech is fluent and clear. No aphasia or dysarthria. Fund of knowledge is appropriate. Recent and remote memory are impaired. Attention and concentration are not reduced.  Able to name objects and repeat phrases.  Hearing is intact to conversational tone.    Sensation: Sensation is intact to light touch on the upper extremities, mildly decreased on lower extremities.  Able to feel temperature, pain, vibration Motor: Strength is at least antigravity x4. DTR's 2/4 in UE/LE     Movement examination: Tone: There is normal tone in the UE/LE Abnormal movements:  no tremor.  No myoclonus.  No asterixis.   Coordination:  There is no decremation with RAM's. Normal finger to nose  Gait and Station: The patient has no difficulty arising out of a deep-seated chair without the use of the hands. The patient's stride length is good.  Can reproduce some back pain around L4-L5.   Gait is cautious, unstable and narrow.  No shuffling is noted   Thank you for allowing [place] the opportunity to participate in the care of this nice patient. Please do not hesitate to contact us for any questions or concerns.   Total time spent on today's visit was 47 minutes dedicated to this patient today, preparing to see patient, examining the patient, ordering tests and/or medications and counseling the patient, documenting clinical information in the EHR or other health record, independently interpreting results and communicating results to  the patient/family, discussing treatment and goals, answering patient's questions and coordinating care.  Cc:  Binnie Rail, MD  Sharene Butters 05/14/2022 10:00 AM

## 2022-05-15 ENCOUNTER — Encounter: Payer: Self-pay | Admitting: Internal Medicine

## 2022-05-18 DIAGNOSIS — U071 COVID-19: Secondary | ICD-10-CM | POA: Diagnosis not present

## 2022-05-19 DIAGNOSIS — G4733 Obstructive sleep apnea (adult) (pediatric): Secondary | ICD-10-CM | POA: Diagnosis not present

## 2022-05-22 ENCOUNTER — Encounter: Payer: Self-pay | Admitting: Physician Assistant

## 2022-05-22 ENCOUNTER — Encounter: Payer: Self-pay | Admitting: Internal Medicine

## 2022-05-22 NOTE — Telephone Encounter (Signed)
Hold the medication for 1 week  and see if the diarrhea subsides. Then it could have been the med. If it is not, or worse then contact primary doctor, thanks

## 2022-05-24 DIAGNOSIS — G4733 Obstructive sleep apnea (adult) (pediatric): Secondary | ICD-10-CM | POA: Diagnosis not present

## 2022-05-27 ENCOUNTER — Ambulatory Visit: Payer: Medicare PPO | Admitting: Podiatry

## 2022-05-27 ENCOUNTER — Encounter: Payer: Self-pay | Admitting: Physician Assistant

## 2022-06-10 ENCOUNTER — Ambulatory Visit: Payer: Medicare PPO | Admitting: Podiatry

## 2022-06-10 DIAGNOSIS — M79674 Pain in right toe(s): Secondary | ICD-10-CM

## 2022-06-10 DIAGNOSIS — E0842 Diabetes mellitus due to underlying condition with diabetic polyneuropathy: Secondary | ICD-10-CM | POA: Diagnosis not present

## 2022-06-10 DIAGNOSIS — M79675 Pain in left toe(s): Secondary | ICD-10-CM | POA: Diagnosis not present

## 2022-06-10 DIAGNOSIS — B351 Tinea unguium: Secondary | ICD-10-CM

## 2022-06-10 NOTE — Progress Notes (Signed)
This patient presents to the office with chief complaint of long thick nails and diabetic feet.  This patient  says there  is  no pain and discomfort in their feet.  This patient says there are long thick painful nails.  These nails are painful walking and wearing shoes.  Patient has no history of infection or drainage from both feet.  Patient is unable to  self treat his own nails .He presents to the office with his wife. This patient presents  to the office today for treatment of the  long nails and a foot evaluation due to history of  diabetes.  General Appearance  Alert, conversant and in no acute stress.  Vascular  Dorsalis pedis and posterior tibial  pulses are palpable  bilaterally.  Capillary return is within normal limits  bilaterally. Temperature is within normal limits  bilaterally.  Neurologic  Senn-Weinstein monofilament wire test within normal limits right.  LOPS diminished left foot. Muscle power within normal limits bilaterally.  Nails Thick disfigured discolored nails with subungual debris  from hallux to fifth toes bilaterally. No evidence of bacterial infection or drainage bilaterally.  Orthopedic  No limitations of motion of motion feet .  No crepitus or effusions noted.  HAV  B/L.  Hammer toes  2-5  B/L. Plantar flexed fifth metatarsals  B/L.  Skin  normotropic skin with no porokeratosis noted bilaterally.  No signs of infections or ulcers noted.   Asymptomatic callus sub 5th  B/L.  Onychomycosis  Diabetes with no foot complications  IE  Debride nails x 10 with nail nipper and dremel tool.  A diabetic foot exam was performed and there is no evidence of any vascular or neurologic pathology.   RTC 3 months.  Boneta Lucks D.P.M.

## 2022-06-10 NOTE — Progress Notes (Signed)
Follow up with Dr. Prudence Davidson

## 2022-06-18 ENCOUNTER — Ambulatory Visit (INDEPENDENT_AMBULATORY_CARE_PROVIDER_SITE_OTHER): Payer: Medicare PPO

## 2022-06-18 DIAGNOSIS — I428 Other cardiomyopathies: Secondary | ICD-10-CM | POA: Diagnosis not present

## 2022-06-18 LAB — CUP PACEART REMOTE DEVICE CHECK
Battery Remaining Longevity: 31 mo
Battery Voltage: 2.95 V
Brady Statistic RV Percent Paced: 0.02 %
Date Time Interrogation Session: 20240208033425
HighPow Impedance: 56 Ohm
Implantable Lead Connection Status: 753985
Implantable Lead Implant Date: 20140530
Implantable Lead Location: 753860
Implantable Lead Model: 6935
Implantable Pulse Generator Implant Date: 20140530
Lead Channel Impedance Value: 342 Ohm
Lead Channel Impedance Value: 418 Ohm
Lead Channel Pacing Threshold Amplitude: 0.625 V
Lead Channel Pacing Threshold Pulse Width: 0.4 ms
Lead Channel Sensing Intrinsic Amplitude: 7 mV
Lead Channel Sensing Intrinsic Amplitude: 7 mV
Lead Channel Setting Pacing Amplitude: 2.5 V
Lead Channel Setting Pacing Pulse Width: 0.4 ms
Lead Channel Setting Sensing Sensitivity: 0.3 mV

## 2022-06-19 DIAGNOSIS — G4733 Obstructive sleep apnea (adult) (pediatric): Secondary | ICD-10-CM | POA: Diagnosis not present

## 2022-06-20 ENCOUNTER — Encounter: Payer: Self-pay | Admitting: Internal Medicine

## 2022-06-22 MED ORDER — FAMOTIDINE 20 MG PO TABS: 20.0000 mg | ORAL_TABLET | Freq: Every day | ORAL | 3 refills | Status: DC

## 2022-07-09 NOTE — Progress Notes (Signed)
Remote ICD transmission.   

## 2022-07-13 ENCOUNTER — Encounter: Payer: Self-pay | Admitting: Internal Medicine

## 2022-07-13 NOTE — Progress Notes (Unsigned)
Subjective:    Patient ID: [name], male    DOB: 06/17/1950, 72 y.o.   MRN: [id number]     HPI Muath is here for follow up of his chronic medical problems, including h/o TIA, htn, hld, DM w neuropathy, HFrEF, mild vascular neurocognitive d/o, B12 def  Has diarrhea at least once a week.  Constant gurgling.  ? Related to namenda - the only new medication.  Balance is still very poor.  He did see orthopedics and neurology.  He did physical therapy and he thinks it may have helped but is not sure.  He is not exercising at this time.  Has chronic mild L5 rad on left, diabetic neuro  Lab Results  Component Value Date   HGBA1C 6.2 (H) 01/06/2022     Medications and allergies reviewed with patient and updated if appropriate.  Current Outpatient Medications on File Prior to Visit  Medication Sig Dispense Refill   ACCU-CHEK GUIDE test strip USE TO CHECK SUGAR 4 TIMES A DAY 100 strip 4   acetaminophen (TYLENOL) 500 MG tablet Take 1,000 mg by mouth every 6 (six) hours as needed for mild pain.     atorvastatin (LIPITOR) 20 MG tablet Take by mouth.     carvedilol (COREG) 25 MG tablet TAKE 1 TABLET TWICE DAILY WITH MEALS 180 tablet 1   Cholecalciferol (VITAMIN D) 50 MCG (2000 UT) tablet Take 2,000 Units by mouth daily.     clopidogrel (PLAVIX) 75 MG tablet Take 1 tablet (75 mg total) by mouth daily. 90 tablet 2   digoxin (LANOXIN) 0.125 MG tablet Take 0.5 tablets (0.0625 mg total) by mouth daily. 45 tablet 3   docusate sodium (COLACE) 100 MG capsule Take 1 capsule (100 mg total) by mouth 2 (two) times daily as needed for mild constipation. 60 capsule 2   donepezil (ARICEPT) 10 MG tablet TAKE HALF TABLET (5 MG) DAILY FOR 2 WEEKS, THEN INCREASE TO THE FULL TABLET AT 10 MG DAILY (Patient taking differently: Take 10 mg by mouth daily.) 90 tablet 1   famotidine (PEPCID) 20 MG tablet Take 1 tablet (20 mg total) by mouth daily. 90 tablet 3   JANUMET 50-1000 MG tablet TAKE 1 TABLET EVERY  DAY WITH A MEAL 90 tablet 10   Lancets (ONETOUCH ULTRASOFT) lancets Use as instructed to test sugar daily and prn. Dx E11.9 100 each 12   losartan (COZAAR) 25 MG tablet Take 1 tablet (25 mg total) by mouth at bedtime. 90 tablet 2   memantine (NAMENDA) 5 MG tablet Take 1 tablet (5 mg at night) 30 tablet 11   pantoprazole (PROTONIX) 40 MG tablet Take 1 tablet (40 mg total) by mouth daily. 90 tablet 1   sotalol (BETAPACE) 120 MG tablet TAKE 1 TABLET TWICE DAILY 180 tablet 3   vitamin B-12 (CYANOCOBALAMIN) 1000 MCG tablet TAKE 1 TABLET EVERY DAY 90 tablet 1   No current facility-administered medications on file prior to visit.     Review of Systems  Constitutional:  Negative for fever.  Respiratory:  Negative for cough, shortness of breath and wheezing.   Cardiovascular:  Positive for palpitations (occ). Negative for chest pain and leg swelling.  Gastrointestinal:  Positive for diarrhea. Negative for abdominal pain (cramping with diarrhea), blood in stool and nausea.       No gerd  Musculoskeletal:  Positive for back pain (chronic).  Neurological:  Positive for light-headedness (occ, mild). Negative for numbness and headaches.  Objective:   Vitals:   07/14/22 1015 07/14/22 1114  BP: (!) 140/68 134/72  Pulse: 65   Temp: 98.6 F (37 C)   SpO2: 98%    BP Readings from Last 3 Encounters:  07/14/22 134/72  05/14/22 (!) 160/83  05/12/22 134/80   Wt Readings from Last 3 Encounters:  07/14/22 190 lb (86.2 kg)  05/14/22 189 lb (85.7 kg)  05/12/22 190 lb (86.2 kg)   Body mass index is 26.5 kg/m.    Physical Exam Constitutional:      General: He is not in acute distress.    Appearance: Normal appearance. He is not ill-appearing.  HENT:     Head: Normocephalic and atraumatic.  Eyes:     Conjunctiva/sclera: Conjunctivae normal.  Cardiovascular:     Rate and Rhythm: Normal rate and regular rhythm.     Heart sounds: Normal heart sounds.  Pulmonary:     Effort: Pulmonary  effort is normal. No respiratory distress.     Breath sounds: Normal breath sounds. No wheezing or rales.  Musculoskeletal:     Right lower leg: No edema.     Left lower leg: No edema.  Skin:    General: Skin is warm and dry.     Findings: No rash.  Neurological:     Mental Status: He is alert. Mental status is at baseline.  Psychiatric:        Mood and Affect: Mood normal.        Lab Results  Component Value Date   WBC 5.8 01/06/2022   HGB 12.3 (L) 01/06/2022   HCT 38.2 (L) 01/06/2022   PLT 218 01/06/2022   GLUCOSE 93 01/06/2022   CHOL 109 01/06/2022   TRIG 42 01/06/2022   HDL 37 (L) 01/06/2022   LDLCALC 64 01/06/2022   ALT 20 01/06/2022   AST 16 01/06/2022   NA 140 01/06/2022   K 3.5 01/06/2022   CL 105 01/06/2022   CREATININE 0.87 01/06/2022   BUN 8 01/06/2022   CO2 28 01/06/2022   TSH 3.31 08/04/2021   PSA 0.00 Repeated and verified X2. (L) 11/14/2020   INR 1.1 01/05/2022   HGBA1C 6.2 (H) 01/06/2022   MICROALBUR 1.7 08/04/2021     Assessment & Plan:    See Problem List for Assessment and Plan of chronic medical problems.

## 2022-07-13 NOTE — Patient Instructions (Addendum)
      Blood work was ordered.   The lab is on the first floor.    Medications changes include :   stop namenda - see if the diarrhea stops.     A referral was ordered for home PT.     Someone will call you to schedule an appointment.    Return in about 6 months (around 01/14/2023) for Physical Exam.

## 2022-07-14 ENCOUNTER — Ambulatory Visit: Payer: Medicare PPO | Admitting: Internal Medicine

## 2022-07-14 VITALS — BP 134/72 | HR 65 | Temp 98.6°F | Ht 71.0 in | Wt 190.0 lb

## 2022-07-14 DIAGNOSIS — F0150 Vascular dementia without behavioral disturbance: Secondary | ICD-10-CM

## 2022-07-14 DIAGNOSIS — E538 Deficiency of other specified B group vitamins: Secondary | ICD-10-CM

## 2022-07-14 DIAGNOSIS — I5022 Chronic systolic (congestive) heart failure: Secondary | ICD-10-CM | POA: Diagnosis not present

## 2022-07-14 DIAGNOSIS — F0280 Dementia in other diseases classified elsewhere without behavioral disturbance: Secondary | ICD-10-CM

## 2022-07-14 DIAGNOSIS — R2689 Other abnormalities of gait and mobility: Secondary | ICD-10-CM | POA: Diagnosis not present

## 2022-07-14 DIAGNOSIS — G309 Alzheimer's disease, unspecified: Secondary | ICD-10-CM | POA: Diagnosis not present

## 2022-07-14 DIAGNOSIS — M5417 Radiculopathy, lumbosacral region: Secondary | ICD-10-CM | POA: Diagnosis not present

## 2022-07-14 DIAGNOSIS — E1151 Type 2 diabetes mellitus with diabetic peripheral angiopathy without gangrene: Secondary | ICD-10-CM

## 2022-07-14 DIAGNOSIS — E7849 Other hyperlipidemia: Secondary | ICD-10-CM | POA: Diagnosis not present

## 2022-07-14 DIAGNOSIS — R197 Diarrhea, unspecified: Secondary | ICD-10-CM

## 2022-07-14 DIAGNOSIS — Z8673 Personal history of transient ischemic attack (TIA), and cerebral infarction without residual deficits: Secondary | ICD-10-CM | POA: Diagnosis not present

## 2022-07-14 DIAGNOSIS — E0842 Diabetes mellitus due to underlying condition with diabetic polyneuropathy: Secondary | ICD-10-CM

## 2022-07-14 DIAGNOSIS — I10 Essential (primary) hypertension: Secondary | ICD-10-CM | POA: Diagnosis not present

## 2022-07-14 LAB — COMPREHENSIVE METABOLIC PANEL
ALT: 9 U/L (ref 0–53)
AST: 12 U/L (ref 0–37)
Albumin: 3.9 g/dL (ref 3.5–5.2)
Alkaline Phosphatase: 100 U/L (ref 39–117)
BUN: 9 mg/dL (ref 6–23)
CO2: 30 mEq/L (ref 19–32)
Calcium: 9.6 mg/dL (ref 8.4–10.5)
Chloride: 107 mEq/L (ref 96–112)
Creatinine, Ser: 0.97 mg/dL (ref 0.40–1.50)
GFR: 78.74 mL/min (ref >60.00)
Glucose, Bld: 83 mg/dL (ref 70–99)
Potassium: 3.6 mEq/L (ref 3.5–5.1)
Sodium: 144 mEq/L (ref 135–145)
Total Bilirubin: 0.6 mg/dL (ref 0.2–1.2)
Total Protein: 7.3 g/dL (ref 6.0–8.3)

## 2022-07-14 LAB — CBC WITH DIFFERENTIAL/PLATELET
Basophils Absolute: 0 10*3/uL (ref 0.0–0.1)
Basophils Relative: 0.2 % (ref 0.0–3.0)
Eosinophils Absolute: 0.1 10*3/uL (ref 0.0–0.7)
Eosinophils Relative: 1.3 % (ref 0.0–5.0)
HCT: 37.6 % — ABNORMAL LOW (ref 39.0–52.0)
Hemoglobin: 12.1 g/dL — ABNORMAL LOW (ref 13.0–17.0)
Lymphocytes Relative: 30 % (ref 12.0–46.0)
Lymphs Abs: 1.5 10*3/uL (ref 0.7–4.0)
MCHC: 32.2 g/dL (ref 30.0–36.0)
MCV: 72.1 fl — ABNORMAL LOW (ref 78.0–100.0)
Monocytes Absolute: 0.4 10*3/uL (ref 0.1–1.0)
Monocytes Relative: 8.4 % (ref 3.0–12.0)
Neutro Abs: 3 10*3/uL (ref 1.4–7.7)
Neutrophils Relative %: 60.1 % (ref 43.0–77.0)
Platelets: 201 10*3/uL (ref 150.0–400.0)
RBC: 5.22 Mil/uL (ref 4.22–5.81)
RDW: 16.2 % — ABNORMAL HIGH (ref 11.5–15.5)
WBC: 5 10*3/uL (ref 4.0–10.5)

## 2022-07-14 LAB — HEMOGLOBIN A1C: Hgb A1c MFr Bld: 5.9 % (ref 4.6–6.5)

## 2022-07-14 LAB — LIPID PANEL
Cholesterol: 93 mg/dL (ref 0–200)
HDL: 39.8 mg/dL (ref >39.00)
LDL Cholesterol: 42 mg/dL (ref 0–99)
NonHDL: 52.91
Total CHOL/HDL Ratio: 2
Triglycerides: 53 mg/dL (ref 0.0–149.0)
VLDL: 10.6 mg/dL (ref 0.0–40.0)

## 2022-07-14 LAB — VITAMIN B12: Vitamin B-12: >1500 pg/mL — ABNORMAL HIGH (ref 211–911)

## 2022-07-14 NOTE — Assessment & Plan Note (Signed)
Chronic Taking B12 daily Check B12 level

## 2022-07-14 NOTE — Assessment & Plan Note (Signed)
Chronic Following with neurology Continue donepezil 10 mg daily Namenda was started that may be causing some diarrhea so they will hold for now and see if the diarrhea resolves

## 2022-07-14 NOTE — Assessment & Plan Note (Signed)
Chronic Blood pressure well controlled CMP Continue Coreg 25 mg twice daily, losartan 25 mg daily, sotalol 120 mg twice daily

## 2022-07-14 NOTE — Assessment & Plan Note (Signed)
Chronic   Lab Results  Component Value Date   HGBA1C 6.2 (H) 01/06/2022   Sugars well controlled He is not currently checking sugars Check A1c Continue Janumet 50-1000 mg daily Stressed regular exercise, diabetic diet

## 2022-07-14 NOTE — Assessment & Plan Note (Signed)
New Has been experiencing diarrhea a few times a week-not necessarily on a daily basis, but he is not a good historian Started possibly after starting [place] which may be the cause He will hold Namenda for a week or so and see if that stops it If it does not he will start monitoring the diarrhea and how often it is coming

## 2022-07-14 NOTE — Assessment & Plan Note (Signed)
History of TIA Continue atorvastatin 20 mg daily, Plavix 75 mg daily Blood pressure adequately controlled when rechecked

## 2022-07-14 NOTE — Assessment & Plan Note (Signed)
Chronic Balance is poor-not very active because of that Has seen orthopedics and follows with neurology Has known L5 radiculopathy on the left and diabetic peripheral neuropathy-both of which may be contributing Will order home PT Encouraged regular walking

## 2022-07-14 NOTE — Assessment & Plan Note (Signed)
Chronic Euvolemic on exam Follows with cardiology Continue current medications

## 2022-07-17 ENCOUNTER — Telehealth: Payer: Self-pay | Admitting: Internal Medicine

## 2022-07-17 DIAGNOSIS — G8929 Other chronic pain: Secondary | ICD-10-CM | POA: Diagnosis not present

## 2022-07-17 DIAGNOSIS — I5022 Chronic systolic (congestive) heart failure: Secondary | ICD-10-CM | POA: Diagnosis not present

## 2022-07-17 DIAGNOSIS — R002 Palpitations: Secondary | ICD-10-CM | POA: Diagnosis not present

## 2022-07-17 DIAGNOSIS — E1151 Type 2 diabetes mellitus with diabetic peripheral angiopathy without gangrene: Secondary | ICD-10-CM | POA: Diagnosis not present

## 2022-07-17 DIAGNOSIS — F0390 Unspecified dementia without behavioral disturbance: Secondary | ICD-10-CM | POA: Diagnosis not present

## 2022-07-17 DIAGNOSIS — E782 Mixed hyperlipidemia: Secondary | ICD-10-CM | POA: Diagnosis not present

## 2022-07-17 DIAGNOSIS — M5416 Radiculopathy, lumbar region: Secondary | ICD-10-CM | POA: Diagnosis not present

## 2022-07-17 DIAGNOSIS — E1142 Type 2 diabetes mellitus with diabetic polyneuropathy: Secondary | ICD-10-CM | POA: Diagnosis not present

## 2022-07-17 DIAGNOSIS — I110 Hypertensive heart disease with heart failure: Secondary | ICD-10-CM | POA: Diagnosis not present

## 2022-07-17 NOTE — Telephone Encounter (Signed)
Roselie Awkward with [facility] called stated PT did an assessment today would like verbals for 1 week 1, 2 week 3 and 1 week 3. Best callback is [phone]. Also states there is a level 2 drug interaction with sotalol and donepezil.

## 2022-07-18 DIAGNOSIS — G4733 Obstructive sleep apnea (adult) (pediatric): Secondary | ICD-10-CM | POA: Diagnosis not present

## 2022-07-18 NOTE — Telephone Encounter (Signed)
Okay for orders? 

## 2022-07-20 DIAGNOSIS — I5022 Chronic systolic (congestive) heart failure: Secondary | ICD-10-CM | POA: Diagnosis not present

## 2022-07-20 DIAGNOSIS — E782 Mixed hyperlipidemia: Secondary | ICD-10-CM | POA: Diagnosis not present

## 2022-07-20 DIAGNOSIS — I110 Hypertensive heart disease with heart failure: Secondary | ICD-10-CM | POA: Diagnosis not present

## 2022-07-20 DIAGNOSIS — M5416 Radiculopathy, lumbar region: Secondary | ICD-10-CM | POA: Diagnosis not present

## 2022-07-20 DIAGNOSIS — E1151 Type 2 diabetes mellitus with diabetic peripheral angiopathy without gangrene: Secondary | ICD-10-CM | POA: Diagnosis not present

## 2022-07-20 DIAGNOSIS — E1142 Type 2 diabetes mellitus with diabetic polyneuropathy: Secondary | ICD-10-CM | POA: Diagnosis not present

## 2022-07-20 DIAGNOSIS — F0390 Unspecified dementia without behavioral disturbance: Secondary | ICD-10-CM | POA: Diagnosis not present

## 2022-07-20 DIAGNOSIS — R002 Palpitations: Secondary | ICD-10-CM | POA: Diagnosis not present

## 2022-07-20 DIAGNOSIS — G8929 Other chronic pain: Secondary | ICD-10-CM | POA: Diagnosis not present

## 2022-07-20 NOTE — Telephone Encounter (Signed)
Verbals given to [place] today.

## 2022-07-21 ENCOUNTER — Telehealth: Payer: Self-pay | Admitting: Internal Medicine

## 2022-07-21 NOTE — Telephone Encounter (Signed)
Clearance placed in blue folder today.  Awaiting Dr. Quay Burow completion.

## 2022-07-21 NOTE — Telephone Encounter (Signed)
Medical clearance forms have been received via fax, will be in providers box up front.

## 2022-07-23 DIAGNOSIS — R002 Palpitations: Secondary | ICD-10-CM | POA: Diagnosis not present

## 2022-07-23 DIAGNOSIS — G8929 Other chronic pain: Secondary | ICD-10-CM | POA: Diagnosis not present

## 2022-07-23 DIAGNOSIS — E1151 Type 2 diabetes mellitus with diabetic peripheral angiopathy without gangrene: Secondary | ICD-10-CM | POA: Diagnosis not present

## 2022-07-23 DIAGNOSIS — I110 Hypertensive heart disease with heart failure: Secondary | ICD-10-CM | POA: Diagnosis not present

## 2022-07-23 DIAGNOSIS — M5416 Radiculopathy, lumbar region: Secondary | ICD-10-CM | POA: Diagnosis not present

## 2022-07-23 DIAGNOSIS — E1142 Type 2 diabetes mellitus with diabetic polyneuropathy: Secondary | ICD-10-CM | POA: Diagnosis not present

## 2022-07-23 DIAGNOSIS — F0390 Unspecified dementia without behavioral disturbance: Secondary | ICD-10-CM | POA: Diagnosis not present

## 2022-07-23 DIAGNOSIS — E782 Mixed hyperlipidemia: Secondary | ICD-10-CM | POA: Diagnosis not present

## 2022-07-23 DIAGNOSIS — I5022 Chronic systolic (congestive) heart failure: Secondary | ICD-10-CM | POA: Diagnosis not present

## 2022-07-27 DIAGNOSIS — E1142 Type 2 diabetes mellitus with diabetic polyneuropathy: Secondary | ICD-10-CM | POA: Diagnosis not present

## 2022-07-27 DIAGNOSIS — M5416 Radiculopathy, lumbar region: Secondary | ICD-10-CM | POA: Diagnosis not present

## 2022-07-27 DIAGNOSIS — G8929 Other chronic pain: Secondary | ICD-10-CM | POA: Diagnosis not present

## 2022-07-27 DIAGNOSIS — Z950 Presence of cardiac pacemaker: Secondary | ICD-10-CM

## 2022-07-27 DIAGNOSIS — I110 Hypertensive heart disease with heart failure: Secondary | ICD-10-CM | POA: Diagnosis not present

## 2022-07-27 DIAGNOSIS — Z7902 Long term (current) use of antithrombotics/antiplatelets: Secondary | ICD-10-CM

## 2022-07-27 DIAGNOSIS — Z9181 History of falling: Secondary | ICD-10-CM

## 2022-07-27 DIAGNOSIS — E538 Deficiency of other specified B group vitamins: Secondary | ICD-10-CM

## 2022-07-27 DIAGNOSIS — I5022 Chronic systolic (congestive) heart failure: Secondary | ICD-10-CM | POA: Diagnosis not present

## 2022-07-27 DIAGNOSIS — R002 Palpitations: Secondary | ICD-10-CM | POA: Diagnosis not present

## 2022-07-27 DIAGNOSIS — R197 Diarrhea, unspecified: Secondary | ICD-10-CM

## 2022-07-27 DIAGNOSIS — Z8673 Personal history of transient ischemic attack (TIA), and cerebral infarction without residual deficits: Secondary | ICD-10-CM

## 2022-07-27 DIAGNOSIS — F0390 Unspecified dementia without behavioral disturbance: Secondary | ICD-10-CM | POA: Diagnosis not present

## 2022-07-27 DIAGNOSIS — E782 Mixed hyperlipidemia: Secondary | ICD-10-CM | POA: Diagnosis not present

## 2022-07-27 DIAGNOSIS — E1151 Type 2 diabetes mellitus with diabetic peripheral angiopathy without gangrene: Secondary | ICD-10-CM | POA: Diagnosis not present

## 2022-07-30 ENCOUNTER — Ambulatory Visit (HOSPITAL_BASED_OUTPATIENT_CLINIC_OR_DEPARTMENT_OTHER): Payer: Medicare PPO | Admitting: Pulmonary Disease

## 2022-07-30 ENCOUNTER — Encounter (HOSPITAL_BASED_OUTPATIENT_CLINIC_OR_DEPARTMENT_OTHER): Payer: Self-pay | Admitting: Pulmonary Disease

## 2022-07-30 VITALS — BP 130/72 | HR 68 | Ht 71.0 in | Wt 192.9 lb

## 2022-07-30 DIAGNOSIS — R002 Palpitations: Secondary | ICD-10-CM | POA: Diagnosis not present

## 2022-07-30 DIAGNOSIS — G2581 Restless legs syndrome: Secondary | ICD-10-CM | POA: Diagnosis not present

## 2022-07-30 DIAGNOSIS — G4733 Obstructive sleep apnea (adult) (pediatric): Secondary | ICD-10-CM | POA: Diagnosis not present

## 2022-07-30 DIAGNOSIS — I5022 Chronic systolic (congestive) heart failure: Secondary | ICD-10-CM | POA: Diagnosis not present

## 2022-07-30 DIAGNOSIS — G8929 Other chronic pain: Secondary | ICD-10-CM | POA: Diagnosis not present

## 2022-07-30 DIAGNOSIS — M5416 Radiculopathy, lumbar region: Secondary | ICD-10-CM | POA: Diagnosis not present

## 2022-07-30 DIAGNOSIS — E782 Mixed hyperlipidemia: Secondary | ICD-10-CM | POA: Diagnosis not present

## 2022-07-30 DIAGNOSIS — E1142 Type 2 diabetes mellitus with diabetic polyneuropathy: Secondary | ICD-10-CM | POA: Diagnosis not present

## 2022-07-30 DIAGNOSIS — F0390 Unspecified dementia without behavioral disturbance: Secondary | ICD-10-CM | POA: Diagnosis not present

## 2022-07-30 DIAGNOSIS — E1151 Type 2 diabetes mellitus with diabetic peripheral angiopathy without gangrene: Secondary | ICD-10-CM | POA: Diagnosis not present

## 2022-07-30 DIAGNOSIS — I110 Hypertensive heart disease with heart failure: Secondary | ICD-10-CM | POA: Diagnosis not present

## 2022-07-30 NOTE — Assessment & Plan Note (Signed)
Iron levels okay. Symptoms are not to be worse after stopping gabapentin

## 2022-07-30 NOTE — Patient Instructions (Signed)
CPAP is working well 

## 2022-07-30 NOTE — Assessment & Plan Note (Signed)
CPAP download was reviewed on auto settings 5 to 10 cm which shows excellent control of events and minimal leak.  He is very compliant sometimes up to 10 hours per night and CPAP is only helped his daytime somnolence and fatigue.  This is also helping his memory compliance with goal of at least 4-6 hrs every night is the expectation. Advised against medications with sedative side effects Cautioned against driving when sleepy - understanding that sleepiness will vary on a day to day basis

## 2022-07-30 NOTE — Progress Notes (Signed)
   Subjective:    Patient ID: [name], male    DOB: 1950/07/29, 72 y.o.   MRN: [id number]  HPI  72 yo for follow-up of OSA and restless legs   PMH -chronic systolic heart failure status post AICD for VT,  hypertension,  diabetes type 2,  prostate cancer Restless leg syndrome Vascular dementia   OSA was diagnosed in 2005 but he could not tolerate CPAP.  We reinitiated CPAP in 2023 after split-night study He lost 40 pounds since his COVID infection in 2022   Chief Complaint  Patient presents with   Follow-up    Pt states he has been doing okay since last visit and denies any complaints.   On his last office visit 11/2021, we checked iron levels which were okay and asked him to take  gabapentin 200 mg at bedtime. His CPAP compliance had improved He was also started on losartan at Night and developed dizziness, and therefore gabapentin had to be stopped. He has settled down with his CPAP and denies any problems with mask or pressure he is able to use this for good.  [name] is sleeping well.  Wife corroborates this history. He continues to have memory issues  Blood pressure is better controlled Legs do not bother him as much during sleep   Significant tests/ events reviewed   08/2021 split-night study mild OSA AHI 9.5/hour, RDI 50/hour, minimum desaturation 90%  01/2004 NPSG - wt 235 lbs -RDI 37/hour corrected by CPAP 7 cm  Review of Systems neg for any significant sore throat, dysphagia, itching, sneezing, nasal congestion or excess/ purulent secretions, fever, chills, sweats, unintended wt loss, pleuritic or exertional cp, hempoptysis, orthopnea pnd or change in chronic leg swelling. Also denies presyncope, palpitations, heartburn, abdominal pain, nausea, vomiting, diarrhea or change in bowel or urinary habits, dysuria,hematuria, rash, arthralgias, visual complaints, headache, numbness weakness or ataxia.     Objective:   Physical Exam   Gen. Pleasant, well-nourished, in  no distress ENT - no thrush, no pallor/icterus,no post nasal drip Neck: No JVD, no thyromegaly, no carotid bruits Lungs: no use of accessory muscles, no dullness to percussion, clear without rales or rhonchi  Cardiovascular: Rhythm regular, heart sounds  normal, no murmurs or gallops, no peripheral edema Musculoskeletal: No deformities, no cyanosis or clubbing         Assessment & Plan:

## 2022-08-04 DIAGNOSIS — E1151 Type 2 diabetes mellitus with diabetic peripheral angiopathy without gangrene: Secondary | ICD-10-CM | POA: Diagnosis not present

## 2022-08-04 DIAGNOSIS — I110 Hypertensive heart disease with heart failure: Secondary | ICD-10-CM | POA: Diagnosis not present

## 2022-08-04 DIAGNOSIS — E782 Mixed hyperlipidemia: Secondary | ICD-10-CM | POA: Diagnosis not present

## 2022-08-04 DIAGNOSIS — F0390 Unspecified dementia without behavioral disturbance: Secondary | ICD-10-CM | POA: Diagnosis not present

## 2022-08-04 DIAGNOSIS — E1142 Type 2 diabetes mellitus with diabetic polyneuropathy: Secondary | ICD-10-CM | POA: Diagnosis not present

## 2022-08-04 DIAGNOSIS — I5022 Chronic systolic (congestive) heart failure: Secondary | ICD-10-CM | POA: Diagnosis not present

## 2022-08-04 DIAGNOSIS — R002 Palpitations: Secondary | ICD-10-CM | POA: Diagnosis not present

## 2022-08-04 DIAGNOSIS — G8929 Other chronic pain: Secondary | ICD-10-CM | POA: Diagnosis not present

## 2022-08-04 DIAGNOSIS — M5416 Radiculopathy, lumbar region: Secondary | ICD-10-CM | POA: Diagnosis not present

## 2022-08-06 ENCOUNTER — Encounter: Payer: Self-pay | Admitting: Internal Medicine

## 2022-08-06 DIAGNOSIS — E782 Mixed hyperlipidemia: Secondary | ICD-10-CM | POA: Diagnosis not present

## 2022-08-06 DIAGNOSIS — G8929 Other chronic pain: Secondary | ICD-10-CM | POA: Diagnosis not present

## 2022-08-06 DIAGNOSIS — M5416 Radiculopathy, lumbar region: Secondary | ICD-10-CM | POA: Diagnosis not present

## 2022-08-06 DIAGNOSIS — I5022 Chronic systolic (congestive) heart failure: Secondary | ICD-10-CM | POA: Diagnosis not present

## 2022-08-06 DIAGNOSIS — F0390 Unspecified dementia without behavioral disturbance: Secondary | ICD-10-CM | POA: Diagnosis not present

## 2022-08-06 DIAGNOSIS — I110 Hypertensive heart disease with heart failure: Secondary | ICD-10-CM | POA: Diagnosis not present

## 2022-08-06 DIAGNOSIS — E1142 Type 2 diabetes mellitus with diabetic polyneuropathy: Secondary | ICD-10-CM | POA: Diagnosis not present

## 2022-08-06 DIAGNOSIS — E1151 Type 2 diabetes mellitus with diabetic peripheral angiopathy without gangrene: Secondary | ICD-10-CM | POA: Diagnosis not present

## 2022-08-06 DIAGNOSIS — R002 Palpitations: Secondary | ICD-10-CM | POA: Diagnosis not present

## 2022-08-06 NOTE — Telephone Encounter (Signed)
Anna with [facility] called back to check status of clearance form. Please return call at [phone]

## 2022-08-08 ENCOUNTER — Other Ambulatory Visit: Payer: Self-pay | Admitting: Physician Assistant

## 2022-08-10 DIAGNOSIS — L723 Sebaceous cyst: Secondary | ICD-10-CM | POA: Diagnosis not present

## 2022-08-10 DIAGNOSIS — L089 Local infection of the skin and subcutaneous tissue, unspecified: Secondary | ICD-10-CM | POA: Diagnosis not present

## 2022-08-10 NOTE — Telephone Encounter (Signed)
Called and requested to have form resent.

## 2022-08-10 NOTE — Progress Notes (Signed)
NEUROLOGY FOLLOW UP OFFICE NOTE  [name] [id number]  Subjective:  [name] is a 72 y.o. year old right-handed male with a medical history of DM, HTN, HLD, OA, prostate cancer (~2005 s/p surgery and radiation), OSA (on CPAP), cardiomyopathy c/b HFrEF s/p PM, TIA on 01/05/22 who we last saw on 02/11/22.  To briefly review: Patient has aching, stinging, and stabbing pains in his legs from calves into his feet. It keeps him up at night. This has been present for over a decade. It has stayed about the same over the years. It comes and goes and is worse at night. He has tried lidocaine cream which helps with his symptoms. If he gets up and walks around at night, this also helps with symptoms. Patient previously tried gabapentin, but was getting dizzy at night, so he stopped this.   Patient started noticing gait imbalance about 1 year ago. He would lose balance to both sides. He had 1 fall at work. He fell off a ridge that he didn't see. He does not use an assistive device to walk.    Patient was previously seen by Dr. Durward Hernandez in ortho. Per his clinic note from 01/14/22: Plan: [name] was last seen in June for evaluation of back pain associated with bilateral lower extremity weakness.  X-rays of his lumbar and thoracic spine demonstrated significant degenerative changes.  A CT scan of the lumbar spine was obtained as he has a cardiac pacemaker and cannot have an MRI scan.  This demonstrated multilevel facet arthropathy which causes mild mild neural foraminal narrowing bilaterally at L2-3, L3-4 and L4-5 as well as on the right at L1-2 and L5 1.  There was no canal stenosis.  Narrowing of the lateral recesses at L3-4 and L4-5 could affect the descending L4 and L5 nerve roots respectively.  I had Dr. Ernestina Hernandez performed EMGs nerve conduction velocities of both lower extremities.  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  He is diabetic and I would assume that the neuropathy is based on the diabetes.  He continues to have some trouble with his back which is controlled with Tylenol.  He is taking Plavix for his atrial fibrillation.  I would like to try a course of physical therapy.  In the interim he has had a stroke and is receiving home health therapy through [facility] and will contact them to see if they can perform some thoracic and lumbar exercises.  I would also like him to return to the neurologist to evaluate his neuropathy.  I did place him on gabapentin and he notes that this had helped him sleep and he is feeling a little bit better but I like them to follow-up on that.  All questions were answered.   Patient presented to the ED on 01/05/22 for an episode of difficulty talking and mild weakness of the right upper extremity. Symptoms lasted about 15 minutes. CT head showed no acute process (chronic atrophy and microvascular ischemia). CTA head and neck showed no LVO or other emergent finding (no significant carotid disease). Neurology was consulted and recommended ASA and Plavix. HbA1c was 6.2, LDL 64, Echo w/ EF of 35-40%. Plan was for asa 81mg  daily and plavix 75 mg daily for 3 weeks followed by plavix monotherapy. Patient is currently on plavix monotherapy. He was also discharged with 30 day heart monitoring (which he is currently  wearing). MRI brain was not done because he PM was not compatible per wife.   The patient does not report symptoms referable to autonomic dysfunction including impaired sweating, heat or cold intolerance, excessive mucosal dryness, gastroparetic early satiety, postprandial abdominal bloating, constipation, bowel or bladder dyscontrol, or syncope/presyncope/orthostatic intolerance.   He does not report any constitutional symptoms like fever, night sweats, anorexia or unintentional weight loss.   EtOH use: No  Restrictive diet? No Family  history of neuropathy/myopathy/NM disease? No, but family history of dementia  Most recent Assessment and Plan (02/11/22): His neurological examination is pertinent for distal lower extremity sensory loss, positive Romberg, and gait imbalance. Available diagnostic data is significant for B12 of 974, HbA1c of 6.2 and a normal ferritin. 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 An overlapping restless leg syndrome is also possible. I will look for other treatable causes of neuropathy and recheck iron studies due to possible RLS.   PLAN: -Blood work: iron studies, IFE -Continue home PT exercise -Lidocaine cream -Can retry low dose gabapentin (100 mg at night)  Since their last visit: He has no complaints about pain. He does have some imbalance, but denies any falls.  Patient was unable to tolerate gabapentin with losartan (made him to dizziness). He stopped the gabapentin and kept taking the losartan and dizziness resolved.  Labs were normal.  MEDICATIONS:  Outpatient Encounter Medications as of 08/14/2022  Medication Sig   ACCU-CHEK GUIDE test strip USE TO CHECK SUGAR 4 TIMES A DAY   acetaminophen (TYLENOL) 500 MG tablet Take 1,000 mg by mouth every 6 (six) hours as needed for mild pain.   atorvastatin (LIPITOR) 20 MG tablet Take by mouth.   carvedilol (COREG) 25 MG tablet TAKE 1 TABLET TWICE DAILY WITH MEALS   Cholecalciferol (VITAMIN D) 50 MCG (2000 UT) tablet Take 2,000 Units by mouth daily.   clopidogrel (PLAVIX) 75 MG tablet Take 1 tablet (75 mg total) by mouth daily.   digoxin (LANOXIN) 0.125 MG tablet Take 0.5 tablets (0.0625 mg total) by mouth daily.   docusate sodium (COLACE) 100 MG capsule Take 1 capsule (100 mg total) by mouth 2 (two) times daily as needed for mild constipation.   donepezil (ARICEPT) 10 MG tablet TAKE HALF TABLET (5 MG) DAILY FOR 2 WEEKS, THEN INCREASE TO THE FULL TABLET AT 10 MG DAILY (Patient taking  differently: Take 10 mg by mouth daily.)   famotidine (PEPCID) 20 MG tablet Take 1 tablet (20 mg total) by mouth daily.   JANUMET 50-1000 MG tablet TAKE 1 TABLET EVERY DAY WITH A MEAL   Lancets (ONETOUCH ULTRASOFT) lancets Use as instructed to test sugar daily and prn. Dx E11.9   losartan (COZAAR) 25 MG tablet TAKE 1 TABLET (25 MG TOTAL) BY MOUTH AT BEDTIME.   memantine (NAMENDA) 5 MG tablet Take 1 tablet (5 mg at night)   pantoprazole (PROTONIX) 40 MG tablet Take 1 tablet (40 mg total) by mouth daily.   sotalol (BETAPACE) 120 MG tablet TAKE 1 TABLET TWICE DAILY   vitamin B-12 (CYANOCOBALAMIN) 1000 MCG tablet TAKE 1 TABLET EVERY DAY   No facility-administered encounter medications on file as of 08/14/2022.    PAST MEDICAL HISTORY: 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   Not compatible with MRI    Barrett's esophagus 09/05/2004   2 cm changes seen at index  EGD   Cataract    Chronic back pain 04/25/2019   Chronic systolic heart failure    Colon polyps    tubular adenoma   Diabetic neuropathy 01/14/2022   Difficulty in walking 10/02/2021   Dizziness 01/19/2011   Dysrhythmia    Essential hypertension 08/26/2009   Fatigue 12/02/2020   Generalized anxiety disorder    since defibrillator placement   GERD (gastroesophageal reflux disease)    Hip pain 12/14/2016   History of COVID-19    History of myocardial infarction    Hyperlipidemia    Left knee pain 12/14/2016   Low back pain 05/14/2021   Malignant neoplasm of prostate 09/27/2014   s/p prostatectomy   Mild anemia 01/20/2011   Mixed dementia 04/27/2022   Neuralgia 10/19/2018   NICM (nonischemic cardiomyopathy)    a.normal cors by cath in 2005. b. s/p Medtronic ICD.   OSA (obstructive sleep apnea) 11/10/2012   regular CPAP use   Pain due to onychomycosis of toenails of both feet 02/23/2022   Pain  in the coccyx 01/28/2022   Rectal pain 12/21/2021   Right foot pain 12/15/2017   RLS (restless legs syndrome) 11/29/2012   S/P radiation therapy    10/10/2014 through 11/26/2014; Prostate bed 6600 cGy in 33 sessions   Shingles 07/28/2016   TIA (transient ischemic attack) 01/05/2022   Type II diabetes mellitus 08/26/2009   Ventricular tachycardia    a. s/p ICD (generator change 09/2012). b. 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 and placed on sotalol   Visual hallucinations    Vitamin B12 deficiency 03/06/2011   Monthly B12 shots initiated  2011. Nasally  inhaled B12 as of 07/2012    PAST SURGICAL HISTORY: Past Surgical History:  Procedure Laterality Date   CARDIAC DEFIBRILLATOR PLACEMENT  03/11/2004   Medtronic Maximo single lead   COLONOSCOPY W/ BIOPSIES AND POLYPECTOMY  08/2004, 02/24/11   64mm adenoma in 2006,  5 mm polyp (not recovered) 2012   CYSTOSCOPY WITH DIRECT VISION INTERNAL URETHROTOMY N/A 10/08/2015   Procedure: CYSTOSCOPY WITH DIRECT VISION INTERNAL URETHROTOMY;  Surgeon: Hildred Laser, MD;  Location: [facility];  Service: Urology;  Laterality: N/A;   IMPLANTABLE CARDIOVERTER DEFIBRILLATOR GENERATOR CHANGE N/A 10/07/2012   Procedure: IMPLANTABLE CARDIOVERTER DEFIBRILLATOR GENERATOR CHANGE;  Surgeon: Marinus Maw, MD;  Location: [facility] CATH LAB;  Service: Cardiovascular;  Laterality: N/A;   PROSTATECTOMY  06/2003   Dr Brunilda Payor   RECTAL SURGERY     UPPER GASTROINTESTINAL ENDOSCOPY  08/2004, 02/24/11   Barrett's esophagus    ALLERGIES: Allergies  Allergen Reactions   Zantac [Ranitidine Hcl]     itching    FAMILY HISTORY: Family History  Problem Relation Age of Onset   Dementia Mother    Hypertension Mother    Coronary artery disease Father    Prostate cancer Father    Diabetes Father    Dementia Sister    Dementia Brother    Diabetes Paternal Grandmother    Dementia Maternal Aunt    Stroke Neg Hx    Colon cancer Neg Hx    Colon polyps Neg Hx    Esophageal cancer Neg Hx    Rectal  cancer Neg Hx    Stomach cancer Neg Hx     SOCIAL HISTORY: Social History   Tobacco Use   Smoking status: Never   Smokeless tobacco: Never  Vaping Use   Vaping Use: Never used  Substance Use Topics   Alcohol use: No   Drug use: No   Social History  Social History Narrative   Right handed   No caffeine   Two story home   Retired   Lives with wife      Objective:  Vital Signs:  BP (!) 155/82   Pulse 65   Resp 18   Ht 5\' 11"  (1.803 m)   Wt 190 lb (86.2 kg)   SpO2 99%   BMI 26.50 kg/m   General: No acute distress.  Patient appears well-groomed.   Head:  Normocephalic/atraumatic Neck: supple  Neurological Exam: Mental status: alert and oriented, speech fluent and not dysarthric, language intact.  Cranial nerves: CN I: not tested CN II: pupils equal, round and reactive to light, visual fields intact CN III, IV, VI:  full range of motion, no nystagmus, no ptosis CN V: facial sensation intact. CN VII: upper and lower face symmetric CN VIII: hearing intact CN IX, X: gag intact, uvula midline CN XI: sternocleidomastoid and trapezius muscles intact CN XII: tongue midline  Bulk & Tone: normal, no fasciculations. Motor:  muscle strength 5/5 except: 4/5 in right FDI, 4+ in right ADM, 4+ in bilateral APB Deep Tendon Reflexes:  2+ throughout Sensation:  Pinprick sensation intact. Diminished sensation to vibration in bilateral great toes. Finger to nose testing:  Without dysmetria.    Gait:  Normal station and stride. Imbalance with turns.  Labs and Imaging review: New results: 02/11/22: Iron studies normal (ferritin of 124) IFE: no M protein  07/14/22: B12: > 1500 A1c: 5.9  Previously reviewed results: Normal or unremarkable: CMP, HIV, drug screen, EtOH panel, TSH, ferritin, vit D HbA1c (01/06/22): 6.2 (7.3 on 04/13/19) B12 (08/04/21): 974   CT head (01/06/22): FINDINGS: Streak/beam hardening artifact arising from dental restoration partially obscures the  posterior fossa.   Brain:   No age advanced or lobar predominant parenchymal atrophy.   Mild patchy and ill-defined hypoattenuation within the cerebral white matter, nonspecific but compatible with chronic small vessel ischemic disease.   There is no acute intracranial hemorrhage.   No demarcated cortical infarct.   No extra-axial fluid collection.   No evidence of an intracranial mass.   No midline shift.   Vascular: No hyperdense vessel. Atherosclerotic calcifications.   Skull: No fracture or aggressive osseous lesion.   Sinuses/Orbits: No mass or acute finding within the imaged orbits. Trace scattered paranasal sinus mucosal thickening.   IMPRESSION: Streak/beam hardening artifact partially obscures the posterior fossa.   Within this limitation, no evidence of acute intracranial abnormality.   Mild chronic small vessel ischemic changes within the cerebral white matter.   CTA head and neck (01/05/22): FINDINGS: CTA NECK FINDINGS   Aortic arch: Visualized aortic arch normal caliber with normal branch pattern. No stenosis about the origin the great vessels.   Right carotid system: Right common and internal carotid arteries widely patent without stenosis, dissection or occlusion. Mild atheromatous plaque about the right carotid bulb without stenosis.   Left carotid system: Left common and internal carotid arteries widely patent without stenosis, dissection or occlusion.   Vertebral arteries: Both vertebral arteries arise from the subclavian arteries. Right vertebral artery dominant, with a diffusely hypoplastic left vertebral artery. Vertebral arteries patent without stenosis or dissection.   Skeleton: No discrete or worrisome osseous lesions. Mild cervical spondylosis for age.   Other neck: 1.6 cm cystic lesion within the subcutaneous fat of the mid upper posterior neck, indeterminate, but likely a sebaceous cyst (series 5, image 77). Asymmetric fatty  atrophy of the left parotid gland noted. No other acute soft  tissue abnormality within the neck.   Upper chest: Left-sided pacemaker/AICD partially visualized. Visualized upper chest demonstrates no acute finding.   Review of the MIP images confirms the above findings   CTA HEAD FINDINGS   Anterior circulation: Petrous segments patent bilaterally. Mild atheromatous change within the carotid siphons without significant stenosis. A1 segments, anterior communicating artery complex common anterior cerebral arteries widely patent. No M1 stenosis or occlusion. No proximal MCA branch occlusion. Distal MCA branches perfused and symmetric.   Posterior circulation: Dominant right vertebral artery widely patent. Left vertebral artery largely terminates in PICA. Both PICA patent. Basilar patent to its distal aspect without stenosis. Superior cerebral arteries patent bilaterally. Both PCAs widely patent and well perfused or distal aspects.   Venous sinuses: Patent allowing for timing the contrast bolus.   Anatomic variants: None significant.  No aneurysm.   Review of the MIP images confirms the above findings   IMPRESSION: 1. Negative CTA for large vessel occlusion or other emergent finding. 2. Mild for age atheromatous disease without hemodynamically significant or correctable stenosis. 3. 1.6 cm cystic lesion within the subcutaneous fat of the mid upper posterior neck, indeterminate, but likely a sebaceous cyst. Correlation with physical exam suggested.   CT lumbar spine wo contrast (10/24/21): FINDINGS: Segmentation: 5 lumbar-type vertebral bodies, with hypoplastic ribs at T12.   Alignment: Normal.   Vertebrae: No acute fracture or suspicious osseous lesion. Vertebral body heights are maintained.   Paraspinal and other soft tissues: Negative.   Disc levels:   T12-L1: Minimal disc bulge. Mild-to-moderate facet arthropathy. No spinal canal stenosis no neural foraminal  narrowing.   L1-L2: Minimal disc bulge. Mild-to-moderate facet arthropathy. No spinal canal stenosis. Mild right neural foraminal narrowing.   L2-L3: Minimal disc bulge. Moderate facet arthropathy. Ligamentum flavum hypertrophy. No spinal canal stenosis. Mild bilateral neural foraminal narrowing.   L3-L4: Mild disc bulge. Right-greater-than-left mild-to-moderate facet arthropathy. Ligamentum flavum hypertrophy. Narrowing of the lateral recesses. No spinal canal stenosis. Mild bilateral neural foraminal narrowing.   L4-L5: Moderate disc bulge. Mild-to-moderate right-greater-than-left facet arthropathy. No spinal canal stenosis. Narrowing of the lateral recesses. Mild bilateral neural foraminal narrowing.   L5-S1: Minimal disc bulge. Moderate facet arthropathy. No spinal canal stenosis. Mild right neural foraminal narrowing.   IMPRESSION: 1. Multilevel facet arthropathy, which causes mild neural foraminal narrowing bilaterally at L2-L3, L3-L4, and L4-L5, as well as on the right at L1-L2 and L5-S1. 2. No spinal canal stenosis. Narrowing of the lateral recesses at L3-L4 and L4-L5 could affect the descending L4 and L5 nerve roots, respectively.   EMG (12/18/21 by Dr. Alvester Morin): EMG & NCV Findings (see tables and wave forms in Epic): Evaluation of the left tibial motor and the right superficial fibular sensory nerves showed reduced amplitude (L0.3, R2.4 V).  The right tibial motor nerve showed prolonged distal onset latency (6.3 ms) and reduced amplitude (0.3 mV).  The left superficial fibular sensory nerve showed prolonged distal peak latency (4.8 ms) and decreased conduction velocity (14 cm-Ant Lat Mall, 29 m/s).  The left sural sensory and the right sural sensory nerves showed no response (Calf).  All remaining nerves (as indicated in the following tables) were within normal limits.  Left vs. Right side comparison data for the tibial motor nerve indicates abnormal L-R latency difference  (1.8 ms) and abnormal L-R velocity difference (Knee-Ankle, 11 m/s).     Needle evaluation of the left anterior tibialis and the left Fibularis Longus muscles showed increased insertional activity.  All remaining muscles (as indicated  in the following table) showed no evidence of electrical instability.     Impression: The above electrodiagnostic study is ABNORMAL and reveals evidence of mild chronic L5 radiculopathy on the left.     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    There is no significant electrodiagnostic evidence of any other focal nerve entrapment or lumbosacral plexopathy.  --- **Per my read of the tables, the above mentioned neuropathy appears axonal in nature.**  Assessment/Plan:  This is [name], a 72 y.o. 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 Patient was unable to tolerate gabapentin when combined with losartan due to dizziness/imbalance. He reports no significant pain today. He has ongoing imbalance, but no falls. He also has weakness of the right hand in ulnar innervated muscles that patient had not noticed. He has atrophy of the FDI and ADM muscles that suggest longstanding ulnar neuropathy, likely at the elbow. Patient is not bothered by these symptoms currently.  Plan: -Lidocaine cream PRN for tingling or burning -Recommended patient be careful to not lean on elbows -Fall precautions discussed and given to patient -Follow up with Marlowe Kays as planned for dementia  Return to clinic as needed   Jacquelyne Balint, MD

## 2022-08-11 DIAGNOSIS — E1142 Type 2 diabetes mellitus with diabetic polyneuropathy: Secondary | ICD-10-CM | POA: Diagnosis not present

## 2022-08-11 DIAGNOSIS — I110 Hypertensive heart disease with heart failure: Secondary | ICD-10-CM | POA: Diagnosis not present

## 2022-08-11 DIAGNOSIS — G8929 Other chronic pain: Secondary | ICD-10-CM | POA: Diagnosis not present

## 2022-08-11 DIAGNOSIS — E1151 Type 2 diabetes mellitus with diabetic peripheral angiopathy without gangrene: Secondary | ICD-10-CM | POA: Diagnosis not present

## 2022-08-11 DIAGNOSIS — F0390 Unspecified dementia without behavioral disturbance: Secondary | ICD-10-CM | POA: Diagnosis not present

## 2022-08-11 DIAGNOSIS — I5022 Chronic systolic (congestive) heart failure: Secondary | ICD-10-CM | POA: Diagnosis not present

## 2022-08-11 DIAGNOSIS — R002 Palpitations: Secondary | ICD-10-CM | POA: Diagnosis not present

## 2022-08-11 DIAGNOSIS — M5416 Radiculopathy, lumbar region: Secondary | ICD-10-CM | POA: Diagnosis not present

## 2022-08-11 DIAGNOSIS — E782 Mixed hyperlipidemia: Secondary | ICD-10-CM | POA: Diagnosis not present

## 2022-08-14 ENCOUNTER — Encounter: Payer: Self-pay | Admitting: Neurology

## 2022-08-14 ENCOUNTER — Ambulatory Visit: Payer: Medicare PPO | Admitting: Neurology

## 2022-08-14 VITALS — BP 155/82 | HR 65 | Resp 18 | Ht 71.0 in | Wt 190.0 lb

## 2022-08-14 DIAGNOSIS — E1142 Type 2 diabetes mellitus with diabetic polyneuropathy: Secondary | ICD-10-CM | POA: Diagnosis not present

## 2022-08-14 DIAGNOSIS — R262 Difficulty in walking, not elsewhere classified: Secondary | ICD-10-CM

## 2022-08-14 DIAGNOSIS — R2681 Unsteadiness on feet: Secondary | ICD-10-CM

## 2022-08-14 DIAGNOSIS — G5621 Lesion of ulnar nerve, right upper limb: Secondary | ICD-10-CM

## 2022-08-14 NOTE — Patient Instructions (Signed)
Preventing Falls at Home  Falls are common, often dreaded events in the lives of older people. Aside from the obvious injuries and even death that may result, fall can cause wide-ranging consequences including loss of independence, mental decline, decreased activity and mobility. Younger people are also at risk of falling, especially those with chronic illnesses and fatigue.  Ways to reduce risk for falling Examine diet and medications. Warm foods and alcohol dilate blood vessels, which can lead to dizziness when standing. Sleep aids, antidepressants and pain medications can also increase the likelihood of a fall.  Get a vision exam. Poor vision, cataracts and glaucoma increase the chances of falling.  Check foot gear. Shoes should fit snugly and have a sturdy, nonskid sole and a broad, low heel  Participate in a physician-approved exercise program to build and maintain muscle strength and improve balance and coordination. Programs that use ankle weights or stretch bands are excellent for muscle-strengthening. Water aerobics programs and low-impact Tai Chi programs have also been shown to improve balance and coordination.  Increase vitamin D intake. Vitamin D improves muscle strength and increases the amount of calcium the body is able to absorb and deposit in bones.  How to prevent falls from common hazards Floors - Remove all loose wires, cords, and throw rugs. Minimize clutter. Make sure rugs are anchored and smooth. Keep furniture in its usual place.  Chairs -- Use chairs with straight backs, armrests and firm seats. Add firm cushions to existing pieces to add height.  Bathroom - Install grab bars and non-skid tape in the tub or shower. Use a bathtub transfer bench or a shower chair with a back support Use an elevated toilet seat and/or safety rails to assist standing from a low surface. Do not use towel racks or bathroom tissue holders to help you stand.  Lighting - Make sure halls,  stairways, and entrances are well-lit. Install a night light in your bathroom or hallway. Make sure there is a light switch at the top and bottom of the staircase. Turn lights on if you get up in the middle of the night. Make sure lamps or light switches are within reach of the bed if you have to get up during the night.  Kitchen - Install non-skid rubber mats near the sink and stove. Clean spills immediately. Store frequently used utensils, pots, pans between waist and eye level. This helps prevent reaching and bending. Sit when getting things out of lower cupboards.  Living room/ Bedrooms - Place furniture with wide spaces in between, giving enough room to move around. Establish a route through the living room that gives you something to hold onto as you walk.  Stairs - Make sure treads, rails, and rugs are secure. Install a rail on both sides of the stairs. If stairs are a threat, it might be helpful to arrange most of your activities on the lower level to reduce the number of times you must climb the stairs.  Entrances and doorways - Install metal handles on the walls adjacent to the doorknobs of all doors to make it more secure as you travel through the doorway.  Tips for maintaining balance Keep at least one hand free at all times. Try using a backpack or fanny pack to hold things rather than carrying them in your hands. Never carry objects in both hands when walking as this interferes with keeping your balance.  Attempt to swing both arms from front to back while walking. This might require a conscious   effort if Parkinson's disease has diminished your movement. It will, however, help you to maintain balance and posture, and reduce fatigue.  Consciously lift your feet off of the ground when walking. Shuffling and dragging of the feet is a common culprit in losing your balance.  When trying to navigate turns, use a "U" technique of facing forward and making a wide turn, rather than pivoting  sharply.  Try to stand with your feet shoulder-length apart. When your feet are close together for any length of time, you increase your risk of losing your balance and falling.  Do one thing at a time. Don't try to walk and accomplish another task, such as reading or looking around. The decrease in your automatic reflexes complicates motor function, so the less distraction, the better.  Do not wear rubber or gripping soled shoes, they might "catch" on the floor and cause tripping.  Move slowly when changing positions. Use deliberate, concentrated movements and, if needed, use a grab bar or walking aid. Count 15 seconds between each movement. For example, when rising from a seated position, wait 15 seconds after standing to begin walking.  If balance is a continuous problem, you might want to consider a walking aid such as a cane, walking stick, or walker. Once you've mastered walking with help, you might be ready to try it on your own again.   Follow up with me as needed.  The physicians and staff at [facility] are committed to providing excellent care. You may receive a survey requesting feedback about your experience at our office. We strive to receive "very good" responses to the survey questions. If you feel that your experience would prevent you from giving the office a "very good " response, please contact our office to try to remedy the situation. We may be reached at [phone]. Thank you for taking the time out of your busy day to complete the survey.  Jacquelyne Balint, MD [facility]

## 2022-08-17 ENCOUNTER — Other Ambulatory Visit: Payer: Self-pay | Admitting: Internal Medicine

## 2022-08-17 DIAGNOSIS — E782 Mixed hyperlipidemia: Secondary | ICD-10-CM | POA: Diagnosis not present

## 2022-08-17 DIAGNOSIS — E1151 Type 2 diabetes mellitus with diabetic peripheral angiopathy without gangrene: Secondary | ICD-10-CM | POA: Diagnosis not present

## 2022-08-17 DIAGNOSIS — R002 Palpitations: Secondary | ICD-10-CM | POA: Diagnosis not present

## 2022-08-17 DIAGNOSIS — I110 Hypertensive heart disease with heart failure: Secondary | ICD-10-CM | POA: Diagnosis not present

## 2022-08-17 DIAGNOSIS — F0390 Unspecified dementia without behavioral disturbance: Secondary | ICD-10-CM | POA: Diagnosis not present

## 2022-08-17 DIAGNOSIS — I5022 Chronic systolic (congestive) heart failure: Secondary | ICD-10-CM | POA: Diagnosis not present

## 2022-08-17 DIAGNOSIS — M5416 Radiculopathy, lumbar region: Secondary | ICD-10-CM | POA: Diagnosis not present

## 2022-08-17 DIAGNOSIS — E1142 Type 2 diabetes mellitus with diabetic polyneuropathy: Secondary | ICD-10-CM | POA: Diagnosis not present

## 2022-08-17 DIAGNOSIS — G8929 Other chronic pain: Secondary | ICD-10-CM | POA: Diagnosis not present

## 2022-08-18 DIAGNOSIS — G4733 Obstructive sleep apnea (adult) (pediatric): Secondary | ICD-10-CM | POA: Diagnosis not present

## 2022-08-19 DIAGNOSIS — F0390 Unspecified dementia without behavioral disturbance: Secondary | ICD-10-CM | POA: Diagnosis not present

## 2022-08-19 DIAGNOSIS — E1142 Type 2 diabetes mellitus with diabetic polyneuropathy: Secondary | ICD-10-CM | POA: Diagnosis not present

## 2022-08-19 DIAGNOSIS — E782 Mixed hyperlipidemia: Secondary | ICD-10-CM | POA: Diagnosis not present

## 2022-08-19 DIAGNOSIS — E1151 Type 2 diabetes mellitus with diabetic peripheral angiopathy without gangrene: Secondary | ICD-10-CM | POA: Diagnosis not present

## 2022-08-19 DIAGNOSIS — G8929 Other chronic pain: Secondary | ICD-10-CM | POA: Diagnosis not present

## 2022-08-19 DIAGNOSIS — I110 Hypertensive heart disease with heart failure: Secondary | ICD-10-CM | POA: Diagnosis not present

## 2022-08-19 DIAGNOSIS — M5416 Radiculopathy, lumbar region: Secondary | ICD-10-CM | POA: Diagnosis not present

## 2022-08-19 DIAGNOSIS — R002 Palpitations: Secondary | ICD-10-CM | POA: Diagnosis not present

## 2022-08-19 DIAGNOSIS — I5022 Chronic systolic (congestive) heart failure: Secondary | ICD-10-CM | POA: Diagnosis not present

## 2022-08-26 ENCOUNTER — Other Ambulatory Visit: Payer: Self-pay | Admitting: Physician Assistant

## 2022-08-26 DIAGNOSIS — G8929 Other chronic pain: Secondary | ICD-10-CM | POA: Diagnosis not present

## 2022-08-26 DIAGNOSIS — I5022 Chronic systolic (congestive) heart failure: Secondary | ICD-10-CM | POA: Diagnosis not present

## 2022-08-26 DIAGNOSIS — I110 Hypertensive heart disease with heart failure: Secondary | ICD-10-CM | POA: Diagnosis not present

## 2022-08-26 DIAGNOSIS — M5416 Radiculopathy, lumbar region: Secondary | ICD-10-CM | POA: Diagnosis not present

## 2022-08-26 DIAGNOSIS — R002 Palpitations: Secondary | ICD-10-CM | POA: Diagnosis not present

## 2022-08-26 DIAGNOSIS — E782 Mixed hyperlipidemia: Secondary | ICD-10-CM | POA: Diagnosis not present

## 2022-08-26 DIAGNOSIS — F0390 Unspecified dementia without behavioral disturbance: Secondary | ICD-10-CM | POA: Diagnosis not present

## 2022-08-26 DIAGNOSIS — E1142 Type 2 diabetes mellitus with diabetic polyneuropathy: Secondary | ICD-10-CM | POA: Diagnosis not present

## 2022-08-26 DIAGNOSIS — E1151 Type 2 diabetes mellitus with diabetic peripheral angiopathy without gangrene: Secondary | ICD-10-CM | POA: Diagnosis not present

## 2022-09-02 ENCOUNTER — Other Ambulatory Visit: Payer: Self-pay | Admitting: Internal Medicine

## 2022-09-09 ENCOUNTER — Encounter: Payer: Self-pay | Admitting: Podiatry

## 2022-09-09 ENCOUNTER — Ambulatory Visit: Payer: Medicare PPO | Admitting: Podiatry

## 2022-09-09 DIAGNOSIS — M79675 Pain in left toe(s): Secondary | ICD-10-CM

## 2022-09-09 DIAGNOSIS — M79674 Pain in right toe(s): Secondary | ICD-10-CM | POA: Diagnosis not present

## 2022-09-09 DIAGNOSIS — B351 Tinea unguium: Secondary | ICD-10-CM | POA: Diagnosis not present

## 2022-09-09 DIAGNOSIS — E0842 Diabetes mellitus due to underlying condition with diabetic polyneuropathy: Secondary | ICD-10-CM

## 2022-09-09 NOTE — Progress Notes (Signed)
This patient returns to my office for at risk foot care.  This patient requires this care by a professional since this patient will be at risk due to having  diabetes.  This patient is unable to cut nails himself since the patient cannot reach his nails.These nails are painful walking and wearing shoes.  This patient presents for at risk foot care today.  General Appearance  Alert, conversant and in no acute stress.  Vascular  Dorsalis pedis and posterior tibial  pulses are palpable  bilaterally.  Capillary return is within normal limits  bilaterally. Temperature is within normal limits  bilaterally.  Neurologic  Senn-Weinstein monofilament wire test within normal limits  bilaterally. Muscle power within normal limits bilaterally.  Nails Thick disfigured discolored nails with subungual debris  from hallux to fifth toes bilaterally. No evidence of bacterial infection or drainage bilaterally.  Orthopedic  No limitations of motion  feet .  No crepitus or effusions noted.  No bony pathology or digital deformities noted.  Skin  normotropic skin with no porokeratosis noted bilaterally.  No signs of infections or ulcers noted.     Onychomycosis  Pain in right toes  Pain in left toes  Consent was obtained for treatment procedures.   Mechanical debridement of nails 1-5  bilaterally performed with a nail nipper.  Filed with dremel without incident.    Return office visit   3 months                   Told patient to return for periodic foot care and evaluation due to potential at risk complications.   Freeland Pracht DPM   

## 2022-09-14 DIAGNOSIS — N35011 Post-traumatic bulbous urethral stricture: Secondary | ICD-10-CM | POA: Diagnosis not present

## 2022-09-14 DIAGNOSIS — R311 Benign essential microscopic hematuria: Secondary | ICD-10-CM | POA: Diagnosis not present

## 2022-09-14 DIAGNOSIS — C61 Malignant neoplasm of prostate: Secondary | ICD-10-CM | POA: Diagnosis not present

## 2022-09-17 ENCOUNTER — Ambulatory Visit (INDEPENDENT_AMBULATORY_CARE_PROVIDER_SITE_OTHER): Payer: Medicare PPO

## 2022-09-17 DIAGNOSIS — I428 Other cardiomyopathies: Secondary | ICD-10-CM

## 2022-09-17 DIAGNOSIS — G4733 Obstructive sleep apnea (adult) (pediatric): Secondary | ICD-10-CM | POA: Diagnosis not present

## 2022-09-17 LAB — CUP PACEART REMOTE DEVICE CHECK
Battery Remaining Longevity: 28 mo
Battery Voltage: 2.94 V
Brady Statistic RV Percent Paced: 0.02 %
Date Time Interrogation Session: 20240509001804
HighPow Impedance: 65 Ohm
Implantable Lead Connection Status: 753985
Implantable Lead Implant Date: 20140530
Implantable Lead Location: 753860
Implantable Lead Model: 6935
Implantable Pulse Generator Implant Date: 20140530
Lead Channel Impedance Value: 361 Ohm
Lead Channel Impedance Value: 418 Ohm
Lead Channel Pacing Threshold Amplitude: 0.625 V
Lead Channel Pacing Threshold Pulse Width: 0.4 ms
Lead Channel Sensing Intrinsic Amplitude: 8 mV
Lead Channel Sensing Intrinsic Amplitude: 8 mV
Lead Channel Setting Pacing Amplitude: 2.5 V
Lead Channel Setting Pacing Pulse Width: 0.4 ms
Lead Channel Setting Sensing Sensitivity: 0.3 mV

## 2022-10-06 NOTE — Progress Notes (Signed)
Remote ICD transmission.   

## 2022-10-18 DIAGNOSIS — G4733 Obstructive sleep apnea (adult) (pediatric): Secondary | ICD-10-CM | POA: Diagnosis not present

## 2022-10-19 ENCOUNTER — Other Ambulatory Visit: Payer: Self-pay | Admitting: Internal Medicine

## 2022-10-21 DIAGNOSIS — R319 Hematuria, unspecified: Secondary | ICD-10-CM | POA: Diagnosis not present

## 2022-10-21 DIAGNOSIS — R311 Benign essential microscopic hematuria: Secondary | ICD-10-CM | POA: Diagnosis not present

## 2022-11-05 ENCOUNTER — Ambulatory Visit (INDEPENDENT_AMBULATORY_CARE_PROVIDER_SITE_OTHER): Payer: Medicare PPO

## 2022-11-05 DIAGNOSIS — G4733 Obstructive sleep apnea (adult) (pediatric): Secondary | ICD-10-CM | POA: Diagnosis not present

## 2022-11-05 DIAGNOSIS — Z0000 Encounter for general adult medical examination without abnormal findings: Secondary | ICD-10-CM | POA: Diagnosis not present

## 2022-11-05 NOTE — Patient Instructions (Signed)
Health Maintenance, Male Adopting a healthy lifestyle and getting preventive care are important in promoting health and wellness. Ask your health care provider about: The right schedule for you to have regular tests and exams. Things you can do on your own to prevent diseases and keep yourself healthy. What should I know about diet, weight, and exercise? Eat a healthy diet  Eat a diet that includes plenty of vegetables, fruits, low-fat dairy products, and lean protein. Do not eat a lot of foods that are high in solid fats, added sugars, or sodium. Maintain a healthy weight Body mass index (BMI) is a measurement that can be used to identify possible weight problems. It estimates body fat based on height and weight. Your health care provider can help determine your BMI and help you achieve or maintain a healthy weight. Get regular exercise Get regular exercise. This is one of the most important things you can do for your health. Most adults should: Exercise for at least 150 minutes each week. The exercise should increase your heart rate and make you sweat (moderate-intensity exercise). Do strengthening exercises at least twice a week. This is in addition to the moderate-intensity exercise. Spend less time sitting. Even light physical activity can be beneficial. Watch cholesterol and blood lipids Have your blood tested for lipids and cholesterol at 71 years of age, then have this test every 5 years. You may need to have your cholesterol levels checked more often if: Your lipid or cholesterol levels are high. You are older than 71 years of age. You are at high risk for heart disease. What should I know about cancer screening? Many types of cancers can be detected early and may often be prevented. Depending on your health history and family history, you may need to have cancer screening at various ages. This may include screening for: Colorectal cancer. Prostate cancer. Skin cancer. Lung  cancer. What should I know about heart disease, diabetes, and high blood pressure? Blood pressure and heart disease High blood pressure causes heart disease and increases the risk of stroke. This is more likely to develop in people who have high blood pressure readings or are overweight. Talk with your health care provider about your target blood pressure readings. Have your blood pressure checked: Every 3-5 years if you are 18-39 years of age. Every year if you are 40 years old or older. If you are between the ages of 65 and 75 and are a current or former smoker, ask your health care provider if you should have a one-time screening for abdominal aortic aneurysm (AAA). Diabetes Have regular diabetes screenings. This checks your fasting blood sugar level. Have the screening done: Once every three years after age 45 if you are at a normal weight and have a low risk for diabetes. More often and at a younger age if you are overweight or have a high risk for diabetes. What should I know about preventing infection? Hepatitis B If you have a higher risk for hepatitis B, you should be screened for this virus. Talk with your health care provider to find out if you are at risk for hepatitis B infection. Hepatitis C Blood testing is recommended for: Everyone born from 1945 through 1965. Anyone with known risk factors for hepatitis C. Sexually transmitted infections (STIs) You should be screened each year for STIs, including gonorrhea and chlamydia, if: You are sexually active and are younger than 71 years of age. You are older than 71 years of age and your   health care provider tells you that you are at risk for this type of infection. Your sexual activity has changed since you were last screened, and you are at increased risk for chlamydia or gonorrhea. Ask your health care provider if you are at risk. Ask your health care provider about whether you are at high risk for HIV. Your health care provider  may recommend a prescription medicine to help prevent HIV infection. If you choose to take medicine to prevent HIV, you should first get tested for HIV. You should then be tested every 3 months for as long as you are taking the medicine. Follow these instructions at home: Alcohol use Do not drink alcohol if your health care provider tells you not to drink. If you drink alcohol: Limit how much you have to 0-2 drinks a day. Know how much alcohol is in your drink. In the [place] one drink equals one 12 oz bottle of beer (355 mL), one 5 oz glass of wine (148 mL), or one 1 oz glass of hard liquor (44 mL). Lifestyle Do not use any products that contain nicotine or tobacco. These products include cigarettes, chewing tobacco, and vaping devices, such as e-cigarettes. If you need help quitting, ask your health care provider. Do not use street drugs. Do not share needles. Ask your health care provider for help if you need support or information about quitting drugs. General instructions Schedule regular health, dental, and eye exams. Stay current with your vaccines. Tell your health care provider if: You often feel depressed. You have ever been abused or do not feel safe at home. Summary Adopting a healthy lifestyle and getting preventive care are important in promoting health and wellness. Follow your health care provider's instructions about healthy diet, exercising, and getting tested or screened for diseases. Follow your health care provider's instructions on monitoring your cholesterol and blood pressure. This information is not intended to replace advice given to you by your health care provider. Make sure you discuss any questions you have with your health care provider. Document Revised: 09/16/2020 Document Reviewed: 09/16/2020 [employer] Patient Education  2024 [employer]  

## 2022-11-05 NOTE — Progress Notes (Signed)
Subjective:   [name] is a 72 y.o. male who presents for Medicare Annual/Subsequent preventive examination.  Visit Complete: Virtual  I connected with  [name] on 11/05/22 by a audio enabled telemedicine application and verified that I am speaking with the correct person using two identifiers.  Patient Location: Home  Provider Location: Home Office  I discussed the limitations of evaluation and management by telemedicine. The patient expressed understanding and agreed to proceed.  Patient Medicare AWV questionnaire was completed by the patient on 11/05/2022; I have confirmed that all information answered by patient is correct and no changes since this date.  Cardiac Risk Factors include: advanced age (>42men, >50 women);male gender;diabetes mellitus;hypertension     Objective:    Today's Vitals   There is no height or weight on file to calculate BMI.     11/05/2022   10:28 AM 08/14/2022    8:55 AM 05/14/2022    8:50 AM 02/11/2022   10:48 AM 01/05/2022    5:51 PM 12/16/2021    9:10 AM 11/21/2021   11:37 AM  Advanced Directives  Does Patient Have a Medical Advance Directive? Yes Yes Yes Yes No Yes Yes  Type of Estate agent of [place];Living will Healthcare Power of [employer] of [place];Living will   Healthcare Power of [place];Living will;Out of facility DNR (pink MOST or yellow form)  Does patient want to make changes to medical advance directive?  No - Patient declined       Copy of Healthcare Power of Attorney in Chart? No - copy requested No - copy requested     No - copy requested    Current Medications (verified) Outpatient Encounter Medications as of 11/05/2022  Medication Sig   ACCU-CHEK GUIDE test strip USE TO CHECK SUGAR 4 TIMES A DAY   acetaminophen (TYLENOL) 500 MG tablet Take 1,000 mg by mouth every 6 (six) hours as needed for mild pain.   atorvastatin (LIPITOR) 20 MG tablet Take by mouth.   carvedilol (COREG) 25  MG tablet TAKE 1 TABLET TWICE DAILY WITH MEALS   Cholecalciferol (VITAMIN D) 50 MCG (2000 UT) tablet Take 2,000 Units by mouth daily.   clopidogrel (PLAVIX) 75 MG tablet Take 1 tablet (75 mg total) by mouth daily.   cyanocobalamin (VITAMIN B12) 1000 MCG tablet TAKE 1 TABLET EVERY DAY   digoxin (LANOXIN) 0.125 MG tablet TAKE 1 TABLET EVERY DAY   docusate sodium (COLACE) 100 MG capsule Take 1 capsule (100 mg total) by mouth 2 (two) times daily as needed for mild constipation.   donepezil (ARICEPT) 10 MG tablet Take 1 tablet (10 mg total) by mouth daily.   famotidine (PEPCID) 20 MG tablet Take 1 tablet (20 mg total) by mouth daily.   JANUMET 50-1000 MG tablet TAKE 1 TABLET EVERY DAY WITH A MEAL   Lancets (ONETOUCH ULTRASOFT) lancets Use as instructed to test sugar daily and prn. Dx E11.9   losartan (COZAAR) 25 MG tablet TAKE 1 TABLET (25 MG TOTAL) BY MOUTH AT BEDTIME.   memantine (NAMENDA) 5 MG tablet Take 1 tablet (5 mg at night)   pantoprazole (PROTONIX) 40 MG tablet TAKE 1 TABLET EVERY DAY   sotalol (BETAPACE) 120 MG tablet TAKE 1 TABLET TWICE DAILY   No facility-administered encounter medications on file as of 11/05/2022.    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   Not compatible with MRI    Barrett's esophagus 09/05/2004   2 cm changes seen at index EGD   Cataract    Chronic back pain 04/25/2019   Chronic systolic heart failure    Colon polyps    tubular adenoma   Diabetic neuropathy 01/14/2022   Difficulty in walking 10/02/2021   Dizziness 01/19/2011   Dysrhythmia    Essential hypertension 08/26/2009   Fatigue 12/02/2020   Generalized anxiety disorder    since defibrillator placement   GERD (gastroesophageal reflux disease)    Hip pain 12/14/2016   History of COVID-19    History of  myocardial infarction    Hyperlipidemia    Left knee pain 12/14/2016   Low back pain 05/14/2021   Malignant neoplasm of prostate 09/27/2014   s/p prostatectomy   Mild anemia 01/20/2011   Mixed dementia 04/27/2022   Neuralgia 10/19/2018   NICM (nonischemic cardiomyopathy)    a.normal cors by cath in 2005. b. s/p Medtronic ICD.   OSA (obstructive sleep apnea) 11/10/2012   regular CPAP use   Pain due to onychomycosis of toenails of both feet 02/23/2022   Pain in the coccyx 01/28/2022   Rectal pain 12/21/2021   Right foot pain 12/15/2017   RLS (restless legs syndrome) 11/29/2012   S/P radiation therapy    10/10/2014 through 11/26/2014; Prostate bed 6600 cGy in 33 sessions   Shingles 07/28/2016   TIA (transient ischemic attack) 01/05/2022   Type II diabetes mellitus 08/26/2009   Ventricular tachycardia    a. s/p ICD (generator change 09/2012). b. 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 and placed on sotalol   Visual hallucinations    Vitamin B12 deficiency 03/06/2011   Monthly B12 shots initiated  2011. Nasally  inhaled B12 as of 07/2012   Past Surgical History:  Procedure Laterality Date   CARDIAC DEFIBRILLATOR PLACEMENT  03/11/2004   Medtronic Maximo single lead   COLONOSCOPY W/ BIOPSIES AND POLYPECTOMY  08/2004, 02/24/11   6mm adenoma in 2006,  5 mm polyp (not recovered) 2012   CYSTOSCOPY WITH DIRECT VISION INTERNAL URETHROTOMY N/A 10/08/2015   Procedure: CYSTOSCOPY WITH DIRECT VISION INTERNAL URETHROTOMY;  Surgeon: Hildred Laser, MD;  Location: [facility];  Service: Urology;  Laterality: N/A;   IMPLANTABLE CARDIOVERTER DEFIBRILLATOR GENERATOR CHANGE N/A 10/07/2012   Procedure: IMPLANTABLE CARDIOVERTER DEFIBRILLATOR GENERATOR CHANGE;  Surgeon: Marinus Maw, MD;  Location: [facility] CATH LAB;  Service: Cardiovascular;  Laterality: N/A;   PROSTATECTOMY  06/2003   Dr Brunilda Payor   RECTAL SURGERY     UPPER GASTROINTESTINAL ENDOSCOPY  08/2004, 02/24/11   Barrett's esophagus   Family History  Problem Relation Age of  Onset   Dementia Mother    Hypertension Mother    Coronary artery disease Father    Prostate cancer Father    Diabetes Father    Dementia Sister    Dementia Brother    Diabetes Paternal Grandmother    Dementia Maternal Aunt    Stroke Neg Hx    Colon cancer Neg Hx    Colon polyps Neg Hx    Esophageal cancer Neg Hx    Rectal cancer Neg Hx    Stomach cancer Neg Hx    Social History   Socioeconomic History   Marital status: Married    Spouse name: Not on file   Number of children: 2   Years of education: 16   Highest education level: Bachelor's degree (e.g., BA, AB, BS)  Occupational History   Occupation: Retired    Comment: Secretary/administrator  Tobacco Use   Smoking status: Never   Smokeless tobacco: Never  Vaping Use   Vaping Use: Never used  Substance and Sexual Activity   Alcohol use: No   Drug use: No   Sexual activity: Not on file  Other Topics Concern   Not on file  Social History Narrative   Right handed   No caffeine   Two story home   Retired   Lives with wife   Social Determinants of Health   Financial Resource Strain: Low Risk  (11/05/2022)   Overall Financial Resource Strain (CARDIA)    Difficulty of Paying Living Expenses: Not hard at all  Food Insecurity: No Food Insecurity (11/05/2022)   Hunger Vital Sign    Worried About Running Out of Food in the Last Year: Never true    Ran Out of Food in the Last Year: Never true  Transportation Needs: No Transportation Needs (11/05/2022)   PRAPARE - Administrator, Civil Service (Medical): No    Lack of Transportation (Non-Medical): No  Physical Activity: Sufficiently Active (11/05/2022)   Exercise Vital Sign    Days of Exercise per Week: 3 days    Minutes of Exercise per Session: 60 min  Stress: No Stress Concern Present (11/21/2021)   [employer] of Occupational Health - Occupational Stress Questionnaire    Feeling of Stress : Not at all  Social Connections: Socially Integrated  (11/05/2022)   Social Connection and Isolation Panel [NHANES]    Frequency of Communication with Friends and Family: More than three times a week    Frequency of Social Gatherings with Friends and Family: More than three times a week    Attends Religious Services: More than 4 times per year    Active Member of [employer] or Organizations: Yes    Attends Engineer, structural: More than 4 times per year    Marital Status: Married    Tobacco Counseling Counseling given: Not Answered   Clinical Intake:  Pre-visit preparation completed: Yes  Pain : No/denies pain     Diabetes: Yes CBG done?: No Did pt. bring in CBG monitor from home?: No  How often do you need to have someone help you when you read instructions, pamphlets, or other written materials from your doctor or pharmacy?: 1 - Never What is the last grade level you completed in school?: bachelors degree  Interpreter Needed?: No      Activities of Daily Living    11/05/2022   10:23 AM 01/06/2022    1:00 AM  In your present state of health, do you have any difficulty performing the following activities:  Hearing? 0 0  Vision? 0 0  Difficulty concentrating or making decisions? 0 0  Walking or climbing stairs? 0 0  Dressing or bathing? 0 0  Doing errands, shopping? 0 0  Preparing Food and eating ? N   Using the Toilet? N   In the past six months, have you accidently leaked urine? N   Do you have problems with loss of bowel control? N   Managing your Medications? N   Managing your Finances? N   Housekeeping or managing your Housekeeping? N     Patient Care Team: Pincus Sanes, MD as PCP - General (Internal Medicine) Marinus Maw, MD as PCP - Electrophysiology (Cardiology) Crist Fat, MD as Attending Physician (Urology) Iva Boop, MD as Consulting  Physician (Gastroenterology) Erlene Quan, Vinnie Level, [facility] (Inactive) as Pharmacist (Pharmacist) Marinus Maw, MD as Consulting Physician  (Cardiology) Elwyn Reach (Neurology)  Indicate any recent Medical Services you may have received from other than [facility] providers in the past year (date may be approximate).     Assessment:   This is a routine wellness examination for Lane.  Hearing/Vision screen No results found.  Dietary issues and exercise activities discussed:     Goals Addressed             This Visit's Progress    walk normally after stroke        Depression Screen    11/05/2022   10:29 AM 11/05/2022   10:27 AM 07/14/2022   10:24 AM 11/21/2021   11:44 AM 11/14/2020    9:41 AM 10/18/2019    8:25 AM 08/24/2019   11:17 AM  PHQ 2/9 Scores  PHQ - 2 Score 0 0 0 0 0 0 0  PHQ- 9 Score 0 0 2        Fall Risk    11/05/2022   10:28 AM 08/14/2022    8:55 AM 07/14/2022   10:23 AM 05/14/2022    8:49 AM 02/11/2022   10:48 AM  Fall Risk   Falls in the past year? 0 0 0 0 0  Number falls in past yr: 0 0 0 0 0  Injury with Fall? 0 0 0 0 0  Risk for fall due to : No Fall Risks  No Fall Risks    Follow up Falls prevention discussed Falls evaluation completed Falls evaluation completed Falls evaluation completed     MEDICARE RISK AT HOME:  Medicare Risk at Home - 11/05/22 1029     Any stairs in or around the home? Yes    If so, are there any without handrails? No    Home free of loose throw rugs in walkways, pet beds, electrical cords, etc? Yes    Adequate lighting in your home to reduce risk of falls? Yes    Life alert? No    Use of a cane, walker or w/c? No    Grab bars in the bathroom? No    Shower chair or bench in shower? No    Elevated toilet seat or a handicapped toilet? No             TIMED UP AND GO:  Was the test performed?  No    Cognitive Function:    11/10/2021    6:00 PM  MMSE - Mini Mental State Exam  Orientation to time 5  Orientation to Place 5  Registration 3  Attention/ Calculation 5  Recall 3  Language- name 2 objects 2  Language- repeat 1  Language- follow 3 step  command 3  Language- read & follow direction 1  Write a sentence 1  Copy design 1  Total score 30      12/05/2020   10:00 AM  Montreal Cognitive Assessment   Visuospatial/ Executive (0/5) 3  Naming (0/3) 2  Attention: Read list of digits (0/2) 2  Attention: Read list of letters (0/1) 1  Attention: Serial 7 subtraction starting at 100 (0/3) 1  Language: Repeat phrase (0/2) 0  Language : Fluency (0/1) 0  Abstraction (0/2) 0  Delayed Recall (0/5) 1  Orientation (0/6) 6  Total 16      11/05/2022   10:30 AM 11/21/2021   11:47 AM 10/18/2019    8:26 AM  6CIT  Screen  What Year? 0 points 0 points 0 points  What month? 0 points 0 points 0 points  What time? 0 points 0 points 0 points  Count back from 20 2 points 0 points 0 points  Months in reverse 4 points 0 points 0 points  Repeat phrase 10 points 0 points 0 points  Total Score 16 points 0 points 0 points    Immunizations Immunization History  Administered Date(s) Administered   COVID-19, mRNA, vaccine(Comirnaty)12 years and older 02/24/2022   Fluad Quad(high Dose 65+) 02/06/2019, 01/15/2021, 02/24/2022   Influenza Split 03/10/2012   Influenza, High Dose Seasonal PF 06/16/2016, 06/17/2017   Influenza, Seasonal, Injecte, Preservative Fre 03/10/2014   Influenza,inj,Quad PF,6+ Mos 03/02/2013   Influenza,trivalent, recombinat, inj, PF 01/09/2018   Moderna SARS-COV2 Booster Vaccination 02/24/2022   PFIZER Comirnaty(Gray Top)Covid-19 Tri-Sucrose Vaccine 09/30/2020   PFIZER(Purple Top)SARS-COV-2 Vaccination 05/24/2019, 06/13/2019, 01/23/2020   Pfizer Covid-19 Vaccine Bivalent Booster 52yrs & up 02/04/2021   Pneumococcal Conjugate-13 12/14/2016   Pneumococcal Polysaccharide-23 12/15/2017   Respiratory Syncytial Virus Vaccine,Recomb Aduvanted(Arexvy) 05/08/2022   Tdap 11/29/2012   Zoster Recombinat (Shingrix) 01/09/2018, 11/21/2021    TDAP status: Up to date  Flu Vaccine status: Up to date  Pneumococcal vaccine status: Up to  date  Covid-19 vaccine status: Completed vaccines  Qualifies for Shingles Vaccine? Yes   Zostavax completed Yes   Shingrix Completed?: Yes  Screening Tests Health Maintenance  Topic Date Due   OPHTHALMOLOGY EXAM  04/09/2021   COVID-19 Vaccine (6 - 2023-24 season) 04/21/2022   Diabetic kidney evaluation - Urine ACR  08/05/2022   DTaP/Tdap/Td (2 - Td or Tdap) 11/30/2022   INFLUENZA VACCINE  12/10/2022   HEMOGLOBIN A1C  01/14/2023   FOOT EXAM  06/11/2023   Diabetic kidney evaluation - eGFR measurement  07/14/2023   Medicare Annual Wellness (AWV)  11/05/2023   Colonoscopy  11/07/2026   Pneumonia Vaccine 65+ Years old  Completed   Hepatitis C Screening  Completed   Zoster Vaccines- Shingrix  Completed   HPV VACCINES  Aged Out    Health Maintenance  Health Maintenance Due  Topic Date Due   OPHTHALMOLOGY EXAM  04/09/2021   COVID-19 Vaccine (6 - 2023-24 season) 04/21/2022   Diabetic kidney evaluation - Urine ACR  08/05/2022    Colorectal cancer screening: Type of screening: Colonoscopy. Completed 11/07/2019. Repeat every 7 years  Lung Cancer Screening: (Low Dose CT Chest recommended if Age 30-80 years, 20 pack-year currently smoking OR have quit w/in 15years.) does not qualify.   Lung Cancer Screening Referral: na  Additional Screening:  Hepatitis C Screening: does qualify; Completed 08/05/2015  Vision Screening: Recommended annual ophthalmology exams for early detection of glaucoma and other disorders of the eye. Is the patient up to date with their annual eye exam?  Yes  Who is the provider or what is the name of the office in which the patient attends annual eye exams? [facility]  If pt is not established with a provider, would they like to be referred to a provider to establish care?  Establish  .   Dental Screening: Recommended annual dental exams for proper oral hygiene  Diabetic Foot Exam: Diabetic Foot Exam: Completed 06/10/2022  Community Resource Referral / Chronic  Care Management: CRR required this visit?  No   CCM required this visit?  No     Plan:     I have personally reviewed and noted the following in the patient's chart:   Medical and social history Use of  alcohol, tobacco or illicit drugs  Current medications and supplements including opioid prescriptions. Patient is not currently taking opioid prescriptions. Functional ability and status Nutritional status Physical activity Advanced directives List of other physicians Hospitalizations, surgeries, and ER visits in previous 12 months Vitals Screenings to include cognitive, depression, and falls Referrals and appointments  In addition, I have reviewed and discussed with patient certain preventive protocols, quality metrics, and best practice recommendations. A written personalized care plan for preventive services as well as general preventive health recommendations were provided to patient.     Delana Meyer   11/05/2022   After Visit Summary: (MyChart) Due to this being a telephonic visit, the after visit summary with patients personalized plan was offered to patient via MyChart   Nurse Notes:

## 2022-11-07 ENCOUNTER — Other Ambulatory Visit: Payer: Self-pay | Admitting: Physician Assistant

## 2022-11-13 ENCOUNTER — Encounter: Payer: Self-pay | Admitting: Physician Assistant

## 2022-11-13 ENCOUNTER — Ambulatory Visit: Payer: Medicare PPO | Admitting: Physician Assistant

## 2022-11-13 VITALS — BP 139/78 | HR 82 | Resp 20 | Ht 71.0 in | Wt 189.0 lb

## 2022-11-13 DIAGNOSIS — F0150 Vascular dementia without behavioral disturbance: Secondary | ICD-10-CM | POA: Diagnosis not present

## 2022-11-13 DIAGNOSIS — E1142 Type 2 diabetes mellitus with diabetic polyneuropathy: Secondary | ICD-10-CM | POA: Diagnosis not present

## 2022-11-13 DIAGNOSIS — G309 Alzheimer's disease, unspecified: Secondary | ICD-10-CM

## 2022-11-13 DIAGNOSIS — F0280 Dementia in other diseases classified elsewhere without behavioral disturbance: Secondary | ICD-10-CM

## 2022-11-13 MED ORDER — DONEPEZIL HCL 23 MG PO TABS: 23.0000 mg | ORAL_TABLET | Freq: Every day | ORAL | 11 refills | Status: DC

## 2022-11-13 NOTE — Progress Notes (Signed)
Assessment/Plan:   Memory Impairment  [name] is a very pleasant 72 y.o. RH malewith a history of with a history of hypertension, ventricular tachycardia s/p MI , cardiomyopathy, chronic heart failure, aortic atherosclerosis, hyperlipidemia, Type II diabetes with polyneuropathy, and obstructive sleep apnea, B12 deficiency and a diagnosis of mixed dementia of unclear etiology although there are concerns for neurodegenerative disease as per neuropsych evaluation on December 2023 seen today in follow up for memory loss.  Patient is on donepezil 10 mg daily.  He was unable to tolerate memantine due to diarrhea (he has held the medication for 1 week, retrying it, and again he had the same symptoms for which it was discontinued altogether).  He has less hallucinations since using the CPAP on a regular basis.  Today's MMSE is 21/30, showing decline especially with dates and orientation.  He still able to do all his ADLs, and continues to drive carefully, denies getting lost.   Follow up in 6 months. Continue donepezil, increase to 23 mg daily, side effects discussed (in view of intolerance to memantine) Patient is scheduled for repeat neuropsychological evaluation on 05/2023 for clarity of the diagnosis and disease trajectory Continue CPAP use for OSA Recommend good control of her cardiovascular risk factors Continue to control mood as per PCP  Mild chronic radiculopathy  Close evaluated by Dr. 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  He uses lidocaine as needed.  He had PT with some improvement of his symptoms. Continue lidocaine cream as needed for neuropathy Continue PT He is to follow-up with Dr. Loleta Chance  as needed.   Subjective:    This patient is accompanied in the office by his wife who supplements the history.  Previous records as well as any outside records available were reviewed prior to todays visit. Patient was last seen on 05/14/22.   Any changes in memory since last visit? " I know it is worse".  He has some difficulty remembering recent conversations and people's names. repeats oneself?  Endorsed  Disoriented when walking into a room?  Patient denies    Leaving objects in unusual places?  May misplace things but not in unusual places    Wandering behavior?  denies   Any personality changes since last visit?  denies   Any worsening depression?:  denies   Hallucinations or paranoia?  As before, he sometimes sees people that he does not know in his room, but when walking by then they disappear, although not as frequent as before.  These are not scary to him.  It is only at night, during the day he does not see them.  No auditory hallucinations.  No paranoia. Seizures? denies    Any sleep changes?  Sometimes he reports vivid dreams, not reenacting them as reported before. denies sleepwalking   Sleep apnea?  Endorsed, uses CPAP Any hygiene concerns? denies  Independent of bathing and dressing?  Endorsed  Does the patient needs help with medications?  Patient and wife prepare the pillbox together Who is in charge of the finances?  Wife is in charge     Any changes in appetite?  denies     Patient have trouble swallowing? denies   Does the patient cook? No Any headaches?   denies   Chronic back  pain  denies   Ambulates with difficulty?  He has ongoing imbalance due to polyneuropathy, without falls.   Recent falls or head injuries? denies     Unilateral weakness, numbness or tingling?  He has neuropathy, which is somewhat improved after physical therapy and the use of lidocaine as needed.   No unilateral weakness. Any tremors?  denies   Any anosmia?  Patient denies   Any  incontinence of urine?  Endorsed.  Wears pads since his prostate surgery for prostate cancer.  Any bowel dysfunction?     denies      Patient lives with wife Does the patient drive?  He continues to drive, denies getting lost   Neuropsychological evaluation 04/27/22, Dr. Milbert Coulter :  Briefly, results suggested fairly diffuse cognitive dysfunction, with ongoing impairment surrounding domains of processing speed, complex attention, executive functioning, receptive language, and essentially all aspects of learning and memory. Visuospatial abilities exhibited some performance variability but were also largely below expectation. Relative to his previous evaluation in December 2022, cognitive decline was exhibited across receptive language, clock drawing, essentially all aspects of visual memory, and delayed retrieval aspects of verbal memory. Outside of this, the majority of cognitive domains exhibited relative stability. However, it is important to highlight that this generally suggests stable impairment rather than intact performances. Regarding concerns for Lewy body disease, testing patterns do suggest impairment surrounding executive functioning and visuospatial abilities. However, it is difficult to determine how much of this is impacted by vascular contributions or another illness such as early stages of Alzheimer's disease. He does exhibit fully formed hallucinations which is certainly a common component of this illness. There have also been concerns surrounding infrequent REM sleep behaviors in the past.      Initial evaluation 12/05/2020 the patient is seen in neurologic consultation at the request of Corwin Levins, MD for the evaluation of memory.  The patient is accompanied by wife [name] who supplements the history. He is a 72 y.o. year old male who has had memory issues for about 6 months "it is not like it used to be, takes a little longer to get there"  His wife reports that he has been asking the same  questions more frequently "When are we going to visit xyz? Or When are we going to [facility]" etc. He does not repeat the same stories though. He works as a Cytogeneticist at night, and denies anyone at work noticing any changes. His wife also says that his attention has changed "it was always an issue, but he pays less attention than before".  He denies irritability.  No paranoia or hallucinations. He denies being depressed, but he does "feel tired all the time". His appetite is decreased and has lost 30 lbs since April, this is being followed by PCP. "I come back from work sometimes at midnight and I don't feel like eating". He denies taking any Ensure or similar food supplements.  He admits to not doing any activities that stimulate him, or any other physical activities outside of work.  "Since COVID in April of this year, and feels still very tired ".  He sleeps well, and denies sleepwalking or vivid dreams.  He is independent of dressing and bathing.  He takes his medications and does not forget to take any doses.  He manages his own finances without missing any payments.  Denies leaving objects in unusual places.  The patient does not cook.  He denies leaving the faucet on when taking a  shower.  He ambulates without difficulty without the use of a walker or a cane.  At times, he is so tired that "stumbles ".  The patient drives independently, without getting lost.  He does not use a GPS.  He denies any headaches, falls, injuries to the head, double vision, dizziness, focal numbness or tingling, unilateral weakness or tremors.  He has chronic incontinence, denies retention.  He denies any constipation or diarrhea.  Denies anosmia.  Denies a history of alcohol, or tobacco.  Family history is significant for mother, sister, and maternal aunt all had dementia.  Brother is alive, but he lives in an ALF with dementia.  He was born in [place], he has college education, graduated from [employer] in Social Studies   Pertinent  labs 11/14/2020 A1c 6.4 B12 938 TSH 1.89 Lipid panel normal Sed rate normal at 18, proBNP 19 normal, vitamin D34.68, normal, CMP unremarkable, hepatic function panel with alkaline phosphatase 126, stable from prior.  PSA 0, urinalysis stable without changes from prior, CBC remarkable for hemoglobin of 11, low MCV 71.8, hematocrit 34.4, normal platelets. CT scan of the chest abdomen and pelvis are pending. QT/QTC 362/420 PREVIOUS MEDICATIONS:   CURRENT MEDICATIONS:  Outpatient Encounter Medications as of 11/13/2022  Medication Sig   acetaminophen (TYLENOL) 500 MG tablet Take 1,000 mg by mouth every 6 (six) hours as needed for mild pain.   atorvastatin (LIPITOR) 20 MG tablet Take by mouth.   carvedilol (COREG) 25 MG tablet TAKE 1 TABLET TWICE DAILY WITH MEALS   Cholecalciferol (VITAMIN D) 50 MCG (2000 UT) tablet Take 2,000 Units by mouth daily.   clopidogrel (PLAVIX) 75 MG tablet Take 1 tablet (75 mg total) by mouth daily.   cyanocobalamin (VITAMIN B12) 1000 MCG tablet TAKE 1 TABLET EVERY DAY   digoxin (LANOXIN) 0.125 MG tablet TAKE 1 TABLET EVERY DAY   docusate sodium (COLACE) 100 MG capsule Take 1 capsule (100 mg total) by mouth 2 (two) times daily as needed for mild constipation.   famotidine (PEPCID) 20 MG tablet Take 1 tablet (20 mg total) by mouth daily.   JANUMET 50-1000 MG tablet TAKE 1 TABLET EVERY DAY WITH A MEAL   Lancets (ONETOUCH ULTRASOFT) lancets Use as instructed to test sugar daily and prn. Dx E11.9   losartan (COZAAR) 25 MG tablet TAKE 1 TABLET (25 MG TOTAL) BY MOUTH AT BEDTIME.   pantoprazole (PROTONIX) 40 MG tablet TAKE 1 TABLET EVERY DAY   sotalol (BETAPACE) 120 MG tablet TAKE 1 TABLET TWICE DAILY   [DISCONTINUED] donepezil (ARICEPT) 10 MG tablet TAKE 1 TABLET EVERY DAY   ACCU-CHEK GUIDE test strip USE TO CHECK SUGAR 4 TIMES A DAY   donepezil (ARICEPT) 23 MG TABS tablet Take 1 tablet (23 mg total) by mouth daily.   [DISCONTINUED] memantine (NAMENDA) 5 MG tablet Take 1  tablet (5 mg at night) (Patient not taking: Reported on 11/13/2022)   No facility-administered encounter medications on file as of 11/13/2022.       11/13/2022    9:00 AM 11/10/2021    6:00 PM  MMSE - Mini Mental State Exam  Orientation to time 2 5  Orientation to Place 3 5  Registration 3 3  Attention/ Calculation 2 5  Recall 3 3  Language- name 2 objects 2 2  Language- repeat 1 1  Language- follow 3 step command 3 3  Language- read & follow direction 1 1  Write a sentence 1 1  Copy design 0 1  Total score  21 30      12/05/2020   10:00 AM  Montreal Cognitive Assessment   Visuospatial/ Executive (0/5) 3  Naming (0/3) 2  Attention: Read list of digits (0/2) 2  Attention: Read list of letters (0/1) 1  Attention: Serial 7 subtraction starting at 100 (0/3) 1  Language: Repeat phrase (0/2) 0  Language : Fluency (0/1) 0  Abstraction (0/2) 0  Delayed Recall (0/5) 1  Orientation (0/6) 6  Total 16    Objective:     PHYSICAL EXAMINATION:    VITALS:   Vitals:   11/13/22 0859  BP: 139/78  Pulse: 82  Resp: 20  SpO2: 98%  Weight: 189 lb (85.7 kg)  Height: 5\' 11"  (1.803 m)    GEN:  The patient appears stated age and is in NAD. HEENT:  Normocephalic, atraumatic.   Neurological examination:  General: NAD, well-groomed, appears stated age. Orientation: The patient is alert. Oriented to person, Not to place or date Cranial nerves: There is good facial symmetry.The speech is fluent and clear. No aphasia or dysarthria. Fund of knowledge is appropriate. Recent and remote memory are impaired. Attention and concentration are reduced.  Able to name objects and repeat phrases.  Hearing is intact to conversational tone.   Sensation: Sensation is intact to light touch throughout Motor: Strength is at least antigravity x4. DTR's 2/4 in UE/LE     Movement examination: Tone: There is normal tone in the UE/LE Abnormal movements:  no tremor.  No myoclonus.  No asterixis.   Coordination:   There is no decremation with RAM's. Normal finger to nose  Gait and Station: The patient has no difficulty arising out of a deep-seated chair without the use of the hands. The patient's stride length is good.  Gait is cautious and narrow.    Thank you for allowing [place] the opportunity to participate in the care of this nice patient. Please do not hesitate to contact us for any questions or concerns.   Total time spent on today's visit was 30 minutes dedicated to this patient today, preparing to see patient, examining the patient, ordering tests and/or medications and counseling the patient, documenting clinical information in the EHR or other health record, independently interpreting results and communicating results to the patient/family, discussing treatment and goals, answering patient's questions and coordinating care.  Cc:  Pincus Sanes, MD  Marlowe Kays 11/13/2022 9:42 AM

## 2022-11-13 NOTE — Patient Instructions (Signed)
It was a pleasure to see you today at our office.   Recommendations:  Follow up in  6  months after the neurocognitive testing Continue donepezil increase  to 23 mg daily     Whom to call:  Memory  decline, memory medications: Call our office [phone]   For psychiatric meds, mood meds: Please have your primary care physician manage these medications.       For assessment of decision of mental capacity and competency:  Call Dr. Erick Blinks, geriatric psychiatrist at [phone]  For guidance in geriatric dementia issues please call [facility] [phone]     If you have any severe symptoms of a stroke, or other severe issues such as confusion,severe chills or fever, etc call 911 or go to the ER as you may need to be evaluated further        RECOMMENDATIONS FOR ALL PATIENTS WITH MEMORY PROBLEMS: 1. Continue to exercise (Recommend 30 minutes of walking everyday, or 3 hours every week) 2. Increase social interactions - continue going to [place] and enjoy social gatherings with friends and family 3. Eat healthy, avoid fried foods and eat more fruits and vegetables 4. Maintain adequate blood pressure, blood sugar, and blood cholesterol level. Reducing the risk of stroke and cardiovascular disease also helps promoting better memory. 5. Avoid stressful situations. Live a simple life and avoid aggravations. Organize your time and prepare for the next day in anticipation. 6. Sleep well, avoid any interruptions of sleep and avoid any distractions in the bedroom that may interfere with adequate sleep quality 7. Avoid sugar, avoid sweets as there is a strong link between excessive sugar intake, diabetes, and cognitive impairment We discussed the Mediterranean diet, which has been shown to help patients reduce the risk of progressive memory disorders and reduces cardiovascular risk. This includes eating fish, eat fruits and green leafy vegetables, nuts like almonds and  hazelnuts, walnuts, and also use olive oil. Avoid fast foods and fried foods as much as possible. Avoid sweets and sugar as sugar use has been linked to worsening of memory function.  There is always a concern of gradual progression of memory problems. If this is the case, then we may need to adjust level of care according to patient needs. Support, both to the patient and caregiver, should then be put into place.    FALL PRECAUTIONS: Be cautious when walking. Scan the area for obstacles that may increase the risk of trips and falls. When getting up in the mornings, sit up at the edge of the bed for a few minutes before getting out of bed. Consider elevating the bed at the head end to avoid drop of blood pressure when getting up. Walk always in a well-lit room (use night lights in the walls). Avoid area rugs or power cords from appliances in the middle of the walkways. Use a walker or a cane if necessary and consider physical therapy for balance exercise. Get your eyesight checked regularly.  FINANCIAL OVERSIGHT: Supervision, especially oversight when making financial decisions or transactions is also recommended.  HOME SAFETY: Consider the safety of the kitchen when operating appliances like stoves, microwave oven, and blender. Consider having supervision and share cooking responsibilities until no longer able to participate in those. Accidents with firearms and other hazards in the house should be identified and addressed as well.   ABILITY TO BE LEFT ALONE: If patient is unable to contact 911 operator, consider using [facility], or when the need is there, arrange  for someone to stay with patients. Smoking is a fire hazard, consider supervision or cessation. Risk of wandering should be assessed by caregiver and if detected at any point, supervision and safe proof recommendations should be instituted.  MEDICATION SUPERVISION: Inability to self-administer medication needs to be constantly addressed.  Implement a mechanism to ensure safe administration of the medications.   DRIVING: Regarding driving, in patients with progressive memory problems, driving will be impaired. We advise to have someone else do the driving if trouble finding directions or if minor accidents are reported. Independent driving assessment is available to determine safety of driving.   If you are interested in the driving assessment, you can contact the following:  [employer] in [place] [phone]  [facility] [phone]  [facility] [phone]  [facility] [phone] or [phone]

## 2022-11-14 ENCOUNTER — Other Ambulatory Visit: Payer: Self-pay | Admitting: Internal Medicine

## 2022-11-17 ENCOUNTER — Other Ambulatory Visit: Payer: Self-pay

## 2022-12-10 ENCOUNTER — Encounter: Payer: Self-pay | Admitting: Podiatry

## 2022-12-10 ENCOUNTER — Ambulatory Visit: Payer: Medicare PPO | Admitting: Podiatry

## 2022-12-10 DIAGNOSIS — M79675 Pain in left toe(s): Secondary | ICD-10-CM

## 2022-12-10 DIAGNOSIS — E0842 Diabetes mellitus due to underlying condition with diabetic polyneuropathy: Secondary | ICD-10-CM | POA: Diagnosis not present

## 2022-12-10 DIAGNOSIS — M79674 Pain in right toe(s): Secondary | ICD-10-CM | POA: Diagnosis not present

## 2022-12-10 DIAGNOSIS — B351 Tinea unguium: Secondary | ICD-10-CM | POA: Diagnosis not present

## 2022-12-10 NOTE — Progress Notes (Signed)
This patient returns to my office for at risk foot care.  This patient requires this care by a professional since this patient will be at risk due to having  diabetes.  This patient is unable to cut nails himself since the patient cannot reach his nails.These nails are painful walking and wearing shoes.  This patient presents for at risk foot care today.  General Appearance  Alert, conversant and in no acute stress.  Vascular  Dorsalis pedis and posterior tibial  pulses are palpable  bilaterally.  Capillary return is within normal limits  bilaterally. Temperature is within normal limits  bilaterally.  Neurologic  Senn-Weinstein monofilament wire test within normal limits  bilaterally. Muscle power within normal limits bilaterally.  Nails Thick disfigured discolored nails with subungual debris  from hallux to fifth toes bilaterally. No evidence of bacterial infection or drainage bilaterally.  Orthopedic  No limitations of motion  feet .  No crepitus or effusions noted.  No bony pathology or digital deformities noted.  Skin  normotropic skin with no porokeratosis noted bilaterally.  No signs of infections or ulcers noted.     Onychomycosis  Pain in right toes  Pain in left toes  Consent was obtained for treatment procedures.   Mechanical debridement of nails 1-5  bilaterally performed with a nail nipper.  Filed with dremel without incident.    Return office visit   3 months                   Told patient to return for periodic foot care and evaluation due to potential at risk complications.   Gregory Mayer DPM   

## 2022-12-17 ENCOUNTER — Ambulatory Visit (INDEPENDENT_AMBULATORY_CARE_PROVIDER_SITE_OTHER): Payer: Medicare PPO

## 2022-12-17 DIAGNOSIS — I472 Ventricular tachycardia, unspecified: Secondary | ICD-10-CM

## 2022-12-17 DIAGNOSIS — I428 Other cardiomyopathies: Secondary | ICD-10-CM

## 2022-12-24 ENCOUNTER — Encounter (INDEPENDENT_AMBULATORY_CARE_PROVIDER_SITE_OTHER): Payer: Self-pay

## 2023-01-05 ENCOUNTER — Encounter: Payer: Self-pay | Admitting: Physician Assistant

## 2023-01-13 ENCOUNTER — Encounter: Payer: Self-pay | Admitting: Internal Medicine

## 2023-01-13 NOTE — Progress Notes (Signed)
Subjective:    Patient ID: [name], male    DOB: 17-Mar-1951, 72 y.o.   MRN: [id number]     HPI [name] is here for a physical exam and his chronic medical problems.   Back pain - in may went to sit on a bench and missed it and has had back pain - b/l pain.  No radiating pain.    Toes itch all night long - do not itch during day.   No N/T.  They are cold.    Medications and allergies reviewed with patient and updated if appropriate.  Current Outpatient Medications on File Prior to Visit  Medication Sig Dispense Refill   ACCU-CHEK GUIDE test strip USE TO CHECK SUGAR 4 TIMES A DAY 100 strip 4   acetaminophen (TYLENOL) 500 MG tablet Take 1,000 mg by mouth every 6 (six) hours as needed for mild pain.     atorvastatin (LIPITOR) 20 MG tablet Take by mouth.     carvedilol (COREG) 25 MG tablet TAKE 1 TABLET TWICE DAILY WITH MEALS 180 tablet 2   Cholecalciferol (VITAMIN D) 50 MCG (2000 UT) tablet Take 2,000 Units by mouth daily.     clopidogrel (PLAVIX) 75 MG tablet TAKE 1 TABLET EVERY DAY 90 tablet 3   cyanocobalamin (VITAMIN B12) 1000 MCG tablet TAKE 1 TABLET EVERY DAY 90 tablet 3   digoxin (LANOXIN) 0.125 MG tablet TAKE 1 TABLET EVERY DAY 90 tablet 3   docusate sodium (COLACE) 100 MG capsule Take 1 capsule (100 mg total) by mouth 2 (two) times daily as needed for mild constipation. 60 capsule 2   donepezil (ARICEPT) 23 MG TABS tablet Take 1 tablet (23 mg total) by mouth daily. 30 tablet 11   famotidine (PEPCID) 20 MG tablet Take 1 tablet (20 mg total) by mouth daily. 90 tablet 3   JANUMET 50-1000 MG tablet TAKE 1 TABLET EVERY DAY WITH A MEAL 90 tablet 10   Lancets (ONETOUCH ULTRASOFT) lancets Use as instructed to test sugar daily and prn. Dx E11.9 100 each 12   losartan (COZAAR) 25 MG tablet TAKE 1 TABLET (25 MG TOTAL) BY MOUTH AT BEDTIME. 90 tablet 3   pantoprazole (PROTONIX) 40 MG tablet TAKE 1 TABLET EVERY DAY 90 tablet 1   sotalol (BETAPACE) 120 MG tablet TAKE 1 TABLET TWICE  DAILY 180 tablet 3   No current facility-administered medications on file prior to visit.    Review of Systems  Constitutional:  Negative for appetite change and fever.  Eyes:  Negative for visual disturbance.  Respiratory:  Negative for cough, shortness of breath and wheezing.   Cardiovascular:  Negative for chest pain, palpitations and leg swelling.  Gastrointestinal:  Negative for abdominal pain, blood in stool, constipation and diarrhea.       Occ gerd - controlled  Genitourinary:  Negative for dysuria and hematuria.  Musculoskeletal:  Positive for back pain. Negative for arthralgias.  Skin:  Negative for rash.  Neurological:  Negative for light-headedness and headaches.  Psychiatric/Behavioral:  Negative for dysphoric mood. The patient is not nervous/anxious.        Objective:   Vitals:   01/14/23 1000  BP: 120/78  Pulse: 76  Temp: 98.4 F (36.9 C)  SpO2: 97%   Filed Weights   01/14/23 1000  Weight: 178 lb (80.7 kg)   Body mass index is 24.83 kg/m.  BP Readings from Last 3 Encounters:  01/14/23 120/78  11/13/22 139/78  08/14/22 (!) 155/82  Wt Readings from Last 3 Encounters:  01/14/23 178 lb (80.7 kg)  11/13/22 189 lb (85.7 kg)  08/14/22 190 lb (86.2 kg)      Physical Exam Constitutional: He appears well-developed and well-nourished. No distress.  HENT:  Head: Normocephalic and atraumatic.  Right Ear: External ear normal.  Left Ear: External ear normal.  Normal ear canals and TM b/l  Mouth/Throat: Oropharynx is clear and moist. Eyes: Conjunctivae and EOM are normal.  Neck: Neck supple. No tracheal deviation present. No thyromegaly present.  No carotid bruit  Cardiovascular: Normal rate, regular rhythm, normal heart sounds and intact distal pulses.   No murmur heard.  No lower extremity edema. Pulmonary/Chest: Effort normal and breath sounds normal. No respiratory distress. He has no wheezes. He has no rales.  Abdominal: Soft. He exhibits no  distension. There is no tenderness.  Genitourinary: deferred  Lymphadenopathy:   He has no cervical adenopathy.  Skin: Skin is warm and dry. He is not diaphoretic.  Psychiatric: He has a normal mood and affect. His behavior is normal.         Assessment & Plan:   Physical exam: Screening blood work  ordered Exercise   none Weight  normal Substance abuse   none   Reviewed recommended immunizations.   Health Maintenance  Topic Date Due   OPHTHALMOLOGY EXAM  04/09/2021   Diabetic kidney evaluation - Urine ACR  08/05/2022   DTaP/Tdap/Td (2 - Td or Tdap) 11/30/2022   INFLUENZA VACCINE  12/10/2022   COVID-19 Vaccine (6 - 2023-24 season) 01/10/2023   HEMOGLOBIN A1C  01/14/2023   Diabetic kidney evaluation - eGFR measurement  07/14/2023   Medicare Annual Wellness (AWV)  11/05/2023   FOOT EXAM  12/10/2023   Colonoscopy  11/07/2026   Pneumonia Vaccine 57+ Years old  Completed   Hepatitis C Screening  Completed   Zoster Vaccines- Shingrix  Completed   HPV VACCINES  Aged Out     See Problem List for Assessment and Plan of chronic medical problems.

## 2023-01-13 NOTE — Patient Instructions (Addendum)
Blood work was ordered.   Xrays ordered.    Sports medicine appt for lower back pain x 4 months    Medications changes include :   none    A referral was ordered sports medicine downstairs and someone will call you to schedule an appointment.     Return in about 6 months (around 07/14/2023).    Health Maintenance, Male Adopting a healthy lifestyle and getting preventive care are important in promoting health and wellness. Ask your health care provider about: The right schedule for you to have regular tests and exams. Things you can do on your own to prevent diseases and keep yourself healthy. What should I know about diet, weight, and exercise? Eat a healthy diet  Eat a diet that includes plenty of vegetables, fruits, low-fat dairy products, and lean protein. Do not eat a lot of foods that are high in solid fats, added sugars, or sodium. Maintain a healthy weight Body mass index (BMI) is a measurement that can be used to identify possible weight problems. It estimates body fat based on height and weight. Your health care provider can help determine your BMI and help you achieve or maintain a healthy weight. Get regular exercise Get regular exercise. This is one of the most important things you can do for your health. Most adults should: Exercise for at least 150 minutes each week. The exercise should increase your heart rate and make you sweat (moderate-intensity exercise). Do strengthening exercises at least twice a week. This is in addition to the moderate-intensity exercise. Spend less time sitting. Even light physical activity can be beneficial. Watch cholesterol and blood lipids Have your blood tested for lipids and cholesterol at 71 years of age, then have this test every 5 years. You may need to have your cholesterol levels checked more often if: Your lipid or cholesterol levels are high. You are older than 71 years of age. You are at high risk for heart  disease. What should I know about cancer screening? Many types of cancers can be detected early and may often be prevented. Depending on your health history and family history, you may need to have cancer screening at various ages. This may include screening for: Colorectal cancer. Prostate cancer. Skin cancer. Lung cancer. What should I know about heart disease, diabetes, and high blood pressure? Blood pressure and heart disease High blood pressure causes heart disease and increases the risk of stroke. This is more likely to develop in people who have high blood pressure readings or are overweight. Talk with your health care provider about your target blood pressure readings. Have your blood pressure checked: Every 3-5 years if you are 73-44 years of age. Every year if you are 66 years old or older. If you are between the ages of 63 and 50 and are a current or former smoker, ask your health care provider if you should have a one-time screening for abdominal aortic aneurysm (AAA). Diabetes Have regular diabetes screenings. This checks your fasting blood sugar level. Have the screening done: Once every three years after age 23 if you are at a normal weight and have a low risk for diabetes. More often and at a younger age if you are overweight or have a high risk for diabetes. What should I know about preventing infection? Hepatitis B If you have a higher risk for hepatitis B, you should be screened for this virus. Talk with your health care provider to find out if  you are at risk for hepatitis B infection. Hepatitis C Blood testing is recommended for: Everyone born from [birth year] through 1964. Anyone with known risk factors for hepatitis C. Sexually transmitted infections (STIs) You should be screened each year for STIs, including gonorrhea and chlamydia, if: You are sexually active and are younger than 71 years of age. You are older than 71 years of age and your health care provider tells you  that you are at risk for this type of infection. Your sexual activity has changed since you were last screened, and you are at increased risk for chlamydia or gonorrhea. Ask your health care provider if you are at risk. Ask your health care provider about whether you are at high risk for HIV. Your health care provider may recommend a prescription medicine to help prevent HIV infection. If you choose to take medicine to prevent HIV, you should first get tested for HIV. You should then be tested every 3 months for as long as you are taking the medicine. Follow these instructions at home: Alcohol use Do not drink alcohol if your health care provider tells you not to drink. If you drink alcohol: Limit how much you have to 0-2 drinks a day. Know how much alcohol is in your drink. In the [place] one drink equals one 12 oz bottle of beer (355 mL), one 5 oz glass of wine (148 mL), or one 1 oz glass of hard liquor (44 mL). Lifestyle Do not use any products that contain nicotine or tobacco. These products include cigarettes, chewing tobacco, and vaping devices, such as e-cigarettes. If you need help quitting, ask your health care provider. Do not use street drugs. Do not share needles. Ask your health care provider for help if you need support or information about quitting drugs. General instructions Schedule regular health, dental, and eye exams. Stay current with your vaccines. Tell your health care provider if: You often feel depressed. You have ever been abused or do not feel safe at home. Summary Adopting a healthy lifestyle and getting preventive care are important in promoting health and wellness. Follow your health care provider's instructions about healthy diet, exercising, and getting tested or screened for diseases. Follow your health care provider's instructions on monitoring your cholesterol and blood pressure. This information is not intended to replace advice given to you by your health  care provider. Make sure you discuss any questions you have with your health care provider. Document Revised: 09/16/2020 Document Reviewed: 09/16/2020 Elsevier Patient Education  2024 [employer].

## 2023-01-14 ENCOUNTER — Ambulatory Visit (INDEPENDENT_AMBULATORY_CARE_PROVIDER_SITE_OTHER): Payer: Medicare PPO | Admitting: Internal Medicine

## 2023-01-14 ENCOUNTER — Ambulatory Visit (INDEPENDENT_AMBULATORY_CARE_PROVIDER_SITE_OTHER): Payer: Medicare PPO

## 2023-01-14 VITALS — BP 120/78 | HR 76 | Temp 98.4°F | Ht 71.0 in | Wt 178.0 lb

## 2023-01-14 DIAGNOSIS — E0842 Diabetes mellitus due to underlying condition with diabetic polyneuropathy: Secondary | ICD-10-CM

## 2023-01-14 DIAGNOSIS — E538 Deficiency of other specified B group vitamins: Secondary | ICD-10-CM | POA: Diagnosis not present

## 2023-01-14 DIAGNOSIS — Z0001 Encounter for general adult medical examination with abnormal findings: Secondary | ICD-10-CM

## 2023-01-14 DIAGNOSIS — Z23 Encounter for immunization: Secondary | ICD-10-CM

## 2023-01-14 DIAGNOSIS — I10 Essential (primary) hypertension: Secondary | ICD-10-CM

## 2023-01-14 DIAGNOSIS — Z8673 Personal history of transient ischemic attack (TIA), and cerebral infarction without residual deficits: Secondary | ICD-10-CM

## 2023-01-14 DIAGNOSIS — M47814 Spondylosis without myelopathy or radiculopathy, thoracic region: Secondary | ICD-10-CM | POA: Diagnosis not present

## 2023-01-14 DIAGNOSIS — I5022 Chronic systolic (congestive) heart failure: Secondary | ICD-10-CM | POA: Diagnosis not present

## 2023-01-14 DIAGNOSIS — K219 Gastro-esophageal reflux disease without esophagitis: Secondary | ICD-10-CM | POA: Diagnosis not present

## 2023-01-14 DIAGNOSIS — G8929 Other chronic pain: Secondary | ICD-10-CM | POA: Diagnosis not present

## 2023-01-14 DIAGNOSIS — F0280 Dementia in other diseases classified elsewhere without behavioral disturbance: Secondary | ICD-10-CM

## 2023-01-14 DIAGNOSIS — E7849 Other hyperlipidemia: Secondary | ICD-10-CM | POA: Diagnosis not present

## 2023-01-14 DIAGNOSIS — Z7984 Long term (current) use of oral hypoglycemic drugs: Secondary | ICD-10-CM

## 2023-01-14 DIAGNOSIS — M533 Sacrococcygeal disorders, not elsewhere classified: Secondary | ICD-10-CM | POA: Diagnosis not present

## 2023-01-14 DIAGNOSIS — G309 Alzheimer's disease, unspecified: Secondary | ICD-10-CM

## 2023-01-14 DIAGNOSIS — E1151 Type 2 diabetes mellitus with diabetic peripheral angiopathy without gangrene: Secondary | ICD-10-CM

## 2023-01-14 DIAGNOSIS — F411 Generalized anxiety disorder: Secondary | ICD-10-CM

## 2023-01-14 DIAGNOSIS — M545 Low back pain, unspecified: Secondary | ICD-10-CM | POA: Diagnosis not present

## 2023-01-14 DIAGNOSIS — Z0000 Encounter for general adult medical examination without abnormal findings: Secondary | ICD-10-CM

## 2023-01-14 DIAGNOSIS — M47816 Spondylosis without myelopathy or radiculopathy, lumbar region: Secondary | ICD-10-CM | POA: Diagnosis not present

## 2023-01-14 LAB — CBC WITH DIFFERENTIAL/PLATELET
Basophils Absolute: 0 10*3/uL (ref 0.0–0.1)
Basophils Relative: 0.3 % (ref 0.0–3.0)
Eosinophils Absolute: 0.1 10*3/uL (ref 0.0–0.7)
Eosinophils Relative: 1.6 % (ref 0.0–5.0)
HCT: 38.4 % — ABNORMAL LOW (ref 39.0–52.0)
Hemoglobin: 12 g/dL — ABNORMAL LOW (ref 13.0–17.0)
Lymphocytes Relative: 33.8 % (ref 12.0–46.0)
Lymphs Abs: 1.4 10*3/uL (ref 0.7–4.0)
MCHC: 31.3 g/dL (ref 30.0–36.0)
MCV: 73.3 fl — ABNORMAL LOW (ref 78.0–100.0)
Monocytes Absolute: 0.4 10*3/uL (ref 0.1–1.0)
Monocytes Relative: 9.9 % (ref 3.0–12.0)
Neutro Abs: 2.3 10*3/uL (ref 1.4–7.7)
Neutrophils Relative %: 54.4 % (ref 43.0–77.0)
Platelets: 207 10*3/uL (ref 150.0–400.0)
RBC: 5.24 Mil/uL (ref 4.22–5.81)
RDW: 15.6 % — ABNORMAL HIGH (ref 11.5–15.5)
WBC: 4.2 10*3/uL (ref 4.0–10.5)

## 2023-01-14 LAB — MICROALBUMIN / CREATININE URINE RATIO
Creatinine,U: 233 mg/dL
Microalb Creat Ratio: 3.1 mg/g (ref 0.0–30.0)
Microalb, Ur: 7.3 mg/dL — ABNORMAL HIGH (ref 0.0–1.9)

## 2023-01-14 LAB — COMPREHENSIVE METABOLIC PANEL
ALT: 10 U/L (ref 0–53)
AST: 12 U/L (ref 0–37)
Albumin: 4.1 g/dL (ref 3.5–5.2)
Alkaline Phosphatase: 95 U/L (ref 39–117)
BUN: 7 mg/dL (ref 6–23)
CO2: 31 meq/L (ref 19–32)
Calcium: 9.6 mg/dL (ref 8.4–10.5)
Chloride: 106 meq/L (ref 96–112)
Creatinine, Ser: 0.97 mg/dL (ref 0.40–1.50)
GFR: 78.46 mL/min (ref >60.00)
Glucose, Bld: 110 mg/dL — ABNORMAL HIGH (ref 70–99)
Potassium: 4.1 meq/L (ref 3.5–5.1)
Sodium: 144 meq/L (ref 135–145)
Total Bilirubin: 0.8 mg/dL (ref 0.2–1.2)
Total Protein: 7.5 g/dL (ref 6.0–8.3)

## 2023-01-14 LAB — LIPID PANEL
Cholesterol: 93 mg/dL (ref 0–200)
HDL: 39.3 mg/dL (ref >39.00)
LDL Cholesterol: 45 mg/dL (ref 0–99)
NonHDL: 54.15
Total CHOL/HDL Ratio: 2
Triglycerides: 47 mg/dL (ref 0.0–149.0)
VLDL: 9.4 mg/dL (ref 0.0–40.0)

## 2023-01-14 LAB — VITAMIN B12: Vitamin B-12: 1308 pg/mL — ABNORMAL HIGH (ref 211–911)

## 2023-01-14 LAB — HEMOGLOBIN A1C: Hgb A1c MFr Bld: 6.1 % (ref 4.6–6.5)

## 2023-01-14 LAB — TSH: TSH: 2.52 u[IU]/mL (ref 0.35–5.50)

## 2023-01-14 MED ORDER — DONEPEZIL HCL 10 MG PO TABS: 10.0000 mg | ORAL_TABLET | Freq: Every day | ORAL | Status: DC

## 2023-01-14 NOTE — Assessment & Plan Note (Signed)
Chronic Check lipid panel, CMP Continue atorvastatin 20 mg daily 4 days a week, 10 mg 3 days a week

## 2023-01-14 NOTE — Assessment & Plan Note (Signed)
Chronic Euvolemic on exam Follows with cardiology Continue current medications

## 2023-01-14 NOTE — Assessment & Plan Note (Signed)
Chronic Following with neurology Continue donepezil 23 mg daily Namenda not tolerated secondary to diarrhea Encourage regular exercise

## 2023-01-14 NOTE — Assessment & Plan Note (Signed)
History of TIA Continue atorvastatin 20 mg daily, Plavix 75 mg daily Blood pressure controlled

## 2023-01-14 NOTE — Assessment & Plan Note (Signed)
Chronic   Lab Results  Component Value Date   HGBA1C 5.9 07/14/2022   Sugars well controlled Check A1c, urine microalbumin Continue Janumet 50-1000 mg daily Stressed regular exercise, diabetic diet

## 2023-01-14 NOTE — Assessment & Plan Note (Signed)
Chronic GERD controlled History of Barrett's esophagus-last EGD 2021 Continue famotidine 20 mg daily, pantoprazole 40 mg daily

## 2023-01-14 NOTE — Assessment & Plan Note (Signed)
Chronic Previous EMG did show probable diabetic neuropathy Advised checking his feet daily Advised not going barefoot

## 2023-01-14 NOTE — Assessment & Plan Note (Signed)
Chronic Blood pressure well controlled CMP Continue Coreg 25 mg twice daily, losartan 25 mg daily, sotalol 120 mg twice daily

## 2023-01-14 NOTE — Assessment & Plan Note (Signed)
Chronic Taking B12 daily Check B12 level 

## 2023-01-15 NOTE — Progress Notes (Unsigned)
    [name] D.[name] [address] [place] [address] Phone: [phone]   Assessment and Plan:     There are no diagnoses linked to this encounter.  ***   Pertinent previous records reviewed include ***   Follow Up: ***     Subjective:   I, Pink Maye, am serving as a Neurosurgeon for Doctor Richardean Sale  Chief Complaint: low back pain   HPI:   01/18/2023 Patient is a 72 year old male complaining of low back pain. Patient states  Relevant Historical Information: ***  Additional pertinent review of systems negative.   Current Outpatient Medications:    ACCU-CHEK GUIDE test strip, USE TO CHECK SUGAR 4 TIMES A DAY, Disp: 100 strip, Rfl: 4   acetaminophen (TYLENOL) 500 MG tablet, Take 1,000 mg by mouth every 6 (six) hours as needed for mild pain., Disp: , Rfl:    atorvastatin (LIPITOR) 20 MG tablet, Take by mouth., Disp: , Rfl:    carvedilol (COREG) 25 MG tablet, TAKE 1 TABLET TWICE DAILY WITH MEALS, Disp: 180 tablet, Rfl: 2   Cholecalciferol (VITAMIN D) 50 MCG (2000 UT) tablet, Take 2,000 Units by mouth daily., Disp: , Rfl:    clopidogrel (PLAVIX) 75 MG tablet, TAKE 1 TABLET EVERY DAY, Disp: 90 tablet, Rfl: 3   cyanocobalamin (VITAMIN B12) 1000 MCG tablet, TAKE 1 TABLET EVERY DAY, Disp: 90 tablet, Rfl: 3   digoxin (LANOXIN) 0.125 MG tablet, TAKE 1 TABLET EVERY DAY, Disp: 90 tablet, Rfl: 3   docusate sodium (COLACE) 100 MG capsule, Take 1 capsule (100 mg total) by mouth 2 (two) times daily as needed for mild constipation., Disp: 60 capsule, Rfl: 2   donepezil (ARICEPT) 10 MG tablet, Take 1 tablet (10 mg total) by mouth daily., Disp: , Rfl:    famotidine (PEPCID) 20 MG tablet, Take 1 tablet (20 mg total) by mouth daily., Disp: 90 tablet, Rfl: 3   JANUMET 50-1000 MG tablet, TAKE 1 TABLET EVERY DAY WITH A MEAL, Disp: 90 tablet, Rfl: 10   Lancets (ONETOUCH ULTRASOFT) lancets, Use as instructed to test sugar daily and prn. Dx  E11.9, Disp: 100 each, Rfl: 12   losartan (COZAAR) 25 MG tablet, TAKE 1 TABLET (25 MG TOTAL) BY MOUTH AT BEDTIME., Disp: 90 tablet, Rfl: 3   pantoprazole (PROTONIX) 40 MG tablet, TAKE 1 TABLET EVERY DAY, Disp: 90 tablet, Rfl: 1   sotalol (BETAPACE) 120 MG tablet, TAKE 1 TABLET TWICE DAILY, Disp: 180 tablet, Rfl: 3   Objective:     There were no vitals filed for this visit.    There is no height or weight on file to calculate BMI.    Physical Exam:    ***   Electronically signed by:  [name] D.[name] 7:32 AM 01/15/23

## 2023-01-17 IMAGING — CT CT L SPINE W/O CM
1 of 7 series · 6 of 14 positions shown, 8 images · non-contrast
Comparison: No prior CT of the lumbar spine, correlation is made
with 06/05/2020 lumbar spine radiographs

CLINICAL DATA: Low back pain



[Series 3: l spine soft · axial · 0.32mm/px · z∈[-341,-161]mm · 6 of 128 slices shown, 8 images]
[im 19/128  soft-tissue]
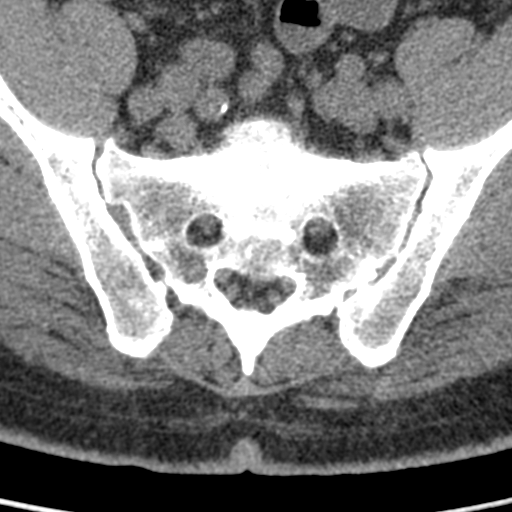
[im 19/128  bone]
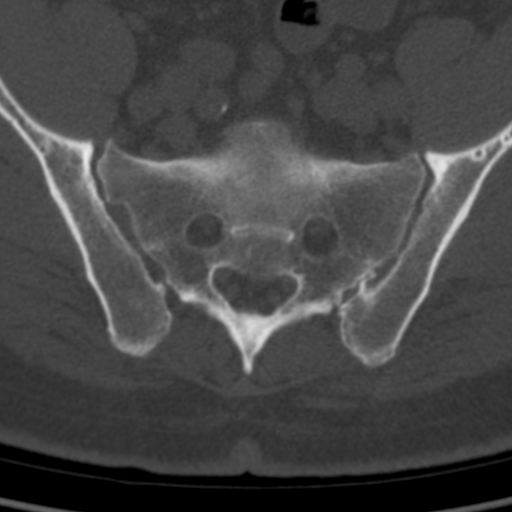
[im 37/128  bone]
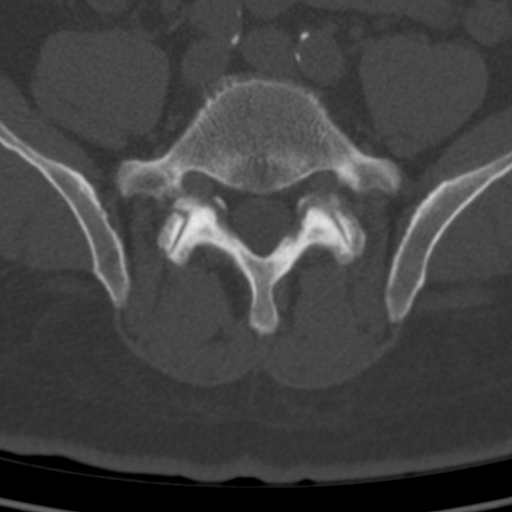
[im 55/128  bone]
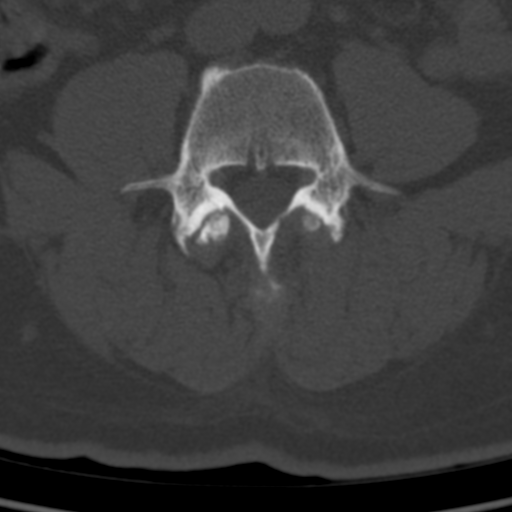
[im 73/128  bone]
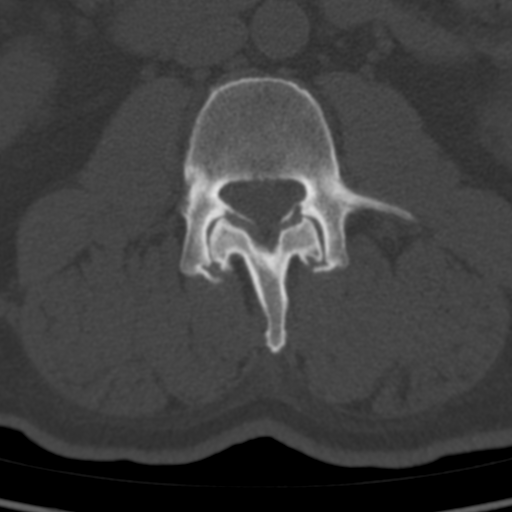
[im 91/128  soft-tissue]
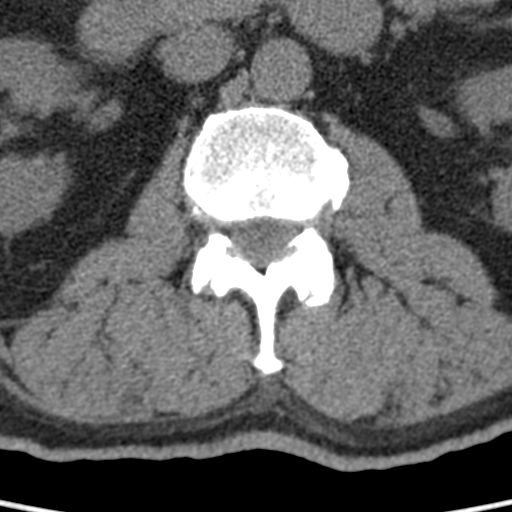
[im 91/128  bone]
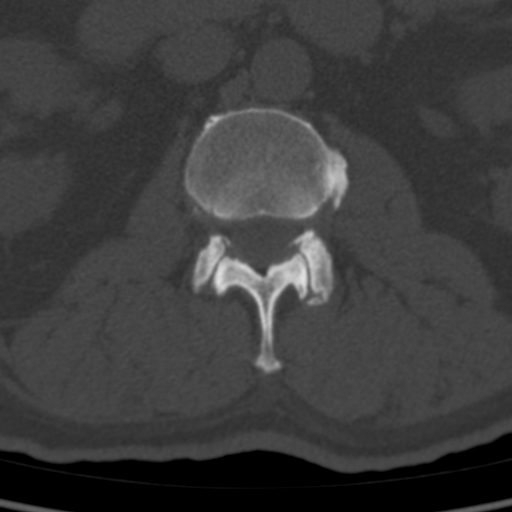
[im 109/128  bone]
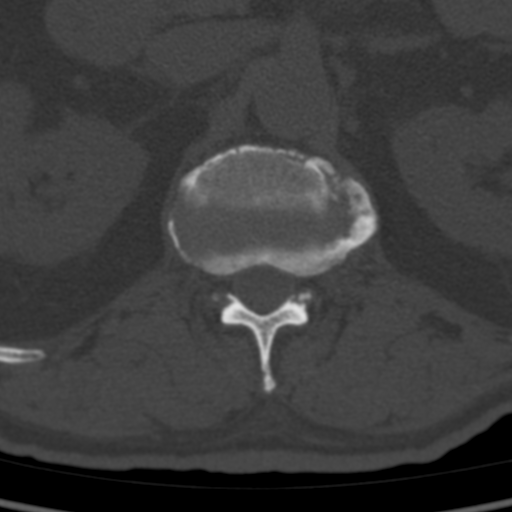

[6 of 14 positions shown; findings below may reference images not displayed]

FINDINGS: Segmentation: 5 lumbar-type vertebral bodies, with hypoplastic ribs
at T12.

Alignment: Normal.

Vertebrae: No acute fracture or suspicious osseous lesion. Vertebral
body heights are maintained.

Paraspinal and other soft tissues: Negative.

Disc levels:

T12-L1: Minimal disc bulge. Mild-to-moderate facet arthropathy. No
spinal canal stenosis no neural foraminal narrowing.

L1-L2: Minimal disc bulge. Mild-to-moderate facet arthropathy. No
spinal canal stenosis. Mild right neural foraminal narrowing.

L2-L3: Minimal disc bulge. Moderate facet arthropathy. Ligamentum
flavum hypertrophy. No spinal canal stenosis. Mild bilateral neural
foraminal narrowing.

L3-L4: Mild disc bulge. Right-greater-than-left mild-to-moderate
facet arthropathy. Ligamentum flavum hypertrophy. Narrowing of the
lateral recesses. No spinal canal stenosis. Mild bilateral neural
foraminal narrowing.

L4-L5: Moderate disc bulge. Mild-to-moderate right-greater-than-left
facet arthropathy. No spinal canal stenosis. Narrowing of the
lateral recesses. Mild bilateral neural foraminal narrowing.

L5-S1: Minimal disc bulge. Moderate facet arthropathy. No spinal
canal stenosis. Mild right neural foraminal narrowing.
IMPRESSION: 1. Multilevel facet arthropathy, which causes mild neural foraminal
narrowing bilaterally at L2-L3, L3-L4, and L4-L5, as well as on the
right at L1-L2 and L5-S1.
2. No spinal canal stenosis. Narrowing of the lateral recesses at
L3-L4 and L4-L5 could affect the descending L4 and L5 nerve roots,
respectively.

## 2023-01-18 ENCOUNTER — Ambulatory Visit (INDEPENDENT_AMBULATORY_CARE_PROVIDER_SITE_OTHER): Payer: Medicare PPO

## 2023-01-18 ENCOUNTER — Ambulatory Visit: Payer: Medicare PPO | Admitting: Sports Medicine

## 2023-01-18 VITALS — BP 122/80 | HR 64 | Ht 71.0 in | Wt 178.0 lb

## 2023-01-18 DIAGNOSIS — M545 Low back pain, unspecified: Secondary | ICD-10-CM | POA: Diagnosis not present

## 2023-01-18 DIAGNOSIS — M533 Sacrococcygeal disorders, not elsewhere classified: Secondary | ICD-10-CM

## 2023-01-18 DIAGNOSIS — M47816 Spondylosis without myelopathy or radiculopathy, lumbar region: Secondary | ICD-10-CM | POA: Diagnosis not present

## 2023-01-18 NOTE — Patient Instructions (Addendum)
Wedge pillow , tailbone pillow, or coccxy pillow on amazon  Tylenol [phone] mg 2-3 times a day for pain relief  Xrays on the way out  3-4 week follow up

## 2023-01-19 ENCOUNTER — Other Ambulatory Visit (HOSPITAL_BASED_OUTPATIENT_CLINIC_OR_DEPARTMENT_OTHER): Payer: Self-pay

## 2023-01-19 MED ORDER — COVID-19 MRNA VAC-TRIS(PFIZER) 30 MCG/0.3ML IM SUSY
0.3000 mL | PREFILLED_SYRINGE | Freq: Once | INTRAMUSCULAR | 0 refills | Status: AC
  Filled 2023-01-19: qty 0.3, 1d supply, fill #0

## 2023-01-19 MED ORDER — TETANUS-DIPHTH-ACELL PERTUSSIS 5-2.5-18.5 LF-MCG/0.5 IM SUSY
0.5000 mL | PREFILLED_SYRINGE | Freq: Once | INTRAMUSCULAR | 0 refills | Status: AC
  Filled 2023-01-19: qty 0.5, 1d supply, fill #0

## 2023-01-21 ENCOUNTER — Other Ambulatory Visit: Payer: Self-pay | Admitting: Internal Medicine

## 2023-01-28 ENCOUNTER — Other Ambulatory Visit: Payer: Self-pay | Admitting: Internal Medicine

## 2023-02-02 NOTE — Progress Notes (Signed)
Remote ICD transmission.   

## 2023-02-08 ENCOUNTER — Ambulatory Visit: Payer: Medicare PPO | Admitting: Sports Medicine

## 2023-02-08 ENCOUNTER — Encounter: Payer: Self-pay | Admitting: Internal Medicine

## 2023-02-08 VITALS — BP 130/78 | HR 77 | Ht 71.0 in | Wt 181.0 lb

## 2023-02-08 DIAGNOSIS — M533 Sacrococcygeal disorders, not elsewhere classified: Secondary | ICD-10-CM | POA: Diagnosis not present

## 2023-02-08 NOTE — Progress Notes (Signed)
[name] [address] [place] [address] Phone: [phone]   Assessment and Plan:     1. Coccyx pain - Chronic with exacerbation, subsequent visit - Still most consistent with coccydynia after patient fell on his tailbone 5 months ago - No significant change in symptoms, however patient forgot to purchase a tailbone/coccyx wedge pillow to use when sitting at all times.  Reinstructed to by wedge pillow and use it at all times to decrease tension/pressure over coccyx - Continue Tylenol 650 mg twice daily for pain relief - Further discussed that I do not recommend NSAID or prednisone use due to patient's past medical history   Pertinent previous records reviewed include none   Follow Up: As needed if no improvement or worsening of symptoms.  Could consider coccyx CSI   Subjective:   I, [name], am serving as a Neurosurgeon for Doctor Richardean Sale   Chief Complaint: low back pain    HPI:    01/18/2023 Patient is a 72 year old male complaining of low back pain. Patient states that he fell 4 months of time ago and his tailbone. He has constant pain. Hx of TIA 12/2021 he does endorse his gait was altered.  His pain radiates up the back. Tylenol for the pain and that helps ease the pain. No numbness or tingling  02/08/2023 Patient states he is the same .    Relevant Historical Information: Hypertension, GERD, DM type II, CHF  Additional pertinent review of systems negative.   Current Outpatient Medications:    ACCU-CHEK GUIDE test strip, USE TO CHECK SUGAR 4 TIMES A DAY, Disp: 100 strip, Rfl: 4   acetaminophen (TYLENOL) 500 MG tablet, Take 1,000 mg by mouth every 6 (six) hours as needed for mild pain., Disp: , Rfl:    atorvastatin (LIPITOR) 20 MG tablet, Take by mouth., Disp: , Rfl:    carvedilol (COREG) 25 MG tablet, TAKE 1 TABLET TWICE DAILY WITH MEALS, Disp: 180 tablet, Rfl: 2   Cholecalciferol (VITAMIN D) 50 MCG  (2000 UT) tablet, Take 2,000 Units by mouth daily., Disp: , Rfl:    clopidogrel (PLAVIX) 75 MG tablet, TAKE 1 TABLET EVERY DAY, Disp: 90 tablet, Rfl: 3   cyanocobalamin (VITAMIN B12) 1000 MCG tablet, TAKE 1 TABLET EVERY DAY, Disp: 90 tablet, Rfl: 3   digoxin (LANOXIN) 0.125 MG tablet, TAKE 1 TABLET EVERY DAY, Disp: 90 tablet, Rfl: 3   docusate sodium (COLACE) 100 MG capsule, Take 1 capsule (100 mg total) by mouth 2 (two) times daily as needed for mild constipation., Disp: 60 capsule, Rfl: 2   donepezil (ARICEPT) 10 MG tablet, Take 1 tablet (10 mg total) by mouth daily., Disp: , Rfl:    famotidine (PEPCID) 20 MG tablet, Take 1 tablet (20 mg total) by mouth daily., Disp: 90 tablet, Rfl: 3   JANUMET 50-1000 MG tablet, TAKE 1 TABLET EVERY DAY WITH A MEAL, Disp: 90 tablet, Rfl: 10   Lancets (ONETOUCH ULTRASOFT) lancets, Use as instructed to test sugar daily and prn. Dx E11.9, Disp: 100 each, Rfl: 12   losartan (COZAAR) 25 MG tablet, TAKE 1 TABLET (25 MG TOTAL) BY MOUTH AT BEDTIME., Disp: 90 tablet, Rfl: 3   pantoprazole (PROTONIX) 40 MG tablet, TAKE 1 TABLET EVERY DAY, Disp: 90 tablet, Rfl: 3   sotalol (BETAPACE) 120 MG tablet, TAKE 1 TABLET TWICE DAILY, Disp: 180 tablet, Rfl: 3   Objective:     Vitals:   02/08/23  1422  BP: 130/78  Pulse: 77  SpO2: 98%  Weight: 181 lb (82.1 kg)  Height: 5\' 11"  (1.803 m)      Body mass index is 25.24 kg/m.    Physical Exam:    Gen: Appears well, nad, nontoxic and pleasant Psych: Alert and oriented, appropriate mood and affect Neuro: sensation intact, strength is 5/5 in upper and lower extremities, muscle tone wnl Skin: no susupicious lesions or rashes   Back - Normal skin, Spine with normal alignment and no deformity.   No tenderness to vertebral process palpation.   Paraspinous muscles are not tender and without spasm NTTP gluteal musculature Straight leg raise negative Trendelenberg negative Piriformis Test negative Gait normal TTP coccyx     Electronically signed by:  [name] 2:40 PM 02/08/23

## 2023-02-08 NOTE — Patient Instructions (Signed)
Recommend buy a tailbone, coccyx or wedge pillow aka a tush guard on amazon  Tylenol 650 mg for pain relief As needed follow up if no improvement 4-6 week follow up

## 2023-02-08 NOTE — Progress Notes (Unsigned)
Subjective:    Patient ID: [name], male    DOB: 10/09/1950, 73 y.o.   MRN: [id number]      HPI [name] is here for No chief complaint on file.    Gross hematuria -    Last saw urology 09/2022 - h/o prostate ca - dx 2005 - w/p RRP.  Had biochemical recurrence and is w/p external beam radiation in 2016.  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  No dysuria or hematuria at that time.  Had Ct scan w w/o contrast at that time.  At that time advised OR for urethral balloon dilation and r/o bladder cancer. .     Medications and allergies reviewed with patient and updated if appropriate.  Current Outpatient Medications on File Prior to Visit  Medication Sig Dispense Refill   ACCU-CHEK GUIDE test strip USE TO CHECK SUGAR 4 TIMES A DAY 100 strip 4   acetaminophen (TYLENOL) 500 MG tablet Take 1,000 mg by mouth every 6 (six) hours as needed for mild pain.     atorvastatin (LIPITOR) 20 MG tablet Take by mouth.     carvedilol (COREG) 25 MG tablet TAKE 1 TABLET TWICE DAILY WITH MEALS 180 tablet 2   Cholecalciferol (VITAMIN D) 50 MCG (2000 UT) tablet Take 2,000 Units by mouth daily.     clopidogrel (PLAVIX) 75 MG tablet TAKE 1 TABLET EVERY DAY 90 tablet 3   cyanocobalamin (VITAMIN B12) 1000 MCG tablet TAKE 1 TABLET EVERY DAY 90 tablet 3   digoxin (LANOXIN) 0.125 MG tablet TAKE 1 TABLET EVERY DAY 90 tablet 3   docusate sodium (COLACE) 100 MG capsule Take 1 capsule (100 mg total) by mouth 2 (two) times daily as needed for mild constipation. 60 capsule 2   donepezil (ARICEPT) 10 MG tablet Take 1 tablet (10 mg total) by mouth daily.     famotidine (PEPCID) 20 MG tablet Take 1 tablet (20 mg total) by mouth daily. 90 tablet 3   JANUMET 50-1000 MG tablet TAKE 1 TABLET EVERY DAY WITH A MEAL 90 tablet 10   Lancets (ONETOUCH ULTRASOFT) lancets Use as instructed to test sugar daily and prn. Dx E11.9 100 each 12   losartan  (COZAAR) 25 MG tablet TAKE 1 TABLET (25 MG TOTAL) BY MOUTH AT BEDTIME. 90 tablet 3   pantoprazole (PROTONIX) 40 MG tablet TAKE 1 TABLET EVERY DAY 90 tablet 3   sotalol (BETAPACE) 120 MG tablet TAKE 1 TABLET TWICE DAILY 180 tablet 3   No current facility-administered medications on file prior to visit.    Review of Systems     Objective:  There were no vitals filed for this visit. BP Readings from Last 3 Encounters:  02/08/23 130/78  01/18/23 122/80  01/14/23 120/78   Wt Readings from Last 3 Encounters:  02/08/23 181 lb (82.1 kg)  01/18/23 178 lb (80.7 kg)  01/14/23 178 lb (80.7 kg)   There is no height or weight on file to calculate BMI.    Physical Exam         Assessment & Plan:    See Problem List for Assessment and Plan of chronic medical problems.

## 2023-02-09 ENCOUNTER — Ambulatory Visit: Payer: Medicare PPO | Admitting: Internal Medicine

## 2023-02-09 ENCOUNTER — Encounter: Payer: Self-pay | Admitting: Internal Medicine

## 2023-02-09 VITALS — BP 140/80 | HR 75 | Temp 98.0°F | Ht 71.0 in | Wt 180.0 lb

## 2023-02-09 DIAGNOSIS — R310 Gross hematuria: Secondary | ICD-10-CM | POA: Diagnosis not present

## 2023-02-09 DIAGNOSIS — I10 Essential (primary) hypertension: Secondary | ICD-10-CM

## 2023-02-09 LAB — POC URINALSYSI DIPSTICK (AUTOMATED)
Glucose, UA: NEGATIVE
Ketones, UA: NEGATIVE
Leukocytes, UA: NEGATIVE
Nitrite, UA: NEGATIVE
Protein, UA: POSITIVE — AB
Spec Grav, UA: >=1.03 — AB (ref 1.010–1.025)
Urobilinogen, UA: 1 U/dL
pH, UA: 6 (ref 5.0–8.0)

## 2023-02-09 NOTE — Assessment & Plan Note (Signed)
Chronic Blood pressure well controlled Continue Coreg 25 mg twice daily, losartan 25 mg daily, sotalol 120 mg twice daily

## 2023-02-09 NOTE — Assessment & Plan Note (Signed)
Subacute Has been going on for weeks Has seen gross hematuria intermittently, but has had persistent microscopic hematuria for months Follows with urology-last visit 09/2022 History of prostate cancer s/p radiation.  Has been known urethral stricture and urology at his last visit advised urethral dilation and cystoscopy to evaluate bladder Urine dip here with blood, but no evidence of infection.  Will send for culture, but doubt infection Discussed bleeding is likely secondary to radiation therapy for his prostate cancer Needs to follow-up with urology and needs to undergo the procedure recommended by them-cystoscopy and urethral dilation He will call today and set up an appointment

## 2023-02-09 NOTE — Patient Instructions (Addendum)
     Your urine show blood but no obvious infection - we will send it for culture.     You should follow up with urology for blood that is visible in your urine for weeks.      Medications changes include :   none

## 2023-02-11 DIAGNOSIS — R310 Gross hematuria: Secondary | ICD-10-CM | POA: Diagnosis not present

## 2023-02-11 DIAGNOSIS — C61 Malignant neoplasm of prostate: Secondary | ICD-10-CM | POA: Diagnosis not present

## 2023-02-11 DIAGNOSIS — N35011 Post-traumatic bulbous urethral stricture: Secondary | ICD-10-CM | POA: Diagnosis not present

## 2023-02-12 ENCOUNTER — Encounter: Payer: Self-pay | Admitting: Internal Medicine

## 2023-02-12 LAB — CULTURE, URINE COMPREHENSIVE

## 2023-02-12 MED ORDER — NITROFURANTOIN MONOHYD MACRO 100 MG PO CAPS: 100.0000 mg | ORAL_CAPSULE | Freq: Two times a day (BID) | ORAL | 0 refills | Status: DC

## 2023-02-12 NOTE — Addendum Note (Signed)
Addended by: Pincus Sanes on: 02/12/2023 02:27 PM   Modules accepted: Orders

## 2023-02-17 ENCOUNTER — Other Ambulatory Visit: Payer: Self-pay | Admitting: Internal Medicine

## 2023-03-04 ENCOUNTER — Telehealth: Payer: Self-pay | Admitting: *Deleted

## 2023-03-04 ENCOUNTER — Telehealth: Payer: Self-pay | Admitting: Internal Medicine

## 2023-03-04 ENCOUNTER — Other Ambulatory Visit: Payer: Self-pay | Admitting: Urology

## 2023-03-04 NOTE — Telephone Encounter (Signed)
Pre-operative Risk Assessment    Patient Name: [name]  DOB: 01-26-1951 MRN: [id number]      Request for Surgical Clearance    Procedure:   Cystoscopy with urethral dilation  Date of Surgery:  Clearance 04/15/23                                 Surgeon:  Dr. Vallery Ridge Group or Practice Name:  [facility]  Phone number:  [phone]  Fax number:  [phone]   Type of Clearance Requested:   - Medical  - Pharmacy:  Hold Aspirin and Clopidogrel (Plavix)     Type of Anesthesia:  General    Additional requests/questions:   Caller Shanda Bumps) stated patient will need medical and pharmacy clearance.  Signed, Annetta Maw   03/04/2023, 11:11 AM

## 2023-03-04 NOTE — Telephone Encounter (Signed)
Primary Cardiologist:None   Preoperative team, please contact this patient and set up a phone call appointment for further preoperative risk assessment. Please obtain consent and complete medication review. Thank you for your help.   Plavix is prescribed for history of TIA/CVA and should be addressed by neurology.  I also confirmed the patient resides in the state of [place]. As per [facility] Medical Board telemedicine laws, the patient must reside in the state in which the provider is licensed.   Levi Aland, NP-C  03/04/2023, 11:29 AM [address], [address] Office [phone] Fax [phone]

## 2023-03-04 NOTE — Telephone Encounter (Signed)
Pt has been scheduled for tele pre op appt 03/29/23. Med rec and consent are done.

## 2023-03-04 NOTE — Telephone Encounter (Signed)
Pt has been scheduled for tele pre op appt 03/29/23. Med rec and consent are done.    Patient Consent for Virtual Visit        [name] has provided verbal consent on 03/04/2023 for a virtual visit (video or telephone).   CONSENT FOR VIRTUAL VISIT FOR:  [name]  By participating in this virtual visit I agree to the following:  I hereby voluntarily request, consent and authorize [facility] and its employed or contracted physicians, physician assistants, nurse practitioners or other licensed health care professionals (the Practitioner), to provide me with telemedicine health care services (the "Services") as deemed necessary by the treating Practitioner. I acknowledge and consent to receive the Services by the Practitioner via telemedicine. I understand that the telemedicine visit will involve communicating with the Practitioner through live audiovisual communication technology and the disclosure of certain medical information by electronic transmission. I acknowledge that I have been given the opportunity to request an in-person assessment or other available alternative prior to the telemedicine visit and am voluntarily participating in the telemedicine visit.  I understand that I have the right to withhold or withdraw my consent to the use of telemedicine in the course of my care at any time, without affecting my right to future care or treatment, and that the Practitioner or I may terminate the telemedicine visit at any time. I understand that I have the right to inspect all information obtained and/or recorded in the course of the telemedicine visit and may receive copies of available information for a reasonable fee.  I understand that some of the potential risks of receiving the Services via telemedicine include:  Delay or interruption in medical evaluation due to technological equipment failure or disruption; Information transmitted may not be sufficient (e.g. poor  resolution of images) to allow for appropriate medical decision making by the Practitioner; and/or  In rare instances, security protocols could fail, causing a breach of personal health information.  Furthermore, I acknowledge that it is my responsibility to provide information about my medical history, conditions and care that is complete and accurate to the best of my ability. I acknowledge that Practitioner's advice, recommendations, and/or decision may be based on factors not within their control, such as incomplete or inaccurate data provided by me or distortions of diagnostic images or specimens that may result from electronic transmissions. I understand that the practice of medicine is not an exact science and that Practitioner makes no warranties or guarantees regarding treatment outcomes. I acknowledge that a copy of this consent can be made available to me via my patient portal [facility] MyChart), or I can request a printed copy by calling the office of  [facility].    I understand that my insurance will be billed for this visit.   I have read or had this consent read to me. I understand the contents of this consent, which adequately explains the benefits and risks of the Services being provided via telemedicine.  I have been provided ample opportunity to ask questions regarding this consent and the Services and have had my questions answered to my satisfaction. I give my informed consent for the services to be provided through the use of telemedicine in my medical care

## 2023-03-12 ENCOUNTER — Ambulatory Visit: Payer: Medicare PPO | Admitting: Podiatry

## 2023-03-12 ENCOUNTER — Encounter: Payer: Self-pay | Admitting: Podiatry

## 2023-03-12 DIAGNOSIS — B351 Tinea unguium: Secondary | ICD-10-CM

## 2023-03-12 DIAGNOSIS — E0842 Diabetes mellitus due to underlying condition with diabetic polyneuropathy: Secondary | ICD-10-CM

## 2023-03-12 DIAGNOSIS — M79674 Pain in right toe(s): Secondary | ICD-10-CM

## 2023-03-12 DIAGNOSIS — M79675 Pain in left toe(s): Secondary | ICD-10-CM

## 2023-03-12 NOTE — Progress Notes (Signed)

## 2023-03-14 ENCOUNTER — Other Ambulatory Visit: Payer: Self-pay | Admitting: Internal Medicine

## 2023-03-18 ENCOUNTER — Ambulatory Visit (INDEPENDENT_AMBULATORY_CARE_PROVIDER_SITE_OTHER): Payer: Medicare PPO

## 2023-03-18 ENCOUNTER — Encounter: Payer: Self-pay | Admitting: Internal Medicine

## 2023-03-18 DIAGNOSIS — I428 Other cardiomyopathies: Secondary | ICD-10-CM | POA: Diagnosis not present

## 2023-03-18 LAB — CUP PACEART REMOTE DEVICE CHECK
Battery Remaining Longevity: 21 mo
Battery Voltage: 2.92 V
Brady Statistic RV Percent Paced: 0.08 %
Date Time Interrogation Session: 20241107012303
HighPow Impedance: 59 Ohm
Implantable Lead Connection Status: 753985
Implantable Lead Implant Date: 20140530
Implantable Lead Location: 753860
Implantable Lead Model: 6935
Implantable Pulse Generator Implant Date: 20140530
Lead Channel Impedance Value: 361 Ohm
Lead Channel Impedance Value: 418 Ohm
Lead Channel Pacing Threshold Amplitude: 0.75 V
Lead Channel Pacing Threshold Pulse Width: 0.4 ms
Lead Channel Sensing Intrinsic Amplitude: 6.25 mV
Lead Channel Sensing Intrinsic Amplitude: 6.25 mV
Lead Channel Setting Pacing Amplitude: 2.5 V
Lead Channel Setting Pacing Pulse Width: 0.4 ms
Lead Channel Setting Sensing Sensitivity: 0.3 mV

## 2023-03-23 DIAGNOSIS — G4733 Obstructive sleep apnea (adult) (pediatric): Secondary | ICD-10-CM | POA: Diagnosis not present

## 2023-03-24 ENCOUNTER — Encounter: Payer: Self-pay | Admitting: Internal Medicine

## 2023-03-24 ENCOUNTER — Encounter (HOSPITAL_BASED_OUTPATIENT_CLINIC_OR_DEPARTMENT_OTHER): Payer: Self-pay | Admitting: Pulmonary Disease

## 2023-03-27 ENCOUNTER — Other Ambulatory Visit: Payer: Self-pay | Admitting: Internal Medicine

## 2023-03-29 ENCOUNTER — Encounter: Payer: Self-pay | Admitting: Physician Assistant

## 2023-03-29 ENCOUNTER — Ambulatory Visit: Payer: Medicare PPO | Attending: Cardiology | Admitting: Cardiology

## 2023-03-29 DIAGNOSIS — Z01810 Encounter for preprocedural cardiovascular examination: Secondary | ICD-10-CM

## 2023-03-29 NOTE — Telephone Encounter (Signed)
There are no contraindications from Neurology standpoint, but  He would need Cards (including Plavix issues )  and Pulmonary clearance. Thanks

## 2023-03-29 NOTE — Progress Notes (Signed)
Virtual Visit via Telephone Note   Because of [name]'s co-morbid illnesses, he is at least at moderate risk for complications without adequate follow up.  This format is felt to be most appropriate for this patient at this time.  The patient did not have access to video technology/had technical difficulties with video requiring transitioning to audio format only (telephone).  All issues noted in this document were discussed and addressed.  No physical exam could be performed with this format.  Please refer to the patient's chart for his consent to telehealth for [facility].  Evaluation Performed:  Preoperative cardiovascular risk assessment _____________   Date:  03/29/2023   Patient ID:  [name], DOB 06-Mar-1951, MRN [id number] Patient Location:  Home Provider location:   Office  Primary Care Provider:  Pincus Sanes, MD Primary Cardiologist:  Lewayne Bunting, MD  Chief Complaint / Patient Profile   72 y.o. y/o male with a h/o chronic systolic heart failure, s/p ICD insertion with generator change in 2014, h/o VT and HTN who is pending cystoscopy with urethral dilation and presents today for telephonic preoperative cardiovascular risk assessment.  History of Present Illness    [name] is a 72 y.o. male who presents via audio/video conferencing for a telehealth visit today.  Pt was last seen in cardiology clinic on 05/12/2022 by Dr. Lewayne Bunting.  At that time CESAREO SOTOLONGO was doing well.  The patient is now pending procedure as outlined above. Since his last visit, he has remained stable from a cardiac perspective. Today he denies chest pain, palpitations, dyspnea, pnd, orthopnea, dizziness, syncope, edema, weight gain, or early satiety.   Past Medical History    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   Not compatible with  MRI    Barrett's esophagus 09/05/2004   2 cm changes seen at index EGD   Cataract    Chronic back pain 04/25/2019   Chronic systolic heart failure    Colon polyps    tubular adenoma   Diabetic neuropathy 01/14/2022   Difficulty in walking 10/02/2021   Dizziness 01/19/2011   Dysrhythmia    Essential hypertension 08/26/2009   Fatigue 12/02/2020   Generalized anxiety disorder    since defibrillator placement   GERD (gastroesophageal reflux disease)    Hip pain 12/14/2016   History of COVID-19    History of myocardial infarction    Hyperlipidemia    Left knee pain 12/14/2016   Low back pain 05/14/2021   Malignant neoplasm of prostate 09/27/2014   s/p prostatectomy   Mild anemia 01/20/2011   Mixed dementia 04/27/2022   Neuralgia 10/19/2018   NICM (nonischemic cardiomyopathy)    a.normal cors by cath in 2005. b. s/p Medtronic ICD.   OSA (obstructive sleep apnea) 11/10/2012   regular CPAP use   Pain due to onychomycosis of toenails of both feet 02/23/2022   Pain in the coccyx 01/28/2022   Rectal pain 12/21/2021   Right foot pain 12/15/2017   RLS (restless legs syndrome) 11/29/2012   S/P radiation therapy    10/10/2014 through 11/26/2014; Prostate bed 6600 cGy in 33 sessions   Shingles 07/28/2016   TIA (transient ischemic attack) 01/05/2022   Type II diabetes mellitus 08/26/2009   Ventricular tachycardia    a. s/p ICD (generator change 09/2012). b. 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 and placed on sotalol  Visual hallucinations    Vitamin B12 deficiency 03/06/2011   Monthly B12 shots initiated  2011. Nasally  inhaled B12 as of 07/2012   Past Surgical History:  Procedure Laterality Date   CARDIAC DEFIBRILLATOR PLACEMENT  03/11/2004   Medtronic Maximo single lead   COLONOSCOPY W/ BIOPSIES AND POLYPECTOMY  08/2004, 02/24/11   6mm adenoma in 2006,  5 mm polyp (not recovered) 2012   CYSTOSCOPY WITH DIRECT VISION INTERNAL URETHROTOMY N/A 10/08/2015   Procedure: CYSTOSCOPY WITH DIRECT VISION  INTERNAL URETHROTOMY;  Surgeon: Hildred Laser, MD;  Location: [facility];  Service: Urology;  Laterality: N/A;   IMPLANTABLE CARDIOVERTER DEFIBRILLATOR GENERATOR CHANGE N/A 10/07/2012   Procedure: IMPLANTABLE CARDIOVERTER DEFIBRILLATOR GENERATOR CHANGE;  Surgeon: Marinus Maw, MD;  Location: [facility] C[facility] LAB;  Service: Cardiovascular;  Laterality: N/A;   PROSTATECTOMY  06/2003   Dr Brunilda Payor   RECTAL SURGERY     UPPER GASTROINTESTINAL ENDOSCOPY  08/2004, 02/24/11   Barrett's esophagus   Allergies Allergies  Allergen Reactions   Zantac [Ranitidine Hcl] Itching   Home Medications    Prior to Admission medications   Medication Sig Start Date End Date Taking? Authorizing Provider  ACCU-CHEK GUIDE test strip USE TO CHECK SUGAR 4 TIMES A DAY 06/09/21   Pincus Sanes, MD  acetaminophen (TYLENOL) 500 MG tablet Take 1,000 mg by mouth every 6 (six) hours as needed for mild pain.    [provider]  atorvastatin (LIPITOR) 20 MG tablet TAKE 1 TABLET DAILY EXCEPT TAKE 1/2 TABLET ON TUESDAY, THURSDAY, AND SATURDAYS 02/17/23   Pincus Sanes, MD  carvedilol (COREG) 25 MG tablet TAKE 1 TABLET TWICE DAILY WITH MEALS 08/19/22   Marinus Maw, MD  Cholecalciferol (VITAMIN D) 50 MCG (2000 UT) tablet Take 2,000 Units by mouth daily.    [provider]  clopidogrel (PLAVIX) 75 MG tablet TAKE 1 TABLET EVERY DAY 11/16/22   Marinus Maw, MD  cyanocobalamin (VITAMIN B12) 1000 MCG tablet TAKE 1 TABLET EVERY DAY 08/17/22   Pincus Sanes, MD  digoxin (LANOXIN) 0.125 MG tablet TAKE 1 TABLET EVERY DAY 10/19/22   Burns, Bobette Mo, MD  docusate sodium (COLACE) 100 MG capsule Take 1 capsule (100 mg total) by mouth 2 (two) times daily as needed for mild constipation. 05/30/21   Pincus Sanes, MD  donepezil (ARICEPT) 10 MG tablet Take 1 tablet (10 mg total) by mouth daily. 01/14/23   Pincus Sanes, MD  famotidine (PEPCID) 20 MG tablet Take 1 tablet (20 mg total) by mouth daily. 06/22/22   Pincus Sanes, MD  JANUMET  50-1000 MG tablet TAKE 1 TABLET EVERY DAY WITH A MEAL 03/15/23   Burns, Bobette Mo, MD  Lancets [facility] ULTRASOFT) lancets Use as instructed to test sugar daily and prn. Dx E11.9 01/16/17   Pincus Sanes, MD  losartan (COZAAR) 25 MG tablet TAKE 1 TABLET (25 MG TOTAL) BY MOUTH AT BEDTIME. 08/10/22   Marinus Maw, MD  pantoprazole (PROTONIX) 40 MG tablet TAKE 1 TABLET EVERY DAY 01/28/23   Pincus Sanes, MD  sotalol (BETAPACE) 120 MG tablet TAKE 1 TABLET TWICE DAILY 01/21/23   Pincus Sanes, MD    Physical Exam    Vital Signs:  JANEK GARDEN does not have vital signs available for review today. Given telephonic nature of communication, physical exam is limited. AAOx3. NAD. Normal affect.  Speech and respirations are unlabored.  Accessory Clinical Findings  None Assessment & Plan  1.  Preoperative Cardiovascular Risk Assessment: cystoscopy with urethral dilation with Dr. Marlou Porch. Mr. Thomassen's perioperative risk of a major cardiac event is 6.6% according to the Revised Cardiac Risk Index (RCRI).  Therefore, he is at high risk for perioperative complications.   His functional capacity is good at 6.61 METs according to the Duke Activity Status Index (DASI). Recommendations: According to ACC/AHA guidelines, no further cardiovascular testing needed.  The patient may proceed to surgery at acceptable risk.    From a cardiac standpoint patient may hold Plavix for five days please resume medications when safe to do so from a bleeding perspective. However, patient takes Plavix primarily for history of TIA. Therefor final recommendations for holding Plavix prior to procedure should come from managing provider (neurology).   The patient was advised that if he develops new symptoms prior to surgery to contact our office to arrange for a follow-up visit, and he verbalized understanding.  A copy of this note will be routed to requesting surgeon.  Time:   Today, I have spent 6 minutes with the patient with  telehealth technology discussing medical history, symptoms, and management plan.    Rip Harbour, NP  03/29/2023, 9:35 AM

## 2023-03-31 ENCOUNTER — Encounter: Payer: Self-pay | Admitting: Internal Medicine

## 2023-03-31 NOTE — Progress Notes (Signed)
PERIOPERATIVE PRESCRIPTION FOR IMPLANTED CARDIAC DEVICE PROGRAMMING  Patient Information: Name:  [name]  DOB:  03-13-1951  MRN:  [id number]    Planned Procedure:  Cystoscopy with optilume urethral dilation.  Surgeon:  Dr. Berniece Salines  Date of Procedure:  04/15/23  Cautery will be used.  Position during surgery:    Please send documentation back to:  [facility] (Fax # [phone])   Device Information:  Clinic EP Physician:  Lewayne Bunting, MD   Device Type:  Defibrillator Manufacturer and Phone #:  Medtronic: [id number] Pacemaker Dependent?:  No. Date of Last Device Check:  03/18/2023 Normal Device Function?:  Yes.    Electrophysiologist's Recommendations:  Have magnet available. Provide continuous ECG monitoring when magnet is used or reprogramming is to be performed.  Procedure should not interfere with device function.  No device programming or magnet placement needed.  Per Device Clinic Standing Orders, Lenor Coffin, RN  1:06 PM 03/31/2023

## 2023-03-31 NOTE — Patient Instructions (Signed)
DUE TO COVID-19 ONLY TWO VISITORS  (aged 71 and older)  ARE ALLOWED TO COME WITH YOU AND STAY IN THE WAITING ROOM ONLY DURING PRE OP AND PROCEDURE.   **NO VISITORS ARE ALLOWED IN THE SHORT STAY AREA OR RECOVERY ROOM!!**  IF YOU WILL BE ADMITTED INTO THE HOSPITAL YOU ARE ALLOWED ONLY FOUR SUPPORT PEOPLE DURING VISITATION HOURS ONLY (7 AM -8PM)   The support person(s) must pass our screening, gel in and out, and wear a mask at all times, including in the patient's room. Patients must also wear a mask when staff or their support person are in the room. Visitors GUEST BADGE MUST BE WORN VISIBLY  One adult visitor may remain with you overnight and MUST be in the room by 8 P.M.     Your procedure is scheduled on: 04/15/23   Report to [facility] Main Entrance    Report to admitting at : 5:15 AM   Call this number if you have problems the morning of surgery [phone]   Do not eat food or drink : After Midnight.  FOLLOW ANY ADDITIONAL PRE OP INSTRUCTIONS YOU RECEIVED FROM YOUR SURGEON'S OFFICE!!!   Oral Hygiene is also important to reduce your risk of infection.                                    Remember - BRUSH YOUR TEETH THE MORNING OF SURGERY WITH YOUR REGULAR TOOTHPASTE  DENTURES WILL BE REMOVED PRIOR TO SURGERY PLEASE DO NOT APPLY "Poly grip" OR ADHESIVES!!!   Do NOT smoke after Midnight   Take these medicines the morning of surgery with A SIP OF WATER: carvedilol,digoxin,donepezil(Aricept),famotidine,pantoprazole.Tylenol as needed.  DO NOT TAKE ANY ORAL DIABETIC MEDICATIONS DAY OF YOUR SURGERY                              You may not have any metal on your body including hair pins, jewelry, and body piercing             Do not wear lotions, powders, perfumes/cologne, or deodorant              Men may shave face and neck.   Do not bring valuables to the hospital. [place] IS NOT             RESPONSIBLE   FOR VALUABLES.   Contacts, glasses, or bridgework may not  be worn into surgery.   Bring small overnight bag day of surgery.   DO NOT BRING YOUR HOME MEDICATIONS TO THE HOSPITAL. PHARMACY WILL DISPENSE MEDICATIONS LISTED ON YOUR MEDICATION LIST TO YOU DURING YOUR ADMISSION IN THE HOSPITAL!    Patients discharged on the day of surgery will not be allowed to drive home.  Someone NEEDS to stay with you for the first 24 hours after anesthesia.   Special Instructions: Bring a copy of your healthcare power of attorney and living will documents         the day of surgery if you haven't scanned them before.              Please read over the following fact sheets you were given: IF YOU HAVE QUESTIONS ABOUT YOUR PRE-OP INSTRUCTIONS PLEASE CALL [phone]    [facility] - Preparing for Surgery Before surgery, you can play an important role.  Because skin is not sterile,  your skin needs to be as free of germs as possible.  You can reduce the number of germs on your skin by washing with CHG (chlorahexidine gluconate) soap before surgery.  CHG is an antiseptic cleaner which kills germs and bonds with the skin to continue killing germs even after washing. Please DO NOT use if you have an allergy to CHG or antibacterial soaps.  If your skin becomes reddened/irritated stop using the CHG and inform your nurse when you arrive at Short Stay. Do not shave (including legs and underarms) for at least 48 hours prior to the first CHG shower.  You may shave your face/neck. Please follow these instructions carefully:  1.  Shower with CHG Soap the night before surgery and the  morning of Surgery.  2.  If you choose to wash your hair, wash your hair first as usual with your  normal  shampoo.  3.  After you shampoo, rinse your hair and body thoroughly to remove the  shampoo.                           4.  Use CHG as you would any other liquid soap.  You can apply chg directly  to the skin and wash                       Gently with a scrungie or clean washcloth.  5.  Apply the CHG  Soap to your body ONLY FROM THE NECK DOWN.   Do not use on face/ open                           Wound or open sores. Avoid contact with eyes, ears mouth and genitals (private parts).                       Wash face,  Genitals (private parts) with your normal soap.             6.  Wash thoroughly, paying special attention to the area where your surgery  will be performed.  7.  Thoroughly rinse your body with warm water from the neck down.  8.  DO NOT shower/wash with your normal soap after using and rinsing off  the CHG Soap.                9.  Pat yourself dry with a clean towel.            10.  Wear clean pajamas.            11.  Place clean sheets on your bed the night of your first shower and do not  sleep with pets. Day of Surgery : Do not apply any lotions/deodorants the morning of surgery.  Please wear clean clothes to the hospital/surgery center.  FAILURE TO FOLLOW THESE INSTRUCTIONS MAY RESULT IN THE CANCELLATION OF YOUR SURGERY PATIENT SIGNATURE_________________________________  NURSE SIGNATURE__________________________________  ________________________________________________________________________

## 2023-03-31 NOTE — Progress Notes (Signed)
Remote ICD transmission.   

## 2023-04-01 ENCOUNTER — Other Ambulatory Visit: Payer: Self-pay

## 2023-04-01 ENCOUNTER — Encounter (HOSPITAL_COMMUNITY): Payer: Self-pay

## 2023-04-01 ENCOUNTER — Encounter (HOSPITAL_COMMUNITY)
Admission: RE | Admit: 2023-04-01 | Discharge: 2023-04-01 | Disposition: A | Discharge status: Home / Self Care | Payer: Medicare PPO | Source: Ambulatory Visit | Attending: Urology | Admitting: Urology

## 2023-04-01 VITALS — BP 141/84 | HR 69 | Temp 97.8°F | Ht 70.0 in | Wt 180.0 lb

## 2023-04-01 DIAGNOSIS — Z01818 Encounter for other preprocedural examination: Secondary | ICD-10-CM | POA: Insufficient documentation

## 2023-04-01 DIAGNOSIS — E1151 Type 2 diabetes mellitus with diabetic peripheral angiopathy without gangrene: Secondary | ICD-10-CM | POA: Insufficient documentation

## 2023-04-01 DIAGNOSIS — I10 Essential (primary) hypertension: Secondary | ICD-10-CM | POA: Insufficient documentation

## 2023-04-01 DIAGNOSIS — Z01812 Encounter for preprocedural laboratory examination: Secondary | ICD-10-CM | POA: Diagnosis present

## 2023-04-01 DIAGNOSIS — R9431 Abnormal electrocardiogram [ECG] [EKG]: Secondary | ICD-10-CM | POA: Diagnosis not present

## 2023-04-01 DIAGNOSIS — Z01810 Encounter for preprocedural cardiovascular examination: Secondary | ICD-10-CM | POA: Diagnosis present

## 2023-04-01 DIAGNOSIS — I472 Ventricular tachycardia, unspecified: Secondary | ICD-10-CM

## 2023-04-01 LAB — CBC
HCT: 36.3 % — ABNORMAL LOW (ref 39.0–52.0)
Hemoglobin: 11.6 g/dL — ABNORMAL LOW (ref 13.0–17.0)
MCH: 24.3 pg — ABNORMAL LOW (ref 26.0–34.0)
MCHC: 32 g/dL (ref 30.0–36.0)
MCV: 76.1 fL — ABNORMAL LOW (ref 80.0–100.0)
Platelets: 200 10*3/uL (ref 150–400)
RBC: 4.77 MIL/uL (ref 4.22–5.81)
RDW: 16.1 % — ABNORMAL HIGH (ref 11.5–15.5)
WBC: 4.8 10*3/uL (ref 4.0–10.5)
nRBC: 0 % (ref 0.0–0.2)

## 2023-04-01 LAB — BASIC METABOLIC PANEL
Anion gap: 8 (ref 5–15)
BUN: 12 mg/dL (ref 8–23)
CO2: 25 mmol/L (ref 22–32)
Calcium: 9 mg/dL (ref 8.9–10.3)
Chloride: 107 mmol/L (ref 98–111)
Creatinine, Ser: 0.97 mg/dL (ref 0.61–1.24)
GFR, Estimated: >60 mL/min (ref >60)
Glucose, Bld: 162 mg/dL — ABNORMAL HIGH (ref 70–99)
Potassium: 3.7 mmol/L (ref 3.5–5.1)
Sodium: 140 mmol/L (ref 135–145)

## 2023-04-01 LAB — GLUCOSE, CAPILLARY: Glucose-Capillary: 145 mg/dL — ABNORMAL HIGH (ref 70–99)

## 2023-04-01 NOTE — Progress Notes (Signed)
For Anesthesia: PCP - Pincus Sanes, MD  Cardiologist - Marinus Maw, MD  Clearance: Rudene Re: NP: 03/29/23 Pulmunologist: Dr. Cyril Mourning.  Bowel Prep reminder:  Chest x-ray -  EKG - 04/01/23 Stress Test -  ECHO - 01/06/22 Cardiac Cath -  Pacemaker/ICD device last checked: 03/18/23 Pacemaker orders received: EPIC/Chart Device Rep notified: N/A  Spinal Cord Stimulator: N/A  Sleep Study - Yes CPAP - Yes  Fasting Blood Sugar - N/A Checks Blood Sugar ___0__ times a day Date and result of last Hgb A1c- 6.1: 01/14/23  Last dose of GLP1 agonist- N/A GLP1 instructions:   Last dose of SGLT-2 inhibitors- N/A SGLT-2 instructions:   Blood Thinner Instructions: Plavix will be hold after: 04/09/23 Aspirin Instructions: Last Dose:  Activity level: Can go up a flight of stairs and activities of daily living without stopping and without chest pain and/or shortness of breath   Able to exercise without chest pain and/or shortness of breath  Anesthesia review: Hx: TIA,OSA(XCPAP),HTN,ICD,CHF  Patient denies shortness of breath, fever, cough and chest pain at PAT appointment   Patient verbalized understanding of instructions that were given to them at the PAT appointment. Patient was also instructed that they will need to review over the PAT instructions again at home before surgery.

## 2023-04-02 NOTE — Anesthesia Preprocedure Evaluation (Addendum)
Anesthesia Evaluation  Patient identified by MRN, date of birth, ID band Patient awake    Reviewed: Allergy & Precautions, NPO status , Patient's Chart, lab work & pertinent test results, reviewed documented beta blocker date and time   Airway Mallampati: II  TM Distance: >3 FB Neck ROM: Full    Dental  (+) Teeth Intact, Dental Advisory Given, Missing   Pulmonary sleep apnea    breath sounds clear to auscultation       Cardiovascular hypertension, Pt. on home beta blockers and Pt. on medications + dysrhythmias + Cardiac Defibrillator  Rhythm:Regular Rate:Normal     Neuro/Psych  PSYCHIATRIC DISORDERS Anxiety    Dementia TIA Neuromuscular disease    GI/Hepatic Neg liver ROS,GERD  Medicated,,  Endo/Other  diabetes, Type 2, Oral Hypoglycemic Agents    Renal/GU negative Renal ROS     Musculoskeletal  (+) Arthritis ,    Abdominal   Peds  Hematology negative hematology ROS (+)   Anesthesia Other Findings   Reproductive/Obstetrics                             Anesthesia Physical Anesthesia Plan  ASA: 3  Anesthesia Plan: General   Post-op Pain Management: Tylenol PO (pre-op)*   Induction: Intravenous  PONV Risk Score and Plan: 3 and Ondansetron, Dexamethasone and Treatment may vary due to age or medical condition  Airway Management Planned: LMA  Additional Equipment: None  Intra-op Plan:   Post-operative Plan: Extubation in OR  Informed Consent:   Plan Discussed with: CRNA  Anesthesia Plan Comments: (See PAT note from 11/21 by Sherlie Ban PA-C )        Anesthesia Quick Evaluation

## 2023-04-02 NOTE — Progress Notes (Signed)
DISCUSSION: [name] is a 72 yo male who presents to PAT prior to CYSTOSCOPY WITH OPTILUME URETHRAL DILATATION on 04/15/23 with Dr. Marlou Porch. PMH of HTN, aortic atherosclerosis, hx of MI, NICM s/p AICD placement (2005), V Tach, OSA (uses CPAP), hx of TIA, GERD, DM, prostate cancer s/p prostatectomy and XRT, mild cognitive impairment.  Patient follows with Cardiology for NICM, EF 25-30%, symptomatic VTach, status post AICD implantation, normal coronary arteries by catheterization 5/05, chronic systolic CHF. Last seen in clinic on 05/12/22. Noted to have class 2 NYHA symptoms and stable on current medication regimen. Had cardiac clearance visit on 03/29/23 and cleared for surgery:  "Preoperative Cardiovascular Risk Assessment: cystoscopy with urethral dilation with Dr. Marlou Porch. [name]'s perioperative risk of a major cardiac event is 6.6% according to the Revised Cardiac Risk Index (RCRI).  Therefore, he is at high risk for perioperative complications.   His functional capacity is good at 6.61 METs according to the Duke Activity Status Index (DASI). Recommendations: According to ACC/AHA guidelines, no further cardiovascular testing needed.  The patient may proceed to surgery at acceptable risk.     From a cardiac standpoint patient may hold Plavix for five days please resume medications when safe to do so from a bleeding perspective. However, patient takes Plavix primarily for history of TIA. Therefore final recommendations for holding Plavix prior to procedure should come from managing provider (neurology)."  Device orders in 03/31/23 progress note: Electrophysiologist's Recommendations:   Have magnet available. Provide continuous ECG monitoring when magnet is used or reprogramming is to be performed.  Procedure should not interfere with device function.  No device programming or magnet placement needed.  Patient follows with Neurology for cognitive impairment and mild chronic bilateral LE  radiculopathy. Last seen on 11/13/22. On Aricept  Follows with Pulmonology for OSA. Uses CPAP.  VS: BP (!) 141/84   Pulse 69   Temp 36.6 C (Oral)   Ht 5\' 10"  (1.778 m)   Wt 81.6 kg   SpO2 100%   BMI 25.83 kg/m   PROVIDERS: Pincus Sanes, MD Cardiology (EP): Lewayne Bunting, MD  LABS: Labs reviewed: Acceptable for surgery. (all labs ordered are listed, but only abnormal results are displayed)  Labs Reviewed  BASIC METABOLIC PANEL - Abnormal; Notable for the following components:      Result Value   Glucose, Bld 162 (*)    All other components within normal limits  CBC - Abnormal; Notable for the following components:   Hemoglobin 11.6 (*)    HCT 36.3 (*)    MCV 76.1 (*)    MCH 24.3 (*)    RDW 16.1 (*)    All other components within normal limits  GLUCOSE, CAPILLARY - Abnormal; Notable for the following components:   Glucose-Capillary 145 (*)    All other components within normal limits     IMAGES:   EKG 04/02/23  Normal sinus rhythm, rate  Moderate voltage criteria for LVH, may be normal variant ( R in aVL , Sokolow-Lyon ) Nonspecific T wave abnormality  CV: Device check 03/18/23:  Scheduled remote reviewed. Normal device function.   There was one short NSVT arrhythmia detected Next remote 91 days.  Holter monitor 02/18/2022: NSR with sinus bradycardia Frequent episodes of brief NSVT, of different PVC morphologies No prolonged pauses Brief NS SVT No atrial fib  Echo 01/06/22:  IMPRESSIONS    1. Left ventricular ejection fraction, by estimation, is 35 to 40%. The left ventricle has moderately decreased function. The left ventricle  demonstrates global hypokinesis. Left ventricular diastolic parameters are consistent with Grade I diastolic dysfunction (impaired relaxation).  2. Right ventricular systolic function is normal. The right ventricular size is normal. There is normal pulmonary artery systolic pressure.  3. The mitral valve is normal in  structure. No evidence of mitral valve regurgitation.  4. The aortic valve was not well visualized. Aortic valve regurgitation is not visualized.  5. The inferior vena cava is normal in size with greater than 50% respiratory variability, suggesting right atrial pressure of 3 mmHg.  Comparison(s): No significant change from prior study.   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   Not compatible with MRI    Barrett's esophagus 09/05/2004   2 cm changes seen at index EGD   Cataract    Chronic back pain 04/25/2019   Chronic systolic heart failure    Colon polyps    tubular adenoma   Diabetic neuropathy 01/14/2022   Difficulty in walking 10/02/2021   Dizziness 01/19/2011   Dysrhythmia    Essential hypertension 08/26/2009   Fatigue 12/02/2020   Generalized anxiety disorder    since defibrillator placement   GERD (gastroesophageal reflux disease)    Hip pain 12/14/2016   History of COVID-19    History of myocardial infarction    Hyperlipidemia    Left knee pain 12/14/2016   Low back pain 05/14/2021   Malignant neoplasm of prostate 09/27/2014   s/p prostatectomy   Mild anemia 01/20/2011   Mixed dementia 04/27/2022   Neuralgia 10/19/2018   NICM (nonischemic cardiomyopathy)    a.normal cors by cath in 2005. b. s/p Medtronic ICD.   OSA (obstructive sleep apnea) 11/10/2012   regular CPAP use   Pain due to onychomycosis of toenails of both feet 02/23/2022   Pain in the coccyx 01/28/2022   Rectal pain 12/21/2021   Right foot pain 12/15/2017   RLS (restless legs syndrome) 11/29/2012   S/P radiation therapy    10/10/2014 through 11/26/2014; Prostate bed 6600 cGy in 33 sessions   Shingles 07/28/2016   TIA (transient ischemic attack) 01/05/2022   Type II diabetes mellitus 08/26/2009   Ventricular tachycardia    a. s/p ICD (generator change 09/2012). b.  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 and placed on sotalol   Visual hallucinations    Vitamin B12 deficiency 03/06/2011   Monthly B12 shots initiated  2011. Nasally  inhaled B12 as of 07/2012    Past Surgical History:  Procedure Laterality Date   CARDIAC DEFIBRILLATOR PLACEMENT  03/11/2004   Medtronic Maximo single lead   CATARACT EXTRACTION     COLONOSCOPY W/ BIOPSIES AND POLYPECTOMY  08/2004, 02/24/11   6mm adenoma in 2006,  5 mm polyp (not recovered) 2012   CYSTOSCOPY WITH DIRECT VISION INTERNAL URETHROTOMY N/A 10/08/2015   Procedure: CYSTOSCOPY WITH DIRECT VISION INTERNAL URETHROTOMY;  Surgeon: Hildred Laser, MD;  Location: [facility];  Service: Urology;  Laterality: N/A;   IMPLANTABLE CARDIOVERTER DEFIBRILLATOR GENERATOR CHANGE N/A 10/07/2012   Procedure: IMPLANTABLE CARDIOVERTER DEFIBRILLATOR GENERATOR CHANGE;  Surgeon: Marinus Maw, MD;  Location: [facility] CATH LAB;  Service: Cardiovascular;  Laterality: N/A;   PROSTATECTOMY  06/12/2003   Dr Brunilda Payor   RECTAL SURGERY     UPPER GASTROINTESTINAL ENDOSCOPY  08/2004, 02/24/11   Barrett's esophagus    MEDICATIONS:  ACCU-CHEK GUIDE test strip   acetaminophen (TYLENOL) 500 MG tablet   atorvastatin (LIPITOR) 20  MG tablet   carvedilol (COREG) 25 MG tablet   Cholecalciferol (VITAMIN D) 50 MCG (2000 UT) tablet   clopidogrel (PLAVIX) 75 MG tablet   cyanocobalamin (VITAMIN B12) 1000 MCG tablet   digoxin (LANOXIN) 0.125 MG tablet   docusate sodium (COLACE) 100 MG capsule   donepezil (ARICEPT) 10 MG tablet   famotidine (PEPCID) 20 MG tablet   JANUMET 50-1000 MG tablet   Lancets (ONETOUCH ULTRASOFT) lancets   losartan (COZAAR) 25 MG tablet   NON FORMULARY   OVER THE COUNTER MEDICATION   pantoprazole (PROTONIX) 40 MG tablet   SODIUM FLUORIDE 5000 SENSITIVE 1.1-5 % GEL   sotalol (BETAPACE) 120 MG tablet   No current facility-administered medications for this encounter.   Marcille Blanco [facility] Short Stay/Anesthesiology [facility] Phone [phone] 04/02/2023 2:11 PM

## 2023-04-11 ENCOUNTER — Other Ambulatory Visit: Payer: Self-pay | Admitting: Internal Medicine

## 2023-04-15 ENCOUNTER — Ambulatory Visit (HOSPITAL_BASED_OUTPATIENT_CLINIC_OR_DEPARTMENT_OTHER): Payer: Medicare PPO | Admitting: Anesthesiology

## 2023-04-15 ENCOUNTER — Encounter (HOSPITAL_COMMUNITY): Payer: Self-pay | Admitting: Urology

## 2023-04-15 ENCOUNTER — Ambulatory Visit (HOSPITAL_COMMUNITY): Payer: Medicare PPO | Admitting: Medical

## 2023-04-15 ENCOUNTER — Encounter (HOSPITAL_COMMUNITY)
Admission: RE | Disposition: A | Discharge status: Home / Self Care | Payer: Self-pay | Source: Ambulatory Visit | Attending: Urology

## 2023-04-15 ENCOUNTER — Ambulatory Visit (HOSPITAL_COMMUNITY): Payer: Medicare PPO

## 2023-04-15 ENCOUNTER — Ambulatory Visit (HOSPITAL_COMMUNITY)
Admission: RE | Admit: 2023-04-15 | Discharge: 2023-04-15 | Disposition: A | Discharge status: Home / Self Care | Payer: Medicare PPO | Source: Ambulatory Visit | Attending: Urology | Admitting: Urology

## 2023-04-15 DIAGNOSIS — I110 Hypertensive heart disease with heart failure: Secondary | ICD-10-CM | POA: Diagnosis not present

## 2023-04-15 DIAGNOSIS — N35912 Unspecified bulbous urethral stricture, male: Secondary | ICD-10-CM

## 2023-04-15 DIAGNOSIS — R310 Gross hematuria: Secondary | ICD-10-CM | POA: Diagnosis not present

## 2023-04-15 DIAGNOSIS — E119 Type 2 diabetes mellitus without complications: Secondary | ICD-10-CM | POA: Diagnosis not present

## 2023-04-15 DIAGNOSIS — Z8546 Personal history of malignant neoplasm of prostate: Secondary | ICD-10-CM | POA: Diagnosis not present

## 2023-04-15 DIAGNOSIS — C61 Malignant neoplasm of prostate: Secondary | ICD-10-CM | POA: Diagnosis not present

## 2023-04-15 DIAGNOSIS — I5022 Chronic systolic (congestive) heart failure: Secondary | ICD-10-CM | POA: Diagnosis not present

## 2023-04-15 DIAGNOSIS — N35919 Unspecified urethral stricture, male, unspecified site: Secondary | ICD-10-CM | POA: Diagnosis not present

## 2023-04-15 HISTORY — PX: CYSTOSCOPY WITH URETHRAL DILATATION: SHX5125

## 2023-04-15 LAB — GLUCOSE, CAPILLARY
Glucose-Capillary: 101 mg/dL — ABNORMAL HIGH (ref 70–99)
Glucose-Capillary: 128 mg/dL — ABNORMAL HIGH (ref 70–99)

## 2023-04-15 SURGERY — CYSTOSCOPY, WITH URETHRAL DILATION
Anesthesia: General

## 2023-04-15 MED ORDER — LIDOCAINE HCL (CARDIAC) PF 100 MG/5ML IV SOSY
PREFILLED_SYRINGE | INTRAVENOUS | Status: DC | PRN
  Administered 2023-04-15: 80 mg via INTRAVENOUS

## 2023-04-15 MED ORDER — OXYCODONE HCL 5 MG PO TABS: 5.0000 mg | ORAL_TABLET | Freq: Once | ORAL | Status: DC | PRN

## 2023-04-15 MED ORDER — ACETAMINOPHEN 500 MG PO TABS: 1000.0000 mg | ORAL_TABLET | Freq: Once | ORAL | Status: DC

## 2023-04-15 MED ORDER — ORAL CARE MOUTH RINSE: 15.0000 mL | Freq: Once | OROMUCOSAL | Status: AC

## 2023-04-15 MED ORDER — FENTANYL CITRATE (PF) 100 MCG/2ML IJ SOLN
INTRAMUSCULAR | Status: AC
  Filled 2023-04-15: qty 2

## 2023-04-15 MED ORDER — FENTANYL CITRATE (PF) 100 MCG/2ML IJ SOLN
INTRAMUSCULAR | Status: DC | PRN
  Administered 2023-04-15 (×2): 50 ug via INTRAVENOUS

## 2023-04-15 MED ORDER — INSULIN ASPART 100 UNIT/ML IJ SOLN: 0.0000 [IU] | INTRAMUSCULAR | Status: DC | PRN

## 2023-04-15 MED ORDER — MIDAZOLAM HCL 2 MG/2ML IJ SOLN
INTRAMUSCULAR | Status: AC
Start: 2023-04-15 — End: ?
  Filled 2023-04-15: qty 2

## 2023-04-15 MED ORDER — DROPERIDOL 2.5 MG/ML IJ SOLN: 0.6250 mg | Freq: Once | INTRAMUSCULAR | Status: DC | PRN

## 2023-04-15 MED ORDER — ONDANSETRON HCL 4 MG/2ML IJ SOLN
INTRAMUSCULAR | Status: DC | PRN
  Administered 2023-04-15: 4 mg via INTRAVENOUS

## 2023-04-15 MED ORDER — OXYCODONE HCL 5 MG/5ML PO SOLN: 5.0000 mg | Freq: Once | ORAL | Status: DC | PRN

## 2023-04-15 MED ORDER — CHLORHEXIDINE GLUCONATE 0.12 % MT SOLN
15.0000 mL | Freq: Once | OROMUCOSAL | Status: AC
  Administered 2023-04-15: 15 mL via OROMUCOSAL

## 2023-04-15 MED ORDER — ACETAMINOPHEN 10 MG/ML IV SOLN: 1000.0000 mg | Freq: Once | INTRAVENOUS | Status: DC | PRN

## 2023-04-15 MED ORDER — EPHEDRINE 5 MG/ML INJ
INTRAVENOUS | Status: AC
Start: 2023-04-15 — End: ?
  Filled 2023-04-15: qty 5

## 2023-04-15 MED ORDER — LACTATED RINGERS IV SOLN: INTRAVENOUS | Status: DC

## 2023-04-15 MED ORDER — SODIUM CHLORIDE 0.9 % IV SOLN: INTRAVENOUS | Status: DC

## 2023-04-15 MED ORDER — ACETAMINOPHEN 160 MG/5ML PO SOLN: 325.0000 mg | ORAL | Status: DC | PRN

## 2023-04-15 MED ORDER — STERILE WATER FOR IRRIGATION IR SOLN
Status: DC | PRN
  Administered 2023-04-15: 3000 mL

## 2023-04-15 MED ORDER — IOHEXOL 300 MG/ML  SOLN
INTRAMUSCULAR | Status: DC | PRN
  Administered 2023-04-15: 50 mL via URETHRAL

## 2023-04-15 MED ORDER — CEFAZOLIN SODIUM-DEXTROSE 2-4 GM/100ML-% IV SOLN
2.0000 g | INTRAVENOUS | Status: AC
  Administered 2023-04-15: 2 g via INTRAVENOUS
  Filled 2023-04-15: qty 100

## 2023-04-15 MED ORDER — DEXAMETHASONE SODIUM PHOSPHATE 4 MG/ML IJ SOLN
INTRAMUSCULAR | Status: DC | PRN
  Administered 2023-04-15: 4 mg via INTRAVENOUS

## 2023-04-15 MED ORDER — FENTANYL CITRATE PF 50 MCG/ML IJ SOSY: 25.0000 ug | PREFILLED_SYRINGE | INTRAMUSCULAR | Status: DC | PRN

## 2023-04-15 MED ORDER — PROPOFOL 10 MG/ML IV BOLUS
INTRAVENOUS | Status: DC | PRN
  Administered 2023-04-15: 150 mg via INTRAVENOUS

## 2023-04-15 MED ORDER — ACETAMINOPHEN 325 MG PO TABS: 325.0000 mg | ORAL_TABLET | ORAL | Status: DC | PRN

## 2023-04-15 MED ORDER — EPHEDRINE SULFATE (PRESSORS) 50 MG/ML IJ SOLN
INTRAMUSCULAR | Status: DC | PRN
  Administered 2023-04-15: 10 mg via INTRAVENOUS
  Administered 2023-04-15 (×2): 7.5 mg via INTRAVENOUS

## 2023-04-15 SURGICAL SUPPLY — 26 items
BAG URINE DRAIN 2000ML AR STRL (UROLOGICAL SUPPLIES) ×2 IMPLANT
BALLN NEPHROSTOMY (BALLOONS)
BALLN OPTILUME DCB 30X5X75 (BALLOONS) ×1
BALLOON NEPHROSTOMY (BALLOONS) IMPLANT
BALLOON OPTILUME DCB 30X5X75 (BALLOONS) IMPLANT
CATH FOLEY 2W COUNCIL 20FR 5CC (CATHETERS) IMPLANT
CATH FOLEY 2W COUNCIL 5CC 16FR (CATHETERS) IMPLANT
CATH FOLEY 2WAY SLVR 5CC 14FR (CATHETERS) IMPLANT
CATH ROBINSON RED A/P 14FR (CATHETERS) ×2 IMPLANT
CATH URET 5FR 70CM CONE TIP (BALLOONS) IMPLANT
CATH URETL OPEN END 6FR 70 (CATHETERS) IMPLANT
CLOTH BEACON ORANGE TIMEOUT ST (SAFETY) ×2 IMPLANT
DEVICE INFLATION ATRION QL4015 (MISCELLANEOUS) IMPLANT
GLOVE BIO SURGEON STRL SZ7.5 (GLOVE) ×2 IMPLANT
GOWN STRL REUS W/ TWL LRG LVL3 (GOWN DISPOSABLE) ×4 IMPLANT
GOWN STRL REUS W/ TWL XL LVL3 (GOWN DISPOSABLE) ×2 IMPLANT
GUIDEWIRE ANG ZIPWIRE 038X150 (WIRE) IMPLANT
GUIDEWIRE STR DUAL SENSOR (WIRE) ×2 IMPLANT
HOLDER FOLEY CATH W/STRAP (MISCELLANEOUS) IMPLANT
KIT TURNOVER KIT A (KITS) IMPLANT
MANIFOLD NEPTUNE II (INSTRUMENTS) IMPLANT
NS IRRIG 1000ML POUR BTL (IV SOLUTION) IMPLANT
PACK CYSTO (CUSTOM PROCEDURE TRAY) ×2 IMPLANT
PENCIL SMOKE EVACUATOR (MISCELLANEOUS) IMPLANT
SYR 10ML LL (SYRINGE) IMPLANT
WATER STERILE IRR 3000ML UROMA (IV SOLUTION) ×2 IMPLANT

## 2023-04-15 NOTE — Anesthesia Procedure Notes (Signed)
Procedure Name: LMA Insertion Date/Time: 04/15/2023 7:51 AM  Performed by: Theodosia Quay, CRNAPre-anesthesia Checklist: Patient identified, Emergency Drugs available, Suction available, Patient being monitored and Timeout performed Patient Re-evaluated:Patient Re-evaluated prior to induction Oxygen Delivery Method: Circle system utilized Preoxygenation: Pre-oxygenation with 100% oxygen Induction Type: IV induction Ventilation: Mask ventilation without difficulty LMA: LMA inserted LMA Size: 5.0 Number of attempts: 1 Placement Confirmation: positive ETCO2 and breath sounds checked- equal and bilateral Tube secured with: Tape Dental Injury: Teeth and Oropharynx as per pre-operative assessment

## 2023-04-15 NOTE — Anesthesia Postprocedure Evaluation (Signed)
Anesthesia Post Note  Patient: [name]  Procedure(s) Performed: Bluford Kaufmann AND Dory Peru WITH OPTILUME URETHRAL DILATATION     Patient location during evaluation: PACU Anesthesia Type: General Level of consciousness: awake and alert Pain management: pain level controlled Vital Signs Assessment: post-procedure vital signs reviewed and stable Respiratory status: spontaneous breathing, nonlabored ventilation, respiratory function stable and patient connected to nasal cannula oxygen Cardiovascular status: blood pressure returned to baseline and stable Postop Assessment: no apparent nausea or vomiting Anesthetic complications: no  No notable events documented.  Last Vitals:  Vitals:   04/15/23 0900 04/15/23 0915  BP: (!) 174/86 (!) 165/91  Pulse: (!) 56 (!) 56  Resp: 18 18  Temp:  36.7 C  SpO2: 98% 99%    Last Pain:  Vitals:   04/15/23 0915  TempSrc:   PainSc: 0-No pain                 Shelton Silvas

## 2023-04-15 NOTE — Discharge Instructions (Addendum)
Removal of catheter Remove the foley catheter in 24 hours.  This can be done easily by cutting the side port of the catheter, which will allow the balloon to deflate.  You will see 1-2 teaspoons of clear water as the balloon deflates and then the catheter can be slid out without difficulty.        Cut here   Foley Catheter Care A soft, flexible tube (Foley catheter) may have been placed in your bladder to drain urine and fluid. Follow these instructions: Taking Care of the Catheter Keep the area where the catheter leaves your body clean.  Attach the catheter to the leg so there is no tension on the catheter.  Keep the drainage bag below the level of the bladder, but keep it OFF the floor.  Do not take long soaking baths. Your caregiver will give instructions about showering.  Wash your hands before touching ANYTHING related to the catheter or bag.  Using mild soap and warm water on a washcloth:  Clean the area closest to the catheter insertion site using a circular motion around the catheter.  Clean the catheter itself by wiping AWAY from the insertion site for several inches down the tube.  NEVER wipe upward as this could sweep bacteria up into the urethra (tube in your body that normally drains the bladder) and cause infection.  Place a small amount of sterile lubricant at the tip of the penis where the catheter is entering.  Taking Care of the Drainage Bags Two drainage bags may be taken home: a large overnight drainage bag, and a smaller leg bag which fits underneath clothing.  It is okay to wear the overnight bag at any time, but NEVER wear the smaller leg bag at night.  Keep the drainage bag well below the level of your bladder. This prevents backflow of urine into the bladder and allows the urine to drain freely.  Anchor the tubing to your leg to prevent pulling or tension on the catheter. Use tape or a leg strap provided by the hospital.  Empty the drainage bag when it is 1/2 to 3/4  full. Wash your hands before and after touching the bag.  Periodically check the tubing for kinks to make sure there is no pressure on the tubing which could restrict the flow of urine.  Changing the Drainage Bags Cleanse both ends of the clean bag with alcohol before changing.  Pinch off the rubber catheter to avoid urine spillage during the disconnection.  Disconnect the dirty bag and connect the clean one.  Empty the dirty bag carefully to avoid a urine spill.  Attach the new bag to the leg with tape or a leg strap.  Cleaning the Drainage Bags Whenever a drainage bag is disconnected, it must be cleaned quickly so it is ready for the next use.  Wash the bag in warm, soapy water.  Rinse the bag thoroughly with warm water.  Soak the bag for 30 minutes in a solution of white vinegar and water (1 cup vinegar to 1 quart warm water).  Rinse with warm water.  SEEK MEDICAL CARE IF:  You have chills or night sweats.  You are leaking around your catheter or have problems with your catheter. It is not uncommon to have sporadic leakage around your catheter as a result of bladder spasms. If the leakage stops, there is not much need for concern. If you are uncertain, call your caregiver.  You develop side effects that you think are   coming from your medicines.  SEEK IMMEDIATE MEDICAL CARE IF:  You are suddenly unable to urinate. Check to see if there are any kinks in the drainage tubing that may cause this. If you cannot find any kinks, call your caregiver immediately. This is an emergency.  You develop shortness of breath or chest pains.  Bleeding persists or clots develop in your urine.  You have a fever.  You develop pain in your back or over your lower belly (abdomen).  You develop pain or swelling in your legs.  Any problems you are having get worse rather than better.  MAKE SURE YOU:  Understand these instructions.  Will watch your condition.  Will get help right away if you are not doing well  or get worse.   

## 2023-04-15 NOTE — Op Note (Addendum)
Preoperative diagnosis:  Recurrent bulbar urethral stricture   Postoperative diagnosis:  Same   Procedure: Retrograde urethrogram with interpretation Cystourethroscopy Urethral stricture balloon dilation, OPTi lumen   Surgeon: Crist Fat, MD   Anesthesia: General   Complications: None   Intraoperative findings:     EBL: Minimal   Specimens: None   Indication: [name] is a  72 y.o.  patient with history of bulbar urethral stricture s/p DVIU 2017 now presenting for optilume dilation after recurrence of stricture..  After reviewing the management options for treatment, he elected to proceed with the above surgical procedure(s). We have discussed the potential benefits and risks of the procedure, side effects of the proposed treatment, the likelihood of the patient achieving the goals of the procedure, and any potential problems that might occur during the procedure or recuperation. Informed consent has been obtained.   Description of procedure:   The patient was taken to the operating room and general anesthesia was induced.  The patient was placed in the dorsal lithotomy position, prepped and draped in the usual sterile fashion, and preoperative antibiotics were administered. A preoperative time-out was performed.    A 14 French Foley catheter was inserted into the fossa navicularis and inflated with 3 cc of sterile water.  Then with the penis on stretch the C arm was rotated slightly and a retrograde urethrogram was performed with the above findings.  I then deflated the balloon and remove the catheter.  I subsequently passed the cystoscope through the patient's urethra and up to the stricture.  I then advanced a wire through the scope and across the stricture.  I confirmed its position on fluoroscopy.  I then removed the scope leaving the wire behind.  I then repassed the scope alongside the wire and passed the Optilume balloon dilator kit over the wire and across the  stricture under visual guidance.  I then inflated the balloon to 10 cc H2O and left it inflated for 5 minutes.  I then deflated the balloon and passed the cystoscope into the bladder over our wire. The bladder was surveyed, with no lesions, masses or tumors noted, but there was a small diverticulum noted at the posterior bladder neck. We then slowly removed our scope and passed a 14 French Foley catheter.  The urine was noted to efflux nice and clear.  The patient was subsequently awoken and returned to PACU stable condition.

## 2023-04-15 NOTE — H&P (Signed)
CC: f/u to monitor Prostate Cancer  HPI: [name] is a 72 year-old male established patient who is here for interval evaluation of his prostate cancer.  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 Continues to have obstructive symptoms.   Interval: Patient is here for follow-up. He was seen in 2022. Since that time, he has a weak stream and a splayed stream, but is able to empty his bladder reasonably well. He has no frequency during the day. He does get up 3 - 4 times at night. He denies any hematuria or dysuria. He has not had any trouble with urinary retention.   02/11/2023: Here for follow-up exam. Optilume dilation of urethral stricture as well as cystoscopic examination of the bladder recommended at time of last exam but patient deferred. He did have a CT hematuria protocol study which was benign. He's been having intermittent gross hematuria now for the past couple of weeks. Not associated with clot passage. Some occasional dysuria but not excessive. Weak stream, urgency described above continues.   The patient was last seen 2022.   He was diagnosed with prostate cancer in 06/29/2003. The patient was treated with RRP. The patient started/underwent treatemt on 06/2003. The patient's gleason score was: 3+3=6. Pretreatment PSA: 4.06.   The patient's most recent PSA was <0.015. This was drawn on approximately 09/08/2020.   He has biochemical recurrence of his prostate cancer. This has been treated with external beam radiation. He was noted to have recurrence of his cancer in 06/28/2014.   He does have problems with erections. His erections are not satisfactory for intercouse.   The patient is having urinary incontinence. He has had new back pain. The patient has developed frequency, urgency, and incontinence.     ALLERGIES: No Allergies    MEDICATIONS: Adult Aspirin 81 mg tablet, delayed release  Atorvastatin Calcium 20 mg tablet  Carvedilol 12.5 MG Oral Tablet Oral  Donepezil Hcl 10  mg tablet  Janumet TABS Oral  Losartan Potassium 25 mg tablet  Pantoprazole Sodium 40 mg tablet, delayed release  Ramipril 10 MG Oral Capsule Oral  Ranitidine Hcl  Sotalol 120 mg tablet Oral  Vitamin B12     GU PSH: Complex Uroflow - 2020 Cystoscopy VIU - 2017 Locm 300-399Mg /Ml Iodine,1Ml - 10/21/2022 Radical Prostatectomy - 2008       PSH Notes: Cystoscopy With Internal Urethrotomy, Direct Vision, Heart Surgery, Prostatect Retropubic Radical W/ Bilat Pelv Lymphadenectomy, Pacemaker Placement   NON-GU PSH: Visit Complexity (formerly GPC1X) - 09/14/2022     GU PMH: Microscopic hematuria - 10/21/2022, - 09/14/2022 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/14/2022, - 2022, - 2020 History of prostate cancer - 09/14/2022, - 2022, - 2020, Prostate Cancer, - 2014, Prostate Cancer, - 2014, Prostate Cancer, - 2014 Prostate Cancer - 09/14/2022, - 2020, - 2019, - 2019, - 2018, - 2017, - 2017, - 2017, Prostate cancer, - 2017 Urinary Frequency - 2020 Urethral Stricture, Unspec - 2019, - 2018, - 2017, - 2017, - 2017 Acute Cystitis/UTI - 2017 Nocturia, Nocturia - 2017 Postprocedural anterior bulbous urethral stricture, Postprocedural stricture of anterior urethra - 2017 Dysuria, Dysuria - 2017 Urinary Retention, Unspec, Urinary retention - 2016 Urinary Tract Inf, Unspec site, Urinary tract infection - 2015 ED due to arterial insufficiency, Erectile dysfunction due to arterial insufficiency - 2014 Polyuria, Polyuria - 2014      PMH Notes:  2006-12-29 11:21:02 - Note: Acute Myocardial Infarction   NON-GU PMH: Encounter for general adult medical examination without abnormal findings, Encounter for  preventive health examination - 2016/05/10 Personal history of other diseases of the circulatory system, History of hypertension - May 10, 2013    FAMILY HISTORY: Death In The Family Father - Runs In Family Family Health Status Number - Runs In Family No pertinent family history - Other   SOCIAL HISTORY: Marital Status:  Married Preferred Language: English; Ethnicity: Not Hispanic Or Latino; Race: Black or African American Current Smoking Status: Patient has never smoked.  Has never drank.  Does not drink caffeine.     Notes: Never A Smoker, Alcohol Use, Marital History - Currently Married, Caffeine Use, Tobacco Use   REVIEW OF SYSTEMS:    GU Review Male:   Patient reports trouble starting your stream, hard to postpone urination, get up at night to urinate, and leakage of urine. Patient denies penile pain, have to strain to urinate , stream starts and stops, erection problems, frequent urination, and burning/ pain with urination.  Gastrointestinal (Upper):   Patient denies nausea, vomiting, and indigestion/ heartburn.  Gastrointestinal (Lower):   Patient denies diarrhea and constipation.  Constitutional:   Patient denies fever, night sweats, weight loss, and fatigue.  Skin:   Patient denies skin rash/ lesion and itching.  Eyes:   Patient denies blurred vision and double vision.  Ears/ Nose/ Throat:   Patient denies sore throat and sinus problems.  Hematologic/Lymphatic:   Patient denies swollen glands and easy bruising.  Cardiovascular:   Patient denies leg swelling and chest pains.  Respiratory:   Patient denies cough and shortness of breath.  Endocrine:   Patient denies excessive thirst.  Musculoskeletal:   Patient denies back pain and joint pain.  Neurological:   Patient denies headaches and dizziness.  Psychologic:   Patient denies depression and anxiety.   VITAL SIGNS:      02/11/2023 08:29 AM  Weight 180 lb / 81.65 kg  Height 71 in / 180.34 cm  BP 117/74 mmHg  Heart Rate 71 /min  Temperature 97.3 F / 36.2 C  BMI 25.1 kg/m   MULTI-SYSTEM PHYSICAL EXAMINATION:    Constitutional: Well-nourished. No physical deformities. Normally developed. Good grooming.  Neck: Neck symmetrical, not swollen. Normal tracheal position.  Respiratory: Normal breath sounds. No labored breathing, no use of accessory  muscles.   Cardiovascular: Regular rate and rhythm. No murmur, no gallop. Normal temperature, normal extremity pulses, no swelling, no varicosities.   Skin: No paleness, no jaundice, no cyanosis. No lesion, no ulcer, no rash.  Neurologic / Psychiatric: Oriented to time, oriented to place, oriented to person. No depression, no anxiety, no agitation.  Gastrointestinal: No mass, no tenderness, no rigidity, non obese abdomen.  Musculoskeletal: Normal gait and station of head and neck.     Complexity of Data:  Source Of History:  Patient, Medical Record Summary  Lab Test Review:   PSA  Records Review:   Pathology Reports, Previous Doctor Records, Previous Hospital Records, Previous Patient Records  Urine Test Review:   Urinalysis  Urodynamics Review:   Review Bladder Scan  X-Ray Review: C.T. Hematuria: Reviewed Films. Reviewed Report.     09/14/22 05/30/20 06/01/18 05/05/17 07/15/16 04/14/16 01/15/16 10/24/15  PSA  Total PSA <0.015 ng/mL <0.015 ng/mL <0.015 ng/mL 0.031 ng/mL 0.052 ng/dl 4.098  1.19 ng/dl 1.47     PROCEDURES:          Visit Complexity - G2211          Urinalysis w/Scope Dipstick Dipstick Cont'd Micro  Color: Amber Bilirubin: Neg mg/dL WBC/hpf: 0 - 5/hpf  Appearance:  Cloudy Ketones: Neg mg/dL RBC/hpf: >96/EAV  Specific Gravity: 1.020 Blood: 3+ ery/uL Bacteria: Few (10-25/hpf)  pH: 5.5 Protein: 2+ mg/dL Cystals: NS (Not Seen)  Glucose: Neg mg/dL Urobilinogen: 1.0 mg/dL Casts: NS (Not Seen)    Nitrites: Neg Trichomonas: Not Present    Leukocyte Esterase: Trace leu/uL Mucous: Present      Epithelial Cells: 0 - 5/hpf      Yeast: NS (Not Seen)      Sperm: Not Present    Notes: qns to spin    ASSESSMENT:      ICD-10 Details  1 GU:   Gross hematuria - R31.0 Undiagnosed New Problem  2   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 He would benefit from previously recommended Optilume balloon dilation of his known urethral stricture as well as cystoscopic survey of the bladder. Risk versus benefits discussed in detail, all questions answered to the best my ability with understanding expressed by the patient and his wife. He elects to proceed. He will need clearance to come off Plavix. I will send a message to Dr. Marlou Porch to confirm plan of care moving forward.

## 2023-04-15 NOTE — Transfer of Care (Signed)
Immediate Anesthesia Transfer of Care Note  Patient: [name]  Procedure(s) Performed: Procedure(s) with comments: CYSTOSCOPY AND URETHRAGRAM WITH OPTILUME URETHRAL DILATATION (N/A) - 60 MINUTES  Patient Location: PACU  Anesthesia Type:General  Level of Consciousness:  sedated, patient cooperative and responds to stimulation  Airway & Oxygen Therapy:Patient Spontanous Breathing and Patient connected to face mask oxgen  Post-op Assessment:  Report given to PACU RN and Post -op Vital signs reviewed and stable  Post vital signs:  Reviewed and stable  Last Vitals:  Vitals:   04/15/23 0606 04/15/23 0824  BP: (!) 166/78 (!) 164/89  Pulse: (!) 58 (!) 55  Resp: 18 15  Temp: 36.6 C 36.5 C  SpO2: 100% 100%    Complications: No apparent anesthesia complications

## 2023-04-16 ENCOUNTER — Encounter (HOSPITAL_COMMUNITY): Payer: Self-pay | Admitting: Urology

## 2023-04-20 ENCOUNTER — Ambulatory Visit: Payer: Medicare PPO | Attending: Internal Medicine | Admitting: Internal Medicine

## 2023-04-20 ENCOUNTER — Encounter: Payer: Self-pay | Admitting: Internal Medicine

## 2023-04-20 VITALS — BP 182/88 | HR 60 | Ht 69.0 in | Wt 185.0 lb

## 2023-04-20 DIAGNOSIS — I472 Ventricular tachycardia, unspecified: Secondary | ICD-10-CM | POA: Diagnosis not present

## 2023-04-20 LAB — CUP PACEART INCLINIC DEVICE CHECK
Battery Remaining Longevity: 20 mo
Battery Voltage: 2.92 V
Brady Statistic RV Percent Paced: 0.03 %
Date Time Interrogation Session: 20241210161825
HighPow Impedance: 61 Ohm
Implantable Lead Connection Status: 753985
Implantable Lead Implant Date: 20140530
Implantable Lead Location: 753860
Implantable Lead Model: 6935
Implantable Pulse Generator Implant Date: 20140530
Lead Channel Impedance Value: 399 Ohm
Lead Channel Impedance Value: 475 Ohm
Lead Channel Pacing Threshold Amplitude: 0.625 V
Lead Channel Pacing Threshold Pulse Width: 0.4 ms
Lead Channel Sensing Intrinsic Amplitude: 5 mV
Lead Channel Sensing Intrinsic Amplitude: 7.625 mV
Lead Channel Setting Pacing Amplitude: 2.5 V
Lead Channel Setting Pacing Pulse Width: 0.4 ms
Lead Channel Setting Sensing Sensitivity: 0.3 mV

## 2023-04-20 NOTE — Progress Notes (Signed)
HPI [name] returns today for ongoing followup. He is a pleasant 72 yo man with chronic systolic heart failure, s/p ICD insertion, h/o VT, and HTN. He feels well. He has class 2 CHF symptoms. No ICD shocks. He has retired from work.  In the interim he notes no chest pain or sob. He has been busy helping his daughter who also has a CM take care of their granddaughter.  Allergies  Allergen Reactions   Zantac [Ranitidine Hcl] Itching     Current Outpatient Medications  Medication Sig Dispense Refill   ACCU-CHEK GUIDE test strip USE TO CHECK SUGAR 4 TIMES A DAY 100 strip 4   acetaminophen (TYLENOL) 500 MG tablet Take 1,000 mg by mouth every 6 (six) hours as needed for mild pain.     atorvastatin (LIPITOR) 20 MG tablet TAKE 1 TABLET DAILY EXCEPT TAKE 1/2 TABLET ON TUESDAY, THURSDAY, AND SATURDAYS 72 tablet 3   carvedilol (COREG) 25 MG tablet TAKE 1 TABLET TWICE DAILY WITH MEALS 180 tablet 0   Cholecalciferol (VITAMIN D) 50 MCG (2000 UT) tablet Take 2,000 Units by mouth daily.     clopidogrel (PLAVIX) 75 MG tablet TAKE 1 TABLET EVERY DAY 90 tablet 3   cyanocobalamin (VITAMIN B12) 1000 MCG tablet TAKE 1 TABLET EVERY DAY (Patient taking differently: Take 1,000 mcg by mouth 3 (three) times a week.) 90 tablet 3   digoxin (LANOXIN) 0.125 MG tablet TAKE 1 TABLET EVERY DAY (Patient taking differently: Take 0.0625 mg by mouth daily.) 90 tablet 3   docusate sodium (COLACE) 100 MG capsule Take 1 capsule (100 mg total) by mouth 2 (two) times daily as needed for mild constipation. 60 capsule 2   donepezil (ARICEPT) 10 MG tablet Take 1 tablet (10 mg total) by mouth daily.     famotidine (PEPCID) 20 MG tablet TAKE 1 TABLET EVERY DAY 90 tablet 3   JANUMET 50-1000 MG tablet TAKE 1 TABLET EVERY DAY WITH A MEAL 90 tablet 3   Lancets (ONETOUCH ULTRASOFT) lancets Use as instructed to test sugar daily and prn. Dx E11.9 100 each 12   losartan (COZAAR) 25 MG tablet TAKE 1 TABLET (25 MG TOTAL) BY MOUTH AT  BEDTIME. 90 tablet 3   NON FORMULARY Pt uses a cpap nightly     OVER THE COUNTER MEDICATION Apply 1 Application topically as needed (pain). Nervine Cream     pantoprazole (PROTONIX) 40 MG tablet TAKE 1 TABLET EVERY DAY 90 tablet 3   SODIUM FLUORIDE 5000 SENSITIVE 1.1-5 % GEL Place 1 Application onto teeth daily.     sotalol (BETAPACE) 120 MG tablet TAKE 1 TABLET TWICE DAILY 180 tablet 3   No current facility-administered medications for this visit.     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   Not compatible with MRI    Barrett's esophagus 09/05/2004   2 cm changes seen at index EGD   Cataract    Chronic back pain 04/25/2019   Chronic systolic heart failure    Colon polyps    tubular adenoma   Diabetic neuropathy 01/14/2022   Difficulty in walking 10/02/2021   Dizziness 01/19/2011   Dysrhythmia    Essential hypertension 08/26/2009   Fatigue 12/02/2020   Generalized anxiety disorder    since defibrillator placement   GERD (gastroesophageal reflux disease)    Hip pain 12/14/2016   History of  COVID-19    History of myocardial infarction    Hyperlipidemia    Left knee pain 12/14/2016   Low back pain 05/14/2021   Malignant neoplasm of prostate 09/27/2014   s/p prostatectomy   Mild anemia 01/20/2011   Mixed dementia 04/27/2022   Neuralgia 10/19/2018   NICM (nonischemic cardiomyopathy)    a.normal cors by cath in 2005. b. s/p Medtronic ICD.   OSA (obstructive sleep apnea) 11/10/2012   regular CPAP use   Pain due to onychomycosis of toenails of both feet 02/23/2022   Pain in the coccyx 01/28/2022   Rectal pain 12/21/2021   Right foot pain 12/15/2017   RLS (restless legs syndrome) 11/29/2012   S/P radiation therapy    10/10/2014 through 11/26/2014; Prostate bed 6600 cGy in 33 sessions   Shingles 07/28/2016   TIA (transient ischemic attack)  01/05/2022   Type II diabetes mellitus 08/26/2009   Ventricular tachycardia    a. s/p ICD (generator change 09/2012). b. 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 and placed on sotalol   Visual hallucinations    Vitamin B12 deficiency 03/06/2011   Monthly B12 shots initiated  2011. Nasally  inhaled B12 as of 07/2012    ROS:   All systems reviewed and negative except as noted in the HPI.   Past Surgical History:  Procedure Laterality Date   CARDIAC DEFIBRILLATOR PLACEMENT  03/11/2004   Medtronic Maximo single lead   CATARACT EXTRACTION     COLONOSCOPY W/ BIOPSIES AND POLYPECTOMY  08/2004, 02/24/11   6mm adenoma in 2006,  5 mm polyp (not recovered) 2012   CYSTOSCOPY WITH DIRECT VISION INTERNAL URETHROTOMY N/A 10/08/2015   Procedure: CYSTOSCOPY WITH DIRECT VISION INTERNAL URETHROTOMY;  Surgeon: Hildred Laser, MD;  Location: [facility];  Service: Urology;  Laterality: N/A;   CYSTOSCOPY WITH URETHRAL DILATATION N/A 04/15/2023   Procedure: CYSTOSCOPY AND Dory [place] WITH OPTILUME URETHRAL DILATATION;  Surgeon: Crist Fat, MD;  Location: [facility];  Service: Urology;  Laterality: N/A;  60 MINUTES   IMPLANTABLE CARDIOVERTER DEFIBRILLATOR GENERATOR CHANGE N/A 10/07/2012   Procedure: IMPLANTABLE CARDIOVERTER DEFIBRILLATOR GENERATOR CHANGE;  Surgeon: Marinus Maw, MD;  Location[facility];  Service: Cardiovascular;  Laterality: N/A;   PROSTATECTOMY  06/12/2003   Dr Brunilda Payor   RECTAL SURGERY     UPPER GASTROINTESTINAL ENDOSCOPY  08/2004, 02/24/11   Barrett's esophagus     Family History  Problem Relation Age of Onset   Dementia Mother    Hypertension Mother    Coronary artery disease Father    Prostate cancer Father    Diabetes Father    Dementia Sister    Dementia Brother    Diabetes Paternal Grandmother    Dementia Maternal Aunt    Stroke Neg Hx    Colon cancer Neg Hx    Colon polyps Neg Hx    Esophageal cancer Neg Hx    Rectal cancer Neg Hx    Stomach cancer Neg Hx      Social History    Socioeconomic History   Marital status: Married    Spouse name: Not on file   Number of children: 2   Years of education: 16   Highest education level: Bachelor's degree (e.g., BA, AB, BS)  Occupational History   Occupation: Retired    Comment: Secretary/administrator  Tobacco Use   Smoking status: Never   Smokeless tobacco: Never  Vaping Use   Vaping status: Never Used  Substance and Sexual Activity   Alcohol use: No  Drug use: No   Sexual activity: Not on file  Other Topics Concern   Not on file  Social History Narrative   Right handed   No caffeine   Two story home   Retired   Lives with wife   Social Determinants of Health   Financial Resource Strain: Low Risk  (02/08/2023)   Overall Financial Resource Strain (CARDIA)    Difficulty of Paying Living Expenses: Not hard at all  Food Insecurity: No Food Insecurity (02/08/2023)   Hunger Vital Sign    Worried About Running Out of Food in the Last Year: Never true    Ran Out of Food in the Last Year: Never true  Transportation Needs: No Transportation Needs (02/08/2023)   PRAPARE - Administrator, Civil Service (Medical): No    Lack of Transportation (Non-Medical): No  Physical Activity: Inactive (02/08/2023)   Exercise Vital Sign    Days of Exercise per Week: 0 days    Minutes of Exercise per Session: 60 min  Stress: No Stress Concern Present (02/08/2023)   [employer] of Occupational Health - Occupational Stress Questionnaire    Feeling of Stress : Not at all  Social Connections: Socially Integrated (02/08/2023)   Social Connection and Isolation Panel [NHANES]    Frequency of Communication with Friends and Family: More than three times a week    Frequency of Social Gatherings with Friends and Family: More than three times a week    Attends Religious Services: More than 4 times per year    Active Member of [employer] or Organizations: No    Attends Engineer, structural: More than 4 times per  year    Marital Status: Married  Catering manager Violence: Not At Risk (11/05/2022)   Humiliation, Afraid, Rape, and Kick questionnaire    Fear of Current or Ex-Partner: No    Emotionally Abused: No    Physically Abused: No    Sexually Abused: No     BP (!) 182/88   Pulse 60   Ht 5\' 9"  (1.753 m)   Wt 185 lb (83.9 kg)   SpO2 98%   BMI 27.32 kg/m   Physical Exam:  Well appearing NAD HEENT: Unremarkable Neck:  No JVD, no thyromegally Lymphatics:  No adenopathy Back:  No CVA tenderness Lungs:  Clear HEART:  Regular rate rhythm, no murmurs, no rubs, no clicks Abd:  soft, positive bowel sounds, no organomegally, no rebound, no guarding Ext:  2 plus pulses, no edema, no cyanosis, no clubbing Skin:  No rashes no nodules Neuro:  CN II through XII intact, motor grossly intact  EKG - nsr  DEVICE  Normal device function.  See PaceArt for details.   Assess/Plan:  1. Chronic systolic heart failure - his symptoms are class 2. He will continue his current meds. He has been sedentary and I asked him to walk everyday. 2. ICD - his medtronic single chamber ICD is working normally. 3. VT - he has had no sustained episodes. He will undergo watchful waiting. He will continue his sotalol. 4.  Memory problems - he is on aricept. I asked him to hold the atorvastatin for a month. If his memory improves, stay off the statin.   Jesse Gowda Aviona Martenson,MD

## 2023-04-20 NOTE — Patient Instructions (Signed)

## 2023-04-23 DIAGNOSIS — N35011 Post-traumatic bulbous urethral stricture: Secondary | ICD-10-CM | POA: Diagnosis not present

## 2023-04-23 DIAGNOSIS — C61 Malignant neoplasm of prostate: Secondary | ICD-10-CM | POA: Diagnosis not present

## 2023-04-23 DIAGNOSIS — R310 Gross hematuria: Secondary | ICD-10-CM | POA: Diagnosis not present

## 2023-04-28 DIAGNOSIS — H04123 Dry eye syndrome of bilateral lacrimal glands: Secondary | ICD-10-CM | POA: Diagnosis not present

## 2023-04-28 DIAGNOSIS — H40013 Open angle with borderline findings, low risk, bilateral: Secondary | ICD-10-CM | POA: Diagnosis not present

## 2023-04-28 DIAGNOSIS — Z961 Presence of intraocular lens: Secondary | ICD-10-CM | POA: Diagnosis not present

## 2023-04-28 DIAGNOSIS — E119 Type 2 diabetes mellitus without complications: Secondary | ICD-10-CM | POA: Diagnosis not present

## 2023-04-28 DIAGNOSIS — H2511 Age-related nuclear cataract, right eye: Secondary | ICD-10-CM | POA: Diagnosis not present

## 2023-04-28 DIAGNOSIS — H43812 Vitreous degeneration, left eye: Secondary | ICD-10-CM | POA: Diagnosis not present

## 2023-04-28 DIAGNOSIS — H26492 Other secondary cataract, left eye: Secondary | ICD-10-CM | POA: Diagnosis not present

## 2023-04-28 LAB — HM DIABETES EYE EXAM

## 2023-05-04 NOTE — Telephone Encounter (Signed)
Results abstracted and care team updated.

## 2023-05-07 DIAGNOSIS — Z1152 Encounter for screening for COVID-19: Secondary | ICD-10-CM | POA: Diagnosis not present

## 2023-05-07 DIAGNOSIS — U071 COVID-19: Secondary | ICD-10-CM | POA: Diagnosis not present

## 2023-05-14 ENCOUNTER — Encounter: Payer: Medicare PPO | Admitting: Psychology

## 2023-05-19 ENCOUNTER — Encounter (HOSPITAL_BASED_OUTPATIENT_CLINIC_OR_DEPARTMENT_OTHER): Payer: Self-pay | Admitting: Pulmonary Disease

## 2023-05-19 ENCOUNTER — Ambulatory Visit (HOSPITAL_BASED_OUTPATIENT_CLINIC_OR_DEPARTMENT_OTHER): Payer: Medicare PPO | Admitting: Pulmonary Disease

## 2023-05-19 VITALS — BP 138/82 | HR 65 | Ht 69.0 in | Wt 178.0 lb

## 2023-05-19 DIAGNOSIS — G4733 Obstructive sleep apnea (adult) (pediatric): Secondary | ICD-10-CM

## 2023-05-19 NOTE — Patient Instructions (Signed)
CPAP is working well

## 2023-05-19 NOTE — Progress Notes (Signed)
 Subjective:    Patient ID: [name], male    DOB: Jun 11, 1950, 73 y.o.   MRN: [id number]  HPI  73 yo for follow-up of OSA and restless legs   PMH -chronic systolic heart failure status post AICD for VT,  hypertension,  diabetes type 2,  prostate cancer Restless leg syndrome Vascular dementia   OSA was diagnosed in 2005 but he could not tolerate CPAP.  We reinitiated CPAP in 2023 after split-night study He lost 40 pounds since his COVID infection in 2022    Discussed the use of AI scribe software for clinical note transcription with the patient, who gave verbal consent to proceed.  History of Present Illness   The patient, with a history of sleep apnea and restless leg syndrome, presents for a regular follow-up visit. The patient reports a new symptom of itching and burning in the feet, which he manages by rubbing his feet together. The patient's partner reports that this behavior occurs throughout the night and has led to skin abrasion. The patient was previously on gabapentin , but it was discontinued due to dizziness when combined with losartan .  The patient's sleep apnea is well-managed with a CPAP machine, which he uses consistently. The patient has unintentionally lost weight, which he attributes to a decreased appetite and frequent diarrhea. The patient's partner reports that the patient often avoids eating to prevent having to use the bathroom. The patient is also on medication for memory issues, which he reports are not as effective as he would like. The patient practices recalling personal information such as his address and social security number, but still struggles with recall.      CPAP DL shows good control of events on avg 9 cm with auto 5-10 cm, mod leak & occas ional residual events  Significant tests/ events reviewed   08/2021 split-night study mild OSA AHI 9.5/hour, RDI 50/hour, minimum desaturation 90%  01/2004 NPSG - wt 235 lbs -RDI 37/hour corrected by CPAP 7  cm  Review of Systems neg for any significant sore throat, dysphagia, itching, sneezing, nasal congestion or excess/ purulent secretions, fever, chills, sweats, unintended wt loss, pleuritic or exertional cp, hempoptysis, orthopnea pnd or change in chronic leg swelling. Also denies presyncope, palpitations, heartburn, abdominal pain, nausea, vomiting, diarrhea or change in bowel or urinary habits, dysuria,hematuria, rash, arthralgias, visual complaints, headache, numbness weakness or ataxia.     Objective:   Physical Exam  Gen. Pleasant, well-nourished, in no distress ENT - no thrush, no pallor/icterus,no post nasal drip Neck: No JVD, no thyromegaly, no carotid bruits Lungs: no use of accessory muscles, no dullness to percussion, clear without rales or rhonchi  Cardiovascular: Rhythm regular, heart sounds  normal, no murmurs or gallops, no peripheral edema Musculoskeletal: No deformities, no cyanosis or clubbing        Assessment & Plan:    Assessment and Plan    Obstructive Sleep Apnea Obstructive sleep apnea is well-controlled with CPAP therapy. The patient has adjusted well to the CPAP machine, with consistent usage and good compliance. The CPAP report shows a low number of events (<5) and appropriate pressure settings (5-10, maintaining ~9). Occasional mask leakage is not causing significant issues. Weight loss noted, but current management remains effective. Discussed that sleep apnea can cause memory issues, but this is unlikely given the well-controlled status. - Continue CPAP therapy with current settings - Monitor for mask fit issues and replace as needed - Follow up in one year for reassessment  Restless Legs  Syndrome Intermittent symptoms of restless legs syndrome, including rubbing toes raw and experiencing itching and burning sensations on feet. Managed with Nervine cream and Band-Aids. Previously used gabapentin  but discontinued due to dizziness with losartan . --  Continue using Nervine cream and Band-Aids as needed  Memory Impairment Reports memory issues, particularly with recent events. Practices recalling personal information for neurology appointments. Currently on medication for memory, but dosage reduced due to severe diarrhea. Neurology appointment scheduled for further evaluation. - Follow up with neurology for further assessment and management - Monitor for changes in memory function and report to primary care physician  Diarrhea Frequent diarrhea, possibly related to memory medication. Dosage reduced due to severe diarrhea. Various foods can trigger diarrhea, leading to decreased appetite and weight loss. - Discuss diarrhea and potential dietary triggers with primary care physician - Monitor for changes in bowel habits and report to primary care physician  General Health Maintenance Unintentional weight loss and decreased appetite. Colonoscopy and other screenings completed. Continue monitoring overall health and discuss concerns with primary care physician. - Continue regular follow-ups with primary care physician - Maintain a balanced diet and monitor weight changes  Follow-up - Follow up with neurology as scheduled - Return for annual follow-up with pulmonology.     Consider repeat HST next year

## 2023-05-20 ENCOUNTER — Encounter: Payer: Self-pay | Admitting: Psychology

## 2023-05-20 ENCOUNTER — Encounter: Payer: Medicare PPO | Admitting: Psychology

## 2023-05-20 ENCOUNTER — Ambulatory Visit: Payer: Medicare PPO | Admitting: Psychology

## 2023-05-20 ENCOUNTER — Institutional Professional Consult (permissible substitution): Payer: Medicare PPO | Admitting: Psychology

## 2023-05-20 DIAGNOSIS — R4189 Other symptoms and signs involving cognitive functions and awareness: Secondary | ICD-10-CM

## 2023-05-20 DIAGNOSIS — F0280 Dementia in other diseases classified elsewhere without behavioral disturbance: Secondary | ICD-10-CM | POA: Diagnosis not present

## 2023-05-20 DIAGNOSIS — G309 Alzheimer's disease, unspecified: Secondary | ICD-10-CM

## 2023-05-20 DIAGNOSIS — F0150 Vascular dementia without behavioral disturbance: Secondary | ICD-10-CM

## 2023-05-20 NOTE — Progress Notes (Signed)
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  Instances of acting out his dreams: Denied outside what is described above.  Psychiatric/Behavioral Health History: Depression: Denied. He described his current mood as good and denied to his knowledge any prior mental health concerns or diagnoses. Current or remote suicidal ideation, intent, or plan was denied.  Anxiety: Denied. However, medical records do suggest a history of generalized anxiety disorder. He alluded to potential anxiety symptoms when being in larger crowds.  Mania: Denied. Trauma History: Denied. Visual/auditory hallucinations: Endorsed. Previous examples may have represented illusions rather than actual hallucinations. For example, he had provided an example surrounding him looking at a fire hydrant but seeing a dog. However, when asked during his 2023 evaluation, he reported fairly frequent instances where he will see fully-formed animals and people in his environment. Currently, his wife noted that he has described seeing people in his environment. These experiences were said to always occur at night, often while in bed. Mr. Borquez did not recall these examples and suggested that these experiences had not recurred  lately.  Delusional thoughts: Denied.   Tobacco: Denied. Alcohol: He denied current alcohol consumption as well as a history of problematic alcohol abuse or dependence.  Recreational drugs: Denied.  Family History: Problem Relation Age of Onset   Dementia Mother    Hypertension Mother    Coronary artery disease Father    Prostate cancer Father    Diabetes Father    Dementia Sister    Dementia Brother    Diabetes Paternal Grandmother    Dementia Maternal Aunt    Stroke Neg Hx    Colon cancer Neg Hx    Colon polyps Neg Hx    Esophageal cancer Neg Hx    Rectal cancer Neg Hx    Stomach cancer Neg Hx    This information was confirmed by Mr. Chappuis.  Academic/Vocational History: Highest level of educational attainment: 16 years. He graduated from high school and earned a Oncologist in clinical biochemist. He described himself as a good (mostly B) student in academic settings. English and writing composition were said to represent likely relative weaknesses in earlier academic settings.  History of developmental delay: Denied. History of grade repetition: Denied. Enrollment in special education courses: Denied. History of LD/ADHD: Denied.   Employment: Retired. 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  Evaluation Results:   Behavioral Observations: Mr. Yero was accompanied by his wife, arrived to his appointment on time, and was appropriately dressed and groomed. He appeared alert. Observed gait and station were within normal limits. Gross motor functioning appeared intact upon informal observation and no abnormal movements (e.g., tremors) were noted. His affect was generally relaxed and positive. Thought processes were coherent, organized, and normal in content. Insight into his cognitive difficulties appeared limited and I fear  that Mr. Dingley does not have a full appreciation for the severity of ongoing cognitive impairment.  During testing, sustained attention was  appropriate. Task engagement was adequate and he persisted when challenged. Task instructions had to be repeated at times, primarily due to rapid forgetting. There were also some comprehension difficulties across more complex tasks. Overall, Mr. Hyser was cooperative with the clinical interview and subsequent testing procedures.   Adequacy of Effort: The validity of neuropsychological testing is limited by the extent to which the individual being tested may be assumed to have exerted adequate effort during testing. Mr. Borquez expressed his intention to perform to the best of his abilities and exhibited adequate task engagement and persistence. Scores across stand-alone and embedded performance validity measures were variable. However, his sole below expectation performance was a single point below clinical cut-offs and more likely reflects true and quite severe cognitive impairment rather than poor engagement or attempts to perform poorly. As such, the results of the current evaluation are believed to be a valid representation of Mr. Meriweather's current cognitive functioning.  Test Results: Mr. Hartwell was oriented at the time of the current evaluation. He was unable to name the current clinic.  Intellectual abilities based upon educational and vocational attainment were estimated to be in the average range. Premorbid abilities were estimated to be within the below average range based upon a single-word reading test.   Processing speed was exceptionally low to well below average. Basic attention was average. More complex attention (e.g., working memory) was exceptionally low. Executive functioning was exceptionally low to well below average. He performed in the average range across a task assessing safety and judgment.   Assessed receptive language abilities were exceptionally low. Primary difficulties were exhibited sequencing commands, following multi-step commands, and understanding more complex sentence  structure. Across testing, he did appear to have trouble comprehending instructions across more complex tasks. However, some of this may have been due to him quickly forgetting task instructions. Assessed expressive language was variable. Phonemic fluency was below average to average, semantic fluency was below average, and confrontation naming was well below average.   Assessed visuospatial/visuoconstructional abilities were exceptionally low outside of an adequate copy of a complex figure. Across his drawing of a clock, significant numerical spacing abnormalities were observed. He was also only able to draw a single clock hand pointing straight upwards.    Learning (i.e., encoding) of novel verbal and visual information was exceptionally low to well below average. Spontaneous delayed recall (i.e., retrieval) of previously learned information was below average across a shape learning task but exceptionally low to well below average across all other tasks. Retention rates were 0% across a list learning task, 0% across a story learning task, 0% across a daily living task, and 80% across a shape learning task. Performance across recognition tasks was exceptionally low to well below average, suggesting negligible evidence for information consolidation.   Results of emotional screening instruments suggested that recent symptoms of generalized anxiety were in the minimal range, while symptoms of depression were within normal limits. A screening instrument assessing recent sleep quality suggested the presence of minimal sleep dysfunction.  Tables of Scores:   Note: This summary of test scores accompanies the interpretive report and should not be considered in isolation without reference to the appropriate sections in the text. Descriptors are based on appropriate normative data and may be adjusted based on clinical judgment. Terms such as Within Normal Limits and Outside Normal Limits are used when a more  specific description of the test score cannot be determined. Descriptors refer to the current evaluation only.          Percentile - Normative Descriptor > 98 - Exceptionally High 91-97 - Well Above Average 75-90 - Above Average 25-74 - Average 9-24 - Below Average 2-8 - Well Below Average < 2 - Exceptionally Low         Validity: December 2022 December 2023 Current  DESCRIPTOR         DCT: --- --- --- --- Outside Normal Limits  NAB EVI: --- --- --- --- Within Normal Limits         Orientation:        Raw Score Raw Score Raw Score Percentile   NAB Orientation, Form 1 28/29 29/29 27/29  --- ---         Cognitive Screening:        Raw Score Raw Score Raw Score Percentile   SLUMS: 15/30 15/30 15/30  --- ---         Intellectual Functioning:        Standard Score Standard Score Standard Score Percentile   Test of Premorbid Functioning: 82 80 84 14 Below Average         Memory:       NAB Memory Module, Form 1: Standard Score/ T Score Standard Score/ T Score Standard Score/ T Score Percentile   Total Memory Index 62 57 55 <1 Exceptionally Low  List Learning         Total Trials 1-3 12/36 (28) 12/36 (29) 9/36 (19) <1 Exceptionally Low    Short Delay Free Recall 1/12 (19) 0/12 (19) 0/12 (19) <1 Exceptionally Low    Long Delay Free Recall 0/12 (21) 0/12 (23) 0/12 (23) <1 Exceptionally Low    Retention Percentage 0 (<19) 0 (22) 0 (22) <1 Exceptionally Low    Recognition Discriminability 2 (34) 3 (39) 0 (30) 2 Well Below Average  Shape Learning         Total Trials 1-3 18/27 (56) 13/27 (44) 9/27 (31) 3 Well Below Average    Delayed Recall 6/9 (51) 1/9 (19) 4/9 (40) 16 Below Average    Retention Percentage 75 (43) 17 (26) 80 (44) 27 Average    Recognition Discriminability 3 (32) 4 (36) 1 (21) <1 Exceptionally Low  Story Learning         Immediate Recall 31/80 (26) 35/80 (30) 26/80 (23) <1 Exceptionally Low    Delayed Recall 16/40 (30) 0/40 (28) 0/40 (28) 2 Well Below Average     Retention Percentage 100 (55) 0 (<19) 0 (<19) <1 Exceptionally Low  Daily Living Memory         Immediate Recall 16/51 (19) 12/51 (19) 25/51 (27) 1 Exceptionally Low    Delayed Recall 3/17 (19) 1/17 (19) 0/17 (19) <1 Exceptionally Low    Retention Percentage 38 (<19) 14 (<19) 0 (<19) <1 Exceptionally Low    Recognition Hits 5/10 (20) 3/10 (<19) 3/10 (<19) <1 Exceptionally Low         Attention/Executive Function:       Trail Making Test (TMT): Raw Score (T Score) Raw Score (T Score) Raw Score (T Score) Percentile     Part A 98 secs.,  3 errors (22) 107 secs.,  2 errors (24) 92 secs.,  0 errors (24) <1 Exceptionally Low    Part B Discontinued Discontinued Discontinued --- Impaired          Scaled Score Scaled Score Scaled Score Percentile  WAIS-IV Coding: 4 4 1  <1 Exceptionally Low          NAB Attention Module, Form 1: T Score T Score T Score Percentile     Digits Forward 44 44 44 27 Average    Digits Backwards 25 25 25 1  Exceptionally Low          Scaled Score Scaled Score Scaled Score Percentile   WAIS-IV Similarities: 4 4 5 5  Well Below Average         D-KEFS Color-Word Interference Test: Raw Score (Scaled Score) Raw Score (Scaled Score) Raw Score (Scaled Score) Percentile     Color Naming 45 secs. (5) 50 secs. (3) 53 secs. (2) <1 Exceptionally Low    Word Reading 30 secs. (7) 36 secs. (5) 36 secs. (5) 5 Well Below Average    Inhibition 123 secs. (1) Discontinued Discontinued --- Impaired      Total Errors 18 errors (1) --- --- --- ---    Inhibition/Switching 140 secs. (1) Discontinued Discontinued --- Impaired      Total Errors 18 errors (1) --- --- --- ---         D-KEFS Verbal Fluency Test: Raw Score (Scaled Score) Raw Score (Scaled Score) Raw Score (Scaled Score) Percentile     Letter Total Correct --- 26 (7) 28 (8) 25 Average    Category Total Correct --- 28 (8) 25 (7) 16 Below Average    Category Switching Total Correct --- 6 (3) 7 (4) 2 Well Below Average     Category Switching Accuracy --- 2 (1) 5 (4) 2 Well Below Average      Total Set Loss Errors --- 12 (1) 3 (9) 37 Average      Total Repetition Errors --- 12 (1) 20 (1) <1 Exceptionally Low         NAB Executive Functions Module, Form 1: T Score T Score T Score Percentile     Judgment 52 53 48 42 Average         Language:       Verbal Fluency Test: Raw Score (T Score) Raw Score (T Score) Raw Score (T Score) Percentile     Phonemic Fluency (FAS) 23 (36) 26 (41) 28 (41) 18 Below Average    Animal Fluency 18 (50) 15 (48) 12 (40) 16 Below Average          NAB Language Module, Form 1: T Score T Score T Score Percentile     Auditory Comprehension 32 19 19 <1 Exceptionally Low    Naming 28/31 (33) 29/31 (43) 28/31 (35) 7 Well Below Average         Visuospatial/Visuoconstruction:        Raw Score Raw Score Raw Score Percentile   Clock Drawing: 9/10 6/10 5/10 --- Impaired         NAB Spatial Module, Form 1: T Score T Score T Score Percentile     Visual Discrimination --- 19 26 1  Exceptionally Low    Figure Drawing Copy 39 50 50 50 Average          Scaled Score Scaled Score Scaled Score Percentile   WAIS-IV Block Design: 3 3 3 1  Exceptionally Low         Mood and Personality:        Raw Score Raw Score Raw Score Percentile   Geriatric Depression Scale: --- 4 5 --- Within Normal Limits  Geriatric Anxiety Scale: --- 3 4 --- Minimal    Somatic --- 2 2 ---  Minimal    Cognitive --- 1 1 --- Minimal    Affective --- 0 1 --- Minimal         Additional Questionnaires:        Raw Score Raw Score Raw Score Percentile   PROMIS Sleep Disturbance Questionnaire: 10 17 10  --- None to Slight   Informed Consent and Coding/Compliance:   The current evaluation represents a clinical evaluation for the purposes previously outlined by the referral source and is in no way reflective of a forensic evaluation.   Mr. Brindle was provided with a verbal description of the nature and purpose of the present  neuropsychological evaluation. Also reviewed were the foreseeable risks and/or discomforts and benefits of the procedure, limits of confidentiality, and mandatory reporting requirements of this provider. The patient was given the opportunity to ask questions and receive answers about the evaluation. Oral consent to participate was provided by the patient.   This evaluation was conducted by Arthea KYM Maryland, Ph.D., ABPP-CN, board certified clinical neuropsychologist. Mr. Ayars completed a clinical interview with Dr. Maryland, billed as one unit [phone], and 145 minutes of cognitive testing and scoring, billed as one unit [phone] and four additional units [id number]. Psychometrist Lonell Jude, B.S. assisted Dr. Maryland with test administration and scoring procedures. As a separate and discrete service, one unit [id number] and two units [phone] were billed for Dr. Loralee time spent in interpretation and report writing.

## 2023-05-20 NOTE — Progress Notes (Signed)
   Psychometrician Note   Cognitive testing was administered to [name] by Lonell Jude, B.S. (psychometrist) under the supervision of Dr. Arthea KYM Maryland, Ph.D., licensed psychologist on 05/20/2023. [name] did not appear overtly distressed by the testing session per behavioral observation or responses across self-report questionnaires. Rest breaks were offered.    The battery of tests administered was selected by Dr. Arthea KYM Maryland, Ph.D. with consideration to [name]'s current level of functioning, the nature of his symptoms, emotional and behavioral responses during interview, level of literacy, observed level of motivation/effort, and the nature of the referral question. This battery was communicated to the psychometrist. Communication between Dr. Arthea KYM Maryland, Ph.D. and the psychometrist was ongoing throughout the evaluation and Dr. Arthea KYM Maryland, Ph.D. was immediately accessible at all times. Dr. Arthea KYM Maryland, Ph.D. provided supervision to the psychometrist on the date of this service to the extent necessary to assure the quality of all services provided.    [name] will return within approximately 1-2 weeks for an interactive feedback session with Dr. Maryland at which time his test performances, clinical impressions, and treatment recommendations will be reviewed in detail. [name] understands he can contact our office should he require our assistance before this time.  A total of 145 minutes of billable time were spent face-to-face with [name] by the psychometrist. This includes both test administration and scoring time. Billing for these services is reflected in the clinical report generated by Dr. Arthea KYM Maryland, Ph.D.  This note reflects time spent with the psychometrician and does not include test scores or any clinical interpretations made by Dr. Maryland. The full report will follow in a separate note.

## 2023-05-24 ENCOUNTER — Ambulatory Visit: Payer: Medicare PPO | Admitting: Physician Assistant

## 2023-05-24 ENCOUNTER — Encounter: Payer: Self-pay | Admitting: Internal Medicine

## 2023-05-24 ENCOUNTER — Encounter: Payer: Self-pay | Admitting: Physician Assistant

## 2023-05-25 ENCOUNTER — Other Ambulatory Visit: Payer: Self-pay | Admitting: Physician Assistant

## 2023-05-25 MED ORDER — DONEPEZIL HCL 10 MG PO TABS: 10.0000 mg | ORAL_TABLET | Freq: Every day | ORAL | 3 refills | Status: DC

## 2023-05-27 ENCOUNTER — Ambulatory Visit: Payer: Medicare PPO | Admitting: Psychology

## 2023-05-27 DIAGNOSIS — F0280 Dementia in other diseases classified elsewhere without behavioral disturbance: Secondary | ICD-10-CM

## 2023-05-27 DIAGNOSIS — F0150 Vascular dementia without behavioral disturbance: Secondary | ICD-10-CM | POA: Diagnosis not present

## 2023-05-27 DIAGNOSIS — G309 Alzheimer's disease, unspecified: Secondary | ICD-10-CM | POA: Diagnosis not present

## 2023-05-27 NOTE — Progress Notes (Signed)
   Neuropsychology Feedback Session [name]. [facility] [place] Department of Neurology  Reason for Referral:   [name] is a 73 y.o. 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   Feedback:   [name] completed a comprehensive neuropsychological evaluation on 05/20/2023. Please refer to that encounter for the full report and recommendations. Briefly, results suggested diffuse and generally quite severe cognitive impairment. Said impairment was exhibited across processing speed, complex attention, executive functioning, receptive language, confrontation naming, visuospatial abilities (outside of his figure copy), and essentially all aspects of learning and memory. Relative to his previous evaluations in December 2022 and December 2023, further decline was exhibited across essentially all aspects of memory. This was particularly noteworthy across yes/no recognition trials, as performance worsened across all tasks relative to his initial 2022 evaluation, suggesting a greater storage impairment. The underlying etiology continues to be difficult to pinpoint. 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 Neurologically speaking, the presence of an underlying neurodegenerative illness continues to appear likely. Differentiating this illness continues to be challenging due to largely diffuse cognitive impairment.  While underlying Alzheimer's disease may be most likely given memory performances and population base rates, I am unable to conclusively determine this as there remains much symptom overlap between conditions (e.g., this and Lewy body disease and/or vascular contributions). Continued medical monitoring will be important  moving forward.   [name] was accompanied by his wife during the current feedback session. Content of the current session focused on the results of his neuropsychological evaluation. [name] was given the opportunity to ask questions and his questions were answered. He was encouraged to reach out should additional questions arise. A copy of his report was provided at the conclusion of the visit.      One unit [phone] was billed for Dr. Tammy Sours time spent preparing for, conducting, and documenting the current feedback session with [name].

## 2023-06-02 DIAGNOSIS — R310 Gross hematuria: Secondary | ICD-10-CM | POA: Diagnosis not present

## 2023-06-02 DIAGNOSIS — N35011 Post-traumatic bulbous urethral stricture: Secondary | ICD-10-CM | POA: Diagnosis not present

## 2023-06-02 DIAGNOSIS — Z8546 Personal history of malignant neoplasm of prostate: Secondary | ICD-10-CM | POA: Diagnosis not present

## 2023-06-02 NOTE — Progress Notes (Incomplete)
Assessment/Plan:   Dementia likely due to ***  [name] is a very pleasant 73 y.o. RH male with a history of***seen today in follow up for memory loss. Patient is currently on .     Follow up in   months. Continue donepezil 10 mg daily. Side effects were discussed  Recommend good control of her cardiovascular risk factors Continue to control mood as per PCP Continue using CPAP for OSA Monitor driving    Subjective:    This patient is accompanied in the office by *** who supplements the history.  Previous records as well as any outside records available were reviewed prior to todays visit. Patient was last seen on ***   Any changes in memory since last visit? "Worse", needs more time to retrieve the words than before. He also pays less attention to conversations.  HE has more difficulty operating the remote, computer, etc. Patient is able to participate on his ADLs  and even work as a Cytogeneticist.    repeats oneself?  Endorsed Disoriented when walking into a room?  Patient denies ***  Leaving objects?  May misplace things but not in unusual places***  Wandering behavior?  denies   Any personality changes since last visit?  denies   Any worsening depression?:  Denies.   Hallucinations or paranoia?  Denies.   Seizures? denies    Any sleep changes?  Denies vivid dreams, REM behavior or sleepwalking   Sleep apnea?   Denies.   Any hygiene concerns? Denies.  Independent of bathing and dressing?  Endorsed  Does the patient needs help with medications?   Wife is in charge *** Who is in charge of the finances?   Wife is in charge   *** Any changes in appetite?  denies ***   Patient have trouble swallowing? Denies.   Does the patient cook? No Any headaches?   denies   Chronic back pain  denies   Ambulates with difficulty? Due to DM neuropathy may limit his mobility*** Recent falls or head injuries? denies     Unilateral weakness, numbness or tingling? He has DM  neuropathy which causes some inbalance  Any tremors?  Denies   Any anosmia?  Denies   Any incontinence of urine?  Endorsed***, wears diapers Any bowel dysfunction?   Denies      Patient lives with  *** Does the patient drive? No longer drives ***  Neuropsych evaluation 05/2023  Briefly, results suggested diffuse and generally quite severe cognitive impairment. Said impairment was exhibited across processing speed, complex attention, executive functioning, receptive language, confrontation naming, visuospatial abilities (outside of his figure copy), and essentially all aspects of learning and memory. Relative to his previous evaluations in December 2022 and December 2023, further decline was exhibited across essentially all aspects of memory. This was particularly noteworthy across yes/no recognition trials, as performance worsened across all tasks relative to his initial 2022 evaluation, suggesting a greater storage impairment. The underlying etiology continues to be difficult to pinpoint. 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 Neurologically speaking, the presence of an underlying neurodegenerative illness continues to appear likely. Differentiating this illness continues to be challenging due to largely diffuse cognitive impairment.  While underlying Alzheimer's disease may be most likely given memory performances and population base rates, I am unable to conclusively determine this as there remains much symptom overlap between conditions (e.g., this and Lewy body disease and/or vascular contributions). Continued medical monitoring will be important  moving forward.   PREVIOUS MEDICATIONS:   CURRENT MEDICATIONS:  Outpatient Encounter Medications as of 06/07/2023  Medication Sig   ACCU-CHEK GUIDE test strip USE TO CHECK SUGAR 4 TIMES A DAY   acetaminophen (TYLENOL) 500 MG tablet Take 1,000 mg by mouth every 6 (six) hours as needed for mild pain.    atorvastatin (LIPITOR) 20 MG tablet TAKE 1 TABLET DAILY EXCEPT TAKE 1/2 TABLET ON TUESDAY, THURSDAY, AND SATURDAYS   carvedilol (COREG) 25 MG tablet TAKE 1 TABLET TWICE DAILY WITH MEALS   Cholecalciferol (VITAMIN D) 50 MCG (2000 UT) tablet Take 2,000 Units by mouth daily.   clopidogrel (PLAVIX) 75 MG tablet TAKE 1 TABLET EVERY DAY   cyanocobalamin (VITAMIN B12) 1000 MCG tablet TAKE 1 TABLET EVERY DAY (Patient taking differently: Take 1,000 mcg by mouth 3 (three) times a week.)   digoxin (LANOXIN) 0.125 MG tablet TAKE 1 TABLET EVERY DAY (Patient taking differently: Take 0.0625 mg by mouth daily.)   docusate sodium (COLACE) 100 MG capsule Take 1 capsule (100 mg total) by mouth 2 (two) times daily as needed for mild constipation.   donepezil (ARICEPT) 10 MG tablet Take 1 tablet (10 mg total) by mouth daily.   famotidine (PEPCID) 20 MG tablet TAKE 1 TABLET EVERY DAY   JANUMET 50-1000 MG tablet TAKE 1 TABLET EVERY DAY WITH A MEAL   Lancets (ONETOUCH ULTRASOFT) lancets Use as instructed to test sugar daily and prn. Dx E11.9   losartan (COZAAR) 25 MG tablet TAKE 1 TABLET (25 MG TOTAL) BY MOUTH AT BEDTIME.   NON FORMULARY Pt uses a cpap nightly   OVER THE COUNTER MEDICATION Apply 1 Application topically as needed (pain). Nervine Cream   pantoprazole (PROTONIX) 40 MG tablet TAKE 1 TABLET EVERY DAY   SODIUM FLUORIDE 5000 SENSITIVE 1.1-5 % GEL Place 1 Application onto teeth daily.   sotalol (BETAPACE) 120 MG tablet TAKE 1 TABLET TWICE DAILY   No facility-administered encounter medications on file as of 06/07/2023.       11/13/2022    9:00 AM 11/10/2021    6:00 PM  MMSE - Mini Mental State Exam  Orientation to time 2 5  Orientation to Place 3 5  Registration 3 3  Attention/ Calculation 2 5  Recall 3 3  Language- name 2 objects 2 2  Language- repeat 1 1  Language- follow 3 step command 3 3  Language- read & follow direction 1 1  Write a sentence 1 1  Copy design 0 1  Total score 21 30       12/05/2020   10:00 AM  Montreal Cognitive Assessment   Visuospatial/ Executive (0/5) 3  Naming (0/3) 2  Attention: Read list of digits (0/2) 2  Attention: Read list of letters (0/1) 1  Attention: Serial 7 subtraction starting at 100 (0/3) 1  Language: Repeat phrase (0/2) 0  Language : Fluency (0/1) 0  Abstraction (0/2) 0  Delayed Recall (0/5) 1  Orientation (0/6) 6  Total 16    Objective:     PHYSICAL EXAMINATION:    VITALS:  There were no vitals filed for this visit.  GEN:  The patient appears stated age and is in NAD. HEENT:  Normocephalic, atraumatic.   Neurological examination:  General: NAD, well-groomed, appears stated age. Orientation: The patient is alert. Oriented to person, place and date Cranial nerves: There is good facial symmetry.The speech is fluent and clear. No aphasia or dysarthria. Fund of knowledge is appropriate. Recent and remote memory are  impaired. Attention and concentration are reduced.  Able to name objects and repeat phrases.  Hearing is intact to conversational tone. *** Sensation: Sensation is intact to light touch throughout Motor: Strength is at least antigravity x4. DTR's 2/4 in UE/LE     Movement examination: Tone: There is normal tone in the UE/LE Abnormal movements:  no tremor.  No myoclonus.  No asterixis.   Coordination:  There is no decremation with RAM's. Normal finger to nose  Gait and Station: The patient has no*** difficulty arising out of a deep-seated chair without the use of the hands. The patient's stride length is good.  Gait is cautious and narrow.    Thank you for allowing [place] the opportunity to participate in the care of this nice patient. Please do not hesitate to contact us for any questions or concerns.   Total time spent on today's visit was *** minutes dedicated to this patient today, preparing to see patient, examining the patient, ordering tests and/or medications and counseling the patient, documenting clinical  information in the EHR or other health record, independently interpreting results and communicating results to the patient/family, discussing treatment and goals, answering patient's questions and coordinating care.  Cc:  Pincus Sanes, MD  Marlowe Kays 06/02/2023 5:35 PM

## 2023-06-03 ENCOUNTER — Encounter: Payer: Self-pay | Admitting: Internal Medicine

## 2023-06-07 ENCOUNTER — Other Ambulatory Visit: Payer: Medicare PPO

## 2023-06-07 ENCOUNTER — Encounter: Payer: Self-pay | Admitting: Physician Assistant

## 2023-06-07 ENCOUNTER — Ambulatory Visit: Payer: Medicare PPO | Admitting: Physician Assistant

## 2023-06-07 VITALS — BP 140/70 | HR 69 | Resp 18 | Ht 69.0 in

## 2023-06-07 DIAGNOSIS — F0280 Dementia in other diseases classified elsewhere without behavioral disturbance: Secondary | ICD-10-CM | POA: Diagnosis not present

## 2023-06-07 DIAGNOSIS — F0150 Vascular dementia without behavioral disturbance: Secondary | ICD-10-CM

## 2023-06-07 DIAGNOSIS — G309 Alzheimer's disease, unspecified: Secondary | ICD-10-CM | POA: Diagnosis not present

## 2023-06-07 DIAGNOSIS — R413 Other amnesia: Secondary | ICD-10-CM | POA: Diagnosis not present

## 2023-06-07 NOTE — Patient Instructions (Signed)
It was a pleasure to see you today at our office.   Recommendations:  Follow up in  6  months after the neurocognitive testing Continue donepezil 10 mg daily      Whom to call:  Memory  decline, memory medications: Call our office [phone]   For psychiatric meds, mood meds: Please have your primary care physician manage these medications.       For assessment of decision of mental capacity and competency:  Call Dr. Erick Blinks, geriatric psychiatrist at [phone]  For guidance in geriatric dementia issues please call [facility] [phone]     If you have any severe symptoms of a stroke, or other severe issues such as confusion,severe chills or fever, etc call 911 or go to the ER as you may need to be evaluated further        RECOMMENDATIONS FOR ALL PATIENTS WITH MEMORY PROBLEMS: 1. Continue to exercise (Recommend 30 minutes of walking everyday, or 3 hours every week) 2. Increase social interactions - continue going to [place] and enjoy social gatherings with friends and family 3. Eat healthy, avoid fried foods and eat more fruits and vegetables 4. Maintain adequate blood pressure, blood sugar, and blood cholesterol level. Reducing the risk of stroke and cardiovascular disease also helps promoting better memory. 5. Avoid stressful situations. Live a simple life and avoid aggravations. Organize your time and prepare for the next day in anticipation. 6. Sleep well, avoid any interruptions of sleep and avoid any distractions in the bedroom that may interfere with adequate sleep quality 7. Avoid sugar, avoid sweets as there is a strong link between excessive sugar intake, diabetes, and cognitive impairment We discussed the Mediterranean diet, which has been shown to help patients reduce the risk of progressive memory disorders and reduces cardiovascular risk. This includes eating fish, eat fruits and green leafy vegetables, nuts like almonds and hazelnuts,  walnuts, and also use olive oil. Avoid fast foods and fried foods as much as possible. Avoid sweets and sugar as sugar use has been linked to worsening of memory function.  There is always a concern of gradual progression of memory problems. If this is the case, then we may need to adjust level of care according to patient needs. Support, both to the patient and caregiver, should then be put into place.    FALL PRECAUTIONS: Be cautious when walking. Scan the area for obstacles that may increase the risk of trips and falls. When getting up in the mornings, sit up at the edge of the bed for a few minutes before getting out of bed. Consider elevating the bed at the head end to avoid drop of blood pressure when getting up. Walk always in a well-lit room (use night lights in the walls). Avoid area rugs or power cords from appliances in the middle of the walkways. Use a walker or a cane if necessary and consider physical therapy for balance exercise. Get your eyesight checked regularly.  FINANCIAL OVERSIGHT: Supervision, especially oversight when making financial decisions or transactions is also recommended.  HOME SAFETY: Consider the safety of the kitchen when operating appliances like stoves, microwave oven, and blender. Consider having supervision and share cooking responsibilities until no longer able to participate in those. Accidents with firearms and other hazards in the house should be identified and addressed as well.   ABILITY TO BE LEFT ALONE: If patient is unable to contact 911 operator, consider using LifeLine, or when the need is there, arrange for someone  to stay with patients. Smoking is a fire hazard, consider supervision or cessation. Risk of wandering should be assessed by caregiver and if detected at any point, supervision and safe proof recommendations should be instituted.  MEDICATION SUPERVISION: Inability to self-administer medication needs to be constantly addressed. Implement a  mechanism to ensure safe administration of the medications.   DRIVING: Regarding driving, in patients with progressive memory problems, driving will be impaired. We advise to have someone else do the driving if trouble finding directions or if minor accidents are reported. Independent driving assessment is available to determine safety of driving.   If you are interested in the driving assessment, you can contact the following:  [employer] in [place] [phone]  [facility] [phone]  [facility] [phone]  [facility] [phone] or [phone]

## 2023-06-11 ENCOUNTER — Encounter: Payer: Self-pay | Admitting: Physician Assistant

## 2023-06-12 LAB — QUEST AD-DETECT™, BETA-AMYLOID 42/40 RATIO, PLASMA
ABETA 40: 316 pg/mL
ABETA 42/40 RATIO: 0.161 (ref >=0.170)
ABETA 42: 51 pg/mL

## 2023-06-12 LAB — QUEST AD-DETECT® PHOSPHORYLATED TAU181(P-TAU181), PLASMA: QUEST AD DETECT PTAU181, PLASMA: 0.79 pg/mL (ref <=1.07)

## 2023-06-12 LAB — QUEST AD-DETECT APOLIPOPROTEIN E (APOE) ISOFORM, PLASMA

## 2023-06-14 ENCOUNTER — Encounter: Payer: Self-pay | Admitting: Internal Medicine

## 2023-06-14 NOTE — Telephone Encounter (Signed)
Have discussed his case with other physicians. We have done all the proper testing. I recommend that you seek a second opinion to reach the answer to your satisfaction. Thank you

## 2023-06-14 NOTE — Telephone Encounter (Signed)
Wife has to wait for all the results to be back. So far, it confirms that there is no Alzheimer's

## 2023-06-15 ENCOUNTER — Encounter: Payer: Self-pay | Admitting: Physician Assistant

## 2023-06-16 ENCOUNTER — Other Ambulatory Visit: Payer: Self-pay | Admitting: Physician Assistant

## 2023-06-16 MED ORDER — DONEPEZIL HCL 10 MG PO TABS: 10.0000 mg | ORAL_TABLET | Freq: Every day | ORAL | 3 refills | Status: DC

## 2023-06-17 ENCOUNTER — Ambulatory Visit (INDEPENDENT_AMBULATORY_CARE_PROVIDER_SITE_OTHER): Payer: Medicare PPO

## 2023-06-17 DIAGNOSIS — I428 Other cardiomyopathies: Secondary | ICD-10-CM

## 2023-06-17 LAB — CUP PACEART REMOTE DEVICE CHECK
Battery Remaining Longevity: 18 mo
Battery Voltage: 2.9 V
Brady Statistic RV Percent Paced: 0.01 %
Date Time Interrogation Session: 20250206033625
HighPow Impedance: 61 Ohm
Implantable Lead Connection Status: 753985
Implantable Lead Implant Date: 20140530
Implantable Lead Location: 753860
Implantable Lead Model: 6935
Implantable Pulse Generator Implant Date: 20140530
Lead Channel Impedance Value: 361 Ohm
Lead Channel Impedance Value: 418 Ohm
Lead Channel Pacing Threshold Amplitude: 0.625 V
Lead Channel Pacing Threshold Pulse Width: 0.4 ms
Lead Channel Sensing Intrinsic Amplitude: 6.125 mV
Lead Channel Sensing Intrinsic Amplitude: 6.125 mV
Lead Channel Setting Pacing Amplitude: 2.5 V
Lead Channel Setting Pacing Pulse Width: 0.4 ms
Lead Channel Setting Sensing Sensitivity: 0.3 mV

## 2023-06-18 ENCOUNTER — Encounter: Payer: Self-pay | Admitting: Internal Medicine

## 2023-06-18 ENCOUNTER — Encounter: Payer: Self-pay | Admitting: Podiatry

## 2023-06-18 ENCOUNTER — Ambulatory Visit: Payer: Medicare PPO | Admitting: Podiatry

## 2023-06-18 ENCOUNTER — Other Ambulatory Visit: Payer: Self-pay | Admitting: Physician Assistant

## 2023-06-18 DIAGNOSIS — M79675 Pain in left toe(s): Secondary | ICD-10-CM

## 2023-06-18 DIAGNOSIS — B351 Tinea unguium: Secondary | ICD-10-CM | POA: Diagnosis not present

## 2023-06-18 DIAGNOSIS — M79674 Pain in right toe(s): Secondary | ICD-10-CM

## 2023-06-18 DIAGNOSIS — E0842 Diabetes mellitus due to underlying condition with diabetic polyneuropathy: Secondary | ICD-10-CM

## 2023-06-18 MED ORDER — DONEPEZIL HCL 10 MG PO TABS: 10.0000 mg | ORAL_TABLET | Freq: Every day | ORAL | 0 refills | Status: DC

## 2023-06-18 NOTE — Progress Notes (Signed)

## 2023-06-18 NOTE — Telephone Encounter (Signed)
 Sent to [id number] till [facility] sends donepezil  10 mg daily by mail. Thanks

## 2023-06-22 DIAGNOSIS — N35011 Post-traumatic bulbous urethral stricture: Secondary | ICD-10-CM | POA: Diagnosis not present

## 2023-06-22 DIAGNOSIS — R310 Gross hematuria: Secondary | ICD-10-CM | POA: Diagnosis not present

## 2023-06-28 ENCOUNTER — Other Ambulatory Visit: Payer: Self-pay

## 2023-06-28 ENCOUNTER — Other Ambulatory Visit: Payer: Self-pay | Admitting: Physician Assistant

## 2023-06-28 MED ORDER — DONEPEZIL HCL 10 MG PO TABS: 10.0000 mg | ORAL_TABLET | Freq: Every day | ORAL | 1 refills | Status: DC

## 2023-07-05 DIAGNOSIS — Z8546 Personal history of malignant neoplasm of prostate: Secondary | ICD-10-CM | POA: Diagnosis not present

## 2023-07-05 DIAGNOSIS — C61 Malignant neoplasm of prostate: Secondary | ICD-10-CM | POA: Diagnosis not present

## 2023-07-11 ENCOUNTER — Other Ambulatory Visit: Payer: Self-pay | Admitting: Physician Assistant

## 2023-07-12 ENCOUNTER — Other Ambulatory Visit: Payer: Self-pay | Admitting: Physician Assistant

## 2023-07-12 ENCOUNTER — Other Ambulatory Visit: Payer: Self-pay | Admitting: Internal Medicine

## 2023-07-12 DIAGNOSIS — Z8546 Personal history of malignant neoplasm of prostate: Secondary | ICD-10-CM | POA: Diagnosis not present

## 2023-07-12 DIAGNOSIS — N35011 Post-traumatic bulbous urethral stricture: Secondary | ICD-10-CM | POA: Diagnosis not present

## 2023-07-16 ENCOUNTER — Ambulatory Visit: Payer: Medicare PPO | Admitting: Internal Medicine

## 2023-07-18 ENCOUNTER — Encounter: Payer: Self-pay | Admitting: Internal Medicine

## 2023-07-18 NOTE — Patient Instructions (Addendum)
      Blood work was ordered.       Medications changes include :   None   We can consider lyrica, amitriptyline, duloxetine for the neuropathy     Return in about 6 months (around 01/19/2024) for Physical Exam.

## 2023-07-18 NOTE — Progress Notes (Unsigned)
 Subjective:    Patient ID: [name], male    DOB: 27-Dec-1950, 73 y.o.   MRN: [id number]     HPI [name] is here for follow up of his chronic medical problems. He is here with his wife.  She helps provide some history secondary to his dementia.  He uses ellipta sometimes.  His appetite is good.   Medications and allergies reviewed with patient and updated if appropriate.  Current Outpatient Medications on File Prior to Visit  Medication Sig Dispense Refill   ACCU-CHEK GUIDE test strip USE TO CHECK SUGAR 4 TIMES A DAY 100 strip 4   acetaminophen (TYLENOL) 500 MG tablet Take 1,000 mg by mouth every 6 (six) hours as needed for mild pain.     atorvastatin (LIPITOR) 20 MG tablet TAKE 1 TABLET DAILY EXCEPT TAKE 1/2 TABLET ON TUESDAY, THURSDAY, AND SATURDAYS 72 tablet 3   carvedilol (COREG) 25 MG tablet Take 1 tablet (25 mg total) by mouth 2 (two) times daily with a meal. 180 tablet 3   Cholecalciferol (VITAMIN D) 50 MCG (2000 UT) tablet Take 2,000 Units by mouth daily.     clopidogrel (PLAVIX) 75 MG tablet TAKE 1 TABLET EVERY DAY 90 tablet 3   cyanocobalamin (VITAMIN B12) 1000 MCG tablet TAKE 1 TABLET EVERY DAY (Patient taking differently: Take 1,000 mcg by mouth 3 (three) times a week.) 90 tablet 3   digoxin (LANOXIN) 0.125 MG tablet TAKE 1 TABLET EVERY DAY (Patient taking differently: Take 0.0625 mg by mouth daily.) 90 tablet 3   docusate sodium (COLACE) 100 MG capsule Take 1 capsule (100 mg total) by mouth 2 (two) times daily as needed for mild constipation. 60 capsule 2   donepezil (ARICEPT) 10 MG tablet Take 1 tablet (10 mg total) by mouth daily. 90 tablet 1   famotidine (PEPCID) 20 MG tablet TAKE 1 TABLET EVERY DAY 90 tablet 3   JANUMET 50-1000 MG tablet TAKE 1 TABLET EVERY DAY WITH A MEAL 90 tablet 3   Lancets (ONETOUCH ULTRASOFT) lancets Use as instructed to test sugar daily and prn. Dx E11.9 100 each 12   losartan (COZAAR) 25 MG tablet TAKE 1 TABLET (25 MG TOTAL) BY MOUTH  AT BEDTIME. 90 tablet 3   memantine (NAMENDA) 5 MG tablet TAKE 1 TABLET EVERY NIGHT 90 tablet 3   NON FORMULARY Pt uses a cpap nightly     OVER THE COUNTER MEDICATION Apply 1 Application topically as needed (pain). Nervine Cream     pantoprazole (PROTONIX) 40 MG tablet TAKE 1 TABLET EVERY DAY 90 tablet 3   SODIUM FLUORIDE 5000 SENSITIVE 1.1-5 % GEL Place 1 Application onto teeth daily.     sotalol (BETAPACE) 120 MG tablet TAKE 1 TABLET TWICE DAILY 180 tablet 3   No current facility-administered medications on file prior to visit.     Review of Systems  Constitutional:  Negative for appetite change and fever.  HENT:  Negative for trouble swallowing.   Respiratory:  Negative for cough, shortness of breath and wheezing.   Cardiovascular:  Negative for chest pain, palpitations and leg swelling.  Gastrointestinal:  Negative for abdominal pain, constipation and diarrhea.       No gerd  Neurological:  Positive for numbness (N/T in b/l feet and burning). Negative for light-headedness and headaches.       Objective:   Vitals:   07/19/23 1320  BP: 136/84  Pulse: 70  Temp: 97.7 F (36.5 C)  SpO2: 99%  BP Readings from Last 3 Encounters:  07/19/23 136/84  06/07/23 (!) 140/70  05/19/23 138/82   Wt Readings from Last 3 Encounters:  07/19/23 179 lb (81.2 kg)  05/19/23 178 lb (80.7 kg)  04/20/23 185 lb (83.9 kg)   Body mass index is 26.43 kg/m.    Physical Exam Constitutional:      General: He is not in acute distress.    Appearance: Normal appearance. He is not ill-appearing.  HENT:     Head: Normocephalic and atraumatic.  Eyes:     Conjunctiva/sclera: Conjunctivae normal.  Cardiovascular:     Rate and Rhythm: Normal rate and regular rhythm.     Heart sounds: Normal heart sounds.  Pulmonary:     Effort: Pulmonary effort is normal. No respiratory distress.     Breath sounds: Normal breath sounds. No wheezing or rales.  Musculoskeletal:     Right lower leg: No edema.      Left lower leg: No edema.  Skin:    General: Skin is warm and dry.     Findings: No rash.  Neurological:     Mental Status: He is alert. Mental status is at baseline.  Psychiatric:        Mood and Affect: Mood normal.        Lab Results  Component Value Date   WBC 4.8 04/01/2023   HGB 11.6 (L) 04/01/2023   HCT 36.3 (L) 04/01/2023   PLT 200 04/01/2023   GLUCOSE 162 (H) 04/01/2023   CHOL 93 01/14/2023   TRIG 47.0 01/14/2023   HDL 39.30 01/14/2023   LDLCALC 45 01/14/2023   ALT 10 01/14/2023   AST 12 01/14/2023   NA 140 04/01/2023   K 3.7 04/01/2023   CL 107 04/01/2023   CREATININE 0.97 04/01/2023   BUN 12 04/01/2023   CO2 25 04/01/2023   TSH 2.52 01/14/2023   PSA 0.00 Repeated and verified X2. (L) 11/14/2020   INR 1.1 01/05/2022   HGBA1C 6.1 01/14/2023   MICROALBUR 7.3 (H) 01/14/2023     Assessment & Plan:    See Problem List for Assessment and Plan of chronic medical problems.

## 2023-07-19 ENCOUNTER — Ambulatory Visit: Payer: Medicare PPO | Admitting: Internal Medicine

## 2023-07-19 VITALS — BP 136/84 | HR 70 | Temp 97.7°F | Ht 69.0 in | Wt 179.0 lb

## 2023-07-19 DIAGNOSIS — F0280 Dementia in other diseases classified elsewhere without behavioral disturbance: Secondary | ICD-10-CM | POA: Diagnosis not present

## 2023-07-19 DIAGNOSIS — I5022 Chronic systolic (congestive) heart failure: Secondary | ICD-10-CM | POA: Diagnosis not present

## 2023-07-19 DIAGNOSIS — F0150 Vascular dementia without behavioral disturbance: Secondary | ICD-10-CM

## 2023-07-19 DIAGNOSIS — E0842 Diabetes mellitus due to underlying condition with diabetic polyneuropathy: Secondary | ICD-10-CM

## 2023-07-19 DIAGNOSIS — Z8673 Personal history of transient ischemic attack (TIA), and cerebral infarction without residual deficits: Secondary | ICD-10-CM | POA: Diagnosis not present

## 2023-07-19 DIAGNOSIS — G309 Alzheimer's disease, unspecified: Secondary | ICD-10-CM | POA: Diagnosis not present

## 2023-07-19 DIAGNOSIS — E7849 Other hyperlipidemia: Secondary | ICD-10-CM | POA: Diagnosis not present

## 2023-07-19 DIAGNOSIS — K219 Gastro-esophageal reflux disease without esophagitis: Secondary | ICD-10-CM

## 2023-07-19 DIAGNOSIS — Z7984 Long term (current) use of oral hypoglycemic drugs: Secondary | ICD-10-CM | POA: Diagnosis not present

## 2023-07-19 DIAGNOSIS — D649 Anemia, unspecified: Secondary | ICD-10-CM | POA: Diagnosis not present

## 2023-07-19 DIAGNOSIS — I10 Essential (primary) hypertension: Secondary | ICD-10-CM | POA: Diagnosis not present

## 2023-07-19 DIAGNOSIS — E1151 Type 2 diabetes mellitus with diabetic peripheral angiopathy without gangrene: Secondary | ICD-10-CM

## 2023-07-19 DIAGNOSIS — C61 Malignant neoplasm of prostate: Secondary | ICD-10-CM

## 2023-07-19 LAB — LIPID PANEL
Cholesterol: 109 mg/dL (ref 0–200)
HDL: 42.1 mg/dL (ref >39.00)
LDL Cholesterol: 57 mg/dL (ref 0–99)
NonHDL: 66.88
Total CHOL/HDL Ratio: 3
Triglycerides: 47 mg/dL (ref 0.0–149.0)
VLDL: 9.4 mg/dL (ref 0.0–40.0)

## 2023-07-19 LAB — CBC WITH DIFFERENTIAL/PLATELET
Basophils Absolute: 0 10*3/uL (ref 0.0–0.1)
Basophils Relative: 0.2 % (ref 0.0–3.0)
Eosinophils Absolute: 0 10*3/uL (ref 0.0–0.7)
Eosinophils Relative: 1.2 % (ref 0.0–5.0)
HCT: 37.4 % — ABNORMAL LOW (ref 39.0–52.0)
Hemoglobin: 11.9 g/dL — ABNORMAL LOW (ref 13.0–17.0)
Lymphocytes Relative: 29 % (ref 12.0–46.0)
Lymphs Abs: 1.2 10*3/uL (ref 0.7–4.0)
MCHC: 31.7 g/dL (ref 30.0–36.0)
MCV: 73.9 fl — ABNORMAL LOW (ref 78.0–100.0)
Monocytes Absolute: 0.4 10*3/uL (ref 0.1–1.0)
Monocytes Relative: 8.8 % (ref 3.0–12.0)
Neutro Abs: 2.6 10*3/uL (ref 1.4–7.7)
Neutrophils Relative %: 60.8 % (ref 43.0–77.0)
Platelets: 226 10*3/uL (ref 150.0–400.0)
RBC: 5.07 Mil/uL (ref 4.22–5.81)
RDW: 16.3 % — ABNORMAL HIGH (ref 11.5–15.5)
WBC: 4.2 10*3/uL (ref 4.0–10.5)

## 2023-07-19 LAB — COMPREHENSIVE METABOLIC PANEL
ALT: 10 U/L (ref 0–53)
AST: 13 U/L (ref 0–37)
Albumin: 4.3 g/dL (ref 3.5–5.2)
Alkaline Phosphatase: 102 U/L (ref 39–117)
BUN: 11 mg/dL (ref 6–23)
CO2: 32 meq/L (ref 19–32)
Calcium: 9.7 mg/dL (ref 8.4–10.5)
Chloride: 106 meq/L (ref 96–112)
Creatinine, Ser: 1.06 mg/dL (ref 0.40–1.50)
GFR: 70.29 mL/min (ref >60.00)
Glucose, Bld: 105 mg/dL — ABNORMAL HIGH (ref 70–99)
Potassium: 4.4 meq/L (ref 3.5–5.1)
Sodium: 144 meq/L (ref 135–145)
Total Bilirubin: 0.6 mg/dL (ref 0.2–1.2)
Total Protein: 7.5 g/dL (ref 6.0–8.3)

## 2023-07-19 LAB — IBC PANEL
Iron: 88 ug/dL (ref 42–165)
Saturation Ratios: 32.9 % (ref 20.0–50.0)
TIBC: 267.4 ug/dL (ref 250.0–450.0)
Transferrin: 191 mg/dL — ABNORMAL LOW (ref 212.0–360.0)

## 2023-07-19 LAB — HEMOGLOBIN A1C: Hgb A1c MFr Bld: 5.9 % (ref 4.6–6.5)

## 2023-07-19 LAB — FERRITIN: Ferritin: 146.3 ng/mL (ref 22.0–322.0)

## 2023-07-19 NOTE — Assessment & Plan Note (Addendum)
 Chronic Blood pressure well controlled CBC, CMP Continue Coreg 25 mg twice daily, losartan 25 mg daily, sotalol 120 mg twice daily

## 2023-07-19 NOTE — Assessment & Plan Note (Signed)
 Chronic Following with urology

## 2023-07-19 NOTE — Assessment & Plan Note (Addendum)
 History of TIA Continue atorvastatin 20 mg daily, Plavix 75 mg daily Blood pressure controlled, sugars well-controlled LDL at goal

## 2023-07-19 NOTE — Assessment & Plan Note (Addendum)
 Chronic  Lab Results  Component Value Date   HGBA1C 6.1 01/14/2023   Sugars well controlled Check A1c, urine microalbumin Continue Janumet 50-1000 mg daily Stressed regular exercise, diabetic diet

## 2023-07-19 NOTE — Assessment & Plan Note (Addendum)
 Chronic Lab Results  Component Value Date   LDLCALC 45 01/14/2023   Check lipid panel, CMP Continue atorvastatin 20 mg daily 4 days a week, 10 mg 3 days a week

## 2023-07-19 NOTE — Assessment & Plan Note (Signed)
 Chronic GERD controlled History of Barrett's esophagus Continue famotidine 20 mg daily, pantoprazole 40 mg daily

## 2023-07-19 NOTE — Assessment & Plan Note (Addendum)
 Chronic Previous EMG did show probable diabetic neuropathy Advised checking his feet daily Advised not going barefoot Did not tolerate gabapentin - dizziness Discussed lyrica, amitriptyline, duloxetine They have tried several topical things which really did not work well, but will try some things again-we will agree if he can avoid medication it would be ideal because of possible side effects

## 2023-07-19 NOTE — Assessment & Plan Note (Signed)
Chronic Euvolemic on exam Follows with cardiology Continue current medications

## 2023-07-19 NOTE — Assessment & Plan Note (Signed)
 Chronic Following with neurology-[name] Continue donepezil 10 mg daily, memantine 5 mg nightly Encouraged regular exercise Denies anxiety and depression

## 2023-07-20 ENCOUNTER — Encounter: Payer: Self-pay | Admitting: Internal Medicine

## 2023-07-20 LAB — DIGOXIN LEVEL: Digoxin Level: <0.5 ug/L — ABNORMAL LOW (ref 0.8–2.0)

## 2023-07-22 NOTE — Progress Notes (Signed)
 Remote ICD transmission.

## 2023-09-16 ENCOUNTER — Ambulatory Visit (INDEPENDENT_AMBULATORY_CARE_PROVIDER_SITE_OTHER): Payer: Medicare PPO

## 2023-09-16 DIAGNOSIS — I428 Other cardiomyopathies: Secondary | ICD-10-CM

## 2023-09-16 LAB — CUP PACEART REMOTE DEVICE CHECK
Battery Remaining Longevity: 14 mo
Battery Voltage: 2.89 V
Brady Statistic RV Percent Paced: 0.07 %
Date Time Interrogation Session: 20250508012305
HighPow Impedance: 59 Ohm
Implantable Lead Connection Status: 753985
Implantable Lead Implant Date: 20140530
Implantable Lead Location: 753860
Implantable Lead Model: 6935
Implantable Pulse Generator Implant Date: 20140530
Lead Channel Impedance Value: 342 Ohm
Lead Channel Impedance Value: 418 Ohm
Lead Channel Pacing Threshold Amplitude: 0.625 V
Lead Channel Pacing Threshold Pulse Width: 0.4 ms
Lead Channel Sensing Intrinsic Amplitude: 4.875 mV
Lead Channel Sensing Intrinsic Amplitude: 4.875 mV
Lead Channel Setting Pacing Amplitude: 2.5 V
Lead Channel Setting Pacing Pulse Width: 0.4 ms
Lead Channel Setting Sensing Sensitivity: 0.3 mV

## 2023-09-17 ENCOUNTER — Ambulatory Visit: Payer: Medicare PPO | Admitting: Podiatry

## 2023-09-20 ENCOUNTER — Encounter: Payer: Self-pay | Admitting: Internal Medicine

## 2023-09-22 ENCOUNTER — Encounter: Payer: Self-pay | Admitting: Podiatry

## 2023-09-23 ENCOUNTER — Encounter: Payer: Self-pay | Admitting: Podiatry

## 2023-09-23 ENCOUNTER — Ambulatory Visit: Admitting: Podiatry

## 2023-09-23 DIAGNOSIS — M79675 Pain in left toe(s): Secondary | ICD-10-CM

## 2023-09-23 DIAGNOSIS — B351 Tinea unguium: Secondary | ICD-10-CM

## 2023-09-23 DIAGNOSIS — E0842 Diabetes mellitus due to underlying condition with diabetic polyneuropathy: Secondary | ICD-10-CM

## 2023-09-23 DIAGNOSIS — M79674 Pain in right toe(s): Secondary | ICD-10-CM

## 2023-09-23 NOTE — Progress Notes (Signed)

## 2023-09-28 ENCOUNTER — Other Ambulatory Visit: Payer: Self-pay | Admitting: Internal Medicine

## 2023-09-30 ENCOUNTER — Ambulatory Visit (INDEPENDENT_AMBULATORY_CARE_PROVIDER_SITE_OTHER)

## 2023-09-30 VITALS — BP 140/80 | HR 67 | Ht 68.25 in | Wt 173.0 lb

## 2023-09-30 DIAGNOSIS — Z0000 Encounter for general adult medical examination without abnormal findings: Secondary | ICD-10-CM | POA: Diagnosis not present

## 2023-09-30 NOTE — Patient Instructions (Signed)
 [name] , Thank you for taking time out of your busy schedule to complete your Annual Wellness Visit with me. I enjoyed our conversation and look forward to speaking with you again next year. I, as well as your care team,  appreciate your ongoing commitment to your health goals. Please review the following plan we discussed and let me know if I can assist you in the future. Your Game plan/ To Do List   Follow up Visits: Next Medicare AWV with our clinical staff: 10/03/2024.   Have you seen your provider in the last 6 months (3 months if uncontrolled diabetes)? Yes Next Office Visit with your provider: 01/21/2024.  Clinician Recommendations:  Aim for 30 minutes of exercise or brisk walking, 6-8 glasses of water , and 5 servings of fruits and vegetables each day. Keep up the good work.      This is a list of the screening recommended for you and due dates:  Health Maintenance  Topic Date Due   COVID-19 Vaccine (7 - Pfizer risk 2024-25 season) 07/19/2023   Flu Shot  12/10/2023   Yearly kidney health urinalysis for diabetes  01/14/2024   Hemoglobin A1C  01/19/2024   Eye exam for diabetics  04/27/2024   Yearly kidney function blood test for diabetes  07/18/2024   Complete foot exam   09/22/2024   Medicare Annual Wellness Visit  09/29/2024   Colon Cancer Screening  11/07/2026   DTaP/Tdap/Td vaccine (3 - Td or Tdap) 01/18/2033   Pneumonia Vaccine  Completed   Hepatitis C Screening  Completed   Zoster (Shingles) Vaccine  Completed   HPV Vaccine  Aged Out   Meningitis B Vaccine  Aged Out    Advanced directives: (Copy Requested) Please bring a copy of your health care power of attorney and living will to the office to be added to your chart at your convenience. You can mail to [facility] [address] [place], [place] [address] or email to [email or web address] Advance Care Planning is important because it:  [x]  Makes sure you receive the medical care that is  consistent with your values, goals, and preferences  [x]  It provides guidance to your family and loved ones and reduces their decisional burden about whether or not they are making the right decisions based on your wishes.  Follow the link provided in your after visit summary or read over the paperwork we have mailed to you to help you started getting your Advance Directives in place. If you need assistance in completing these, please reach out to us  so that we can help you!  See attachments for Preventive Care and Fall Prevention Tips.

## 2023-09-30 NOTE — Progress Notes (Signed)
 Subjective:   [name] is a 72 y.o. who presents for a Medicare Wellness preventive visit.  As a reminder, Annual Wellness Visits don't include a physical exam, and some assessments may be limited, especially if this visit is performed virtually. We may recommend an in-person follow-up visit with your provider if needed.  Visit Complete: In person   Persons Participating in Visit: Patient assisted by wife.  AWV Questionnaire: Yes: Patient Medicare AWV questionnaire was completed by the patient on 09/27/2023; I have confirmed that all information answered by patient is correct and no changes since this date.  Cardiac Risk Factors include: advanced age (>86men, >36 women);diabetes mellitus;hypertension;male gender;dyslipidemia;Other (see comment), Risk factor comments: OSA     Objective:     There were no vitals filed for this visit. There is no height or weight on file to calculate BMI.     09/30/2023    9:04 AM 06/07/2023    3:04 PM 04/01/2023    9:15 AM 11/13/2022    8:59 AM 11/05/2022   10:28 AM 08/14/2022    8:55 AM 05/14/2022    8:50 AM  Advanced Directives  Does Patient Have a Medical Advance Directive? Yes Yes Yes Yes Yes Yes Yes  Type of Estate agent of [place];Living will Healthcare Power of Attorney Living will;Healthcare Power of [employer] Power of [employer] Power of [place];Living will Healthcare Power of Attorney   Does patient want to make changes to medical advance directive?  No - Patient declined  No - Patient declined  No - Patient declined   Copy of Healthcare Power of Attorney in Chart? No - copy requested No - copy requested  No - copy requested No - copy requested No - copy requested     Current Medications (verified) Outpatient Encounter Medications as of 09/30/2023  Medication Sig   ACCU-CHEK GUIDE test strip USE TO CHECK SUGAR 4 TIMES A DAY   acetaminophen  (TYLENOL ) 500 MG tablet Take 1,000 mg by mouth every 6  (six) hours as needed for mild pain.   atorvastatin  (LIPITOR) 20 MG tablet TAKE 1 TABLET DAILY EXCEPT TAKE 1/2 TABLET ON TUESDAY, THURSDAY, AND SATURDAYS   carvedilol  (COREG ) 25 MG tablet Take 1 tablet (25 mg total) by mouth 2 (two) times daily with a meal.   Cholecalciferol (VITAMIN D) 50 MCG (2000 UT) tablet Take 2,000 Units by mouth daily.   clopidogrel  (PLAVIX ) 75 MG tablet TAKE 1 TABLET EVERY DAY   cyanocobalamin  (VITAMIN B12) 1000 MCG tablet TAKE 1 TABLET EVERY DAY (Patient taking differently: Take 1,000 mcg by mouth 3 (three) times a week.)   digoxin  (LANOXIN ) 0.125 MG tablet TAKE 1 TABLET EVERY DAY (Patient taking differently: Take 0.0625 mg by mouth daily.)   docusate sodium  (COLACE) 100 MG capsule Take 1 capsule (100 mg total) by mouth 2 (two) times daily as needed for mild constipation.   donepezil  (ARICEPT ) 10 MG tablet Take 1 tablet (10 mg total) by mouth daily.   famotidine  (PEPCID ) 20 MG tablet TAKE 1 TABLET EVERY DAY   JANUMET  50-1000 MG tablet TAKE 1 TABLET EVERY DAY WITH A MEAL   Lancets (ONETOUCH ULTRASOFT) lancets Use as instructed to test sugar daily and prn. Dx E11.9   losartan  (COZAAR ) 25 MG tablet TAKE 1 TABLET AT BEDTIME   memantine  (NAMENDA ) 5 MG tablet TAKE 1 TABLET EVERY NIGHT   NON FORMULARY Pt uses a cpap nightly   OVER THE COUNTER MEDICATION Apply 1 Application topically as needed (  pain). Nervine Cream   pantoprazole  (PROTONIX ) 40 MG tablet TAKE 1 TABLET EVERY DAY   SODIUM FLUORIDE 5000 SENSITIVE 1.1-5 % GEL Place 1 Application onto teeth daily.   sotalol  (BETAPACE ) 120 MG tablet TAKE 1 TABLET TWICE DAILY   No facility-administered encounter medications on file as of 09/30/2023.    Allergies (verified) Zantac  [ranitidine  hcl]   History: 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   Not compatible with MRI    Barrett's esophagus  09/05/2004   2 cm changes seen at index EGD   Cataract    Chronic back pain 04/25/2019   Chronic systolic heart failure    Colon polyps    tubular adenoma   Diabetic neuropathy 01/14/2022   Difficulty in walking 10/02/2021   Dizziness 01/19/2011   Dysrhythmia    Essential hypertension 08/26/2009   Fatigue 12/02/2020   Generalized anxiety disorder    since defibrillator placement   GERD (gastroesophageal reflux disease)    Hip pain 12/14/2016   History of COVID-19    History of myocardial infarction    Hyperlipidemia    Left knee pain 12/14/2016   Low back pain 05/14/2021   Malignant neoplasm of prostate 09/27/2014   s/p prostatectomy   Mild anemia 01/20/2011   Mixed dementia 04/27/2022   Neuralgia 10/19/2018   NICM (nonischemic cardiomyopathy)    a.normal cors by cath in 2005. b. s/p Medtronic ICD.   OSA (obstructive sleep apnea) 11/10/2012   regular CPAP use   Pain due to onychomycosis of toenails of both feet 02/23/2022   Pain in the coccyx 01/28/2022   Rectal pain 12/21/2021   Right foot pain 12/15/2017   RLS (restless legs syndrome) 11/29/2012   S/P radiation therapy    10/10/2014 through 11/26/2014; Prostate bed 6600 cGy in 33 sessions   Shingles 07/28/2016   TIA (transient ischemic attack) 01/05/2022   Type II diabetes mellitus 08/26/2009   Ventricular tachycardia    a. s/p ICD (generator change 09/2012). b. 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 and placed on sotalol    Visual hallucinations    Vitamin B12 deficiency 03/06/2011   Monthly B12 shots initiated  2011. Nasally  inhaled B12 as of 07/2012   Past Surgical History:  Procedure Laterality Date   CARDIAC DEFIBRILLATOR PLACEMENT  03/11/2004   Medtronic Maximo single lead   CATARACT EXTRACTION     COLONOSCOPY W/ BIOPSIES AND POLYPECTOMY  08/2004, 02/24/11   6mm adenoma in 2006,  5 mm polyp (not recovered) 2012   CYSTOSCOPY WITH DIRECT VISION INTERNAL URETHROTOMY N/A 10/08/2015   Procedure: CYSTOSCOPY WITH DIRECT VISION  INTERNAL URETHROTOMY;  Surgeon: Bart Born, MD;  Location: [facility];  Service: Urology;  Laterality: N/A;   CYSTOSCOPY WITH URETHRAL DILATATION N/A 04/15/2023   Procedure: CYSTOSCOPY AND Reina Cara WITH OPTILUME URETHRAL DILATATION;  Surgeon: Andrez Banker, MD;  Location: [facility];  Service: Urology;  Laterality: N/A;  60 MINUTES   IMPLANTABLE CARDIOVERTER DEFIBRILLATOR GENERATOR CHANGE N/A 10/07/2012   Procedure: IMPLANTABLE CARDIOVERTER DEFIBRILLATOR GENERATOR CHANGE;  Surgeon: Tammie Fall, MD;  Location: [facility] C[facility];  Service: Cardiovascular;  Laterality: N/A;   PROSTATECTOMY  06/12/2003   Dr Levi Real   RECTAL SURGERY     UPPER GASTROINTESTINAL ENDOSCOPY  08/2004, 02/24/11   Barrett's esophagus   Family History  Problem Relation Age of Onset   Dementia Mother    Hypertension Mother  Coronary artery disease Father    Prostate cancer Father    Diabetes Father    Dementia Sister    Dementia Brother    Diabetes Paternal Grandmother    Dementia Maternal Aunt    Stroke Neg Hx    Colon cancer Neg Hx    Colon polyps Neg Hx    Esophageal cancer Neg Hx    Rectal cancer Neg Hx    Stomach cancer Neg Hx    Social History   Socioeconomic History   Marital status: Married    Spouse name: Not on file   Number of children: 2   Years of education: 16   Highest education level: Bachelor's degree (e.g., BA, AB, BS)  Occupational History   Occupation: Retired    Comment: Secretary/administrator  Tobacco Use   Smoking status: Never   Smokeless tobacco: Never  Vaping Use   Vaping status: Never Used  Substance and Sexual Activity   Alcohol use: No   Drug use: No   Sexual activity: Not on file  Other Topics Concern   Not on file  Social History Narrative   Right handed   No caffeine   Two story home   Retired   Lives with wife/2025   Social Drivers of Health   Financial Resource Strain: Low Risk  (09/30/2023)   Overall Financial Resource Strain (CARDIA)     Difficulty of Paying Living Expenses: Not hard at all  Food Insecurity: No Food Insecurity (09/30/2023)   Hunger Vital Sign    Worried About Running Out of Food in the Last Year: Never true    Ran Out of Food in the Last Year: Never true  Transportation Needs: No Transportation Needs (07/15/2023)   PRAPARE - Administrator, Civil Service (Medical): No    Lack of Transportation (Non-Medical): No  Physical Activity: Inactive (09/30/2023)   Exercise Vital Sign    Days of Exercise per Week: 0 days    Minutes of Exercise per Session: 0 min  Stress: No Stress Concern Present (09/30/2023)   [employer] of Occupational Health - Occupational Stress Questionnaire    Feeling of Stress : Not at all  Social Connections: Moderately Integrated (09/30/2023)   Social Connection and Isolation Panel [NHANES]    Frequency of Communication with Friends and Family: More than three times a week    Frequency of Social Gatherings with Friends and Family: More than three times a week    Attends Religious Services: 1 to 4 times per year    Active Member of [employer] or Organizations: No    Attends Banker Meetings: Never    Marital Status: Married    Tobacco Counseling Counseling given: Not Answered    Clinical Intake:  Pre-visit preparation completed: Yes  Pain : No/denies pain     Diabetes: Yes CBG done?: No Did pt. bring in CBG monitor from home?: No  Lab Results  Component Value Date   HGBA1C 5.9 07/19/2023   HGBA1C 6.1 01/14/2023   HGBA1C 5.9 07/14/2022     How often do you need to have someone help you when you read instructions, pamphlets, or other written materials from your doctor or pharmacy?: 1 - Never  Interpreter Needed?: No  Information entered by :: Adelena Desantiago, RMA   Activities of Daily Living     09/27/2023    9:13 AM 04/01/2023    9:18 AM  In your present state of health, do you have  any difficulty performing the following activities:   Hearing? 0    Vision? 1    Difficulty concentrating or making decisions? 1    Walking or climbing stairs? 1    Dressing or bathing? 0    Doing errands, shopping? 1  0  Preparing Food and eating ? N    Using the Toilet? N    In the past six months, have you accidently leaked urine? Y    Do you have problems with loss of bowel control? N    Managing your Medications? Y    Managing your Finances? Y    Housekeeping or managing your Housekeeping? Y       Proxy-reported    Patient Care Team: Colene Dauphin, MD as PCP - General (Internal Medicine) Tammie Fall, MD as PCP - Electrophysiology (Cardiology) Tammie Fall, MD as PCP - Cardiology (Cardiology) Andrez Banker, MD as Attending Physician (Urology) Kenney Peacemaker, MD as Consulting Physician (Gastroenterology) Szabat, Tino Foreman, [facility] (Inactive) as Pharmacist (Pharmacist) Tammie Fall, MD as Consulting Physician (Cardiology) Wertman, Sara E, PA-C (Neurology) [facility]  Indicate any recent Medical Services you may have received from other than [facility] providers in the past year (date may be approximate).     Assessment:    This is a routine wellness examination for [name].  Hearing/Vision screen Hearing Screening - Comments:: Denies hearing difficulties   Vision Screening - Comments:: Wears eyeglasses/ Dr. Anthoney Kipper   Goals Addressed             This Visit's Progress    walk normally after stroke   Not on track      Depression Screen     09/30/2023    9:10 AM 07/19/2023    1:23 PM 11/05/2022   10:29 AM 11/05/2022   10:27 AM 07/14/2022   10:24 AM 11/21/2021   11:44 AM 11/14/2020    9:41 AM  PHQ 2/9 Scores  PHQ - 2 Score 0 0 0 0 0 0 0  PHQ- 9 Score 1  0 0 2      Fall Risk     09/27/2023    9:13 AM 07/19/2023    1:23 PM 06/07/2023    3:04 PM 11/13/2022    8:59 AM 11/05/2022   10:28 AM  Fall Risk   Falls in the past year? 1  0 0 0 0  Number falls in past yr: 0  0 0 0 0  Injury with  Fall? 0  0 0 0 0  Risk for fall due to :  No Fall Risks   No Fall Risks  Follow up Falls evaluation completed;Falls prevention discussed Falls evaluation completed Falls evaluation completed Falls evaluation completed Falls prevention discussed     Proxy-reported    MEDICARE RISK AT HOME:  Medicare Risk at Home Any stairs in or around the home?: (Proxy-Rptd) Yes If so, are there any without handrails?: (Proxy-Rptd) No Home free of loose throw rugs in walkways, pet beds, electrical cords, etc?: (Proxy-Rptd) No Adequate lighting in your home to reduce risk of falls?: (Proxy-Rptd) Yes Life alert?: (Proxy-Rptd) No Use of a cane, walker or w/c?: (Proxy-Rptd) No Grab bars in the bathroom?: (Proxy-Rptd) Yes Shower chair or bench in shower?: (Proxy-Rptd) Yes Elevated toilet seat or a handicapped toilet?: (Proxy-Rptd) Yes  TIMED UP AND GO:  Was the test performed?  Yes  Length of time to ambulate 10 feet: 20 sec Gait slow and steady without  use of assistive device  Cognitive Function: 6CIT completed    11/13/2022    9:00 AM 11/10/2021    6:00 PM  MMSE - Mini Mental State Exam  Orientation to time 2 5  Orientation to Place 3 5  Registration 3 3  Attention/ Calculation 2 5  Recall 3 3  Language- name 2 objects 2 2  Language- repeat 1 1  Language- follow 3 step command 3 3  Language- read & follow direction 1 1  Write a sentence 1 1  Copy design 0 1  Total score 21 30      12/05/2020   10:00 AM  Montreal Cognitive Assessment   Visuospatial/ Executive (0/5) 3  Naming (0/3) 2  Attention: Read list of digits (0/2) 2  Attention: Read list of letters (0/1) 1  Attention: Serial 7 subtraction starting at 100 (0/3) 1  Language: Repeat phrase (0/2) 0  Language : Fluency (0/1) 0  Abstraction (0/2) 0  Delayed Recall (0/5) 1  Orientation (0/6) 6  Total 16      09/30/2023    9:04 AM 11/05/2022   10:30 AM 11/21/2021   11:47 AM 10/18/2019    8:26 AM  6CIT Screen  What Year? 4 points 0  points 0 points 0 points  What month? 0 points 0 points 0 points 0 points  What time? 0 points 0 points 0 points 0 points  Count back from 20 2 points 2 points 0 points 0 points  Months in reverse 2 points 4 points 0 points 0 points  Repeat phrase 10 points 10 points 0 points 0 points  Total Score 18 points 16 points 0 points 0 points    Immunizations Immunization History  Administered Date(s) Administered   Fluad Quad(high Dose 65+) 02/06/2019, 01/15/2021, 02/24/2022   Fluad Trivalent(High Dose 65+) 01/14/2023   Influenza Split 03/10/2012   Influenza, High Dose Seasonal PF 06/16/2016, 06/17/2017   Influenza, Seasonal, Injecte, Preservative Fre 03/10/2014   Influenza,inj,Quad PF,6+ Mos 03/02/2013   Influenza,trivalent, recombinat, inj, PF 01/09/2018   Moderna SARS-COV2 Booster Vaccination 02/24/2022   PFIZER Comirnaty (Gray Top)Covid-19 Tri-Sucrose Vaccine 09/30/2020   PFIZER(Purple Top)SARS-COV-2 Vaccination 05/24/2019, 06/13/2019, 01/23/2020   Pfizer Covid-19 Vaccine Bivalent Booster 73yrs & up 02/04/2021   Pfizer(Comirnaty )Fall Seasonal Vaccine 12 years and older 02/24/2022, 01/19/2023   Pneumococcal Conjugate-13 12/14/2016   Pneumococcal Polysaccharide-23 12/15/2017   Respiratory Syncytial Virus Vaccine ,Recomb Aduvanted(Arexvy ) 05/08/2022   Tdap 11/29/2012, 01/19/2023   Zoster Recombinant(Shingrix) 01/09/2018, 11/21/2021    Screening Tests Health Maintenance  Topic Date Due   COVID-19 Vaccine (7 - Pfizer risk 2024-25 season) 07/19/2023   INFLUENZA VACCINE  12/10/2023   Diabetic kidney evaluation - Urine ACR  01/14/2024   HEMOGLOBIN A1C  01/19/2024   OPHTHALMOLOGY EXAM  04/27/2024   Diabetic kidney evaluation - eGFR measurement  07/18/2024   FOOT EXAM  09/22/2024   Medicare Annual Wellness (AWV)  09/29/2024   Colonoscopy  11/07/2026   DTaP/Tdap/Td (3 - Td or Tdap) 01/18/2033   Pneumonia Vaccine 63+ Years old  Completed   Hepatitis C Screening  Completed   Zoster  Vaccines- Shingrix  Completed   HPV VACCINES  Aged Out   Meningococcal B Vaccine  Aged Out    Health Maintenance  Health Maintenance Due  Topic Date Due   COVID-19 Vaccine (7 - Pfizer risk 2024-25 season) 07/19/2023   Health Maintenance Items Addressed: See Nurse Notes  Additional Screening:  Vision Screening: Recommended annual ophthalmology exams for early detection of glaucoma and  other disorders of the eye.  Dental Screening: Recommended annual dental exams for proper oral hygiene  Community Resource Referral / Chronic Care Management: CRR required this visit?  No   CCM required this visit?  No   Plan:    I have personally reviewed and noted the following in the patient's chart:   Medical and social history Use of alcohol, tobacco or illicit drugs  Current medications and supplements including opioid prescriptions. Patient is not currently taking opioid prescriptions. Functional ability and status Nutritional status Physical activity Advanced directives List of other physicians Hospitalizations, surgeries, and ER visits in previous 12 months Vitals Screenings to include cognitive, depression, and falls Referrals and appointments  In addition, I have reviewed and discussed with patient certain preventive protocols, quality metrics, and best practice recommendations. A written personalized care plan for preventive services as well as general preventive health recommendations were provided to patient.   Dorn Hartshorne L Zeynab Klett, CMA   09/30/2023   After Visit Summary: (MyChart) Due to this being a telephonic visit, the after visit summary with patients personalized plan was offered to patient via MyChart   Notes: Nothing significant to report at this time.

## 2023-10-07 ENCOUNTER — Other Ambulatory Visit (INDEPENDENT_AMBULATORY_CARE_PROVIDER_SITE_OTHER): Payer: Self-pay

## 2023-10-07 ENCOUNTER — Encounter: Payer: Self-pay | Admitting: Orthopedic Surgery

## 2023-10-07 ENCOUNTER — Ambulatory Visit: Admitting: Orthopedic Surgery

## 2023-10-07 VITALS — BP 132/77 | HR 74 | Ht 69.0 in | Wt 173.0 lb

## 2023-10-07 DIAGNOSIS — M533 Sacrococcygeal disorders, not elsewhere classified: Secondary | ICD-10-CM

## 2023-10-07 NOTE — Progress Notes (Signed)
 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  Explained that offloading is the main treatment form.  It will take several months for this to get better.  Discussed that he should try and offloaded in any seated position including in the car.  Other options would be over-the-counter medications, PT, steroid injections.  I briefly talked about coccygectomy which is a procedure that can be done for coccyx pain.  I explained some of the drawbacks of it and that it has a high rate of infection and often patients still have pain. - After our conversation, patient wanted to work on offloading and try a steroid injection.  Referral provided to him today  - Can continue with Tylenol  up to 1000 mg 3 times a day -Patient should return to office in 6 weeks, x-rays at next visit: None   Patient expressed understanding of the plan and all questions were answered to the patient's satisfaction.   ___________________________________________________________________________   History:  Patient is a 73 y.o. male who presents today for coccyx pain.  Patient said he had a fall about 1 year ago and noted acute onset of pain in his tailbone.  He said it has been painful ever since.  He said has gotten worse over time.  He describes it as a burning pain.  He notes that when sitting down.  He says he feels a bump in the area of the pain.  He does not have any pain radiating to either lower extremity.   Weakness: Denies Symptoms of imbalance: Denies Paresthesias and numbness: Denies Bowel or bladder incontinence: Yes, has had urinary incontinence since his prostate surgery.  No recent changes in his bladder habits.  No bowel incontinence Saddle anesthesia: Denies  Treatments tried: Tylenol   Review of systems: Denies fevers and chills, night sweats, unexplained weight loss, history of cancer, pain that wakes them  at night  Past medical history: HTN HLD Prostate cancer in remission CHF GERD History of stroke Neuropathy Diabetes (last A1c was 5.9 on 07/19/2023) OSA Chronic back pain  Allergies: Zantac   Past surgical history:  Defibrillator placement Cataract surgery Polypectomy Prostatectomy  Social history: Denies use of nicotine product (smoking, vaping, patches, smokeless) Alcohol use: Denies Denies recreational drug use   Physical Exam:  BMI of 25.6  General: no acute distress, appears stated age Neurologic: alert, answering questions appropriately, following commands Respiratory: unlabored breathing on room air, symmetric chest rise Psychiatric: appropriate affect, normal cadence to speech   MSK (spine):  -Strength exam      Left  Right EHL    5/5  5/5 TA    5/5  5/5 GSC    5/5  5/5 Knee extension  5/5  5/5 Hip flexion   5/5  5/5  -Sensory exam    Sensation intact to light touch in L3-S1 nerve distributions of bilateral lower extremities  -Left hip exam: No pain through range of motion -Right hip exam: No pain to range of motion  -TTP over the coccyx, no other tenderness palpation over the lumbar spine  Imaging: XRs of the sacrum/coccyx from 10/07/2023 were independently reviewed and interpreted, showing significant kyphotic angulation to the coccyx.  No fracture or dislocation seen.   Patient name: [name] Patient MRN: [id number] Date of visit: 10/07/23

## 2023-10-11 ENCOUNTER — Ambulatory Visit: Admitting: Orthopedic Surgery

## 2023-10-15 ENCOUNTER — Encounter: Payer: Self-pay | Admitting: Internal Medicine

## 2023-10-15 ENCOUNTER — Ambulatory Visit: Admitting: Internal Medicine

## 2023-10-15 VITALS — BP 120/66 | HR 67 | Temp 98.4°F | Ht 69.0 in | Wt 169.0 lb

## 2023-10-15 DIAGNOSIS — I10 Essential (primary) hypertension: Secondary | ICD-10-CM | POA: Diagnosis not present

## 2023-10-15 DIAGNOSIS — R42 Dizziness and giddiness: Secondary | ICD-10-CM | POA: Diagnosis not present

## 2023-10-15 DIAGNOSIS — J069 Acute upper respiratory infection, unspecified: Secondary | ICD-10-CM | POA: Diagnosis not present

## 2023-10-15 MED ORDER — AMOXICILLIN-POT CLAVULANATE 875-125 MG PO TABS: 1.0000 | ORAL_TABLET | Freq: Two times a day (BID) | ORAL | 0 refills | Status: AC

## 2023-10-15 NOTE — Assessment & Plan Note (Signed)
 Chronic Blood pressure well controlled, but he is experiencing some lightheadedness-just started today ?  Related to cold versus starting to have some orthostatic hypotension secondary to weight loss Advised him to monitor BP at home and let me know if his lightheadedness persists Continue Coreg  25 mg twice daily, losartan  25 mg daily, sotalol  120 mg twice daily

## 2023-10-15 NOTE — Progress Notes (Signed)
 Subjective:    Patient ID: [name], male    DOB: 12-19-1950, 73 y.o.   MRN: [id number]      HPI [name] is here for  Chief Complaint  Patient presents with   Cough    Cough and congestion; Chest discomfort with coughing    He is here for an acute visit for cold symptoms.  His symptoms started at least 2 weeks ago  He is experiencing congestion, PND, sinus pressure, tightness in his chest, wheezing, shortness of breath, productive cough, lightheadedness/dizziness.  He denies any fever and headaches.  He does feel lightheadedness here.  They state this is the first day that he has experienced that.  He has tried taking coricidin products   He was here 3 months ago and has lost 10 pounds.  He denies eating any differently, and his wife states maybe he is eating less sweets.  He is not exercising.  Medications and allergies reviewed with patient and updated if appropriate.  Current Outpatient Medications on File Prior to Visit  Medication Sig Dispense Refill   ACCU-CHEK GUIDE test strip USE TO CHECK SUGAR 4 TIMES A DAY 100 strip 4   acetaminophen  (TYLENOL ) 500 MG tablet Take 1,000 mg by mouth every 6 (six) hours as needed for mild pain.     atorvastatin  (LIPITOR) 20 MG tablet TAKE 1 TABLET DAILY EXCEPT TAKE 1/2 TABLET ON TUESDAY, THURSDAY, AND SATURDAYS 72 tablet 3   carvedilol  (COREG ) 25 MG tablet Take 1 tablet (25 mg total) by mouth 2 (two) times daily with a meal. 180 tablet 3   Cholecalciferol (VITAMIN D) 50 MCG (2000 UT) tablet Take 2,000 Units by mouth daily.     clopidogrel  (PLAVIX ) 75 MG tablet TAKE 1 TABLET EVERY DAY 90 tablet 3   cyanocobalamin  (VITAMIN B12) 1000 MCG tablet TAKE 1 TABLET EVERY DAY (Patient taking differently: Take 1,000 mcg by mouth 3 (three) times a week.) 90 tablet 3   digoxin  (LANOXIN ) 0.125 MG tablet TAKE 1 TABLET EVERY DAY (Patient taking differently: Take 0.0625 mg by mouth daily.) 90 tablet 3   docusate sodium  (COLACE) 100 MG capsule  Take 1 capsule (100 mg total) by mouth 2 (two) times daily as needed for mild constipation. 60 capsule 2   donepezil  (ARICEPT ) 10 MG tablet Take 1 tablet (10 mg total) by mouth daily. 90 tablet 1   famotidine  (PEPCID ) 20 MG tablet TAKE 1 TABLET EVERY DAY 90 tablet 3   JANUMET  50-1000 MG tablet TAKE 1 TABLET EVERY DAY WITH A MEAL 90 tablet 3   Lancets (ONETOUCH ULTRASOFT) lancets Use as instructed to test sugar daily and prn. Dx E11.9 100 each 12   losartan  (COZAAR ) 25 MG tablet TAKE 1 TABLET AT BEDTIME 90 tablet 3   memantine  (NAMENDA ) 5 MG tablet TAKE 1 TABLET EVERY NIGHT 90 tablet 3   NON FORMULARY Pt uses a cpap nightly     OVER THE COUNTER MEDICATION Apply 1 Application topically as needed (pain). Nervine Cream     pantoprazole  (PROTONIX ) 40 MG tablet TAKE 1 TABLET EVERY DAY 90 tablet 3   SODIUM FLUORIDE 5000 SENSITIVE 1.1-5 % GEL Place 1 Application onto teeth daily.     sotalol  (BETAPACE ) 120 MG tablet TAKE 1 TABLET TWICE DAILY 180 tablet 3   No current facility-administered medications on file prior to visit.    Review of Systems  Constitutional:  Negative for appetite change and fever.  HENT:  Positive for congestion, postnasal drip and  sinus pressure. Negative for ear pain and sore throat.   Respiratory:  Positive for cough (productive), chest tightness, shortness of breath and wheezing.   Gastrointestinal:  Negative for diarrhea and nausea.  Musculoskeletal:  Positive for myalgias.  Neurological:  Positive for dizziness and light-headedness. Negative for headaches.       Objective:   Vitals:   10/15/23 1506  BP: 120/66  Pulse: 67  Temp: 98.4 F (36.9 C)  SpO2: 99%   BP Readings from Last 3 Encounters:  10/15/23 120/66  10/07/23 132/77  09/30/23 (!) 140/80   Wt Readings from Last 3 Encounters:  10/15/23 169 lb (76.7 kg)  10/07/23 173 lb (78.5 kg)  09/30/23 173 lb (78.5 kg)   Body mass index is 24.96 kg/m.    Physical Exam Constitutional:      General: He  is not in acute distress.    Appearance: Normal appearance. He is not ill-appearing.  HENT:     Head: Normocephalic.     Right Ear: Tympanic membrane, ear canal and external ear normal. There is no impacted cerumen.     Left Ear: Tympanic membrane, ear canal and external ear normal. There is no impacted cerumen.     Mouth/Throat:     Mouth: Mucous membranes are moist.     Pharynx: No oropharyngeal exudate or posterior oropharyngeal erythema.  Eyes:     Conjunctiva/sclera: Conjunctivae normal.  Cardiovascular:     Rate and Rhythm: Normal rate and regular rhythm.  Pulmonary:     Effort: Pulmonary effort is normal. No respiratory distress.     Breath sounds: Normal breath sounds. No wheezing or rales.  Musculoskeletal:     Cervical back: Neck supple. No tenderness.  Lymphadenopathy:     Cervical: No cervical adenopathy.  Skin:    General: Skin is warm and dry.     Findings: No rash.  Neurological:     Mental Status: He is alert.            Assessment & Plan:    See Problem List for Assessment and Plan of chronic medical problems.

## 2023-10-15 NOTE — Patient Instructions (Addendum)
      Medications changes include :   Augmentin  twice a day for 7 days      Return if symptoms worsen or fail to improve.

## 2023-10-15 NOTE — Assessment & Plan Note (Signed)
 New Has been experiencing some lightheadedness today-this is new Concerned whether this is related to weight loss and he is having some orthostatic hypotension for which related to his cold Blood pressure okay here today Advised him to monitor his BP at home, make sure he is getting good hydration and encouraged him to eat a little bit more Need to monitor weight Return if dizziness/lightheadedness persists

## 2023-10-15 NOTE — Assessment & Plan Note (Signed)
 Acute Cold symptoms for approximately 2 weeks Has a productive cough with some shortness of breath, wheezing and chest tightness Will start antibiotic-Augmentin  875-125 mg twice daily x 7 days Continue Coricidin Rest, fluids Call if symptoms or not improving

## 2023-10-16 ENCOUNTER — Encounter (HOSPITAL_BASED_OUTPATIENT_CLINIC_OR_DEPARTMENT_OTHER): Payer: Self-pay | Admitting: Pulmonary Disease

## 2023-10-17 NOTE — Telephone Encounter (Signed)
 Received the following message from patient's wife:   "[name] last saw you in Jan 2025. When he did his last sleep study he weighed 190. He now weighs 169. His highest weight was 240.  He has been diagnosed with memory loss. He wonders if he still needs to use the cpap."    Dr. Villa Greaser, can you please advise? Thanks!

## 2023-10-25 ENCOUNTER — Encounter: Payer: Self-pay | Admitting: Physician Assistant

## 2023-10-25 ENCOUNTER — Ambulatory Visit: Admitting: Physician Assistant

## 2023-10-25 VITALS — BP 115/70 | HR 85 | Resp 20 | Ht 69.0 in | Wt 170.0 lb

## 2023-10-25 DIAGNOSIS — F0280 Dementia in other diseases classified elsewhere without behavioral disturbance: Secondary | ICD-10-CM

## 2023-10-25 DIAGNOSIS — F0150 Vascular dementia without behavioral disturbance: Secondary | ICD-10-CM | POA: Diagnosis not present

## 2023-10-25 DIAGNOSIS — R413 Other amnesia: Secondary | ICD-10-CM

## 2023-10-25 DIAGNOSIS — G309 Alzheimer's disease, unspecified: Secondary | ICD-10-CM | POA: Diagnosis not present

## 2023-10-25 MED ORDER — MEMANTINE HCL 5 MG PO TABS: ORAL_TABLET | ORAL | 3 refills | Status: AC

## 2023-10-25 NOTE — Progress Notes (Signed)
 Remote ICD transmission.

## 2023-10-25 NOTE — Progress Notes (Signed)
 Assessment/Plan:    Mixed dementia of unclear etiology [name] is a very pleasant 73 y.o. RH male with a history of hypertension, ventricular tachycardia s/p MI , cardiomyopathy, chronic heart failure, aortic atherosclerosis, hyperlipidemia, Type II diabetes with polyneuropathy, and obstructive sleep apnea, B12 deficiency and a diagnosis of mixed dementia of unclear etiology, concern for vascular per neuropsych evaluation January 2025 seen today in follow up for memory loss.  Prior biomarkers did not yield a formal diagnosis although the patient may be at moderate risk for developing Alzheimer's disease.  We discussed ordering a FDG PET scan, patient and wife agree to proceed.  In the past, the patient had significant hallucinations, and there was concern for LBD, but now he is hallucinations have subsided and in today's visit, he does not demonstrate any tremors or other parkinsonian signs.  MMSE today is 21/30, stable from prior.  Patient is currently on donepezil  10 mg daily and memantine  5 mg nightly.  We discussed increasing memantine  to twice daily if tolerated, the goal would be to have memantine  10 mg twice daily, depending on his GI response..      Follow up in 6 months. At that visit, we will arrange a video visit to discuss the results of the FDG PET scan around October of this year. Proceed with metabolic PET scan to rule out Alzheimer's disease Continue donepezil  10 mg daily and memantine  5 mg, increased to 5 mg twice daily, goal of 10 mg twice daily.  Side effects discussed Recommend good control of her cardiovascular risk factors Continue to control mood as per PCP Monitor driving     Subjective:    This patient is accompanied in the office by his wife who supplements the history.  Previous records as well as any outside records available were reviewed prior to todays visit. Patient was last seen on 06/07/2023   Any changes in memory since last visit? About the  same-wife disagrees, stating the STM, directions and instructions, not understanding.     He has more difficulty operating the remote, the computer etc.  He is able to participate on his ADLs.  He spends his time with his grandchildren.  Likes to watch Westerns.  Repeats oneself?  Endorsed Disoriented when walking into a room? Denies    Leaving objects?  May misplace things but not in unusual places  Wandering behavior?  Denies.   Any personality changes since last visit?  Denies.   Any worsening depression?:  Denies.   Hallucinations or paranoia? Denies Seizures? denies    Any sleep changes? Sleeps well . A few vivid dreams, may talk in his sleep but not frequently. Sleep apnea?  Endorsed, no longer need it.  Any hygiene concerns? Denies.  Independent of bathing and dressing?  Endorsed  Does the patient needs help with medications?  Wife is in charge   Who is in charge of the finances?  Wife is in charge     Any changes in appetite?  His appetite is decreased.   Patient have trouble swallowing? Denies.   Does the patient cook? No Any headaches? Denies  Any vision changes? Denies  Chronic back pain coccyodynia after a fall, will need injections soon Ambulates with difficulty?  She has diabetic neuropathy, which may limit his mobility.  He cannot take gabapentin  due to interaction with his blood pressure medications.  Recent falls or head injuries? Denies.     Unilateral weakness, numbness or tingling?  He has diabetic neuropathy which  causes some imbalance Any tremors?  Denies   Any anosmia?  Denies   Any incontinence of urine?  Endorsed, wears a pad  Any bowel dysfunction?   Denies      Patient lives with his wife    Does the patient drive? A little, he may have some directional challenge     Neuropsych evaluation 05/2023  Briefly, results suggested diffuse and generally quite severe cognitive impairment. Said impairment was exhibited across processing speed, complex attention,  executive functioning, receptive language, confrontation naming, visuospatial abilities (outside of his figure copy), and essentially all aspects of learning and memory. Relative to his previous evaluations in December 2022 and December 2023, further decline was exhibited across essentially all aspects of memory. This was particularly noteworthy across yes/no recognition trials, as performance worsened across all tasks relative to his initial 2022 evaluation, suggesting a greater storage impairment. The underlying etiology continues to be difficult to pinpoint. 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 Neurologically speaking, the presence of an underlying neurodegenerative illness continues to appear likely. Differentiating this illness continues to be challenging due to largely diffuse cognitive impairment.  While underlying Alzheimer's disease may be most likely given memory performances and population base rates, I am unable to conclusively determine this as there remains much symptom overlap between conditions (e.g., this and Lewy body disease and/or vascular contributions). Continued medical monitoring will be important moving forward.   Plasma biomarkers January 2025:   APOE E3/E4   [id number]-[id number] 0.79 (normal )  ABETA 42 51  Comment: Reference Range: NOT ESTABLISHED  ABETA 40 316  Comment: Reference Range: NOT ESTABLISHED  ABETA 42/40 RATIO 0.161 (Intermediate risk)   PREVIOUS MEDICATIONS: Donepezil  23 mg daily (GI)  CURRENT MEDICATIONS:  Outpatient Encounter Medications as of 10/25/2023  Medication Sig   ACCU-CHEK GUIDE test strip USE TO CHECK SUGAR 4 TIMES A DAY   acetaminophen  (TYLENOL ) 500 MG tablet Take 1,000 mg by mouth every 6 (six) hours as needed for mild pain.   atorvastatin  (LIPITOR) 20 MG tablet TAKE 1 TABLET DAILY EXCEPT TAKE 1/2 TABLET ON TUESDAY, THURSDAY, AND SATURDAYS   carvedilol  (COREG ) 25 MG tablet Take 1 tablet (25 mg total) by  mouth 2 (two) times daily with a meal.   Cholecalciferol (VITAMIN D) 50 MCG (2000 UT) tablet Take 2,000 Units by mouth daily.   clopidogrel  (PLAVIX ) 75 MG tablet TAKE 1 TABLET EVERY DAY   cyanocobalamin  (VITAMIN B12) 1000 MCG tablet TAKE 1 TABLET EVERY DAY (Patient taking differently: Take 1,000 mcg by mouth 3 (three) times a week.)   digoxin  (LANOXIN ) 0.125 MG tablet TAKE 1 TABLET EVERY DAY (Patient taking differently: Take 0.0625 mg by mouth daily.)   docusate sodium  (COLACE) 100 MG capsule Take 1 capsule (100 mg total) by mouth 2 (two) times daily as needed for mild constipation.   donepezil  (ARICEPT ) 10 MG tablet Take 1 tablet (10 mg total) by mouth daily.   famotidine  (PEPCID ) 20 MG tablet TAKE 1 TABLET EVERY DAY   JANUMET  50-1000 MG tablet TAKE 1 TABLET EVERY DAY WITH A MEAL   Lancets (ONETOUCH ULTRASOFT) lancets Use as instructed to test sugar daily and prn. Dx E11.9   losartan  (COZAAR ) 25 MG tablet TAKE 1 TABLET AT BEDTIME   NON FORMULARY Pt uses a cpap nightly   OVER THE COUNTER MEDICATION Apply 1 Application topically as needed (pain). Nervine Cream   pantoprazole  (PROTONIX ) 40 MG tablet TAKE 1 TABLET EVERY DAY   SODIUM FLUORIDE  5000 SENSITIVE 1.1-5 % GEL Place 1 Application onto teeth daily.   sotalol  (BETAPACE ) 120 MG tablet TAKE 1 TABLET TWICE DAILY   [DISCONTINUED] memantine  (NAMENDA ) 5 MG tablet TAKE 1 TABLET EVERY NIGHT   memantine  (NAMENDA ) 5 MG tablet Take one tab twice a day   No facility-administered encounter medications on file as of 10/25/2023.       10/25/2023   12:00 PM 11/13/2022    9:00 AM 11/10/2021    6:00 PM  MMSE - Mini Mental State Exam  Orientation to time 1 2 5   Orientation to Place 5 3 5   Registration 3 3 3   Attention/ Calculation 3 2 5   Recall 1 3 3   Language- name 2 objects 2 2 2   Language- repeat 1 1 1   Language- follow 3 step command 3 3 3   Language- read & follow direction 1 1 1   Write a sentence 1 1 1   Copy design 0 0 1  Total score 21 21 30        12/05/2020   10:00 AM  Montreal Cognitive Assessment   Visuospatial/ Executive (0/5) 3  Naming (0/3) 2  Attention: Read list of digits (0/2) 2  Attention: Read list of letters (0/1) 1  Attention: Serial 7 subtraction starting at 100 (0/3) 1  Language: Repeat phrase (0/2) 0  Language : Fluency (0/1) 0  Abstraction (0/2) 0  Delayed Recall (0/5) 1  Orientation (0/6) 6  Total 16    Objective:     PHYSICAL EXAMINATION:    VITALS:   Vitals:   10/25/23 1044  BP: 115/70  Pulse: 85  Resp: 20  SpO2: 95%  Weight: 170 lb (77.1 kg)  Height: 5' 9 (1.753 m)    GEN:  The patient appears stated age and is in NAD. HEENT:  Normocephalic, atraumatic.   Neurological examination:  General: NAD, well-groomed, appears stated age. Orientation: The patient is alert. Oriented to person, place and not to date Cranial nerves: There is good facial symmetry.The speech is fluent and clear. No aphasia or dysarthria. Fund of knowledge is appropriate. Recent and remote memory are impaired. Attention and concentration are reduced. Able to name objects and repeat phrases.  Hearing is intact to conversational tone.   Sensation: Sensation is intact to light touch throughout Motor: Strength is at least antigravity x4. DTR's 2/4 in UE/LE     Movement examination: Tone: There is normal tone in the UE/LE Abnormal movements:  no tremor.  No myoclonus.  No asterixis.   Coordination:  There is no decremation with RAM's. Normal finger to nose  Gait and Station: The patient has no difficulty arising out of a deep-seated chair without the use of the hands. The patient's stride length is good.  Gait is cautious and narrow.    Thank you for allowing us  the opportunity to participate in the care of this nice patient. Please do not hesitate to contact us  for any questions or concerns.   Total time spent on today's visit was 45 minutes dedicated to this patient today, preparing to see patient, examining the  patient, ordering tests and/or medications and counseling the patient, documenting clinical information in the EHR or other health record, independently interpreting results and communicating results to the patient/family, discussing treatment and goals, answering patient's questions and coordinating care.  Cc:  Colene Dauphin, MD  Tex Filbert 10/25/2023 12:51 PM

## 2023-10-25 NOTE — Patient Instructions (Addendum)
 It was a pleasure to see you today at our office.   Recommendations:  Follow up video  Oct 20 at 1 pm to discuss the results of the PET Follow up in person Jan 8 11:30  Continue donepezil  10 mg daily  Continue memantine , increase to 5 mg twice a day    Whom to call:  Memory  decline, memory medications: Call our office [phone]   For psychiatric meds, mood meds: Please have your primary care physician manage these medications.       For assessment of decision of mental capacity and competency:  Call Dr. Laverne Potter, geriatric psychiatrist at [phone]  For guidance in geriatric dementia issues please call [facility] [phone]     If you have any severe symptoms of a stroke, or other severe issues such as confusion,severe chills or fever, etc call 911 or go to the ER as you may need to be evaluated further        RECOMMENDATIONS FOR ALL PATIENTS WITH MEMORY PROBLEMS: 1. Continue to exercise (Recommend 30 minutes of walking everyday, or 3 hours every week) 2. Increase social interactions - continue going to [place] and enjoy social gatherings with friends and family 3. Eat healthy, avoid fried foods and eat more fruits and vegetables 4. Maintain adequate blood pressure, blood sugar, and blood cholesterol level. Reducing the risk of stroke and cardiovascular disease also helps promoting better memory. 5. Avoid stressful situations. Live a simple life and avoid aggravations. Organize your time and prepare for the next day in anticipation. 6. Sleep well, avoid any interruptions of sleep and avoid any distractions in the bedroom that may interfere with adequate sleep quality 7. Avoid sugar, avoid sweets as there is a strong link between excessive sugar intake, diabetes, and cognitive impairment We discussed the Mediterranean diet, which has been shown to help patients reduce the risk of progressive memory disorders and reduces cardiovascular risk. This includes  eating fish, eat fruits and green leafy vegetables, nuts like almonds and hazelnuts, walnuts, and also use olive oil. Avoid fast foods and fried foods as much as possible. Avoid sweets and sugar as sugar use has been linked to worsening of memory function.  There is always a concern of gradual progression of memory problems. If this is the case, then we may need to adjust level of care according to patient needs. Support, both to the patient and caregiver, should then be put into place.    FALL PRECAUTIONS: Be cautious when walking. Scan the area for obstacles that may increase the risk of trips and falls. When getting up in the mornings, sit up at the edge of the bed for a few minutes before getting out of bed. Consider elevating the bed at the head end to avoid drop of blood pressure when getting up. Walk always in a well-lit room (use night lights in the walls). Avoid area rugs or power cords from appliances in the middle of the walkways. Use a walker or a cane if necessary and consider physical therapy for balance exercise. Get your eyesight checked regularly.  FINANCIAL OVERSIGHT: Supervision, especially oversight when making financial decisions or transactions is also recommended.  HOME SAFETY: Consider the safety of the kitchen when operating appliances like stoves, microwave oven, and blender. Consider having supervision and share cooking responsibilities until no longer able to participate in those. Accidents with firearms and other hazards in the house should be identified and addressed as well.   ABILITY TO BE LEFT  ALONE: If patient is unable to contact 911 operator, consider using Life[facility], or when the need is there, arrange for someone to stay with patients. Smoking is a fire hazard, consider supervision or cessation. Risk of wandering should be assessed by caregiver and if detected at any point, supervision and safe proof recommendations should be instituted.  MEDICATION SUPERVISION:  Inability to self-administer medication needs to be constantly addressed. Implement a mechanism to ensure safe administration of the medications.   DRIVING: Regarding driving, in patients with progressive memory problems, driving will be impaired. We advise to have someone else do the driving if trouble finding directions or if minor accidents are reported. Independent driving assessment is available to determine safety of driving.   If you are interested in the driving assessment, you can contact the following:  [employer] in [place] [phone]  [facility] [phone]  [facility] [phone]  [facility] [phone] or [phone]

## 2023-11-08 ENCOUNTER — Ambulatory Visit: Admitting: Physical Medicine and Rehabilitation

## 2023-11-08 ENCOUNTER — Other Ambulatory Visit: Payer: Self-pay

## 2023-11-08 VITALS — BP 138/82 | HR 66

## 2023-11-08 DIAGNOSIS — M533 Sacrococcygeal disorders, not elsewhere classified: Secondary | ICD-10-CM

## 2023-11-08 MED ORDER — BUPIVACAINE HCL 0.25 % IJ SOLN
4.0000 mL | INTRAMUSCULAR | Status: AC | PRN
  Administered 2023-11-08: 4 mL via INTRA_ARTICULAR

## 2023-11-08 MED ORDER — TRIAMCINOLONE ACETONIDE 40 MG/ML IJ SUSP
40.0000 mg | INTRAMUSCULAR | Status: AC | PRN
Start: 2023-11-08 — End: 2023-11-08
  Administered 2023-11-08: 40 mg via INTRA_ARTICULAR

## 2023-11-08 NOTE — Progress Notes (Signed)
 [name] - 73 y.o. male MRN [id number]  Date of birth: November 11, 1950  Office Visit Note: Visit Date: 11/08/2023 PCP: [name], MD Referred by: Georgina Ozell LABOR, MD  Subjective: Chief Complaint  Patient presents with   Lower Back - Pain   HPI:  [name] is a 73 y.o. male who comes in today at the request of Dr. Ozell Georgina for planned anesthetic coccyx (c1-2) injection with fluoroscopic guidance.  The patient has failed conservative care including home exercise, medications, time and activity modification.  This injection will be diagnostic and hopefully therapeutic.  Please see requesting physician notes for further details and justification. Noted during sterile prep that there is an area that may be an early pressure sore.   ROS Otherwise per HPI.  Assessment & Plan: Visit Diagnoses:    ICD-10-CM   1. Pain in the coccyx  M53.3 Large Joint Inj, Coccyx    XR C-ARM NO REPORT      Plan: No additional findings.   Meds & Orders: No orders of the defined types were placed in this encounter.   Orders Placed This Encounter  Procedures   Large Joint Inj, Coccyx   XR C-ARM NO REPORT    Follow-up: Return for visit to requesting provider as needed.   Procedures: Large Joint Inj, Coccyx on 11/08/2023 8:56 AM Indications: diagnostic evaluation and pain Details: 22 G 3.5 in needle, fluoroscopy-guided posterior approach  Arthrogram: No  Medications: 4 mL bupivacaine 0.25 %; 40 mg triamcinolone acetonide 40 MG/ML Outcome: tolerated well, no immediate complications Procedure, treatment alternatives, risks and benefits explained, specific risks discussed. Consent was given by the patient. Immediately prior to procedure a time out was called to verify the correct patient, procedure, equipment, support staff and site/side marked as required. Patient was prepped and draped in the usual sterile fashion.          Clinical History: No specialty comments available.      Objective:  VS:  HT:    WT:   BMI:     BP:138/82  HR:66bpm  TEMP: ( )  RESP:  Physical Exam Vitals and nursing note reviewed.  Constitutional:      General: He is not in acute distress.    Appearance: Normal appearance. He is well-developed.  HENT:     Head: Normocephalic and atraumatic.   Eyes:     Conjunctiva/sclera: Conjunctivae normal.     Pupils: Pupils are equal, round, and reactive to light.    Cardiovascular:     Rate and Rhythm: Normal rate.     Pulses: Normal pulses.     Heart sounds: Normal heart sounds.  Pulmonary:     Effort: Pulmonary effort is normal. No respiratory distress.   Musculoskeletal:     Cervical back: Normal range of motion and neck supple. No rigidity.     Right lower leg: No edema.     Left lower leg: No edema.   Skin:    General: Skin is warm and dry.     Findings: No erythema or rash.   Neurological:     General: No focal deficit present.     Mental Status: He is alert and oriented to person, place, and time.     Sensory: No sensory deficit.     Coordination: Coordination normal.     Gait: Gait normal.   Psychiatric:        Mood and Affect: Mood normal.  Behavior: Behavior normal.      Imaging: No results found.

## 2023-11-08 NOTE — Progress Notes (Signed)
 Pain Scale   Average Pain 8 Patient advising he has chronic Coccyx pain with out relief        +Driver, -BT, -Dye Allergies.

## 2023-11-08 NOTE — Patient Instructions (Signed)

## 2023-11-17 DIAGNOSIS — E119 Type 2 diabetes mellitus without complications: Secondary | ICD-10-CM | POA: Diagnosis not present

## 2023-11-17 DIAGNOSIS — H04123 Dry eye syndrome of bilateral lacrimal glands: Secondary | ICD-10-CM | POA: Diagnosis not present

## 2023-11-17 DIAGNOSIS — H26492 Other secondary cataract, left eye: Secondary | ICD-10-CM | POA: Diagnosis not present

## 2023-11-17 DIAGNOSIS — H40013 Open angle with borderline findings, low risk, bilateral: Secondary | ICD-10-CM | POA: Diagnosis not present

## 2023-11-17 DIAGNOSIS — H5703 Miosis: Secondary | ICD-10-CM | POA: Diagnosis not present

## 2023-11-17 DIAGNOSIS — H2511 Age-related nuclear cataract, right eye: Secondary | ICD-10-CM | POA: Diagnosis not present

## 2023-11-17 DIAGNOSIS — H43812 Vitreous degeneration, left eye: Secondary | ICD-10-CM | POA: Diagnosis not present

## 2023-11-17 DIAGNOSIS — H5347 Heteronymous bilateral field defects: Secondary | ICD-10-CM | POA: Diagnosis not present

## 2023-11-17 DIAGNOSIS — Z961 Presence of intraocular lens: Secondary | ICD-10-CM | POA: Diagnosis not present

## 2023-11-18 ENCOUNTER — Ambulatory Visit: Admitting: Orthopedic Surgery

## 2023-11-21 ENCOUNTER — Other Ambulatory Visit: Payer: Self-pay | Admitting: Physician Assistant

## 2023-11-22 ENCOUNTER — Ambulatory Visit: Admitting: Orthopedic Surgery

## 2023-11-22 DIAGNOSIS — M533 Sacrococcygeal disorders, not elsewhere classified: Secondary | ICD-10-CM

## 2023-11-22 NOTE — Progress Notes (Signed)
 Orthopedic Office Note  Patient returns today for follow-up on his coccydynia.  After last visit, he got an injection with Dr. Eldonna.  He said he got significant relief with that.  He said he is 70-80% better.  He has been trying to offload the area but finds it difficult as he requires padding multiple different seats including in his car.  On exam, he has tenderness over the coccyx.  He ambulates without assistive devices and has a nonantalgic gait.  He is in no acute distress.  No new imaging was obtained at today's visit.  Patient should continue to offload the area as much as possible.  I explained that this is the way to try to help the underlying problem.  I told him that if his pain does return, we could do a repeat injection but I would try to limit the amount of injections he gets.  No operative intervention at this time given that his symptoms have improved.  Patient should return to the office on an as-needed basis.  Ozell DELENA Ada, MD Orthopedic Surgeon

## 2023-11-23 ENCOUNTER — Telehealth: Payer: Self-pay

## 2023-11-23 DIAGNOSIS — H43812 Vitreous degeneration, left eye: Secondary | ICD-10-CM | POA: Diagnosis not present

## 2023-11-23 DIAGNOSIS — H2511 Age-related nuclear cataract, right eye: Secondary | ICD-10-CM | POA: Diagnosis not present

## 2023-11-23 DIAGNOSIS — H40013 Open angle with borderline findings, low risk, bilateral: Secondary | ICD-10-CM | POA: Diagnosis not present

## 2023-11-23 DIAGNOSIS — Z961 Presence of intraocular lens: Secondary | ICD-10-CM | POA: Diagnosis not present

## 2023-11-23 DIAGNOSIS — H5347 Heteronymous bilateral field defects: Secondary | ICD-10-CM | POA: Diagnosis not present

## 2023-11-23 DIAGNOSIS — H04123 Dry eye syndrome of bilateral lacrimal glands: Secondary | ICD-10-CM | POA: Diagnosis not present

## 2023-11-23 DIAGNOSIS — H26492 Other secondary cataract, left eye: Secondary | ICD-10-CM | POA: Diagnosis not present

## 2023-11-23 DIAGNOSIS — H5703 Miosis: Secondary | ICD-10-CM | POA: Diagnosis not present

## 2023-11-23 NOTE — Telephone Encounter (Signed)
 I called [employer] and sent clnicals to get pet scan approved it was denied. Please file an appeal Letter  denied [id number] tracking number [id number] for [place] Pet metaboli Brain cpt [phone].

## 2023-11-25 ENCOUNTER — Encounter: Payer: Self-pay | Admitting: Physician Assistant

## 2023-11-26 ENCOUNTER — Encounter: Payer: Self-pay | Admitting: Advanced Practice Midwife

## 2023-12-02 ENCOUNTER — Telehealth: Payer: Self-pay

## 2023-12-02 NOTE — Telephone Encounter (Signed)
 I contacted patient and wife advised of the outcome of denial, they will follow up in 6 months and in the mean time if they need anything they will call the office to discuss.

## 2023-12-02 NOTE — Telephone Encounter (Signed)
 [place] attempts with letter of appeal sent to insurance for FDG-PET scan denied again. Sending this to [facility] For next recommendations.

## 2023-12-07 ENCOUNTER — Encounter: Payer: Self-pay | Admitting: Internal Medicine

## 2023-12-07 ENCOUNTER — Ambulatory Visit: Payer: Medicare PPO | Admitting: Physician Assistant

## 2023-12-07 ENCOUNTER — Encounter: Payer: Self-pay | Admitting: Physician Assistant

## 2023-12-10 ENCOUNTER — Ambulatory Visit: Admitting: Internal Medicine

## 2023-12-16 ENCOUNTER — Ambulatory Visit: Payer: Self-pay | Admitting: Internal Medicine

## 2023-12-16 ENCOUNTER — Ambulatory Visit: Payer: Medicare PPO

## 2023-12-16 DIAGNOSIS — I428 Other cardiomyopathies: Secondary | ICD-10-CM | POA: Diagnosis not present

## 2023-12-16 LAB — CUP PACEART REMOTE DEVICE CHECK
Battery Remaining Longevity: 10 mo
Battery Voltage: 2.87 V
Brady Statistic RV Percent Paced: 0.04 %
Date Time Interrogation Session: 20250807052728
HighPow Impedance: 55 Ohm
Implantable Lead Connection Status: 753985
Implantable Lead Implant Date: 20140530
Implantable Lead Location: 753860
Implantable Lead Model: 6935
Implantable Pulse Generator Implant Date: 20140530
Lead Channel Impedance Value: 361 Ohm
Lead Channel Impedance Value: 456 Ohm
Lead Channel Pacing Threshold Amplitude: 0.625 V
Lead Channel Pacing Threshold Pulse Width: 0.4 ms
Lead Channel Sensing Intrinsic Amplitude: 7.125 mV
Lead Channel Sensing Intrinsic Amplitude: 7.125 mV
Lead Channel Setting Pacing Amplitude: 2.5 V
Lead Channel Setting Pacing Pulse Width: 0.4 ms
Lead Channel Setting Sensing Sensitivity: 0.3 mV

## 2023-12-24 ENCOUNTER — Ambulatory Visit: Admitting: Podiatry

## 2024-01-04 ENCOUNTER — Emergency Department (HOSPITAL_BASED_OUTPATIENT_CLINIC_OR_DEPARTMENT_OTHER)

## 2024-01-04 ENCOUNTER — Observation Stay (HOSPITAL_BASED_OUTPATIENT_CLINIC_OR_DEPARTMENT_OTHER)
Admission: EM | Admit: 2024-01-04 | Discharge: 2024-01-06 | Disposition: A | Discharge status: Home / Self Care | Attending: Internal Medicine | Admitting: Internal Medicine

## 2024-01-04 ENCOUNTER — Encounter: Payer: Self-pay | Admitting: Physician Assistant

## 2024-01-04 ENCOUNTER — Encounter: Payer: Self-pay | Admitting: Internal Medicine

## 2024-01-04 ENCOUNTER — Other Ambulatory Visit: Payer: Self-pay

## 2024-01-04 ENCOUNTER — Encounter (HOSPITAL_BASED_OUTPATIENT_CLINIC_OR_DEPARTMENT_OTHER): Payer: Self-pay

## 2024-01-04 ENCOUNTER — Ambulatory Visit: Payer: Self-pay

## 2024-01-04 DIAGNOSIS — E785 Hyperlipidemia, unspecified: Secondary | ICD-10-CM | POA: Diagnosis not present

## 2024-01-04 DIAGNOSIS — Z8673 Personal history of transient ischemic attack (TIA), and cerebral infarction without residual deficits: Secondary | ICD-10-CM | POA: Insufficient documentation

## 2024-01-04 DIAGNOSIS — E119 Type 2 diabetes mellitus without complications: Secondary | ICD-10-CM

## 2024-01-04 DIAGNOSIS — I110 Hypertensive heart disease with heart failure: Secondary | ICD-10-CM | POA: Diagnosis not present

## 2024-01-04 DIAGNOSIS — Z79899 Other long term (current) drug therapy: Secondary | ICD-10-CM | POA: Diagnosis not present

## 2024-01-04 DIAGNOSIS — Z95810 Presence of automatic (implantable) cardiac defibrillator: Secondary | ICD-10-CM | POA: Diagnosis present

## 2024-01-04 DIAGNOSIS — F0390 Unspecified dementia without behavioral disturbance: Secondary | ICD-10-CM | POA: Insufficient documentation

## 2024-01-04 DIAGNOSIS — I10 Essential (primary) hypertension: Secondary | ICD-10-CM | POA: Diagnosis present

## 2024-01-04 DIAGNOSIS — G4733 Obstructive sleep apnea (adult) (pediatric): Secondary | ICD-10-CM | POA: Diagnosis not present

## 2024-01-04 DIAGNOSIS — E1140 Type 2 diabetes mellitus with diabetic neuropathy, unspecified: Secondary | ICD-10-CM | POA: Insufficient documentation

## 2024-01-04 DIAGNOSIS — R42 Dizziness and giddiness: Secondary | ICD-10-CM | POA: Diagnosis not present

## 2024-01-04 DIAGNOSIS — I152 Hypertension secondary to endocrine disorders: Secondary | ICD-10-CM | POA: Diagnosis present

## 2024-01-04 DIAGNOSIS — I5022 Chronic systolic (congestive) heart failure: Secondary | ICD-10-CM | POA: Diagnosis not present

## 2024-01-04 DIAGNOSIS — E1169 Type 2 diabetes mellitus with other specified complication: Secondary | ICD-10-CM | POA: Diagnosis present

## 2024-01-04 LAB — URINALYSIS, ROUTINE W REFLEX MICROSCOPIC
Bilirubin Urine: NEGATIVE
Glucose, UA: NEGATIVE mg/dL
Hgb urine dipstick: NEGATIVE
Ketones, ur: 40 mg/dL — AB
Nitrite: NEGATIVE
Protein, ur: 30 mg/dL — AB
Specific Gravity, Urine: 1.02 (ref 1.005–1.030)
pH: 7 (ref 5.0–8.0)

## 2024-01-04 LAB — BASIC METABOLIC PANEL WITH GFR
Anion gap: 12 (ref 5–15)
BUN: 6 mg/dL — ABNORMAL LOW (ref 8–23)
CO2: 25 mmol/L (ref 22–32)
Calcium: 9.3 mg/dL (ref 8.9–10.3)
Chloride: 105 mmol/L (ref 98–111)
Creatinine, Ser: 0.86 mg/dL (ref 0.61–1.24)
GFR, Estimated: >60 mL/min (ref >60)
Glucose, Bld: 136 mg/dL — ABNORMAL HIGH (ref 70–99)
Potassium: 3.6 mmol/L (ref 3.5–5.1)
Sodium: 142 mmol/L (ref 135–145)

## 2024-01-04 LAB — URINALYSIS, MICROSCOPIC (REFLEX)

## 2024-01-04 LAB — CBC
HCT: 34.2 % — ABNORMAL LOW (ref 39.0–52.0)
Hemoglobin: 11 g/dL — ABNORMAL LOW (ref 13.0–17.0)
MCH: 23.3 pg — ABNORMAL LOW (ref 26.0–34.0)
MCHC: 32.2 g/dL (ref 30.0–36.0)
MCV: 72.5 fL — ABNORMAL LOW (ref 80.0–100.0)
Platelets: 210 K/uL (ref 150–400)
RBC: 4.72 MIL/uL (ref 4.22–5.81)
RDW: 15.9 % — ABNORMAL HIGH (ref 11.5–15.5)
WBC: 7 K/uL (ref 4.0–10.5)
nRBC: 0 % (ref 0.0–0.2)

## 2024-01-04 LAB — TROPONIN T, HIGH SENSITIVITY: Troponin T High Sensitivity: 15 ng/L (ref 0–19)

## 2024-01-04 LAB — MAGNESIUM: Magnesium: 2 mg/dL (ref 1.7–2.4)

## 2024-01-04 MED ORDER — ASPIRIN 81 MG PO CHEW
324.0000 mg | CHEWABLE_TABLET | Freq: Once | ORAL | Status: AC
  Administered 2024-01-04: 324 mg via ORAL
  Filled 2024-01-04: qty 4

## 2024-01-04 MED ORDER — DIAZEPAM 5 MG/ML IJ SOLN
2.5000 mg | Freq: Once | INTRAMUSCULAR | Status: AC
  Administered 2024-01-04: 2.5 mg via INTRAVENOUS
  Filled 2024-01-04: qty 2

## 2024-01-04 MED ORDER — MECLIZINE HCL 25 MG PO TABS
50.0000 mg | ORAL_TABLET | Freq: Once | ORAL | Status: AC
  Administered 2024-01-04: 50 mg via ORAL
  Filled 2024-01-04: qty 2

## 2024-01-04 MED ORDER — HYDRALAZINE HCL 20 MG/ML IJ SOLN
5.0000 mg | Freq: Once | INTRAMUSCULAR | Status: AC
  Administered 2024-01-04: 5 mg via INTRAVENOUS
  Filled 2024-01-04: qty 1

## 2024-01-04 MED ORDER — HYDRALAZINE HCL 20 MG/ML IJ SOLN
5.0000 mg | Freq: Once | INTRAMUSCULAR | Status: AC
  Administered 2024-01-04: 5 mg via INTRAVENOUS

## 2024-01-04 MED ORDER — IOHEXOL 350 MG/ML SOLN
100.0000 mL | Freq: Once | INTRAVENOUS | Status: AC | PRN
  Administered 2024-01-04: 75 mL via INTRAVENOUS

## 2024-01-04 MED ORDER — SODIUM CHLORIDE 0.9 % IV SOLN
1.0000 g | Freq: Once | INTRAVENOUS | Status: AC
  Administered 2024-01-04: 1 g via INTRAVENOUS
  Filled 2024-01-04: qty 10

## 2024-01-04 MED ORDER — SODIUM CHLORIDE 0.9 % IV BOLUS
500.0000 mL | Freq: Once | INTRAVENOUS | Status: AC
  Administered 2024-01-04: 500 mL via INTRAVENOUS

## 2024-01-04 MED ORDER — ONDANSETRON HCL 4 MG/2ML IJ SOLN
4.0000 mg | Freq: Once | INTRAMUSCULAR | Status: AC
  Administered 2024-01-04: 4 mg via INTRAVENOUS
  Filled 2024-01-04: qty 2

## 2024-01-04 NOTE — ED Provider Notes (Signed)
  Physical Exam  BP (!) 190/82   Pulse (!) 53   Temp 97.7 F (36.5 C) (Oral)   Resp 14   Ht 5' 9 (1.753 m)   Wt 79.4 kg   SpO2 97%   BMI 25.84 kg/m   Physical Exam  Procedures  Procedures  ED Course / MDM   Clinical Course as of 01/05/24 0819  Tue Jan 04, 2024  1440 HR 50-55 bpm on average and regular on telemetry [MT]  1457 Pt feeling better.  Signed out to Dr Dreama pending UA and reassessment amb function [MT]    Clinical Course User Index [MT] Trifan, Donnice PARAS, MD   Medical Decision Making Amount and/or Complexity of Data Reviewed Labs: ordered. Radiology: ordered.  Risk OTC drugs. Prescription drug management. Decision regarding hospitalization.  Received care of pt from Dr. Cottie. Please see his note for prior hx, physical and care. Briefly, this is a 73yo with history of AICD, htn, DHm hlpd, dementia, who has had one week of declining gait, with room spinning dizziness onset this AM.  Received meclizine  and feels a little better.  Plan for UA, ambulatory trial.   UA with possible UTI-given rocephin .  He is unable to ambulate.  Slight past pointing on and off on exam with LUE, however not clear signs of central etiology. CTA completed to evaluate for signs o ICH, occlusion and negative.  He is unable to have MRI due to ICD not being compatible.  At this point, consider CVA, versus intractable peripheral vertigo and/or UTI as well as hypertensive urgency.  Given difficulty ambulating, admitted to hospital with Neurology to consult.         Dreama Longs, MD 01/05/24 [phone]

## 2024-01-04 NOTE — ED Notes (Signed)
 Pt up to walk , still very dizzy and cannot  walk straight without assist x 2

## 2024-01-04 NOTE — ED Notes (Signed)
 Pt incontinent of urine- changed patient diaper with peri care provided.

## 2024-01-04 NOTE — Telephone Encounter (Signed)
 FYI Only or Action Required?: FYI only for provider.  Patient was last seen in primary care on 10/15/2023 by [name], MD.  Called Nurse Triage reporting Dizziness.  Symptoms began today.  Interventions attempted: Nothing.  Symptoms are: unchanged.  Triage Disposition: Go to ED Now (or PCP Triage)  Patient/caregiver understands and will follow disposition?: Yes                             Copied from CRM [id number]. Topic: Clinical - Red Word Triage >> Jan 04, 2024 11:50 AM Maisie BROCKS wrote: Red Word that prompted transfer to Nurse Triage: dizziness since last night, difficult to walk. Reason for Disposition  SEVERE dizziness (e.g., unable to stand, requires support to walk, feels like passing out now)  Answer Assessment - Initial Assessment Questions 1. DESCRIPTION: Describe your dizziness.     States it felt like things were moving early this morning 2. LIGHTHEADED: Do you feel lightheaded? (e.g., somewhat faint, woozy, weak upon standing)     Yes  3. VERTIGO: Do you feel like either you or the room is spinning or tilting? (i.e., vertigo)     Yes, I think I do 4. SEVERITY: How bad is it?  Do you feel like you are going to faint? Can you stand and walk?     States he had to crawl out of bed this morning, states he cannot walk right now 5. ONSET:  When did the dizziness begin?     Early this morning  6. AGGRAVATING FACTORS: Does anything make it worse? (e.g., standing, change in head position)     Turning head 8. CAUSE: What do you think is causing the dizziness? (e.g., decreased fluids or food, diarrhea, emotional distress, heat exposure, new medicine, sudden standing, vomiting; unknown)     Unsure 9. RECURRENT SYMPTOM: Have you had dizziness before? If Yes, ask: When was the last time? What happened that time?     States he experienced dizziness before due to a medication change 10. OTHER SYMPTOMS: Do you have any other  symptoms? (e.g., fever, chest pain, vomiting, diarrhea, bleeding)     Diarrhea, nausea, generalized fatigue/weakness, decreased appetite, denies headache, denies vision/speech changes, denies fever, denies falling, denies passing out    Advised ED. Patient verbalized understanding. Patient's wife is going to drive him.  Protocols used: Dizziness - Lightheadedness-A-AH

## 2024-01-04 NOTE — ED Provider Notes (Signed)
 [place] [facility] Provider Note   CSN: [id number] Arrival date & time: 01/04/24  1250     Patient presents with: Dizziness   [name] is a 73 y.o. male presenting to ED with complaint of vertigo.  Patient reports onset of symptoms last night when he got out of bed to use the bathroom.  He said he had sudden room spinning and difficulty getting back to the bed.  Denies loss of consciousness or head injury.  Denies headache.  He reports he has a similar episode of vertigo in the past many years ago but it resolved.  He denies any pain in his ears, ringing, ear fullness or loss of hearing.  Denies any recent fevers, chills, cough, congestion.  His wife at bedside reports the patient has had loose stools or diarrhea for about 5 to 7 days.  I reviewed his external records.  He has been seen by neurology in the past for diffuse cognitive dysfunction and decline in mental status.  He was also has been seen by cardiology with a history of chronic systolic heart failure status post ICD insertion ([employer] single chamber), history of V. tach and hypertension.   HPI     Prior to Admission medications   Medication Sig Start Date End Date Taking? Authorizing Provider  ACCU-CHEK GUIDE test strip USE TO CHECK SUGAR 4 TIMES A DAY 06/09/21   Burns, Glade PARAS, MD  acetaminophen  (TYLENOL ) 500 MG tablet Take 1,000 mg by mouth every 6 (six) hours as needed for mild pain.    [provider]  atorvastatin  (LIPITOR) 20 MG tablet TAKE 1 TABLET DAILY EXCEPT TAKE 1/2 TABLET ON TUESDAY, THURSDAY, AND SATURDAYS 02/17/23   Burns, Glade PARAS, MD  carvedilol  (COREG ) 25 MG tablet Take 1 tablet (25 mg total) by mouth 2 (two) times daily with a meal. 07/14/23   Waddell Danelle ORN, MD  Cholecalciferol  (VITAMIN D ) 50 MCG (2000 UT) tablet Take 2,000 Units by mouth daily.    [provider]  clopidogrel  (PLAVIX ) 75 MG tablet TAKE 1 TABLET EVERY DAY 11/16/22   Waddell Danelle ORN,  MD  cyanocobalamin  (VITAMIN B12) 1000 MCG tablet TAKE 1 TABLET EVERY DAY Patient taking differently: Take 1,000 mcg by mouth 3 (three) times a week. 08/17/22   [name], MD  digoxin  (LANOXIN ) 0.125 MG tablet TAKE 1 TABLET EVERY DAY Patient taking differently: Take 0.0625 mg by mouth daily. 10/19/22   [name], MD  docusate sodium  (COLACE) 100 MG capsule Take 1 capsule (100 mg total) by mouth 2 (two) times daily as needed for mild constipation. 05/30/21   [name], MD  donepezil  (ARICEPT ) 10 MG tablet TAKE 1 TABLET EVERY DAY 11/22/23   Dina, Sara E, PA-C  famotidine  (PEPCID ) 20 MG tablet TAKE 1 TABLET EVERY DAY 04/12/23   [name], MD  JANUMET  50-1000 MG tablet TAKE 1 TABLET EVERY DAY WITH A MEAL 03/15/23   Burns, Glade PARAS, MD  Lancets [facility] ULTRASOFT) lancets Use as instructed to test sugar daily and prn. Dx E11.9 01/16/17   [name], MD  losartan  (COZAAR ) 25 MG tablet TAKE 1 TABLET AT BEDTIME 09/28/23   Waddell Danelle ORN, MD  memantine  (NAMENDA ) 5 MG tablet Take one tab twice a day 10/25/23   Wertman, Sara E, PA-C  NON FORMULARY Pt uses a cpap nightly    [provider]  OVER THE COUNTER MEDICATION Apply 1 Application topically as needed (  pain). Nervine Cream    [provider]  pantoprazole  (PROTONIX ) 40 MG tablet TAKE 1 TABLET EVERY DAY 01/28/23   Burns, Glade PARAS, MD  SODIUM FLUORIDE 5000 SENSITIVE 1.1-5 % GEL Place 1 Application onto teeth daily. 03/11/23   [provider]  sotalol  (BETAPACE ) 120 MG tablet TAKE 1 TABLET TWICE DAILY 01/21/23   [name], MD    Allergies: Zantac  [ranitidine  hcl]    Review of Systems  Updated Vital Signs BP (!) 190/82   Pulse (!) 53   Temp 97.7 F (36.5 C) (Oral)   Resp 14   Ht 5' 9 (1.753 m)   Wt 79.4 kg   SpO2 97%   BMI 25.84 kg/m   Physical Exam Constitutional:      General: He is not in acute distress. HENT:     Head: Normocephalic and atraumatic.     Right Ear: Tympanic membrane and  ear canal normal.     Left Ear: Tympanic membrane and ear canal normal.  Eyes:     Conjunctiva/sclera: Conjunctivae normal.     Pupils: Pupils are equal, round, and reactive to light.  Cardiovascular:     Rate and Rhythm: Regular rhythm. Bradycardia present.  Pulmonary:     Effort: Pulmonary effort is normal. No respiratory distress.  Abdominal:     General: There is no distension.     Tenderness: There is no abdominal tenderness.  Skin:    General: Skin is warm and dry.  Neurological:     General: No focal deficit present.     Mental Status: He is alert. Mental status is at baseline.     Comments: Nystagmus with left lateral eye gaze  Psychiatric:        Mood and Affect: Mood normal.        Behavior: Behavior normal.     (all labs ordered are listed, but only abnormal results are displayed) Labs Reviewed  BASIC METABOLIC PANEL WITH GFR - Abnormal; Notable for the following components:      Result Value   Glucose, Bld 136 (*)    BUN 6 (*)    All other components within normal limits  CBC - Abnormal; Notable for the following components:   Hemoglobin 11.0 (*)    HCT 34.2 (*)    MCV 72.5 (*)    MCH 23.3 (*)    RDW 15.9 (*)    All other components within normal limits  MAGNESIUM  URINALYSIS, ROUTINE W REFLEX MICROSCOPIC  TROPONIN T, HIGH SENSITIVITY    EKG: EKG Interpretation Date/Time:  Tuesday January 04 2024 13:03:20 EDT Ventricular Rate:  47 PR Interval:  167 QRS Duration:  98 QT Interval:  424 QTC Calculation: 375 R Axis:   22  Text Interpretation: Sinus bradycardia Abnormal R-wave progression, early transition Probable left ventricular hypertrophy Confirmed by Cottie Cough [phone]) on 01/04/2024 1:04:41 PM  Radiology: No results found.   Procedures   Medications Ordered in the ED  meclizine  (ANTIVERT ) tablet 50 mg (50 mg Oral Given 01/04/24 1331)  ondansetron  (ZOFRAN ) injection 4 mg (4 mg Intravenous Given 01/04/24 1331)  sodium chloride  0.9 % bolus  500 mL (500 mLs Intravenous New Bag/Given 01/04/24 1331)    Clinical Course as of 01/04/24 1459  Tue Jan 04, 2024  1440 HR 50-55 bpm on average and regular on telemetry [MT]  1457 Pt feeling better.  Signed out to Dr Dreama pending UA and reassessment amb function [MT]    Clinical Course User Index [  MT] Cottie Donnice PARAS, MD                                 Medical Decision Making Amount and/or Complexity of Data Reviewed Labs: ordered.  Risk Prescription drug management.   Patient is here with acute onset of vertigo last night that began when he got out of bed.  This is most consistent with peripheral vertigo.  He does have unilateral segments with leftward gaze on exam.  Will give the patient meclizine  and Zofran  and a small fluid bolus.  He has had diarrhea for a few days, likely viral illness, we will check his electrolyte hydration status as well.  Low suspicion for stroke.  No indication for neuroimaging at this time.  Patient is not complaining of chest pain or difficulty breathing.  Supplemental history provided by the patient's wife at bedside  Prior medical records reviewed including cardiology evaluation and neurology evaluation.  I reviewed the patient's labs and imaging here, notable for no initial emergent findings.  Patient is pending UA at the time of signout  I ordered meclizine , IV Zofran  and fluids for suspected peripheral vertigo and hydration     Final diagnoses:  None    ED Discharge Orders     None          Cottie Donnice PARAS, MD 01/04/24 1459

## 2024-01-04 NOTE — ED Triage Notes (Signed)
 Patient here POV from Home.  Endorses waking up to use bathroom early this AM and waking up with dizziness and unsteadiness. Has had symptoms for approximately 1 week and worsened yesterday.   Notes diarrhea yesterday. Some nausea. No Emesis. No fevers. No pain. No SOB.   NAD noted during Triage. A&Ox4. GCS 15. Ambulatory.

## 2024-01-04 NOTE — ED Notes (Signed)
 ED TO INPATIENT HANDOFF REPORT  ED Nurse Name and Phone #: Sharlet Lemmings RN [phone]  S Name/Age/Gender [name] 73 y.o. male Room/Bed: MH10/MH10  Code Status   Code Status: Prior  Home/SNF/Other Home Patient oriented to: self and place Is this baseline? Yes   Triage Complete: Triage complete  Chief Complaint Vertigo [R42]  Triage Note Patient here POV from Home.  Endorses waking up to use bathroom early this AM and waking up with dizziness and unsteadiness. Has had symptoms for approximately 1 week and worsened yesterday.   Notes diarrhea yesterday. Some nausea. No Emesis. No fevers. No pain. No SOB.   NAD noted during Triage. A&Ox4. GCS 15. Ambulatory.    Allergies Allergies  Allergen Reactions   Zantac  [Ranitidine  Hcl] Itching    Level of Care/Admitting Diagnosis ED Disposition     ED Disposition  Admit   Condition  --   Comment  Hospital Area: [facility] [[id number]]  Level of Care: Telemetry Medical [104]  May admit patient to [facility] or [facility] if equivalent level of care is available:: Yes  Interfacility transfer: Yes  Covid Evaluation: Asymptomatic - no recent exposure (last 10 days) testing not required  Diagnosis: Vertigo [207257]  Admitting Physician: LENON MARIEN CROME [[id number]]  Attending Physician: LENON MARIEN CROME [[id number]]  Certification:: I certify this patient will need inpatient services for at least 2 midnights  Expected Medical Readiness: 01/06/2024          B Medical/Surgery History 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   Not compatible with MRI    Barrett's esophagus 09/05/2004   2 cm changes seen at index EGD   Cataract    Chronic back pain 04/25/2019   Chronic systolic heart failure    Colon polyps    tubular adenoma   Diabetic neuropathy 01/14/2022    Difficulty in walking 10/02/2021   Dizziness 01/19/2011   Dysrhythmia    Essential hypertension 08/26/2009   Fatigue 12/02/2020   Generalized anxiety disorder    since defibrillator placement   GERD (gastroesophageal reflux disease)    Hip pain 12/14/2016   History of COVID-19    History of myocardial infarction    Hyperlipidemia    Left knee pain 12/14/2016   Low back pain 05/14/2021   Malignant neoplasm of prostate 09/27/2014   s/p prostatectomy   Mild anemia 01/20/2011   Mixed dementia 04/27/2022   Neuralgia 10/19/2018   NICM (nonischemic cardiomyopathy)    a.normal cors by cath in 2005. b. s/p Medtronic ICD.   OSA (obstructive sleep apnea) 11/10/2012   regular CPAP use   Pain due to onychomycosis of toenails of both feet 02/23/2022   Pain in the coccyx 01/28/2022   Rectal pain 12/21/2021   Right foot pain 12/15/2017   RLS (restless legs syndrome) 11/29/2012   S/P radiation therapy    10/10/2014 through 11/26/2014; Prostate bed 6600 cGy in 33 sessions   Shingles 07/28/2016   TIA (transient ischemic attack) 01/05/2022   Type II diabetes mellitus 08/26/2009   Ventricular tachycardia    a. s/p ICD (generator change 09/2012). b. 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 and placed on sotalol    Visual hallucinations    Vitamin B12 deficiency 03/06/2011   Monthly B12 shots initiated  2011. Nasally  inhaled B12 as of 07/2012   Past Surgical History:  Procedure Laterality Date  CARDIAC DEFIBRILLATOR PLACEMENT  03/11/2004   Medtronic Maximo single lead   CATARACT EXTRACTION     COLONOSCOPY W/ BIOPSIES AND POLYPECTOMY  08/2004, 02/24/11   6mm adenoma in 2006,  5 mm polyp (not recovered) 2012   CYSTOSCOPY WITH DIRECT VISION INTERNAL URETHROTOMY N/A 10/08/2015   Procedure: CYSTOSCOPY WITH DIRECT VISION INTERNAL URETHROTOMY;  Surgeon: Redell Lynwood Napoleon, MD;  Location: [facility];  Service: Urology;  Laterality: N/A;   CYSTOSCOPY WITH URETHRAL DILATATION N/A 04/15/2023   Procedure: CYSTOSCOPY AND ROSELEE  WITH OPTILUME URETHRAL DILATATION;  Surgeon: Cam Morene ORN, MD;  Location: [facility];  Service: Urology;  Laterality: N/A;  60 MINUTES   IMPLANTABLE CARDIOVERTER DEFIBRILLATOR GENERATOR CHANGE N/A 10/07/2012   Procedure: IMPLANTABLE CARDIOVERTER DEFIBRILLATOR GENERATOR CHANGE;  Surgeon: Danelle ORN Birmingham, MD;  Location: [facility] CATH LAB;  Service: Cardiovascular;  Laterality: N/A;   PROSTATECTOMY  06/12/2003   Dr Aleene   RECTAL SURGERY     UPPER GASTROINTESTINAL ENDOSCOPY  08/2004, 02/24/11   Barrett's esophagus     A IV Location/Drains/Wounds Patient Lines/Drains/Airways Status     Active Line/Drains/Airways     Name Placement date Placement time Site Days   Peripheral IV 01/04/24 20 G Anterior;Left;Proximal Forearm 01/04/24  1327  Forearm  less than 1            Intake/Output Last 24 hours  Intake/Output Summary (Last 24 hours) at 01/04/2024 2207 Last data filed at 01/04/2024 1527 Gross per 24 hour  Intake 500 ml  Output --  Net 500 ml    Labs/Imaging Results for orders placed or performed during the hospital encounter of 01/04/24 (from the past 48 hours)  Troponin T, High Sensitivity     Status: None   Collection Time: 01/04/24  1:15 PM  Result Value Ref Range   Troponin T High Sensitivity 15 0 - 19 ng/L    Comment: (NOTE) Biotin concentrations > 1000 ng/mL falsely decrease TnT results.  Serial cardiac troponin measurements are suggested.  Refer to the Links section for chest pain algorithms and additional  guidance. Performed at [facility], [address] [place], [place] [address]   Basic metabolic panel     Status: Abnormal   Collection Time: 01/04/24  1:25 PM  Result Value Ref Range   Sodium 142 135 - 145 mmol/L   Potassium 3.6 3.5 - 5.1 mmol/L   Chloride 105 98 - 111 mmol/L   CO2 25 22 - 32 mmol/L   Glucose, Bld 136 (H) 70 - 99 mg/dL    Comment: Glucose reference range applies only to samples taken after fasting for at least 8 hours.   BUN 6 (L)  8 - 23 mg/dL   Creatinine, Ser 9.13 0.61 - 1.24 mg/dL   Calcium  9.3 8.9 - 10.3 mg/dL   GFR, Estimated >39 >39 mL/min    Comment: (NOTE) Calculated using the CKD-EPI Creatinine Equation (2021)    Anion gap 12 5 - 15    Comment: Performed at [facility], [address] [place], [place] [address]  CBC     Status: Abnormal   Collection Time: 01/04/24  1:25 PM  Result Value Ref Range   WBC 7.0 4.0 - 10.5 K/uL   RBC 4.72 4.22 - 5.81 MIL/uL   Hemoglobin 11.0 (L) 13.0 - 17.0 g/dL   HCT 65.7 (L) 60.9 - 47.9 %   MCV 72.5 (L) 80.0 - 100.0 fL   MCH 23.3 (L) 26.0 - 34.0 pg  MCHC 32.2 30.0 - 36.0 g/dL   RDW 84.0 (H) 88.4 - 84.4 %   Platelets 210 150 - 400 K/uL   nRBC 0.0 0.0 - 0.2 %    Comment: Performed at [facility], [address] [place], [place] [address]  Magnesium     Status: None   Collection Time: 01/04/24  1:25 PM  Result Value Ref Range   Magnesium 2.0 1.7 - 2.4 mg/dL    Comment: Performed at [facility] [address] [place], [place] [address]  Urinalysis, Routine w reflex microscopic -Urine, Clean Catch     Status: Abnormal   Collection Time: 01/04/24  1:26 PM  Result Value Ref Range   Color, Urine YELLOW YELLOW   APPearance CLEAR CLEAR   Specific Gravity, Urine 1.020 1.005 - 1.030   pH 7.0 5.0 - 8.0   Glucose, UA NEGATIVE NEGATIVE mg/dL   Hgb urine dipstick NEGATIVE NEGATIVE   Bilirubin Urine NEGATIVE NEGATIVE   Ketones, ur 40 (A) NEGATIVE mg/dL   Protein, ur 30 (A) NEGATIVE mg/dL   Nitrite NEGATIVE NEGATIVE   Leukocytes,Ua TRACE (A) NEGATIVE    Comment: Performed at [facility], [address] [place], [place] [address]  Urinalysis, Microscopic (reflex)     Status: Abnormal   Collection Time: 01/04/24  1:26 PM  Result Value Ref Range   RBC / HPF 0-5 0 - 5 RBC/hpf   WBC, UA 11-20 0 - 5 WBC/hpf   Bacteria, UA FEW (A) NONE SEEN   Squamous Epithelial / HPF 0-5 0 - 5 /HPF    Comment: Performed at [facility], [address] [place], [place] [address]   *Note: Due to a large number of results and/or encounters for the requested time period, some results have not been displayed. A complete set of results can be found in Results Review.   CT ANGIO HEAD NECK W WO CM Result Date: 01/04/2024 CLINICAL DATA:  Vertigo, central EXAM: CT ANGIOGRAPHY HEAD AND NECK WITH AND WITHOUT CONTRAST TECHNIQUE: Multidetector CT imaging of the head and neck was performed using the standard protocol during bolus administration of intravenous contrast. Multiplanar CT image reconstructions and MIPs were obtained to evaluate the vascular anatomy. Carotid stenosis measurements (when applicable) are obtained utilizing NASCET criteria, using the distal internal carotid diameter as the denominator. RADIATION DOSE REDUCTION: This exam was performed according to the departmental dose-optimization program which includes automated exposure control, adjustment of the mA and/or kV according to patient size and/or use of iterative reconstruction technique. CONTRAST:  75mL OMNIPAQUE  IOHEXOL  350 MG/ML SOLN COMPARISON:  None Available. FINDINGS: CT HEAD FINDINGS Brain: No evidence of acute large vascular territory infarction, hemorrhage, hydrocephalus, extra-axial collection or mass lesion/mass effect. Patchy white matter hypodensities, nonspecific but compatible with chronic microvascular ischemic disease. Vascular: See below. Skull: Normal. Negative for fracture or focal lesion. Sinuses/Orbits: Clear sinuses.  No acute orbital findings. Review of the MIP images confirms the above findings CTA NECK FINDINGS Aortic arch: Great vessel origins are patent without significant stenosis. Right carotid system: No evidence of dissection, stenosis (50% or greater), or occlusion. Left carotid system: No evidence of dissection, stenosis (50% or greater), or occlusion. Vertebral arteries: Right dominant. No evidence of dissection, stenosis (50% or greater),  or occlusion. Skeleton: No acute fracture on limited assessment. Other neck: No acute abnormality on limited assessment. Upper chest: Lung apices are clear. Review of the MIP images confirms the above  findings CTA HEAD FINDINGS Anterior circulation: Bilateral intracranial ICAs, MCAs, and ACAs are patent without proximal hemodynamically significant stenosis. Posterior circulation: Bilateral intradural vertebral arteries, basilar artery and bilateral posterior cerebral arteries are patent without proximal hemodynamically significant stenosis. Small/non dominant left intradural vertebral artery peers to largely terminate as PICA, anatomic variant. Left fetal type PCA, anatomic variant. Venous sinuses: As permitted by contrast timing, patent. Anatomic variants: As above. Review of the MIP images confirms the above findings IMPRESSION: 1. No evidence of acute intracranial abnormality. 2. No large vessel occlusion or proximal hemodynamically significant stenosis. Electronically Signed   By: Gilmore GORMAN Molt M.D.   On: 01/04/2024 16:53    Pending Labs Unresulted Labs (From admission, onward)    None       Vitals/Pain Today's Vitals   01/04/24 1900 01/04/24 1916 01/04/24 2100 01/04/24 2149  BP: (!) 155/80  135/76   Pulse: 78  70   Resp: (!) 21  17   Temp:    97.8 F (36.6 C)  TempSrc:    Oral  SpO2: 99% 98% 98%   Weight:      Height:        Isolation Precautions No active isolations  Medications Medications  meclizine  (ANTIVERT ) tablet 50 mg (50 mg Oral Given 01/04/24 1331)  ondansetron  (ZOFRAN ) injection 4 mg (4 mg Intravenous Given 01/04/24 1331)  sodium chloride  0.9 % bolus 500 mL (0 mLs Intravenous Stopped 01/04/24 1527)  diazepam  (VALIUM ) injection 2.5 mg (2.5 mg Intravenous Given 01/04/24 1525)  cefTRIAXone  (ROCEPHIN ) 1 g in sodium chloride  0.9 % 100 mL IVPB (0 g Intravenous Stopped 01/04/24 1642)  iohexol  (OMNIPAQUE ) 350 MG/ML injection 100 mL (75 mLs Intravenous Contrast Given 01/04/24  1546)  hydrALAZINE  (APRESOLINE ) injection 5 mg (5 mg Intravenous Given 01/04/24 1758)  aspirin  chewable tablet 324 mg (324 mg Oral Given 01/04/24 1758)  hydrALAZINE  (APRESOLINE ) injection 5 mg (5 mg Intravenous Given 01/04/24 1821)    Mobility walks with person assist     Focused Assessments  Patient treated for Vertigo . C/o dizziness with inability to ambulate starting yesterday evening per wife.     R Recommendations: See Admitting Provider Note  Report given to:   Additional Notes: Patient incontient of urine and stool. Patient wears depends.

## 2024-01-04 NOTE — ED Notes (Signed)
 Pt and wife given soup chx noodle and coke

## 2024-01-05 DIAGNOSIS — I110 Hypertensive heart disease with heart failure: Secondary | ICD-10-CM | POA: Diagnosis not present

## 2024-01-05 DIAGNOSIS — F0390 Unspecified dementia without behavioral disturbance: Secondary | ICD-10-CM | POA: Diagnosis not present

## 2024-01-05 DIAGNOSIS — R42 Dizziness and giddiness: Secondary | ICD-10-CM | POA: Diagnosis not present

## 2024-01-05 DIAGNOSIS — G4733 Obstructive sleep apnea (adult) (pediatric): Secondary | ICD-10-CM | POA: Diagnosis not present

## 2024-01-05 DIAGNOSIS — I159 Secondary hypertension, unspecified: Secondary | ICD-10-CM

## 2024-01-05 DIAGNOSIS — E1169 Type 2 diabetes mellitus with other specified complication: Secondary | ICD-10-CM

## 2024-01-05 DIAGNOSIS — I5020 Unspecified systolic (congestive) heart failure: Secondary | ICD-10-CM

## 2024-01-05 DIAGNOSIS — E1140 Type 2 diabetes mellitus with diabetic neuropathy, unspecified: Secondary | ICD-10-CM | POA: Diagnosis not present

## 2024-01-05 DIAGNOSIS — Z95810 Presence of automatic (implantable) cardiac defibrillator: Secondary | ICD-10-CM | POA: Diagnosis not present

## 2024-01-05 DIAGNOSIS — E785 Hyperlipidemia, unspecified: Secondary | ICD-10-CM | POA: Diagnosis not present

## 2024-01-05 DIAGNOSIS — Z8673 Personal history of transient ischemic attack (TIA), and cerebral infarction without residual deficits: Secondary | ICD-10-CM | POA: Diagnosis not present

## 2024-01-05 DIAGNOSIS — I5022 Chronic systolic (congestive) heart failure: Secondary | ICD-10-CM | POA: Diagnosis not present

## 2024-01-05 LAB — CBC
HCT: 34.2 % — ABNORMAL LOW (ref 39.0–52.0)
Hemoglobin: 10.8 g/dL — ABNORMAL LOW (ref 13.0–17.0)
MCH: 23.2 pg — ABNORMAL LOW (ref 26.0–34.0)
MCHC: 31.6 g/dL (ref 30.0–36.0)
MCV: 73.5 fL — ABNORMAL LOW (ref 80.0–100.0)
Platelets: 199 K/uL (ref 150–400)
RBC: 4.65 MIL/uL (ref 4.22–5.81)
RDW: 15.9 % — ABNORMAL HIGH (ref 11.5–15.5)
WBC: 5.7 K/uL (ref 4.0–10.5)
nRBC: 0 % (ref 0.0–0.2)

## 2024-01-05 LAB — GLUCOSE, CAPILLARY
Glucose-Capillary: 179 mg/dL — ABNORMAL HIGH (ref 70–99)
Glucose-Capillary: 59 mg/dL — ABNORMAL LOW (ref 70–99)
Glucose-Capillary: 134 mg/dL — ABNORMAL HIGH (ref 70–99)
Glucose-Capillary: 144 mg/dL — ABNORMAL HIGH (ref 70–99)

## 2024-01-05 LAB — CREATININE, SERUM
Creatinine, Ser: 0.84 mg/dL (ref 0.61–1.24)
GFR, Estimated: >60 mL/min (ref >60)

## 2024-01-05 LAB — DIGOXIN LEVEL: Digoxin Level: <0.6 ng/mL — ABNORMAL LOW (ref 0.8–2.0)

## 2024-01-05 MED ORDER — INSULIN ASPART 100 UNIT/ML IJ SOLN
0.0000 [IU] | Freq: Three times a day (TID) | INTRAMUSCULAR | Status: DC
  Administered 2024-01-05 – 2024-01-06 (×2): 2 [IU] via SUBCUTANEOUS

## 2024-01-05 MED ORDER — POTASSIUM CHLORIDE CRYS ER 20 MEQ PO TBCR
20.0000 meq | EXTENDED_RELEASE_TABLET | Freq: Once | ORAL | Status: AC
  Administered 2024-01-05: 20 meq via ORAL
  Filled 2024-01-05: qty 1

## 2024-01-05 MED ORDER — DONEPEZIL HCL 10 MG PO TABS
10.0000 mg | ORAL_TABLET | Freq: Every day | ORAL | Status: DC
Start: 2024-01-05 — End: 2024-01-06
  Administered 2024-01-05 – 2024-01-06 (×2): 10 mg via ORAL
  Filled 2024-01-05 (×2): qty 1

## 2024-01-05 MED ORDER — HYDRALAZINE HCL 20 MG/ML IJ SOLN: 10.0000 mg | Freq: Four times a day (QID) | INTRAMUSCULAR | Status: DC | PRN

## 2024-01-05 MED ORDER — ONDANSETRON HCL 4 MG/2ML IJ SOLN: 4.0000 mg | Freq: Four times a day (QID) | INTRAMUSCULAR | Status: DC | PRN

## 2024-01-05 MED ORDER — CARVEDILOL 25 MG PO TABS
25.0000 mg | ORAL_TABLET | Freq: Two times a day (BID) | ORAL | Status: DC
  Administered 2024-01-05: 25 mg via ORAL
  Filled 2024-01-05: qty 1

## 2024-01-05 MED ORDER — CARVEDILOL 6.25 MG PO TABS
6.2500 mg | ORAL_TABLET | Freq: Two times a day (BID) | ORAL | Status: DC
Start: 2024-01-05 — End: 2024-01-06
  Administered 2024-01-05: 6.25 mg via ORAL
  Filled 2024-01-05: qty 1

## 2024-01-05 MED ORDER — VITAMIN B-12 1000 MCG PO TABS
1000.0000 ug | ORAL_TABLET | ORAL | Status: DC
  Administered 2024-01-05: 1000 ug via ORAL
  Filled 2024-01-05: qty 1

## 2024-01-05 MED ORDER — MECLIZINE HCL 25 MG PO TABS
25.0000 mg | ORAL_TABLET | Freq: Three times a day (TID) | ORAL | Status: DC | PRN
  Filled 2024-01-05: qty 1

## 2024-01-05 MED ORDER — ENOXAPARIN SODIUM 40 MG/0.4ML IJ SOSY
40.0000 mg | PREFILLED_SYRINGE | INTRAMUSCULAR | Status: DC
  Administered 2024-01-05: 40 mg via SUBCUTANEOUS
  Filled 2024-01-05: qty 0.4

## 2024-01-05 MED ORDER — CLOPIDOGREL BISULFATE 75 MG PO TABS
75.0000 mg | ORAL_TABLET | Freq: Every day | ORAL | Status: DC
  Administered 2024-01-05 – 2024-01-06 (×2): 75 mg via ORAL
  Filled 2024-01-05 (×2): qty 1

## 2024-01-05 MED ORDER — SODIUM CHLORIDE 0.9 % IV SOLN: INTRAVENOUS | Status: AC

## 2024-01-05 MED ORDER — MEMANTINE HCL 10 MG PO TABS
5.0000 mg | ORAL_TABLET | Freq: Two times a day (BID) | ORAL | Status: DC
  Administered 2024-01-05 – 2024-01-06 (×3): 5 mg via ORAL
  Filled 2024-01-05 (×3): qty 1

## 2024-01-05 MED ORDER — ATORVASTATIN CALCIUM 10 MG PO TABS
20.0000 mg | ORAL_TABLET | Freq: Every day | ORAL | Status: DC
  Administered 2024-01-05 – 2024-01-06 (×2): 20 mg via ORAL
  Filled 2024-01-05 (×2): qty 2

## 2024-01-05 MED ORDER — DIGOXIN 125 MCG PO TABS
0.0625 mg | ORAL_TABLET | Freq: Every day | ORAL | Status: DC
Start: 2024-01-05 — End: 2024-01-06
  Administered 2024-01-05 – 2024-01-06 (×2): 0.0625 mg via ORAL
  Filled 2024-01-05 (×2): qty 1

## 2024-01-05 MED ORDER — VITAMIN D 25 MCG (1000 UNIT) PO TABS
2000.0000 [IU] | ORAL_TABLET | Freq: Every day | ORAL | Status: DC
  Administered 2024-01-05 – 2024-01-06 (×2): 2000 [IU] via ORAL
  Filled 2024-01-05 (×2): qty 2

## 2024-01-05 MED ORDER — DIAZEPAM 2 MG PO TABS: 2.0000 mg | ORAL_TABLET | Freq: Four times a day (QID) | ORAL | Status: DC | PRN

## 2024-01-05 MED ORDER — ALBUTEROL SULFATE (2.5 MG/3ML) 0.083% IN NEBU: 2.5000 mg | INHALATION_SOLUTION | RESPIRATORY_TRACT | Status: DC | PRN

## 2024-01-05 MED ORDER — SOTALOL HCL 120 MG PO TABS
120.0000 mg | ORAL_TABLET | Freq: Two times a day (BID) | ORAL | Status: DC
  Administered 2024-01-05 – 2024-01-06 (×3): 120 mg via ORAL
  Filled 2024-01-05 (×4): qty 1

## 2024-01-05 MED ORDER — LOSARTAN POTASSIUM 50 MG PO TABS
25.0000 mg | ORAL_TABLET | Freq: Every day | ORAL | Status: DC
  Administered 2024-01-05: 25 mg via ORAL
  Filled 2024-01-05: qty 1

## 2024-01-05 MED ORDER — PANTOPRAZOLE SODIUM 40 MG PO TBEC
40.0000 mg | DELAYED_RELEASE_TABLET | Freq: Every day | ORAL | Status: DC
  Administered 2024-01-05 – 2024-01-06 (×2): 40 mg via ORAL
  Filled 2024-01-05 (×2): qty 1

## 2024-01-05 MED ORDER — FAMOTIDINE 20 MG PO TABS
20.0000 mg | ORAL_TABLET | Freq: Every day | ORAL | Status: DC
  Administered 2024-01-05 – 2024-01-06 (×2): 20 mg via ORAL
  Filled 2024-01-05 (×2): qty 1

## 2024-01-05 NOTE — Evaluation (Signed)
 Occupational Therapy Evaluation Patient Details Name: ARIANA CAVENAUGH MRN: [id number] DOB: May 16, 1950 Today's Date: 01/05/2024   History of Present Illness   DEVIAN BARTOLOMEI is a 73 y.o. male who presented with vertigo   PMH: chronic HFrEF, VT-s/p ICD placement, CAD, HTN, dementia     Clinical Impressions Pt ind at baseline with ADLs and functional mobility, lives with spouse. Pt currently reporting mild dizziness with positional changes, elevated BP noted at end of session 178/91 (RN aware). Pt needs up to min A for ADLs, ind with bed mobility and CGA for transfers with RW. Pt with mild unsteadiness with turns but able to stand at sink CGA without UE support for oral care. Pt presenting with impairments listed below, will follow acutely. Recommend HHOT at d/c.     If plan is discharge home, recommend the following:   A little help with walking and/or transfers;A little help with bathing/dressing/bathroom;Assistance with cooking/housework;Direct supervision/assist for medications management;Direct supervision/assist for financial management;Assist for transportation;Help with stairs or ramp for entrance     Functional Status Assessment   Patient has had a recent decline in their functional status and demonstrates the ability to make significant improvements in function in a reasonable and predictable amount of time.     Equipment Recommendations   None recommended by OT (pt has all needed DME)     Recommendations for Other Services   PT consult     Precautions/Restrictions   Precautions Precautions: Fall Restrictions Weight Bearing Restrictions Per Provider Order: No     Mobility Bed Mobility Overal bed mobility: Independent                  Transfers Overall transfer level: Needs assistance Equipment used: Rolling walker (2 wheels) Transfers: Sit to/from Stand Sit to Stand: Contact guard assist                  Balance                                            ADL either performed or assessed with clinical judgement   ADL Overall ADL's : Needs assistance/impaired Eating/Feeding: Supervision/ safety   Grooming: Oral care;Contact guard assist   Upper Body Bathing: Contact guard assist;Sitting   Lower Body Bathing: Contact guard assist;Sit to/from stand;Sitting/lateral leans   Upper Body Dressing : Minimal assistance;Sitting;Standing   Lower Body Dressing: Minimal assistance;Sitting/lateral leans;Sit to/from stand   Toilet Transfer: Contact guard assist;Ambulation;Rolling walker (2 wheels);Regular Toilet   Toileting- Clothing Manipulation and Hygiene: Contact guard assist       Functional mobility during ADLs: Contact guard assist       Vision Baseline Vision/History: 1 Wears glasses Vision Assessment?: No apparent visual deficits     Perception Perception: Not tested       Praxis Praxis: Not tested       Pertinent Vitals/Pain Pain Assessment Pain Assessment: No/denies pain     Extremity/Trunk Assessment Upper Extremity Assessment Upper Extremity Assessment: Generalized weakness   Lower Extremity Assessment Lower Extremity Assessment: Defer to PT evaluation   Cervical / Trunk Assessment Cervical / Trunk Assessment: Normal   Communication Communication Communication: No apparent difficulties   Cognition Arousal: Alert Behavior During Therapy: WFL for tasks assessed/performed Cognition: No apparent impairments  Following commands: Intact       Cueing  General Comments      BP elevated 178/91 (116) at end of session RN aware   Exercises     Shoulder Instructions      Home Living Family/patient expects to be discharged to:: Private residence Living Arrangements: Spouse/significant other Available Help at Discharge: Family;Available 24 hours/day Type of Home: House Home Access: Stairs to enter [employer] of  Steps: 4 Entrance Stairs-Rails: Right;Left;Can reach both Home Layout: Two level;Able to live on main level with bedroom/bathroom Alternate Level Stairs-Number of Steps: flight   Bathroom Shower/Tub: Tub/shower unit;Walk-in shower   Bathroom Toilet: Handicapped height     Home Equipment: Agricultural consultant (2 wheels);BSC/3in1;Grab bars - toilet;Grab bars - tub/shower;Hand held shower head          Prior Functioning/Environment               Mobility Comments: Didnt use device, walks on his own but is unstable; no falls ADLs Comments: ind with ADL    OT Problem List: Decreased strength;Decreased range of motion;Decreased activity tolerance;Impaired balance (sitting and/or standing);Decreased cognition;Decreased safety awareness   OT Treatment/Interventions: Self-care/ADL training;Therapeutic exercise;Energy conservation;DME and/or AE instruction;Cognitive remediation/compensation;Therapeutic activities;Patient/family education;Balance training      OT Goals(Current goals can be found in the care plan section)   Acute Rehab OT Goals Patient Stated Goal: none stated OT Goal Formulation: With patient Time For Goal Achievement: 01/19/24 Potential to Achieve Goals: Good ADL Goals Pt Will Perform Upper Body Dressing: with modified independence;sitting Pt Will Perform Lower Body Dressing: with modified independence;sitting/lateral leans;sit to/from stand Pt Will Transfer to Toilet: with supervision;ambulating;regular height toilet Pt Will Perform Tub/Shower Transfer: Tub transfer;Shower transfer;with supervision;ambulating;3 in 1;rolling walker   OT Frequency:  Min 2X/week    Co-evaluation              AM-PAC OT 6 Clicks Daily Activity     Outcome Measure Help from another person eating meals?: A Little Help from another person taking care of personal grooming?: A Little Help from another person toileting, which includes using toliet, bedpan, or urinal?: A  Little Help from another person bathing (including washing, rinsing, drying)?: A Little Help from another person to put on and taking off regular upper body clothing?: A Little Help from another person to put on and taking off regular lower body clothing?: A Little 6 Click Score: 18   End of Session Equipment Utilized During Treatment: Gait belt;Rolling walker (2 wheels) Nurse Communication: Mobility status  Activity Tolerance: Patient tolerated treatment well Patient left: in bed;with call bell/phone within reach;with bed alarm set;with family/visitor present  OT Visit Diagnosis: Unsteadiness on feet (R26.81);Other abnormalities of gait and mobility (R26.89);Muscle weakness (generalized) (M62.81)                Time: 8546-8486 OT Time Calculation (min): 20 min Charges:  OT General Charges $OT Visit: 1 Visit OT Evaluation $OT Eval Low Complexity: 1 Low  Laneta POUR, OTD, OTR/L SecureChat Preferred Acute Rehab [phone]   Laneta POUR Koonce 01/05/2024, 3:19 PM

## 2024-01-05 NOTE — Plan of Care (Signed)

## 2024-01-05 NOTE — Progress Notes (Signed)
   01/05/24 2052  BiPAP/CPAP/SIPAP  Reason BIPAP/CPAP not in use Non-compliant (Pt states he has not been wearing his at home for over a month and does not wish to wear it here.)

## 2024-01-05 NOTE — Care Management Obs Status (Signed)
 MEDICARE OBSERVATION STATUS NOTIFICATION   Patient Details  Name: [name] MRN: [id number] Date of Birth: 1950/08/01   Medicare Observation Status Notification Given:  Yes    Marval Gell, RN 01/05/2024, 1:58 PM

## 2024-01-05 NOTE — H&P (Addendum)
 History and Physical    Patient: [name] FMW:[id number] DOB: Sep 27, 1950 DOA: 01/04/2024 DOS: the patient was seen and examined on 01/05/2024 PCP: [name], MD  Patient coming from: Home  Chief Complaint:  Chief Complaint  Patient presents with   Dizziness   HPI: [name] is a 73 y.o. male with medical history significant of chronic HFrEF, VT-s/p ICD placement, CAD, HTN, dementia-who presented with vertigo  Per patient/spouse at bedside-he was at his usual state of health-he woke up yesterday morning-and upon walking to the bathroom had new onset of vertigo (spinning sensation-not lightheadedness)-that made ambulation very difficult.  He was nauseous but no vomiting.  He had 1 episode of loose stools but no overt diarrhea.  He has been afebrile.  No fall or syncope.  Patient-his vertigo is slightly better today, although he is a poor historian-he does acknowledge some positional variation to vertigo-especially when sitting up/standing.  Since vertigo continued to persist-he presented to the ED-where CT head/CTA head/neck was negative.  He was thought to have peripheral vertigo and subsequently admitted to the hospitalist service.  No fever No chest pain No shortness of breath No vomiting/diarrhea + Nausea No abdominal pain No hematuria/hematochezia/melena.   Review of Systems: As mentioned in the history of present illness. All other systems reviewed and are negative. 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   Not compatible with MRI    Barrett's esophagus 09/05/2004   2 cm changes seen at index EGD   Cataract    Chronic back pain 04/25/2019   Chronic systolic heart failure    Colon polyps    tubular adenoma   Diabetic neuropathy 01/14/2022   Difficulty in walking 10/02/2021   Dizziness 01/19/2011   Dysrhythmia    Essential  hypertension 08/26/2009   Fatigue 12/02/2020   Generalized anxiety disorder    since defibrillator placement   GERD (gastroesophageal reflux disease)    Hip pain 12/14/2016   History of COVID-19    History of myocardial infarction    Hyperlipidemia    Left knee pain 12/14/2016   Low back pain 05/14/2021   Malignant neoplasm of prostate 09/27/2014   s/p prostatectomy   Mild anemia 01/20/2011   Mixed dementia 04/27/2022   Neuralgia 10/19/2018   NICM (nonischemic cardiomyopathy)    a.normal cors by cath in 2005. b. s/p Medtronic ICD.   OSA (obstructive sleep apnea) 11/10/2012   regular CPAP use   Pain due to onychomycosis of toenails of both feet 02/23/2022   Pain in the coccyx 01/28/2022   Rectal pain 12/21/2021   Right foot pain 12/15/2017   RLS (restless legs syndrome) 11/29/2012   S/P radiation therapy    10/10/2014 through 11/26/2014; Prostate bed 6600 cGy in 33 sessions   Shingles 07/28/2016   TIA (transient ischemic attack) 01/05/2022   Type II diabetes mellitus 08/26/2009   Ventricular tachycardia    a. s/p ICD (generator change 09/2012). b. 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 and placed on sotalol    Visual hallucinations    Vitamin B12 deficiency 03/06/2011   Monthly B12 shots initiated  2011. Nasally  inhaled B12 as of 07/2012   Past Surgical History:  Procedure Laterality Date   CARDIAC DEFIBRILLATOR PLACEMENT  03/11/2004   Medtronic Maximo single lead   CATARACT EXTRACTION     COLONOSCOPY W/ BIOPSIES AND POLYPECTOMY  08/2004, 02/24/11   6mm adenoma  in 2006,  5 mm polyp (not recovered) 2012   CYSTOSCOPY WITH DIRECT VISION INTERNAL URETHROTOMY N/A 10/08/2015   Procedure: CYSTOSCOPY WITH DIRECT VISION INTERNAL URETHROTOMY;  Surgeon: Redell Lynwood Napoleon, MD;  Location: [facility];  Service: Urology;  Laterality: N/A;   CYSTOSCOPY WITH URETHRAL DILATATION N/A 04/15/2023   Procedure: CYSTOSCOPY AND ROSELEE WITH OPTILUME URETHRAL DILATATION;  Surgeon: Cam Morene ORN, MD;  Location: [facility]  [facility];  Service: Urology;  Laterality: N/A;  60 MINUTES   IMPLANTABLE CARDIOVERTER DEFIBRILLATOR GENERATOR CHANGE N/A 10/07/2012   Procedure: IMPLANTABLE CARDIOVERTER DEFIBRILLATOR GENERATOR CHANGE;  Surgeon: Danelle ORN Birmingham, MD;  Location: [facility] CATH LAB;  Service: Cardiovascular;  Laterality: N/A;   PROSTATECTOMY  06/12/2003   Dr Aleene   RECTAL SURGERY     UPPER GASTROINTESTINAL ENDOSCOPY  08/2004, 02/24/11   Barrett's esophagus   Social History:  reports that he has never smoked. He has never used smokeless tobacco. He reports that he does not drink alcohol and does not use drugs.  Allergies  Allergen Reactions   Zantac  [Ranitidine  Hcl] Itching    Family History  Problem Relation Age of Onset   Dementia Mother    Hypertension Mother    Coronary artery disease Father    Prostate cancer Father    Diabetes Father    Dementia Sister    Dementia Brother    Diabetes Paternal Grandmother    Dementia Maternal Aunt    Stroke Neg Hx    Colon cancer Neg Hx    Colon polyps Neg Hx    Esophageal cancer Neg Hx    Rectal cancer Neg Hx    Stomach cancer Neg Hx     Prior to Admission medications   Medication Sig Start Date End Date Taking? Authorizing Provider  atorvastatin  (LIPITOR) 20 MG tablet TAKE 1 TABLET DAILY EXCEPT TAKE 1/2 TABLET ON TUESDAY, THURSDAY, AND SATURDAYS 02/17/23  Yes Burns, Glade PARAS, MD  carvedilol  (COREG ) 25 MG tablet Take 1 tablet (25 mg total) by mouth 2 (two) times daily with a meal. 07/14/23  Yes Birmingham Danelle ORN, MD  Cholecalciferol  (VITAMIN D ) 50 MCG (2000 UT) tablet Take 2,000 Units by mouth daily.   Yes [provider]  clopidogrel  (PLAVIX ) 75 MG tablet TAKE 1 TABLET EVERY DAY 11/16/22  Yes Birmingham Danelle ORN, MD  cyanocobalamin  (VITAMIN B12) 1000 MCG tablet TAKE 1 TABLET EVERY DAY Patient taking differently: Take 1,000 mcg by mouth 3 (three) times a week. 08/17/22  Yes Burns, Glade PARAS, MD  digoxin  (LANOXIN ) 0.125 MG tablet TAKE 1 TABLET EVERY DAY Patient taking  differently: Take 0.0625 mg by mouth daily. 10/19/22  Yes Burns, Glade PARAS, MD  donepezil  (ARICEPT ) 10 MG tablet TAKE 1 TABLET EVERY DAY 11/22/23  Yes Wertman, Sara E, PA-C  famotidine  (PEPCID ) 20 MG tablet TAKE 1 TABLET EVERY DAY 04/12/23  Yes Burns, Glade PARAS, MD  JANUMET  50-1000 MG tablet TAKE 1 TABLET EVERY DAY WITH A MEAL 03/15/23  Yes [name], MD  losartan  (COZAAR ) 25 MG tablet TAKE 1 TABLET AT BEDTIME 09/28/23  Yes Birmingham Danelle ORN, MD  memantine  (NAMENDA ) 5 MG tablet Take one tab twice a day 10/25/23  Yes Wertman, Sara E, PA-C  pantoprazole  (PROTONIX ) 40 MG tablet TAKE 1 TABLET EVERY DAY 01/28/23  Yes Burns, Glade PARAS, MD  sotalol  (BETAPACE ) 120 MG tablet TAKE 1 TABLET TWICE DAILY 01/21/23  Yes Burns, Glade PARAS, MD  ACCU-CHEK GUIDE test strip USE TO CHECK SUGAR 4 TIMES A DAY 06/09/21  [name], MD  acetaminophen  (TYLENOL ) 500 MG tablet Take 1,000 mg by mouth every 6 (six) hours as needed for mild pain.    [provider]  docusate sodium  (COLACE) 100 MG capsule Take 1 capsule (100 mg total) by mouth 2 (two) times daily as needed for mild constipation. 05/30/21   [name], MD  Lancets [facility] ULTRASOFT) lancets Use as instructed to test sugar daily and prn. Dx E11.9 01/16/17   [name], MD  NON FORMULARY Pt uses a cpap nightly    [provider]  OVER THE COUNTER MEDICATION Apply 1 Application topically as needed (pain). Nervine Cream    [provider]  SODIUM FLUORIDE 5000 SENSITIVE 1.1-5 % GEL Place 1 Application onto teeth daily. 03/11/23   [provider]    Physical Exam: Vitals:   01/04/24 2100 01/04/24 2149 01/04/24 2357 01/05/24 0400  BP: 135/76  (!) 157/78 128/79  Pulse: 70  69 60  Resp: 17  18 17   Temp:  97.8 F (36.6 C) 98.6 F (37 C) 98.3 F (36.8 C)  TempSrc:  Oral Oral Oral  SpO2: 98%  99% 98%  Weight:   72.6 kg   Height:   5' 11 (1.803 m)    Gen Exam:Alert awake-not in any distress HEENT:atraumatic,  normocephalic Chest: B/L clear to auscultation anteriorly CVS:S1S2 regular Abdomen:soft non tender, non distended Extremities:no edema Neurology: Non focal.  No nystagmus. Skin: no rash  Data Reviewed: {    Latest Ref Rng & Units 01/04/2024    1:25 PM 07/19/2023    2:01 PM 04/01/2023    9:03 AM  CBC  WBC 4.0 - 10.5 K/uL 7.0  4.2  4.8   Hemoglobin 13.0 - 17.0 g/dL 88.9  88.0  88.3   Hematocrit 39.0 - 52.0 % 34.2  37.4  36.3   Platelets 150 - 400 K/uL 210  226.0  200         Latest Ref Rng & Units 01/04/2024    1:25 PM 07/19/2023    2:01 PM 04/01/2023    9:03 AM  BMP  Glucose 70 - 99 mg/dL 863  894  837   BUN 8 - 23 mg/dL 6  11  12    Creatinine 0.61 - 1.24 mg/dL 9.13  8.93  9.02   Sodium 135 - 145 mmol/L 142  144  140   Potassium 3.5 - 5.1 mmol/L 3.6  4.4  3.7   Chloride 98 - 111 mmol/L 105  106  107   CO2 22 - 32 mmol/L 25  32  25   Calcium  8.9 - 10.3 mg/dL 9.3  9.7  9.0     CT head: No CVA CTA head/neck: No LVO  Twelve-lead EKG: Normal sinus rhythm  Assessment and Plan: Vertigo Somewhat positional-mostly when sitting up Possibly BPPV versus orthostatic hypotension Check orthostatics As needed meclizine  Vestibular PT evaluation Unable to obtain MRI brain due to ICD-but low suspicion for CVA.  Addendum Received report from PT that patient's orthostatic vital signs positive-also has vertiginous quality on their evaluation. Decrease Coreg  to 6.25 mg-gently hydrate with IV of for a few hours. Add compression stockings  Chronic HFrEF Euvolemic Continue Coreg /losartan /digoxin  Check digoxin  level  History of VT-s/p ICD Keep K> 4, Mg> 2 Sotalol  Telemetry monitoring  DM-2 Hold oral hypoglycemics Monitor CBGs on SSI  HTN Continue Coreg /losartan  Follow BP trend  History of TIA Continue Plavix /statin  OSA CPAP nightly  Dementia Appears to be mild/moderate Delirium  precautions Namenda .   Advance Care Planning:   Code Status: Full Code   Consults:  None  Family Communication: Spouse at bedside  Severity of Illness: The appropriate patient status for this patient is INPATIENT. Inpatient status is judged to be reasonable and necessary in order to provide the required intensity of service to ensure the patient's safety. The patient's presenting symptoms, physical exam findings, and initial radiographic and laboratory data in the context of their chronic comorbidities is felt to place them at high risk for further clinical deterioration. Furthermore, it is not anticipated that the patient will be medically stable for discharge from the hospital within 2 midnights of admission.   * I certify that at the point of admission it is my clinical judgment that the patient will require inpatient hospital care spanning beyond 2 midnights from the point of admission due to high intensity of service, high risk for further deterioration and high frequency of surveillance required.*  Author: Donalda Applebaum, MD 01/05/2024 8:07 AM  For on call review [email or web address].

## 2024-01-05 NOTE — Care Management CC44 (Signed)
 Condition Code 44 Documentation Completed  Patient Details  Name: TYLON KEMMERLING MRN: [id number] Date of Birth: 1950-09-22   Condition Code 44 given:  Yes Patient signature on Condition Code 44 notice:  Yes Documentation of 2 MD's agreement:  Yes Code 44 added to claim:  Yes    Marval Gell, RN 01/05/2024, 1:58 PM

## 2024-01-05 NOTE — Progress Notes (Signed)
   01/05/24 0914  TOC Brief Assessment  Insurance and Status Reviewed  Patient has primary care physician Yes  Home environment has been reviewed Single Family Home  Prior/Current Home Services No current home services  Social Drivers of Health Review SDOH reviewed no interventions necessary  Readmission risk has been reviewed Yes  Transition of care needs no transition of care needs at this time   Admitted from home w vertigo/ dizziness.  PT OT pending ICM will continue to follow

## 2024-01-05 NOTE — Evaluation (Addendum)
 Physical Therapy Evaluation Patient Details Name: [name] MRN: [id number] DOB: 07/16/1950 Today's Date: 01/05/2024  History of Present Illness  [name] is a 73 y.o. male who presented with vertigo   PMH: chronic HFrEF, VT-s/p ICD placement, CAD, HTN, dementia  Clinical Impression  Pt admitted with above diagnosis. Pt positive for right BPPV and treated with Epley maneuver.  Pt was also orthostatic. Notified MD and nurse.  Pt will need continued PT. Wife present and supportive. Pt would benefit from wheelchair for use in community and prn in home. Pt will also use RW in home and has a RW at home per pt/wife. Also HHPT recommended. Wife to pt at home.  Pt currently with functional limitations due to the deficits listed below (see PT Problem List). Pt will benefit from acute skilled PT to increase their independence and safety with mobility to allow discharge.           If plan is discharge home, recommend the following: A little help with walking and/or transfers;A little help with bathing/dressing/bathroom;Assistance with cooking/housework;Assist for transportation;Help with stairs or ramp for entrance   Can travel by private vehicle        Equipment Recommendations Wheelchair (measurements PT);Wheelchair cushion (measurements PT)  Recommendations for Other Services       Functional Status Assessment Patient has had a recent decline in their functional status and demonstrates the ability to make significant improvements in function in a reasonable and predictable amount of time.     Precautions / Restrictions Precautions Precautions: Fall Restrictions Weight Bearing Restrictions Per Provider Order: No      Mobility  Bed Mobility Overal bed mobility: Independent             General bed mobility comments: Tested pt for BPPV wtih positive right posterior canal BPPV and treated with Epley maneuver. Pt also orthostatic as well.  See BP below.  Brierly checked pt for  hypofunction and appears to have right hypofunction.    Transfers Overall transfer level: Needs assistance Equipment used: Rolling walker (2 wheels) Transfers: Sit to/from Stand Sit to Stand: From elevated surface, Min assist           General transfer comment: Pt stood to RW and took a few side steps to [facility] as he was orthostatic and decided not to walk due to this as pt reports dizziness with position changes.  Pt with LOB standing EOB with RW losting balance posteriorly.    Ambulation/Gait                  Stairs            Wheelchair Mobility     Tilt Bed    Modified Rankin (Stroke Patients Only)       Balance                                             Pertinent Vitals/Pain Pain Assessment Pain Assessment: No/denies pain    Home Living Family/patient expects to be discharged to:: Private residence Living Arrangements: Spouse/significant other Available Help at Discharge: Family;Available 24 hours/day (wife) Type of Home: House Home Access: Stairs to enter Entrance Stairs-Rails: Right;Left;Can reach both Entrance Stairs-Number of Steps: 4 Alternate Level Stairs-Number of Steps: floght Home Layout: Two level Home Equipment: Rolling Walker (2 wheels);BSC/3in1;Grab bars - toilet;Grab bars - tub/shower;Hand held shower head Additional  Comments: Pt had spinning that lasted hours.    Prior Function Prior Level of Function : Needs assist             Mobility Comments: Didnt use device, walks on his own but is unstable; no falls ADLs Comments: Pt does B/D, wife does meds and cooking     Extremity/Trunk Assessment   Upper Extremity Assessment Upper Extremity Assessment: Defer to OT evaluation    Lower Extremity Assessment Lower Extremity Assessment: Generalized weakness    Cervical / Trunk Assessment Cervical / Trunk Assessment: Normal  Communication   Communication Communication: No apparent difficulties     Cognition Arousal: Alert Behavior During Therapy: WFL for tasks assessed/performed   PT - Cognitive impairments: No apparent impairments                         Following commands: Intact       Cueing       General Comments General comments (skin integrity, edema, etc.): 65 bpm, 99% RA,179/85 initially supine; 68 bpm 154/97 sitting; 74 bpm, 121/77 standing; 79 bpm, 122/83 stadning after 3 minutes    Exercises     Assessment/Plan    PT Assessment Patient needs continued PT services  PT Problem List Decreased balance;Decreased activity tolerance;Decreased mobility;Decreased knowledge of use of DME;Decreased safety awareness;Decreased knowledge of precautions;Cardiopulmonary status limiting activity       PT Treatment Interventions DME instruction;Gait training;Functional mobility training;Therapeutic activities;Therapeutic exercise;Balance training;Patient/family education;Stair training    PT Goals (Current goals can be found in the Care Plan section)  Acute Rehab PT Goals Patient Stated Goal: to go home PT Goal Formulation: With patient Time For Goal Achievement: 01/19/24 Potential to Achieve Goals: Good    Frequency Min 2X/week     Co-evaluation               AM-PAC PT 6 Clicks Mobility  Outcome Measure Help needed turning from your back to your side while in a flat bed without using bedrails?: None Help needed moving from lying on your back to sitting on the side of a flat bed without using bedrails?: A Little Help needed moving to and from a bed to a chair (including a wheelchair)?: A Little Help needed standing up from a chair using your arms (e.g., wheelchair or bedside chair)?: A Little Help needed to walk in hospital room?: A Lot Help needed climbing 3-5 steps with a railing? : Total 6 Click Score: 16    End of Session Equipment Utilized During Treatment: Gait belt Activity Tolerance: Patient limited by fatigue (limited by  dizziness) Patient left: in bed;with call bell/phone within reach;with bed alarm set;with family/visitor present Nurse Communication: Mobility status PT Visit Diagnosis: Unsteadiness on feet (R26.81);Muscle weakness (generalized) (M62.81);Dizziness and giddiness (R42)    Time: 8950-8871 PT Time Calculation (min) (ACUTE ONLY): 39 min   Charges:   PT Evaluation $PT Eval Moderate Complexity: 1 Mod PT Treatments $Therapeutic Activity: 8-22 mins $Canalith Rep Proc: 8-22 mins PT General Charges $$ ACUTE PT VISIT: 1 Visit         Lendy Dittrich M,PT Acute Rehab Services [phone]   Stephane JULIANNA Bevel 01/05/2024, 12:32 PM

## 2024-01-05 NOTE — Progress Notes (Signed)
 PT CBG 59 ORANGE JUICE AND SNACK GIVEN. WILL RECHECK IN 30 MINUTES

## 2024-01-05 NOTE — TOC Initial Note (Signed)
 Transition of Care [facility]) - Initial/Assessment Note    Patient Details  Name: [name] MRN: [id number] Date of Birth: 19-Aug-1950  [facility]) CM/SW Contact:    Marval Gell, RN Phone Number: 01/05/2024, 2:13 PM  Clinical Narrative:                  Spoke w patient and wife at bedside. They would like HH services, preferred [facility] and they were able to accept. Patient has a RW at home. They are interested in seeing what the cost of a WC would be so I entered the order and asked the [employer] liaison to call the wife with a price. Will need to follow up with the wife to see if she would like it.   Expected Discharge Plan: Home w Home Health Services Barriers to Discharge: Continued Medical Work up   Patient Goals and CMS Choice Patient states their goals for this hospitalization and ongoing recovery are:: to go home CMS Medicare.gov Compare Post Acute Care list provided to:: Patient Choice offered to / list presented to : Patient      Expected Discharge Plan and Services   Discharge Planning Services: CM Consult Post Acute Care Choice: Home Health, Durable Medical Equipment Living arrangements for the past 2 months: Single Family Home                 DME Arranged: Community education officer wheelchair with seat cushion DME Agency: [employer] Date DME Agency Contacted: 01/05/24 Time DME Agency Contacted: 1413 Representative spoke with at DME Agency: [place] HH Arranged: PT, OT HH Agency: [facility] Date [facility] Agency Contacted: 01/05/24 Time HH Agency Contacted: 1413 Representative spoke with at [facility] Agency: [place]  Prior Living Arrangements/Services Living arrangements for the past 2 months: Single Family Home Lives with:: Spouse                   Activities of Daily Living   ADL Screening (condition at time of admission) Independently performs ADLs?: Yes (appropriate for developmental age) Is the patient deaf or have difficulty hearing?:  No Does the patient have difficulty seeing, even when wearing glasses/contacts?: No Does the patient have difficulty concentrating, remembering, or making decisions?: No  Permission Sought/Granted                  Emotional Assessment              Admission diagnosis:  Vertigo [R42] Patient Active Problem List   Diagnosis Date Noted   Vertigo 01/04/2024   Lightheadedness 10/15/2023   Gross hematuria 02/09/2023   Diarrhea 07/14/2022   Poor balance 07/14/2022   Lumbosacral radiculopathy at L5 07/14/2022   Mixed dementia 04/27/2022   Visual hallucinations    Pain due to onychomycosis of toenails of both feet 02/23/2022   Pain in the coccyx 01/28/2022   Diabetic neuropathy 01/14/2022   History of TIA (transient ischemic attack) 01/05/2022   Rectal pain 12/21/2021   Difficulty in walking 10/02/2021   URI (upper respiratory infection) 08/04/2021   Low back pain 05/14/2021   Generalized anxiety disorder 04/18/2021   Dysrhythmia 04/18/2021   History of myocardial infarction 04/18/2021   History of COVID-19    Aortic atherosclerosis 12/05/2020   Fatigue 12/02/2020   Shingles 07/28/2016   GERD (gastroesophageal reflux disease) 07/29/2015   Malignant neoplasm of prostate 09/27/2014   RLS (restless legs syndrome) 11/29/2012   OSA (obstructive sleep apnea) 11/10/2012   Vitamin B12 deficiency  03/06/2011   Chronic systolic heart failure 01/27/2011   Mild anemia 01/20/2011   Dizziness 01/19/2011   Ventricular tachycardia    NICM (nonischemic cardiomyopathy)    Type II diabetes mellitus 08/26/2009   Hyperlipidemia 08/26/2009   Essential hypertension 08/26/2009   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   Barrett's esophagus 09/05/2004   PCP:  [name], MD Pharmacy:   [facility] [address] [place] - [address] [place] [place] [address] Phone: [phone] Fax: [phone]  [facility] - [place], [place] - [address] [place] [address] Phone: [phone] Fax: [phone]     Social Drivers of Health (SDOH) Social History: SDOH Screenings   Food Insecurity: No Food Insecurity (01/05/2024)  Housing: Low Risk  (01/05/2024)  Transportation Needs: No Transportation Needs (01/05/2024)  Utilities: Not At Risk (01/05/2024)  Alcohol Screen: Low Risk  (09/30/2023)  Depression (PHQ2-9): Low Risk  (09/30/2023)  Financial Resource Strain: Low Risk  (09/30/2023)  Physical Activity: Inactive (09/30/2023)  Social Connections: Moderately Integrated (01/05/2024)  Stress: No Stress Concern Present (09/30/2023)  Tobacco Use: Low Risk  (01/04/2024)  Health Literacy: Adequate Health Literacy (09/30/2023)   SDOH Interventions:     Readmission Risk Interventions     No data to display

## 2024-01-06 ENCOUNTER — Other Ambulatory Visit (HOSPITAL_COMMUNITY): Payer: Self-pay

## 2024-01-06 DIAGNOSIS — H81399 Other peripheral vertigo, unspecified ear: Secondary | ICD-10-CM | POA: Diagnosis not present

## 2024-01-06 DIAGNOSIS — I10 Essential (primary) hypertension: Secondary | ICD-10-CM | POA: Diagnosis not present

## 2024-01-06 DIAGNOSIS — I5022 Chronic systolic (congestive) heart failure: Secondary | ICD-10-CM

## 2024-01-06 DIAGNOSIS — Z95810 Presence of automatic (implantable) cardiac defibrillator: Secondary | ICD-10-CM | POA: Diagnosis not present

## 2024-01-06 DIAGNOSIS — R42 Dizziness and giddiness: Secondary | ICD-10-CM | POA: Diagnosis not present

## 2024-01-06 LAB — BASIC METABOLIC PANEL WITH GFR
Anion gap: 8 (ref 5–15)
BUN: 7 mg/dL — ABNORMAL LOW (ref 8–23)
CO2: 28 mmol/L (ref 22–32)
Calcium: 9.2 mg/dL (ref 8.9–10.3)
Chloride: 108 mmol/L (ref 98–111)
Creatinine, Ser: 0.93 mg/dL (ref 0.61–1.24)
GFR, Estimated: >60 mL/min (ref >60)
Glucose, Bld: 101 mg/dL — ABNORMAL HIGH (ref 70–99)
Potassium: 3.5 mmol/L (ref 3.5–5.1)
Sodium: 144 mmol/L (ref 135–145)

## 2024-01-06 LAB — CBC
HCT: 35.8 % — ABNORMAL LOW (ref 39.0–52.0)
Hemoglobin: 11.3 g/dL — ABNORMAL LOW (ref 13.0–17.0)
MCH: 23.2 pg — ABNORMAL LOW (ref 26.0–34.0)
MCHC: 31.6 g/dL (ref 30.0–36.0)
MCV: 73.4 fL — ABNORMAL LOW (ref 80.0–100.0)
Platelets: 207 K/uL (ref 150–400)
RBC: 4.88 MIL/uL (ref 4.22–5.81)
RDW: 16 % — ABNORMAL HIGH (ref 11.5–15.5)
WBC: 6.2 K/uL (ref 4.0–10.5)
nRBC: 0 % (ref 0.0–0.2)

## 2024-01-06 LAB — GLUCOSE, CAPILLARY
Glucose-Capillary: 107 mg/dL — ABNORMAL HIGH (ref 70–99)
Glucose-Capillary: 173 mg/dL — ABNORMAL HIGH (ref 70–99)

## 2024-01-06 LAB — MAGNESIUM: Magnesium: 1.6 mg/dL — ABNORMAL LOW (ref 1.7–2.4)

## 2024-01-06 MED ORDER — HALOPERIDOL LACTATE 5 MG/ML IJ SOLN
1.0000 mg | Freq: Once | INTRAMUSCULAR | Status: AC | PRN
Start: 2024-01-06 — End: 2024-01-06
  Administered 2024-01-06: 1 mg via INTRAVENOUS
  Filled 2024-01-06: qty 1

## 2024-01-06 MED ORDER — CARVEDILOL 25 MG PO TABS
12.5000 mg | ORAL_TABLET | Freq: Two times a day (BID) | ORAL | 3 refills | Status: DC
  Filled 2024-01-06: qty 180, 180d supply, fill #0

## 2024-01-06 MED ORDER — MECLIZINE HCL 25 MG PO TABS
25.0000 mg | ORAL_TABLET | Freq: Three times a day (TID) | ORAL | 0 refills | Status: AC | PRN
  Filled 2024-01-06: qty 30, 10d supply, fill #0

## 2024-01-06 MED ORDER — CARVEDILOL 12.5 MG PO TABS
12.5000 mg | ORAL_TABLET | Freq: Two times a day (BID) | ORAL | Status: DC
  Administered 2024-01-06: 12.5 mg via ORAL
  Filled 2024-01-06: qty 1

## 2024-01-06 NOTE — Discharge Summary (Signed)
 PATIENT DETAILS Name: [name] Age: 73 y.o. Sex: male Date of Birth: 1950-07-20 MRN: [id number]. Admitting Physician: Jesse CHRISTELLA Applebaum, MD ERE:[name], Jesse PARAS, MD  Admit Date: 01/04/2024 Discharge date: 01/06/2024  Recommendations for Outpatient Follow-up:  Follow up with PCP in 1-2 weeks Please obtain CMP/CBC in one week  Admitted From:  Home  Disposition: Home health   Discharge Condition: good  CODE STATUS:   Code Status: Full Code   Diet recommendation:  Diet Order             Diet - low sodium heart healthy           Diet general           Diet heart healthy/carb modified Room service appropriate? Yes; Fluid consistency: Thin; Fluid restriction: 1500 mL Fluid  Diet effective now                    Brief Summary: [name] is a 72 y.o. male with medical history significant of chronic HFrEF, VT-s/p ICD placement, CAD, HTN, dementia-who presented with vertigo   Significant studies 8/26>> CTA head/neck: No acute intracranial abnormality-no LVO/significant stenosis  Consult Neurology  Brief Hospital Course: Vertigo Thought to be predominantly BPPV with some contribution from orthostatic hypotension Much better after vestibular PT eval Coreg  dosage decreased to 12.5 mg twice daily Continue compression stockings, lifestyle interventions/modifications for orthostatic hypotension discussed in detail. Reevaluated by PT today-able to ambulate-much improved-hardly any dizziness Stable for discharge.  Discussed with [name] will arrange for home health services.  Chronic HFrEF Euvolemic Continue Coreg /losartan /digoxin    History of VT-s/p ICD Sotalol    DM-2 CBGs stable Resume oral hypoglycemics   HTN Continue Coreg /losartan  see above regarding Coreg . Follow BP trend-but allow some amount of permissive hypertension   History of TIA Continue Plavix /statin   OSA CPAP nightly   Dementia Appears to be mild/moderate Delirium  precautions Namenda .  Discharge Diagnoses:  Principal Problem:   Vertigo Active Problems:   Type II diabetes mellitus   Hyperlipidemia   Essential hypertension   Automatic implantable cardioverter-defibrillator in situ   Chronic systolic heart failure   Discharge Instructions:  Activity:  As tolerated with Full fall precautions use walker/cane & assistance as needed   Discharge Instructions     Call MD for:  extreme fatigue   Complete by: As directed    Call MD for:  persistant dizziness or light-headedness   Complete by: As directed    Diet - low sodium heart healthy   Complete by: As directed    Diet general   Complete by: As directed    Discharge instructions   Complete by: As directed    Follow with Primary MD  Geofm Jesse PARAS, MD in 1-2 weeks  Please get a complete blood count and chemistry panel checked by your Primary MD at your next visit, and again as instructed by your Primary MD.  Get Medicines reviewed and adjusted: Please take all your medications with you for your next visit with your Primary MD  Laboratory/radiological data: Please request your Primary MD to go over all hospital tests and procedure/radiological results at the follow up, please ask your Primary MD to get all Hospital records sent to his/her office.  In some cases, they will be blood work, cultures and biopsy results pending at the time of your discharge. Please request that your primary care M.D. follows up on these results.  Also Note the following: If you experience worsening of  your admission symptoms, develop shortness of breath, life threatening emergency, suicidal or homicidal thoughts you must seek medical attention immediately by calling 911 or calling your MD immediately  if symptoms less severe.  You must read complete instructions/literature along with all the possible adverse reactions/side effects for all the Medicines you take and that have been prescribed to you. Take any new  Medicines after you have completely understood and accpet all the possible adverse reactions/side effects.   Do not drive when taking Pain medications or sleeping medications (Benzodaizepines)  Do not take more than prescribed Pain, Sleep and Anxiety Medications. It is not advisable to combine anxiety,sleep and pain medications without talking with your primary care practitioner  Special Instructions: If you have smoked or chewed Tobacco  in the last 2 yrs please stop smoking, stop any regular Alcohol  and or any Recreational drug use.  Wear Seat belts while driving.  Please note: You were cared for by a hospitalist during your hospital stay. Once you are discharged, your primary care physician will handle any further medical issues. Please note that NO REFILLS for any discharge medications will be authorized once you are discharged, as it is imperative that you return to your primary care physician (or establish a relationship with a primary care physician if you do not have one) for your post hospital discharge needs so that they can reassess your need for medications and monitor your lab values.   Non-Pharmacologic Reccomendations for Orthostatic Hypotension 1) Lifestyle modification. These measures include:             - Arising slowly, in stages, from supine to seated to standing. This maneuver is most important in the morning, when orthostatic tolerance is lowest.             - Avoiding straining, coughing, and walking in hot weather.These activities reduce venous return and worsen orthostatic hypotension.             - Maintaining hydration and avoiding over-heating.             - Raising the head of the bed 30 to 45 degrees decreases renal perfusion, thereby activating the renin-angiotensin-aldosterone system and decreasing nocturnal diuresis, which can be pronounced in these patients.  These changes relieve orthostatic hypotension by expanding extracellular fluid volume and may reduce end  organ damage by reducing supine HTN.             2) Exercise - walking 30 minutes a day, exercise in a swimming pool, exercise in a recumbent or seated position (using a stationary bike or rowing machine)             3) Abdominal binders              4) Compression Stockings             5) Increased salt and water  intake to 2 L to 2.5 L of water  a day             6) Modification of meals:             - Avoiding large meals             - Ingesting meals low in carbohydrates             - Alcohol should be avoided during the day as it is a vasodilator             - Drink water  with meals             -  Avoiding activities or sudden standing immediately after eating             7) Physical Countermaneuvers during daily activities: leg crossing, standing on tip toes, squatting   Increase activity slowly   Complete by: As directed       Allergies as of 01/06/2024       Reactions   Zantac  [ranitidine  Hcl] Itching        Medication List     TAKE these medications    acetaminophen  500 MG tablet Commonly known as: TYLENOL  Take 1,000 mg by mouth every 6 (six) hours as needed for mild pain.   atorvastatin  20 MG tablet Commonly known as: LIPITOR TAKE 1 TABLET DAILY EXCEPT TAKE 1/2 TABLET ON TUESDAY, THURSDAY, AND SATURDAYS   carvedilol  25 MG tablet Commonly known as: COREG  Take 0.5 tablets (12.5 mg total) by mouth 2 (two) times daily with a meal. What changed: how much to take   clopidogrel  75 MG tablet Commonly known as: PLAVIX  TAKE 1 TABLET EVERY DAY   cyanocobalamin  1000 MCG tablet Commonly known as: VITAMIN B12 TAKE 1 TABLET EVERY DAY What changed: when to take this   digoxin  0.125 MG tablet Commonly known as: LANOXIN  TAKE 1 TABLET EVERY DAY   docusate sodium  100 MG capsule Commonly known as: Colace Take 1 capsule (100 mg total) by mouth 2 (two) times daily as needed for mild constipation.   donepezil  10 MG tablet Commonly known as: ARICEPT  TAKE 1 TABLET EVERY  DAY   famotidine  20 MG tablet Commonly known as: PEPCID  TAKE 1 TABLET EVERY DAY   Janumet  50-1000 MG tablet Generic drug: sitaGLIPtin -metformin  TAKE 1 TABLET EVERY DAY WITH A MEAL   latanoprost 0.005 % ophthalmic solution Commonly known as: XALATAN Place 1 drop into both eyes at bedtime.   losartan  25 MG tablet Commonly known as: COZAAR  TAKE 1 TABLET AT BEDTIME   meclizine  25 MG tablet Commonly known as: ANTIVERT  Take 1 tablet (25 mg total) by mouth 3 (three) times daily as needed for dizziness.   memantine  5 MG tablet Commonly known as: NAMENDA  Take one tab twice a day   NON FORMULARY Pt uses a cpap nightly   OVER THE COUNTER MEDICATION Apply 1 Application topically as needed (pain). Nervine Cream   pantoprazole  40 MG tablet Commonly known as: PROTONIX  TAKE 1 TABLET EVERY DAY   Sodium Fluoride 5000 Sensitive 1.1-5 % Gel Generic drug: Sod Fluoride-Potassium Nitrate Place 1 Application onto teeth daily.   sotalol  120 MG tablet Commonly known as: BETAPACE  TAKE 1 TABLET TWICE DAILY   Vitamin D  50 MCG (2000 UT) tablet Take 2,000 Units by mouth daily.               Durable Medical Equipment  (From admission, onward)           Start     Ordered   01/05/24 1412  For home use only DME lightweight manual wheelchair with seat cushion  Once       Comments: Patient suffers from weakness which impairs their ability to perform daily activities like bathing in the home.  A cane will not resolve  issue with performing activities of daily living. A wheelchair will allow patient to safely perform daily activities. Patient is not able to propel themselves in the home using a standard weight wheelchair due to general weakness. Patient can self propel in the lightweight wheelchair. Length of need Lifetime. Accessories: elevating leg rests (ELRs), wheel locks, extensions and anti-tippers.  01/05/24 1412            Follow-up Information     [facility]. Go to .   Specialty: Emergency Medicine Why: If symptoms worsen Contact information: [address] [place]  [address] [phone]        Geofm Jesse PARAS, MD. Schedule an appointment as soon as possible for a visit in 3 days.   Specialty: Internal Medicine Contact information: [address] [place] [address] [phone]         [facility], [facility] Follow up.   Specialty: Home Health Services Why: for home health services Contact information: [address] [place] [address] [phone]                Allergies  Allergen Reactions   Zantac  [Ranitidine  Hcl] Itching     Other Procedures/Studies: CT ANGIO HEAD NECK W WO CM Result Date: 01/04/2024 CLINICAL DATA:  Vertigo, central EXAM: CT ANGIOGRAPHY HEAD AND NECK WITH AND WITHOUT CONTRAST TECHNIQUE: Multidetector CT imaging of the head and neck was performed using the standard protocol during bolus administration of intravenous contrast. Multiplanar CT image reconstructions and MIPs were obtained to evaluate the vascular anatomy. Carotid stenosis measurements (when applicable) are obtained utilizing NASCET criteria, using the distal internal carotid diameter as the denominator. RADIATION DOSE REDUCTION: This exam was performed according to the departmental dose-optimization program which includes automated exposure control, adjustment of the mA and/or kV according to patient size and/or use of iterative reconstruction technique. CONTRAST:  75mL OMNIPAQUE  IOHEXOL  350 MG/ML SOLN COMPARISON:  None Available. FINDINGS: CT HEAD FINDINGS Brain: No evidence of acute large vascular territory infarction, hemorrhage, hydrocephalus, extra-axial collection or mass lesion/mass effect. Patchy white matter hypodensities, nonspecific but compatible with chronic microvascular ischemic disease. Vascular: See below. Skull: Normal. Negative for fracture or  focal lesion. Sinuses/Orbits: Clear sinuses.  No acute orbital findings. Review of the MIP images confirms the above findings CTA NECK FINDINGS Aortic arch: Great vessel origins are patent without significant stenosis. Right carotid system: No evidence of dissection, stenosis (50% or greater), or occlusion. Left carotid system: No evidence of dissection, stenosis (50% or greater), or occlusion. Vertebral arteries: Right dominant. No evidence of dissection, stenosis (50% or greater), or occlusion. Skeleton: No acute fracture on limited assessment. Other neck: No acute abnormality on limited assessment. Upper chest: Lung apices are clear. Review of the MIP images confirms the above findings CTA HEAD FINDINGS Anterior circulation: Bilateral intracranial ICAs, MCAs, and ACAs are patent without proximal hemodynamically significant stenosis. Posterior circulation: Bilateral intradural vertebral arteries, basilar artery and bilateral posterior cerebral arteries are patent without proximal hemodynamically significant stenosis. Small/non dominant left intradural vertebral artery peers to largely terminate as PICA, anatomic variant. Left fetal type PCA, anatomic variant. Venous sinuses: As permitted by contrast timing, patent. Anatomic variants: As above. Review of the MIP images confirms the above findings IMPRESSION: 1. No evidence of acute intracranial abnormality. 2. No large vessel occlusion or proximal hemodynamically significant stenosis. Electronically Signed   By: Gilmore GORMAN Molt M.D.   On: 01/04/2024 16:53   CUP PACEART REMOTE DEVICE CHECK Result Date: 12/16/2023 ICD Scheduled remote reviewed. Normal device function.  Presenting rhythm: VS. Next remote 91 days. MC, CVRS    TODAY-DAY OF DISCHARGE:  Subjective:   Jeray Shugart today has no headache,no chest abdominal pain,no new weakness tingling or numbness, feels much better wants to go home today.   Objective:  Blood pressure 121/76, pulse 65,  temperature 98.2 F (36.8 C), temperature source Oral, resp. rate 11, height 5' 11 (1.803 m), weight 72.6 kg, SpO2 99%.  Intake/Output Summary (Last 24 hours) at 01/06/2024 1132 Last data filed at 01/06/2024 0733 Gross per 24 hour  Intake 11.37 ml  Output 625 ml  Net -613.63 ml   Filed Weights   01/04/24 1302 01/04/24 2357  Weight: 79.4 kg 72.6 kg    Exam: Awake Alert, Oriented *3, No new F.N deficits, Normal affect [place].AT,PERRAL Supple Neck,No JVD, No cervical lymphadenopathy appriciated.  Symmetrical Chest wall movement, Good air movement bilaterally, CTAB RRR,No Gallops,Rubs or new Murmurs, No Parasternal Heave +ve B.Sounds, Abd Soft, Non tender, No organomegaly appriciated, No rebound -guarding or rigidity. No Cyanosis, Clubbing or edema, No new Rash or bruise   PERTINENT RADIOLOGIC STUDIES: CT ANGIO HEAD NECK W WO CM Result Date: 01/04/2024 CLINICAL DATA:  Vertigo, central EXAM: CT ANGIOGRAPHY HEAD AND NECK WITH AND WITHOUT CONTRAST TECHNIQUE: Multidetector CT imaging of the head and neck was performed using the standard protocol during bolus administration of intravenous contrast. Multiplanar CT image reconstructions and MIPs were obtained to evaluate the vascular anatomy. Carotid stenosis measurements (when applicable) are obtained utilizing NASCET criteria, using the distal internal carotid diameter as the denominator. RADIATION DOSE REDUCTION: This exam was performed according to the departmental dose-optimization program which includes automated exposure control, adjustment of the mA and/or kV according to patient size and/or use of iterative reconstruction technique. CONTRAST:  75mL OMNIPAQUE  IOHEXOL  350 MG/ML SOLN COMPARISON:  None Available. FINDINGS: CT HEAD FINDINGS Brain: No evidence of acute large vascular territory infarction, hemorrhage, hydrocephalus, extra-axial collection or mass lesion/mass effect. Patchy white matter hypodensities, nonspecific but compatible with  chronic microvascular ischemic disease. Vascular: See below. Skull: Normal. Negative for fracture or focal lesion. Sinuses/Orbits: Clear sinuses.  No acute orbital findings. Review of the MIP images confirms the above findings CTA NECK FINDINGS Aortic arch: Great vessel origins are patent without significant stenosis. Right carotid system: No evidence of dissection, stenosis (50% or greater), or occlusion. Left carotid system: No evidence of dissection, stenosis (50% or greater), or occlusion. Vertebral arteries: Right dominant. No evidence of dissection, stenosis (50% or greater), or occlusion. Skeleton: No acute fracture on limited assessment. Other neck: No acute abnormality on limited assessment. Upper chest: Lung apices are clear. Review of the MIP images confirms the above findings CTA HEAD FINDINGS Anterior circulation: Bilateral intracranial ICAs, MCAs, and ACAs are patent without proximal hemodynamically significant stenosis. Posterior circulation: Bilateral intradural vertebral arteries, basilar artery and bilateral posterior cerebral arteries are patent without proximal hemodynamically significant stenosis. Small/non dominant left intradural vertebral artery peers to largely terminate as PICA, anatomic variant. Left fetal type PCA, anatomic variant. Venous sinuses: As permitted by contrast timing, patent. Anatomic variants: As above. Review of the MIP images confirms the above findings IMPRESSION: 1. No evidence of acute intracranial abnormality. 2. No large vessel occlusion or proximal hemodynamically significant stenosis. Electronically Signed   By: Gilmore GORMAN Molt M.D.   On: 01/04/2024 16:53     PERTINENT LAB RESULTS: CBC: Recent Labs    01/05/24 0841 01/06/24 0629  WBC 5.7 6.2  HGB 10.8* 11.3*  HCT 34.2* 35.8*  PLT 199 207   CMET CMP     Component Value Date/Time   NA 144 01/06/2024 0629   NA 144 12/02/2021 1530   K 3.5 01/06/2024 0629   CL 108 01/06/2024 0629   CO2 28  01/06/2024 0629  GLUCOSE 101 (H) 01/06/2024 0629   BUN 7 (L) 01/06/2024 0629   BUN 8 12/02/2021 1530   CREATININE 0.93 01/06/2024 0629   CREATININE 1.07 11/10/2012 1450   CALCIUM  9.2 01/06/2024 0629   PROT 7.5 07/19/2023 1401   ALBUMIN 4.3 07/19/2023 1401   AST 13 07/19/2023 1401   ALT 10 07/19/2023 1401   ALKPHOS 102 07/19/2023 1401   BILITOT 0.6 07/19/2023 1401   GFR 70.29 07/19/2023 1401   EGFR 75 12/02/2021 1530   GFRNONAA >60 01/06/2024 0629    GFR Estimated Creatinine Clearance: 73.7 mL/min (by C-G formula based on SCr of 0.93 mg/dL). No results for input(s): LIPASE, AMYLASE in the last 72 hours. No results for input(s): CKTOTAL, CKMB, CKMBINDEX, TROPONINI in the last 72 hours. Invalid input(s): POCBNP No results for input(s): DDIMER in the last 72 hours. No results for input(s): HGBA1C in the last 72 hours. No results for input(s): CHOL, HDL, LDLCALC, TRIG, CHOLHDL, LDLDIRECT in the last 72 hours. No results for input(s): TSH, T4TOTAL, T3FREE, THYROIDAB in the last 72 hours.  Invalid input(s): FREET3 No results for input(s): VITAMINB12, FOLATE, FERRITIN, TIBC, IRON, RETICCTPCT in the last 72 hours. Coags: No results for input(s): INR in the last 72 hours.  Invalid input(s): PT Microbiology: No results found for this or any previous visit (from the past 240 hours).  FURTHER DISCHARGE INSTRUCTIONS:  Get Medicines reviewed and adjusted: Please take all your medications with you for your next visit with your Primary MD  Laboratory/radiological data: Please request your Primary MD to go over all hospital tests and procedure/radiological results at the follow up, please ask your Primary MD to get all Hospital records sent to his/her office.  In some cases, they will be blood work, cultures and biopsy results pending at the time of your discharge. Please request that your primary care M.D. goes through all the  records of your hospital data and follows up on these results.  Also Note the following: If you experience worsening of your admission symptoms, develop shortness of breath, life threatening emergency, suicidal or homicidal thoughts you must seek medical attention immediately by calling 911 or calling your MD immediately  if symptoms less severe.  You must read complete instructions/literature along with all the possible adverse reactions/side effects for all the Medicines you take and that have been prescribed to you. Take any new Medicines after you have completely understood and accpet all the possible adverse reactions/side effects.   Do not drive when taking Pain medications or sleeping medications (Benzodaizepines)  Do not take more than prescribed Pain, Sleep and Anxiety Medications. It is not advisable to combine anxiety,sleep and pain medications without talking with your primary care practitioner  Special Instructions: If you have smoked or chewed Tobacco  in the last 2 yrs please stop smoking, stop any regular Alcohol  and or any Recreational drug use.  Wear Seat belts while driving.  Please note: You were cared for by a hospitalist during your hospital stay. Once you are discharged, your primary care physician will handle any further medical issues. Please note that NO REFILLS for any discharge medications will be authorized once you are discharged, as it is imperative that you return to your primary care physician (or establish a relationship with a primary care physician if you do not have one) for your post hospital discharge needs so that they can reassess your need for medications and monitor your lab values.  Total Time spent coordinating discharge including counseling, education  and face to face time equals greater than 30 minutes.  SignedBETHA [name] 01/06/2024 11:32 AM

## 2024-01-06 NOTE — Progress Notes (Signed)
 Physical Therapy Treatment Patient Details Name: [name] MRN: [id number] DOB: Jun 29, 1950 Today's Date: 01/06/2024   History of Present Illness [name] is a 73 y.o. male who presented with vertigo   PMH: chronic HFrEF, VT-s/p ICD placement, CAD, HTN, dementia    PT Comments  Pt admitted with above diagnosis. Pt was able to ambulate today and progress with cues to use compensation if dizziness occurs (pt reporting not feeling spinning dizziness with ambulation today but did feel off at times) and CGA with cues for RW use and safety with RW.  Wife educated in how to cue pt for vestibular issues as well as for safety with RW. Issued gait belt.  Also educated pt and wife regarding HEP and gave progression of HEP to pt and wife as well. BP was stable today during session (see BP below).  Pt currently with functional limitations due to the deficits listed below (see PT Problem List). Pt will benefit from acute skilled PT to increase their independence and safety with mobility to allow discharge.       If plan is discharge home, recommend the following: A little help with walking and/or transfers;A little help with bathing/dressing/bathroom;Assistance with cooking/housework;Assist for transportation;Help with stairs or ramp for entrance   Can travel by private vehicle        Equipment Recommendations  Wheelchair (measurements PT);Wheelchair cushion (measurements PT) (issued gait belt)    Recommendations for Other Services       Precautions / Restrictions Precautions Precautions: Fall Restrictions Weight Bearing Restrictions Per Provider Order: No     Mobility  Bed Mobility Overal bed mobility: Independent             General bed mobility comments: Tested pt for BPPV wtih positive left anterior canal BPPV and treated with Epley maneuver. BP more stable today with pt having abdominal binder and TED hose in place.  See BP below.    Transfers Overall transfer level: Needs  assistance Equipment used: Rolling walker (2 wheels) Transfers: Sit to/from Stand Sit to Stand: Contact guard assist           General transfer comment: Pt stood to RW with cues for hand placement.    Ambulation/Gait Ambulation/Gait assistance: Contact guard assist Gait Distance (Feet): 280 Feet Assistive device: Rolling walker (2 wheels) Gait Pattern/deviations: Step-through pattern, Decreased stride length, Decreased dorsiflexion - right, Drifts right/left   Gait velocity interpretation: 1.31 - 2.62 ft/sec, indicative of limited community ambulator   General Gait Details: Pt was able to progress ambulation to hallway with RW with CGA and no LOB but did need cues to stay close to RW and sequence steps at times.   Stairs             Wheelchair Mobility     Tilt Bed    Modified Rankin (Stroke Patients Only)       Balance                                            Communication Communication Communication: No apparent difficulties  Cognition Arousal: Alert Behavior During Therapy: WFL for tasks assessed/performed   PT - Cognitive impairments: No apparent impairments                         Following commands: Intact      Cueing  Exercises Other Exercises Other Exercises: Access Code: [email or web address]  URL: [email or web address]  Date: 01/06/2024  Prepared by: [name]    Exercises  - Self-Epley Maneuver Right Ear  - 2 x daily - 7 x weekly - 1 sets - 10 reps  - Self-Epley Maneuver Left Ear  - 2 x daily - 7 x weekly - 1 sets - 10 reps  - Brandt-Daroff Vestibular Exercise  - 2 x daily - 7 x weekly - 1 sets - 5 reps  - Rolling rightleft sides for vestibular habituation  - 2 x daily - 7 x weekly - 1 sets - 10 reps    Patient Education Other Exercises: Access Code: [email or web address]  URL: [email or web address]  Date: 01/06/2024  Prepared by: [name]    Exercises  - Seated Gaze Stabilization with Head Rotation  - 5 x daily - 7 x  weekly - 1 sets - 2 reps  - Seated Gaze Stabilization with Head Nod  - 5 x daily - 7 x weekly - 1 sets - 2 reps  - Standing Gaze Stabilization with Head Rotation  - 5 x daily - 7 x weekly - 1 sets - 2 reps  - Standing Gaze Stabilization with Head Nod  - 5 x daily - 7 x weekly - 1 sets - 2 reps  - Walking Gaze Stabilization Head Rotation  - 5 x daily - 7 x weekly - 1 sets - 2 reps Other Exercises: Educated wife regarding [name] and right Epley maneuver and HHPT can progress to other exercises once BPPV cleared.    General Comments General comments (skin integrity, edema, etc.): BP supine 63 bpm, 123/64; sitting 66 bpm, 129/85; standing 75 bpm, 97/84; standing after 3 min 73 bpm, 110/75.      Pertinent Vitals/Pain Pain Assessment Pain Assessment: No/denies pain    Home Living                          Prior Function            PT Goals (current goals can now be found in the care plan section) Acute Rehab PT Goals Patient Stated Goal: to go home Progress towards PT goals: Progressing toward goals    Frequency    Min 2X/week      PT Plan      Co-evaluation              AM-PAC PT 6 Clicks Mobility   Outcome Measure  Help needed turning from your back to your side while in a flat bed without using bedrails?: None Help needed moving from lying on your back to sitting on the side of a flat bed without using bedrails?: A Little Help needed moving to and from a bed to a chair (including a wheelchair)?: A Little Help needed standing up from a chair using your arms (e.g., wheelchair or bedside chair)?: A Little Help needed to walk in hospital room?: A Little Help needed climbing 3-5 steps with a railing? : Total 6 Click Score: 17    End of Session Equipment Utilized During Treatment: Gait belt Activity Tolerance: Patient limited by fatigue (limited by dizziness) Patient left: in bed;with call bell/phone within reach;with bed alarm set;with family/visitor  present Nurse Communication: Mobility status PT Visit Diagnosis: Unsteadiness on feet (R26.81);Muscle weakness (generalized) (M62.81);Dizziness and giddiness (R42)     Time: 9074-8981 PT Time Calculation (min) (ACUTE ONLY): 53 min  Charges:    $Gait Training: 8-22 mins $  Therapeutic Exercise: 23-37 mins $[place]alith Rep Proc: 8-22 mins PT General Charges $$ ACUTE PT VISIT: 1 Visit                     [facility] M,PT Acute Rehab Services [phone]    [name] JULIANNA Bevel 01/06/2024, 11:03 AM

## 2024-01-06 NOTE — TOC Progression Note (Signed)
 Transition of Care [facility]) - Progression Note    Patient Details  Name: [name] MRN: [id number] Date of Birth: 1950/07/22  [facility]) CM/SW Contact  Nola Devere Hands, RN Phone Number: 01/06/2024, 11:51 AM  Clinical Narrative:    Case Manager followed up with patient's wife concerning wheelchair. Also contacted Jermaine with [employer]. He states wheelchair will be delivered to patient's home today. No further needs identified.    Expected Discharge Plan: Home w Home Health Services Barriers to Discharge: No Barriers Identified               Expected Discharge Plan and Services   Discharge Planning Services: CM Consult Post Acute Care Choice: Home Health, Durable Medical Equipment Living arrangements for the past 2 months: Single Family Home Expected Discharge Date: 01/06/24               DME Arranged: Community education officer wheelchair with seat cushion DME Agency: [employer] Date DME Agency Contacted: 01/05/24 Time DME Agency Contacted: 1413 Representative spoke with at DME Agency: [place] HH Arranged: PT, OT HH Agency: [facility] Date [facility] Agency Contacted: 01/05/24 Time HH Agency Contacted: 1413 Representative spoke with at [facility] Agency: [place]   Social Drivers of Health (SDOH) Interventions SDOH Screenings   Food Insecurity: No Food Insecurity (01/05/2024)  Housing: Low Risk  (01/05/2024)  Transportation Needs: No Transportation Needs (01/05/2024)  Utilities: Not At Risk (01/05/2024)  Alcohol Screen: Low Risk  (09/30/2023)  Depression (PHQ2-9): Low Risk  (09/30/2023)  Financial Resource Strain: Low Risk  (09/30/2023)  Physical Activity: Inactive (09/30/2023)  Social Connections: Moderately Integrated (01/05/2024)  Stress: No Stress Concern Present (09/30/2023)  Tobacco Use: Low Risk  (01/04/2024)  Health Literacy: Adequate Health Literacy (09/30/2023)    Readmission Risk Interventions     No data to display

## 2024-01-06 NOTE — Plan of Care (Signed)
   Problem: Education: Goal: Knowledge of General Education information will improve Description: Including pain rating scale, medication(s)/side effects and non-pharmacologic comfort measures Outcome: Progressing   Problem: Health Behavior/Discharge Planning: Goal: Ability to manage health-related needs will improve Outcome: Progressing   Problem: Clinical Measurements: Goal: Ability to maintain clinical measurements within normal limits will improve Outcome: Progressing   Problem: Activity: Goal: Risk for activity intolerance will decrease Outcome: Progressing   Problem: Coping: Goal: Level of anxiety will decrease Outcome: Progressing   Problem: Safety: Goal: Ability to remain free from injury will improve Outcome: Progressing

## 2024-01-06 NOTE — Consult Note (Signed)
 NEUROLOGY CONSULT NOTE   Date of service: January 06, 2024 Patient Name: [name] MRN:  [id number] DOB:  Dec 26, 1950 Chief Complaint: Vertigo Requesting Provider: Raenelle Donalda HERO, MD  History of Present Illness  [name] is a 73 y.o. male with hx of VT s/p ICD, CAD, HTN, dementia, GERD, who presents with vertigo.  He woke up with vertigo on 01/04/24 in AM. It is intermittent. Goes away when he is not moving his head. Triggered by head movements or trying to sit of stand up.  Drinks a lot of pepsi and no water .  Has baseline dementia. He had vertigo 5-6 years ago.  Vertigo is improved. No vertigo when he is resting.    ROS  Comprehensive ROS performed and pertinent positives documented in HPI   Past History   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   Not compatible with MRI    Barrett's esophagus 09/05/2004   2 cm changes seen at index EGD   Cataract    Chronic back pain 04/25/2019   Chronic systolic heart failure    Colon polyps    tubular adenoma   Diabetic neuropathy 01/14/2022   Difficulty in walking 10/02/2021   Dizziness 01/19/2011   Dysrhythmia    Essential hypertension 08/26/2009   Fatigue 12/02/2020   Generalized anxiety disorder    since defibrillator placement   GERD (gastroesophageal reflux disease)    Hip pain 12/14/2016   History of COVID-19    History of myocardial infarction    Hyperlipidemia    Left knee pain 12/14/2016   Low back pain 05/14/2021   Malignant neoplasm of prostate 09/27/2014   s/p prostatectomy   Mild anemia 01/20/2011   Mixed dementia 04/27/2022   Neuralgia 10/19/2018   NICM (nonischemic cardiomyopathy)    a.normal cors by cath in 2005. b. s/p Medtronic ICD.   OSA (obstructive sleep apnea) 11/10/2012   regular CPAP use   Pain due to onychomycosis of toenails of both feet 02/23/2022    Pain in the coccyx 01/28/2022   Rectal pain 12/21/2021   Right foot pain 12/15/2017   RLS (restless legs syndrome) 11/29/2012   S/P radiation therapy    10/10/2014 through 11/26/2014; Prostate bed 6600 cGy in 33 sessions   Shingles 07/28/2016   TIA (transient ischemic attack) 01/05/2022   Type II diabetes mellitus 08/26/2009   Ventricular tachycardia    a. s/p ICD (generator change 09/2012). b. 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 and placed on sotalol    Visual hallucinations    Vitamin B12 deficiency 03/06/2011   Monthly B12 shots initiated  2011. Nasally  inhaled B12 as of 07/2012    Past Surgical History:  Procedure Laterality Date   CARDIAC DEFIBRILLATOR PLACEMENT  03/11/2004   Medtronic Maximo single lead   CATARACT EXTRACTION     COLONOSCOPY W/ BIOPSIES AND POLYPECTOMY  08/2004, 02/24/11   6mm adenoma in 2006,  5 mm polyp (not recovered) 2012   CYSTOSCOPY WITH DIRECT VISION INTERNAL URETHROTOMY N/A 10/08/2015   Procedure: CYSTOSCOPY WITH DIRECT VISION INTERNAL URETHROTOMY;  Surgeon: Redell Lynwood Napoleon, MD;  Location: [facility];  Service: Urology;  Laterality: N/A;   CYSTOSCOPY WITH URETHRAL DILATATION N/A 04/15/2023   Procedure: CYSTOSCOPY AND ROSELEE WITH OPTILUME URETHRAL DILATATION;  Surgeon: Cam Morene ORN, MD;  Location: [facility];  Service: Urology;  Laterality: N/A;  60 MINUTES   IMPLANTABLE CARDIOVERTER DEFIBRILLATOR  GENERATOR CHANGE N/A 10/07/2012   Procedure: IMPLANTABLE CARDIOVERTER DEFIBRILLATOR GENERATOR CHANGE;  Surgeon: Danelle LELON Birmingham, MD;  Location: [facility] CATH LAB;  Service: Cardiovascular;  Laterality: N/A;   PROSTATECTOMY  06/12/2003   Dr Aleene   RECTAL SURGERY     UPPER GASTROINTESTINAL ENDOSCOPY  08/2004, 02/24/11   Barrett's esophagus    Family History: Family History  Problem Relation Age of Onset   Dementia Mother    Hypertension Mother    Coronary artery disease Father    Prostate cancer Father    Diabetes Father    Dementia Sister    Dementia Brother    Diabetes  Paternal Grandmother    Dementia Maternal Aunt    Stroke Neg Hx    Colon cancer Neg Hx    Colon polyps Neg Hx    Esophageal cancer Neg Hx    Rectal cancer Neg Hx    Stomach cancer Neg Hx     Social History  reports that he has never smoked. He has never used smokeless tobacco. He reports that he does not drink alcohol and does not use drugs.  Allergies  Allergen Reactions   Zantac  [Ranitidine  Hcl] Itching    Medications   Current Facility-Administered Medications:    albuterol  (PROVENTIL ) (2.5 MG/3ML) 0.083% nebulizer solution 2.5 mg, 2.5 mg, Nebulization, Q2H PRN, Ghimire, Donalda HERO, MD   atorvastatin  (LIPITOR) tablet 20 mg, 20 mg, Oral, Daily, Ghimire, Shanker M, MD, 20 mg at 01/05/24 1030   carvedilol  (COREG ) tablet 6.25 mg, 6.25 mg, Oral, BID WC, Ghimire, Shanker M, MD, 6.25 mg at 01/05/24 1808   cholecalciferol  (VITAMIN D3) 25 MCG (1000 UNIT) tablet 2,000 Units, 2,000 Units, Oral, Daily, Ghimire, Shanker M, MD, 2,000 Units at 01/05/24 1030   clopidogrel  (PLAVIX ) tablet 75 mg, 75 mg, Oral, Daily, Ghimire, Shanker M, MD, 75 mg at 01/05/24 1031   cyanocobalamin  (VITAMIN B12) tablet 1,000 mcg, 1,000 mcg, Oral, Once per day on Monday Wednesday Friday, Ghimire, Shanker M, MD, 1,000 mcg at 01/05/24 1030   diazepam  (VALIUM ) tablet 2 mg, 2 mg, Oral, Q6H PRN, Opyd, Timothy S, MD   digoxin  (LANOXIN ) tablet 0.0625 mg, 0.0625 mg, Oral, Daily, Ghimire, Shanker M, MD, 0.0625 mg at 01/05/24 1031   donepezil  (ARICEPT ) tablet 10 mg, 10 mg, Oral, Daily, Ghimire, Shanker M, MD, 10 mg at 01/05/24 1031   enoxaparin  (LOVENOX ) injection 40 mg, 40 mg, Subcutaneous, Q24H, Ghimire, Shanker M, MD, 40 mg at 01/05/24 1529   famotidine  (PEPCID ) tablet 20 mg, 20 mg, Oral, Daily, Ghimire, Shanker M, MD, 20 mg at 01/05/24 1030   hydrALAZINE  (APRESOLINE ) injection 10 mg, 10 mg, Intravenous, Q6H PRN, Ghimire, Donalda HERO, MD   insulin  aspart (novoLOG ) injection 0-9 Units, 0-9 Units, Subcutaneous, TID WC, Ghimire,  Donalda HERO, MD, 2 Units at 01/05/24 1253   losartan  (COZAAR ) tablet 25 mg, 25 mg, Oral, QHS, Ghimire, Shanker M, MD, 25 mg at 01/05/24 2134   meclizine  (ANTIVERT ) tablet 25 mg, 25 mg, Oral, TID PRN, Opyd, Timothy S, MD   memantine  (NAMENDA ) tablet 5 mg, 5 mg, Oral, BID, Ghimire, Shanker M, MD, 5 mg at 01/05/24 2133   ondansetron  (ZOFRAN ) injection 4 mg, 4 mg, Intravenous, Q6H PRN, Opyd, Timothy S, MD   pantoprazole  (PROTONIX ) EC tablet 40 mg, 40 mg, Oral, Daily, Ghimire, Shanker M, MD, 40 mg at 01/05/24 1030   sotalol  (BETAPACE ) tablet 120 mg, 120 mg, Oral, BID, Ghimire, Donalda HERO, MD, 120 mg at 01/05/24 2133  Vitals   Vitals:  01/05/24 1919 01/05/24 2052 01/05/24 2342 01/06/24 0400  BP: 137/76  (!) 150/108 139/81  Pulse: 63 61 61 (!) 58  Resp: 20 15 16 17   Temp: 98.5 F (36.9 C)  97.7 F (36.5 C)   TempSrc: Oral  Oral   SpO2: 99% 99%    Weight:      Height:        Body mass index is 22.32 kg/m.   Physical Exam   General: Laying comfortably in bed; in no acute distress.  HENT: Normal oropharynx and mucosa. Normal external appearance of ears and nose.  Neck: Supple, no pain or tenderness  CV: No JVD. No peripheral edema.  Pulmonary: Symmetric Chest rise. Normal respiratory effort.  Abdomen: Soft to touch, non-tender.  Ext: No cyanosis, edema, or deformity  Skin: No rash. Normal palpation of skin.   Musculoskeletal: Normal digits and nails by inspection. No clubbing.   Neurologic Examination  Mental status/Cognition: Alert, oriented to self, place, but not to month and year, good attention.  Speech/language: Fluent, comprehension intact, object naming intact, repetition intact.  Cranial nerves:   CN II Pupils equal and reactive to light, no VF deficits    CN III,IV,VI EOM intact, no gaze preference or deviation, no nystagmus    CN V normal sensation in V1, V2, and V3 segments bilaterally    CN VII no asymmetry, no nasolabial fold flattening    CN VIII normal hearing to  speech    CN IX & X normal palatal elevation, no uvular deviation    CN XI 5/5 head turn and 5/5 shoulder shrug bilaterally    CN XII midline tongue protrusion    Motor:  Muscle bulk: normal, tone normal, pronator drift none tremor none Mvmt Root Nerve  Muscle Right Left Comments  SA C5/6 Ax Deltoid 5 5   EF C5/6 Mc Biceps 5 5   EE C6/7/8 Rad Triceps 5 5   WF C6/7 Med FCR     WE C7/8 PIN ECU     F Ab C8/T1 U ADM/FDI 5 5   HF L1/2/3 Fem Illopsoas 5 5   KE L2/3/4 Fem Quad 5 5   DF L4/5 D Peron Tib Ant 5 5   PF S1/2 Tibial Grc/Sol 5 5    Sensation:  Light touch Intact throuhgout   Pin prick    Temperature    Vibration   Proprioception    Coordination/Complex Motor:  - Finger to Nose intact BL - Heel to shin intact BL - Rapid alternating movement are normal - Gait: deferred.  Labs/Imaging/Neurodiagnostic studies   CBC:  Recent Labs  Lab January 16, 2024 1325 01/05/24 0841  WBC 7.0 5.7  HGB 11.0* 10.8*  HCT 34.2* 34.2*  MCV 72.5* 73.5*  PLT 210 199   Basic Metabolic Panel:  Lab Results  Component Value Date   NA 142 01-16-24   K 3.6 01-16-2024   CO2 25 01-16-24   GLUCOSE 136 (H) Jan 16, 2024   BUN 6 (L) 01-16-24   CREATININE 0.84 01/05/2024   CALCIUM  9.3 Jan 16, 2024   GFRNONAA >60 01/05/2024   GFRAA 90 04/13/2019   Lipid Panel:  Lab Results  Component Value Date   LDLCALC 57 07/19/2023   HgbA1c:  Lab Results  Component Value Date   HGBA1C 5.9 07/19/2023   Urine Drug Screen:     Component Value Date/Time   LABOPIA NONE DETECTED 01/05/2022 1810   COCAINSCRNUR NONE DETECTED 01/05/2022 1810   LABBENZ NONE DETECTED 01/05/2022 1810  AMPHETMU NONE DETECTED 01/05/2022 1810   THCU NONE DETECTED 01/05/2022 1810   LABBARB NONE DETECTED 01/05/2022 1810    Alcohol Level     Component Value Date/Time   ETH <10 01/05/2022 1810   INR  Lab Results  Component Value Date   INR 1.1 01/05/2022   APTT  Lab Results  Component Value Date   APTT 30 01/05/2022    AED levels: No results found for: PHENYTOIN, ZONISAMIDE, LAMOTRIGINE, LEVETIRACETA  CT Head without contrast(Personally reviewed): CTH was negative for a large hypodensity concerning for a large territory infarct or hyperdensity concerning for an ICH   CT angio Head and Neck with contrast(Personally reviewed): No LVO  MRI Brain(Personally reviewed): Unable to get due to ICD  ASSESSMENT   [name] is a 73 y.o. male  with hx of VT s/p ICD, CAD, HTN, dementia, GERD, who presents with intermittent vertigo that is trigger with sitting or standing up or significant abruot movement of his head.  Presentation is most suggestive of peripheral vertigo.  HINTS + exam with no nystagmus, corrective saccade with rotation of the head and no skew deviation.  RECOMMENDATIONS  - Vertibular rehab - Meclizine  PRN - counseled him and his wife on the importance of drinking water . He only drinks pepsi all day. Does seem to have orthostatic component too. - neurology will signoff.  Plan discussed in detail with Dr. Raenelle with the Hospitalist team. ______________________________________________________________________    Signed, Amere Bricco, MD [facility]

## 2024-01-06 NOTE — Progress Notes (Signed)
   01/06/24 0500  Orthostatic Lying   BP- Lying (!) 183/94  Pulse- Lying 56  Orthostatic Sitting  BP- Sitting (!) 152/94  Pulse- Sitting 59  Orthostatic Standing at 0 minutes  BP- Standing at 0 minutes 134/85  Pulse- Standing at 0 minutes 64  Orthostatic Standing at 3 minutes  BP- Standing at 3 minutes 118/85  Pulse- Standing at 3 minutes 64   Patient experienced mild dizziness from lying to sitting at the edge of the bed, but denies when standing up and able to tolerate to finish the session.

## 2024-01-08 DIAGNOSIS — F03B4 Unspecified dementia, moderate, with anxiety: Secondary | ICD-10-CM | POA: Diagnosis not present

## 2024-01-08 DIAGNOSIS — E1140 Type 2 diabetes mellitus with diabetic neuropathy, unspecified: Secondary | ICD-10-CM | POA: Diagnosis not present

## 2024-01-08 DIAGNOSIS — I951 Orthostatic hypotension: Secondary | ICD-10-CM | POA: Diagnosis not present

## 2024-01-08 DIAGNOSIS — G4733 Obstructive sleep apnea (adult) (pediatric): Secondary | ICD-10-CM | POA: Diagnosis not present

## 2024-01-08 DIAGNOSIS — F03B2 Unspecified dementia, moderate, with psychotic disturbance: Secondary | ICD-10-CM | POA: Diagnosis not present

## 2024-01-08 DIAGNOSIS — H81399 Other peripheral vertigo, unspecified ear: Secondary | ICD-10-CM | POA: Diagnosis not present

## 2024-01-08 DIAGNOSIS — F410 Panic disorder [episodic paroxysmal anxiety] without agoraphobia: Secondary | ICD-10-CM | POA: Diagnosis not present

## 2024-01-08 DIAGNOSIS — H8112 Benign paroxysmal vertigo, left ear: Secondary | ICD-10-CM | POA: Diagnosis not present

## 2024-01-08 DIAGNOSIS — F03B18 Unspecified dementia, moderate, with other behavioral disturbance: Secondary | ICD-10-CM | POA: Diagnosis not present

## 2024-01-12 ENCOUNTER — Encounter: Payer: Self-pay | Admitting: Internal Medicine

## 2024-01-12 DIAGNOSIS — I951 Orthostatic hypotension: Secondary | ICD-10-CM | POA: Diagnosis not present

## 2024-01-12 DIAGNOSIS — H8112 Benign paroxysmal vertigo, left ear: Secondary | ICD-10-CM | POA: Diagnosis not present

## 2024-01-12 DIAGNOSIS — G4733 Obstructive sleep apnea (adult) (pediatric): Secondary | ICD-10-CM | POA: Diagnosis not present

## 2024-01-12 DIAGNOSIS — F03B18 Unspecified dementia, moderate, with other behavioral disturbance: Secondary | ICD-10-CM | POA: Diagnosis not present

## 2024-01-12 DIAGNOSIS — H81399 Other peripheral vertigo, unspecified ear: Secondary | ICD-10-CM | POA: Diagnosis not present

## 2024-01-12 DIAGNOSIS — F03B2 Unspecified dementia, moderate, with psychotic disturbance: Secondary | ICD-10-CM | POA: Diagnosis not present

## 2024-01-12 DIAGNOSIS — F03B4 Unspecified dementia, moderate, with anxiety: Secondary | ICD-10-CM | POA: Diagnosis not present

## 2024-01-12 DIAGNOSIS — E1140 Type 2 diabetes mellitus with diabetic neuropathy, unspecified: Secondary | ICD-10-CM | POA: Diagnosis not present

## 2024-01-12 DIAGNOSIS — F410 Panic disorder [episodic paroxysmal anxiety] without agoraphobia: Secondary | ICD-10-CM | POA: Diagnosis not present

## 2024-01-12 NOTE — Assessment & Plan Note (Signed)
 BPPV  vx orthostatic hypotension -- likely has both Continue home vestibular PT, meclizine  as needed - can take 12.5-25 mg TID prn - if not helpful can consider valium  Coreg  decreased in hospital - continue 12.5 mg twice daily Monitor BP at home, stay well hydrated Will check ua, ucx to r/o a UTI

## 2024-01-12 NOTE — Progress Notes (Unsigned)
 Subjective:    Patient ID: [name], male    DOB: 12/16/1950, 73 y.o.   MRN: [id number]     HPI Jaimon is here for follow up from the ED     ED observation 01/04/24-01/06/24  for dizziness  He woke up the day prior with new onset vertigo when walking to the bathroom - it was a spinning sensation.  He had associated nausea.  No fever, syncope.  Vertigo was slightly better.  He did state some positional variation to vertigo - especially with sitting up/standing.  Ct head/ CTA head/neck negative.  MRI not possible due to ICD - low suspicion for cva.  Vertigo possibly BPPV.  As needed meclizine .  Vestibular PT evaluation.    PT reported Orthostatics positive with vertiginous quality on their evaluation.  Coreg  decreased to 6.25 mg bid, IVF given, compression socks  Chronic HRrEF, hx VT with ICD, DM, htn, hx of TIA, OSA, dementia all stable.     Medications and allergies reviewed with patient and updated if appropriate.  Current Outpatient Medications on File Prior to Visit  Medication Sig Dispense Refill   acetaminophen  (TYLENOL ) 500 MG tablet Take 1,000 mg by mouth every 6 (six) hours as needed for mild pain.     atorvastatin  (LIPITOR) 20 MG tablet TAKE 1 TABLET DAILY EXCEPT TAKE 1/2 TABLET ON TUESDAY, THURSDAY, AND SATURDAYS 72 tablet 3   carvedilol  (COREG ) 25 MG tablet Take 0.5 tablets (12.5 mg total) by mouth 2 (two) times daily with a meal. 180 tablet 3   Cholecalciferol  (VITAMIN D ) 50 MCG (2000 UT) tablet Take 2,000 Units by mouth daily.     clopidogrel  (PLAVIX ) 75 MG tablet TAKE 1 TABLET EVERY DAY 90 tablet 3   cyanocobalamin  (VITAMIN B12) 1000 MCG tablet TAKE 1 TABLET EVERY DAY (Patient taking differently: Take 1,000 mcg by mouth every Monday, Wednesday, and Friday.) 90 tablet 3   digoxin  (LANOXIN ) 0.125 MG tablet TAKE 1 TABLET EVERY DAY 90 tablet 3   docusate sodium  (COLACE) 100 MG capsule Take 1 capsule (100 mg total) by mouth 2 (two) times daily as needed for mild  constipation. 60 capsule 2   donepezil  (ARICEPT ) 10 MG tablet TAKE 1 TABLET EVERY DAY 90 tablet 3   famotidine  (PEPCID ) 20 MG tablet TAKE 1 TABLET EVERY DAY 90 tablet 3   JANUMET  50-1000 MG tablet TAKE 1 TABLET EVERY DAY WITH A MEAL 90 tablet 3   latanoprost (XALATAN) 0.005 % ophthalmic solution Place 1 drop into both eyes at bedtime.     losartan  (COZAAR ) 25 MG tablet TAKE 1 TABLET AT BEDTIME 90 tablet 3   meclizine  (ANTIVERT ) 25 MG tablet Take 1 tablet (25 mg total) by mouth 3 (three) times daily as needed for dizziness. 30 tablet 0   memantine  (NAMENDA ) 5 MG tablet Take one tab twice a day 180 tablet 3   NON FORMULARY Pt uses a cpap nightly     OVER THE COUNTER MEDICATION Apply 1 Application topically as needed (pain). Nervine Cream     pantoprazole  (PROTONIX ) 40 MG tablet TAKE 1 TABLET EVERY DAY 90 tablet 3   SODIUM FLUORIDE 5000 SENSITIVE 1.1-5 % GEL Place 1 Application onto teeth daily.     sotalol  (BETAPACE ) 120 MG tablet TAKE 1 TABLET TWICE DAILY 180 tablet 3   No current facility-administered medications on file prior to visit.     Review of Systems     Objective:  There were no vitals filed  for this visit. BP Readings from Last 3 Encounters:  01/06/24 121/76  11/08/23 138/82  10/25/23 115/70   Wt Readings from Last 3 Encounters:  01/04/24 160 lb 0.9 oz (72.6 kg)  10/25/23 170 lb (77.1 kg)  10/15/23 169 lb (76.7 kg)   There is no height or weight on file to calculate BMI.    Physical Exam     Lab Results  Component Value Date   WBC 6.2 01/06/2024   HGB 11.3 (L) 01/06/2024   HCT 35.8 (L) 01/06/2024   PLT 207 01/06/2024   GLUCOSE 101 (H) 01/06/2024   CHOL 109 07/19/2023   TRIG 47.0 07/19/2023   HDL 42.10 07/19/2023   LDLCALC 57 07/19/2023   ALT 10 07/19/2023   AST 13 07/19/2023   NA 144 01/06/2024   K 3.5 01/06/2024   CL 108 01/06/2024   CREATININE 0.93 01/06/2024   BUN 7 (L) 01/06/2024   CO2 28 01/06/2024   TSH 2.52 01/14/2023   PSA 0.00 Repeated and  verified X2. (L) 11/14/2020   INR 1.1 01/05/2022   HGBA1C 5.9 07/19/2023   MICROALBUR 1.22 11/10/2012   CT ANGIO HEAD NECK W WO CM CLINICAL DATA:  Vertigo, central  EXAM: CT ANGIOGRAPHY HEAD AND NECK WITH AND WITHOUT CONTRAST  TECHNIQUE: Multidetector CT imaging of the head and neck was performed using the standard protocol during bolus administration of intravenous contrast. Multiplanar CT image reconstructions and MIPs were obtained to evaluate the vascular anatomy. Carotid stenosis measurements (when applicable) are obtained utilizing NASCET criteria, using the distal internal carotid diameter as the denominator.  RADIATION DOSE REDUCTION: This exam was performed according to the departmental dose-optimization program which includes automated exposure control, adjustment of the mA and/or kV according to patient size and/or use of iterative reconstruction technique.  CONTRAST:  75mL OMNIPAQUE  IOHEXOL  350 MG/ML SOLN  COMPARISON:  None Available.  FINDINGS: CT HEAD FINDINGS  Brain: No evidence of acute large vascular territory infarction, hemorrhage, hydrocephalus, extra-axial collection or mass lesion/mass effect. Patchy white matter hypodensities, nonspecific but compatible with chronic microvascular ischemic disease.  Vascular: See below.  Skull: Normal. Negative for fracture or focal lesion.  Sinuses/Orbits: Clear sinuses.  No acute orbital findings.  Review of the MIP images confirms the above findings  CTA NECK FINDINGS  Aortic arch: Great vessel origins are patent without significant stenosis.  Right carotid system: No evidence of dissection, stenosis (50% or greater), or occlusion.  Left carotid system: No evidence of dissection, stenosis (50% or greater), or occlusion.  Vertebral arteries: Right dominant. No evidence of dissection, stenosis (50% or greater), or occlusion.  Skeleton: No acute fracture on limited assessment.  Other neck: No acute  abnormality on limited assessment.  Upper chest: Lung apices are clear.  Review of the MIP images confirms the above findings  CTA HEAD FINDINGS  Anterior circulation: Bilateral intracranial ICAs, MCAs, and ACAs are patent without proximal hemodynamically significant stenosis.  Posterior circulation: Bilateral intradural vertebral arteries, basilar artery and bilateral posterior cerebral arteries are patent without proximal hemodynamically significant stenosis. Small/non dominant left intradural vertebral artery peers to largely terminate as PICA, anatomic variant. Left fetal type PCA, anatomic variant.  Venous sinuses: As permitted by contrast timing, patent.  Anatomic variants: As above.  Review of the MIP images confirms the above findings  IMPRESSION: 1. No evidence of acute intracranial abnormality. 2. No large vessel occlusion or proximal hemodynamically significant stenosis.  Electronically Signed   By: Gilmore GORMAN Molt M.D.   On: 01/04/2024  16:53    Assessment & Plan:    See Problem List for Assessment and Plan of chronic medical problems.

## 2024-01-13 ENCOUNTER — Ambulatory Visit: Payer: Self-pay | Admitting: Internal Medicine

## 2024-01-13 ENCOUNTER — Encounter: Payer: Self-pay | Admitting: Internal Medicine

## 2024-01-13 ENCOUNTER — Ambulatory Visit: Admitting: Internal Medicine

## 2024-01-13 VITALS — BP 120/70 | HR 59 | Temp 98.2°F | Ht 71.0 in | Wt 171.2 lb

## 2024-01-13 DIAGNOSIS — I700 Atherosclerosis of aorta: Secondary | ICD-10-CM | POA: Diagnosis not present

## 2024-01-13 DIAGNOSIS — R42 Dizziness and giddiness: Secondary | ICD-10-CM

## 2024-01-13 DIAGNOSIS — R350 Frequency of micturition: Secondary | ICD-10-CM | POA: Diagnosis not present

## 2024-01-13 DIAGNOSIS — E119 Type 2 diabetes mellitus without complications: Secondary | ICD-10-CM | POA: Diagnosis not present

## 2024-01-13 DIAGNOSIS — E785 Hyperlipidemia, unspecified: Secondary | ICD-10-CM

## 2024-01-13 DIAGNOSIS — K219 Gastro-esophageal reflux disease without esophagitis: Secondary | ICD-10-CM

## 2024-01-13 DIAGNOSIS — Z7984 Long term (current) use of oral hypoglycemic drugs: Secondary | ICD-10-CM

## 2024-01-13 DIAGNOSIS — I10 Essential (primary) hypertension: Secondary | ICD-10-CM | POA: Diagnosis not present

## 2024-01-13 DIAGNOSIS — E1151 Type 2 diabetes mellitus with diabetic peripheral angiopathy without gangrene: Secondary | ICD-10-CM

## 2024-01-13 DIAGNOSIS — E1169 Type 2 diabetes mellitus with other specified complication: Secondary | ICD-10-CM

## 2024-01-13 LAB — COMPREHENSIVE METABOLIC PANEL WITH GFR
ALT: 17 U/L (ref 0–53)
AST: 16 U/L (ref 0–37)
Albumin: 3.8 g/dL (ref 3.5–5.2)
Alkaline Phosphatase: 109 U/L (ref 39–117)
BUN: 9 mg/dL (ref 6–23)
CO2: 30 meq/L (ref 19–32)
Calcium: 8.7 mg/dL (ref 8.4–10.5)
Chloride: 108 meq/L (ref 96–112)
Creatinine, Ser: 0.85 mg/dL (ref 0.40–1.50)
GFR: 86.73 mL/min (ref >60.00)
Glucose, Bld: 96 mg/dL (ref 70–99)
Potassium: 3.8 meq/L (ref 3.5–5.1)
Sodium: 144 meq/L (ref 135–145)
Total Bilirubin: 0.5 mg/dL (ref 0.2–1.2)
Total Protein: 6.7 g/dL (ref 6.0–8.3)

## 2024-01-13 LAB — URINALYSIS, ROUTINE W REFLEX MICROSCOPIC
Hgb urine dipstick: NEGATIVE
Leukocytes,Ua: NEGATIVE
Nitrite: NEGATIVE
RBC / HPF: NONE SEEN (ref 0–?)
Specific Gravity, Urine: 1.025 (ref 1.000–1.030)
Urine Glucose: NEGATIVE
Urobilinogen, UA: 1 (ref 0.0–1.0)
pH: 6 (ref 5.0–8.0)

## 2024-01-13 LAB — HEMOGLOBIN A1C: Hgb A1c MFr Bld: 6.1 % (ref 4.6–6.5)

## 2024-01-13 LAB — LIPID PANEL
Cholesterol: 97 mg/dL (ref 0–200)
HDL: 41.4 mg/dL (ref >39.00)
LDL Cholesterol: 50 mg/dL (ref 0–99)
NonHDL: 55.93
Total CHOL/HDL Ratio: 2
Triglycerides: 30 mg/dL (ref 0.0–149.0)
VLDL: 6 mg/dL (ref 0.0–40.0)

## 2024-01-13 LAB — MICROALBUMIN / CREATININE URINE RATIO
Creatinine,U: 234.7 mg/dL
Microalb Creat Ratio: 24.1 mg/g (ref 0.0–30.0)
Microalb, Ur: 5.7 mg/dL — ABNORMAL HIGH (ref 0.0–1.9)

## 2024-01-13 NOTE — Assessment & Plan Note (Signed)
 Chronic  Lab Results  Component Value Date   HGBA1C 6.1 01/13/2024   Sugars well controlled Check A1c Continue Janumet  50-1000 mg daily Stressed regular exercise, diabetic diet

## 2024-01-13 NOTE — Patient Instructions (Addendum)
   Give blood /urine sample in the lab.     Medications changes include :   increase coreg  back to 12.5 mg twice daily   Stay hydrated.   Monitor BP at home     Return for cancel 9/12 appt and schedule f/u in 6 months.

## 2024-01-13 NOTE — Assessment & Plan Note (Addendum)
 Chronic Blood pressure elevated initially but controlled on repeat  Does have some dizziness still - likely BPPV Monitor BP at home Continue Coreg  12.5 mg twice daily, losartan  25 mg daily, sotalol  120 mg twice daily Cmp, cbc

## 2024-01-13 NOTE — Assessment & Plan Note (Signed)
 Chronic Continue atorvastatin  20 mg daily Lab Results  Component Value Date   LDLCALC 50 01/13/2024   LDL at goal Continue atorvastatin  20 mg 4/week, 10 mg 3/ week Encouraged continuing high activity level, encouraged healthy diet

## 2024-01-13 NOTE — Assessment & Plan Note (Signed)
 Chronic Lab Results  Component Value Date   LDLCALC 50 01/13/2024   Check lipid panel, CMP Continue atorvastatin  20 mg daily 4 days a week, 10 mg 3 days a week

## 2024-01-13 NOTE — Assessment & Plan Note (Signed)
 Chronic GERD controlled History of Barrett's esophagus Continue famotidine 20 mg daily, pantoprazole 40 mg daily

## 2024-01-14 DIAGNOSIS — H81399 Other peripheral vertigo, unspecified ear: Secondary | ICD-10-CM | POA: Diagnosis not present

## 2024-01-14 DIAGNOSIS — I951 Orthostatic hypotension: Secondary | ICD-10-CM | POA: Diagnosis not present

## 2024-01-14 DIAGNOSIS — E1140 Type 2 diabetes mellitus with diabetic neuropathy, unspecified: Secondary | ICD-10-CM | POA: Diagnosis not present

## 2024-01-14 DIAGNOSIS — G4733 Obstructive sleep apnea (adult) (pediatric): Secondary | ICD-10-CM | POA: Diagnosis not present

## 2024-01-14 DIAGNOSIS — H8112 Benign paroxysmal vertigo, left ear: Secondary | ICD-10-CM | POA: Diagnosis not present

## 2024-01-14 DIAGNOSIS — F03B4 Unspecified dementia, moderate, with anxiety: Secondary | ICD-10-CM | POA: Diagnosis not present

## 2024-01-14 DIAGNOSIS — F03B2 Unspecified dementia, moderate, with psychotic disturbance: Secondary | ICD-10-CM | POA: Diagnosis not present

## 2024-01-14 DIAGNOSIS — F03B18 Unspecified dementia, moderate, with other behavioral disturbance: Secondary | ICD-10-CM | POA: Diagnosis not present

## 2024-01-14 DIAGNOSIS — F410 Panic disorder [episodic paroxysmal anxiety] without agoraphobia: Secondary | ICD-10-CM | POA: Diagnosis not present

## 2024-01-14 LAB — URINE CULTURE: Result:: NO GROWTH

## 2024-01-17 ENCOUNTER — Other Ambulatory Visit: Payer: Self-pay | Admitting: Internal Medicine

## 2024-01-17 DIAGNOSIS — F03B18 Unspecified dementia, moderate, with other behavioral disturbance: Secondary | ICD-10-CM | POA: Diagnosis not present

## 2024-01-17 DIAGNOSIS — H81399 Other peripheral vertigo, unspecified ear: Secondary | ICD-10-CM | POA: Diagnosis not present

## 2024-01-17 DIAGNOSIS — F03B2 Unspecified dementia, moderate, with psychotic disturbance: Secondary | ICD-10-CM | POA: Diagnosis not present

## 2024-01-17 DIAGNOSIS — E1140 Type 2 diabetes mellitus with diabetic neuropathy, unspecified: Secondary | ICD-10-CM | POA: Diagnosis not present

## 2024-01-17 DIAGNOSIS — G4733 Obstructive sleep apnea (adult) (pediatric): Secondary | ICD-10-CM | POA: Diagnosis not present

## 2024-01-17 DIAGNOSIS — I951 Orthostatic hypotension: Secondary | ICD-10-CM | POA: Diagnosis not present

## 2024-01-17 DIAGNOSIS — F03B4 Unspecified dementia, moderate, with anxiety: Secondary | ICD-10-CM | POA: Diagnosis not present

## 2024-01-17 DIAGNOSIS — H8112 Benign paroxysmal vertigo, left ear: Secondary | ICD-10-CM | POA: Diagnosis not present

## 2024-01-17 DIAGNOSIS — F410 Panic disorder [episodic paroxysmal anxiety] without agoraphobia: Secondary | ICD-10-CM | POA: Diagnosis not present

## 2024-01-19 DIAGNOSIS — I951 Orthostatic hypotension: Secondary | ICD-10-CM | POA: Diagnosis not present

## 2024-01-19 DIAGNOSIS — F03B18 Unspecified dementia, moderate, with other behavioral disturbance: Secondary | ICD-10-CM | POA: Diagnosis not present

## 2024-01-19 DIAGNOSIS — F03B2 Unspecified dementia, moderate, with psychotic disturbance: Secondary | ICD-10-CM | POA: Diagnosis not present

## 2024-01-19 DIAGNOSIS — E1140 Type 2 diabetes mellitus with diabetic neuropathy, unspecified: Secondary | ICD-10-CM | POA: Diagnosis not present

## 2024-01-19 DIAGNOSIS — H81399 Other peripheral vertigo, unspecified ear: Secondary | ICD-10-CM | POA: Diagnosis not present

## 2024-01-19 DIAGNOSIS — F03B4 Unspecified dementia, moderate, with anxiety: Secondary | ICD-10-CM | POA: Diagnosis not present

## 2024-01-19 DIAGNOSIS — F410 Panic disorder [episodic paroxysmal anxiety] without agoraphobia: Secondary | ICD-10-CM | POA: Diagnosis not present

## 2024-01-19 DIAGNOSIS — G4733 Obstructive sleep apnea (adult) (pediatric): Secondary | ICD-10-CM | POA: Diagnosis not present

## 2024-01-19 DIAGNOSIS — H8112 Benign paroxysmal vertigo, left ear: Secondary | ICD-10-CM | POA: Diagnosis not present

## 2024-01-20 ENCOUNTER — Telehealth: Payer: Self-pay

## 2024-01-20 NOTE — Telephone Encounter (Signed)
 Okay for orders?

## 2024-01-20 NOTE — Telephone Encounter (Signed)
 Message left for [name] today with verbals.

## 2024-01-20 NOTE — Telephone Encounter (Signed)
 Copied from CRM [id number]. Topic: Clinical - Home Health Verbal Orders >> Jan 20, 2024 12:15 PM Thersia BROCKS wrote: Caller/Agency: Norleen Rushing Number: [phone] - secure voicemail  Service Requested: Occupational Therapy Frequency: 1 week for 2 weeks evaluated on the 5th for functional mobility Any new concerns about the patient? No

## 2024-01-21 ENCOUNTER — Telehealth: Payer: Self-pay

## 2024-01-21 ENCOUNTER — Encounter: Admitting: Internal Medicine

## 2024-01-21 DIAGNOSIS — F03B18 Unspecified dementia, moderate, with other behavioral disturbance: Secondary | ICD-10-CM

## 2024-01-21 DIAGNOSIS — H81399 Other peripheral vertigo, unspecified ear: Secondary | ICD-10-CM | POA: Diagnosis not present

## 2024-01-21 DIAGNOSIS — I951 Orthostatic hypotension: Secondary | ICD-10-CM | POA: Diagnosis not present

## 2024-01-21 DIAGNOSIS — I5022 Chronic systolic (congestive) heart failure: Secondary | ICD-10-CM

## 2024-01-21 DIAGNOSIS — F03B2 Unspecified dementia, moderate, with psychotic disturbance: Secondary | ICD-10-CM

## 2024-01-21 DIAGNOSIS — I110 Hypertensive heart disease with heart failure: Secondary | ICD-10-CM

## 2024-01-21 DIAGNOSIS — F03B4 Unspecified dementia, moderate, with anxiety: Secondary | ICD-10-CM | POA: Diagnosis not present

## 2024-01-21 DIAGNOSIS — I700 Atherosclerosis of aorta: Secondary | ICD-10-CM

## 2024-01-21 DIAGNOSIS — F410 Panic disorder [episodic paroxysmal anxiety] without agoraphobia: Secondary | ICD-10-CM | POA: Diagnosis not present

## 2024-01-21 DIAGNOSIS — E1140 Type 2 diabetes mellitus with diabetic neuropathy, unspecified: Secondary | ICD-10-CM

## 2024-01-21 DIAGNOSIS — H8112 Benign paroxysmal vertigo, left ear: Secondary | ICD-10-CM | POA: Diagnosis not present

## 2024-01-21 DIAGNOSIS — G4733 Obstructive sleep apnea (adult) (pediatric): Secondary | ICD-10-CM

## 2024-01-21 NOTE — Telephone Encounter (Signed)
 Received call as their documented weights in [place] systems are different per [facility], physical therapy. Verified our office weights are similar to what patient home scale is reading, but [facility] has patient weights of 160, 163, and 161.  No additional questions at this time, as understand their system is incorrect.

## 2024-01-24 DIAGNOSIS — F410 Panic disorder [episodic paroxysmal anxiety] without agoraphobia: Secondary | ICD-10-CM | POA: Diagnosis not present

## 2024-01-24 DIAGNOSIS — H8112 Benign paroxysmal vertigo, left ear: Secondary | ICD-10-CM | POA: Diagnosis not present

## 2024-01-24 DIAGNOSIS — F03B18 Unspecified dementia, moderate, with other behavioral disturbance: Secondary | ICD-10-CM | POA: Diagnosis not present

## 2024-01-24 DIAGNOSIS — I951 Orthostatic hypotension: Secondary | ICD-10-CM | POA: Diagnosis not present

## 2024-01-24 DIAGNOSIS — G4733 Obstructive sleep apnea (adult) (pediatric): Secondary | ICD-10-CM | POA: Diagnosis not present

## 2024-01-24 DIAGNOSIS — E1140 Type 2 diabetes mellitus with diabetic neuropathy, unspecified: Secondary | ICD-10-CM | POA: Diagnosis not present

## 2024-01-24 DIAGNOSIS — F03B2 Unspecified dementia, moderate, with psychotic disturbance: Secondary | ICD-10-CM | POA: Diagnosis not present

## 2024-01-24 DIAGNOSIS — F03B4 Unspecified dementia, moderate, with anxiety: Secondary | ICD-10-CM | POA: Diagnosis not present

## 2024-01-24 DIAGNOSIS — H81399 Other peripheral vertigo, unspecified ear: Secondary | ICD-10-CM | POA: Diagnosis not present

## 2024-01-25 ENCOUNTER — Other Ambulatory Visit (HOSPITAL_BASED_OUTPATIENT_CLINIC_OR_DEPARTMENT_OTHER): Payer: Self-pay

## 2024-01-25 MED ORDER — FLUZONE HIGH-DOSE 0.5 ML IM SUSY
0.5000 mL | PREFILLED_SYRINGE | Freq: Once | INTRAMUSCULAR | 0 refills | Status: AC
Start: 2024-01-25 — End: 2024-01-26
  Filled 2024-01-25: qty 0.5, 1d supply, fill #0

## 2024-01-25 MED ORDER — COMIRNATY 30 MCG/0.3ML IM SUSY
0.3000 mL | PREFILLED_SYRINGE | Freq: Once | INTRAMUSCULAR | 0 refills | Status: AC
Start: 2024-01-25 — End: 2024-01-26
  Filled 2024-01-25: qty 0.3, 1d supply, fill #0

## 2024-01-26 DIAGNOSIS — I951 Orthostatic hypotension: Secondary | ICD-10-CM | POA: Diagnosis not present

## 2024-01-26 DIAGNOSIS — H8112 Benign paroxysmal vertigo, left ear: Secondary | ICD-10-CM | POA: Diagnosis not present

## 2024-01-26 DIAGNOSIS — E1140 Type 2 diabetes mellitus with diabetic neuropathy, unspecified: Secondary | ICD-10-CM | POA: Diagnosis not present

## 2024-01-26 DIAGNOSIS — H81399 Other peripheral vertigo, unspecified ear: Secondary | ICD-10-CM | POA: Diagnosis not present

## 2024-01-26 DIAGNOSIS — F03B2 Unspecified dementia, moderate, with psychotic disturbance: Secondary | ICD-10-CM | POA: Diagnosis not present

## 2024-01-26 DIAGNOSIS — F410 Panic disorder [episodic paroxysmal anxiety] without agoraphobia: Secondary | ICD-10-CM | POA: Diagnosis not present

## 2024-01-26 DIAGNOSIS — F03B18 Unspecified dementia, moderate, with other behavioral disturbance: Secondary | ICD-10-CM | POA: Diagnosis not present

## 2024-01-26 DIAGNOSIS — F03B4 Unspecified dementia, moderate, with anxiety: Secondary | ICD-10-CM | POA: Diagnosis not present

## 2024-01-26 DIAGNOSIS — G4733 Obstructive sleep apnea (adult) (pediatric): Secondary | ICD-10-CM | POA: Diagnosis not present

## 2024-02-02 ENCOUNTER — Encounter: Payer: Self-pay | Admitting: Podiatry

## 2024-02-02 ENCOUNTER — Ambulatory Visit: Admitting: Podiatry

## 2024-02-02 DIAGNOSIS — F410 Panic disorder [episodic paroxysmal anxiety] without agoraphobia: Secondary | ICD-10-CM | POA: Diagnosis not present

## 2024-02-02 DIAGNOSIS — E0842 Diabetes mellitus due to underlying condition with diabetic polyneuropathy: Secondary | ICD-10-CM

## 2024-02-02 DIAGNOSIS — B351 Tinea unguium: Secondary | ICD-10-CM | POA: Diagnosis not present

## 2024-02-02 DIAGNOSIS — F03B2 Unspecified dementia, moderate, with psychotic disturbance: Secondary | ICD-10-CM | POA: Diagnosis not present

## 2024-02-02 DIAGNOSIS — E1140 Type 2 diabetes mellitus with diabetic neuropathy, unspecified: Secondary | ICD-10-CM | POA: Diagnosis not present

## 2024-02-02 DIAGNOSIS — M79674 Pain in right toe(s): Secondary | ICD-10-CM

## 2024-02-02 DIAGNOSIS — M79675 Pain in left toe(s): Secondary | ICD-10-CM

## 2024-02-02 DIAGNOSIS — F03B18 Unspecified dementia, moderate, with other behavioral disturbance: Secondary | ICD-10-CM | POA: Diagnosis not present

## 2024-02-02 DIAGNOSIS — H81399 Other peripheral vertigo, unspecified ear: Secondary | ICD-10-CM | POA: Diagnosis not present

## 2024-02-02 DIAGNOSIS — F03B4 Unspecified dementia, moderate, with anxiety: Secondary | ICD-10-CM | POA: Diagnosis not present

## 2024-02-02 DIAGNOSIS — H8112 Benign paroxysmal vertigo, left ear: Secondary | ICD-10-CM | POA: Diagnosis not present

## 2024-02-02 DIAGNOSIS — G4733 Obstructive sleep apnea (adult) (pediatric): Secondary | ICD-10-CM | POA: Diagnosis not present

## 2024-02-02 DIAGNOSIS — I951 Orthostatic hypotension: Secondary | ICD-10-CM | POA: Diagnosis not present

## 2024-02-02 NOTE — Progress Notes (Signed)

## 2024-02-03 ENCOUNTER — Other Ambulatory Visit: Payer: Self-pay | Admitting: Internal Medicine

## 2024-02-05 NOTE — Progress Notes (Signed)
 Remote ICD Transmission

## 2024-02-11 DIAGNOSIS — E1140 Type 2 diabetes mellitus with diabetic neuropathy, unspecified: Secondary | ICD-10-CM | POA: Diagnosis not present

## 2024-02-11 DIAGNOSIS — H81399 Other peripheral vertigo, unspecified ear: Secondary | ICD-10-CM | POA: Diagnosis not present

## 2024-02-11 DIAGNOSIS — H8112 Benign paroxysmal vertigo, left ear: Secondary | ICD-10-CM | POA: Diagnosis not present

## 2024-02-11 DIAGNOSIS — I951 Orthostatic hypotension: Secondary | ICD-10-CM | POA: Diagnosis not present

## 2024-02-11 DIAGNOSIS — F03B2 Unspecified dementia, moderate, with psychotic disturbance: Secondary | ICD-10-CM | POA: Diagnosis not present

## 2024-02-11 DIAGNOSIS — F03B4 Unspecified dementia, moderate, with anxiety: Secondary | ICD-10-CM | POA: Diagnosis not present

## 2024-02-11 DIAGNOSIS — G4733 Obstructive sleep apnea (adult) (pediatric): Secondary | ICD-10-CM | POA: Diagnosis not present

## 2024-02-11 DIAGNOSIS — F03B18 Unspecified dementia, moderate, with other behavioral disturbance: Secondary | ICD-10-CM | POA: Diagnosis not present

## 2024-02-11 DIAGNOSIS — F410 Panic disorder [episodic paroxysmal anxiety] without agoraphobia: Secondary | ICD-10-CM | POA: Diagnosis not present

## 2024-02-21 ENCOUNTER — Encounter: Payer: Self-pay | Admitting: Physician Assistant

## 2024-02-21 DIAGNOSIS — H5703 Miosis: Secondary | ICD-10-CM | POA: Diagnosis not present

## 2024-02-21 DIAGNOSIS — H5347 Heteronymous bilateral field defects: Secondary | ICD-10-CM | POA: Diagnosis not present

## 2024-02-21 DIAGNOSIS — H26492 Other secondary cataract, left eye: Secondary | ICD-10-CM | POA: Diagnosis not present

## 2024-02-21 DIAGNOSIS — H2511 Age-related nuclear cataract, right eye: Secondary | ICD-10-CM | POA: Diagnosis not present

## 2024-02-21 DIAGNOSIS — Z961 Presence of intraocular lens: Secondary | ICD-10-CM | POA: Diagnosis not present

## 2024-02-21 DIAGNOSIS — H40013 Open angle with borderline findings, low risk, bilateral: Secondary | ICD-10-CM | POA: Diagnosis not present

## 2024-02-21 DIAGNOSIS — H04123 Dry eye syndrome of bilateral lacrimal glands: Secondary | ICD-10-CM | POA: Diagnosis not present

## 2024-02-23 ENCOUNTER — Other Ambulatory Visit: Payer: Self-pay | Admitting: Internal Medicine

## 2024-02-27 ENCOUNTER — Other Ambulatory Visit: Payer: Self-pay | Admitting: Internal Medicine

## 2024-02-28 ENCOUNTER — Telehealth: Admitting: Physician Assistant

## 2024-02-29 ENCOUNTER — Encounter: Payer: Self-pay | Admitting: Internal Medicine

## 2024-03-13 ENCOUNTER — Encounter: Payer: Self-pay | Admitting: Radiology

## 2024-03-16 ENCOUNTER — Ambulatory Visit: Payer: Medicare PPO

## 2024-03-16 DIAGNOSIS — I428 Other cardiomyopathies: Secondary | ICD-10-CM

## 2024-03-16 LAB — CUP PACEART REMOTE DEVICE CHECK
Battery Remaining Longevity: 9 mo
Battery Voltage: 2.86 V
Brady Statistic RV Percent Paced: 0.03 %
Date Time Interrogation Session: 20251106043823
HighPow Impedance: 60 Ohm
Implantable Lead Connection Status: 753985
Implantable Lead Implant Date: 20140530
Implantable Lead Location: 753860
Implantable Lead Model: 6935
Implantable Pulse Generator Implant Date: 20140530
Lead Channel Impedance Value: 361 Ohm
Lead Channel Impedance Value: 418 Ohm
Lead Channel Pacing Threshold Amplitude: 0.625 V
Lead Channel Pacing Threshold Pulse Width: 0.4 ms
Lead Channel Sensing Intrinsic Amplitude: 6.875 mV
Lead Channel Sensing Intrinsic Amplitude: 6.875 mV
Lead Channel Setting Pacing Amplitude: 2.5 V
Lead Channel Setting Pacing Pulse Width: 0.4 ms
Lead Channel Setting Sensing Sensitivity: 0.3 mV

## 2024-03-17 ENCOUNTER — Ambulatory Visit: Payer: Self-pay | Admitting: Internal Medicine

## 2024-03-20 NOTE — Progress Notes (Signed)
 Remote ICD Transmission

## 2024-03-31 ENCOUNTER — Other Ambulatory Visit: Payer: Self-pay | Admitting: Internal Medicine

## 2024-04-03 ENCOUNTER — Encounter: Payer: Self-pay | Admitting: Internal Medicine

## 2024-04-03 ENCOUNTER — Encounter: Payer: Self-pay | Admitting: Physician Assistant

## 2024-04-04 ENCOUNTER — Encounter: Payer: Self-pay | Admitting: Internal Medicine

## 2024-04-05 ENCOUNTER — Encounter: Payer: Self-pay | Admitting: Internal Medicine

## 2024-04-12 ENCOUNTER — Encounter: Payer: Self-pay | Admitting: Physician Assistant

## 2024-04-13 ENCOUNTER — Encounter: Payer: Self-pay | Admitting: Internal Medicine

## 2024-04-13 NOTE — Progress Notes (Unsigned)
    Subjective:    Patient ID: [name], male    DOB: 03-15-1951, 73 y.o.   MRN: [id number]      HPI [name] is here for No chief complaint on file.        Medications and allergies reviewed with patient and updated if appropriate.  Current Outpatient Medications on File Prior to Visit  Medication Sig Dispense Refill   acetaminophen  (TYLENOL ) 500 MG tablet Take 1,000 mg by mouth every 6 (six) hours as needed for mild pain.     atorvastatin  (LIPITOR) 20 MG tablet TAKE 1 TABLET DAILY EXCEPT TAKE 1/2 TABLET ON TUESDAY, THURSDAY, AND SATURDAYS 72 tablet 3   carvedilol  (COREG ) 25 MG tablet Take 0.5 tablets (12.5 mg total) by mouth 2 (two) times daily with a meal. 180 tablet 3   Cholecalciferol  (VITAMIN D ) 50 MCG (2000 UT) tablet Take 2,000 Units by mouth daily.     clopidogrel  (PLAVIX ) 75 MG tablet Take 1 tablet (75 mg total) by mouth daily. 90 tablet 0   cyanocobalamin  (VITAMIN B12) 1000 MCG tablet TAKE 1 TABLET EVERY DAY (Patient taking differently: Take 1,000 mcg by mouth every Monday, Wednesday, and Friday.) 90 tablet 3   digoxin  (LANOXIN ) 0.125 MG tablet TAKE 1 TABLET EVERY DAY 90 tablet 3   docusate sodium  (COLACE) 100 MG capsule Take 1 capsule (100 mg total) by mouth 2 (two) times daily as needed for mild constipation. 60 capsule 2   donepezil  (ARICEPT ) 10 MG tablet TAKE 1 TABLET EVERY DAY 90 tablet 3   famotidine  (PEPCID ) 20 MG tablet TAKE 1 TABLET EVERY DAY 90 tablet 3   JANUMET  50-1000 MG tablet TAKE 1 TABLET EVERY DAY WITH A MEAL 90 tablet 3   latanoprost (XALATAN) 0.005 % ophthalmic solution Place 1 drop into both eyes at bedtime.     losartan  (COZAAR ) 25 MG tablet TAKE 1 TABLET AT BEDTIME 90 tablet 3   meclizine  (ANTIVERT ) 25 MG tablet Take 1 tablet (25 mg total) by mouth 3 (three) times daily as needed for dizziness. 30 tablet 0   memantine  (NAMENDA ) 5 MG tablet Take one tab twice a day 180 tablet 3   NON FORMULARY Pt uses a cpap nightly     OVER THE COUNTER  MEDICATION Apply 1 Application topically as needed (pain). Nervine Cream     pantoprazole  (PROTONIX ) 40 MG tablet TAKE 1 TABLET EVERY DAY 90 tablet 3   SODIUM FLUORIDE 5000 SENSITIVE 1.1-5 % GEL Place 1 Application onto teeth daily.     sotalol  (BETAPACE ) 120 MG tablet TAKE 1 TABLET TWICE DAILY 180 tablet 3   No current facility-administered medications on file prior to visit.    Review of Systems     Objective:  There were no vitals filed for this visit. BP Readings from Last 3 Encounters:  01/13/24 120/70  01/06/24 121/76  11/08/23 138/82   Wt Readings from Last 3 Encounters:  01/13/24 171 lb 3.2 oz (77.7 kg)  01/04/24 160 lb 0.9 oz (72.6 kg)  10/25/23 170 lb (77.1 kg)   There is no height or weight on file to calculate BMI.    Physical Exam         Assessment & Plan:    See Problem List for Assessment and Plan of chronic medical problems.

## 2024-04-14 ENCOUNTER — Ambulatory Visit: Payer: Self-pay | Admitting: Internal Medicine

## 2024-04-14 ENCOUNTER — Ambulatory Visit: Admitting: Internal Medicine

## 2024-04-14 ENCOUNTER — Encounter: Payer: Self-pay | Admitting: Internal Medicine

## 2024-04-14 VITALS — BP 114/68 | HR 61 | Temp 98.0°F

## 2024-04-14 DIAGNOSIS — R195 Other fecal abnormalities: Secondary | ICD-10-CM

## 2024-04-14 DIAGNOSIS — R3989 Other symptoms and signs involving the genitourinary system: Secondary | ICD-10-CM

## 2024-04-14 DIAGNOSIS — R109 Unspecified abdominal pain: Secondary | ICD-10-CM

## 2024-04-14 DIAGNOSIS — I152 Hypertension secondary to endocrine disorders: Secondary | ICD-10-CM

## 2024-04-14 DIAGNOSIS — R1032 Left lower quadrant pain: Secondary | ICD-10-CM

## 2024-04-14 DIAGNOSIS — R194 Change in bowel habit: Secondary | ICD-10-CM

## 2024-04-14 LAB — URINALYSIS, ROUTINE W REFLEX MICROSCOPIC
Hgb urine dipstick: NEGATIVE
Leukocytes,Ua: NEGATIVE
Nitrite: NEGATIVE
Specific Gravity, Urine: >=1.03 — AB (ref 1.000–1.030)
Total Protein, Urine: 100 — AB
Urine Glucose: NEGATIVE
Urobilinogen, UA: 1 (ref 0.0–1.0)
pH: 6 (ref 5.0–8.0)

## 2024-04-14 LAB — COMPREHENSIVE METABOLIC PANEL WITH GFR
ALT: 11 U/L (ref 0–53)
AST: 14 U/L (ref 0–37)
Albumin: 4.3 g/dL (ref 3.5–5.2)
Alkaline Phosphatase: 82 U/L (ref 39–117)
BUN: 8 mg/dL (ref 6–23)
CO2: 33 meq/L — ABNORMAL HIGH (ref 19–32)
Calcium: 9.5 mg/dL (ref 8.4–10.5)
Chloride: 104 meq/L (ref 96–112)
Creatinine, Ser: 0.89 mg/dL (ref 0.40–1.50)
GFR: 85.38 mL/min (ref >60.00)
Glucose, Bld: 106 mg/dL — ABNORMAL HIGH (ref 70–99)
Potassium: 4 meq/L (ref 3.5–5.1)
Sodium: 143 meq/L (ref 135–145)
Total Bilirubin: 0.8 mg/dL (ref 0.2–1.2)
Total Protein: 7.1 g/dL (ref 6.0–8.3)

## 2024-04-14 LAB — CBC WITH DIFFERENTIAL/PLATELET
Basophils Absolute: 0 K/uL (ref 0.0–0.1)
Basophils Relative: 0.2 % (ref 0.0–3.0)
Eosinophils Absolute: 0.1 K/uL (ref 0.0–0.7)
Eosinophils Relative: 1.2 % (ref 0.0–5.0)
HCT: 37.2 % — ABNORMAL LOW (ref 39.0–52.0)
Hemoglobin: 11.9 g/dL — ABNORMAL LOW (ref 13.0–17.0)
Lymphocytes Relative: 30.2 % (ref 12.0–46.0)
Lymphs Abs: 1.3 K/uL (ref 0.7–4.0)
MCHC: 31.9 g/dL (ref 30.0–36.0)
MCV: 73 fl — ABNORMAL LOW (ref 78.0–100.0)
Monocytes Absolute: 0.3 K/uL (ref 0.1–1.0)
Monocytes Relative: 8.1 % (ref 3.0–12.0)
Neutro Abs: 2.6 K/uL (ref 1.4–7.7)
Neutrophils Relative %: 60.3 % (ref 43.0–77.0)
Platelets: 199 K/uL (ref 150.0–400.0)
RBC: 5.1 Mil/uL (ref 4.22–5.81)
RDW: 15.5 % (ref 11.5–15.5)
WBC: 4.2 K/uL (ref 4.0–10.5)

## 2024-04-14 LAB — AMYLASE: Amylase: 81 U/L (ref 27–131)

## 2024-04-14 LAB — LIPASE: Lipase: 28 U/L (ref 11.0–59.0)

## 2024-04-14 NOTE — Addendum Note (Signed)
 Addended by: [name] on: 04/14/2024 08:03 PM   Modules accepted: Level of Service

## 2024-04-14 NOTE — Assessment & Plan Note (Signed)
 Chronic Blood pressure well controlled  Monitor BP at home Continue Coreg  12.5 mg twice daily, losartan  25 mg daily, sotalol  120 mg twice daily

## 2024-04-15 LAB — URINE CULTURE: Result:: NO GROWTH

## 2024-04-16 ENCOUNTER — Other Ambulatory Visit: Payer: Self-pay | Admitting: Internal Medicine

## 2024-04-17 ENCOUNTER — Inpatient Hospital Stay
Admission: RE | Admit: 2024-04-17 | Discharge: 2024-04-17 | Disposition: A | Source: Ambulatory Visit | Attending: Internal Medicine

## 2024-04-17 ENCOUNTER — Encounter: Payer: Self-pay | Admitting: Internal Medicine

## 2024-04-17 DIAGNOSIS — R194 Change in bowel habit: Secondary | ICD-10-CM

## 2024-04-17 DIAGNOSIS — N281 Cyst of kidney, acquired: Secondary | ICD-10-CM | POA: Diagnosis not present

## 2024-04-17 DIAGNOSIS — R1032 Left lower quadrant pain: Secondary | ICD-10-CM

## 2024-04-17 MED ORDER — IOPAMIDOL (ISOVUE-300) INJECTION 61%
100.0000 mL | Freq: Once | INTRAVENOUS | Status: AC | PRN
  Administered 2024-04-17: 100 mL via INTRAVENOUS

## 2024-04-30 ENCOUNTER — Other Ambulatory Visit: Payer: Self-pay | Admitting: Internal Medicine

## 2024-05-15 ENCOUNTER — Encounter: Payer: Self-pay | Admitting: Podiatry

## 2024-05-15 ENCOUNTER — Ambulatory Visit (INDEPENDENT_AMBULATORY_CARE_PROVIDER_SITE_OTHER): Admitting: Podiatry

## 2024-05-15 DIAGNOSIS — M79675 Pain in left toe(s): Secondary | ICD-10-CM | POA: Diagnosis not present

## 2024-05-15 DIAGNOSIS — M79674 Pain in right toe(s): Secondary | ICD-10-CM | POA: Diagnosis not present

## 2024-05-15 DIAGNOSIS — B351 Tinea unguium: Secondary | ICD-10-CM | POA: Diagnosis not present

## 2024-05-15 DIAGNOSIS — E0842 Diabetes mellitus due to underlying condition with diabetic polyneuropathy: Secondary | ICD-10-CM

## 2024-05-15 NOTE — Progress Notes (Signed)

## 2024-05-16 NOTE — Progress Notes (Signed)
 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 RH male with a history of hypertension, ventricular tachycardia s/p MI , cardiomyopathy, chronic heart failure, aortic atherosclerosis, hyperlipidemia, Type II diabetes with polyneuropathy, and obstructive sleep apnea, B12 deficiency and a diagnosis of mixed dementia of unclear etiology, concern for vascular per neuropsych evaluation January 2025 seen today in follow up for memory loss. Prior biomarkers did not yield a formal diagnosis although the patient may be at moderate risk for developing Alzheimer's disease.  Patient is currently on donepezil  10 mg daily and memantine  which had to be reduced to every other day by his wife, as he developed significant diarrhea.  We discussed utilizing fiber daily as a bulking agent, as a strategy for better tolerance of memantine  in an effort to increase the frequency to daily and even twice a day if possible given his worsening memory.  His MMSE today is 15/30, significant decrease since last visit.  This patient is accompanied in the office by his wife who supplements the history.  Previous records as well as any outside records available were reviewed prior to todays visit. Patient was last seen on 10/25/2023.  Patient is able to participate on ADLs although he needs more assistance.  His mobility has decreased due to deconditioning, will order referral to PT for strength and balance.  He no longer drives.    Follow-up in 2 months Continue donepezil  10 mg daily and memantine  5 mg every other day for now, goal at least 2 to twice daily and if possible to increase it to 10 mg twice a day if tolerated.  Recommend good control of cardiovascular risk factors.   Continue to control mood as per PCP   Discussed the use of AI scribe software for clinical note transcription with the patient, who gave verbal consent to proceed.  History of  Present Illness  [name] is a 74 year old male with dementia who presents with worsening memory.  He has been experiencing worsening memory issues, particularly with short-term memory, directions, and instructions. He struggles with operating the remote control and telephone, and often places items in unusual locations, such as putting a pull-up under the sink instead of in the trash. He recently lost his wallet, which he used to place on the nightstand due to fears of theft. He repeats himself frequently but does not wander and recognizes his home and neighborhood.  He experiences hallucinations at night as before, seeing people in the room who he does not recognize and who stay for a few minutes. He also has occasional nightmares, such as dreaming of a dog attacking him, and sometimes wakes up frightened.  He has a history of neuropathy and lower back pain following a fall, which has affected his mobility. He drags his feet and describes them as feeling heavy. No tremors, and he has not had recent falls or head injuries. He wears a pad due to urinary incontinence following prostate cancer surgery and experiences stool incontinence only when taking memantine .  On August 2025 he was hospitalized for benign positional vertigo in August, during which his carvedilol  dose was reduced and he received vestibular therapy. The vertigo has since resolved.  He is currently taking donepezil  10 mg daily without issues. Memantine  was reduced to once every other day due to severe diarrhea, which resolved upon stopping the medication. He does not take any medication for urinary  incontinence.     Neuropsych evaluation 05/2023  Briefly, results suggested diffuse and generally quite severe cognitive impairment. Said impairment was exhibited across processing speed, complex attention, executive functioning, receptive language, confrontation naming, visuospatial abilities (outside of his figure copy), and  essentially all aspects of learning and memory. Relative to his previous evaluations in December 2022 and December 2023, further decline was exhibited across essentially all aspects of memory. This was particularly noteworthy across yes/no recognition trials, as performance worsened across all tasks relative to his initial 2022 evaluation, suggesting a greater storage impairment. The underlying etiology continues to be difficult to pinpoint. 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 Neurologically speaking, the presence of an underlying neurodegenerative illness continues to appear likely. Differentiating this illness continues to be challenging due to largely diffuse cognitive impairment.  While underlying Alzheimer's disease may be most likely given memory performances and population base rates, I am unable to conclusively determine this as there remains much symptom overlap between conditions (e.g., this and Lewy body disease and/or vascular contributions). Continued medical monitoring will be important moving forward.    Plasma biomarkers January 2025:    APOE E3/E4   [id number] 0.79 (normal )         05/18/2024   12:00 PM 10/25/2023   12:00 PM 11/13/2022    9:00 AM  MMSE - Mini Mental State Exam  Orientation to time 1 1 2   Orientation to Place 2 5 3   Registration 3 3 3   Attention/ Calculation 0 3 2  Recall 1 1 3   Language- name 2 objects 2 2 2   Language- repeat 1 1 1   Language- follow 3 step command 3 3 3   Language- read & follow direction 1 1 1   Write a sentence 1 1 1   Copy design 0 0 0  Total score 15 21 21       12/05/2020   10:00 AM  Montreal Cognitive Assessment   Visuospatial/ Executive (0/5) 3  Naming (0/3) 2  Attention: Read list of digits (0/2) 2  Attention: Read list of letters (0/1) 1  Attention: Serial 7 subtraction starting at 100 (0/3) 1  Language: Repeat phrase (0/2) 0  Language : Fluency (0/1) 0  Abstraction (0/2) 0   Delayed Recall (0/5) 1  Orientation (0/6) 6  Total 16      Objective:    Neurological Exam:    VITALS:   Vitals:   05/18/24 1126  BP: 112/72  Pulse: 65  Resp: 20  SpO2: 98%  Weight: 177 lb (80.3 kg)    GEN:  The patient appears stated age and is in NAD. HEENT:  Normocephalic, atraumatic.   Neurological examination:  General: NAD, well-groomed, appears stated age. Orientation: The patient is alert. Oriented to person, not to place and date Cranial nerves: There is good facial symmetry.The speech is fluent and clear. No aphasia or dysarthria. Fund of knowledge is reduced. Recent and remote memory are impaired. Attention and concentration are reduced. Able to name objects and repeat phrases.  Hearing is intact to conversational tone.   Sensation: Sensation is intact to light touch throughout Motor: Strength is at least antigravity x4. DTR's 2/4 in UE/LE     Movement examination:  Tone: There is normal tone in the UE/LE Abnormal movements:  no tremor.  No myoclonus.  No asterixis.   Coordination:  There is some decremation with RAM's.  Abnormal  finger to nose  Gait and Station: The patient has some difficulty  arising out of a deep-seated chair without the use of the hands. The patient's stride length is shorter than prior.  No ataxia is noted gait is cautious and narrow.    Thank you for allowing us  the opportunity to participate in the care of this nice patient. Please do not hesitate to contact us  for any questions or concerns.   Total time spent on today's visit was 28 minutes dedicated to this patient today, preparing to see patient, examining the patient, ordering tests and/or medications and counseling the patient, documenting clinical information in the EHR or other health record, independently interpreting results and communicating results to the patient/family, discussing treatment and goals, answering patient's questions and coordinating care.  Cc:  [name], MD  Camie Sevin 05/18/2024 12:34 PM      "

## 2024-05-18 ENCOUNTER — Encounter: Payer: Self-pay | Admitting: Physician Assistant

## 2024-05-18 ENCOUNTER — Ambulatory Visit: Admitting: Physician Assistant

## 2024-05-18 VITALS — BP 112/72 | HR 65 | Resp 20 | Wt 177.0 lb

## 2024-05-18 DIAGNOSIS — R2681 Unsteadiness on feet: Secondary | ICD-10-CM

## 2024-05-18 DIAGNOSIS — G309 Alzheimer's disease, unspecified: Secondary | ICD-10-CM | POA: Diagnosis not present

## 2024-05-18 DIAGNOSIS — F0150 Vascular dementia without behavioral disturbance: Secondary | ICD-10-CM | POA: Diagnosis not present

## 2024-05-18 DIAGNOSIS — F0280 Dementia in other diseases classified elsewhere without behavioral disturbance: Secondary | ICD-10-CM

## 2024-05-18 DIAGNOSIS — R413 Other amnesia: Secondary | ICD-10-CM

## 2024-05-18 DIAGNOSIS — R262 Difficulty in walking, not elsewhere classified: Secondary | ICD-10-CM

## 2024-05-18 NOTE — Patient Instructions (Addendum)
 It was a pleasure to see you today at our office.   Recommendations:     Follow up in person March 27 11:30  Continue donepezil  10 mg daily  Continue memantine  every other day, increase as tolerated with Fiber daily  PT for strength and balance     Whom to call:  Memory  decline, memory medications: Call our office [phone]   For psychiatric meds, mood meds: Please have your primary care physician manage these medications.       For assessment of decision of mental capacity and competency:  Call Dr. Rosaline Nine, geriatric psychiatrist at [phone]  For guidance in geriatric dementia issues please call [facility] [phone]     If you have any severe symptoms of a stroke, or other severe issues such as confusion,severe chills or fever, etc call 911 or go to the ER as you may need to be evaluated further        RECOMMENDATIONS FOR ALL PATIENTS WITH MEMORY PROBLEMS: 1. Continue to exercise (Recommend 30 minutes of walking everyday, or 3 hours every week) 2. Increase social interactions - continue going to [place] and enjoy social gatherings with friends and family 3. Eat healthy, avoid fried foods and eat more fruits and vegetables 4. Maintain adequate blood pressure, blood sugar, and blood cholesterol level. Reducing the risk of stroke and cardiovascular disease also helps promoting better memory. 5. Avoid stressful situations. Live a simple life and avoid aggravations. Organize your time and prepare for the next day in anticipation. 6. Sleep well, avoid any interruptions of sleep and avoid any distractions in the bedroom that may interfere with adequate sleep quality 7. Avoid sugar, avoid sweets as there is a strong link between excessive sugar intake, diabetes, and cognitive impairment We discussed the Mediterranean diet, which has been shown to help patients reduce the risk of progressive memory disorders and reduces cardiovascular risk. This includes  eating fish, eat fruits and green leafy vegetables, nuts like almonds and hazelnuts, walnuts, and also use olive oil. Avoid fast foods and fried foods as much as possible. Avoid sweets and sugar as sugar use has been linked to worsening of memory function.  There is always a concern of gradual progression of memory problems. If this is the case, then we may need to adjust level of care according to patient needs. Support, both to the patient and caregiver, should then be put into place.    FALL PRECAUTIONS: Be cautious when walking. Scan the area for obstacles that may increase the risk of trips and falls. When getting up in the mornings, sit up at the edge of the bed for a few minutes before getting out of bed. Consider elevating the bed at the head end to avoid drop of blood pressure when getting up. Walk always in a well-lit room (use night lights in the walls). Avoid area rugs or power cords from appliances in the middle of the walkways. Use a walker or a cane if necessary and consider physical therapy for balance exercise. Get your eyesight checked regularly.  FINANCIAL OVERSIGHT: Supervision, especially oversight when making financial decisions or transactions is also recommended.  HOME SAFETY: Consider the safety of the kitchen when operating appliances like stoves, microwave oven, and blender. Consider having supervision and share cooking responsibilities until no longer able to participate in those. Accidents with firearms and other hazards in the house should be identified and addressed as well.   ABILITY TO BE LEFT ALONE: If patient is  unable to contact 911 operator, consider using [facility], or when the need is there, arrange for someone to stay with patients. Smoking is a fire hazard, consider supervision or cessation. Risk of wandering should be assessed by caregiver and if detected at any point, supervision and safe proof recommendations should be instituted.  MEDICATION SUPERVISION:  Inability to self-administer medication needs to be constantly addressed. Implement a mechanism to ensure safe administration of the medications.

## 2024-05-19 ENCOUNTER — Ambulatory Visit (HOSPITAL_BASED_OUTPATIENT_CLINIC_OR_DEPARTMENT_OTHER): Admitting: Pulmonary Disease

## 2024-05-19 ENCOUNTER — Encounter (HOSPITAL_BASED_OUTPATIENT_CLINIC_OR_DEPARTMENT_OTHER): Payer: Self-pay | Admitting: Pulmonary Disease

## 2024-05-19 VITALS — BP 163/79 | HR 66 | Ht 71.0 in | Wt 178.0 lb

## 2024-05-19 DIAGNOSIS — G4733 Obstructive sleep apnea (adult) (pediatric): Secondary | ICD-10-CM | POA: Diagnosis not present

## 2024-05-19 NOTE — Patient Instructions (Signed)
" °  VISIT SUMMARY: During today's visit, we discussed your recent weight loss and memory issues. We also reviewed your sleep apnea and its potential impact on your overall health and memory.  YOUR PLAN: -OBSTRUCTIVE SLEEP APNEA: Obstructive sleep apnea is a condition where your breathing stops and starts during sleep due to blocked upper airways. You have mild obstructive sleep apnea and have stopped using your CPAP machine. We will conduct a home sleep apnea test to assess the current severity of your condition. If the test results are negative, follow with neurologist to explore other potential causes of your memory decline, such as vascular dementia.  INSTRUCTIONS: Please complete the home sleep apnea test  We will review the results and determine the next steps based on the findings.                      Contains text generated by [employer].                                 Contains text generated by [employer].   "

## 2024-05-19 NOTE — Progress Notes (Signed)
 "  Subjective:    Patient ID: [name], male    DOB: Mar 13, 1951, 74 y.o.   MRN: [id number]   74 yo for follow-up of OSA and restless legs   PMH -chronic systolic heart failure status post AICD for VT,  hypertension,  diabetes type 2,  prostate cancer Restless leg syndrome Vascular dementia   OSA was diagnosed in 2005 but he could not tolerate CPAP.  We reinitiated CPAP in 2023 after split-night study He lost 40 pounds since his COVID infection in 2022   Discussed the use of AI scribe software for clinical note transcription with the patient, who gave verbal consent to proceed.  History of Present Illness [name] is a 74 year old male who presents for follow-up and evaluation of weight loss and memory issues.  He has had significant unintended weight loss since a COVID-19 infection and has not regained the weight, despite no change in diet per his wife, raising concern for overall health and reserve.  He has mild obstructive sleep apnea and stopped using CPAP. He reports subjectively better sleep with no dry mouth and no loud snoring. His wife notes that he sleeps with his mouth open and occasionally wakes up frightened or confused.  Over the past two years he has had progressive memory decline attributed to vascular disease on prior testing and is treated with Aricept  and Namenda .  He has nighttime episodes of confusion and disorientation, including going the wrong way when getting up to use the bathroom. He sometimes acts out dreams and sees people at night that startle him, which raises concern for disturbed sleep and safety during the night.    [name] last saw you in Jan 2025. When he did his last sleep study he weighed 190. He now weighs 169. His highest weight was 240. He has been diagnosed with memory loss. He wonders if he still needs to use the cpap   Reviewed Neuro logy note from 10/2023  CT abdomen 04/2024 neg  Significant tests/ events reviewed   08/2021  split-night study mild OSA AHI 9.5/hour, RDI 50/hour, minimum desaturation 90%  01/2004 NPSG - wt 235 lbs -RDI 37/hour corrected by CPAP 7 cm  Review of Systems  neg for any significant sore throat, dysphagia, itching, sneezing, nasal congestion or excess/ purulent secretions, fever, chills, sweats, unintended wt loss, pleuritic or exertional cp, hempoptysis, orthopnea pnd or change in chronic leg swelling. Also denies presyncope, palpitations, heartburn, abdominal pain, nausea, vomiting, diarrhea or change in bowel or urinary habits, dysuria,hematuria, rash, arthralgias, visual complaints, headache, numbness weakness or ataxia.      Objective:   Physical Exam  Gen. Pleasant, well-nourished, tall lean in no distress ENT - no thrush, no pallor/icterus,no post nasal drip Neck: No JVD, no thyromegaly, no carotid bruits Lungs: no use of accessory muscles, no dullness to percussion, clear without rales or rhonchi  Cardiovascular: Rhythm regular, heart sounds  normal, no murmurs or gallops, no peripheral edema Musculoskeletal: No deformities, no cyanosis or clubbing        Assessment & Plan:   Assessment and Plan Assessment & Plan Obstructive sleep apnea Mild obstructive sleep apnea with significant weight loss since last evaluation. No longer using CPAP machine. Sleep quality appears adequate, but occasional confusion and sleep disturbances are present. Differential includes contribution of sleep apnea to memory decline, though mild apnea is unlikely to cause significant cognitive issues. Weight loss may have improved apnea severity. - Ordered home sleep apnea test to assess  current severity of obstructive sleep apnea. - If home test is negative, will attribute REM BD symptoms to memory decline and potential vascular dementia.   Weight loss - CT abd neg , per PCP  HTN - per pCP, recheck     "

## 2024-05-23 ENCOUNTER — Other Ambulatory Visit: Payer: Self-pay | Admitting: Internal Medicine

## 2024-05-25 NOTE — Therapy (Signed)
 " OUTPATIENT PHYSICAL THERAPY NEURO EVALUATION   Patient Name: [name] MRN: [id number] DOB:24-Apr-1951, 74 y.o., male Today's Date: 05/31/2024   END OF SESSION:  PT End of Session - 05/31/24 0926     Visit Number 1    Date for Recertification  08/23/24    Authorization Type [employer] MCR    Authorization Time Period Auth pending    Authorization - Visit Number 1    PT Start Time [id number]    PT Stop Time 1048    PT Time Calculation (min) 82 min    Equipment Utilized During Treatment Gait belt    Activity Tolerance Patient tolerated treatment well    Behavior During Therapy WFL for tasks assessed/performed          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   Not compatible with MRI    Barrett's esophagus 09/05/2004   2 cm changes seen at index EGD   Cataract    Chronic back pain 04/25/2019   Chronic systolic heart failure    Colon polyps    tubular adenoma   Diabetic neuropathy 01/14/2022   Difficulty in walking 10/02/2021   Dizziness 01/19/2011   Dysrhythmia    Essential hypertension 08/26/2009   Fatigue 12/02/2020   Generalized anxiety disorder    since defibrillator placement   GERD (gastroesophageal reflux disease)    Hip pain 12/14/2016   History of COVID-19    History of myocardial infarction    Hyperlipidemia    Left knee pain 12/14/2016   Low back pain 05/14/2021   Malignant neoplasm of prostate 09/27/2014   s/p prostatectomy   Mild anemia 01/20/2011   Mixed dementia 04/27/2022   Neuralgia 10/19/2018   NICM (nonischemic cardiomyopathy)    a.normal cors by cath in 2005. b. s/p Medtronic ICD.   OSA (obstructive sleep apnea) 11/10/2012   regular CPAP use   Pain due to onychomycosis of toenails of both feet 02/23/2022   Pain in the coccyx 01/28/2022   Rectal pain 12/21/2021   Right foot pain 12/15/2017   RLS (restless legs  syndrome) 11/29/2012   S/P radiation therapy    10/10/2014 through 11/26/2014; Prostate bed 6600 cGy in 33 sessions   Shingles 07/28/2016   TIA (transient ischemic attack) 01/05/2022   Type II diabetes mellitus 08/26/2009   Ventricular tachycardia    a. s/p ICD (generator change 09/2012). b. 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 and placed on sotalol    Visual hallucinations    Vitamin B12 deficiency 03/06/2011   Monthly B12 shots initiated  2011. Nasally  inhaled B12 as of 07/2012   Past Surgical History:  Procedure Laterality Date   CARDIAC DEFIBRILLATOR PLACEMENT  03/11/2004   Medtronic Maximo single lead   CATARACT EXTRACTION     COLONOSCOPY W/ BIOPSIES AND POLYPECTOMY  08/2004, 02/24/11   6mm adenoma in 2006,  5 mm polyp (not recovered) 2012   CYSTOSCOPY WITH DIRECT VISION INTERNAL URETHROTOMY N/A 10/08/2015   Procedure: CYSTOSCOPY WITH DIRECT VISION INTERNAL URETHROTOMY;  Surgeon: Redell Lynwood Napoleon, MD;  Location: [facility];  Service: Urology;  Laterality: N/A;   CYSTOSCOPY WITH URETHRAL DILATATION N/A 04/15/2023   Procedure: CYSTOSCOPY AND ROSELEE WITH OPTILUME URETHRAL DILATATION;  Surgeon: Cam Morene ORN, MD;  Location: [facility];  Service: Urology;  Laterality: N/A;  60 MINUTES   IMPLANTABLE CARDIOVERTER DEFIBRILLATOR GENERATOR CHANGE N/A 10/07/2012   Procedure: IMPLANTABLE CARDIOVERTER  DEFIBRILLATOR GENERATOR CHANGE;  Surgeon: Danelle LELON Birmingham, MD;  Location: [facility];  Service: Cardiovascular;  Laterality: N/A;   PROSTATECTOMY  06/12/2003   Dr Aleene   RECTAL SURGERY     UPPER GASTROINTESTINAL ENDOSCOPY  08/2004, 02/24/11   Barrett's esophagus   Patient Active Problem List   Diagnosis Date Noted   Diabetes mellitus treated with oral medication (HCC) 01/13/2024   Vertigo 01/04/2024   Lightheadedness 10/15/2023   Gross hematuria 02/09/2023   Diarrhea 07/14/2022   Poor balance 07/14/2022   Lumbosacral radiculopathy at L5 07/14/2022   Mixed dementia 04/27/2022   Visual hallucinations     Pain due to onychomycosis of toenails of both feet 02/23/2022   Pain in the coccyx 01/28/2022   Diabetic neuropathy 01/14/2022   History of TIA (transient ischemic attack) 01/05/2022   Rectal pain 12/21/2021   Difficulty in walking 10/02/2021   Low back pain 05/14/2021   Generalized anxiety disorder 04/18/2021   Dysrhythmia 04/18/2021   History of myocardial infarction 04/18/2021   History of COVID-19    Aortic atherosclerosis 12/05/2020   Fatigue 12/02/2020   Shingles 07/28/2016   GERD (gastroesophageal reflux disease) 07/29/2015   Malignant neoplasm of prostate 09/27/2014   RLS (restless legs syndrome) 11/29/2012   OSA (obstructive sleep apnea) 11/10/2012   Vitamin B12 deficiency 03/06/2011   Chronic systolic heart failure 01/27/2011   Mild anemia 01/20/2011   Dizziness 01/19/2011   History of ventricular tachycardia    NICM (nonischemic cardiomyopathy)    Type II diabetes mellitus 08/26/2009   Hyperlipidemia associated with type 2 diabetes mellitus (HCC) 08/26/2009   Hypertension associated with type 2 diabetes mellitus (HCC) 08/26/2009   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   Barrett's esophagus 09/05/2004    PCP: [name], MD   REFERRING PROVIDER: Dina Camie BRAVO, PA-C   REFERRING DIAG:  R26.81 (ICD-10-CM) - Gait instability  R26.2 (ICD-10-CM) - Difficulty in walking  R41.3 (ICD-10-CM) - Memory impairment   THERAPY DIAG:  Unsteadiness on feet  History of falling  Muscle weakness (generalized)  Difficulty in walking, not elsewhere classified  RATIONALE FOR EVALUATION AND TREATMENT: Rehabilitation  ONSET DATE: Worsening over the last 6 months  NEXT MD VISIT: 07/18/2024   SUBJECTIVE:                                                                                                                                                                                                         SUBJECTIVE STATEMENT: Pt referred for  increasing gait instability, getting worse over the last 6  months.  Testing revealed neuropathy in his feet and legs.  His wife reports 1 fall last week when he missed the chair while sitting down, landing on his R shoulder - sore the next day but no further c/o.  He does not use any AD currently but wife reports she feels like she needs to hold onto to him.    Pt accompanied by: significant other  PAIN: Are you having pain? No  PERTINENT HISTORY:  Severe cognitive impairment (as indicated neuropsych eval below), diagnosis of mixed dementia of unclear etiology, HTN, ventricular tachycardia s/p MI, ICD, cardiomyopathy, chronic heart failure, aortic atherosclerosis, HLD, DM-II with polyneuropathy, OSA, B12 deficiency, LBP s/p fall, urinary incontinence, hospitalization for BPPV 12/2023 - vertigo now resolved, orthostatic hypotension  Neuropsych evaluation 05/2023  Briefly, results suggested diffuse and generally quite severe cognitive impairment. Said impairment was exhibited across processing speed, complex attention, executive functioning, receptive language, confrontation naming, visuospatial abilities (outside of his figure copy), and essentially all aspects of learning and memory. Relative to his previous evaluations in December 2022 and December 2023, further decline was exhibited across essentially all aspects of memory. This was particularly noteworthy across yes/no recognition trials, as performance worsened across all tasks relative to his initial 2022 evaluation, suggesting a greater storage impairment. The underlying etiology continues to be difficult to pinpoint. 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 Neurologically speaking, the presence of an underlying neurodegenerative illness continues to appear likely. Differentiating this illness continues to be challenging due to largely diffuse cognitive impairment.  While underlying Alzheimer's disease may  be most likely given memory performances and population base rates, I am unable to conclusively determine this as there remains much symptom overlap between conditions (e.g., this and Lewy body disease and/or vascular contributions). Continued medical monitoring will be important moving forward.   PRECAUTIONS: Fall and ICD/Pacemaker   RED FLAGS: None  WEIGHT BEARING RESTRICTIONS: No  FALLS:  Has patient fallen in last 6 months? Yes. Number of falls 1  LIVING ENVIRONMENT: Lives with: lives with their spouse Lives in: House Stairs: Yes: Internal: 14 steps; on right going up, on left going up, and can reach both and External: 3-4 steps; on right going up, on left going up, and bilateral but cannot reach both Has following equipment at home: Single point cane, Walker - 2 wheeled, Walker - 4 wheeled, shower chair, bed side commode, and Grab bars  OCCUPATION: Retired  PLOF: Needs assistance with ADLs, Needs assistance with homemaking, and Leisure: Mostly mostly sedentary recently but used to play tennis Patient is able to participate on ADLs although he needs more assistance (esp with LB dressing).  His mobility has decreased due to deconditioning.  He no longer drives.    PATIENT GOALS: To feel more steady.   OBJECTIVE: (objective measures completed at initial evaluation unless otherwise dated)  DIAGNOSTIC FINDINGS:  12/18/21 - EMG & NCV Findings: Evaluation of the left tibial motor and the right superficial fibular sensory nerves showed reduced amplitude (L0.3, R2.4 V).  The right tibial motor nerve showed prolonged distal onset latency (6.3 ms) and reduced amplitude (0.3 mV).  The left superficial fibular sensory nerve showed prolonged distal peak latency (4.8 ms) and decreased conduction velocity (14 cm-Ant Lat Mall, 29 m/s).  The left sural sensory and the right sural sensory nerves showed no response (Calf).  All remaining nerves (as indicated in the following tables) were within  normal limits.  Left vs. Right side comparison data  for the tibial motor nerve indicates abnormal L-R latency difference (1.8 ms) and abnormal L-R velocity difference (Knee-Ankle, 11 m/s).     Needle evaluation of the left anterior tibialis and the left Fibularis Longus muscles showed increased insertional activity.  All remaining muscles (as indicated in the following table) showed no evidence of electrical instability.     Impression: The above electrodiagnostic study is ABNORMAL and reveals evidence of mild chronic L5 radiculopathy on the left.     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    There is no significant electrodiagnostic evidence of any other focal nerve entrapment or lumbosacral plexopathy.    Recommendations: 1.  Follow-up with referring physician. 2.  Continue current management of symptoms.  COGNITION: Overall cognitive status: Impaired: Areas of impairment:  Memory: Impaired: Immediate Working Short term Long term Awareness: Impaired: Intellectual Executive function: Impaired: Problem solving and Slow processing    SENSATION: Peripheral neuropathy  COORDINATION: Heel-toe WFL Heel to shin impaired  POSTURE:  rounded shoulders, forward head, and increased thoracic kyphosis  LOWER EXTREMITY ROM:    Grossly WFL  LOWER EXTREMITY MMT:  (tested in sitting on eval)  MMT Right eval Left eval  Hip flexion 4- 4-  Hip extension 4- 4-  Hip abduction 4 4  Hip adduction 4 4  Hip internal rotation 4 4  Hip external rotation 4- 4-  Knee flexion 4 4  Knee extension 4 4  Ankle dorsiflexion 4 4  Ankle plantarflexion    Ankle inversion    Ankle eversion    (Blank rows = not tested)  TRANSFERS: Assistive device utilized: None  Sit to stand: Modified independence and SBA Stand to sit: Modified independence and SBA Chair to chair: SBA Floor: NT  GAIT: Distance walked: clinic  distances Assistive device utilized: Environmental Consultant - 2 wheeled, Environmental Consultant - 4 wheeled, and None Level of assistance: SBA and CGA Gait pattern: lateral hip instability and trunk flexed with increased sway/staggering with gait w/o AD  STAIRS:  Level of Assistance: CGA  Stair Negotiation Technique: Alternating Pattern on ascent and Step to Pattern on descent Forwards with Single Rail on Right  Number of Stairs: 14   Height of Stairs: 7   FUNCTIONAL TESTS:  5 times sit to stand: 18.43 sec requiring B UE assist Timed up and go (TUG): 22.81 sec w/o AD 10 meter walk test: 15.37 sec w/o AD  Gait speed: 2.13 ft/sec w/o AD  PATIENT SURVEYS:  ABC scale: The Activities-Specific Balance Confidence (ABC) Scale 0% 10 20 30  40 50 60 70 80 90 100% No confidence<->completely confident  How confident are you that you will not lose your balance or become unsteady when you . . .  Date tested 05/31/2024   Walk around the house 30%  2. Walk up or down stairs 50%  3. Bend over and pick up a slipper from in front of a closet floor 40%  4. Reach for a small can off a shelf at eye level 50%  5. Stand on tip toes and reach for something above your head 20%  6. Stand on a chair and reach for something 0%  7. Sweep the floor 40%  8. Walk outside the house to a car parked in the driveway 40%  9. Get into or out of a car 40%  10. Walk across a parking lot to the mall 20%  11. Walk up or down a ramp 30%  12. Walk in a crowded mall where people rapidly walk past you 30%  13. Are bumped into by people as you walk through the mall 20%  14. Step onto or off of an escalator while you are holding onto the railing 10%  15. Step onto or off an escalator while holding onto parcels such that you cannot hold onto the railing 10%  16. Walk outside on icy sidewalks 0%  Total: #/16 430 / 1600 = 26.9 %  Level of physical functioning:  Low*  * <50% = low level physical functioning   <67% indicates risk of falls vestibular  disorders, older adults   TODAY'S TREATMENT:    05/31/2024 - Eval SELF CARE:   Reviewed eval findings and role of PT in addressing identified deficits as well as impact on risk for falls.  Gait training 29' each with [phone] and 2WW/RW providing instruction in safe hand placement during transfers, proper use of 4WW brakes, proper height adjustment of walker, good posture and walker proximity during gait and safe approach to destination.  Based on initial gait training, recommended use of 2WW/RW for home and community access to help reduce fall risk.   PATIENT EDUCATION:  Education details: PT eval findings, anticipated POC, standardized testing results and interpretation, and gait safety with 4WW vs 2WW/RW - latter recommended for home use  Person educated: Patient and Spouse Education method: Explanation, Demonstration, Verbal cues, and Tactile cues Education comprehension: verbalized understanding, returned demonstration, verbal cues required, tactile cues required, and needs further education  HOME EXERCISE PROGRAM: TBD   ASSESSMENT:  CLINICAL IMPRESSION: BLEASE CAPALDI is a 74 y.o. male who was referred to physical therapy for evaluation and treatment for gait instability, difficulty in walking and memory impairment.  Patient presents with physical impairments of B LE weakness, impaired activity tolerance, impaired standing balance, impaired ambulation, and decreased safety awareness impacting safe and independent functional mobility.  Examination revealed patient is at risk for falls and functional decline as evidenced by the following objective test measures: Gait speed 2.13 ft/sec w/o AD, (2.62 ft/sec is needed for community access), TUG of 22.81 sec w/o AD (>13.5 sec indicates increased risk for falls), and 5xSTS of 18.43 sec with need for B UE assist (>15 sec w/o UE assist indicates increased risk for falls and decreased BLE power).  ABC scale score of 26.9% indicates a low level of  physical functioning and score of <67% indicates risk of falls in older adults.  Devyn will benefit from skilled PT to address above deficits to improve mobility and activity tolerance to help reach the maximal level of functional independence and mobility. Patient demonstrates understanding of this POC and is in agreement with this plan.   OBJECTIVE IMPAIRMENTS: Abnormal gait, decreased activity tolerance, decreased balance, decreased cognition, decreased coordination, decreased endurance, decreased knowledge of condition, decreased knowledge of use of DME, decreased mobility, difficulty walking, decreased strength, improper body mechanics, and postural dysfunction.   ACTIVITY LIMITATIONS: standing, stairs, transfers, bed mobility, and locomotion level  PARTICIPATION LIMITATIONS: meal prep, cleaning, laundry, shopping, community activity, and yard work  PERSONAL FACTORS: Age, Behavior pattern, Fitness, Time since onset of injury/illness/exacerbation, and 3+ comorbidities: Severe cognitive impairment, diagnosis of mixed dementia of unclear etiology, HTN, ventricular tachycardia s/p MI, ICD, cardiomyopathy, chronic heart failure, aortic atherosclerosis, HLD, DM-II with polyneuropathy, OSA, B12 deficiency, LBP s/p fall, urinary incontinence, hospitalization for BPPV 12/2023 - vertigo now resolved, orthostatic hypotension are also affecting patient's functional outcome.   REHAB POTENTIAL: Fair due  to degree of cognitive impairment   CLINICAL DECISION MAKING: Unstable/unpredictable  EVALUATION COMPLEXITY: High   GOALS: Goals reviewed with patient? Yes (and wife)  SHORT TERM GOALS: Target date: 06/28/2024  Patient will be independent with initial HEP to improve outcomes and carryover.  Baseline: TBD Goal status: INITIAL  2.  Patient and wife will be educated on strategies to decrease risk of falls.  Baseline:  Goal status: INITIAL  3.  Patient will demonstrate safe transfers and gait with  2WW/RW to reduce risk for falls. Baseline:  Goal status: INITIAL  LONG TERM GOALS: Target date: 08/23/2024  Patient will be independent with advanced/ongoing HEP to facilitate ability to maintain/progress functional gains from skilled physical therapy services. Baseline:  Goal status: INITIAL  2.  Patient will be able to ambulate 600' with or w/o LRAD on variable surfaces with good safety to access community.  Baseline:  Goal status: INITIAL  3.  Patient will be able to step up/down curb safely with or w/o LRAD for safety with community ambulation.  Baseline:  Goal status: INITIAL   4.  Patient will demonstrate decreased TUG time to </= 18 sec to decrease risk for falls with transitional mobility. Baseline: 22.81 sec w/o AD Goal status: INITIAL  5.  Patient will improve 5xSTS time to </= 15 seconds w/o need for UE assist for improved efficiency and safety with transfers. Baseline: 18.43 sec Goal status: INITIAL   6.  Patient will demonstrate gait speed of >/= 2.62 ft/sec (0.8 m/s) with or w/o LRAD to be a safe community ambulator with decreased risk for recurrent falls.  Baseline: 2.13 ft/sec w/o AD Goal status: INITIAL  7.  Patient will improve Berg score to >/= 40/56 to improve safety and stability with ADLs in standing and reduce risk for falls. (MCID= 8 points)  Baseline: 31/56 Goal status: INITIAL  8.  Patient will report >/= 45% on ABC scale (MCID = 19%) to demonstrate improved balance confidence with functional mobility and gait. Baseline: 430 / 1600 = 26.9 % Goal status: INITIAL   PLAN:  PT FREQUENCY: 2x/week  PT DURATION: 12 weeks  PLANNED INTERVENTIONS: [id number]- PT Re-evaluation, 97750- Physical Performance Testing, 97110-Therapeutic exercises, 97530- Therapeutic activity, 97112- Neuromuscular re-education, 97535- Self Care, [id number]- Manual therapy, [phone]- Gait training, [phone]- Electrical stimulation (unattended), Patient/Family education, Balance training, Stair  training, DME instructions, Cryotherapy, and Moist heat  PLAN FOR NEXT SESSION: encourage wife participation/observation of treatment sessions to promote better home carryover - review safe transfers and gait with RW; fall prevention - home safety checklist; functional LE strengthening - possibly provide basic initial HEP depending on pt performance   Elijah CHRISTELLA Hidden, PT 05/31/2024, 1:53 PM  "

## 2024-05-31 ENCOUNTER — Ambulatory Visit: Attending: Physician Assistant | Admitting: Physical Therapy

## 2024-05-31 ENCOUNTER — Encounter: Payer: Self-pay | Admitting: Physical Therapy

## 2024-05-31 ENCOUNTER — Other Ambulatory Visit: Payer: Self-pay

## 2024-05-31 DIAGNOSIS — Z9181 History of falling: Secondary | ICD-10-CM | POA: Diagnosis present

## 2024-05-31 DIAGNOSIS — R413 Other amnesia: Secondary | ICD-10-CM | POA: Insufficient documentation

## 2024-05-31 DIAGNOSIS — R2681 Unsteadiness on feet: Secondary | ICD-10-CM | POA: Insufficient documentation

## 2024-05-31 DIAGNOSIS — M6281 Muscle weakness (generalized): Secondary | ICD-10-CM | POA: Diagnosis present

## 2024-05-31 DIAGNOSIS — R262 Difficulty in walking, not elsewhere classified: Secondary | ICD-10-CM | POA: Insufficient documentation

## 2024-06-06 ENCOUNTER — Ambulatory Visit

## 2024-06-08 ENCOUNTER — Ambulatory Visit: Admitting: Physical Therapy

## 2024-06-08 ENCOUNTER — Encounter: Payer: Self-pay | Admitting: Physical Therapy

## 2024-06-08 DIAGNOSIS — R2681 Unsteadiness on feet: Secondary | ICD-10-CM

## 2024-06-08 DIAGNOSIS — R262 Difficulty in walking, not elsewhere classified: Secondary | ICD-10-CM

## 2024-06-08 DIAGNOSIS — Z9181 History of falling: Secondary | ICD-10-CM

## 2024-06-08 DIAGNOSIS — M6281 Muscle weakness (generalized): Secondary | ICD-10-CM

## 2024-06-08 NOTE — Therapy (Signed)
 " OUTPATIENT PHYSICAL THERAPY TREATMENT   Patient Name: [name] MRN: [id number] DOB:[birth year], 74 y.o., male Today's Date: 06/08/2024   END OF SESSION:  PT End of Session - 06/08/24 0930     Visit Number 2    Date for Recertification  08/23/24    Authorization Type [employer] MCR    Authorization Time Period 05/31/24 - 07/20/24    Authorization - Visit Number 2    Authorization - Number of Visits 10    Progress Note Due on Visit 10    PT Start Time 0930    PT Stop Time 1016    PT Time Calculation (min) 46 min    Equipment Utilized During Treatment --    Activity Tolerance Patient tolerated treatment well    Behavior During Therapy [facility] for tasks assessed/performed           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   Not compatible with MRI    Barrett's esophagus 09/05/2004   2 cm changes seen at index EGD   Cataract    Chronic back pain 04/25/2019   Chronic systolic heart failure    Colon polyps    tubular adenoma   Diabetic neuropathy 01/14/2022   Difficulty in walking 10/02/2021   Dizziness 01/19/2011   Dysrhythmia    Essential hypertension 08/26/2009   Fatigue 12/02/2020   Generalized anxiety disorder    since defibrillator placement   GERD (gastroesophageal reflux disease)    Hip pain 12/14/2016   History of COVID-19    History of myocardial infarction    Hyperlipidemia    Left knee pain 12/14/2016   Low back pain 05/14/2021   Malignant neoplasm of prostate 09/27/2014   s/p prostatectomy   Mild anemia 01/20/2011   Mixed dementia 04/27/2022   Neuralgia 10/19/2018   NICM (nonischemic cardiomyopathy)    a.normal cors by cath in 2005. b. s/p Medtronic ICD.   OSA (obstructive sleep apnea) 11/10/2012   regular CPAP use   Pain due to onychomycosis of toenails of both feet 02/23/2022   Pain in the coccyx 01/28/2022   Rectal pain  12/21/2021   Right foot pain 12/15/2017   RLS (restless legs syndrome) 11/29/2012   S/P radiation therapy    10/10/2014 through 11/26/2014; Prostate bed 6600 cGy in 33 sessions   Shingles 07/28/2016   TIA (transient ischemic attack) 01/05/2022   Type II diabetes mellitus 08/26/2009   Ventricular tachycardia    a. s/p ICD (generator change 09/2012). b. 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 and placed on sotalol    Visual hallucinations    Vitamin B12 deficiency 03/06/2011   Monthly B12 shots initiated  2011. Nasally  inhaled B12 as of 07/2012   Past Surgical History:  Procedure Laterality Date   CARDIAC DEFIBRILLATOR PLACEMENT  03/11/2004   Medtronic Maximo single lead   CATARACT EXTRACTION     COLONOSCOPY W/ BIOPSIES AND POLYPECTOMY  08/2004, 02/24/11   6mm adenoma in 2006,  5 mm polyp (not recovered) 2012   CYSTOSCOPY WITH DIRECT VISION INTERNAL URETHROTOMY N/A 10/08/2015   Procedure: CYSTOSCOPY WITH DIRECT VISION INTERNAL URETHROTOMY;  Surgeon: Redell Lynwood Napoleon, MD;  Location: [facility];  Service: Urology;  Laterality: N/A;   CYSTOSCOPY WITH URETHRAL DILATATION N/A 04/15/2023   Procedure: CYSTOSCOPY AND ROSELEE WITH OPTILUME URETHRAL DILATATION;  Surgeon: Cam Morene ORN, MD;  Location: [facility];  Service: Urology;  Laterality:  N/A;  60 MINUTES   IMPLANTABLE CARDIOVERTER DEFIBRILLATOR GENERATOR CHANGE N/A 10/07/2012   Procedure: IMPLANTABLE CARDIOVERTER DEFIBRILLATOR GENERATOR CHANGE;  Surgeon: Danelle LELON Birmingham, MD;  Location[facility] CATH LAB;  Service: Cardiovascular;  Laterality: N/A;   PROSTATECTOMY  06/12/2003   Dr Aleene   RECTAL SURGERY     UPPER GASTROINTESTINAL ENDOSCOPY  08/2004, 02/24/11   Barrett's esophagus   Patient Active Problem List   Diagnosis Date Noted   Diabetes mellitus treated with oral medication (HCC) 01/13/2024   Vertigo 01/04/2024   Lightheadedness 10/15/2023   Gross hematuria 02/09/2023   Diarrhea 07/14/2022   Poor balance 07/14/2022   Lumbosacral radiculopathy at L5  07/14/2022   Mixed dementia 04/27/2022   Visual hallucinations    Pain due to onychomycosis of toenails of both feet 02/23/2022   Pain in the coccyx 01/28/2022   Diabetic neuropathy 01/14/2022   History of TIA (transient ischemic attack) 01/05/2022   Rectal pain 12/21/2021   Difficulty in walking 10/02/2021   Low back pain 05/14/2021   Generalized anxiety disorder 04/18/2021   Dysrhythmia 04/18/2021   History of myocardial infarction 04/18/2021   History of COVID-19    Aortic atherosclerosis 12/05/2020   Fatigue 12/02/2020   Shingles 07/28/2016   GERD (gastroesophageal reflux disease) 07/29/2015   Malignant neoplasm of prostate 09/27/2014   RLS (restless legs syndrome) 11/29/2012   OSA (obstructive sleep apnea) 11/10/2012   Vitamin B12 deficiency 03/06/2011   Chronic systolic heart failure 01/27/2011   Mild anemia 01/20/2011   Dizziness 01/19/2011   History of ventricular tachycardia    NICM (nonischemic cardiomyopathy)    Type II diabetes mellitus 08/26/2009   Hyperlipidemia associated with type 2 diabetes mellitus (HCC) 08/26/2009   Hypertension associated with type 2 diabetes mellitus (HCC) 08/26/2009   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   Barrett's esophagus 09/05/2004    PCP: [name], MD   REFERRING PROVIDER: Dina Camie BRAVO, PA-C   REFERRING DIAG:  R26.81 (ICD-10-CM) - Gait instability  R26.2 (ICD-10-CM) - Difficulty in walking  R41.3 (ICD-10-CM) - Memory impairment   THERAPY DIAG:  Unsteadiness on feet  History of falling  Muscle weakness (generalized)  Difficulty in walking, not elsewhere classified  RATIONALE FOR EVALUATION AND TREATMENT: Rehabilitation  ONSET DATE: Worsening over the last 6 months  NEXT MD VISIT: 07/18/2024   SUBJECTIVE:  SUBJECTIVE STATEMENT: Pt's wife reports they have tried the RW some at home but he needs further practice.   EVAL: Pt referred for increasing gait instability, getting worse over the last 6 months.  Testing revealed neuropathy in his feet and legs.  His wife reports 1 fall last week when he missed the chair while sitting down, landing on his R shoulder - sore the next day but no further c/o.  He does not use any AD currently but wife reports she feels like she needs to hold onto to him.    Pt accompanied by: significant other  PAIN: Are you having pain? No  PERTINENT HISTORY:  Severe cognitive impairment (as indicated neuropsych eval below), diagnosis of mixed dementia of unclear etiology, HTN, ventricular tachycardia s/p MI, ICD, cardiomyopathy, chronic heart failure, aortic atherosclerosis, HLD, DM-II with polyneuropathy, OSA, B12 deficiency, LBP s/p fall, urinary incontinence, hospitalization for BPPV 12/2023 - vertigo now resolved, orthostatic hypotension  Neuropsych evaluation 05/2023  Briefly, results suggested diffuse and generally quite severe cognitive impairment. Said impairment was exhibited across processing speed, complex attention, executive functioning, receptive language, confrontation naming, visuospatial abilities (outside of his figure copy), and essentially all aspects of learning and memory. Relative to his previous evaluations in December 2022 and December 2023, further decline was exhibited across essentially all aspects of memory. This was particularly noteworthy across yes/no recognition trials, as performance worsened across all tasks relative to his initial 2022 evaluation, suggesting a greater storage impairment. The underlying etiology continues to be difficult to pinpoint. 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 Neurologically speaking, the presence of an underlying neurodegenerative illness continues to  appear likely. Differentiating this illness continues to be challenging due to largely diffuse cognitive impairment.  While underlying Alzheimer's disease may be most likely given memory performances and population base rates, I am unable to conclusively determine this as there remains much symptom overlap between conditions (e.g., this and Lewy body disease and/or vascular contributions). Continued medical monitoring will be important moving forward.   PRECAUTIONS: Fall and ICD/Pacemaker   RED FLAGS: None  WEIGHT BEARING RESTRICTIONS: No  FALLS:  Has patient fallen in last 6 months? Yes. Number of falls 1  LIVING ENVIRONMENT: Lives with: lives with their spouse Lives in: House Stairs: Yes: Internal: 14 steps; on right going up, on left going up, and can reach both and External: 3-4 steps; on right going up, on left going up, and bilateral but cannot reach both Has following equipment at home: Single point cane, Walker - 2 wheeled, Walker - 4 wheeled, shower chair, bed side commode, and Grab bars  OCCUPATION: Retired  PLOF: Needs assistance with ADLs, Needs assistance with homemaking, and Leisure: Mostly mostly sedentary recently but used to play tennis Patient is able to participate on ADLs although he needs more assistance (esp with LB dressing).  His mobility has decreased due to deconditioning.  He no longer drives.    PATIENT GOALS: To feel more steady.   OBJECTIVE: (objective measures completed at initial evaluation unless otherwise dated)  DIAGNOSTIC FINDINGS:  12/18/21 - EMG & NCV Findings: Evaluation of the left tibial motor and the right superficial fibular sensory nerves showed reduced amplitude (L0.3, R2.4 V).  The right tibial motor nerve showed prolonged distal onset latency (6.3 ms) and reduced amplitude (0.3 mV).  The left superficial fibular sensory nerve showed prolonged distal peak latency (4.8 ms) and decreased conduction velocity (14 cm-Ant Lat Mall, 29 m/s).  The  left  sural sensory and the right sural sensory nerves showed no response (Calf).  All remaining nerves (as indicated in the following tables) were within normal limits.  Left vs. Right side comparison data for the tibial motor nerve indicates abnormal L-R latency difference (1.8 ms) and abnormal L-R velocity difference (Knee-Ankle, 11 m/s).     Needle evaluation of the left anterior tibialis and the left Fibularis Longus muscles showed increased insertional activity.  All remaining muscles (as indicated in the following table) showed no evidence of electrical instability.     Impression: The above electrodiagnostic study is ABNORMAL and reveals evidence of mild chronic L5 radiculopathy on the left.     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    There is no significant electrodiagnostic evidence of any other focal nerve entrapment or lumbosacral plexopathy.    Recommendations: 1.  Follow-up with referring physician. 2.  Continue current management of symptoms.  COGNITION: Overall cognitive status: Impaired: Areas of impairment:  Memory: Impaired: Immediate Working Short term Long term Awareness: Impaired: Intellectual Executive function: Impaired: Problem solving and Slow processing    SENSATION: Peripheral neuropathy  COORDINATION: Heel-toe WFL Heel to shin impaired  POSTURE:  rounded shoulders, forward head, and increased thoracic kyphosis  LOWER EXTREMITY ROM:    Grossly WFL  LOWER EXTREMITY MMT:  (tested in sitting on eval)  MMT Right eval Left eval  Hip flexion 4- 4-  Hip extension 4- 4-  Hip abduction 4 4  Hip adduction 4 4  Hip internal rotation 4 4  Hip external rotation 4- 4-  Knee flexion 4 4  Knee extension 4 4  Ankle dorsiflexion 4 4  Ankle plantarflexion    Ankle inversion    Ankle eversion    (Blank rows = not tested)  TRANSFERS: Assistive device utilized: None   Sit to stand: Modified independence and SBA Stand to sit: Modified independence and SBA Chair to chair: SBA Floor: NT  GAIT: Distance walked: clinic distances Assistive device utilized: Environmental Consultant - 2 wheeled, Environmental Consultant - 4 wheeled, and None Level of assistance: SBA and CGA Gait pattern: lateral hip instability and trunk flexed with increased sway/staggering with gait w/o AD  STAIRS:  Level of Assistance: CGA  Stair Negotiation Technique: Alternating Pattern on ascent and Step to Pattern on descent Forwards with Single Rail on Right  Number of Stairs: 14   Height of Stairs: 7   FUNCTIONAL TESTS:  5 times sit to stand: 18.43 sec requiring B UE assist Timed up and go (TUG): 22.81 sec w/o AD 10 meter walk test: 15.37 sec w/o AD  Gait speed: 2.13 ft/sec w/o AD  PATIENT SURVEYS:  ABC scale: The Activities-Specific Balance Confidence (ABC) Scale 0% 10 20 30  40 50 60 70 80 90 100% No confidence<->completely confident  How confident are you that you will not lose your balance or become unsteady when you . . .  Date tested 05/31/2024   Walk around the house 30%  2. Walk up or down stairs 50%  3. Bend over and pick up a slipper from in front of a closet floor 40%  4. Reach for a small can off a shelf at eye level 50%  5. Stand on tip toes and reach for something above your head 20%  6. Stand on a chair and reach for something 0%  7. Sweep the floor 40%  8. Walk outside the house to a car parked in the  driveway 40%  9. Get into or out of a car 40%  10. Walk across a parking lot to the mall 20%  11. Walk up or down a ramp 30%  12. Walk in a crowded mall where people rapidly walk past you 30%  13. Are bumped into by people as you walk through the mall 20%  14. Step onto or off of an escalator while you are holding onto the railing 10%  15. Step onto or off an escalator while holding onto parcels such that you cannot hold onto the railing 10%  16. Walk outside on icy sidewalks 0%  Total:  #/16 430 / 1600 = 26.9 %  Level of physical functioning:  Low*  * <50% = low level physical functioning   <67% indicates risk of falls vestibular disorders, older adults   TODAY'S TREATMENT:   06/08/2024  THERAPEUTIC EXERCISE: To improve strength and endurance.  Demonstration, verbal and tactile cues throughout for technique.  NuStep - L3 x 6'  Seated RTB hip flexion march 2 x 10 Seated RTB clam 2 x 10 Seated heel-toe raises 2 x 10 Seated B hip ADD ball squeeze 10 x 5  Seated LAQ 2 x 10 B Sit to Stand  x 10  GAIT TRAINING: To improve safety with RW. 2 x 180' with RW - cues for upright posture and RW proximity as well as safe approach to chair/bed with proper turning and backing to seat  THERAPEUTIC ACTIVITIES: To improve functional performance.  Demonstration, verbal and tactile cues throughout for technique.  Sit to stand transfers focusing on proper hand placement during transition  SELF CARE: Provided education to reduce fall risk and to promote safe home environment.  Reviewed the Check for Safety - Home Fall Prevention Checklist for Older Adults with patient and wife to help identify fall risk hazards in the home along with strategies to reduce fall risk at home   05/31/2024 - Eval SELF CARE:   Reviewed eval findings and role of PT in addressing identified deficits as well as impact on risk for falls.  Gait training 1' each with [phone] and 2WW/RW providing instruction in safe hand placement during transfers, proper use of 4WW brakes, proper height adjustment of walker, good posture and walker proximity during gait and safe approach to destination.  Based on initial gait training, recommended use of 2WW/RW for home and community access to help reduce fall risk.   PATIENT EDUCATION:  Education details: PT eval findings, anticipated POC, standardized testing results and interpretation, and gait safety with 4WW vs 2WW/RW - latter recommended for home use  Person educated: Patient  and Spouse Education method: Explanation, Demonstration, Verbal cues, and Tactile cues Education comprehension: verbalized understanding, returned demonstration, verbal cues required, tactile cues required, and needs further education  HOME EXERCISE PROGRAM: Access Code: [email or web address] URL: [email or web address] Date: 06/08/2024 Prepared by: Elijah Hidden  Exercises - Seated March with Resistance  - 1 x daily - 7 x weekly - 2 sets - 10 reps - 3 sec hold - Seated Hip Abduction with Resistance  - 1 x daily - 7 x weekly - 2 sets - 10 reps - 3 sec hold - Seated Heel Toe Raises  - 1 x daily - 7 x weekly - 2 sets - 10 reps - 3 sec hold - Seated Hip Adduction Isometrics with Ball  - 1 x daily - 7 x weekly - 2 sets - 10 reps - 3-5 sec hold - Seated Long Arc Quad  -  1 x daily - 7 x weekly - 2 sets - 10 reps - 3 sec hold - Sit to Stand  - 1 x daily - 7 x weekly - 2 sets - 10 reps  Patient Education - Check for Safety   ASSESSMENT:  CLINICAL IMPRESSION: Fredric's wife, Barnie, reports they have been using his RW at home but he is not consistent with it yet.  Reviewed safe transfer techniques with RW including proper hand placement for both sit <> stand as well as proper approach and alignment with seat in prep for stand to sit.  Gait training targeting upright posture and proper RW proximity with patient able to step with feet appropriately between rear legs of walker with only occasional cues necessary, most with turning or approach to destination.  Provided education on fall risk prevention, reviewing the Check for Safety - Home Fall Prevention Checklist for Older Adults with Aboubacar and his wife to help identify fall risk hazards in the home along with strategies to reduce fall risk at home.  Remainder of session focusing on establishing an initial HEP, with Barnie mirroring patient with HEP performance in order to better help coach/facilitate performance of HEP at home.  Yahsir will  benefit from continued skilled PT to address ongoing strength, balance and safety awareness deficits to improve mobility and activity tolerance with decreased risk for falls.   EVAL: [name] is a 74 y.o. male who was referred to physical therapy for evaluation and treatment for gait instability, difficulty in walking and memory impairment.  Patient presents with physical impairments of B LE weakness, impaired activity tolerance, impaired standing balance, impaired ambulation, and decreased safety awareness impacting safe and independent functional mobility.  Examination revealed patient is at risk for falls and functional decline as evidenced by the following objective test measures: Gait speed 2.13 ft/sec w/o AD, (2.62 ft/sec is needed for community access), TUG of 22.81 sec w/o AD (>13.5 sec indicates increased risk for falls), and 5xSTS of 18.43 sec with need for B UE assist (>15 sec w/o UE assist indicates increased risk for falls and decreased BLE power).  ABC scale score of 26.9% indicates a low level of physical functioning and score of <67% indicates risk of falls in older adults.  Nathen will benefit from skilled PT to address above deficits to improve mobility and activity tolerance to help reach the maximal level of functional independence and mobility. Patient demonstrates understanding of this POC and is in agreement with this plan.   OBJECTIVE IMPAIRMENTS: Abnormal gait, decreased activity tolerance, decreased balance, decreased cognition, decreased coordination, decreased endurance, decreased knowledge of condition, decreased knowledge of use of DME, decreased mobility, difficulty walking, decreased strength, improper body mechanics, and postural dysfunction.   ACTIVITY LIMITATIONS: standing, stairs, transfers, bed mobility, and locomotion level  PARTICIPATION LIMITATIONS: meal prep, cleaning, laundry, shopping, community activity, and yard work  PERSONAL FACTORS: Age, Behavior  pattern, Fitness, Time since onset of injury/illness/exacerbation, and 3+ comorbidities: Severe cognitive impairment, diagnosis of mixed dementia of unclear etiology, HTN, ventricular tachycardia s/p MI, ICD, cardiomyopathy, chronic heart failure, aortic atherosclerosis, HLD, DM-II with polyneuropathy, OSA, B12 deficiency, LBP s/p fall, urinary incontinence, hospitalization for BPPV 12/2023 - vertigo now resolved, orthostatic hypotension are also affecting patient's functional outcome.   REHAB POTENTIAL: Fair due to degree of cognitive impairment   CLINICAL DECISION MAKING: Unstable/unpredictable  EVALUATION COMPLEXITY: High   GOALS: Goals reviewed with patient? Yes (and wife)  SHORT TERM GOALS: Target date: 06/28/2024  Patient and  wife will be independent with initial HEP to improve outcomes and carryover.  Baseline: TBD Goal status: REVISED - 06/08/24 - Initial HEP provided with wife present during instruction to facilitate home performance  2.  Patient and wife will be educated on strategies to decrease risk of falls.  Baseline:  Goal status: MET - 06/08/24  3.  Patient will demonstrate safe transfers and gait with 2WW/RW to reduce risk for falls. Baseline:  Goal status: IN PROGRESS - 06/08/24  LONG TERM GOALS: Target date: 08/23/2024  Patient and wife will be independent with advanced/ongoing HEP to facilitate ability to maintain/progress functional gains from skilled physical therapy services. Baseline:  Goal status: REVISED - 06/08/24  2.  Patient will be able to ambulate 600' with or w/o LRAD on variable surfaces with good safety to access community.  Baseline:  Goal status: INITIAL  3.  Patient will be able to step up/down curb safely with or w/o LRAD for safety with community ambulation.  Baseline:  Goal status: INITIAL   4.  Patient will demonstrate decreased TUG time to </= 18 sec to decrease risk for falls with transitional mobility. Baseline: 22.81 sec w/o AD Goal  status: INITIAL  5.  Patient will improve 5xSTS time to </= 15 seconds w/o need for UE assist for improved efficiency and safety with transfers. Baseline: 18.43 sec Goal status: INITIAL   6.  Patient will demonstrate gait speed of >/= 2.62 ft/sec (0.8 m/s) with or w/o LRAD to be a safe community ambulator with decreased risk for recurrent falls.  Baseline: 2.13 ft/sec w/o AD Goal status: INITIAL  7.  Patient will improve Berg score to >/= 40/56 to improve safety and stability with ADLs in standing and reduce risk for falls. (MCID= 8 points)  Baseline: 31/56 Goal status: INITIAL  8.  Patient will report >/= 45% on ABC scale (MCID = 19%) to demonstrate improved balance confidence with functional mobility and gait. Baseline: 430 / 1600 = 26.9 % Goal status: INITIAL   PLAN:  PT FREQUENCY: 2x/week  PT DURATION: 12 weeks  PLANNED INTERVENTIONS: 97164- PT Re-evaluation, 97750- Physical Performance Testing, 97110-Therapeutic exercises, 97530- Therapeutic activity, 97112- Neuromuscular re-education, 97535- Self Care, [id number]- Manual therapy, [phone]- Gait training, [phone]- Electrical stimulation (unattended), Patient/Family education, Balance training, Stair training, DME instructions, Cryotherapy, and Moist heat  PLAN FOR NEXT SESSION: encourage wife participation/observation of treatment sessions to promote better home carryover - review safe transfers and gait with RW; functional LE strengthening - review/update initial HEP depending on pt performance; balance training   Elijah CHRISTELLA Hidden, PT 06/08/2024, 10:16 AM  "

## 2024-06-09 ENCOUNTER — Encounter: Payer: Self-pay | Admitting: Internal Medicine

## 2024-06-09 ENCOUNTER — Encounter: Payer: Self-pay | Admitting: Physician Assistant

## 2024-06-13 ENCOUNTER — Ambulatory Visit

## 2024-06-13 DIAGNOSIS — R262 Difficulty in walking, not elsewhere classified: Secondary | ICD-10-CM

## 2024-06-13 DIAGNOSIS — R2681 Unsteadiness on feet: Secondary | ICD-10-CM

## 2024-06-13 DIAGNOSIS — M6281 Muscle weakness (generalized): Secondary | ICD-10-CM

## 2024-06-13 DIAGNOSIS — Z9181 History of falling: Secondary | ICD-10-CM

## 2024-06-14 ENCOUNTER — Other Ambulatory Visit: Payer: Self-pay | Admitting: Internal Medicine

## 2024-06-15 ENCOUNTER — Ambulatory Visit: Admitting: Physical Therapy

## 2024-06-15 ENCOUNTER — Ambulatory Visit

## 2024-06-15 ENCOUNTER — Other Ambulatory Visit: Payer: Self-pay | Admitting: Internal Medicine

## 2024-06-15 ENCOUNTER — Encounter: Payer: Self-pay | Admitting: Physical Therapy

## 2024-06-15 DIAGNOSIS — M6281 Muscle weakness (generalized): Secondary | ICD-10-CM

## 2024-06-15 DIAGNOSIS — Z9181 History of falling: Secondary | ICD-10-CM

## 2024-06-15 DIAGNOSIS — R2681 Unsteadiness on feet: Secondary | ICD-10-CM

## 2024-06-15 DIAGNOSIS — R262 Difficulty in walking, not elsewhere classified: Secondary | ICD-10-CM

## 2024-06-15 NOTE — Therapy (Signed)
 " OUTPATIENT PHYSICAL THERAPY TREATMENT   Patient Name: [name] MRN: [id number] DOB:1950-08-15, 74 y.o., male Today's Date: 06/15/2024   END OF SESSION:  PT End of Session - 06/15/24 0935     Visit Number 4    Date for Recertification  08/23/24    Authorization Type [employer] MCR    Authorization Time Period 05/31/24 - 07/20/24    Authorization - Visit Number 4    Authorization - Number of Visits 10    Progress Note Due on Visit 10    PT Start Time 0935   Pt arrived late   PT Stop Time 1014    PT Time Calculation (min) 39 min    Activity Tolerance Patient tolerated treatment well    Behavior During Therapy [facility] for tasks assessed/performed             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   Not compatible with MRI    Barrett's esophagus 09/05/2004   2 cm changes seen at index EGD   Cataract    Chronic back pain 04/25/2019   Chronic systolic heart failure    Colon polyps    tubular adenoma   Diabetic neuropathy 01/14/2022   Difficulty in walking 10/02/2021   Dizziness 01/19/2011   Dysrhythmia    Essential hypertension 08/26/2009   Fatigue 12/02/2020   Generalized anxiety disorder    since defibrillator placement   GERD (gastroesophageal reflux disease)    Hip pain 12/14/2016   History of COVID-19    History of myocardial infarction    Hyperlipidemia    Left knee pain 12/14/2016   Low back pain 05/14/2021   Malignant neoplasm of prostate 09/27/2014   s/p prostatectomy   Mild anemia 01/20/2011   Mixed dementia 04/27/2022   Neuralgia 10/19/2018   NICM (nonischemic cardiomyopathy)    a.normal cors by cath in 2005. b. s/p Medtronic ICD.   OSA (obstructive sleep apnea) 11/10/2012   regular CPAP use   Pain due to onychomycosis of toenails of both feet 02/23/2022   Pain in the coccyx 01/28/2022   Rectal pain 12/21/2021   Right foot  pain 12/15/2017   RLS (restless legs syndrome) 11/29/2012   S/P radiation therapy    10/10/2014 through 11/26/2014; Prostate bed 6600 cGy in 33 sessions   Shingles 07/28/2016   TIA (transient ischemic attack) 01/05/2022   Type II diabetes mellitus 08/26/2009   Ventricular tachycardia    a. s/p ICD (generator change 09/2012). b. 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 and placed on sotalol    Visual hallucinations    Vitamin B12 deficiency 03/06/2011   Monthly B12 shots initiated  2011. Nasally  inhaled B12 as of 07/2012   Past Surgical History:  Procedure Laterality Date   CARDIAC DEFIBRILLATOR PLACEMENT  03/11/2004   Medtronic Maximo single lead   CATARACT EXTRACTION     COLONOSCOPY W/ BIOPSIES AND POLYPECTOMY  08/2004, 02/24/11   6mm adenoma in 2006,  5 mm polyp (not recovered) 2012   CYSTOSCOPY WITH DIRECT VISION INTERNAL URETHROTOMY N/A 10/08/2015   Procedure: CYSTOSCOPY WITH DIRECT VISION INTERNAL URETHROTOMY;  Surgeon: Redell Lynwood Napoleon, MD;  Location: [facility];  Service: Urology;  Laterality: N/A;   CYSTOSCOPY WITH URETHRAL DILATATION N/A 04/15/2023   Procedure: CYSTOSCOPY AND ROSELEE WITH OPTILUME URETHRAL DILATATION;  Surgeon: Cam Morene ORN, MD;  Location: [facility];  Service: Urology;  Laterality: N/A;  60 MINUTES   IMPLANTABLE CARDIOVERTER DEFIBRILLATOR GENERATOR CHANGE N/A 10/07/2012   Procedure: IMPLANTABLE CARDIOVERTER DEFIBRILLATOR GENERATOR CHANGE;  Surgeon: Danelle LELON Birmingham, MD;  Location: [facility] CATH LAB;  Service: Cardiovascular;  Laterality: N/A;   PROSTATECTOMY  06/12/2003   Dr Aleene   RECTAL SURGERY     UPPER GASTROINTESTINAL ENDOSCOPY  08/2004, 02/24/11   Barrett's esophagus   Patient Active Problem List   Diagnosis Date Noted   Diabetes mellitus treated with oral medication (HCC) 01/13/2024   Vertigo 01/04/2024   Lightheadedness 10/15/2023   Gross hematuria 02/09/2023   Diarrhea 07/14/2022   Poor balance 07/14/2022   Lumbosacral radiculopathy at L5 07/14/2022   Mixed dementia  04/27/2022   Visual hallucinations    Pain due to onychomycosis of toenails of both feet 02/23/2022   Pain in the coccyx 01/28/2022   Diabetic neuropathy 01/14/2022   History of TIA (transient ischemic attack) 01/05/2022   Rectal pain 12/21/2021   Difficulty in walking 10/02/2021   Low back pain 05/14/2021   Generalized anxiety disorder 04/18/2021   Dysrhythmia 04/18/2021   History of myocardial infarction 04/18/2021   History of COVID-19    Aortic atherosclerosis 12/05/2020   Fatigue 12/02/2020   Shingles 07/28/2016   GERD (gastroesophageal reflux disease) 07/29/2015   Malignant neoplasm of prostate 09/27/2014   RLS (restless legs syndrome) 11/29/2012   OSA (obstructive sleep apnea) 11/10/2012   Vitamin B12 deficiency 03/06/2011   Chronic systolic heart failure 01/27/2011   Mild anemia 01/20/2011   Dizziness 01/19/2011   History of ventricular tachycardia    NICM (nonischemic cardiomyopathy)    Type II diabetes mellitus 08/26/2009   Hyperlipidemia associated with type 2 diabetes mellitus (HCC) 08/26/2009   Hypertension associated with type 2 diabetes mellitus (HCC) 08/26/2009   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   Barrett's esophagus 09/05/2004    PCP: [name], MD   REFERRING PROVIDER: Dina Camie BRAVO, PA-C   REFERRING DIAG:  R26.81 (ICD-10-CM) - Gait instability  R26.2 (ICD-10-CM) - Difficulty in walking  R41.3 (ICD-10-CM) - Memory impairment   THERAPY DIAG:  Unsteadiness on feet  History of falling  Muscle weakness (generalized)  Difficulty in walking, not elsewhere classified  RATIONALE FOR EVALUATION AND TREATMENT: Rehabilitation  ONSET DATE: Worsening over the last 6 months  NEXT MD VISIT: 07/18/2024   SUBJECTIVE:  SUBJECTIVE STATEMENT: Pt's wife reports limited compliance with HEP but denies any issues with the exercises.  She verbalizes intent to try for more consistent performance of HEP.     EVAL: Pt referred for increasing gait instability, getting worse over the last 6 months.  Testing revealed neuropathy in his feet and legs.  His wife reports 1 fall last week when he missed the chair while sitting down, landing on his R shoulder - sore the next day but no further c/o.  He does not use any AD currently but wife reports she feels like she needs to hold onto to him.    Pt accompanied by: significant other - wife [name])  PAIN: Are you having pain? No  PERTINENT HISTORY:  Severe cognitive impairment (as indicated neuropsych eval below), diagnosis of mixed dementia of unclear etiology, HTN, ventricular tachycardia s/p MI, ICD, cardiomyopathy, chronic heart failure, aortic atherosclerosis, HLD, DM-II with polyneuropathy, OSA, B12 deficiency, LBP s/p fall, urinary incontinence, hospitalization for BPPV 12/2023 - vertigo now resolved, orthostatic hypotension  Neuropsych evaluation 05/2023  Briefly, results suggested diffuse and generally quite severe cognitive impairment. Said impairment was exhibited across processing speed, complex attention, executive functioning, receptive language, confrontation naming, visuospatial abilities (outside of his figure copy), and essentially all aspects of learning and memory. Relative to his previous evaluations in December 2022 and December 2023, further decline was exhibited across essentially all aspects of memory. This was particularly noteworthy across yes/no recognition trials, as performance worsened across all tasks relative to his initial 2022 evaluation, suggesting a greater storage impairment. The underlying etiology continues to be difficult to pinpoint. 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 Neurologically  speaking, the presence of an underlying neurodegenerative illness continues to appear likely. Differentiating this illness continues to be challenging due to largely diffuse cognitive impairment.  While underlying Alzheimer's disease may be most likely given memory performances and population base rates, I am unable to conclusively determine this as there remains much symptom overlap between conditions (e.g., this and Lewy body disease and/or vascular contributions). Continued medical monitoring will be important moving forward.   PRECAUTIONS: Fall and ICD/Pacemaker   RED FLAGS: None  WEIGHT BEARING RESTRICTIONS: No  FALLS:  Has patient fallen in last 6 months? Yes. Number of falls 1  LIVING ENVIRONMENT: Lives with: lives with their spouse Lives in: House Stairs: Yes: Internal: 14 steps; on right going up, on left going up, and can reach both and External: 3-4 steps; on right going up, on left going up, and bilateral but cannot reach both Has following equipment at home: Single point cane, Walker - 2 wheeled, Walker - 4 wheeled, shower chair, bed side commode, and Grab bars  OCCUPATION: Retired  PLOF: Needs assistance with ADLs, Needs assistance with homemaking, and Leisure: Mostly mostly sedentary recently but used to play tennis Patient is able to participate on ADLs although he needs more assistance (esp with LB dressing).  His mobility has decreased due to deconditioning.  He no longer drives.    PATIENT GOALS: To feel more steady.   OBJECTIVE: (objective measures completed at initial evaluation unless otherwise dated)  DIAGNOSTIC FINDINGS:  12/18/21 - EMG & NCV Findings: Evaluation of the left tibial motor and the right superficial fibular sensory nerves showed reduced amplitude (L0.3, R2.4 V).  The right tibial motor nerve showed prolonged distal onset latency (6.3 ms) and reduced amplitude (0.3 mV).  The left superficial fibular sensory nerve showed prolonged distal peak latency  (  4.8 ms) and decreased conduction velocity (14 cm-Ant Lat Mall, 29 m/s).  The left sural sensory and the right sural sensory nerves showed no response (Calf).  All remaining nerves (as indicated in the following tables) were within normal limits.  Left vs. Right side comparison data for the tibial motor nerve indicates abnormal L-R latency difference (1.8 ms) and abnormal L-R velocity difference (Knee-Ankle, 11 m/s).     Needle evaluation of the left anterior tibialis and the left Fibularis Longus muscles showed increased insertional activity.  All remaining muscles (as indicated in the following table) showed no evidence of electrical instability.     Impression: The above electrodiagnostic study is ABNORMAL and reveals evidence of mild chronic L5 radiculopathy on the left.     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    There is no significant electrodiagnostic evidence of any other focal nerve entrapment or lumbosacral plexopathy.    Recommendations: 1.  Follow-up with referring physician. 2.  Continue current management of symptoms.  COGNITION: Overall cognitive status: Impaired: Areas of impairment:  Memory: Impaired: Immediate Working Short term Long term Awareness: Impaired: Intellectual Executive function: Impaired: Problem solving and Slow processing    SENSATION: Peripheral neuropathy  COORDINATION: Heel-toe WFL Heel to shin impaired  POSTURE:  rounded shoulders, forward head, and increased thoracic kyphosis  LOWER EXTREMITY ROM:    Grossly WFL  LOWER EXTREMITY MMT:  (tested in sitting on eval)  MMT Right eval Left eval  Hip flexion 4- 4-  Hip extension 4- 4-  Hip abduction 4 4  Hip adduction 4 4  Hip internal rotation 4 4  Hip external rotation 4- 4-  Knee flexion 4 4  Knee extension 4 4  Ankle dorsiflexion 4 4  Ankle plantarflexion    Ankle inversion    Ankle eversion     (Blank rows = not tested)  TRANSFERS: Assistive device utilized: None  Sit to stand: Modified independence and SBA Stand to sit: Modified independence and SBA Chair to chair: SBA Floor: NT  GAIT: Distance walked: clinic distances Assistive device utilized: Environmental Consultant - 2 wheeled, Environmental Consultant - 4 wheeled, and None Level of assistance: SBA and CGA Gait pattern: lateral hip instability and trunk flexed with increased sway/staggering with gait w/o AD  STAIRS:  Level of Assistance: CGA  Stair Negotiation Technique: Alternating Pattern on ascent and Step to Pattern on descent Forwards with Single Rail on Right  Number of Stairs: 14   Height of Stairs: 7   FUNCTIONAL TESTS:  5 times sit to stand: 18.43 sec requiring B UE assist Timed up and go (TUG): 22.81 sec w/o AD 10 meter walk test: 15.37 sec w/o AD  Gait speed: 2.13 ft/sec w/o AD  PATIENT SURVEYS:  ABC scale: The Activities-Specific Balance Confidence (ABC) Scale 0% 10 20 30  40 50 60 70 80 90 100% No confidence<->completely confident  How confident are you that you will not lose your balance or become unsteady when you . . .  Date tested 05/31/2024   Walk around the house 30%  2. Walk up or down stairs 50%  3. Bend over and pick up a slipper from in front of a closet floor 40%  4. Reach for a small can off a shelf at eye level 50%  5. Stand on tip toes and reach for something above your head 20%  6. Stand on a chair and reach for something 0%  7. Sweep  the floor 40%  8. Walk outside the house to a car parked in the driveway 40%  9. Get into or out of a car 40%  10. Walk across a parking lot to the mall 20%  11. Walk up or down a ramp 30%  12. Walk in a crowded mall where people rapidly walk past you 30%  13. Are bumped into by people as you walk through the mall 20%  14. Step onto or off of an escalator while you are holding onto the railing 10%  15. Step onto or off an escalator while holding onto parcels such that you cannot  hold onto the railing 10%  16. Walk outside on icy sidewalks 0%  Total: #/16 430 / 1600 = 26.9 %  Level of physical functioning:  Low*  * <50% = low level physical functioning   <67% indicates risk of falls vestibular disorders, older adults   TODAY'S TREATMENT:    06/15/2024  THERAPEUTIC EXERCISE: To improve strength and endurance.  Demonstration, verbal and tactile cues throughout for technique.  NuStep - L4 x 6' (B UE/LE)  GAIT TRAINING: To normalize gait pattern and improve safety with RW.  220' with RW - cues to keep walker in contact with the ground as well as for foot placement between rear legs of walker, esp while turning to maintain good RW proximity Obstacle negotiation with RW: Figure-8 around cones [place] around multiple cones  THERAPEUTIC ACTIVITIES: To improve functional performance.  Demonstration, verbal and tactile cues throughout for technique.  STS x 10 - cues for alignment and proper hand placement during transition Worked on safe approach to chair vs mat table to prepare for stand to sit   06/13/24 Nustep L3x42min UE/LE Seated LAQ x 10 BLE Seated hip ABD RTB X 10 Seated marching RTB X 10 Seated heel raises x 10 B  Sit to stand x 10  Standing hip abduction BLE x 10 Standing hip extension BLE x 10 Standing marching BLE x 10  Sidestepping along counter 3x down and back Standing heel raise at counter x 10  Gait with RW150 ft- cues for directions and to correct gait pattern   06/08/2024  THERAPEUTIC EXERCISE: To improve strength and endurance.  Demonstration, verbal and tactile cues throughout for technique.  NuStep - L3 x 6'  Seated RTB hip flexion march 2 x 10 Seated RTB clam 2 x 10 Seated heel-toe raises 2 x 10 Seated B hip ADD ball squeeze 10 x 5  Seated LAQ 2 x 10 B Sit to Stand  x 10  GAIT TRAINING: To improve safety with RW. 2 x 180' with RW - cues for upright posture and RW proximity as well as safe approach to chair/bed with proper  turning and backing to seat  THERAPEUTIC ACTIVITIES: To improve functional performance.  Demonstration, verbal and tactile cues throughout for technique.  Sit to stand transfers focusing on proper hand placement during transition  SELF CARE: Provided education to reduce fall risk and to promote safe home environment.  Reviewed the Check for Safety - Home Fall Prevention Checklist for Older Adults with patient and wife to help identify fall risk hazards in the home along with strategies to reduce fall risk at home   05/31/2024 - Eval SELF CARE:   Reviewed eval findings and role of PT in addressing identified deficits as well as impact on risk for falls.  Gait training 27' each with [phone] and 2WW/RW providing instruction in safe hand placement during transfers,  proper use of 4WW brakes, proper height adjustment of walker, good posture and walker proximity during gait and safe approach to destination.  Based on initial gait training, recommended use of 2WW/RW for home and community access to help reduce fall risk.   PATIENT EDUCATION:  Education details: PT eval findings, anticipated POC, standardized testing results and interpretation, and gait safety with 4WW vs 2WW/RW - latter recommended for home use  Person educated: Patient and Spouse Education method: Explanation, Demonstration, Verbal cues, and Tactile cues Education comprehension: verbalized understanding, returned demonstration, verbal cues required, tactile cues required, and needs further education  HOME EXERCISE PROGRAM: Access Code: [email or web address] URL: [email or web address] Date: 06/13/2024 Prepared by: Braylin Clark  Exercises - Seated March with Resistance  - 1 x daily - 7 x weekly - 2 sets - 10 reps - 3 sec hold - Seated Hip Abduction with Resistance  - 1 x daily - 7 x weekly - 2 sets - 10 reps - 3 sec hold - Seated Heel Toe Raises  - 1 x daily - 7 x weekly - 2 sets - 10 reps - 3 sec hold - Seated Hip Adduction  Isometrics with Ball  - 1 x daily - 7 x weekly - 2 sets - 10 reps - 3-5 sec hold - Seated Long Arc Quad  - 1 x daily - 7 x weekly - 2 sets - 10 reps - 3 sec hold - Sit to Stand  - 1 x daily - 7 x weekly - 2 sets - 10 reps - Standing Hip Abduction with Counter Support  - 1 x daily - 7 x weekly - 2 sets - 10 reps - Standing Hip Extension with Counter Support  - 1 x daily - 7 x weekly - 2 sets - 10 reps - Heel Raises with Counter Support  - 1 x daily - 7 x weekly - 2 sets - 10 reps  Patient Education - Check for Safety   ASSESSMENT:  CLINICAL IMPRESSION: Today's session focusing on RW safety with transfers and gait.  [name] is able to demonstrate good RW proximity and control with straight pathway ambulation but assistance still needed with directional control for turning and obstacle negotiation as well as approach to chair.  His wife continues to observe and participate during therapy sessions to help promote carryover at home, but admits to limited recent compliance with HEP performance.  [name] will benefit from continued skilled PT to address ongoing strength, balance and safety awareness deficits to improve mobility and activity tolerance with decreased risk for falls.   EVAL: [name] is a 74 y.o. male who was referred to physical therapy for evaluation and treatment for gait instability, difficulty in walking and memory impairment.  Patient presents with physical impairments of B LE weakness, impaired activity tolerance, impaired standing balance, impaired ambulation, and decreased safety awareness impacting safe and independent functional mobility.  Examination revealed patient is at risk for falls and functional decline as evidenced by the following objective test measures: Gait speed 2.13 ft/sec w/o AD, (2.62 ft/sec is needed for community access), TUG of 22.81 sec w/o AD (>13.5 sec indicates increased risk for falls), and 5xSTS of 18.43 sec with need for B UE assist (>15 sec w/o UE  assist indicates increased risk for falls and decreased BLE power).  ABC scale score of 26.9% indicates a low level of physical functioning and score of <67% indicates risk of falls in older adults.  [name] will benefit from skilled PT to  address above deficits to improve mobility and activity tolerance to help reach the maximal level of functional independence and mobility. Patient demonstrates understanding of this POC and is in agreement with this plan.   OBJECTIVE IMPAIRMENTS: Abnormal gait, decreased activity tolerance, decreased balance, decreased cognition, decreased coordination, decreased endurance, decreased knowledge of condition, decreased knowledge of use of DME, decreased mobility, difficulty walking, decreased strength, improper body mechanics, and postural dysfunction.   ACTIVITY LIMITATIONS: standing, stairs, transfers, bed mobility, and locomotion level  PARTICIPATION LIMITATIONS: meal prep, cleaning, laundry, shopping, community activity, and yard work  PERSONAL FACTORS: Age, Behavior pattern, Fitness, Time since onset of injury/illness/exacerbation, and 3+ comorbidities: Severe cognitive impairment, diagnosis of mixed dementia of unclear etiology, HTN, ventricular tachycardia s/p MI, ICD, cardiomyopathy, chronic heart failure, aortic atherosclerosis, HLD, DM-II with polyneuropathy, OSA, B12 deficiency, LBP s/p fall, urinary incontinence, hospitalization for BPPV 12/2023 - vertigo now resolved, orthostatic hypotension are also affecting patient's functional outcome.   REHAB POTENTIAL: Fair due to degree of cognitive impairment   CLINICAL DECISION MAKING: Unstable/unpredictable  EVALUATION COMPLEXITY: High   GOALS: Goals reviewed with patient? Yes (and wife)  SHORT TERM GOALS: Target date: 06/28/2024  Patient and wife will be independent with initial HEP to improve outcomes and carryover.  Baseline: TBD 06/08/24 - Initial HEP provided with wife present during instruction to  facilitate home performance Goal status: IN PROGRESS - 06/15/24 - Pt's wife denies any issues with HEP but admits to limited compliance with home performance  2.  Patient and wife will be educated on strategies to decrease risk of falls.  Baseline:  Goal status: MET - 06/08/24  3.  Patient will demonstrate safe transfers and gait with 2WW/RW to reduce risk for falls. Baseline:  Goal status: IN PROGRESS - 06/15/24 - good RW proximity with straight pathway but assistance still needed with directional control for turning and obstacle negotiation as well as approach to chair  LONG TERM GOALS: Target date: 08/23/2024  Patient and wife will be independent with advanced/ongoing HEP to facilitate ability to maintain/progress functional gains from skilled physical therapy services. Baseline:  Goal status: REVISED - 06/08/24  2.  Patient will be able to ambulate 600' with or w/o LRAD on variable surfaces with good safety to access community.  Baseline:  Goal status: INITIAL  3.  Patient will be able to step up/down curb safely with or w/o LRAD for safety with community ambulation.  Baseline:  Goal status: INITIAL   4.  Patient will demonstrate decreased TUG time to </= 18 sec to decrease risk for falls with transitional mobility. Baseline: 22.81 sec w/o AD Goal status: INITIAL  5.  Patient will improve 5xSTS time to </= 15 seconds w/o need for UE assist for improved efficiency and safety with transfers. Baseline: 18.43 sec Goal status: INITIAL   6.  Patient will demonstrate gait speed of >/= 2.62 ft/sec (0.8 m/s) with or w/o LRAD to be a safe community ambulator with decreased risk for recurrent falls.  Baseline: 2.13 ft/sec w/o AD Goal status: INITIAL  7.  Patient will improve Berg score to >/= 40/56 to improve safety and stability with ADLs in standing and reduce risk for falls. (MCID= 8 points)  Baseline: 31/56 Goal status: INITIAL  8.  Patient will report >/= 45% on ABC scale (MCID = 19%)  to demonstrate improved balance confidence with functional mobility and gait. Baseline: 430 / 1600 = 26.9 % Goal status: INITIAL   PLAN:  PT FREQUENCY: 2x/week  PT DURATION:  12 weeks  PLANNED INTERVENTIONS: 97164- PT Re-evaluation, 97750- Physical Performance Testing, 97110-Therapeutic exercises, 97530- Therapeutic activity, 97112- Neuromuscular re-education, [phone]- Self Care, [id number]- Manual therapy, [phone]- Gait training, [phone]- Electrical stimulation (unattended), Patient/Family education, Balance training, Stair training, DME instructions, Cryotherapy, and Moist heat  PLAN FOR NEXT SESSION: encourage wife participation/observation of treatment sessions to promote better home carryover - review safe transfers and gait with RW; functional LE strengthening - review/update initial HEP depending on pt performance; balance training   Elijah CHRISTELLA Hidden, PT 06/15/2024, 10:15 AM  "

## 2024-06-16 LAB — CUP PACEART REMOTE DEVICE CHECK
Battery Remaining Longevity: 5 mo
Battery Voltage: 2.85 V
Brady Statistic RV Percent Paced: 0.02 %
Date Time Interrogation Session: 20260205022705
HighPow Impedance: 60 Ohm
Implantable Lead Connection Status: 753985
Implantable Lead Implant Date: 20140530
Implantable Lead Location: 753860
Implantable Lead Model: 6935
Implantable Pulse Generator Implant Date: 20140530
Lead Channel Impedance Value: 342 Ohm
Lead Channel Impedance Value: 399 Ohm
Lead Channel Pacing Threshold Amplitude: 0.625 V
Lead Channel Pacing Threshold Pulse Width: 0.4 ms
Lead Channel Sensing Intrinsic Amplitude: 6.625 mV
Lead Channel Sensing Intrinsic Amplitude: 6.625 mV
Lead Channel Setting Pacing Amplitude: 2.5 V
Lead Channel Setting Pacing Pulse Width: 0.4 ms
Lead Channel Setting Sensing Sensitivity: 0.3 mV

## 2024-06-20 ENCOUNTER — Ambulatory Visit: Admitting: Physical Therapy

## 2024-06-22 ENCOUNTER — Ambulatory Visit

## 2024-06-27 ENCOUNTER — Ambulatory Visit: Admitting: Physical Therapy

## 2024-07-17 ENCOUNTER — Ambulatory Visit

## 2024-07-18 ENCOUNTER — Ambulatory Visit: Payer: Self-pay | Admitting: Physician Assistant

## 2024-07-21 ENCOUNTER — Encounter: Admitting: Internal Medicine

## 2024-08-14 ENCOUNTER — Ambulatory Visit: Admitting: Podiatry

## 2024-08-17 ENCOUNTER — Ambulatory Visit

## 2024-09-14 ENCOUNTER — Ambulatory Visit

## 2024-10-03 ENCOUNTER — Ambulatory Visit

## 2024-12-14 ENCOUNTER — Ambulatory Visit

## 2025-03-15 ENCOUNTER — Ambulatory Visit

## 2025-06-14 ENCOUNTER — Ambulatory Visit
